# Patient Record
Sex: Female | Born: 1960 | Race: Black or African American | Hispanic: No | Smoking: Never smoker
Health system: Southern US, Community
[De-identification: ages and names within clinical notes are randomized; demographics above are authoritative.]

## PROBLEM LIST (undated history)

## (undated) DIAGNOSIS — E119 Type 2 diabetes mellitus without complications: Secondary | ICD-10-CM

## (undated) DIAGNOSIS — G971 Other reaction to spinal and lumbar puncture: Secondary | ICD-10-CM

## (undated) DIAGNOSIS — Z9861 Coronary angioplasty status: Secondary | ICD-10-CM

## (undated) DIAGNOSIS — T7840XA Allergy, unspecified, initial encounter: Secondary | ICD-10-CM

## (undated) DIAGNOSIS — K219 Gastro-esophageal reflux disease without esophagitis: Secondary | ICD-10-CM

## (undated) DIAGNOSIS — D649 Anemia, unspecified: Secondary | ICD-10-CM

## (undated) DIAGNOSIS — J302 Other seasonal allergic rhinitis: Secondary | ICD-10-CM

## (undated) DIAGNOSIS — K222 Esophageal obstruction: Secondary | ICD-10-CM

## (undated) DIAGNOSIS — I219 Acute myocardial infarction, unspecified: Secondary | ICD-10-CM

## (undated) DIAGNOSIS — K5790 Diverticulosis of intestine, part unspecified, without perforation or abscess without bleeding: Secondary | ICD-10-CM

## (undated) DIAGNOSIS — I10 Essential (primary) hypertension: Secondary | ICD-10-CM

## (undated) DIAGNOSIS — I2510 Atherosclerotic heart disease of native coronary artery without angina pectoris: Secondary | ICD-10-CM

## (undated) DIAGNOSIS — R569 Unspecified convulsions: Secondary | ICD-10-CM

## (undated) DIAGNOSIS — G43909 Migraine, unspecified, not intractable, without status migrainosus: Secondary | ICD-10-CM

## (undated) DIAGNOSIS — J42 Unspecified chronic bronchitis: Secondary | ICD-10-CM

## (undated) DIAGNOSIS — E785 Hyperlipidemia, unspecified: Secondary | ICD-10-CM

## (undated) DIAGNOSIS — M1990 Unspecified osteoarthritis, unspecified site: Secondary | ICD-10-CM

## (undated) DIAGNOSIS — K449 Diaphragmatic hernia without obstruction or gangrene: Secondary | ICD-10-CM

## (undated) DIAGNOSIS — J329 Chronic sinusitis, unspecified: Secondary | ICD-10-CM

## (undated) DIAGNOSIS — I252 Old myocardial infarction: Secondary | ICD-10-CM

## (undated) DIAGNOSIS — G4733 Obstructive sleep apnea (adult) (pediatric): Secondary | ICD-10-CM

## (undated) HISTORY — DX: Esophageal obstruction: K22.2

## (undated) HISTORY — DX: Obstructive sleep apnea (adult) (pediatric): G47.33

## (undated) HISTORY — DX: Allergy, unspecified, initial encounter: T78.40XA

## (undated) HISTORY — DX: Essential (primary) hypertension: I10

## (undated) HISTORY — PX: SINOSCOPY: SHX187

## (undated) HISTORY — DX: Old myocardial infarction: I25.2

## (undated) HISTORY — DX: Diaphragmatic hernia without obstruction or gangrene: K44.9

## (undated) HISTORY — DX: Diverticulosis of intestine, part unspecified, without perforation or abscess without bleeding: K57.90

## (undated) HISTORY — PX: COLONOSCOPY: SHX174

## (undated) HISTORY — PX: NASAL SEPTOPLASTY W/ TURBINOPLASTY: SHX2070

---

## 1987-06-30 HISTORY — PX: TUBAL LIGATION: SHX77

## 1992-06-29 HISTORY — PX: ABDOMINAL HYSTERECTOMY: SHX81

## 1999-10-15 ENCOUNTER — Ambulatory Visit: Admission: RE | Admit: 1999-10-15 | Discharge: 1999-10-15 | Payer: Self-pay | Admitting: Family Medicine

## 2002-08-30 ENCOUNTER — Other Ambulatory Visit: Admission: RE | Admit: 2002-08-30 | Discharge: 2002-08-30 | Payer: Self-pay | Admitting: Family Medicine

## 2002-09-01 ENCOUNTER — Ambulatory Visit (HOSPITAL_COMMUNITY): Admission: RE | Admit: 2002-09-01 | Discharge: 2002-09-01 | Payer: Self-pay | Admitting: Family Medicine

## 2003-10-25 ENCOUNTER — Other Ambulatory Visit: Admission: RE | Admit: 2003-10-25 | Discharge: 2003-10-25 | Payer: Self-pay | Admitting: Family Medicine

## 2003-11-01 ENCOUNTER — Ambulatory Visit (HOSPITAL_COMMUNITY): Admission: RE | Admit: 2003-11-01 | Discharge: 2003-11-01 | Payer: Self-pay | Admitting: Internal Medicine

## 2004-04-10 ENCOUNTER — Emergency Department (HOSPITAL_COMMUNITY): Admission: EM | Admit: 2004-04-10 | Discharge: 2004-04-10 | Payer: Self-pay | Admitting: Emergency Medicine

## 2004-05-01 ENCOUNTER — Ambulatory Visit: Payer: Self-pay | Admitting: Nurse Practitioner

## 2004-11-28 ENCOUNTER — Ambulatory Visit (HOSPITAL_COMMUNITY): Admission: RE | Admit: 2004-11-28 | Discharge: 2004-11-28 | Payer: Self-pay | Admitting: Family Medicine

## 2005-08-06 ENCOUNTER — Emergency Department (HOSPITAL_COMMUNITY): Admission: EM | Admit: 2005-08-06 | Discharge: 2005-08-06 | Payer: Self-pay | Admitting: Family Medicine

## 2006-01-20 ENCOUNTER — Ambulatory Visit: Payer: Self-pay | Admitting: *Deleted

## 2006-01-20 ENCOUNTER — Ambulatory Visit: Payer: Self-pay | Admitting: Nurse Practitioner

## 2006-01-29 ENCOUNTER — Ambulatory Visit: Payer: Self-pay | Admitting: Nurse Practitioner

## 2006-10-28 ENCOUNTER — Encounter: Payer: Self-pay | Admitting: Internal Medicine

## 2007-03-10 ENCOUNTER — Ambulatory Visit: Payer: Self-pay | Admitting: Internal Medicine

## 2007-03-10 LAB — CONVERTED CEMR LAB
ALT: 13 units/L (ref 0–35)
AST: 15 units/L (ref 0–37)
Albumin: 3.9 g/dL (ref 3.5–5.2)
Alkaline Phosphatase: 52 units/L (ref 39–117)
BUN: 9 mg/dL (ref 6–23)
Bacteria, UA: NEGATIVE
Basophils Absolute: 0.1 10*3/uL (ref 0.0–0.1)
Basophils Relative: 0.9 % (ref 0.0–1.0)
Bilirubin Urine: NEGATIVE
Bilirubin, Direct: 0.1 mg/dL (ref 0.0–0.3)
CO2: 26 meq/L (ref 19–32)
Calcium: 9.4 mg/dL (ref 8.4–10.5)
Chloride: 108 meq/L (ref 96–112)
Cholesterol: 269 mg/dL (ref 0–200)
Creatinine, Ser: 0.6 mg/dL (ref 0.4–1.2)
Crystals: NEGATIVE
Direct LDL: 206.1 mg/dL
Eosinophils Absolute: 0.2 10*3/uL (ref 0.0–0.6)
Eosinophils Relative: 2.7 % (ref 0.0–5.0)
GFR calc Af Amer: 139 mL/min
GFR calc non Af Amer: 115 mL/min
Glucose, Bld: 101 mg/dL — ABNORMAL HIGH (ref 70–99)
HCT: 34.8 % — ABNORMAL LOW (ref 36.0–46.0)
HDL: 39.4 mg/dL (ref >39.0)
Hemoglobin: 11.9 g/dL — ABNORMAL LOW (ref 12.0–15.0)
Ketones, ur: NEGATIVE mg/dL
Leukocytes, UA: NEGATIVE
Lymphocytes Relative: 32.7 % (ref 12.0–46.0)
MCHC: 34 g/dL (ref 30.0–36.0)
MCV: 83.4 fL (ref 78.0–100.0)
Monocytes Absolute: 0.6 10*3/uL (ref 0.2–0.7)
Monocytes Relative: 8.6 % (ref 3.0–11.0)
Mucus, UA: NEGATIVE
Neutro Abs: 3.7 10*3/uL (ref 1.4–7.7)
Neutrophils Relative %: 55.1 % (ref 43.0–77.0)
Nitrite: NEGATIVE
Platelets: 256 10*3/uL (ref 150–400)
Potassium: 3.8 meq/L (ref 3.5–5.1)
RBC: 4.18 M/uL (ref 3.87–5.11)
RDW: 12.8 % (ref 11.5–14.6)
Sodium: 139 meq/L (ref 135–145)
Specific Gravity, Urine: 1.01 (ref 1.000–1.03)
TSH: 2.09 microintl units/mL (ref 0.35–5.50)
Total Bilirubin: 1.3 mg/dL — ABNORMAL HIGH (ref 0.3–1.2)
Total CHOL/HDL Ratio: 6.8
Total Protein, Urine: NEGATIVE mg/dL
Total Protein: 7.2 g/dL (ref 6.0–8.3)
Triglycerides: 134 mg/dL (ref 0–149)
Urine Glucose: NEGATIVE mg/dL
Urobilinogen, UA: 1 (ref 0.0–1.0)
VLDL: 27 mg/dL (ref 0–40)
WBC, UA: NONE SEEN cells/hpf
WBC: 6.8 10*3/uL (ref 4.5–10.5)
pH: 6.5 (ref 5.0–8.0)

## 2007-03-16 ENCOUNTER — Encounter (INDEPENDENT_AMBULATORY_CARE_PROVIDER_SITE_OTHER): Payer: Self-pay | Admitting: *Deleted

## 2007-03-16 ENCOUNTER — Ambulatory Visit: Payer: Self-pay | Admitting: Internal Medicine

## 2007-03-16 DIAGNOSIS — G43009 Migraine without aura, not intractable, without status migrainosus: Secondary | ICD-10-CM | POA: Insufficient documentation

## 2007-03-17 ENCOUNTER — Encounter: Payer: Self-pay | Admitting: Internal Medicine

## 2007-03-17 DIAGNOSIS — J309 Allergic rhinitis, unspecified: Secondary | ICD-10-CM | POA: Insufficient documentation

## 2007-03-17 DIAGNOSIS — E785 Hyperlipidemia, unspecified: Secondary | ICD-10-CM | POA: Insufficient documentation

## 2007-03-17 DIAGNOSIS — I10 Essential (primary) hypertension: Secondary | ICD-10-CM | POA: Insufficient documentation

## 2007-03-17 DIAGNOSIS — R51 Headache: Secondary | ICD-10-CM | POA: Insufficient documentation

## 2007-03-17 DIAGNOSIS — R519 Headache, unspecified: Secondary | ICD-10-CM | POA: Insufficient documentation

## 2007-08-01 DIAGNOSIS — D259 Leiomyoma of uterus, unspecified: Secondary | ICD-10-CM | POA: Insufficient documentation

## 2007-08-02 ENCOUNTER — Ambulatory Visit: Payer: Self-pay | Admitting: Internal Medicine

## 2007-09-02 ENCOUNTER — Ambulatory Visit: Payer: Self-pay | Admitting: Internal Medicine

## 2007-09-02 DIAGNOSIS — G5600 Carpal tunnel syndrome, unspecified upper limb: Secondary | ICD-10-CM | POA: Insufficient documentation

## 2007-10-05 ENCOUNTER — Telehealth: Payer: Self-pay | Admitting: Internal Medicine

## 2007-10-12 ENCOUNTER — Encounter (INDEPENDENT_AMBULATORY_CARE_PROVIDER_SITE_OTHER): Payer: Self-pay | Admitting: *Deleted

## 2007-10-12 ENCOUNTER — Telehealth: Payer: Self-pay | Admitting: Internal Medicine

## 2007-10-24 ENCOUNTER — Ambulatory Visit: Payer: Self-pay | Admitting: Pulmonary Disease

## 2007-10-26 ENCOUNTER — Ambulatory Visit: Payer: Self-pay | Admitting: Cardiology

## 2007-10-26 ENCOUNTER — Telehealth: Payer: Self-pay | Admitting: Internal Medicine

## 2007-10-27 ENCOUNTER — Encounter: Payer: Self-pay | Admitting: Pulmonary Disease

## 2007-11-14 ENCOUNTER — Telehealth: Payer: Self-pay | Admitting: Internal Medicine

## 2007-12-01 ENCOUNTER — Telehealth: Payer: Self-pay | Admitting: Internal Medicine

## 2007-12-15 ENCOUNTER — Telehealth (INDEPENDENT_AMBULATORY_CARE_PROVIDER_SITE_OTHER): Payer: Self-pay | Admitting: *Deleted

## 2007-12-16 ENCOUNTER — Ambulatory Visit: Payer: Self-pay | Admitting: Internal Medicine

## 2008-01-05 ENCOUNTER — Telehealth: Payer: Self-pay | Admitting: Internal Medicine

## 2008-01-12 ENCOUNTER — Telehealth: Payer: Self-pay | Admitting: Internal Medicine

## 2008-01-14 ENCOUNTER — Emergency Department (HOSPITAL_COMMUNITY): Admission: EM | Admit: 2008-01-14 | Discharge: 2008-01-15 | Payer: Self-pay | Admitting: Emergency Medicine

## 2008-01-16 ENCOUNTER — Telehealth: Payer: Self-pay | Admitting: Internal Medicine

## 2008-01-18 ENCOUNTER — Ambulatory Visit: Payer: Self-pay | Admitting: Pulmonary Disease

## 2008-01-18 ENCOUNTER — Ambulatory Visit: Payer: Self-pay | Admitting: Internal Medicine

## 2008-03-20 ENCOUNTER — Telehealth: Payer: Self-pay | Admitting: Internal Medicine

## 2008-04-14 ENCOUNTER — Encounter: Payer: Self-pay | Admitting: Internal Medicine

## 2008-06-04 ENCOUNTER — Emergency Department (HOSPITAL_COMMUNITY): Admission: EM | Admit: 2008-06-04 | Discharge: 2008-06-04 | Payer: Self-pay | Admitting: Family Medicine

## 2009-02-11 ENCOUNTER — Ambulatory Visit: Payer: Self-pay | Admitting: Internal Medicine

## 2009-02-11 DIAGNOSIS — R252 Cramp and spasm: Secondary | ICD-10-CM | POA: Insufficient documentation

## 2009-02-11 LAB — CONVERTED CEMR LAB
BUN: 11 mg/dL (ref 6–23)
Bilirubin, Direct: 0.1 mg/dL (ref 0.0–0.3)
Chloride: 105 meq/L (ref 96–112)
Creatinine, Ser: 0.8 mg/dL (ref 0.4–1.2)
Direct LDL: 192.2 mg/dL
Glucose, Bld: 102 mg/dL — ABNORMAL HIGH (ref 70–99)
HDL: 40 mg/dL (ref >39.00)
Potassium: 3.8 meq/L (ref 3.5–5.1)
TSH: 2.03 microintl units/mL (ref 0.35–5.50)
Total Bilirubin: 1.2 mg/dL (ref 0.3–1.2)
Total CHOL/HDL Ratio: 7
Triglycerides: 212 mg/dL — ABNORMAL HIGH (ref 0.0–149.0)

## 2009-02-12 ENCOUNTER — Telehealth: Payer: Self-pay | Admitting: Internal Medicine

## 2009-02-18 ENCOUNTER — Telehealth: Payer: Self-pay | Admitting: Internal Medicine

## 2009-03-11 ENCOUNTER — Telehealth: Payer: Self-pay | Admitting: Internal Medicine

## 2009-03-26 ENCOUNTER — Telehealth: Payer: Self-pay | Admitting: Internal Medicine

## 2009-03-26 ENCOUNTER — Ambulatory Visit: Payer: Self-pay | Admitting: Internal Medicine

## 2009-03-26 DIAGNOSIS — J069 Acute upper respiratory infection, unspecified: Secondary | ICD-10-CM | POA: Insufficient documentation

## 2009-03-27 ENCOUNTER — Telehealth: Payer: Self-pay | Admitting: Internal Medicine

## 2009-04-02 ENCOUNTER — Telehealth: Payer: Self-pay | Admitting: Internal Medicine

## 2009-06-20 ENCOUNTER — Telehealth: Payer: Self-pay | Admitting: Internal Medicine

## 2009-06-21 ENCOUNTER — Ambulatory Visit: Payer: Self-pay | Admitting: Family Medicine

## 2009-06-29 ENCOUNTER — Emergency Department (HOSPITAL_COMMUNITY): Admission: EM | Admit: 2009-06-29 | Discharge: 2009-06-29 | Payer: Self-pay | Admitting: Family Medicine

## 2009-07-17 ENCOUNTER — Ambulatory Visit: Payer: Self-pay | Admitting: Internal Medicine

## 2009-07-17 DIAGNOSIS — J329 Chronic sinusitis, unspecified: Secondary | ICD-10-CM | POA: Insufficient documentation

## 2009-09-30 ENCOUNTER — Ambulatory Visit: Payer: Self-pay | Admitting: Internal Medicine

## 2009-11-22 ENCOUNTER — Ambulatory Visit: Payer: Self-pay | Admitting: Internal Medicine

## 2010-01-01 ENCOUNTER — Ambulatory Visit: Payer: Self-pay | Admitting: Internal Medicine

## 2010-01-01 DIAGNOSIS — R5381 Other malaise: Secondary | ICD-10-CM | POA: Insufficient documentation

## 2010-01-01 DIAGNOSIS — R42 Dizziness and giddiness: Secondary | ICD-10-CM | POA: Insufficient documentation

## 2010-01-01 DIAGNOSIS — R5383 Other fatigue: Secondary | ICD-10-CM

## 2010-01-01 LAB — CONVERTED CEMR LAB
Albumin: 4.3 g/dL (ref 3.5–5.2)
Alkaline Phosphatase: 61 units/L (ref 39–117)
Basophils Relative: 0.7 % (ref 0.0–3.0)
CO2: 26 meq/L (ref 19–32)
Chloride: 108 meq/L (ref 96–112)
Eosinophils Absolute: 0.2 10*3/uL (ref 0.0–0.7)
Glucose, Bld: 98 mg/dL (ref 70–99)
HCT: 38 % (ref 36.0–46.0)
Hemoglobin: 12.7 g/dL (ref 12.0–15.0)
Lymphocytes Relative: 33.9 % (ref 12.0–46.0)
Lymphs Abs: 2.7 10*3/uL (ref 0.7–4.0)
MCHC: 33.4 g/dL (ref 30.0–36.0)
MCV: 84.8 fL (ref 78.0–100.0)
Monocytes Absolute: 0.6 10*3/uL (ref 0.1–1.0)
Neutro Abs: 4.4 10*3/uL (ref 1.4–7.7)
Potassium: 4 meq/L (ref 3.5–5.1)
RBC: 4.48 M/uL (ref 3.87–5.11)
RDW: 13.9 % (ref 11.5–14.6)
Sodium: 141 meq/L (ref 135–145)
TSH: 2.83 microintl units/mL (ref 0.35–5.50)
Total Protein: 7.7 g/dL (ref 6.0–8.3)

## 2010-01-28 ENCOUNTER — Encounter: Payer: Self-pay | Admitting: Internal Medicine

## 2010-03-22 ENCOUNTER — Ambulatory Visit (HOSPITAL_COMMUNITY): Admission: RE | Admit: 2010-03-22 | Discharge: 2010-03-22 | Payer: Self-pay | Admitting: Family Medicine

## 2010-03-26 ENCOUNTER — Encounter: Payer: Self-pay | Admitting: Internal Medicine

## 2010-07-29 NOTE — Letter (Signed)
Summary: Email from patient  Email from patient   Imported By: Sherian Rein 01/31/2010 08:13:50  _____________________________________________________________________  External Attachment:    Type:   Image     Comment:   External Document

## 2010-07-29 NOTE — Letter (Signed)
Summary: [facility]   Imported By: Sherian Rein 03/31/2010 13:34:30  _____________________________________________________________________  External Attachment:    Type:   Image     Comment:   External Document
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
## 2010-07-29 NOTE — Assessment & Plan Note (Signed)
Summary: PER SARAH SCHED--BP ELEVATED--DIZZINESS--150/107-157/89--STC   Vital Signs:  Patient profile:   50 year old female Height:      70 inches (177.80 cm) Weight:      251.0 pounds (114.09 kg) O2 Sat:      98 % on Room air Temp:     98.5 degrees F (36.94 degrees C) oral Pulse rate:   75 / minute BP sitting:   124 / 82  (left arm) Cuff size:   large  Vitals Entered By: Orlan Leavens (January 01, 2010 3:36 PM)  O2 Flow:  Room air CC: Elevated BP & Dizziness Is Patient Diabetic? No Pain Assessment Patient in pain? no      Comments Pt states BP has been elevated. Last night bp was 157/90. Had it check week ago it was 157/107. She statees she is having dizziness now   Primary Care Provider:  Norins  CC:  Elevated BP & Dizziness.  History of Present Illness: c/o dizziness onset last night, but symptoms occur 1-2x/week for last 2-3 months assoc with high blood pressure readings and headaches -  no weakness or vision change - no ear pain or recent sinus/allergy symptoms no syncope or falls - seems related to "high stress" at job feels fatigued - no CP, HA or edema  HTN - reports compliance with ongoing medical treatment and no changes in medication dose or frequency.? if fatigue, cramps and dizziness are adverse side effects related to current therapy.  would like to try change in meds  Clinical Review Panels:  CBC   WBC:  6.8 (03/10/2007)   RBC:  4.18 (03/10/2007)   Hgb:  11.9 (03/10/2007)   Hct:  34.8 (03/10/2007)   Platelets:  256 (03/10/2007)   MCV  83.4 (03/10/2007)   MCHC  34.0 (03/10/2007)   RDW  12.8 (03/10/2007)   PMN:  55.1 (03/10/2007)   Lymphs:  32.7 (03/10/2007)   Monos:  8.6 (03/10/2007)   Eosinophils:  2.7 (03/10/2007)   Basophil:  0.9 (03/10/2007)  Complete Metabolic Panel   Glucose:  102 (02/11/2009)   Sodium:  142 (02/11/2009)   Potassium:  3.8 (02/11/2009)   Chloride:  105 (02/11/2009)   CO2:  31 (02/11/2009)   BUN:  11 (02/11/2009)  Creatinine:  0.8 (02/11/2009)   Albumin:  3.9 (02/11/2009)   Total Protein:  7.2 (02/11/2009)   Calcium:  9.5 (02/11/2009)   Total Bili:  1.2 (02/11/2009)   Alk Phos:  65 (02/11/2009)   SGPT (ALT):  16 (02/11/2009)   SGOT (AST):  18 (02/11/2009)   Current Medications (verified): 1)  Lovastatin 20 Mg Tabs (Lovastatin) .... 2 Tabs Q Pm 2)  Metoprolol Tartrate 50 Mg  Tabs (Metoprolol Tartrate) .[name].. 1 Once Daily 3)  Aleve 220 Mg  Tabs (Naproxen Sodium) .... As Directed As Needed 4)  Hydrochlorothiazide 12.5 Mg  Caps (Hydrochlorothiazide) .[name].. 1 By Mouth Once Daily 5)  Allegra-D 12 Hour 60-120 Mg Xr12h-Tab (Fexofenadine-Pseudoephedrine) .[name].. 1 By Mouth  Every Morning X 7days, Then As Needed For Neeze and Nasal Congestion  Allergies (verified): 1)  ! Penicillin  Past History:  Past Medical History: Hx of FIBROIDS, UTERUS (ICD-218.9) s/p partial hyst age 43 MIGRAINE, COMMON W/O INTRACTABLE MIGRAINE (ICD-346.10) HYPERTENSION (ICD-401.9) HYPERLIPIDEMIA (ICD-272.4)  HEADACHE (ICD-784.0) ALLERGIC RHINITIS (ICD-477.9)    Review of Systems  The patient denies weight loss, vision loss, chest pain, syncope, peripheral edema, and abdominal pain.    Physical Exam  General:  alert, well-developed, well-nourished, and cooperative to examination.  Eyes:  vision grossly intact; pupils equal, round and reactive to light.  conjunctiva and lids normal.   no nystagmus Lungs:  normal respiratory effort, no intercostal retractions or use of accessory muscles; normal breath sounds bilaterally - no crackles and no wheezes.    Heart:  normal rate, regular rhythm, no murmur, and no rub. BLE without edema. Neurologic:  alert & oriented X3 and cranial nerves II-XII symetrically intact.  strength normal in all extremities, sensation intact to light touch, and gait normal. speech fluent without dysarthria or aphasia; follows commands with good comprehension.  Psych:  Oriented X3, memory intact for recent  and remote, and normally interactive.     Impression & Recommendations:  Problem # 1:  DIZZINESS (ICD-780.4)  ?vol deplete with diuretic and heat - ?underlying depression - see next neuro and cv exam benign - check labs - try meclizine - erx done f/u PCP 2 weeks Patient to call to be seen if no improvement in 10-14 days, sooner if worse.   Her updated medication list for this problem includes:    Antivert 12.5 Mg Tabs (Meclizine hcl) .[name]... 1 by mouth every 4 hours as needed for dizzy symptoms  Orders: TLB-BMP (Basic Metabolic Panel-BMET) (80048-METABOL) TLB-CBC Platelet - w/Differential (85025-CBCD) TLB-Hepatic/Liver Function Pnl (80076-HEPATIC) TLB-TSH (Thyroid Stimulating Hormone) (84443-TSH)  Problem # 2:  FATIGUE (ICD-780.79)  ?bbloc effect or other med issue - ?depression (pt denies - "just stressed" check labs now -  change antiHTN - see next f/u PCP 2 weeks to cont review  Orders: TLB-BMP (Basic Metabolic Panel-BMET) (80048-METABOL) TLB-CBC Platelet - w/Differential (85025-CBCD) TLB-Hepatic/Liver Function Pnl (80076-HEPATIC) TLB-TSH (Thyroid Stimulating Hormone) (84443-TSH)  Problem # 3:  HYPERTENSION (ICD-401.9)  thouygh well controlled here, pt reports usual SBP 150s change to mod dose amlodipine - f/u 2 weeks with PCP The following medications were removed from the medication list:    Hydrochlorothiazide 12.5 Mg Caps (Hydrochlorothiazide) .[name]... 1 by mouth once daily Her updated medication list for this problem includes:    Amlodipine Besylate 5 Mg Tabs (Amlodipine besylate) .[name]... 1 by mouth once daily  BP today: 124/82 Prior BP: 128/82 (09/30/2009)  Labs Reviewed: K+: 3.8 (02/11/2009) Creat: : 0.8 (02/11/2009)   Chol: 264 (02/11/2009)   HDL: 40.00 (02/11/2009)   LDL: DEL (03/10/2007)   TG: 212.0 (02/11/2009)  Orders: Prescription Created Electronically [phone]) TLB-BMP (Basic Metabolic Panel-BMET) (80048-METABOL) TLB-CBC Platelet - w/Differential  (85025-CBCD) TLB-Hepatic/Liver Function Pnl (80076-HEPATIC) TLB-TSH (Thyroid Stimulating Hormone) (84443-TSH)  Complete Medication List: 1)  Lovastatin 20 Mg Tabs (Lovastatin) .... 2 tabs q pm 2)  Amlodipine Besylate 5 Mg Tabs (Amlodipine besylate) .[name].. 1 by mouth once daily 3)  Aleve 220 Mg Tabs (Naproxen sodium) .... As directed as needed 4)  Allegra-d 12 Hour 60-120 Mg Xr12h-tab (Fexofenadine-pseudoephedrine) .[name].. 1 by mouth  every morning x 7days, then as needed for neeze and nasal congestion 5)  Antivert 12.5 Mg Tabs (Meclizine hcl) .[name].. 1 by mouth every 4 hours as needed for dizzy symptoms  Patient Instructions: 1)  it was good to see you today. 2)  change blood pressure medication to amlodipine (stop the metoprolol and hydrochlorothiazide) 3)  try meclizine (antivert) for dizzy symptoms as needed - 4)  your prescriptions have been electronically submitted to your pharmacy. Please take as directed. Contact our office if you believe you're having problems with the medication(s).  5)  test(s) ordered today - your results will be posted on the phone tree for review in 48-72 hours from the time  of test completion; call [phone] and enter your 9 digit MRN (listed above on this page, just below your name); if any changes need to be made or there are abnormal results, you will be contacted directly.  6)  try magnesium for cramps 7)  Please schedule a follow-up appointment in 2 weeks with dr. Debby Bud to recheck blood pressure and review your symptoms, call sooner if problems.  Prescriptions: ANTIVERT 12.5 MG TABS (MECLIZINE HCL) 1 by mouth every 4 hours as needed for dizzy symptoms  #30 x 0   Entered and Authorized by:   Newt Lukes MD   Signed by:   Newt Lukes MD on 01/01/2010   Method used:   Electronically to        [facility] [phone]* (retail)       [address]       [place], [place]  [address]       Ph: [phone]       Fax: [phone]   RxID:    [id number] AMLODIPINE BESYLATE 5 MG TABS (AMLODIPINE BESYLATE) 1 by mouth once daily  #30 x 1   Entered and Authorized by:   Newt Lukes MD   Signed by:   Newt Lukes MD on 01/01/2010   Method used:   Electronically to        [facility] [phone]* (retail)       [address]       [place], [place]  [address]       Ph: [phone]       Fax: [phone]   RxID:   [id number]
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
## 2010-08-19 ENCOUNTER — Telehealth (INDEPENDENT_AMBULATORY_CARE_PROVIDER_SITE_OTHER): Payer: Self-pay | Admitting: *Deleted

## 2010-08-26 NOTE — Progress Notes (Signed)
  Phone Note Other Incoming   Request: Send information Summary of Call: Request for records received from [facility] Request forwarded to [facility]. [facility]

## 2010-08-27 ENCOUNTER — Emergency Department (HOSPITAL_COMMUNITY)
Admission: EM | Admit: 2010-08-27 | Discharge: 2010-08-27 | Disposition: A | Discharge status: Home / Self Care | Payer: 59 | Attending: Emergency Medicine | Admitting: Emergency Medicine

## 2010-08-27 ENCOUNTER — Emergency Department (HOSPITAL_COMMUNITY): Payer: 59

## 2010-08-27 DIAGNOSIS — J40 Bronchitis, not specified as acute or chronic: Secondary | ICD-10-CM | POA: Insufficient documentation

## 2010-08-27 DIAGNOSIS — R079 Chest pain, unspecified: Secondary | ICD-10-CM | POA: Insufficient documentation

## 2010-08-27 DIAGNOSIS — R0602 Shortness of breath: Secondary | ICD-10-CM | POA: Insufficient documentation

## 2010-08-27 DIAGNOSIS — Z79899 Other long term (current) drug therapy: Secondary | ICD-10-CM | POA: Insufficient documentation

## 2010-08-27 DIAGNOSIS — R05 Cough: Secondary | ICD-10-CM | POA: Insufficient documentation

## 2010-08-27 DIAGNOSIS — R059 Cough, unspecified: Secondary | ICD-10-CM | POA: Insufficient documentation

## 2010-08-27 DIAGNOSIS — I10 Essential (primary) hypertension: Secondary | ICD-10-CM | POA: Insufficient documentation

## 2010-08-27 LAB — POCT CARDIAC MARKERS
CKMB, poc: <1 ng/mL — ABNORMAL LOW (ref 1.0–8.0)
Troponin i, poc: <0.05 ng/mL (ref 0.00–0.09)

## 2010-08-27 LAB — DIFFERENTIAL
Basophils Absolute: 0.1 10*3/uL (ref 0.0–0.1)
Basophils Relative: 1 % (ref 0–1)
Eosinophils Relative: 4 % (ref 0–5)
Monocytes Absolute: 0.9 10*3/uL (ref 0.1–1.0)
Neutro Abs: 7.4 10*3/uL (ref 1.7–7.7)

## 2010-08-27 LAB — BASIC METABOLIC PANEL
Calcium: 9 mg/dL (ref 8.4–10.5)
GFR calc Af Amer: >60 mL/min (ref >60)
GFR calc non Af Amer: >60 mL/min (ref >60)
Sodium: 137 mEq/L (ref 135–145)

## 2010-08-27 LAB — CBC
Hemoglobin: 12.2 g/dL (ref 12.0–15.0)
MCHC: 32.7 g/dL (ref 30.0–36.0)
RDW: 13.6 % (ref 11.5–15.5)
WBC: 11.1 10*3/uL — ABNORMAL HIGH (ref 4.0–10.5)

## 2010-11-11 NOTE — Assessment & Plan Note (Signed)
[facility]                                 ON-CALL NOTE   NAME:[name], Christine Barrett                         MRN:          [id number]  DATE:01/14/2008                            DOB:          [birth year]    The patient calls in [phone] at 08:02 p.m. on January 14, 2008,  complaining of headache and increased blood pressure.  She states the  headache is the worst headache she has ever had.  She is the patient of  Dr. Debby Bud.  I recommended she go to the emergency room to be evaluated.     Lelon Perla, DO  Electronically Signed    Shawnie Dapper  DD: 01/14/2008  DT: 01/15/2008  Job #: [id number]   cc:   Rosalyn Gess. [place], MD

## 2011-03-27 LAB — DIFFERENTIAL
Basophils Relative: 0
Lymphs Abs: 3.1
Monocytes Relative: 10
Neutro Abs: 4.2
Neutrophils Relative %: 50

## 2011-03-27 LAB — CBC
MCHC: 33.2
RBC: 4.16
WBC: 8.5

## 2011-03-27 LAB — POCT I-STAT, CHEM 8
Chloride: 108
Glucose, Bld: 105 — ABNORMAL HIGH
HCT: 36
Potassium: 3.8
Sodium: 140

## 2012-11-03 ENCOUNTER — Emergency Department (HOSPITAL_COMMUNITY)
Admission: EM | Admit: 2012-11-03 | Discharge: 2012-11-03 | Disposition: A | Discharge status: Home / Self Care | Payer: Self-pay | Source: Home / Self Care | Attending: Emergency Medicine | Admitting: Emergency Medicine

## 2012-11-03 ENCOUNTER — Encounter (HOSPITAL_COMMUNITY): Payer: Self-pay | Admitting: Emergency Medicine

## 2012-11-03 DIAGNOSIS — R0982 Postnasal drip: Secondary | ICD-10-CM

## 2012-11-03 DIAGNOSIS — R059 Cough, unspecified: Secondary | ICD-10-CM

## 2012-11-03 DIAGNOSIS — R05 Cough: Secondary | ICD-10-CM

## 2012-11-03 DIAGNOSIS — J329 Chronic sinusitis, unspecified: Secondary | ICD-10-CM

## 2012-11-03 HISTORY — DX: Chronic sinusitis, unspecified: J32.9

## 2012-11-03 HISTORY — DX: Hyperlipidemia, unspecified: E78.5

## 2012-11-03 HISTORY — DX: Other seasonal allergic rhinitis: J30.2

## 2012-11-03 MED ORDER — CETIRIZINE-PSEUDOEPHEDRINE ER 5-120 MG PO TB12: 1.0000 | ORAL_TABLET | Freq: Two times a day (BID) | ORAL | Status: DC

## 2012-11-03 MED ORDER — FLUTICASONE PROPIONATE 50 MCG/ACT NA SUSP: 2.0000 | Freq: Every day | NASAL | Status: DC

## 2012-11-03 MED ORDER — AZITHROMYCIN 250 MG PO TABS: 250.0000 mg | ORAL_TABLET | Freq: Every day | ORAL | Status: DC

## 2012-11-03 NOTE — ED Provider Notes (Signed)
History     CSN: [id number]  Arrival date & time 11/03/12  1101   First MD Initiated Contact with Patient 11/03/12 1125      Chief Complaint  Patient presents with  . Recurrent Sinusitis    (Consider location/radiation/quality/duration/timing/severity/associated sxs/prior treatment) HPI Comments: Patient presents urgent care describing for the last 2 days she's been having sinus pressure as well as postnasal drainage and a congested nose. Some sporadic sneezing. She does describe that she has allergies he was unable to buy more Zyrtec as he was out of money. 2008 patient had a nasal septal surgery with a local ENT provider. Patient denies any fevers but does describe for the last 2 days been coughing and now is coughing up yellow phlegm. She does have postnasal drainage and dripping that his bothering her throughout the course of the day. Patient denies any shortness of breath, fevers ear pain or wheezing. Denies any dizziness or vertigo.  Patient is a 52 y.o. female presenting with cough. The history is provided by the patient.  Cough Cough characteristics:  Productive Sputum characteristics:  Clear and yellow Severity:  Moderate Onset quality:  Gradual Duration:  2 days Timing:  Constant Progression:  Worsening Chronicity:  New Smoker: no   Context: exposure to allergens and weather changes   Context: not sick contacts, not smoke exposure and not upper respiratory infection   Exacerbated by: alkarseltzer. Associated symptoms: rhinorrhea, sinus congestion and sore throat   Associated symptoms: no chest pain, no chills, no diaphoresis, no ear fullness, no ear pain, no fever, no headaches, no rash and no wheezing     Past Medical History  Diagnosis Date  . Hypertension   . Hyperlipemia   . Seasonal allergies   . Sinusitis     Past Surgical History  Procedure Laterality Date  . Cesarean section    . Septoplasty    . Nasal turbinate reduction    . Partial hysterectomy       No family history on file.  History  Substance Use Topics  . Smoking status: Never Smoker   . Smokeless tobacco: Not on file  . Alcohol Use: No    OB History   Grav Para Term Preterm Abortions TAB SAB Ect Mult Living                  Review of Systems  Constitutional: Negative for fever, chills, diaphoresis, activity change and fatigue.  HENT: Positive for congestion, sore throat, rhinorrhea, sneezing, postnasal drip and sinus pressure. Negative for hearing loss, ear pain, nosebleeds, neck pain, neck stiffness, dental problem, voice change, tinnitus and ear discharge.   Respiratory: Positive for cough. Negative for apnea, chest tightness and wheezing.   Cardiovascular: Negative for chest pain.  Gastrointestinal: Negative for vomiting.  Skin: Negative for color change, rash and wound.  Allergic/Immunologic: Positive for environmental allergies.  Neurological: Negative for headaches.    Allergies  Penicillins  Home Medications   Current Outpatient Rx  Name  Route  Sig  Dispense  Refill  . azithromycin (ZITHROMAX) 250 MG tablet   Oral   Take 1 tablet (250 mg total) by mouth daily. Take first 2 tablets together, then 1 every day until finished.   6 tablet   0   . cetirizine-pseudoephedrine (ZYRTEC-D) 5-120 MG per tablet   Oral   Take 1 tablet by mouth 2 (two) times daily.   60 tablet   0   . fluticasone (FLONASE) 50 MCG/ACT nasal spray  Nasal   Place 2 sprays into the nose daily.   16 g   2     BP 175/85  Pulse 91  Temp(Src) 98.2 F (36.8 C) (Oral)  Resp 16  SpO2 100%  Physical Exam  Nursing note and vitals reviewed. Constitutional: She appears well-developed and well-nourished.  Non-toxic appearance. She does not have a sickly appearance. She does not appear ill. No distress.  HENT:  Head: Normocephalic.  Right Ear: Hearing, tympanic membrane and ear canal normal.  Left Ear: Hearing, tympanic membrane and ear canal normal.  Nose: Mucosal edema  and rhinorrhea present. No sinus tenderness, nasal deformity, septal deviation or nasal septal hematoma. No epistaxis.  Mouth/Throat: Uvula is midline and mucous membranes are normal. No oropharyngeal exudate, posterior oropharyngeal edema or tonsillar abscesses.  Eyes: Conjunctivae are normal. Pupils are equal, round, and reactive to light.  Neck: Neck supple. No JVD present.  Cardiovascular: Normal rate.  Exam reveals no gallop and no friction rub.   No murmur heard. Pulmonary/Chest: Effort normal and breath sounds normal. She has no decreased breath sounds. She has no wheezes.  Skin: No rash noted. No erythema.    ED Course  Procedures (including critical care time)  Labs Reviewed - No data to display No results found.   1. Sinusitis   2. Postnasal drip   3. Cough       MDM  Sinus congestion and postnasal drainage with a reactive cough. Patient was instructed to start with Zyrtec-D and Flonase and to use his medicines consistently for the next 3-5 days if her symptoms were to exacerbate or no improvement have provided her with a anticipatory antibiotic prescription with azithromycin. Patient agrees with current treatment plan and followup care as necessary and would not take antibiotics unless she does not respond to recommended initial treatment        Jimmie Molly, MD 11/03/12 1158

## 2012-11-03 NOTE — ED Notes (Signed)
Pt c/o sinus pressure x 2 days. This is a recurrent problem that happens every year. Sx include runny nose, sneezing, productive cough with yellowish phelgm. No fever or n/v. Has tried alka selter plus with no relief. Patient is alert and oriented.

## 2012-11-22 ENCOUNTER — Ambulatory Visit: Payer: Self-pay

## 2013-03-24 ENCOUNTER — Ambulatory Visit: Payer: Self-pay

## 2013-09-27 DIAGNOSIS — Z9861 Coronary angioplasty status: Secondary | ICD-10-CM

## 2013-09-27 DIAGNOSIS — I2510 Atherosclerotic heart disease of native coronary artery without angina pectoris: Secondary | ICD-10-CM

## 2013-09-27 HISTORY — DX: Coronary angioplasty status: Z98.61

## 2013-09-27 HISTORY — DX: Atherosclerotic heart disease of native coronary artery without angina pectoris: I25.10

## 2013-10-01 ENCOUNTER — Inpatient Hospital Stay (HOSPITAL_COMMUNITY)
Admission: EM | Admit: 2013-10-01 | Discharge: 2013-10-04 | DRG: 251 | Disposition: A | Discharge status: Home / Self Care | Payer: Self-pay | Attending: Internal Medicine | Admitting: Internal Medicine

## 2013-10-01 ENCOUNTER — Emergency Department (HOSPITAL_COMMUNITY): Payer: Self-pay

## 2013-10-01 ENCOUNTER — Encounter (HOSPITAL_COMMUNITY): Payer: Self-pay | Admitting: Emergency Medicine

## 2013-10-01 DIAGNOSIS — I2510 Atherosclerotic heart disease of native coronary artery without angina pectoris: Secondary | ICD-10-CM

## 2013-10-01 DIAGNOSIS — J309 Allergic rhinitis, unspecified: Secondary | ICD-10-CM

## 2013-10-01 DIAGNOSIS — I214 Non-ST elevation (NSTEMI) myocardial infarction: Secondary | ICD-10-CM

## 2013-10-01 DIAGNOSIS — I10 Essential (primary) hypertension: Secondary | ICD-10-CM

## 2013-10-01 DIAGNOSIS — E785 Hyperlipidemia, unspecified: Secondary | ICD-10-CM

## 2013-10-01 DIAGNOSIS — I160 Hypertensive urgency: Secondary | ICD-10-CM

## 2013-10-01 DIAGNOSIS — R51 Headache: Secondary | ICD-10-CM

## 2013-10-01 DIAGNOSIS — I252 Old myocardial infarction: Secondary | ICD-10-CM | POA: Diagnosis present

## 2013-10-01 DIAGNOSIS — J069 Acute upper respiratory infection, unspecified: Secondary | ICD-10-CM

## 2013-10-01 DIAGNOSIS — I119 Hypertensive heart disease without heart failure: Secondary | ICD-10-CM

## 2013-10-01 DIAGNOSIS — R42 Dizziness and giddiness: Secondary | ICD-10-CM

## 2013-10-01 DIAGNOSIS — R252 Cramp and spasm: Secondary | ICD-10-CM

## 2013-10-01 DIAGNOSIS — Z9861 Coronary angioplasty status: Secondary | ICD-10-CM

## 2013-10-01 DIAGNOSIS — Z91199 Patient's noncompliance with other medical treatment and regimen due to unspecified reason: Secondary | ICD-10-CM

## 2013-10-01 DIAGNOSIS — D649 Anemia, unspecified: Secondary | ICD-10-CM

## 2013-10-01 DIAGNOSIS — R5381 Other malaise: Secondary | ICD-10-CM

## 2013-10-01 DIAGNOSIS — R079 Chest pain, unspecified: Secondary | ICD-10-CM

## 2013-10-01 DIAGNOSIS — Z880 Allergy status to penicillin: Secondary | ICD-10-CM

## 2013-10-01 DIAGNOSIS — D259 Leiomyoma of uterus, unspecified: Secondary | ICD-10-CM

## 2013-10-01 DIAGNOSIS — G43009 Migraine without aura, not intractable, without status migrainosus: Secondary | ICD-10-CM

## 2013-10-01 DIAGNOSIS — H6690 Otitis media, unspecified, unspecified ear: Secondary | ICD-10-CM | POA: Diagnosis present

## 2013-10-01 DIAGNOSIS — R5383 Other fatigue: Secondary | ICD-10-CM

## 2013-10-01 DIAGNOSIS — I161 Hypertensive emergency: Secondary | ICD-10-CM

## 2013-10-01 DIAGNOSIS — Z9119 Patient's noncompliance with other medical treatment and regimen: Secondary | ICD-10-CM

## 2013-10-01 DIAGNOSIS — G5600 Carpal tunnel syndrome, unspecified upper limb: Secondary | ICD-10-CM

## 2013-10-01 DIAGNOSIS — J329 Chronic sinusitis, unspecified: Secondary | ICD-10-CM

## 2013-10-01 HISTORY — DX: Anemia, unspecified: D64.9

## 2013-10-01 HISTORY — DX: Old myocardial infarction: I25.2

## 2013-10-01 HISTORY — DX: Migraine, unspecified, not intractable, without status migrainosus: G43.909

## 2013-10-01 HISTORY — DX: Coronary angioplasty status: Z98.61

## 2013-10-01 HISTORY — DX: Atherosclerotic heart disease of native coronary artery without angina pectoris: I25.10

## 2013-10-01 HISTORY — DX: Unspecified chronic bronchitis: J42

## 2013-10-01 LAB — URINALYSIS, ROUTINE W REFLEX MICROSCOPIC
BILIRUBIN URINE: NEGATIVE
GLUCOSE, UA: NEGATIVE mg/dL
KETONES UR: 15 mg/dL — AB
Leukocytes, UA: NEGATIVE
Nitrite: NEGATIVE
PROTEIN: NEGATIVE mg/dL
Specific Gravity, Urine: 1.028 (ref 1.005–1.030)
Urobilinogen, UA: 1 mg/dL (ref 0.0–1.0)
pH: 7 (ref 5.0–8.0)

## 2013-10-01 LAB — BASIC METABOLIC PANEL
BUN: 10 mg/dL (ref 6–23)
CALCIUM: 10 mg/dL (ref 8.4–10.5)
CO2: 22 mEq/L (ref 19–32)
CREATININE: 0.71 mg/dL (ref 0.50–1.10)
Chloride: 102 mEq/L (ref 96–112)
Glucose, Bld: 102 mg/dL — ABNORMAL HIGH (ref 70–99)
Potassium: 4.3 mEq/L (ref 3.7–5.3)
SODIUM: 140 meq/L (ref 137–147)

## 2013-10-01 LAB — POC URINE PREG, ED: PREG TEST UR: NEGATIVE

## 2013-10-01 LAB — CBC
HCT: 38.1 % (ref 36.0–46.0)
Hemoglobin: 12.3 g/dL (ref 12.0–15.0)
MCH: 26.7 pg (ref 26.0–34.0)
MCHC: 32.3 g/dL (ref 30.0–36.0)
MCV: 82.6 fL (ref 78.0–100.0)
PLATELETS: 297 10*3/uL (ref 150–400)
RBC: 4.61 MIL/uL (ref 3.87–5.11)
RDW: 14 % (ref 11.5–15.5)
WBC: 14.3 10*3/uL — AB (ref 4.0–10.5)

## 2013-10-01 LAB — I-STAT TROPONIN, ED: Troponin i, poc: 0 ng/mL (ref 0.00–0.08)

## 2013-10-01 LAB — URINE MICROSCOPIC-ADD ON

## 2013-10-01 LAB — MRSA PCR SCREENING: MRSA by PCR: NEGATIVE

## 2013-10-01 LAB — TROPONIN I: Troponin I: 3.66 ng/mL (ref <0.30)

## 2013-10-01 LAB — PRO B NATRIURETIC PEPTIDE: PRO B NATRI PEPTIDE: 98.9 pg/mL (ref 0–125)

## 2013-10-01 MED ORDER — ACETAMINOPHEN 650 MG RE SUPP: 650.0000 mg | Freq: Four times a day (QID) | RECTAL | Status: DC | PRN

## 2013-10-01 MED ORDER — LISINOPRIL 10 MG PO TABS
10.0000 mg | ORAL_TABLET | Freq: Every day | ORAL | Status: DC
Start: 2013-10-02 — End: 2013-10-04
  Administered 2013-10-02 – 2013-10-03 (×3): 10 mg via ORAL
  Filled 2013-10-01 (×5): qty 1

## 2013-10-01 MED ORDER — SODIUM CHLORIDE 0.9 % IJ SOLN
3.0000 mL | Freq: Two times a day (BID) | INTRAMUSCULAR | Status: DC
  Administered 2013-10-01 – 2013-10-02 (×2): 3 mL via INTRAVENOUS

## 2013-10-01 MED ORDER — ACETAMINOPHEN 325 MG PO TABS
650.0000 mg | ORAL_TABLET | Freq: Four times a day (QID) | ORAL | Status: DC | PRN
  Administered 2013-10-01: 650 mg via ORAL
  Filled 2013-10-01: qty 2

## 2013-10-01 MED ORDER — ONDANSETRON HCL 4 MG/2ML IJ SOLN
4.0000 mg | Freq: Four times a day (QID) | INTRAMUSCULAR | Status: DC | PRN
  Administered 2013-10-01 – 2013-10-03 (×4): 4 mg via INTRAVENOUS
  Filled 2013-10-01 (×4): qty 2

## 2013-10-01 MED ORDER — NITROGLYCERIN 0.4 MG SL SUBL
SUBLINGUAL_TABLET | SUBLINGUAL | Status: AC
  Filled 2013-10-01: qty 1

## 2013-10-01 MED ORDER — MORPHINE SULFATE 4 MG/ML IJ SOLN
4.0000 mg | Freq: Once | INTRAMUSCULAR | Status: AC
  Administered 2013-10-01: 4 mg via INTRAVENOUS
  Filled 2013-10-01: qty 1

## 2013-10-01 MED ORDER — CARVEDILOL 12.5 MG PO TABS
12.5000 mg | ORAL_TABLET | Freq: Two times a day (BID) | ORAL | Status: DC
  Administered 2013-10-02 – 2013-10-03 (×3): 12.5 mg via ORAL
  Filled 2013-10-01 (×8): qty 1

## 2013-10-01 MED ORDER — LORAZEPAM 1 MG PO TABS
1.0000 mg | ORAL_TABLET | Freq: Two times a day (BID) | ORAL | Status: DC | PRN
  Administered 2013-10-01: 1 mg via ORAL
  Filled 2013-10-01: qty 1

## 2013-10-01 MED ORDER — ONDANSETRON HCL 4 MG PO TABS
4.0000 mg | ORAL_TABLET | Freq: Four times a day (QID) | ORAL | Status: DC | PRN
  Administered 2013-10-02: 4 mg via ORAL
  Filled 2013-10-01: qty 1

## 2013-10-01 MED ORDER — MORPHINE SULFATE 2 MG/ML IJ SOLN
2.0000 mg | INTRAMUSCULAR | Status: DC | PRN
  Administered 2013-10-01: 2 mg via INTRAVENOUS
  Filled 2013-10-01: qty 1

## 2013-10-01 MED ORDER — SIMVASTATIN 40 MG PO TABS
40.0000 mg | ORAL_TABLET | Freq: Every day | ORAL | Status: DC
  Administered 2013-10-01: 40 mg via ORAL
  Filled 2013-10-01 (×2): qty 1

## 2013-10-01 MED ORDER — ENOXAPARIN SODIUM 120 MG/0.8ML ~~LOC~~ SOLN
115.0000 mg | Freq: Two times a day (BID) | SUBCUTANEOUS | Status: DC
  Administered 2013-10-01 – 2013-10-02 (×2): 115 mg via SUBCUTANEOUS
  Filled 2013-10-01 (×3): qty 0.8

## 2013-10-01 MED ORDER — ASPIRIN 325 MG PO TABS
325.0000 mg | ORAL_TABLET | ORAL | Status: AC
  Administered 2013-10-01: 325 mg via ORAL
  Filled 2013-10-01: qty 1

## 2013-10-01 MED ORDER — SODIUM CHLORIDE 0.9 % IV BOLUS (SEPSIS)
500.0000 mL | Freq: Once | INTRAVENOUS | Status: AC
  Administered 2013-10-01: 500 mL via INTRAVENOUS

## 2013-10-01 MED ORDER — NITROGLYCERIN 0.4 MG SL SUBL
0.4000 mg | SUBLINGUAL_TABLET | SUBLINGUAL | Status: DC | PRN
  Administered 2013-10-01: 0.4 mg via SUBLINGUAL

## 2013-10-01 MED ORDER — FLUTICASONE PROPIONATE 50 MCG/ACT NA SUSP
1.0000 | Freq: Two times a day (BID) | NASAL | Status: DC
  Administered 2013-10-02: 1 via NASAL
  Filled 2013-10-01 (×2): qty 16

## 2013-10-01 MED ORDER — SODIUM CHLORIDE 0.9 % IV SOLN: INTRAVENOUS | Status: DC

## 2013-10-01 MED ORDER — CARVEDILOL 6.25 MG PO TABS
6.2500 mg | ORAL_TABLET | Freq: Two times a day (BID) | ORAL | Status: DC
  Filled 2013-10-01: qty 1

## 2013-10-01 MED ORDER — CARVEDILOL 6.25 MG PO TABS
6.2500 mg | ORAL_TABLET | Freq: Once | ORAL | Status: DC
  Filled 2013-10-01: qty 1

## 2013-10-01 MED ORDER — PANTOPRAZOLE SODIUM 40 MG PO TBEC
40.0000 mg | DELAYED_RELEASE_TABLET | Freq: Every day | ORAL | Status: DC
  Administered 2013-10-01 – 2013-10-04 (×4): 40 mg via ORAL
  Filled 2013-10-01 (×4): qty 1

## 2013-10-01 MED ORDER — ASPIRIN 325 MG PO TABS
325.0000 mg | ORAL_TABLET | Freq: Every day | ORAL | Status: DC
  Administered 2013-10-01 – 2013-10-02 (×2): 325 mg via ORAL
  Filled 2013-10-01 (×2): qty 1

## 2013-10-01 MED ORDER — IOHEXOL 350 MG/ML SOLN
100.0000 mL | Freq: Once | INTRAVENOUS | Status: AC | PRN
  Administered 2013-10-01: 100 mL via INTRAVENOUS

## 2013-10-01 MED ORDER — CARVEDILOL 6.25 MG PO TABS
6.2500 mg | ORAL_TABLET | ORAL | Status: AC
  Administered 2013-10-01: 6.25 mg via ORAL
  Filled 2013-10-01: qty 1

## 2013-10-01 MED ORDER — NITROGLYCERIN IN D5W 200-5 MCG/ML-% IV SOLN
2.0000 ug/min | INTRAVENOUS | Status: DC
Start: 2013-10-01 — End: 2013-10-03
  Administered 2013-10-01: 20 ug/min via INTRAVENOUS
  Administered 2013-10-02: 60 ug/min via INTRAVENOUS
  Filled 2013-10-01 (×2): qty 250

## 2013-10-01 MED ORDER — CARVEDILOL 6.25 MG PO TABS
6.2500 mg | ORAL_TABLET | ORAL | Status: AC
  Administered 2013-10-01: 6.25 mg via ORAL

## 2013-10-01 MED ORDER — MORPHINE SULFATE 2 MG/ML IJ SOLN
2.0000 mg | INTRAMUSCULAR | Status: DC | PRN
  Administered 2013-10-02: 2 mg via INTRAVENOUS
  Filled 2013-10-01: qty 1

## 2013-10-01 MED ORDER — AMLODIPINE BESYLATE 5 MG PO TABS
5.0000 mg | ORAL_TABLET | Freq: Every day | ORAL | Status: DC
  Administered 2013-10-01 – 2013-10-04 (×4): 5 mg via ORAL
  Filled 2013-10-01 (×4): qty 1

## 2013-10-01 NOTE — Progress Notes (Signed)
CRITICAL VALUE ALERT  Critical value received:  Troponin 3.66  Date of notification: 10/01/2013  Time of notification:  2200  Critical value read back yes  Nurse who received alert:  C. Suella Broad RN  MD notified (1st page):  [facility] [place]  Time of first page:  2210  MD notified (2nd page):none  Time of second page:none  Responding MD:  [place]  Time MD responded:  2214

## 2013-10-01 NOTE — Progress Notes (Signed)
Pt was admitted to 1222 via bed from ED on 1mcg of NTG.  Chest Pain 4/10 and radiating to left arm.  Hypertensive.  Since that time pt has had pain increasing to 5/10 after pain med.  NTG currently at 50 mcg. BP 180/88. Positive troponin 3.66 called to [facility] NP.  EKG pending, and pharmacy to dose heparin, cardiology consult pending.

## 2013-10-01 NOTE — Consult Note (Signed)
CARDIOLOGY CONSULT NOTE  Patient ID: Christine Barrett  MRN: [id number]  DOB: [birth year]  Admit date: 10/01/2013 Date of Consult: 10/01/2013  Primary Cardiologist: None  Reason for Consultation: Concern for unstable angina in setting of hypertensive urgency.  History of Present Illness: Christine Barrett is a 53 y.o. female with PMHx of HTN and Hyperlipidemia, who presented to the [facility] ED this afternoon with substernal chest pain starting while sitting at church, with radiation to L arm.  Pressure like pain.  This is new for her.  No SOB or diaphoresis.  Denies exertional chest pain but has DOE.  In ED her BP was 233/120, and was started on IV NTG with improvement in both BP and CP.   Initial troponin negative but second set elevated.  Patient has been off meds for 1 year due to lack of insurance, but she was seen in urgent care for an ear infection yesterday and BP was 190/110 there.  Past Medical History  Diagnosis Date  . Hypertension   . Hyperlipemia   . Seasonal allergies   . Sinusitis     Past Surgical History  Procedure Laterality Date  . Cesarean section    . Septoplasty    . Nasal turbinate reduction    . Partial hysterectomy       Home Meds: Prior to Admission medications   Medication Sig Start Date End Date Taking? Authorizing Provider  fluticasone (FLONASE) 50 MCG/ACT nasal spray Place 1 spray into both nostrils 2 (two) times daily.   Yes Historical Provider, MD  lisinopril-hydrochlorothiazide (PRINZIDE,ZESTORETIC) 20-25 MG per tablet Take 1 tablet by mouth daily.   Yes Historical Provider, MD    Current Medications: . amLODipine  5 mg Oral Daily  . [START ON 10/02/2013] carvedilol  6.25 mg Oral BID WC  . carvedilol  6.25 mg Oral NOW     Allergies:    Allergies  Allergen Reactions  . Penicillins Rash    Pt states she has taken since intial  reaction without recurrence.     Social History:   The patient  reports that she has never smoked. She does not have any smokeless  tobacco history on file. She reports that she does not drink alcohol or use illicit drugs.    Family History:   The patient's family history is not on file.   ROS:  Please see the history of present illness.    All other systems reviewed and negative.   Vital Signs: Blood pressure 164/90, pulse 85, temperature 98 F (36.7 C), temperature source Oral, resp. rate 16, height 5\' 10"  (1.778 m), weight 114.2 kg (251 lb 12.3 oz), SpO2 95.00%.   PHYSICAL EXAM: General:  Well nourished, well developed, in no acute distress HEENT: normal Lymph: no adenopathy Neck: no JVD Endocrine:  No thryomegaly Vascular: No carotid bruits; FA pulses 2+ bilaterally without bruits Cardiac:  normal S1, S2; RRR; no murmur Lungs:  clear to auscultation bilaterally, no wheezing, rhonchi or rales Abd: soft, nontender, no hepatomegaly Ext: no edema Musculoskeletal:  No deformities, BUE and BLE strength normal and equal Skin: warm and dry Neuro:  CNs 2-12 intact, no focal abnormalities noted Psych:  Normal affect   EKG:  Normal sinus rhythm.  Inferior and lateral concave upward ST elevation, less than 55mm.  Improved on second EKG with improved blood pressure.  Labs: No results found for this basename: CKTOTAL, CKMB, TROPONINI,  in the last 72 hours Lab Results  Component Value Date   WBC  14.3* 10/01/2013   HGB 12.3 10/01/2013   HCT 38.1 10/01/2013   MCV 82.6 10/01/2013   PLT 297 10/01/2013    Recent Labs Lab 10/01/13 1522  NA 140  K 4.3  CL 102  CO2 22  BUN 10  CREATININE 0.71  CALCIUM 10.0  GLUCOSE 102*   Lab Results  Component Value Date   CHOL 264* 02/11/2009   HDL 40.00 02/11/2009   TRIG 212.0* 02/11/2009   Lab Results  Component Value Date   DDIMER  Value: <0.22        AT THE INHOUSE ESTABLISHED CUTOFF VALUE OF 0.48 ug/mL FEU, THIS ASSAY HAS BEEN DOCUMENTED IN THE LITERATURE TO HAVE A SENSITIVITY AND NEGATIVE PREDICTIVE VALUE OF AT LEAST 98 TO 99%.  THE TEST RESULT SHOULD BE CORRELATED WITH AN  ASSESSMENT OF THE CLINICAL PROBABILITY OF DVT / VTE. REPEATED TO VERIFY 08/27/2010    Radiology/Studies:  Ct Angio Chest Pe W/cm &/or Wo Cm  10/01/2013   CLINICAL DATA:  Sharp chest pain and shortness of breath.  EXAM: CT ANGIOGRAPHY CHEST WITH CONTRAST  TECHNIQUE: Multidetector CT imaging of the chest was performed using the standard protocol during bolus administration of intravenous contrast. Multiplanar CT image reconstructions and MIPs were obtained to evaluate the vascular anatomy.  CONTRAST:  165mL OMNIPAQUE IOHEXOL 350 MG/ML SOLN  COMPARISON:  Chest x-ray dated 10/01/2013  FINDINGS: There are no pulmonary emboli, infiltrates, or effusions. Heart size is normal. There are multiple calcified lymph nodes in the hilar regions and in the mediastinum including a 2.3 cm calcified lymph node adjacent to the distal esophagus. There is small hiatal hernia.  The lungs are clear.  No osseous abnormalities.  Review of the MIP images confirms the above findings.  IMPRESSION: No pulmonary emboli or other acute abnormalities. Small hiatal hernia.   Electronically Signed   By: Rozetta Nunnery M.D.   On: 10/01/2013 17:21   Dg Chest Port 1 View  10/01/2013   CLINICAL DATA:  Chest pain, shortness of Breath  EXAM: PORTABLE CHEST - 1 VIEW  COMPARISON:  08/27/2010  FINDINGS: Cardiomediastinal silhouette is stable. Central mild vascular congestion without pulmonary edema. No segmental infiltrate. Mild left basilar atelectasis.  IMPRESSION: Central mild vascular congestion without pulmonary edema. Mild left basilar atelectasis.   Electronically Signed   By: Lahoma Crocker M.D.   On: 10/01/2013 16:05    ASSESSMENT AND PLAN:  1. NSTEMI with Hypertensive Emergency - Chest pain improved on NTG gtt, however CP is still present, 2/10. - Can continue enoxaparin overnight.   - She needs better blood pressure control.  Given concern for non-adherence, preference for simple regimen.  Agree with Norvasc and Coreg for now.  Would increase  coreg to 12.5mg  starting tonight.  Also add lisinopril 10mg  tonight as will not see benefit of norvasc for another day.   - Echo in morning to evaluate for wall motion abnormalities.  - Likely will need catheterization this admission, would wait until BP is under better control, possibly Tuesday.   Also will need to clarify ability to afford and reliably take aspirin and plavix.   Cleotis Lema 10/01/2013 7:17 PM

## 2013-10-01 NOTE — ED Provider Notes (Signed)
CSN: [id number]     Arrival date & time 10/01/13  1508 History   First MD Initiated Contact with Patient 10/01/13 1518     Chief Complaint  Patient presents with  . Chest Pain     (Consider location/radiation/quality/duration/timing/severity/associated sxs/prior Treatment) Patient is a 53 y.o. female presenting with chest pain. The history is provided by the patient.  Chest Pain Pain location:  Substernal area Pain quality: sharp and tightness   Pain radiates to:  L arm Pain radiates to the back: no   Pain severity:  Severe Onset quality:  Sudden Duration:  30 minutes Timing:  Constant Progression:  Unchanged Chronicity:  New Context: breathing and at rest   Relieved by:  Nothing Worsened by:  Nothing tried Ineffective treatments:  None tried Associated symptoms: shortness of breath   Associated symptoms: no abdominal pain, no back pain, no cough, no dizziness, no fatigue, no fever, no headache, no nausea and not vomiting     Past Medical History  Diagnosis Date  . Hypertension   . Hyperlipemia   . Seasonal allergies   . Sinusitis    Past Surgical History  Procedure Laterality Date  . Cesarean section    . Septoplasty    . Nasal turbinate reduction    . Partial hysterectomy     No family history on file. History  Substance Use Topics  . Smoking status: Never Smoker   . Smokeless tobacco: Not on file  . Alcohol Use: No   OB History   Grav Para Term Preterm Abortions TAB SAB Ect Mult Living                 Review of Systems  Constitutional: Negative for fever and fatigue.  HENT: Negative for congestion and drooling.   Eyes: Negative for pain.  Respiratory: Positive for chest tightness and shortness of breath. Negative for cough.   Cardiovascular: Positive for chest pain.  Gastrointestinal: Negative for nausea, vomiting, abdominal pain and diarrhea.  Genitourinary: Negative for dysuria and hematuria.  Musculoskeletal: Negative for back pain, gait problem  and neck pain.  Skin: Negative for color change.  Neurological: Negative for dizziness and headaches.  Hematological: Negative for adenopathy.  Psychiatric/Behavioral: Negative for behavioral problems.  All other systems reviewed and are negative.      Allergies  Penicillins  Home Medications   Current Outpatient Rx  Name  Route  Sig  Dispense  Refill  . fluticasone (FLONASE) 50 MCG/ACT nasal spray   Each Nare   Place 1 spray into both nostrils 2 (two) times daily.         [name] lisinopril-hydrochlorothiazide (PRINZIDE,ZESTORETIC) 20-25 MG per tablet   Oral   Take 1 tablet by mouth daily.          BP 230/122  Pulse 115  Temp(Src) 97.9 F (36.6 C) (Oral)  Resp 18  SpO2 100% Physical Exam  Nursing note and vitals reviewed. Constitutional: She is oriented to person, place, and time. She appears well-developed and well-nourished.  BP in Right arm 200/100 BP in Left arm 191/87  HENT:  Head: Normocephalic.  Mouth/Throat: Oropharynx is clear and moist. No oropharyngeal exudate.  Eyes: Conjunctivae and EOM are normal. Pupils are equal, round, and reactive to light.  Neck: Normal range of motion. Neck supple.  Cardiovascular: Normal rate, regular rhythm, normal heart sounds and intact distal pulses.  Exam reveals no gallop and no friction rub.   No murmur heard. Pulmonary/Chest: Effort normal and breath sounds normal. No  respiratory distress. She has no wheezes.  Abdominal: Soft. Bowel sounds are normal. There is no tenderness. There is no rebound and no guarding.  Musculoskeletal: Normal range of motion. She exhibits no edema and no tenderness.  Neurological: She is alert and oriented to person, place, and time.  Skin: Skin is warm and dry.  Psychiatric: She has a normal mood and affect. Her behavior is normal.    ED Course  Procedures (including critical care time) Labs Review Labs Reviewed  CBC - Abnormal; Notable for the following:    WBC 14.3 (*)    All other  components within normal limits  BASIC METABOLIC PANEL - Abnormal; Notable for the following:    Glucose, Bld 102 (*)    All other components within normal limits  URINALYSIS, ROUTINE W REFLEX MICROSCOPIC - Abnormal; Notable for the following:    Hgb urine dipstick TRACE (*)    Ketones, ur 15 (*)    All other components within normal limits  HEMOGLOBIN A1C - Abnormal; Notable for the following:    Hemoglobin A1C 5.9 (*)    Mean Plasma Glucose 123 (*)    All other components within normal limits  LIPID PANEL - Abnormal; Notable for the following:    Cholesterol 251 (*)    LDL Cholesterol 185 (*)    All other components within normal limits  TROPONIN I - Abnormal; Notable for the following:    Troponin I 3.66 (*)    All other components within normal limits  TROPONIN I - Abnormal; Notable for the following:    Troponin I 7.59 (*)    All other components within normal limits  TROPONIN I - Abnormal; Notable for the following:    Troponin I 9.39 (*)    All other components within normal limits  CBC - Abnormal; Notable for the following:    WBC 11.2 (*)    RBC 3.74 (*)    Hemoglobin 10.1 (*)    HCT 31.3 (*)    All other components within normal limits  BASIC METABOLIC PANEL - Abnormal; Notable for the following:    CO2 16 (*)    Glucose, Bld 113 (*)    All other components within normal limits  MRSA PCR SCREENING  PRO B NATRIURETIC PEPTIDE  URINE MICROSCOPIC-ADD ON  TSH  I-STAT TROPOININ, ED  POC URINE PREG, ED   Imaging Review Ct Angio Chest Pe W/cm &/or Wo Cm  10/01/2013   CLINICAL DATA:  Sharp chest pain and shortness of breath.  EXAM: CT ANGIOGRAPHY CHEST WITH CONTRAST  TECHNIQUE: Multidetector CT imaging of the chest was performed using the standard protocol during bolus administration of intravenous contrast. Multiplanar CT image reconstructions and MIPs were obtained to evaluate the vascular anatomy.  CONTRAST:  162mL OMNIPAQUE IOHEXOL 350 MG/ML SOLN  COMPARISON:  Chest  x-ray dated 10/01/2013  FINDINGS: There are no pulmonary emboli, infiltrates, or effusions. Heart size is normal. There are multiple calcified lymph nodes in the hilar regions and in the mediastinum including a 2.3 cm calcified lymph node adjacent to the distal esophagus. There is small hiatal hernia.  The lungs are clear.  No osseous abnormalities.  Review of the MIP images confirms the above findings.  IMPRESSION: No pulmonary emboli or other acute abnormalities. Small hiatal hernia.   Electronically Signed   By: Rozetta Nunnery M.D.   On: 10/01/2013 17:21   Dg Chest Port 1 View  10/01/2013   CLINICAL DATA:  Chest pain, shortness of Breath  EXAM: PORTABLE CHEST - 1 VIEW  COMPARISON:  08/27/2010  FINDINGS: Cardiomediastinal silhouette is stable. Central mild vascular congestion without pulmonary edema. No segmental infiltrate. Mild left basilar atelectasis.  IMPRESSION: Central mild vascular congestion without pulmonary edema. Mild left basilar atelectasis.   Electronically Signed   By: Lahoma Crocker M.D.   On: 10/01/2013 16:05     EKG Interpretation   Date/Time:  Sunday October 01 2013 15:21:10 EDT Ventricular Rate:  113 PR Interval:  134 QRS Duration: 97 QT Interval:  349 QTC Calculation: 478 R Axis:   89 Text Interpretation:  Sinus tachycardia ST elev, probable normal early  repol pattern Confirmed by Makell Drohan  MD, Keiyana Stehr ([id number]) on 10/01/2013  3:52:31 PM     CRITICAL CARE Performed by: Pamella Pert, S Total critical care time: 30 min Critical care time was exclusive of separately billable procedures and treating other patients. Critical care was necessary to treat or prevent imminent or life-threatening deterioration. Critical care was time spent personally by me on the following activities: development of treatment plan with patient and/or surrogate as well as nursing, discussions with consultants, evaluation of patient's response to treatment, examination of patient, obtaining history from  patient or surrogate, ordering and performing treatments and interventions, ordering and review of laboratory studies, ordering and review of radiographic studies, pulse oximetry and re-evaluation of patient's condition.  MDM   Final diagnoses:  Chest pain  NSTEMI (non-ST elevated myocardial infarction)  Hyperlipidemia  Anemia    3:48 PM 53 y.o. female w a hx of HTN, HLP who presents with sudden onset chest pain while sitting at church approximately 30 minutes ago. She now notes tightness in the center of her chest and her left arm. She denies any radiation to her back. She has equal blood pressures in her upper extremities. She presents mildly tachycardic and hypertensive at 230/120. Her pain is currently 10 out of 10. She notes some pain with deep inspiration. She has no significant risk factors for PE but given her tachycardia and pleuritic type pain will get CTA of chest to rule this out. Will also start nitroglycerin drip for blood pressure and chest pain. I suspect hypertensive emergency. Pt has not been taking her blood pressure meds for approximately one year. She was seen at an urgent care yesterday and noted to be hypertensive. She states that she received a prescription for lisinopril and took one yesterday but not today.  Will admit to step down unit.     Blanchard Kelch, MD 10/02/13 1125

## 2013-10-01 NOTE — Progress Notes (Signed)
ANTICOAGULATION CONSULT NOTE - Initial Consult  Pharmacy Consult for Enoxaparin Indication: chest pain/ACS  Allergies  Allergen Reactions  . Penicillins Rash    Pt states she has taken since intial  reaction without recurrence.     Patient Measurements: Height: 5\' 10"  (177.8 cm) Weight: 251 lb 12.3 oz (114.2 kg) IBW/kg (Calculated) : 68.5 Heparin Dosing Weight:   Vital Signs: Temp: 98.9 F (37.2 C) (04/05 2003) Temp src: Oral (04/05 2003) BP: 177/90 mmHg (04/05 2300) Pulse Rate: 88 (04/05 2300)  Labs:  Recent Labs  10/01/13 1522 10/01/13 2102  HGB 12.3  --   HCT 38.1  --   PLT 297  --   CREATININE 0.71  --   TROPONINI  --  3.66*    Estimated Creatinine Clearance: 114 ml/min (by C-G formula based on Cr of 0.71).   Medical History: Past Medical History  Diagnosis Date  . Hypertension   . Hyperlipemia   . Seasonal allergies   . Sinusitis     Medications:  Scheduled:  . amLODipine  5 mg Oral Daily  . aspirin  325 mg Oral Daily  . [START ON 10/02/2013] carvedilol  6.25 mg Oral BID WC  . enoxaparin (LOVENOX) injection  115 mg Subcutaneous BID  . fluticasone  1 spray Each Nare BID  . pantoprazole  40 mg Oral Daily  . simvastatin  40 mg Oral q1800  . sodium chloride  3 mL Intravenous Q12H    Assessment: Patient with CP.  MD wishes for enoxaparin for CP/ACS.  Goal of Therapy:  Anti-Xa level 0.6-1 units/ml 4hrs after LMWH dose given Monitor platelets by anticoagulation protocol: Yes   Plan:  Lovenox 115mg  sq q12hr  Nani Skillern Crowford 10/01/2013,11:09 PM

## 2013-10-01 NOTE — ED Notes (Signed)
Pt states that she began having sharp central chest pain w/ radiation to lt arm about 15 mins ago.  C/o dizziness and SOB associated.  Denies cardiac hx.

## 2013-10-01 NOTE — H&P (Signed)
[facility] History and Physical  Christine Barrett OAC:[id number] DOB: Jul 23, 1960 DOA: 10/01/2013  Referring physician:  PCP: Default, Provider, MD  Specialists:   Chief Complaint: chest pain   HPI: Christine Barrett is a 53 y.o. female with PMH of HTN, HPL, not taking any medications presented with substernal chest pain; she started having sudden onset non exertional chest pain while sitting at church around 3.30 pm today; she reports pain travelling to her L arm, no SOB, no cough, no fever; denies nausea, vomiting or diarrhea; denies exertional chest pain, but has DOE -In ED she found to have BP  233/120 started IV NTG which improved BP, and relieved the chest pain   Review of Systems: The patient denies anorexia, fever, weight loss,, vision loss, decreased hearing, hoarseness, chest pain, syncope, dyspnea on exertion, peripheral edema, balance deficits, hemoptysis, abdominal pain, melena, hematochezia, severe indigestion/heartburn, hematuria, incontinence, genital sores, muscle weakness, suspicious skin lesions, transient blindness, difficulty walking, depression, unusual weight change, abnormal bleeding, enlarged lymph nodes, angioedema, and breast masses.    Past Medical History  Diagnosis Date  . Hypertension   . Hyperlipemia   . Seasonal allergies   . Sinusitis    Past Surgical History  Procedure Laterality Date  . Cesarean section    . Septoplasty    . Nasal turbinate reduction    . Partial hysterectomy     Social History:  reports that she has never smoked. She does not have any smokeless tobacco history on file. She reports that she does not drink alcohol or use illicit drugs. Home;  where does patient live--home, ALF, SNF? and with whom if at home? Yes;  Can patient participate in ADLs?  Allergies  Allergen Reactions  . Penicillins Rash    Pt states she has taken since intial  reaction without recurrence.     No family history on file.  (be sure to complete)  Prior to  Admission medications   Medication Sig Start Date End Date Taking? Authorizing Provider  fluticasone (FLONASE) 50 MCG/ACT nasal spray Place 1 spray into both nostrils 2 (two) times daily.   Yes Historical Provider, MD  lisinopril-hydrochlorothiazide (PRINZIDE,ZESTORETIC) 20-25 MG per tablet Take 1 tablet by mouth daily.   Yes Historical Provider, MD   Physical Exam: Filed Vitals:   10/01/13 1740  BP: 180/96  Pulse: 91  Temp:   Resp: 23     General:  alert  Eyes: eom-i  ENT: no oral ulcers   Neck: supple  Cardiovascular: s1,s2 rrr  Respiratory: CTA BL  Abdomen: soft, nt,nd   Skin: no rash   Musculoskeletal: no LE edema  Psychiatric: no hallucinations   Neurologic: CN 2-12 intact,motor 5/5 BL  Labs on Admission:  Basic Metabolic Panel:  Recent Labs Lab 10/01/13 1522  NA 140  K 4.3  CL 102  CO2 22  GLUCOSE 102*  BUN 10  CREATININE 0.71  CALCIUM 10.0   Liver Function Tests: No results found for this basename: AST, ALT, ALKPHOS, BILITOT, PROT, ALBUMIN,  in the last 168 hours No results found for this basename: LIPASE, AMYLASE,  in the last 168 hours No results found for this basename: AMMONIA,  in the last 168 hours CBC:  Recent Labs Lab 10/01/13 1522  WBC 14.3*  HGB 12.3  HCT 38.1  MCV 82.6  PLT 297   Cardiac Enzymes: No results found for this basename: CKTOTAL, CKMB, CKMBINDEX, TROPONINI,  in the last 168 hours  BNP (last 3 results)  Recent Labs  10/01/13 1522  PROBNP 98.9   CBG: No results found for this basename: GLUCAP,  in the last 168 hours  Radiological Exams on Admission: Ct Angio Chest Pe W/cm &/or Wo Cm  10/01/2013   CLINICAL DATA:  Sharp chest pain and shortness of breath.  EXAM: CT ANGIOGRAPHY CHEST WITH CONTRAST  TECHNIQUE: Multidetector CT imaging of the chest was performed using the standard protocol during bolus administration of intravenous contrast. Multiplanar CT image reconstructions and MIPs were obtained to evaluate  the vascular anatomy.  CONTRAST:  156mL OMNIPAQUE IOHEXOL 350 MG/ML SOLN  COMPARISON:  Chest x-ray dated 10/01/2013  FINDINGS: There are no pulmonary emboli, infiltrates, or effusions. Heart size is normal. There are multiple calcified lymph nodes in the hilar regions and in the mediastinum including a 2.3 cm calcified lymph node adjacent to the distal esophagus. There is small hiatal hernia.  The lungs are clear.  No osseous abnormalities.  Review of the MIP images confirms the above findings.  IMPRESSION: No pulmonary emboli or other acute abnormalities. Small hiatal hernia.   Electronically Signed   By: Rozetta Nunnery M.D.   On: 10/01/2013 17:21   Dg Chest Port 1 View  10/01/2013   CLINICAL DATA:  Chest pain, shortness of Breath  EXAM: PORTABLE CHEST - 1 VIEW  COMPARISON:  08/27/2010  FINDINGS: Cardiomediastinal silhouette is stable. Central mild vascular congestion without pulmonary edema. No segmental infiltrate. Mild left basilar atelectasis.  IMPRESSION: Central mild vascular congestion without pulmonary edema. Mild left basilar atelectasis.   Electronically Signed   By: Lahoma Crocker M.D.   On: 10/01/2013 16:05    EKG: Independently reviewed. Sinus tachycardia   Assessment/Plan Principal Problem:   Hypertensive urgency Active Problems:   HYPERLIPIDEMIA   HYPERTENSION   Chest pain  53 y.o. female with PMH of HTN, HPL, not taking any medications presented with substernal chest pain; she started having sudden onset non exertional chest pain  1. Chest pain suspicious for Unstable angina; pain improved on NTG; initial trop negative; CTA chest unremarkable  -cont IV NTG, start ASA, BB, statin; anticoagulation; f/u serial trop, ECG; consulted cardiology; obtain echo; ? LHC  2. HTN urgency; non adherence to medications  -cont IV NTG/taper; started BB, amlodipine   3. HPL start statin, check lipids;HA1c; tsh   4. Leukocytosis of unclear etiology; ? Stress related; no clear s/s of infection; CT  chest Unremarkable; pend UA    Cardiology;  if consultant consulted, please document name and whether formally or informally consulted  Code Status: full (must indicate code status--if unknown or must be presumed, indicate so) Family Communication: d/w patient, her daughter, sister (indicate person spoken with, if applicable, with phone number if by telephone) Disposition Plan: home pend clinical improvement  (indicate anticipated LOS)  Time spent: >35 minutes   Kinnie Feil [facility] Pager [phone]  If 7PM-7AM, please contact night-coverage [email or web address] Password [facility] 10/01/2013, 6:13 PM

## 2013-10-02 ENCOUNTER — Encounter (HOSPITAL_COMMUNITY): Payer: Self-pay | Admitting: Interventional Cardiology

## 2013-10-02 ENCOUNTER — Other Ambulatory Visit: Payer: Self-pay

## 2013-10-02 ENCOUNTER — Encounter (HOSPITAL_COMMUNITY)
Admission: EM | Disposition: A | Discharge status: Home / Self Care | Payer: Self-pay | Source: Home / Self Care | Attending: Internal Medicine

## 2013-10-02 DIAGNOSIS — I252 Old myocardial infarction: Secondary | ICD-10-CM | POA: Diagnosis present

## 2013-10-02 DIAGNOSIS — I517 Cardiomegaly: Secondary | ICD-10-CM

## 2013-10-02 DIAGNOSIS — I161 Hypertensive emergency: Secondary | ICD-10-CM | POA: Diagnosis present

## 2013-10-02 DIAGNOSIS — I214 Non-ST elevation (NSTEMI) myocardial infarction: Secondary | ICD-10-CM

## 2013-10-02 DIAGNOSIS — D649 Anemia, unspecified: Secondary | ICD-10-CM | POA: Diagnosis present

## 2013-10-02 DIAGNOSIS — I2510 Atherosclerotic heart disease of native coronary artery without angina pectoris: Secondary | ICD-10-CM

## 2013-10-02 DIAGNOSIS — E785 Hyperlipidemia, unspecified: Secondary | ICD-10-CM

## 2013-10-02 HISTORY — PX: TRANSTHORACIC ECHOCARDIOGRAM: SHX275

## 2013-10-02 HISTORY — PX: LEFT HEART CATHETERIZATION WITH CORONARY ANGIOGRAM: SHX5451

## 2013-10-02 LAB — BASIC METABOLIC PANEL
BUN: 10 mg/dL (ref 6–23)
CALCIUM: 8.6 mg/dL (ref 8.4–10.5)
CO2: 16 mEq/L — ABNORMAL LOW (ref 19–32)
CREATININE: 0.56 mg/dL (ref 0.50–1.10)
Chloride: 105 mEq/L (ref 96–112)
GFR calc Af Amer: >90 mL/min (ref >90)
Glucose, Bld: 113 mg/dL — ABNORMAL HIGH (ref 70–99)
Potassium: 4.2 mEq/L (ref 3.7–5.3)
Sodium: 139 mEq/L (ref 137–147)

## 2013-10-02 LAB — TSH: TSH: 2.62 u[IU]/mL (ref 0.350–4.500)

## 2013-10-02 LAB — TROPONIN I
TROPONIN I: 9.39 ng/mL — AB (ref <0.30)
Troponin I: 7.59 ng/mL (ref <0.30)

## 2013-10-02 LAB — CBC
HEMATOCRIT: 31.3 % — AB (ref 36.0–46.0)
Hemoglobin: 10.1 g/dL — ABNORMAL LOW (ref 12.0–15.0)
MCH: 27 pg (ref 26.0–34.0)
MCHC: 32.3 g/dL (ref 30.0–36.0)
MCV: 83.7 fL (ref 78.0–100.0)
Platelets: ADEQUATE 10*3/uL (ref 150–400)
RBC: 3.74 MIL/uL — ABNORMAL LOW (ref 3.87–5.11)
RDW: 14.1 % (ref 11.5–15.5)
WBC: 11.2 10*3/uL — ABNORMAL HIGH (ref 4.0–10.5)

## 2013-10-02 LAB — LIPID PANEL
Cholesterol: 251 mg/dL — ABNORMAL HIGH (ref 0–200)
HDL: 44 mg/dL (ref >39)
LDL CALC: 185 mg/dL — AB (ref 0–99)
Total CHOL/HDL Ratio: 5.7 RATIO
Triglycerides: 110 mg/dL (ref <150)
VLDL: 22 mg/dL (ref 0–40)

## 2013-10-02 LAB — HEMOGLOBIN A1C
HEMOGLOBIN A1C: 5.9 % — AB (ref <5.7)
Mean Plasma Glucose: 123 mg/dL — ABNORMAL HIGH (ref <117)

## 2013-10-02 SURGERY — LEFT HEART CATHETERIZATION WITH CORONARY ANGIOGRAM
Anesthesia: LOCAL

## 2013-10-02 MED ORDER — LIDOCAINE HCL (PF) 1 % IJ SOLN
INTRAMUSCULAR | Status: AC
  Filled 2013-10-02: qty 30

## 2013-10-02 MED ORDER — ACETAMINOPHEN 325 MG PO TABS: 650.0000 mg | ORAL_TABLET | ORAL | Status: DC | PRN

## 2013-10-02 MED ORDER — SODIUM CHLORIDE 0.9 % IV SOLN: 250.0000 mL | INTRAVENOUS | Status: DC | PRN

## 2013-10-02 MED ORDER — NITROGLYCERIN IN D5W 200-5 MCG/ML-% IV SOLN
2.0000 ug/min | INTRAVENOUS | Status: DC
Start: 2013-10-02 — End: 2013-10-02

## 2013-10-02 MED ORDER — HYDROCODONE-ACETAMINOPHEN 5-325 MG PO TABS
1.0000 | ORAL_TABLET | ORAL | Status: DC | PRN
  Administered 2013-10-02 – 2013-10-03 (×6): 1 via ORAL
  Filled 2013-10-02 (×5): qty 1

## 2013-10-02 MED ORDER — NITROGLYCERIN 0.2 MG/ML ON CALL CATH LAB
INTRAVENOUS | Status: AC
  Filled 2013-10-02: qty 1

## 2013-10-02 MED ORDER — ASPIRIN 81 MG PO CHEW: 81.0000 mg | CHEWABLE_TABLET | ORAL | Status: DC

## 2013-10-02 MED ORDER — METOPROLOL TARTRATE 1 MG/ML IV SOLN
INTRAVENOUS | Status: AC
  Filled 2013-10-02: qty 5

## 2013-10-02 MED ORDER — ONDANSETRON HCL 4 MG/2ML IJ SOLN
4.0000 mg | Freq: Four times a day (QID) | INTRAMUSCULAR | Status: DC | PRN
  Filled 2013-10-02: qty 2

## 2013-10-02 MED ORDER — TICAGRELOR 90 MG PO TABS
180.0000 mg | ORAL_TABLET | Freq: Once | ORAL | Status: AC
  Administered 2013-10-02: 180 mg via ORAL
  Filled 2013-10-02: qty 2

## 2013-10-02 MED ORDER — VERAPAMIL HCL 2.5 MG/ML IV SOLN
INTRAVENOUS | Status: AC
  Filled 2013-10-02: qty 2

## 2013-10-02 MED ORDER — HEPARIN (PORCINE) IN NACL 2-0.9 UNIT/ML-% IJ SOLN
INTRAMUSCULAR | Status: AC
  Filled 2013-10-02: qty 1000

## 2013-10-02 MED ORDER — LABETALOL HCL 5 MG/ML IV SOLN
INTRAVENOUS | Status: AC
  Filled 2013-10-02: qty 4

## 2013-10-02 MED ORDER — ASPIRIN EC 81 MG PO TBEC
81.0000 mg | DELAYED_RELEASE_TABLET | Freq: Every day | ORAL | Status: DC
  Administered 2013-10-04: 08:00:00 81 mg via ORAL
  Filled 2013-10-02 (×3): qty 1

## 2013-10-02 MED ORDER — HEPARIN SODIUM (PORCINE) 1000 UNIT/ML IJ SOLN
INTRAMUSCULAR | Status: AC
  Filled 2013-10-02: qty 1

## 2013-10-02 MED ORDER — TICAGRELOR 90 MG PO TABS
90.0000 mg | ORAL_TABLET | Freq: Two times a day (BID) | ORAL | Status: DC
Start: 2013-10-02 — End: 2013-10-02
  Filled 2013-10-02: qty 1

## 2013-10-02 MED ORDER — FENTANYL CITRATE 0.05 MG/ML IJ SOLN
INTRAMUSCULAR | Status: AC
  Filled 2013-10-02: qty 2

## 2013-10-02 MED ORDER — SODIUM CHLORIDE 0.9 % IJ SOLN: 3.0000 mL | Freq: Two times a day (BID) | INTRAMUSCULAR | Status: DC

## 2013-10-02 MED ORDER — MIDAZOLAM HCL 2 MG/2ML IJ SOLN
INTRAMUSCULAR | Status: AC
  Filled 2013-10-02: qty 2

## 2013-10-02 MED ORDER — TICAGRELOR 90 MG PO TABS
90.0000 mg | ORAL_TABLET | Freq: Two times a day (BID) | ORAL | Status: DC
  Administered 2013-10-03 – 2013-10-04 (×2): 90 mg via ORAL
  Filled 2013-10-02 (×4): qty 1

## 2013-10-02 MED ORDER — HEPARIN (PORCINE) IN NACL 100-0.45 UNIT/ML-% IJ SOLN
1300.0000 [IU]/h | INTRAMUSCULAR | Status: DC
  Administered 2013-10-03: 1300 [IU]/h via INTRAVENOUS
  Filled 2013-10-02: qty 250

## 2013-10-02 MED ORDER — SODIUM CHLORIDE 0.9 % IV SOLN: INTRAVENOUS | Status: DC

## 2013-10-02 MED ORDER — SODIUM CHLORIDE 0.9 % IJ SOLN: 3.0000 mL | INTRAMUSCULAR | Status: DC | PRN

## 2013-10-02 MED ORDER — ATORVASTATIN CALCIUM 40 MG PO TABS
40.0000 mg | ORAL_TABLET | Freq: Every day | ORAL | Status: DC
  Administered 2013-10-03: 17:00:00 40 mg via ORAL
  Filled 2013-10-02 (×3): qty 1

## 2013-10-02 MED ORDER — SODIUM CHLORIDE 0.9 % IV SOLN
INTRAVENOUS | Status: DC
  Administered 2013-10-02: 11:00:00 via INTRAVENOUS

## 2013-10-02 NOTE — Progress Notes (Signed)
UR completed. Patient changed to inpatient- NSTEMI and requiring IV NTG gtt.

## 2013-10-02 NOTE — Progress Notes (Addendum)
SUBJECTIVE:  CP resolved.  BP improved  OBJECTIVE:   Vitals:   Filed Vitals:   10/02/13 0600 10/02/13 0700 10/02/13 0800 10/02/13 1006  BP: 155/71 154/77 158/79 157/75  Pulse: 73 77 81 90  Temp:   97.5 F (36.4 C)   TempSrc:   Axillary   Resp: 14 17 17 25   Height:      Weight:      SpO2: 100% 100% 100% 100%   I&O's:   Intake/Output Summary (Last 24 hours) at 10/02/13 1043 Last data filed at 10/02/13 1000  Gross per 24 hour  Intake 978.01 ml  Output    950 ml  Net  28.01 ml   TELEMETRY: Reviewed telemetry pt in NSR:     PHYSICAL EXAM General: Well developed, well nourished, in no acute distress Head:   Normal cephalic and atramatic  Lungs:  Clear bilaterally to auscultation. Heart:   HRRR S1 S2  No JVD.   Abdomen: abdomen soft and non-tender Msk:  Back normal,  Normal strength and tone for age. Extremities:   No edema.  2+ right radial pulse Neuro: Alert and oriented. Psych:  Normal affect, responds appropriately   LABS: Basic Metabolic Panel:  Recent Labs  10/01/13 1522 10/02/13 0334  NA 140 139  K 4.3 4.2  CL 102 105  CO2 22 16*  GLUCOSE 102* 113*  BUN 10 10  CREATININE 0.71 0.56  CALCIUM 10.0 8.6   Liver Function Tests: No results found for this basename: AST, ALT, ALKPHOS, BILITOT, PROT, ALBUMIN,  in the last 72 hours No results found for this basename: LIPASE, AMYLASE,  in the last 72 hours CBC:  Recent Labs  10/01/13 1522 10/02/13 0334  WBC 14.3* 11.2*  HGB 12.3 10.1*  HCT 38.1 31.3*  MCV 82.6 83.7  PLT 297 PLATELET CLUMPS NOTED ON SMEAR, COUNT APPEARS ADEQUATE   Cardiac Enzymes:  Recent Labs  10/01/13 2102 10/02/13 0334 10/02/13 0821  TROPONINI 3.66* 7.59* 9.39*   BNP: No components found with this basename: POCBNP,  D-Dimer: No results found for this basename: DDIMER,  in the last 72 hours Hemoglobin A1C:  Recent Labs  10/01/13 2102  HGBA1C 5.9*   Fasting Lipid Panel:  Recent Labs  10/01/13 2104  CHOL 251*    HDL 44  LDLCALC 185*  TRIG 110  CHOLHDL 5.7   Thyroid Function Tests:  Recent Labs  10/01/13 2104  TSH 2.620   Anemia Panel: No results found for this basename: VITAMINB12, FOLATE, FERRITIN, TIBC, IRON, RETICCTPCT,  in the last 72 hours Coag Panel:   No results found for this basename: INR, PROTIME    RADIOLOGY: Ct Angio Chest Pe W/cm &/or Wo Cm  10/01/2013   CLINICAL DATA:  Sharp chest pain and shortness of breath.  EXAM: CT ANGIOGRAPHY CHEST WITH CONTRAST  TECHNIQUE: Multidetector CT imaging of the chest was performed using the standard protocol during bolus administration of intravenous contrast. Multiplanar CT image reconstructions and MIPs were obtained to evaluate the vascular anatomy.  CONTRAST:  152mL OMNIPAQUE IOHEXOL 350 MG/ML SOLN  COMPARISON:  Chest x-ray dated 10/01/2013  FINDINGS: There are no pulmonary emboli, infiltrates, or effusions. Heart size is normal. There are multiple calcified lymph nodes in the hilar regions and in the mediastinum including a 2.3 cm calcified lymph node adjacent to the distal esophagus. There is small hiatal hernia.  The lungs are clear.  No osseous abnormalities.  Review of the MIP images confirms the above findings.  IMPRESSION:  No pulmonary emboli or other acute abnormalities. Small hiatal hernia.   Electronically Signed   By: Rozetta Nunnery M.D.   On: 10/01/2013 17:21   Dg Chest Port 1 View  10/01/2013   CLINICAL DATA:  Chest pain, shortness of Breath  EXAM: PORTABLE CHEST - 1 VIEW  COMPARISON:  08/27/2010  FINDINGS: Cardiomediastinal silhouette is stable. Central mild vascular congestion without pulmonary edema. No segmental infiltrate. Mild left basilar atelectasis.  IMPRESSION: Central mild vascular congestion without pulmonary edema. Mild left basilar atelectasis.   Electronically Signed   By: Lahoma Crocker M.D.   On: 10/01/2013 16:05      ASSESSMENT/PLAN:   NSTEMI: Receiving Lovenox for anticoagulation.  Plan for cardiac cath.  Risks and  benefits explained to patient and she is willing to proceed.  All questions answered.  Discussed importance of adhering to DAPT if stent is placed.  She has not been compliant with meds in the past.  BMS may be reasonable until we establish better compliance.  No clear ECG changes.  Of note, patients sister recently diagnosed with brain mets from breast cancer.    HTN: Significantly better. Plan for cath later today given that troponin continues to rise.  COntinue beta blocker, ACE-I and amlodipine.  Wean NTG due to headache.  Increase ACE-I if BP starts to rise.   Otitis media: She was told at [facility] that she had an ear infection.  She was on amoxicilin but has not had any since being in the hospital. Restart Amoxicillin 500 mg BID.  However, she has a PCN allergy. WIll hold off on any ABx at this time.  Defer to IM.  Hyperlipidemia: LDL 185. Now with target of 70.  Switch simvastatin to atorvastatin.  Anemia:  Hbg dropped this morning.  Unclear if this is due to hydration.  Will recheck. No obvious signs of bleeding. Received 1500 cc in ER yesterday.  She will be transferred to [facility] and likely watched there post cath.  Jettie Booze., MD  10/02/2013  10:43 AM

## 2013-10-02 NOTE — Progress Notes (Signed)
Echocardiogram 2D Echocardiogram has been performed.  Christine Barrett 10/02/2013, 11:38 AM

## 2013-10-02 NOTE — H&P (View-Only) (Signed)
SUBJECTIVE:  CP resolved.  BP improved  OBJECTIVE:   Vitals:   Filed Vitals:   10/02/13 0600 10/02/13 0700 10/02/13 0800 10/02/13 1006  BP: 155/71 154/77 158/79 157/75  Pulse: 73 77 81 90  Temp:   97.5 F (36.4 C)   TempSrc:   Axillary   Resp: 14 17 17 25   Height:      Weight:      SpO2: 100% 100% 100% 100%   I&O's:   Intake/Output Summary (Last 24 hours) at 10/02/13 1043 Last data filed at 10/02/13 1000  Gross per 24 hour  Intake 978.01 ml  Output    950 ml  Net  28.01 ml   TELEMETRY: Reviewed telemetry pt in NSR:     PHYSICAL EXAM General: Well developed, well nourished, in no acute distress Head:   Normal cephalic and atramatic  Lungs:  Clear bilaterally to auscultation. Heart:   HRRR S1 S2  No JVD.   Abdomen: abdomen soft and non-tender Msk:  Back normal,  Normal strength and tone for age. Extremities:   No edema.  2+ right radial pulse Neuro: Alert and oriented. Psych:  Normal affect, responds appropriately   LABS: Basic Metabolic Panel:  Recent Labs  10/01/13 1522 10/02/13 0334  NA 140 139  K 4.3 4.2  CL 102 105  CO2 22 16*  GLUCOSE 102* 113*  BUN 10 10  CREATININE 0.71 0.56  CALCIUM 10.0 8.6   Liver Function Tests: No results found for this basename: AST, ALT, ALKPHOS, BILITOT, PROT, ALBUMIN,  in the last 72 hours No results found for this basename: LIPASE, AMYLASE,  in the last 72 hours CBC:  Recent Labs  10/01/13 1522 10/02/13 0334  WBC 14.3* 11.2*  HGB 12.3 10.1*  HCT 38.1 31.3*  MCV 82.6 83.7  PLT 297 PLATELET CLUMPS NOTED ON SMEAR, COUNT APPEARS ADEQUATE   Cardiac Enzymes:  Recent Labs  10/01/13 2102 10/02/13 0334 10/02/13 0821  TROPONINI 3.66* 7.59* 9.39*   BNP: No components found with this basename: POCBNP,  D-Dimer: No results found for this basename: DDIMER,  in the last 72 hours Hemoglobin A1C:  Recent Labs  10/01/13 2102  HGBA1C 5.9*   Fasting Lipid Panel:  Recent Labs  10/01/13 2104  CHOL 251*    HDL 44  LDLCALC 185*  TRIG 110  CHOLHDL 5.7   Thyroid Function Tests:  Recent Labs  10/01/13 2104  TSH 2.620   Anemia Panel: No results found for this basename: VITAMINB12, FOLATE, FERRITIN, TIBC, IRON, RETICCTPCT,  in the last 72 hours Coag Panel:   No results found for this basename: INR, PROTIME    RADIOLOGY: Ct Angio Chest Pe W/cm &/or Wo Cm  10/01/2013   CLINICAL DATA:  Sharp chest pain and shortness of breath.  EXAM: CT ANGIOGRAPHY CHEST WITH CONTRAST  TECHNIQUE: Multidetector CT imaging of the chest was performed using the standard protocol during bolus administration of intravenous contrast. Multiplanar CT image reconstructions and MIPs were obtained to evaluate the vascular anatomy.  CONTRAST:  175mL OMNIPAQUE IOHEXOL 350 MG/ML SOLN  COMPARISON:  Chest x-ray dated 10/01/2013  FINDINGS: There are no pulmonary emboli, infiltrates, or effusions. Heart size is normal. There are multiple calcified lymph nodes in the hilar regions and in the mediastinum including a 2.3 cm calcified lymph node adjacent to the distal esophagus. There is small hiatal hernia.  The lungs are clear.  No osseous abnormalities.  Review of the MIP images confirms the above findings.  IMPRESSION:  No pulmonary emboli or other acute abnormalities. Small hiatal hernia.   Electronically Signed   By: Rozetta Nunnery M.D.   On: 10/01/2013 17:21   Dg Chest Port 1 View  10/01/2013   CLINICAL DATA:  Chest pain, shortness of Breath  EXAM: PORTABLE CHEST - 1 VIEW  COMPARISON:  08/27/2010  FINDINGS: Cardiomediastinal silhouette is stable. Central mild vascular congestion without pulmonary edema. No segmental infiltrate. Mild left basilar atelectasis.  IMPRESSION: Central mild vascular congestion without pulmonary edema. Mild left basilar atelectasis.   Electronically Signed   By: Lahoma Crocker M.D.   On: 10/01/2013 16:05      ASSESSMENT/PLAN:   NSTEMI: Receiving Lovenox for anticoagulation.  Plan for cardiac cath.  Risks and  benefits explained to patient and she is willing to proceed.  All questions answered.  Discussed importance of adhering to DAPT if stent is placed.  She has not been compliant with meds in the past.  BMS may be reasonable until we establish better compliance.  No clear ECG changes.  Of note, patients sister recently diagnosed with brain mets from breast cancer.    HTN: Significantly better. Plan for cath later today given that troponin continues to rise.  COntinue beta blocker, ACE-I and amlodipine.  Wean NTG due to headache.  Increase ACE-I if BP starts to rise.   Otitis media: She was told at [facility] that she had an ear infection.  She was on amoxicilin but has not had any since being in the hospital. Restart Amoxicillin 500 mg BID.  However, she has a PCN allergy. WIll hold off on any ABx at this time.  Defer to IM.  Hyperlipidemia: LDL 185. Now with target of 70.  Switch simvastatin to atorvastatin.  Anemia:  Hbg dropped this morning.  Unclear if this is due to hydration.  Will recheck. No obvious signs of bleeding. Received 1500 cc in ER yesterday.  She will be transferred to [facility] and likely watched there post cath.  Jettie Booze., MD  10/02/2013  10:43 AM

## 2013-10-02 NOTE — CV Procedure (Addendum)
Christine Barrett is a 53 y.o. female    [id number]  [id number] LOCATION:  FACILITY: [facility]  PHYSICIAN: [name], MD, [facility] 03-20-1961   DATE OF PROCEDURE:  10/02/2013    CARDIAC CATHETERIZATION     HISTORY:   Christine Barrett is a 53 year old female who was admitted to [facility] yesterday with chest pain. She subsequently has ruled in for a non-ST segment elevation myocardial infarction with troponin increasing to 9.39. She received a dose of Lovenox this morning at approximately 10:30 AM. She was transferred  At 5:30 PM from [facility] to the [facility] catheterization cardiac catheterization and possible intervention after allowing time for Lovenox to be worn off from today's earlier dosing.   PROCEDURE:  The patient was brought to the second floor [facility]iac cath lab and was pain-free but hypertensive. A right radial approach was utilized after an Allen's test was positive. The right radial artery was punctured the Seldinger technique and a 5/6 Glidesheath slender sheath was inserted without difficulty. A radial cocktail was administered consisting of nitroglycerin, verapamil, and lidocaine. 5000 units of heparin were administered after a JR 4 diagnostic catheter and a safety J wire were advanced to the central aorta.  Due to the upward angle takeoff of the right coronary artery, this catheter was then exchanged for a Tig catheter. The Tig catheter was able to selectively engage the left coronary system but was selectively injecting into the left circumflex territory and had suboptimal visualization of the LAD. Multiple views were obtained of the circumflex. The Tig catheter was then used for selective angiography into the right coronary artery successfully. A JL3.5 and ultimately 3.0 catheter was necessary to adequately visualize the LAD system. A 5 French pigtail catheter was used for biplane ventriculography to assess the left circumflex territory. At this time, due to  the hour of a proximally 7 PM with the potential complex intervention to the circumflex due to a significant sharp angle takeoff the decision was made not to attempt intervention tonight but to schedule the patient for elective intervention tomorrow. During the procedure, because her blood pressure was significantly elevated she was titrated up to 60 mcg of intravenous nitroglycerin. She also received Lopressor 2.5 mg x2 and prior to leaving the lab received 20 mg of labetalol. She left the catheterization laboratory chest pain free.  HEMODYNAMICS:   Central Aorta: 180/106   Left Ventricle: 180/17  ANGIOGRAPHY:  1. Left main: Very short and immediately gave rise to the LAD and left circumflex vessel.  2. LAD: 60-70% proximal stenosis after the first diagonal vessel in the region of a septal perforating artery. The LAD extended to and wrapped around the LV apex. 3. Left circumflex: Moderate size vessel that had a radiation immediate like branch that arose from the left main. The AV groove circumflex arose from a greater than 90 angle from this proximal intermediate like vessel.  4. Right coronary artery: Moderate size vessel with 30% proximal narrowing  Mild LV dysfunction with an ejection fraction of approximately 50%. On the RAO projection there was evidence for focal mid-distal inferior hypocontractility. On the LAO projection there was evidence for old posterolateral hypocontractility   IMPRESSION:  Mild LV dysfunction with an ejection fraction of 50% with evidence for low posterolateral and mid-distal inferior hypocontractility.  Multivessel coronary artery disease with 60-70% ostial to mid LAD stenosis; 95% ostial stenosis of an AV groove circumflex which arose from a ramus intermediate like vessel; and 30% proximal right  coronary artery stenosis.  RECOMMENDATION:   Christine Barrett has a 95% ostial stenosis in the AV groove branch which arises from the proximal intermediate like vessel.  This does not appear to be stentable and it's very sharp angle may make it difficult for cutting balloon intervention. The patient will be continued on IV nitroglycerin, beta blocker therapy, heparin will be restarted and she will be hydrated with plans for PCI to be done tomorrow by my colleagues after their angiographic review.  [name], MD, [facility] 10/02/2013 7:23 PM

## 2013-10-02 NOTE — Progress Notes (Addendum)
ANTICOAGULATION CONSULT NOTE - Follow Up Consult  Pharmacy Consult for heparin Indication: chest pain/ACS  Allergies  Allergen Reactions  . Penicillins Rash    Pt states she has taken since intial  reaction without recurrence.     Patient Measurements: Height: 5\' 10"  (177.8 cm) Weight: 251 lb 5.2 oz (114 kg) IBW/kg (Calculated) : 68.5 Heparin Dosing Weight: 94kg  Vital Signs: Temp: 98.2 F (36.8 C) (04/06 2008) Temp src: Oral (04/06 2008) BP: 173/92 mmHg (04/06 2008) Pulse Rate: 87 (04/06 2008)  Labs:  Recent Labs  10/01/13 1522 10/01/13 2102 10/02/13 0334 10/02/13 0821  HGB 12.3  --  10.1*  --   HCT 38.1  --  31.3*  --   PLT 297  --  PLATELET CLUMPS NOTED ON SMEAR, COUNT APPEARS ADEQUATE  --   CREATININE 0.71  --  0.56  --   TROPONINI  --  3.66* 7.59* 9.39*    Estimated Creatinine Clearance: 113.9 ml/min (by C-G formula based on Cr of 0.56).   Medications:  Scheduled:  . amLODipine  5 mg Oral Daily  . aspirin EC  81 mg Oral Daily  . atorvastatin  40 mg Oral q1800  . carvedilol  12.5 mg Oral BID  . enoxaparin (LOVENOX) injection  115 mg Subcutaneous BID  . fluticasone  1 spray Each Nare BID  . lisinopril  10 mg Oral QHS  . pantoprazole  40 mg Oral Daily  . sodium chloride  3 mL Intravenous Q12H  . Ticagrelor  90 mg Oral BID   Infusions:  . sodium chloride 50 mL/hr at 10/01/13 2100  . sodium chloride 150 mL/hr at 10/02/13 1928  . nitroGLYCERIN Stopped (10/02/13 1421)    Assessment: 53 yo female s/p cath to restart heparin 6 hrs post TR band removal.  Patient was previously on lovenox and last dose was about 10:30 this am. Patient noted for PCI on 10/03/13.  Plans for TR band removal at about 9:30pm  Goal of Therapy:  Heparin level 0.3-0.7 units/ml Monitor platelets by anticoagulation protocol: Yes   Plan:   -Begin heparin at 1300 units/hr (~14 units/kg/hr) at 4am on 10/03/13 -Heparin level in 6 hours  Hildred Laser, Pharm D 10/02/2013 8:36 PM

## 2013-10-02 NOTE — Progress Notes (Signed)
[facility] PROGRESS NOTE  Christine Barrett GMW:[id number] DOB: 1961-05-08 DOA: 10/01/2013 PCP: Default, Provider, MD  Assessment/Plan: Principal Problem:  NSTEMI Hypertensive urgency  Active Problems:  HYPERLIPIDEMIA  HYPERTENSION  Chest pain   53 y.o. female with PMH of HTN, HPL, not taking any medications presented with substernal chest pain; she started having sudden onset non exertional chest pain, NSTEMI, HTN emergency  1. NSTEMI/ACS; CTA chest unremarkable; chest pain resolved   -cont IV NTG, started ASA, BB, statin; anticoagulation; pend echo; LHC per cardiology   2. HTN emergency with ACS; non adherence to medications  -BP improved on IV NTG/taper; started BB, amlodipine; titrate per response   3. HPL started statin  4. Leukocytosis of unclear etiology; ? Stress related; no clear s/s of infection; CT chest, UA Unremarkable;  Cardiology; if consultant consulted, please document name and whether formally or informally consulted   Code Status: full (must indicate code status--if unknown or must be presumed, indicate so)  Family Communication: d/w patient, her daughter, sister (indicate person spoken with, if applicable, with phone number if by telephone)  Disposition Plan: home pend clinical improvement (indicate anticipated LOS)   Consultants:  Cardiology   Procedures:  Pend LHC  Antibiotics:  nonen (indicate start date, and stop date if known)  HPI/Subjective: alert  Objective: Filed Vitals:   10/02/13 0700  BP: 154/77  Pulse: 77  Temp:   Resp: 17    Intake/Output Summary (Last 24 hours) at 10/02/13 0737 Last data filed at 10/02/13 0700  Gross per 24 hour  Intake 783.01 ml  Output    750 ml  Net  33.01 ml   Filed Weights   10/01/13 1900 10/01/13 2003 10/02/13 0400  Weight: 114.2 kg (251 lb 12.3 oz) 114.2 kg (251 lb 12.3 oz) 114.2 kg (251 lb 12.3 oz)    Exam:   General:  alert  Cardiovascular: s1,s2 rrr  Respiratory: CTA  BL  Abdomen: soft,nt,nd   Musculoskeletal: no L E edema   Data Reviewed: Basic Metabolic Panel:  Recent Labs Lab 10/01/13 1522 10/02/13 0334  NA 140 139  K 4.3 4.2  CL 102 105  CO2 22 16*  GLUCOSE 102* 113*  BUN 10 10  CREATININE 0.71 0.56  CALCIUM 10.0 8.6   Liver Function Tests: No results found for this basename: AST, ALT, ALKPHOS, BILITOT, PROT, ALBUMIN,  in the last 168 hours No results found for this basename: LIPASE, AMYLASE,  in the last 168 hours No results found for this basename: AMMONIA,  in the last 168 hours CBC:  Recent Labs Lab 10/01/13 1522 10/02/13 0334  WBC 14.3* 11.2*  HGB 12.3 10.1*  HCT 38.1 31.3*  MCV 82.6 83.7  PLT 297 PLATELET CLUMPS NOTED ON SMEAR, COUNT APPEARS ADEQUATE   Cardiac Enzymes:  Recent Labs Lab 10/01/13 2102 10/02/13 0334  TROPONINI 3.66* 7.59*   BNP (last 3 results)  Recent Labs  10/01/13 1522  PROBNP 98.9   CBG: No results found for this basename: GLUCAP,  in the last 168 hours  Recent Results (from the past 240 hour(s))  MRSA PCR SCREENING     Status: None   Collection Time    10/01/13  7:15 PM      Result Value Ref Range Status   MRSA by PCR NEGATIVE  NEGATIVE Final   Comment:            The GeneXpert MRSA Assay (FDA     approved for NASAL specimens     only),  is one component of a     comprehensive MRSA colonization     surveillance program. It is not     intended to diagnose MRSA     infection nor to guide or     monitor treatment for     MRSA infections.     Studies: Ct Angio Chest Pe W/cm &/or Wo Cm  10/01/2013   CLINICAL DATA:  Sharp chest pain and shortness of breath.  EXAM: CT ANGIOGRAPHY CHEST WITH CONTRAST  TECHNIQUE: Multidetector CT imaging of the chest was performed using the standard protocol during bolus administration of intravenous contrast. Multiplanar CT image reconstructions and MIPs were obtained to evaluate the vascular anatomy.  CONTRAST:  172mL OMNIPAQUE IOHEXOL 350 MG/ML SOLN   COMPARISON:  Chest x-ray dated 10/01/2013  FINDINGS: There are no pulmonary emboli, infiltrates, or effusions. Heart size is normal. There are multiple calcified lymph nodes in the hilar regions and in the mediastinum including a 2.3 cm calcified lymph node adjacent to the distal esophagus. There is small hiatal hernia.  The lungs are clear.  No osseous abnormalities.  Review of the MIP images confirms the above findings.  IMPRESSION: No pulmonary emboli or other acute abnormalities. Small hiatal hernia.   Electronically Signed   By: Rozetta Nunnery M.D.   On: 10/01/2013 17:21   Dg Chest Port 1 View  10/01/2013   CLINICAL DATA:  Chest pain, shortness of Breath  EXAM: PORTABLE CHEST - 1 VIEW  COMPARISON:  08/27/2010  FINDINGS: Cardiomediastinal silhouette is stable. Central mild vascular congestion without pulmonary edema. No segmental infiltrate. Mild left basilar atelectasis.  IMPRESSION: Central mild vascular congestion without pulmonary edema. Mild left basilar atelectasis.   Electronically Signed   By: Lahoma Crocker M.D.   On: 10/01/2013 16:05    Scheduled Meds: . amLODipine  5 mg Oral Daily  . aspirin  325 mg Oral Daily  . carvedilol  12.5 mg Oral BID  . enoxaparin (LOVENOX) injection  115 mg Subcutaneous BID  . fluticasone  1 spray Each Nare BID  . lisinopril  10 mg Oral QHS  . pantoprazole  40 mg Oral Daily  . simvastatin  40 mg Oral q1800  . sodium chloride  3 mL Intravenous Q12H   Continuous Infusions: . sodium chloride 50 mL/hr at 10/01/13 2100  . nitroGLYCERIN 60 mcg/min (10/02/13 0175)    Principal Problem:   Hypertensive urgency Active Problems:   HYPERLIPIDEMIA   HYPERTENSION   Chest pain    Time spent: >35 minutes     Kinnie Feil  [facility] Pager [phone]. If 7PM-7AM, please contact night-coverage at [email or web address], password [facility] 10/02/2013, 7:37 AM  LOS: 1 day

## 2013-10-02 NOTE — Interval H&P Note (Signed)
Cath Lab Visit (complete for each Cath Lab visit)  Clinical Evaluation Leading to the Procedure:   ACS: yes  Non-ACS:    Anginal Classification: CCS IV  Anti-ischemic medical therapy: Maximal Therapy (2 or more classes of medications)  Non-Invasive Test Results: No non-invasive testing performed  Prior CABG: No previous CABG      History and Physical Interval Note:  10/02/2013 5:54 PM  Christine Barrett  has presented today for surgery, with the diagnosis of CP  The various methods of treatment have been discussed with the patient and family. After consideration of risks, benefits and other options for treatment, the patient has consented to  Procedure(s): LEFT HEART CATHETERIZATION WITH CORONARY ANGIOGRAM (N/A) as a surgical intervention .  The patient's history has been reviewed, patient examined, no change in status, stable for surgery.  I have reviewed the patient's chart and labs.  Questions were answered to the patient's satisfaction.     Pamella Samons A

## 2013-10-03 ENCOUNTER — Encounter (HOSPITAL_COMMUNITY)
Admission: EM | Disposition: A | Discharge status: Home / Self Care | Payer: Self-pay | Source: Home / Self Care | Attending: Internal Medicine

## 2013-10-03 ENCOUNTER — Encounter (HOSPITAL_COMMUNITY): Payer: Self-pay | Admitting: Cardiology

## 2013-10-03 DIAGNOSIS — Z9861 Coronary angioplasty status: Secondary | ICD-10-CM

## 2013-10-03 DIAGNOSIS — I119 Hypertensive heart disease without heart failure: Secondary | ICD-10-CM | POA: Diagnosis present

## 2013-10-03 DIAGNOSIS — R51 Headache: Secondary | ICD-10-CM

## 2013-10-03 DIAGNOSIS — I2510 Atherosclerotic heart disease of native coronary artery without angina pectoris: Secondary | ICD-10-CM

## 2013-10-03 HISTORY — PX: PERCUTANEOUS CORONARY STENT INTERVENTION (PCI-S): SHX5485

## 2013-10-03 HISTORY — PX: CORONARY ANGIOPLASTY: SHX604

## 2013-10-03 LAB — BASIC METABOLIC PANEL
BUN: 7 mg/dL (ref 6–23)
CO2: 19 meq/L (ref 19–32)
Calcium: 8.2 mg/dL — ABNORMAL LOW (ref 8.4–10.5)
Chloride: 104 mEq/L (ref 96–112)
Creatinine, Ser: 0.59 mg/dL (ref 0.50–1.10)
GFR calc Af Amer: >90 mL/min (ref >90)
Glucose, Bld: 110 mg/dL — ABNORMAL HIGH (ref 70–99)
Potassium: 3.4 mEq/L — ABNORMAL LOW (ref 3.7–5.3)
Sodium: 139 mEq/L (ref 137–147)

## 2013-10-03 LAB — CBC
HEMATOCRIT: 30.8 % — AB (ref 36.0–46.0)
Hemoglobin: 10.1 g/dL — ABNORMAL LOW (ref 12.0–15.0)
MCH: 27.2 pg (ref 26.0–34.0)
MCHC: 32.8 g/dL (ref 30.0–36.0)
MCV: 82.8 fL (ref 78.0–100.0)
Platelets: 211 10*3/uL (ref 150–400)
RBC: 3.72 MIL/uL — AB (ref 3.87–5.11)
RDW: 13.7 % (ref 11.5–15.5)
WBC: 10.9 10*3/uL — ABNORMAL HIGH (ref 4.0–10.5)

## 2013-10-03 LAB — PROTIME-INR
INR: 1.07 (ref 0.00–1.49)
Prothrombin Time: 13.7 seconds (ref 11.6–15.2)

## 2013-10-03 LAB — POCT ACTIVATED CLOTTING TIME
Activated Clotting Time: 237 seconds
Activated Clotting Time: 248 seconds
Activated Clotting Time: 282 seconds

## 2013-10-03 SURGERY — PERCUTANEOUS CORONARY STENT INTERVENTION (PCI-S)
Anesthesia: LOCAL

## 2013-10-03 MED ORDER — SODIUM CHLORIDE 0.9 % IV SOLN
INTRAVENOUS | Status: DC
Start: 2013-10-03 — End: 2013-10-04
  Administered 2013-10-03: 75 mL/h via INTRAVENOUS

## 2013-10-03 MED ORDER — SODIUM CHLORIDE 0.9 % IJ SOLN: 3.0000 mL | INTRAMUSCULAR | Status: DC | PRN

## 2013-10-03 MED ORDER — POTASSIUM CHLORIDE CRYS ER 20 MEQ PO TBCR
40.0000 meq | EXTENDED_RELEASE_TABLET | ORAL | Status: AC
  Administered 2013-10-03: 13:00:00 40 meq via ORAL

## 2013-10-03 MED ORDER — NITROGLYCERIN IN D5W 200-5 MCG/ML-% IV SOLN: 2.0000 ug/min | INTRAVENOUS | Status: DC

## 2013-10-03 MED ORDER — NITROGLYCERIN IN D5W 200-5 MCG/ML-% IV SOLN
INTRAVENOUS | Status: AC
  Filled 2013-10-03: qty 250

## 2013-10-03 MED ORDER — HYDROCODONE-ACETAMINOPHEN 5-325 MG PO TABS
ORAL_TABLET | ORAL | Status: AC
  Filled 2013-10-03: qty 1

## 2013-10-03 MED ORDER — ASPIRIN 81 MG PO CHEW
CHEWABLE_TABLET | ORAL | Status: AC
  Filled 2013-10-03: qty 1

## 2013-10-03 MED ORDER — HEPARIN SODIUM (PORCINE) 1000 UNIT/ML IJ SOLN
INTRAMUSCULAR | Status: AC
  Filled 2013-10-03: qty 1

## 2013-10-03 MED ORDER — FENTANYL CITRATE 0.05 MG/ML IJ SOLN
INTRAMUSCULAR | Status: AC
  Filled 2013-10-03: qty 2

## 2013-10-03 MED ORDER — POTASSIUM CHLORIDE CRYS ER 20 MEQ PO TBCR
40.0000 meq | EXTENDED_RELEASE_TABLET | Freq: Once | ORAL | Status: DC
Start: 2013-10-03 — End: 2013-10-03
  Filled 2013-10-03: qty 2

## 2013-10-03 MED ORDER — SODIUM CHLORIDE 0.9 % IJ SOLN: 3.0000 mL | Freq: Two times a day (BID) | INTRAMUSCULAR | Status: DC

## 2013-10-03 MED ORDER — NITROGLYCERIN 0.2 MG/ML ON CALL CATH LAB
INTRAVENOUS | Status: AC
  Filled 2013-10-03: qty 1

## 2013-10-03 MED ORDER — SODIUM CHLORIDE 0.9 % IJ SOLN
3.0000 mL | Freq: Two times a day (BID) | INTRAMUSCULAR | Status: DC
  Administered 2013-10-03 – 2013-10-04 (×2): 3 mL via INTRAVENOUS

## 2013-10-03 MED ORDER — SODIUM CHLORIDE 0.9 % IV SOLN: 1.0000 mL/kg/h | INTRAVENOUS | Status: AC

## 2013-10-03 MED ORDER — MIDAZOLAM HCL 2 MG/2ML IJ SOLN
INTRAMUSCULAR | Status: AC
  Filled 2013-10-03: qty 2

## 2013-10-03 MED ORDER — TICAGRELOR 90 MG PO TABS
ORAL_TABLET | ORAL | Status: AC
  Filled 2013-10-03: qty 1

## 2013-10-03 MED ORDER — HEPARIN (PORCINE) IN NACL 2-0.9 UNIT/ML-% IJ SOLN
INTRAMUSCULAR | Status: AC
  Filled 2013-10-03: qty 1500

## 2013-10-03 MED ORDER — SODIUM CHLORIDE 0.9 % IV SOLN: 250.0000 mL | INTRAVENOUS | Status: DC | PRN

## 2013-10-03 MED ORDER — NITROGLYCERIN IN D5W 200-5 MCG/ML-% IV SOLN: 5.0000 ug/min | INTRAVENOUS | Status: DC

## 2013-10-03 MED ORDER — LIDOCAINE HCL (PF) 1 % IJ SOLN
INTRAMUSCULAR | Status: AC
  Filled 2013-10-03: qty 30

## 2013-10-03 NOTE — CV Procedure (Signed)
CARDIAC CATHETERIZATION AND PERCUTANEOUS CORONARY INTERVENTION REPORT  NAME:  Christine Barrett   MRN: [id number] DOB:  01-17-1961   ADMIT DATE: 10/01/2013 Procedure Date: 10/03/2013  INTERVENTIONAL CARDIOLOGIST: [name], M.D., MS PRIMARY CARE PROVIDER: Default, Provider, MD PRIMARY CARDIOLOGIST: Joanna Hews  PATIENT:  Christine Barrett is a 53 y.o. female with PMH of HTN, HLD & obesity who presented to [facility] ED on 10/02/2103 with severe SSCP radiating to her jaw & L arm while @ [facility].  Described as pressure.  No SOB, diaphoresis.  Antecedent DOE noted, but no exertional CP.  In ER - BP was 230/120 mmHg => with IV NTG, BP reduced & so did CP.  Ruled in for NSTEMI. Cardiac Cath by Dr. Claiborne Billings on 4/6 demonstrated a ~95% lesion in the Ostial AV Groove Circumflex (AVG Cx) as well as a moderate ~60-70% lesion in the mid LAD.  Due to difficulty engaging the LCA from the R Radial approach, the plan was made to stage PTCA of the AVG Cx lesion today.  She was loaded on Brilinta & continued on IV Heparin + NTG overnight. She was CP free, but had a notable HA with nausea.  1 short ~8 beat run of NSVT overnight.  She now presents for re-look LCA Angiography to better delineate her LAD anatomy with possible FFR along with PTCA of the AVG Cx ostial lesion.  PRE-OPERATIVE DIAGNOSIS:    NSTEMI  ACCELERATED HTN - (Hypertensive Emergency) with heart disease but without CHF  Known CAD.  PROCEDURES PERFORMED:    Selective Left Coronary Angiography  Scoring Balloon Angioplasty of the Ostial AV Groove Cirvumflex 95 % lesion reduced to < 30%  PROCEDURE:Consent:  Risks of procedure as well as the alternatives and risks of each were explained to the (patient/caregiver).  Consent for procedure obtained. Consent for signed by MD and patient with RN witness -- placed on chart.  PROCEDURE: The patient was brought to the [facility]nd [facility] in the fasting state and prepped and draped in the  usual sterile fashion for Right groin and Left radial access. A modified Allen's test with plethysmography was performed, revealing excellent Ulnar artery collateral flow.  Sterile technique was used including antiseptics, cap, gloves, gown, hand hygiene, mask and sheet.  Skin prep: Chlorhexidine.  Time Out: Verified patient identification, verified procedure, site/side was marked, verified correct patient position, special equipment/implants available, medications/allergies/relevent history reviewed, required imaging and test results available.  Performed  Access: Left Radial Artery; 6 Fr Sheath -- Seldinger technique (Angiocath Micropuncture Kit)  IA Radial Cocktail - 10 ml, IV Heparin 7000 Units Diagnostic Coronary  Angiography:  a Fr JR 4 catheters was advanced over a long-exchange safety J-wire into the ascending aorta and and exchanged for the 6Fr XB LAD 3.5 Guide Catheter that was used for selective Left Coronary Artery engagement.  Left Coronary Artery Angiography: XB LAD 3.5  TR Band:  1100 Hours, 14 mL air  Hemodynamics:  Central Aortic / Mean Pressures: 135/76 mmHg; 101 mmHg  Coronary Anatomy:  Angiography with a 6 Fr Guide Catheter allowed for excellent visualization of the LAD.  Left Main: Short, large caliber; bifurcates to LAD & Left Circumflex LAD: Large caliber vessel with a proximal D1 and D2 (that coincides with SP1).  Beyond D2, at the takeoff of a major septal trunk (SP2), there is an eccentric 40-60% stenosis.  There is another SP3 shortly after this lesion that has an ostial ~80% stenosis.  Beyond SP3, the LAD  remains large in diameter and tapers down around the apex with minimal luminal irregularities.  D1: Moderate to large caliber vessel with ostial ~30-40% stenosis. It gives off 2 proximal small to moderate caliber side branches before continuing as a moderate caliber High Diagonal branch with minimal luminal irregularities.  D2: small to moderate caliber vessel  with ostial ~20% stenosis.  Left Circumflex: Large caliber vessel that takes an extreme angle takeoff from the Left Main.  The Moderate caliber AV Groove branch (AVG Cx) has a very proximal takeoff with ~95% ostial stenosis.  The AVG Cx then continues to give off several small LPL branches.  Mild luminal irregularities beyond the ostial stenosis.  OM1: The Main Circumflex continues beyond the AVG Cx as a large Lateral OM1 that bifurcates distally after a ~30% focal lesion into ~OM1a & b.  OM 1a:  bifurcates proximally into a small caliber superior and moderate caliber inferior branch; minimal luminal irregularities.  OM 1b: moderate caliber vessel that bifurcates distally into a small and small-moderate caliber branches;  minimal luminal irregularities.   After reviewing the initial angiography, the culprit lesion was thought to be the 95% ostial AV Groove Cx lesion.  Preparation were made to proceed with PTCA on this lesion.  The LAD lesion was not felt to be angiographically significant & ok for medical management.  Percutaneous Coronary Intervention:  Sheath exchanged for 6 Fr Guide: 6 Fr   XB LAD 3.5 Guidewire: Runthrough (AVG Cx), Luge - Cx.  Due to difficulty with 2 90 Deg turns to cannulate the culprit vessel, a SuperCross over-the-wire Catheter was used with a docking extension on the Runthrough wire --> with the Supercross in place, the AV Groove Cx was successfully entered with the wire. Predilation Balloon: MiniTrek 2.0 mm x 8 mm;   Several attempted inflations to 4 Atm - aborted due to balloon shifting  4 Atm x 45 Sec, 6 Atm x 60 Sec AngioSculpt Balloon: 2.5 mm x 6 mm;   4 Atm x 45 Sec, 6 Atm x 90  Final Diameter: ~2.5 mm - initial reduction of stenosis to < 20%;   Post deployment angiography in multiple views, with and without guidewire in place revealed only minimal recoil of the lesion to < 30%.  There was no evidence of dissection or  perforation.  MEDICATIONS:  Anesthesia:  Local Lidocaine 2 ml  Sedation:  3 mg IV Versed, 75 mcg IV fentanyl ;   Premedication: ASA 81 mg  Omnipaque Contrast: 250 ml  Anticoagulation:  IV Heparin 7000 + 2 additional boluses of 2000 Units - for ACT 250-350 Sec  Anti-Platelet Agent:  Brilinta 90 mg (180 mg given last PM  PATIENT DISPOSITION:    The patient was transferred to the PACU holding area in a hemodynamicaly stable, chest pain free condition.  The patient tolerated the procedure well, and there were no complications.  EBL:   < 10 ml  The patient was stable before, during, and after the procedure.  POST-OPERATIVE DIAGNOSIS:    Severe Ostial AV Groove Circumflex lesion with only moderate mid LAD stenosis.  Successful Scoring Balloon PTCA of the Ostial AV Groove Circumflex with a 2.5 mm AngioSculpt balloon reducing the 95% stenosis to < 30%.  PLAN OF CARE:  Return to [facility] for standard post-radial cath care.  Continue aggressive BP control - weaning NTG gtt overnight.  DAPT for minimum of 3 months - to 1 yr.  Can convert to Plavix after 3 months.  Expect D/c in AM if BP controlled.   [name], M.D., M.S. [facility] [address] [place], [place]  [address]  [phone]  10/03/2013 11:02 AM

## 2013-10-03 NOTE — Progress Notes (Signed)
[facility] PROGRESS NOTE  Christine Barrett NIO:[id number] DOB: 10/11/1960 DOA: 10/01/2013 PCP: Default, Provider, MD  Assessment/Plan: Principal Problem:  NSTEMI Hypertensive urgency  Active Problems:  HYPERLIPIDEMIA  HYPERTENSION  Chest pain   53 y.o. female with PMH of HTN, HPL, not taking any medications presented with substernal chest pain; she started having sudden onset non exertional chest pain, NSTEMI, HTN emergency  1. NSTEMI/ACS; CTA chest unremarkable; chest pain resolved   -wean IV NTG, started ASA, BB, statin; anticoagulation; pend echo; s/p LHC   2. HTN emergency with ACS; non adherence to medications  -BP improved on IV NTG/taper; started BB, amlodipine; titrate per response   3. HPL started statin  4. Leukocytosis of unclear etiology; ? Stress related; no clear s/s of infection; CT chest, UA Unremarkable; -resolving    Code Status: full  Family Communication: d/w patient Disposition Plan: home in AM?   Consultants:  Cardiology   Procedures:  LHC  Antibiotics:    HPI/Subjective: C/o headache  Objective: Filed Vitals:   10/03/13 1207  BP: 157/76  Pulse: 82  Temp: 99.6 F (37.6 C)  Resp:     Intake/Output Summary (Last 24 hours) at 10/03/13 1250 Last data filed at 10/03/13 1100  Gross per 24 hour  Intake 131.82 ml  Output    150 ml  Net -18.18 ml   Filed Weights   10/02/13 0400 10/02/13 2008 10/02/13 2357  Weight: 114.2 kg (251 lb 12.3 oz) 114 kg (251 lb 5.2 oz) 114 kg (251 lb 5.2 oz)    Exam:   General:  alert  Cardiovascular: s1,s2 rrr  Respiratory: CTA BL  Abdomen: soft,nt,nd   Musculoskeletal: no L E edema   Data Reviewed: Basic Metabolic Panel:  Recent Labs Lab 10/01/13 1522 10/02/13 0334 10/03/13 0552  NA 140 139 139  K 4.3 4.2 3.4*  CL 102 105 104  CO2 22 16* 19  GLUCOSE 102* 113* 110*  BUN 10 10 7   CREATININE 0.71 0.56 0.59  CALCIUM 10.0 8.6 8.2*   Liver Function Tests: No results found for this  basename: AST, ALT, ALKPHOS, BILITOT, PROT, ALBUMIN,  in the last 168 hours No results found for this basename: LIPASE, AMYLASE,  in the last 168 hours No results found for this basename: AMMONIA,  in the last 168 hours CBC:  Recent Labs Lab 10/01/13 1522 10/02/13 0334 10/03/13 0552  WBC 14.3* 11.2* 10.9*  HGB 12.3 10.1* 10.1*  HCT 38.1 31.3* 30.8*  MCV 82.6 83.7 82.8  PLT 297 PLATELET CLUMPS NOTED ON SMEAR, COUNT APPEARS ADEQUATE 211   Cardiac Enzymes:  Recent Labs Lab 10/01/13 2102 10/02/13 0334 10/02/13 0821  TROPONINI 3.66* 7.59* 9.39*   BNP (last 3 results)  Recent Labs  10/01/13 1522  PROBNP 98.9   CBG: No results found for this basename: GLUCAP,  in the last 168 hours  Recent Results (from the past 240 hour(s))  MRSA PCR SCREENING     Status: None   Collection Time    10/01/13  7:15 PM      Result Value Ref Range Status   MRSA by PCR NEGATIVE  NEGATIVE Final   Comment:            The GeneXpert MRSA Assay (FDA     approved for NASAL specimens     only), is one component of a     comprehensive MRSA colonization     surveillance program. It is not     intended to diagnose MRSA  infection nor to guide or     monitor treatment for     MRSA infections.     Studies: Ct Angio Chest Pe W/cm &/or Wo Cm  10/01/2013   CLINICAL DATA:  Sharp chest pain and shortness of breath.  EXAM: CT ANGIOGRAPHY CHEST WITH CONTRAST  TECHNIQUE: Multidetector CT imaging of the chest was performed using the standard protocol during bolus administration of intravenous contrast. Multiplanar CT image reconstructions and MIPs were obtained to evaluate the vascular anatomy.  CONTRAST:  128mL OMNIPAQUE IOHEXOL 350 MG/ML SOLN  COMPARISON:  Chest x-ray dated 10/01/2013  FINDINGS: There are no pulmonary emboli, infiltrates, or effusions. Heart size is normal. There are multiple calcified lymph nodes in the hilar regions and in the mediastinum including a 2.3 cm calcified lymph node adjacent  to the distal esophagus. There is small hiatal hernia.  The lungs are clear.  No osseous abnormalities.  Review of the MIP images confirms the above findings.  IMPRESSION: No pulmonary emboli or other acute abnormalities. Small hiatal hernia.   Electronically Signed   By: Rozetta Nunnery M.D.   On: 10/01/2013 17:21   Dg Chest Port 1 View  10/01/2013   CLINICAL DATA:  Chest pain, shortness of Breath  EXAM: PORTABLE CHEST - 1 VIEW  COMPARISON:  08/27/2010  FINDINGS: Cardiomediastinal silhouette is stable. Central mild vascular congestion without pulmonary edema. No segmental infiltrate. Mild left basilar atelectasis.  IMPRESSION: Central mild vascular congestion without pulmonary edema. Mild left basilar atelectasis.   Electronically Signed   By: Lahoma Crocker M.D.   On: 10/01/2013 16:05    Scheduled Meds: . amLODipine  5 mg Oral Daily  . aspirin EC  81 mg Oral Daily  . atorvastatin  40 mg Oral q1800  . carvedilol  12.5 mg Oral BID  . fluticasone  1 spray Each Nare BID  . lisinopril  10 mg Oral QHS  . pantoprazole  40 mg Oral Daily  . sodium chloride  3 mL Intravenous Q12H  . sodium chloride  3 mL Intravenous Q12H  . Ticagrelor  90 mg Oral BID   Continuous Infusions: . sodium chloride 150 mL/hr at 10/02/13 1928  . sodium chloride    . sodium chloride 1 mL/kg/hr (10/03/13 1131)  . nitroGLYCERIN    . nitroGLYCERIN      Principal Problem:   Accelerated hypertension with heart disease and without congestive heart failure Active Problems:   HYPERLIPIDEMIA   HYPERTENSION   Hypertensive urgency   Chest pain   Hyperlipemia   Anemia   NSTEMI (non-ST elevated myocardial infarction)   Hypertensive emergency   CAD S/P percutaneous coronary angioplasty - Ostial AVG Cx - Scoring balloon PTCA    Time spent: 25 minutes     Britlyn Martine  [facility] Pager [phone]. If 7PM-7AM, please contact night-coverage at [email or web address], password [facility] 10/03/2013, 12:50 PM  LOS: 2 days

## 2013-10-03 NOTE — Interval H&P Note (Signed)
History and Physical Interval Note:  10/03/2013 7:57 AM  Leana Roe  has presented today for surgery, with the diagnosis of planned FFR +/- PCI. The various methods of treatment have been discussed with the patient and family. After consideration of risks, benefits and other options for treatment, the patient has consented to  Procedure(s): PERCUTANEOUS CORONARY STENT INTERVENTION (PCI-S) (N/A) as a surgical intervention .  [name]  Cath Lab Visit (complete for each Cath Lab visit)  Clinical Evaluation Leading to the Procedure:   ACS: yes  Non-ACS:    Anginal Classification: CCS IV  Anti-ischemic medical therapy: Maximal Therapy (2 or more classes of medications)  Non-Invasive Test Results: No non-invasive testing performed  Prior CABG: No previous CABG    The patient's history has been reviewed, patient examined, no change in status, stable for surgery.  I have reviewed the patient's chart and labs.  Questions were answered to the patient's satisfaction.     HARDING,DAVID W

## 2013-10-03 NOTE — Care Management Note (Addendum)
  Page 2 of 2   10/03/2013     5:21:15 PM   CARE MANAGEMENT NOTE 10/03/2013  Patient:  Christine Barrett, Christine Barrett   Account Number:  [id number]  Date Initiated:  10/03/2013  Documentation initiated by:  Christine Barrett  Subjective/Objective Assessment:   Admitted with CP, Planned PCI     Action/Plan:   CM to follow for disposition needs   Anticipated DC Date:  10/04/2013   Anticipated DC Plan:  [place]  CM consult      Choice offered to / List presented to:             Status of service:  In process, will continue to follow Medicare Important Message given?   (If response is "NO", the following Medicare IM given date fields will be blank) Date Medicare IM given:   Date Additional Medicare IM given:    Discharge Disposition:    Per UR Regulation:  Reviewed for med. necessity/level of care/duration of stay  If discussed at [place] of Stay Meetings, dates discussed:    Comments:  10/03/2013 12:04pm  [place], RN, BSN, MSHL, CCM Ticagrelor (BRILINTA) tablet 90 mg by mouth  2 times daily Review for  Benefits check for coverage; co-pay; authorizations; and any deductibles still not met. PT DOES NOT HAVE INSURANCE AT THIS TIME-MEDICAID [place] CM Consult:  Medication Assistance pending  Social:  Married from home with husband.  Patient works on commision only.  States no insurance coverage and has not applied for MCD d/t does not believe she will meet eligibility. CM provided VE on $4.00 med list and instructed on use. CM confirmed that patient consents to f/u appt with establishing PCP care through [facility]. CM provided copy of [employer] application and instructed on application process for eligibility. CM Left Message with [facility] requesting call back for PCP and [employer] Card appt and to see if able to provide patient with meds at discharge.  10/03/13 CM Message to  Dr. Eliseo Squires, Please see document in chart for Self Pay / no coverage for Brilinta to  complete for med assistance.  Patient has 30 day free option until med assist can be processed. Please prescribe as many possible meds from $4.00 list also left in chart to facilitate compliance with getting and affording meds. Thxs, Zakee Deerman Case Freight forwarder, [phone] [place]  pending [place] ___________pending [facility] PCP Apt[address] _______________pending Christine Laster RN, BSN, [place], CCM 10/03/2013

## 2013-10-03 NOTE — H&P (View-Only) (Signed)
Patient Name: Christine Barrett Date of Encounter: 10/03/2013  Principal Problem:   Hypertensive urgency Active Problems:   HYPERLIPIDEMIA   HYPERTENSION   Chest pain   Hyperlipemia   Anemia   Hyperlipidemia   NSTEMI (non-ST elevated myocardial infarction)   Hypertensive emergency    Patient Profile: 53 yo female w/ HTN, HL, not on meds PTA was admitted 04/05 w/ chest pain, NSTEMI and hypertensive urgency/emergency.   SUBJECTIVE: No chest pain or SOB; no palpitations (ever). Not aware of arrhythmia  OBJECTIVE Filed Vitals:   10/03/13 0150 10/03/13 0200 10/03/13 0409 10/03/13 0600  BP: 138/66 161/71 155/67 164/82  Pulse: 101 101 100   Temp:   99.4 F (37.4 C)   TempSrc:   Oral   Resp:   17   Height:      Weight:      SpO2: 97% 98% 94%     Intake/Output Summary (Last 24 hours) at 10/03/13 0727 Last data filed at 10/03/13 0600  Gross per 24 hour  Intake 926.82 ml  Output    200 ml  Net 726.82 ml   Filed Weights   10/02/13 0400 10/02/13 2008 10/02/13 2357  Weight: 251 lb 12.3 oz (114.2 kg) 251 lb 5.2 oz (114 kg) 251 lb 5.2 oz (114 kg)    PHYSICAL EXAM General: Well developed, well nourished, female in no acute distress. Head: Normocephalic, atraumatic.  Neck: Supple without bruits, JVD not elevated Lungs:  Resp regular and unlabored, CTA. Heart: RRR, S1, S2, no S3, S4, or murmur; no rub. Abdomen: Soft, non-tender, non-distended, BS + x 4.  Extremities: No clubbing, cyanosis, no edema. Right radial cath site w/out ecchymosis/hematoma Neuro: Alert and oriented X 3. Moves all extremities spontaneously. Psych: Normal affect.  LABS: CBC: Recent Labs  10/02/13 0334 10/03/13 0552  WBC 11.2* 10.9*  HGB 10.1* 10.1*  HCT 31.3* 30.8*  MCV 83.7 82.8  PLT CLUMPS ON SMEAR,  COUNT APPEARS ADEQUATE 211   INR: Recent Labs  10/03/13 0552  INR 4.54   Basic Metabolic Panel: Recent Labs  10/02/13 0334 10/03/13 0552  NA 139 139  K 4.2 3.4*  CL 105 104  CO2  16* 19  GLUCOSE 113* 110*  BUN 10 7  CREATININE 0.56 0.59  CALCIUM 8.6 8.2*   Cardiac Enzymes: Recent Labs  10/01/13 2102 10/02/13 0334 10/02/13 0821  TROPONINI 3.66* 7.59* 9.39*    Recent Labs  10/01/13 1529  TROPIPOC 0.00   BNP: Pro B Natriuretic peptide (BNP)  Date/Time Value Ref Range Status  10/01/2013  3:22 PM 98.9  0 - 125 pg/mL Final  08/27/2010  2:30 PM <30.0  0.0 - 100.0 pg/mL Final   Hemoglobin A1C: Recent Labs  10/01/13 2102  HGBA1C 5.9*   Fasting Lipid Panel: Recent Labs  10/01/13 2104  CHOL 251*  HDL 44  LDLCALC 185*  TRIG 110  CHOLHDL 5.7   Thyroid Function Tests: Recent Labs  10/01/13 2104  TSH 2.620   TELE:  SR, brief run NSVT  Radiology/Studies: Ct Angio Chest Pe W/cm &/or Wo Cm 10/01/2013   CLINICAL DATA:  Sharp chest pain and shortness of breath.  EXAM: CT ANGIOGRAPHY CHEST WITH CONTRAST  TECHNIQUE: Multidetector CT imaging of the chest was performed using the standard protocol during bolus administration of intravenous contrast. Multiplanar CT image reconstructions and MIPs were obtained to evaluate the vascular anatomy.  CONTRAST:  166mL OMNIPAQUE IOHEXOL 350 MG/ML SOLN  COMPARISON:  Chest x-ray dated 10/01/2013  FINDINGS:  There are no pulmonary emboli, infiltrates, or effusions. Heart size is normal. There are multiple calcified lymph nodes in the hilar regions and in the mediastinum including a 2.3 cm calcified lymph node adjacent to the distal esophagus. There is small hiatal hernia.  The lungs are clear.  No osseous abnormalities.  Review of the MIP images confirms the above findings.  IMPRESSION: No pulmonary emboli or other acute abnormalities. Small hiatal hernia.   Electronically Signed   By: Rozetta Nunnery M.D.   On: 10/01/2013 17:21   Dg Chest Port 1 View 10/01/2013   CLINICAL DATA:  Chest pain, shortness of Breath  EXAM: PORTABLE CHEST - 1 VIEW  COMPARISON:  08/27/2010  FINDINGS: Cardiomediastinal silhouette is stable. Central mild  vascular congestion without pulmonary edema. No segmental infiltrate. Mild left basilar atelectasis.  IMPRESSION: Central mild vascular congestion without pulmonary edema. Mild left basilar atelectasis.   Electronically Signed   By: Lahoma Crocker M.D.   On: 10/01/2013 16:05   Current Medications:  . amLODipine  5 mg Oral Daily  . aspirin EC  81 mg Oral Daily  . atorvastatin  40 mg Oral q1800  . carvedilol  12.5 mg Oral BID  . fluticasone  1 spray Each Nare BID  . lisinopril  10 mg Oral QHS  . pantoprazole  40 mg Oral Daily  . sodium chloride  3 mL Intravenous Q12H  . Ticagrelor  90 mg Oral BID   . sodium chloride 150 mL/hr at 10/02/13 1928  . heparin 1,300 Units/hr (10/03/13 0600)  . nitroGLYCERIN 40 mcg/min (10/03/13 0600)    ASSESSMENT AND PLAN:   NSTEMI (non-ST elevated myocardial infarction) - see cath note 04/06. Needs PCI, MD to review, scheduled for today.    NSVT - BB not given last pm (EPIC problem). No sx, not aware. Continue BB, may need to increase dose.  Otherwise, per IM. Principal Problem:   Hypertensive urgency Active Problems:   HYPERLIPIDEMIA   HYPERTENSION   Chest pain   Hyperlipemia   Anemia   Hyperlipidemia   Hypertensive emergency   Signed, Rosaria Ferries , PA-C 7:27 AM 10/03/2013  I have seen and evaluated the patient this AM along with Rosaria Ferries , PA-C. I agree with her findings, examination as well as impression recommendations.  NSTEMI - with cath yesterday - showed ~95% ostial AVG Cx lesion, but not adequate visualization of LAD that has ~60-70% lesion @ SP1.  . Remains Hypertensive - nauseated on NTG gtt; short bust of NSVT last PM - replete K & ensure BB dose administered (not given post cath).  I have reviewed the cath films - I think that the LAD needs better visualization with likely FFR of the proximal-mid lesion with PCI if indicated.  The AVG Cx is not Stent-able, will be difficult to wire.  Will attempt AngioSculpt /Cutting Balloon  angioplasty of this lesion, but not aggressive to avoid injury to native Cx.  Procedure with R/B/A/I explained in detail.  All questions were answered.    Risks / Complications include, but not limited to: Death, MI, CVA/TIA, VF/VT (with defibrillation), Bradycardia (need for temporary pacer placement), contrast induced nephropathy, bleeding / bruising / hematoma / pseudoaneurysm, vascular or coronary injury (with possible emergent CT or Vascular Surgery), adverse medication reactions, infection.    The patient voices understanding and agree to proceed.     [name], M.D., M.S. Interventional Cardiologist  [place] Pager # [phone] 10/03/2013

## 2013-10-03 NOTE — Progress Notes (Addendum)
Patient Name: Christine Barrett Date of Encounter: 10/03/2013  Principal Problem:   Hypertensive urgency Active Problems:   HYPERLIPIDEMIA   HYPERTENSION   Chest pain   Hyperlipemia   Anemia   Hyperlipidemia   NSTEMI (non-ST elevated myocardial infarction)   Hypertensive emergency    Patient Profile: 53 yo female w/ HTN, HL, not on meds PTA was admitted 04/05 w/ chest pain, NSTEMI and hypertensive urgency/emergency.   SUBJECTIVE: No chest pain or SOB; no palpitations (ever). Not aware of arrhythmia  OBJECTIVE Filed Vitals:   10/03/13 0150 10/03/13 0200 10/03/13 0409 10/03/13 0600  BP: 138/66 161/71 155/67 164/82  Pulse: 101 101 100   Temp:   99.4 F (37.4 C)   TempSrc:   Oral   Resp:   17   Height:      Weight:      SpO2: 97% 98% 94%     Intake/Output Summary (Last 24 hours) at 10/03/13 0727 Last data filed at 10/03/13 0600  Gross per 24 hour  Intake 926.82 ml  Output    200 ml  Net 726.82 ml   Filed Weights   10/02/13 0400 10/02/13 2008 10/02/13 2357  Weight: 251 lb 12.3 oz (114.2 kg) 251 lb 5.2 oz (114 kg) 251 lb 5.2 oz (114 kg)    PHYSICAL EXAM General: Well developed, well nourished, female in no acute distress. Head: Normocephalic, atraumatic.  Neck: Supple without bruits, JVD not elevated Lungs:  Resp regular and unlabored, CTA. Heart: RRR, S1, S2, no S3, S4, or murmur; no rub. Abdomen: Soft, non-tender, non-distended, BS + x 4.  Extremities: No clubbing, cyanosis, no edema. Right radial cath site w/out ecchymosis/hematoma Neuro: Alert and oriented X 3. Moves all extremities spontaneously. Psych: Normal affect.  LABS: CBC: Recent Labs  10/02/13 0334 10/03/13 0552  WBC 11.2* 10.9*  HGB 10.1* 10.1*  HCT 31.3* 30.8*  MCV 83.7 82.8  PLT CLUMPS ON SMEAR,  COUNT APPEARS ADEQUATE 211   INR: Recent Labs  10/03/13 0552  INR 8.50   Basic Metabolic Panel: Recent Labs  10/02/13 0334 10/03/13 0552  NA 139 139  K 4.2 3.4*  CL 105 104  CO2  16* 19  GLUCOSE 113* 110*  BUN 10 7  CREATININE 0.56 0.59  CALCIUM 8.6 8.2*   Cardiac Enzymes: Recent Labs  10/01/13 2102 10/02/13 0334 10/02/13 0821  TROPONINI 3.66* 7.59* 9.39*    Recent Labs  10/01/13 1529  TROPIPOC 0.00   BNP: Pro B Natriuretic peptide (BNP)  Date/Time Value Ref Range Status  10/01/2013  3:22 PM 98.9  0 - 125 pg/mL Final  08/27/2010  2:30 PM <30.0  0.0 - 100.0 pg/mL Final   Hemoglobin A1C: Recent Labs  10/01/13 2102  HGBA1C 5.9*   Fasting Lipid Panel: Recent Labs  10/01/13 2104  CHOL 251*  HDL 44  LDLCALC 185*  TRIG 110  CHOLHDL 5.7   Thyroid Function Tests: Recent Labs  10/01/13 2104  TSH 2.620   TELE:  SR, brief run NSVT  Radiology/Studies: Ct Angio Chest Pe W/cm &/or Wo Cm 10/01/2013   CLINICAL DATA:  Sharp chest pain and shortness of breath.  EXAM: CT ANGIOGRAPHY CHEST WITH CONTRAST  TECHNIQUE: Multidetector CT imaging of the chest was performed using the standard protocol during bolus administration of intravenous contrast. Multiplanar CT image reconstructions and MIPs were obtained to evaluate the vascular anatomy.  CONTRAST:  116mL OMNIPAQUE IOHEXOL 350 MG/ML SOLN  COMPARISON:  Chest x-ray dated 10/01/2013  FINDINGS:  There are no pulmonary emboli, infiltrates, or effusions. Heart size is normal. There are multiple calcified lymph nodes in the hilar regions and in the mediastinum including a 2.3 cm calcified lymph node adjacent to the distal esophagus. There is small hiatal hernia.  The lungs are clear.  No osseous abnormalities.  Review of the MIP images confirms the above findings.  IMPRESSION: No pulmonary emboli or other acute abnormalities. Small hiatal hernia.   Electronically Signed   By: Rozetta Nunnery M.D.   On: 10/01/2013 17:21   Dg Chest Port 1 View 10/01/2013   CLINICAL DATA:  Chest pain, shortness of Breath  EXAM: PORTABLE CHEST - 1 VIEW  COMPARISON:  08/27/2010  FINDINGS: Cardiomediastinal silhouette is stable. Central mild  vascular congestion without pulmonary edema. No segmental infiltrate. Mild left basilar atelectasis.  IMPRESSION: Central mild vascular congestion without pulmonary edema. Mild left basilar atelectasis.   Electronically Signed   By: Lahoma Crocker M.D.   On: 10/01/2013 16:05   Current Medications:  . amLODipine  5 mg Oral Daily  . aspirin EC  81 mg Oral Daily  . atorvastatin  40 mg Oral q1800  . carvedilol  12.5 mg Oral BID  . fluticasone  1 spray Each Nare BID  . lisinopril  10 mg Oral QHS  . pantoprazole  40 mg Oral Daily  . sodium chloride  3 mL Intravenous Q12H  . Ticagrelor  90 mg Oral BID   . sodium chloride 150 mL/hr at 10/02/13 1928  . heparin 1,300 Units/hr (10/03/13 0600)  . nitroGLYCERIN 40 mcg/min (10/03/13 0600)    ASSESSMENT AND PLAN:   NSTEMI (non-ST elevated myocardial infarction) - see cath note 04/06. Needs PCI, MD to review, scheduled for today.    NSVT - BB not given last pm (EPIC problem). No sx, not aware. Continue BB, may need to increase dose.  Otherwise, per IM. Principal Problem:   Hypertensive urgency - Accelerated HTN with Heart Disease & no CHF - monitored & treated. Active Problems:   HYPERLIPIDEMIA   HYPERTENSION   Chest pain   Hyperlipemia   Anemia   Hyperlipidemia   Hypertensive emergency   Signed, Rosaria Ferries , PA-C 7:27 AM 10/03/2013  I have seen and evaluated the patient this AM along with Rosaria Ferries , PA-C. I agree with her findings, examination as well as impression recommendations.  NSTEMI - with cath yesterday - showed ~95% ostial AVG Cx lesion, but not adequate visualization of LAD that has ~60-70% lesion @ SP1.  . Remains Hypertensive - nauseated on NTG gtt; short bust of NSVT last PM - replete K & ensure BB dose administered (not given post cath).  I have reviewed the cath films - I think that the LAD needs better visualization with likely FFR of the proximal-mid lesion with PCI if indicated.  The AVG Cx is not Stent-able, will  be difficult to wire.  Will attempt AngioSculpt /Cutting Balloon angioplasty of this lesion, but not aggressive to avoid injury to native Cx.  Procedure with R/B/A/I explained in detail.  All questions were answered.    Risks / Complications include, but not limited to: Death, MI, CVA/TIA, VF/VT (with defibrillation), Bradycardia (need for temporary pacer placement), contrast induced nephropathy, bleeding / bruising / hematoma / pseudoaneurysm, vascular or coronary injury (with possible emergent CT or Vascular Surgery), adverse medication reactions, infection.    The patient voices understanding and agree to proceed.     [name], M.D., M.S. Interventional Cardiologist  [facility]  [place] Pager # [phone] 10/03/2013

## 2013-10-03 NOTE — Progress Notes (Signed)
Patient had11 beat NSVT while sleeping BP 138/66 she denies any chest pain only complaint is headache. I notified MD on call made him aware. Plan is to continue to monitor patient.

## 2013-10-03 NOTE — Progress Notes (Signed)
TR BAND REMOVAL  LOCATION:    left radial  DEFLATED PER PROTOCOL:    yes  TIME BAND OFF / DRESSING APPLIED:    1430   SITE UPON ARRIVAL:    Level 0  SITE AFTER BAND REMOVAL:    Level 0  REVERSE ALLEN'S TEST:     positive  CIRCULATION SENSATION AND MOVEMENT:    Within Normal Limits   yes  COMMENTS:   Tolerated procedure well

## 2013-10-03 NOTE — Clinical Documentation Improvement (Signed)
10/03/13 Noted per Prog Note & H/P..."53 yo female w/ HTN, HL, not on meds PTA was admitted 04/05 w/ chest pain, NSTEMI and hypertensive urgency/emergency.".Marland KitchenMarland Kitchen"Remains Hypertensive - nauseated on NTG gtt; short bust of NSVT last PM - replete K & ensure BB dose administered (not given post cath)."  For accurate Dx specificity & severity can noted "hypertensive urgency/emergency" be further specified for cond being eval'd, mon'd & tx'd. Thank you  Possible Clinical Conditions?  Accelerated Hypertension Malignant Hypertension Or Other Condition Cannot Clinically Determine   Supporting Information: Risk Factors: See above note Signs and Symptoms: See above note Treatment: See above note Diagnostics: See above note   Thank You, Ermelinda Das, RN, BSN, CCDS Certified Clinical Documentation Specialist Pager: [facility]

## 2013-10-04 LAB — CBC
HCT: 30.7 % — ABNORMAL LOW (ref 36.0–46.0)
Hemoglobin: 10 g/dL — ABNORMAL LOW (ref 12.0–15.0)
MCH: 27.1 pg (ref 26.0–34.0)
MCHC: 32.6 g/dL (ref 30.0–36.0)
MCV: 83.2 fL (ref 78.0–100.0)
Platelets: 216 10*3/uL (ref 150–400)
RBC: 3.69 MIL/uL — AB (ref 3.87–5.11)
RDW: 13.9 % (ref 11.5–15.5)
WBC: 8.6 10*3/uL (ref 4.0–10.5)

## 2013-10-04 LAB — BASIC METABOLIC PANEL
BUN: 8 mg/dL (ref 6–23)
CHLORIDE: 107 meq/L (ref 96–112)
CO2: 20 meq/L (ref 19–32)
CREATININE: 0.67 mg/dL (ref 0.50–1.10)
Calcium: 8.8 mg/dL (ref 8.4–10.5)
GFR calc Af Amer: >90 mL/min (ref >90)
GFR calc non Af Amer: >90 mL/min (ref >90)
GLUCOSE: 105 mg/dL — AB (ref 70–99)
Potassium: 3.6 mEq/L — ABNORMAL LOW (ref 3.7–5.3)
Sodium: 141 mEq/L (ref 137–147)

## 2013-10-04 MED ORDER — LOVASTATIN 20 MG PO TABS: 20.0000 mg | ORAL_TABLET | Freq: Every day | ORAL | Status: DC

## 2013-10-04 MED ORDER — ASPIRIN 81 MG PO TBEC: 81.0000 mg | DELAYED_RELEASE_TABLET | Freq: Every day | ORAL | Status: DC

## 2013-10-04 MED ORDER — TICAGRELOR 90 MG PO TABS: 90.0000 mg | ORAL_TABLET | Freq: Two times a day (BID) | ORAL | Status: DC

## 2013-10-04 MED ORDER — CARVEDILOL 25 MG PO TABS
25.0000 mg | ORAL_TABLET | Freq: Two times a day (BID) | ORAL | Status: DC
  Administered 2013-10-04: 25 mg via ORAL
  Filled 2013-10-04 (×3): qty 1

## 2013-10-04 MED ORDER — CARVEDILOL 25 MG PO TABS: 25.0000 mg | ORAL_TABLET | Freq: Two times a day (BID) | ORAL | Status: DC

## 2013-10-04 MED ORDER — LISINOPRIL 20 MG PO TABS: 20.0000 mg | ORAL_TABLET | Freq: Every day | ORAL | Status: DC

## 2013-10-04 NOTE — Discharge Summary (Signed)
Physician Discharge Summary  Christine Barrett SWF:[id number] DOB: 10/24/1960 DOA: 10/01/2013  PCP: Default, Provider, MD  Admit date: 10/01/2013 Discharge date: 10/04/2013  Time spent: 35 minutes  Recommendations for Outpatient Follow-up:  1. FLP, LFTS 6 weeks  Discharge Diagnoses:  Principal Problem:   Accelerated hypertension with heart disease and without congestive heart failure Active Problems:   HYPERLIPIDEMIA   HYPERTENSION   Hypertensive urgency   Chest pain   Hyperlipemia   Anemia   NSTEMI (non-ST elevated myocardial infarction)   Hypertensive emergency   CAD S/P percutaneous coronary angioplasty - Ostial AVG Cx - Scoring balloon PTCA   Discharge Condition: improved  Diet recommendation: cardiac  Filed Weights   10/02/13 2008 10/02/13 2357 10/04/13 0009  Weight: 114 kg (251 lb 5.2 oz) 114 kg (251 lb 5.2 oz) 114.7 kg (252 lb 13.9 oz)    History of present illness:  Christine Barrett is a 53 y.o. female with PMH of HTN, HPL, not taking any medications presented with substernal chest pain; she started having sudden onset non exertional chest pain while sitting at church around 3.30 pm today; she reports pain travelling to her L arm, no SOB, no cough, no fever; denies nausea, vomiting or diarrhea; denies exertional chest pain, but has DOE  -In ED she found to have BP 233/120 started IV NTG which improved BP, and relieved the chest pain    Hospital Course:  NSTEMI/ACS; CTA chest unremarkable; chest pain resolved  - s/p LHC  -stent palced -brilenta  HTN emergency with ACS; non adherence to medications  - started BB, lisinopril- tried to only prescribe meds on the $4 list as patient has no insurance  HPL started statin   Leukocytosis of unclear etiology; ? Stress related; no clear s/s of infection; CT chest, UA Unremarkable;  -resolving   Procedures: s/p PCI CFX, PTCA w/ Angiosculpt cutting balloon   Consultations:  cards  Discharge Exam: Filed Vitals:   10/04/13  1045  BP: 138/71  Pulse: 90  Temp: 98.3 F (36.8 C)  Resp: 18    General: A+Ox3, NAd Cardiovascular: rrr Respiratory: clear  Discharge Instructions You were cared for by a hospitalist during your hospital stay. If you have any questions about your discharge medications or the care you received while you were in the hospital after you are discharged, you can call the unit and asked to speak with the hospitalist on call if the hospitalist that took care of you is not available. Once you are discharged, your primary care physician will handle any further medical issues. Please note that NO REFILLS for any discharge medications will be authorized once you are discharged, as it is imperative that you return to your primary care physician (or establish a relationship with a primary care physician if you do not have one) for your aftercare needs so that they can reassess your need for medications and monitor your lab values.  Discharge Orders   Future Appointments Provider Department Dept Phone   10/18/2013 9:00 AM Renella Cunas, MD [place] [phone]   10/18/2013 10:00 AM [facility] [place] [phone]   Future Orders Complete By Expires   Amb Referral to Cardiac Rehabilitation  As directed    Diet - low sodium heart healthy  As directed    Increase activity slowly  As directed        Medication List    STOP taking these medications  lisinopril-hydrochlorothiazide 20-25 MG per tablet  Commonly known as:  PRINZIDE,ZESTORETIC      TAKE these medications       aspirin 81 MG EC tablet  Take 1 tablet (81 mg total) by mouth daily.     carvedilol 25 MG tablet  Commonly known as:  COREG  Take 1 tablet (25 mg total) by mouth 2 (two) times daily.     fluticasone 50 MCG/ACT nasal spray  Commonly known as:  FLONASE  Place 1 spray into both nostrils 2 (two) times daily.     lisinopril 20 MG tablet   Commonly known as:  PRINIVIL,ZESTRIL  Take 1 tablet (20 mg total) by mouth at bedtime.     lovastatin 20 MG tablet  Commonly known as:  MEVACOR  Take 1 tablet (20 mg total) by mouth at bedtime.     Ticagrelor 90 MG Tabs tablet  Commonly known as:  BRILINTA  Take 1 tablet (90 mg total) by mouth 2 (two) times daily.       Allergies  Allergen Reactions  . Penicillins Rash    Pt states she has taken since intial  reaction without recurrence.        Follow-up Information   Follow up with [facility] On 10/18/2013. (Financial Aid Eligibility Appointment  10/18/2013 at 10:00)    Contact information:   [place] [address] [phone]      Follow up with [facility]    . (PCP, Dr. Verl Blalock on 10/18/13 at 9:00 am)    Contact information:   [place] [address] [phone]       The results of significant diagnostics from this hospitalization (including imaging, microbiology, ancillary and laboratory) are listed below for reference.    Significant Diagnostic Studies: Ct Angio Chest Pe W/cm &/or Wo Cm  10/01/2013   CLINICAL DATA:  Sharp chest pain and shortness of breath.  EXAM: CT ANGIOGRAPHY CHEST WITH CONTRAST  TECHNIQUE: Multidetector CT imaging of the chest was performed using the standard protocol during bolus administration of intravenous contrast. Multiplanar CT image reconstructions and MIPs were obtained to evaluate the vascular anatomy.  CONTRAST:  119mL OMNIPAQUE IOHEXOL 350 MG/ML SOLN  COMPARISON:  Chest x-ray dated 10/01/2013  FINDINGS: There are no pulmonary emboli, infiltrates, or effusions. Heart size is normal. There are multiple calcified lymph nodes in the hilar regions and in the mediastinum including a 2.3 cm calcified lymph node adjacent to the distal esophagus. There is small hiatal hernia.  The lungs are clear.  No osseous abnormalities.  Review of the MIP images  confirms the above findings.  IMPRESSION: No pulmonary emboli or other acute abnormalities. Small hiatal hernia.   Electronically Signed   By: Rozetta Nunnery M.D.   On: 10/01/2013 17:21   Dg Chest Port 1 View  10/01/2013   CLINICAL DATA:  Chest pain, shortness of Breath  EXAM: PORTABLE CHEST - 1 VIEW  COMPARISON:  08/27/2010  FINDINGS: Cardiomediastinal silhouette is stable. Central mild vascular congestion without pulmonary edema. No segmental infiltrate. Mild left basilar atelectasis.  IMPRESSION: Central mild vascular congestion without pulmonary edema. Mild left basilar atelectasis.   Electronically Signed   By: Lahoma Crocker M.D.   On: 10/01/2013 16:05    Microbiology: Recent Results (from the past 240 hour(s))  MRSA PCR SCREENING     Status: None   Collection Time    10/01/13  7:15 PM  Result Value Ref Range Status   MRSA by PCR NEGATIVE  NEGATIVE Final   Comment:            The GeneXpert MRSA Assay (FDA     approved for NASAL specimens     only), is one component of a     comprehensive MRSA colonization     surveillance program. It is not     intended to diagnose MRSA     infection nor to guide or     monitor treatment for     MRSA infections.     Labs: Basic Metabolic Panel:  Recent Labs Lab 10/01/13 1522 10/02/13 0334 10/03/13 0552 10/04/13 0543  NA 140 139 139 141  K 4.3 4.2 3.4* 3.6*  CL 102 105 104 107  CO2 22 16* 19 20  GLUCOSE 102* 113* 110* 105*  BUN 10 10 7 8   CREATININE 0.71 0.56 0.59 0.67  CALCIUM 10.0 8.6 8.2* 8.8   Liver Function Tests: No results found for this basename: AST, ALT, ALKPHOS, BILITOT, PROT, ALBUMIN,  in the last 168 hours No results found for this basename: LIPASE, AMYLASE,  in the last 168 hours No results found for this basename: AMMONIA,  in the last 168 hours CBC:  Recent Labs Lab 10/01/13 1522 10/02/13 0334 10/03/13 0552 10/04/13 0543  WBC 14.3* 11.2* 10.9* 8.6  HGB 12.3 10.1* 10.1* 10.0*  HCT 38.1 31.3* 30.8* 30.7*  MCV  82.6 83.7 82.8 83.2  PLT 297 PLATELET CLUMPS NOTED ON SMEAR, COUNT APPEARS ADEQUATE 211 216   Cardiac Enzymes:  Recent Labs Lab 10/01/13 2102 10/02/13 0334 10/02/13 0821  TROPONINI 3.66* 7.59* 9.39*   BNP: BNP (last 3 results)  Recent Labs  10/01/13 1522  PROBNP 98.9   CBG: No results found for this basename: GLUCAP,  in the last 168 hours     Signed:  Geradine Girt  [facility] 10/04/2013, 11:31 AM

## 2013-10-04 NOTE — Discharge Instructions (Signed)
DASH Diet The DASH diet stands for "Dietary Approaches to Stop Hypertension." It is a healthy eating plan that has been shown to reduce high blood pressure (hypertension) in as little as 14 days, while also possibly providing other significant health benefits. These other health benefits include reducing the risk of breast cancer after menopause and reducing the risk of type 2 diabetes, heart disease, colon cancer, and stroke. Health benefits also include weight loss and slowing kidney failure in patients with chronic kidney disease.  DIET GUIDELINES  Limit salt (sodium). Your diet should contain less than 1500 mg of sodium daily.  Limit refined or processed carbohydrates. Your diet should include mostly whole grains. Desserts and added sugars should be used sparingly.  Include small amounts of heart-healthy fats. These types of fats include nuts, oils, and tub margarine. Limit saturated and trans fats. These fats have been shown to be harmful in the body. CHOOSING FOODS  The following food groups are based on a 2000 calorie diet. See your Registered Dietitian for individual calorie needs. Grains and Grain Products (6 to 8 servings daily)  Eat More Often: Whole-wheat bread, brown rice, whole-grain or wheat pasta, quinoa, popcorn without added fat or salt (air popped).  Eat Less Often: White bread, white pasta, white rice, cornbread. Vegetables (4 to 5 servings daily)  Eat More Often: Fresh, frozen, and canned vegetables. Vegetables may be raw, steamed, roasted, or grilled with a minimal amount of fat.  Eat Less Often/Avoid: Creamed or fried vegetables. Vegetables in a cheese sauce. Fruit (4 to 5 servings daily)  Eat More Often: All fresh, canned (in natural juice), or frozen fruits. Dried fruits without added sugar. One hundred percent fruit juice ( cup [237 mL] daily).  Eat Less Often: Dried fruits with added sugar. Canned fruit in light or heavy syrup. [employer], Fish, and Poultry (2  servings or less daily. One serving is 3 to 4 oz [85-114 g]).  Eat More Often: Ninety percent or leaner ground beef, tenderloin, sirloin. Round cuts of beef, chicken breast, [place] breast. All fish. Grill, bake, or broil your meat. Nothing should be fried.  Eat Less Often/Avoid: Fatty cuts of meat, [place], or chicken leg, thigh, or wing. Fried cuts of meat or fish. Dairy (2 to 3 servings)  Eat More Often: Low-fat or fat-free milk, low-fat plain or light yogurt, reduced-fat or part-skim cheese.  Eat Less Often/Avoid: Milk (whole, 2%).Whole milk yogurt. Full-fat cheeses. Nuts, Seeds, and Legumes (4 to 5 servings per week)  Eat More Often: All without added salt.  Eat Less Often/Avoid: Salted nuts and seeds, canned beans with added salt. Fats and Sweets (limited)  Eat More Often: Vegetable oils, tub margarines without trans fats, sugar-free gelatin. Mayonnaise and salad dressings.  Eat Less Often/Avoid: Coconut oils, palm oils, butter, stick margarine, cream, half and half, cookies, candy, pie. FOR MORE INFORMATION The Dash Diet Eating Plan: www.dashdiet.org Document Released: 06/04/2011 Document Revised: 09/07/2011 Document Reviewed: 06/04/2011 [facility] Patient Information 2014 [place], [place].  Fat and Cholesterol Control Diet Your diet has an affect on your fat and cholesterol levels in your blood and organs. Too much fat and cholesterol in your blood can affect your:  Heart.  Blood vessels (arteries, veins).  Gallbladder.  Liver.  Pancreas. CONTROL FAT AND CHOLESTEROL WITH DIET Certain foods raise cholesterol and others lower it. It is important to replace bad fats with other types of fat.  Do not eat:  Fatty meats, such as hot dogs and salami.  Stick margarine  and some tub margarines that have "partially hydrogenated oils" in them.  Baked goods, such as cookies and crackers that have "partially hydrogenated oils" in them.  Saturated tropical oils, such as coconut and  palm oil. Eat the following foods:  Round or loin cuts of red meat.  Chicken (without skin).  Fish.  Veal.  Ground [place] breast.  Shellfish.  Fruit, such as apples.  Vegetables, such as broccoli, potatoes, and carrots.  Beans, peas, and lentils (legumes).  Grains, such as barley, rice, couscous, and bulgar wheat.  Pasta (without cream sauces). Look for foods that are nonfat, low in fat, and low in cholesterol.  FIND FOODS THAT ARE LOWER IN FAT AND CHOLESTEROL  Find foods with soluble fiber and plant sterols (phytosterol). You should eat 2 grams a day of these foods. These foods include:  Fruits.  Vegetables.  Whole grains.  Dried beans and peas.  Nuts and seeds.  Read package labels. Look for low-saturated fats, trans fat free, low-fat foods.  Choose cheese that have only 2 to 3 grams of saturated fat per ounce.  Use heart-healthy tub margarine that is free of trans fat or partially hydrogenated oil.  Avoid buying baked goods that have partially hydrogenated oils in them. Instead, buy baked goods made with whole grains (whole-wheat or whole oat flour). Avoid baked goods labeled with "flour" or "enriched flour."  Buy non-creamy canned soups with reduced salt and no added fats. PREPARING YOUR FOOD  Broil, bake, steam, or roast foods. Do not fry food.  Use non-stick cooking sprays.  Use lemon or herbs to flavor food instead of using butter or stick margarine.  Use nonfat yogurt, salsa, or low-fat dressings for salads. LOW-SATURATED FAT / LOW-FAT FOOD SUBSTITUTES  Meats / Saturated Fat (g)  Avoid: Steak, marbled (3 oz/85 g) / 11 g.  Choose: Steak, lean (3 oz/85 g) / 4 g.  Avoid: Hamburger (3 oz/85 g) / 7 g.  Choose: Hamburger, lean (3 oz/85 g) / 5 g.  Avoid: Ham (3 oz/85 g) / 6 g.  Choose: Ham, lean cut (3 oz/85 g) / 2.4 g.  Avoid: Chicken, with skin, dark meat (3 oz/85 g) / 4 g.  Choose: Chicken, skin removed, dark meat (3 oz/85 g) / 2  g.  Avoid: Chicken, with skin, light meat (3 oz/85 g) / 2.5 g.  Choose: Chicken, skin removed, light meat (3 oz/85 g) / 1 g. Dairy / Saturated Fat (g)  Avoid: Whole milk (1 cup) / 5 g.  Choose: Low-fat milk, 2% (1 cup) / 3 g.  Choose: Low-fat milk, 1% (1 cup) / 1.5 g.  Choose: Skim milk (1 cup) / 0.3 g.  Avoid: Hard cheese (1 oz/28 g) / 6 g.  Choose: Skim milk cheese (1 oz/28 g) / 2 to 3 g.  Avoid: Cottage cheese, 4% fat (1 cup) / 6.5 g.  Choose: Low-fat cottage cheese, 1% fat (1 cup) / 1.5 g.  Avoid: Ice cream (1 cup) / 9 g.  Choose: Sherbet (1 cup) / 2.5 g.  Choose: Nonfat frozen yogurt (1 cup) / 0.3 g.  Choose: Frozen fruit bar / trace.  Avoid: Whipped cream (1 tbs) / 3.5 g.  Choose: Nondairy whipped topping (1 tbs) / 1 g. Condiments / Saturated Fat (g)  Avoid: Mayonnaise (1 tbs) / 2 g.  Choose: Low-fat mayonnaise (1 tbs) / 1 g.  Avoid: Butter (1 tbs) / 7 g.  Choose: Extra light margarine (1 tbs) / 1 g.  Avoid:  Coconut oil (1 tbs) / 11.8 g.  Choose: Olive oil (1 tbs) / 1.8 g.  Choose: Corn oil (1 tbs) / 1.7 g.  Choose: Safflower oil (1 tbs) / 1.2 g.  Choose: Sunflower oil (1 tbs) / 1.4 g.  Choose: Soybean oil (1 tbs) / 2.4 g .  Choose: Canola oil (1 tbs) / 1 g. Document Released: 12/15/2011 Document Revised: 02/15/2013 Document Reviewed: 12/15/2011 [facility] Patient Information 2014 [place].  Hypertension As your heart beats, it forces blood through your arteries. This force is your blood pressure. If the pressure is too high, it is called hypertension (HTN) or high blood pressure. HTN is dangerous because you may have it and not know it. High blood pressure may mean that your heart has to work harder to pump blood. Your arteries may be narrow or stiff. The extra work puts you at risk for heart disease, stroke, and other problems.  Blood pressure consists of two numbers, a higher number over a lower, 110/72, for example. It is stated as "110  over 72." The ideal is below 120 for the top number (systolic) and under 80 for the bottom (diastolic). Write down your blood pressure today. You should pay close attention to your blood pressure if you have certain conditions such as:  Heart failure.  Prior heart attack.  Diabetes  Chronic kidney disease.  Prior stroke.  Multiple risk factors for heart disease. To see if you have HTN, your blood pressure should be measured while you are seated with your arm held at the level of the heart. It should be measured at least twice. A one-time elevated blood pressure reading (especially in the Emergency Department) does not mean that you need treatment. There may be conditions in which the blood pressure is different between your right and left arms. It is important to see your caregiver soon for a recheck. Most people have essential hypertension which means that there is not a specific cause. This type of high blood pressure may be lowered by changing lifestyle factors such as:  Stress.  Smoking.  Lack of exercise.  Excessive weight.  Drug/tobacco/alcohol use.  Eating less salt. Most people do not have symptoms from high blood pressure until it has caused damage to the body. Effective treatment can often prevent, delay or reduce that damage. TREATMENT  When a cause has been identified, treatment for high blood pressure is directed at the cause. There are a large number of medications to treat HTN. These fall into several categories, and your caregiver will help you select the medicines that are best for you. Medications may have side effects. You should review side effects with your caregiver. If your blood pressure stays high after you have made lifestyle changes or started on medicines,   Your medication(s) may need to be changed.  Other problems may need to be addressed.  Be certain you understand your prescriptions, and know how and when to take your medicine.  Be sure to follow  up with your caregiver within the time frame advised (usually within two weeks) to have your blood pressure rechecked and to review your medications.  If you are taking more than one medicine to lower your blood pressure, make sure you know how and at what times they should be taken. Taking two medicines at the same time can result in blood pressure that is too low. SEEK IMMEDIATE MEDICAL CARE IF:  You develop a severe headache, blurred or changing vision, or confusion.  You have  unusual weakness or numbness, or a faint feeling.  You have severe chest or abdominal pain, vomiting, or breathing problems. MAKE SURE YOU:   Understand these instructions.  Will watch your condition.  Will get help right away if you are not doing well or get worse. Document Released: 06/15/2005 Document Revised: 09/07/2011 Document Reviewed: 02/03/2008 [facility] Patient Information 2014 [place].

## 2013-10-04 NOTE — Progress Notes (Signed)
Patient Name: Christine Barrett Date of Encounter: 10/04/2013  Principal Problem:   Accelerated hypertension with heart disease and without congestive heart failure Active Problems:   HYPERLIPIDEMIA   HYPERTENSION   Hypertensive urgency   Chest pain   Hyperlipemia   Anemia   NSTEMI (non-ST elevated myocardial infarction)   Hypertensive emergency   CAD S/P percutaneous coronary angioplasty - Ostial AVG Cx - Scoring balloon PTCA    Patient Profile: 53 yo female w/ HTN, HL, not on meds PTA was admitted 04/05 w/ chest pain, NSTEMI and hypertensive urgency/emergency.  SUBJECTIVE: No chest pain, no SOB. Wants information on healthy eating.   OBJECTIVE Filed Vitals:   10/03/13 2030 10/03/13 2344 10/04/13 0009 10/04/13 0524  BP: 179/86 145/75  154/85  Pulse: 94 88  85  Temp: 98.8 F (37.1 C) 98.7 F (37.1 C)  98.8 F (37.1 C)  TempSrc: Oral Oral  Oral  Resp: 16 18  20   Height:      Weight:   252 lb 13.9 oz (114.7 kg)   SpO2: 98% 100%  97%    Intake/Output Summary (Last 24 hours) at 10/04/13 0710 Last data filed at 10/03/13 1916  Gross per 24 hour  Intake 1589.75 ml  Output   1600 ml  Net -10.25 ml   Filed Weights   10/02/13 2008 10/02/13 2357 10/04/13 0009  Weight: 251 lb 5.2 oz (114 kg) 251 lb 5.2 oz (114 kg) 252 lb 13.9 oz (114.7 kg)    PHYSICAL EXAM General: Well developed, well nourished, female in no acute distress. Head: Normocephalic, atraumatic.  Neck: Supple without bruits, JVD not elevated. Lungs:  Resp regular and unlabored, CTA. Heart: RRR, S1, S2, no S3, S4, or murmur; no rub. Abdomen: Soft, non-tender, non-distended, BS + x 4.  Extremities: No clubbing, cyanosis, no edema. R and L radial sites without ecchymosis, hematoma Neuro: Alert and oriented X 3. Moves all extremities spontaneously. Psych: Normal affect.  LABS: CBC: Recent Labs  10/03/13 0552 10/04/13 0543  WBC 10.9* 8.6  HGB 10.1* 10.0*  HCT 30.8* 30.7*  MCV 82.8 83.2  PLT 211 216     INR: Recent Labs  10/03/13 0552  INR 9.60   Basic Metabolic Panel: Recent Labs  10/03/13 0552 10/04/13 0543  NA 139 141  K 3.4* 3.6*  CL 104 107  CO2 19 20  GLUCOSE 110* 105*  BUN 7 8  CREATININE 0.59 0.67  CALCIUM 8.2* 8.8   Cardiac Enzymes: Recent Labs  10/01/13 2102 10/02/13 0334 10/02/13 0821  TROPONINI 3.66* 7.59* 9.39*    Recent Labs  10/01/13 1529  TROPIPOC 0.00   BNP: Pro B Natriuretic peptide (BNP)  Date/Time Value Ref Range Status  10/01/2013  3:22 PM 98.9  0 - 125 pg/mL Final  08/27/2010  2:30 PM <30.0  0.0 - 100.0 pg/mL Final   Hemoglobin A1C: Recent Labs  10/01/13 2102  HGBA1C 5.9*   Fasting Lipid Panel: Recent Labs  10/01/13 2104  CHOL 251*  HDL 44  LDLCALC 185*  TRIG 110  CHOLHDL 5.7   Thyroid Function Tests: Recent Labs  10/01/13 2104  TSH 2.620   TELE:   SR     Current Medications:  . amLODipine  5 mg Oral Daily  . aspirin EC  81 mg Oral Daily  . atorvastatin  40 mg Oral q1800  . carvedilol  12.5 mg Oral BID  . fluticasone  1 spray Each Nare BID  . lisinopril  10 mg  Oral QHS  . pantoprazole  40 mg Oral Daily  . sodium chloride  3 mL Intravenous Q12H  . sodium chloride  3 mL Intravenous Q12H  . Ticagrelor  90 mg Oral BID   . sodium chloride 150 mL/hr at 10/02/13 1928  . sodium chloride 75 mL/hr (10/03/13 0830)  . nitroGLYCERIN 30 mcg/min (10/03/13 1215)  . nitroGLYCERIN      ASSESSMENT AND PLAN:   NSTEMI (non-ST elevated myocardial infarction) - s/p PCI CFX, PTCA w/ Angiosculpt cutting balloon, on ASA, Brilinta, BB, ACE, statin.  Principal Problem:   Accelerated hypertension with heart disease and without congestive heart failure - per IM, will discuss increasing BB w/ MD  Otherwise, per IM. OK w/ cards to d/c today. Active Problems:   HYPERLIPIDEMIA   HYPERTENSION   Hypertensive urgency   Chest pain   Hyperlipemia   Anemia  Signed, Lonn Georgia , PA-C 7:10 AM 10/04/2013 Patient seen and examined  and history reviewed. Agree with above findings and plan. Doing well post PCI. Plan DAPT for one year due to NSTEMI. Will increase carvedilol to 25 mg bid for BP. Stable for DC today from a cardiac standpoint. Follow up in 2 weeks with Dr. Ellyn Hack.   Ander Slade [facility] 10/04/2013 8:47 AM

## 2013-10-04 NOTE — Progress Notes (Signed)
CARDIAC REHAB PHASE I   PRE:  Rate/Rhythm: 88 SR  BP:  Supine: 169/91  Sitting:   Standing:    SaO2:   MODE:  Ambulation: 1000 ft   POST:  Rate/Rhythm: 116 ST  BP:  Supine  Sitting: 186/96  Standing:    SaO2:  0750-0900 Pt walked 1000 ft independently with steady gait. Slightly SOB but no CP. BP elevated before and after walk. MI education completed with pt who was very attentive. Gave heart healthy and low sodium diets. Discussed CRP 2 and pt gave permission for referral. Gave financial sheet to fill out and encouraged her to make appt to see counselor. Discussed importance of keeping BP under control.    Graylon Good, RN BSN  10/04/2013 8:56 AM

## 2013-10-12 ENCOUNTER — Encounter: Payer: Self-pay | Admitting: Cardiovascular Disease

## 2013-10-12 ENCOUNTER — Telehealth: Payer: Self-pay | Admitting: Cardiology

## 2013-10-12 MED ORDER — TICAGRELOR 90 MG PO TABS: 90.0000 mg | ORAL_TABLET | Freq: Two times a day (BID) | ORAL | Status: DC

## 2013-10-12 NOTE — Telephone Encounter (Signed)
Spoke with patient. Needs Rx from Dr. Allison Quarry office faxed to [facility] in order to get assistance with Brilinta. Will order medicine and fax to [employer] [facility] at [phone]

## 2013-10-12 NOTE — Telephone Encounter (Signed)
Signed Rx for Brilinta #[id number] with 3 refills faxed to assistance program and number provided.

## 2013-10-12 NOTE — Telephone Encounter (Signed)
Please call-concerning a prescription she received when she was in the hospital last week.She will give you the details what she needs.

## 2013-10-18 ENCOUNTER — Encounter: Payer: Self-pay | Admitting: Cardiology

## 2013-10-18 ENCOUNTER — Ambulatory Visit: Payer: Self-pay | Attending: Cardiology | Admitting: Cardiology

## 2013-10-18 ENCOUNTER — Ambulatory Visit: Payer: Self-pay | Attending: Cardiology

## 2013-10-18 VITALS — BP 152/98 | HR 83 | Temp 98.4°F | Resp 18 | Ht 70.0 in | Wt 246.0 lb

## 2013-10-18 DIAGNOSIS — Z9861 Coronary angioplasty status: Secondary | ICD-10-CM

## 2013-10-18 DIAGNOSIS — J42 Unspecified chronic bronchitis: Secondary | ICD-10-CM | POA: Insufficient documentation

## 2013-10-18 DIAGNOSIS — I10 Essential (primary) hypertension: Secondary | ICD-10-CM

## 2013-10-18 DIAGNOSIS — Z79899 Other long term (current) drug therapy: Secondary | ICD-10-CM | POA: Insufficient documentation

## 2013-10-18 DIAGNOSIS — Z7982 Long term (current) use of aspirin: Secondary | ICD-10-CM | POA: Insufficient documentation

## 2013-10-18 DIAGNOSIS — E785 Hyperlipidemia, unspecified: Secondary | ICD-10-CM | POA: Insufficient documentation

## 2013-10-18 DIAGNOSIS — J309 Allergic rhinitis, unspecified: Secondary | ICD-10-CM

## 2013-10-18 DIAGNOSIS — I2510 Atherosclerotic heart disease of native coronary artery without angina pectoris: Secondary | ICD-10-CM

## 2013-10-18 DIAGNOSIS — I214 Non-ST elevation (NSTEMI) myocardial infarction: Secondary | ICD-10-CM | POA: Insufficient documentation

## 2013-10-18 MED ORDER — LOSARTAN POTASSIUM 100 MG PO TABS: 100.0000 mg | ORAL_TABLET | Freq: Every day | ORAL | Status: DC

## 2013-10-18 MED ORDER — FLUTICASONE PROPIONATE 50 MCG/ACT NA SUSP: 1.0000 | Freq: Two times a day (BID) | NASAL | Status: DC

## 2013-10-18 NOTE — Addendum Note (Signed)
Addended by: Candie Chroman D on: 10/18/2013 09:52 AM   Modules accepted: Orders

## 2013-10-18 NOTE — Assessment & Plan Note (Signed)
Not under control. With her cough we'll discontinue lisinopril and placed on losartan 100 mg per morning. Establish primary care. Goals set with patient and all questions answered.

## 2013-10-18 NOTE — Assessment & Plan Note (Signed)
We'll give her a generic steroid nasal spray with 3 refills. Establish primary care. Cough suppressant suggested over-the-counter.

## 2013-10-18 NOTE — Progress Notes (Signed)
HPI Christine Barrett comes in today to establish with me as her cardiologist. She recently had a non-STEMI. She had balloon angioplasty of a tight AV circumflex stenosis. She has residual nonobstructive disease particularly in the LAD. She has normal left ventricular function with no Iyanna Drummer motion abnormalities by echocardiography.  Prior to admission showed a history of hypertension and hyperlipidemia but had been off her medication for 2 years because of losing her job and insurance. She does not smoke. Her cholesterol was 251 in the hospital. She is overweight and blood sugars were borderline. She is not diabetic yet.  Other than problems with her chronic bronchitis and sinusitis she is doing well. She's having no ischemic symptoms. She is anxious to go back to work. 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 She also wants to go back to walking and going to the gym. She is here with her husband.  She is on lisinopril and knows this is associated with increased cough. Her blood pressure is not under control today.  Her cough is not associated with any hemoptysis or purulent sputum. She denies any fever. She's used steroid sprays in the past with success.  Past Medical History  Diagnosis Date  . Hypertension   . Hyperlipemia   . Seasonal allergies   . Sinusitis   . CAD S/P percutaneous coronary angioplasty     Ostial AV G Cx - 2.5 mm Angiosculpt; mid LAD 40-60%  . Non-ST elevation (NSTEMI) myocardial infarction 10/01/2013    Due to Accelerated HTN with existing CAD  . Chronic bronchitis     "frequently; not q yr" (10/03/2013)  . Migraine     "@ least once/month" (10/03/2013)    Current Outpatient Prescriptions  Medication Sig Dispense Refill  . aspirin EC 81 MG EC tablet Take 1 tablet (81 mg total) by mouth daily.      . carvedilol (COREG) 25 MG tablet Take 1 tablet (25 mg total) by mouth 2 (two) times daily.  60 tablet  0  . fluticasone (FLONASE) 50 MCG/ACT nasal spray Place 1 spray into  both nostrils 2 (two) times daily.      [name] losartan (COZAAR) 100 MG tablet Take 1 tablet (100 mg total) by mouth daily.  90 tablet  3  . lovastatin (MEVACOR) 20 MG tablet Take 1 tablet (20 mg total) by mouth at bedtime.  30 tablet  0  . Ticagrelor (BRILINTA) 90 MG TABS tablet Take 1 tablet (90 mg total) by mouth 2 (two) times daily.  180 tablet  3   No current facility-administered medications for this visit.    Allergies  Allergen Reactions  . Penicillins Rash    Pt states she has taken since intial  reaction without recurrence.     No family history on file.  History   Social History  . Marital Status: Married    Spouse Name: N/A    Number of Children: N/A  . Years of Education: N/A   Occupational History  . Not on file.   Social History Main Topics  . Smoking status: Never Smoker   . Smokeless tobacco: Never Used  . Alcohol Use: No  . Drug Use: No  . Sexual Activity: Yes   Other Topics Concern  . Not on file   Social History Narrative  . No narrative on file    ROS ALL NEGATIVE EXCEPT THOSE NOTED IN HPI  PE  General Appearance: well developed, well nourished in no acute distress, overweight HEENT:  symmetrical face, PERRLA, good dentition  Neck: no JVD, thyromegaly, or adenopathy, trachea midline Chest: symmetric without deformity Cardiac: PMI non-displaced, RRR, normal S1, S2, no gallop or murmur Lung: clear to ausculation and percussion Vascular: all pulses full without bruits  Abdominal: nondistended, nontender, good bowel sounds, no HSM, no bruits Extremities: no cyanosis, clubbing or edema, no sign of DVT, no varicosities  Skin: normal color, no rashes Neuro: alert and oriented x 3, non-focal Pysch: normal affect  EKG Last EKG she reviewed showing minor ST segment elevation inferiorly BMET    Component Value Date/Time   NA 141 10/04/2013 0543   K 3.6* 10/04/2013 0543   CL 107 10/04/2013 0543   CO2 20 10/04/2013 0543   GLUCOSE 105* 10/04/2013 0543   BUN  8 10/04/2013 0543   CREATININE 0.67 10/04/2013 0543   CALCIUM 8.8 10/04/2013 0543   GFRNONAA >90 10/04/2013 0543   GFRAA >90 10/04/2013 0543    Lipid Panel     Component Value Date/Time   CHOL 251* 10/01/2013 2104   TRIG 110 10/01/2013 2104   HDL 44 10/01/2013 2104   CHOLHDL 5.7 10/01/2013 2104   VLDL 22 10/01/2013 2104   LDLCALC 185* 10/01/2013 2104    CBC    Component Value Date/Time   WBC 8.6 10/04/2013 0543   RBC 3.69* 10/04/2013 0543   HGB 10.0* 10/04/2013 0543   HCT 30.7* 10/04/2013 0543   PLT 216 10/04/2013 0543   MCV 83.2 10/04/2013 0543   MCH 27.1 10/04/2013 0543   MCHC 32.6 10/04/2013 0543   RDW 13.9 10/04/2013 0543   LYMPHSABS 2.3 08/27/2010 1407   MONOABS 0.9 08/27/2010 1407   EOSABS 0.4 08/27/2010 1407   BASOSABS 0.1 08/27/2010 1407

## 2013-10-18 NOTE — Assessment & Plan Note (Signed)
Stable. We'll continue dual antiplatelet therapy for 3 months. I'll see her then. I have reinforced secondary preventative strategies including taking her meds with good blood pressure control, LDL goal less than 100 and regular exercise and losing weight. She is aware that she is borderline diabetic.

## 2013-10-19 ENCOUNTER — Telehealth: Payer: Self-pay | Admitting: Cardiology

## 2013-10-19 NOTE — Telephone Encounter (Signed)
Orders faxed to Cardiac Rehab are illegible please refax f: [phone].

## 2013-10-30 ENCOUNTER — Telehealth: Payer: Self-pay | Admitting: *Deleted

## 2013-10-30 ENCOUNTER — Other Ambulatory Visit: Payer: Self-pay | Admitting: *Deleted

## 2013-10-30 DIAGNOSIS — E785 Hyperlipidemia, unspecified: Secondary | ICD-10-CM

## 2013-10-30 DIAGNOSIS — I10 Essential (primary) hypertension: Secondary | ICD-10-CM

## 2013-10-30 MED ORDER — LOVASTATIN 20 MG PO TABS: 20.0000 mg | ORAL_TABLET | Freq: Every day | ORAL | Status: DC

## 2013-10-30 MED ORDER — CARVEDILOL 25 MG PO TABS: 25.0000 mg | ORAL_TABLET | Freq: Two times a day (BID) | ORAL | Status: DC

## 2013-10-30 NOTE — Telephone Encounter (Signed)
Patient called wanting a refill on Coreg and Lovastatin. Patient was seen on the 22Apr2015 by Dr. Verl Blalock and has a follow up appointment with Dr. Doreene Burke [phone]. Informed patient we will refill just once until her appointment. Patient verbalized understanding. Alverda Skeans, RN

## 2013-11-30 ENCOUNTER — Encounter: Payer: Self-pay | Admitting: Internal Medicine

## 2013-11-30 ENCOUNTER — Ambulatory Visit: Payer: Self-pay | Attending: Internal Medicine | Admitting: Internal Medicine

## 2013-11-30 VITALS — BP 187/104 | HR 86 | Temp 98.4°F | Resp 18 | Ht 70.0 in | Wt 251.0 lb

## 2013-11-30 DIAGNOSIS — J42 Unspecified chronic bronchitis: Secondary | ICD-10-CM | POA: Insufficient documentation

## 2013-11-30 DIAGNOSIS — Z1231 Encounter for screening mammogram for malignant neoplasm of breast: Secondary | ICD-10-CM

## 2013-11-30 DIAGNOSIS — Z7982 Long term (current) use of aspirin: Secondary | ICD-10-CM | POA: Insufficient documentation

## 2013-11-30 DIAGNOSIS — I252 Old myocardial infarction: Secondary | ICD-10-CM | POA: Insufficient documentation

## 2013-11-30 DIAGNOSIS — Z9861 Coronary angioplasty status: Secondary | ICD-10-CM | POA: Insufficient documentation

## 2013-11-30 DIAGNOSIS — I10 Essential (primary) hypertension: Secondary | ICD-10-CM | POA: Insufficient documentation

## 2013-11-30 DIAGNOSIS — E785 Hyperlipidemia, unspecified: Secondary | ICD-10-CM | POA: Insufficient documentation

## 2013-11-30 DIAGNOSIS — I2510 Atherosclerotic heart disease of native coronary artery without angina pectoris: Secondary | ICD-10-CM | POA: Insufficient documentation

## 2013-11-30 LAB — COMPLETE METABOLIC PANEL WITH GFR
ALBUMIN: 4.1 g/dL (ref 3.5–5.2)
ALT: 12 U/L (ref 0–35)
AST: 12 U/L (ref 0–37)
Alkaline Phosphatase: 65 U/L (ref 39–117)
BUN: 12 mg/dL (ref 6–23)
CALCIUM: 9.7 mg/dL (ref 8.4–10.5)
CHLORIDE: 107 meq/L (ref 96–112)
CO2: 23 meq/L (ref 19–32)
CREATININE: 0.69 mg/dL (ref 0.50–1.10)
GFR, Est African American: >89 mL/min
GFR, Est Non African American: >89 mL/min
Glucose, Bld: 115 mg/dL — ABNORMAL HIGH (ref 70–99)
POTASSIUM: 3.6 meq/L (ref 3.5–5.3)
Sodium: 138 mEq/L (ref 135–145)
Total Bilirubin: 0.9 mg/dL (ref 0.2–1.2)
Total Protein: 7.1 g/dL (ref 6.0–8.3)

## 2013-11-30 MED ORDER — CARVEDILOL 25 MG PO TABS: 25.0000 mg | ORAL_TABLET | Freq: Two times a day (BID) | ORAL | Status: DC

## 2013-11-30 MED ORDER — ISOSORBIDE MONONITRATE ER 60 MG PO TB24: 60.0000 mg | ORAL_TABLET | Freq: Every day | ORAL | Status: DC

## 2013-11-30 MED ORDER — LOVASTATIN 40 MG PO TABS: 40.0000 mg | ORAL_TABLET | Freq: Every day | ORAL | Status: DC

## 2013-11-30 MED ORDER — CLONIDINE HCL 0.1 MG PO TABS
0.2000 mg | ORAL_TABLET | Freq: Once | ORAL | Status: AC
  Administered 2013-11-30: 0.2 mg via ORAL

## 2013-11-30 NOTE — Progress Notes (Signed)
Pt here to establish medical care for hx htn,cad s/p coronary angioplasty and migraine headache Pt is taking medication daily Need refills on Coreg,Lovastatin Blood pressure elevated 187/104 86 Pt states she she is very stressed due to sister Stage 4 Breast Ca C/o left eye spots x 2 weeks with pressure Denies blurry vision and dizziness

## 2013-11-30 NOTE — Progress Notes (Signed)
Patient ID: Christine Barrett, female   DOB: [birth year], 53 y.o.   MRN: [id number]   Christine Barrett, is a 53 y.o. female  VEL:[id number]  CHE:[id number]  DOB - 1961/05/12  CC:  Chief Complaint  Patient presents with  . Establish Care  . Hypertension  . Coronary Artery Disease       HPI: Christine Barrett is a 53 y.o. female here today to establish medical care. Patient has a medical history of hypertension, hyperlipidemia, has not been taking medication for about 2 years because of lack of insurance and is not able to afford her medications. She presented to the ED recently in April 2015 with substernal chest pain that is exertional. She was evaluated and found to have non-STEMI. She had balloon angioplasty of a tight AV circumflex stenosis. She has residual nonobstructive disease particularly in the LAD. She has normal left ventricular function with no wall motion abnormalities by echocardiography. Pt here to establish medical care, Need refills on Coreg, Lovastatin. Pt states she she is very stressed due to sister recently been diagnosed with Stage 4 Breast Ca. C/o left eye spots x 2 weeks with pressure. Denies blurry vision and dizziness Patient has No headache, No chest pain, No abdominal pain - No Nausea, No new weakness tingling or numbness, No Cough - SOB.  Allergies  Allergen Reactions  . Penicillins Rash    Pt states she has taken since intial  reaction without recurrence.    Past Medical History  Diagnosis Date  . Hypertension   . Hyperlipemia   . Seasonal allergies   . Sinusitis   . CAD S/P percutaneous coronary angioplasty     Ostial AV G Cx - 2.5 mm Angiosculpt; mid LAD 40-60%  . Non-ST elevation (NSTEMI) myocardial infarction 10/01/2013    Due to Accelerated HTN with existing CAD  . Chronic bronchitis     "frequently; not q yr" (10/03/2013)  . Migraine     "@ least once/month" (10/03/2013)   Current Outpatient Prescriptions on File Prior to Visit  Medication Sig Dispense Refill    . aspirin EC 81 MG EC tablet Take 1 tablet (81 mg total) by mouth daily.      . fluticasone (FLONASE) 50 MCG/ACT nasal spray Place 1 spray into both nostrils 2 (two) times daily.  16 g  2  . losartan (COZAAR) 100 MG tablet Take 1 tablet (100 mg total) by mouth daily.  90 tablet  3  . Ticagrelor (BRILINTA) 90 MG TABS tablet Take 1 tablet (90 mg total) by mouth 2 (two) times daily.  180 tablet  3   No current facility-administered medications on file prior to visit.   History reviewed. No pertinent family history. History   Social History  . Marital Status: Married    Spouse Name: N/A    Number of Children: N/A  . Years of Education: N/A   Occupational History  . Not on file.   Social History Main Topics  . Smoking status: Never Smoker   . Smokeless tobacco: Never Used  . Alcohol Use: No  . Drug Use: No  . Sexual Activity: Yes   Other Topics Concern  . Not on file   Social History Narrative  . No narrative on file    Review of Systems: Constitutional: Negative for fever, chills, diaphoresis, activity change, appetite change and fatigue. HENT: Negative for ear pain, nosebleeds, congestion, facial swelling, rhinorrhea, neck pain, neck stiffness and ear discharge.  Eyes: Negative for pain, discharge, redness, itching  and visual disturbance. Respiratory: Negative for cough, choking, chest tightness, shortness of breath, wheezing and stridor.  Cardiovascular: Negative for chest pain, palpitations and leg swelling. Gastrointestinal: Negative for abdominal distention. Genitourinary: Negative for dysuria, urgency, frequency, hematuria, flank pain, decreased urine volume, difficulty urinating and dyspareunia.  Musculoskeletal: Negative for back pain, joint swelling, arthralgia and gait problem. Neurological: Negative for dizziness, tremors, seizures, syncope, facial asymmetry, speech difficulty, weakness, light-headedness, numbness and headaches.  Hematological: Negative for  adenopathy. Does not bruise/bleed easily. Psychiatric/Behavioral: Negative for hallucinations, behavioral problems, confusion, dysphoric mood, decreased concentration and agitation.    Objective:   Filed Vitals:   11/30/13 1205  BP: 187/104  Pulse: 86  Temp: 98.4 F (36.9 C)  Resp: 18    Physical Exam: Constitutional: Patient appears well-developed and well-nourished. No distress. Mild obesity HENT: Normocephalic, atraumatic, External right and left ear normal. Oropharynx is clear and moist.  Eyes: Conjunctivae and EOM are normal. PERRLA, no scleral icterus. Neck: Normal ROM. Neck supple. No JVD. No tracheal deviation. No thyromegaly. CVS: RRR, S1/S2 +, no murmurs, no gallops, no carotid bruit.  Pulmonary: Effort and breath sounds normal, no stridor, rhonchi, wheezes, rales.  Abdominal: Soft. BS +, no distension, tenderness, rebound or guarding.  Musculoskeletal: Normal range of motion. No edema and no tenderness.  Lymphadenopathy: No lymphadenopathy noted, cervical, inguinal or axillary Neuro: Alert. Normal reflexes, muscle tone coordination. No cranial nerve deficit. Skin: Skin is warm and dry. No rash noted. Not diaphoretic. No erythema. No pallor. Psychiatric: Normal mood and affect. Behavior, judgment, thought content normal.  Lab Results  Component Value Date   WBC 8.6 10/04/2013   HGB 10.0* 10/04/2013   HCT 30.7* 10/04/2013   MCV 83.2 10/04/2013   PLT 216 10/04/2013   Lab Results  Component Value Date   CREATININE 0.67 10/04/2013   BUN 8 10/04/2013   NA 141 10/04/2013   K 3.6* 10/04/2013   CL 107 10/04/2013   CO2 20 10/04/2013    Lab Results  Component Value Date   HGBA1C 5.9* 10/01/2013   Lipid Panel     Component Value Date/Time   CHOL 251* 10/01/2013 2104   TRIG 110 10/01/2013 2104   HDL 44 10/01/2013 2104   CHOLHDL 5.7 10/01/2013 2104   VLDL 22 10/01/2013 2104   LDLCALC 185* 10/01/2013 2104       Assessment and plan:   1. Accelerated hypertension  - cloNIDine (CATAPRES)  tablet 0.2 mg; Take 2 tablets (0.2 mg total) by mouth once. We discussed blood pressure goal of 130/80 mmHg We discussed consequences of uncontrolled hypertension Patient has been counseled extensively about nutrition and exercise DASH diet  2. HTN (hypertension)  - carvedilol (COREG) 25 MG tablet; Take 1 tablet (25 mg total) by mouth 2 (two) times daily.  Dispense: 180 tablet; Refill: 3 - isosorbide mononitrate (IMDUR) 60 MG 24 hr tablet; Take 1 tablet (60 mg total) by mouth daily.  Dispense: 30 tablet; Refill: 6  - COMPLETE METABOLIC PANEL WITH GFR  3. Other and unspecified hyperlipidemia  - lovastatin (MEVACOR) 40 MG tablet; Take 1 tablet (40 mg total) by mouth at bedtime.  Dispense: 90 tablet; Refill: 3  4. CAD S/P percutaneous coronary angioplasty Patient encouraged to follow with cardiologist as scheduled   Return in about 4 weeks (around 12/28/2013), or if symptoms worsen or fail to improve, for One week for BP Check, Nurse Visit, and 4 weeks for annual physical including pap smear, mammogram a.  The patient was given clear  instructions to go to ER or return to medical center if symptoms don't improve, worsen or new problems develop. The patient verbalized understanding. The patient was told to call to get lab results if they haven't heard anything in the next week.     This note has been created with Surveyor, quantity. Any transcriptional errors are unintentional.    Angelica Chessman, MD, MHA, [place], [place], [place]   11/30/2013, 12:26 PM

## 2013-11-30 NOTE — Patient Instructions (Signed)
Exercise to Lose Weight Exercise and a healthy diet may help you lose weight. Your doctor may suggest specific exercises. EXERCISE IDEAS AND TIPS  Choose low-cost things you enjoy doing, such as walking, bicycling, or exercising to workout videos.  Take stairs instead of the elevator.  Walk during your lunch break.  Park your car further away from work or school.  Go to a gym or an exercise class.  Start with 5 to 10 minutes of exercise each day. Build up to 30 minutes of exercise 4 to 6 days a week.  Wear shoes with good support and comfortable clothes.  Stretch before and after working out.  Work out until you breathe harder and your heart beats faster.  Drink extra water when you exercise.  Do not do so much that you hurt yourself, feel dizzy, or get very short of breath. Exercises that burn about 150 calories:  Running 1  miles in 15 minutes.  Playing volleyball for 45 to 60 minutes.  Washing and waxing a car for 45 to 60 minutes.  Playing touch football for 45 minutes.  Walking 1  miles in 35 minutes.  Pushing a stroller 1  miles in 30 minutes.  Playing basketball for 30 minutes.  Raking leaves for 30 minutes.  Bicycling 5 miles in 30 minutes.  Walking 2 miles in 30 minutes.  Dancing for 30 minutes.  Shoveling snow for 15 minutes.  Swimming laps for 20 minutes.  Walking up stairs for 15 minutes.  Bicycling 4 miles in 15 minutes.  Gardening for 30 to 45 minutes.  Jumping rope for 15 minutes.  Washing windows or floors for 45 to 60 minutes. Document Released: 07/18/2010 Document Revised: 09/07/2011 Document Reviewed: 07/18/2010 ExitCare Patient Information 2014 [employer]. Hypertension As your heart beats, it forces blood through your arteries. This force is your blood pressure. If the pressure is too high, it is called hypertension (HTN) or high blood pressure. HTN is dangerous because you may have it and not know it. High blood pressure  may mean that your heart has to work harder to pump blood. Your arteries may be narrow or stiff. The extra work puts you at risk for heart disease, stroke, and other problems.  Blood pressure consists of two numbers, a higher number over a lower, 110/72, for example. It is stated as "110 over 72." The ideal is below 120 for the top number (systolic) and under 80 for the bottom (diastolic). Write down your blood pressure today. You should pay close attention to your blood pressure if you have certain conditions such as:  Heart failure.  Prior heart attack.  Diabetes  Chronic kidney disease.  Prior stroke.  Multiple risk factors for heart disease. To see if you have HTN, your blood pressure should be measured while you are seated with your arm held at the level of the heart. It should be measured at least twice. A one-time elevated blood pressure reading (especially in the Emergency Department) does not mean that you need treatment. There may be conditions in which the blood pressure is different between your right and left arms. It is important to see your caregiver soon for a recheck. Most people have essential hypertension which means that there is not a specific cause. This type of high blood pressure may be lowered by changing lifestyle factors such as:  Stress.  Smoking.  Lack of exercise.  Excessive weight.  Drug/tobacco/alcohol use.  Eating less salt. Most people do not have symptoms   from high blood pressure until it has caused damage to the body. Effective treatment can often prevent, delay or reduce that damage. TREATMENT  When a cause has been identified, treatment for high blood pressure is directed at the cause. There are a large number of medications to treat HTN. These fall into several categories, and your caregiver will help you select the medicines that are best for you. Medications may have side effects. You should review side effects with your caregiver. If your  blood pressure stays high after you have made lifestyle changes or started on medicines,   Your medication(s) may need to be changed.  Other problems may need to be addressed.  Be certain you understand your prescriptions, and know how and when to take your medicine.  Be sure to follow up with your caregiver within the time frame advised (usually within two weeks) to have your blood pressure rechecked and to review your medications.  If you are taking more than one medicine to lower your blood pressure, make sure you know how and at what times they should be taken. Taking two medicines at the same time can result in blood pressure that is too low. SEEK IMMEDIATE MEDICAL CARE IF:  You develop a severe headache, blurred or changing vision, or confusion.  You have unusual weakness or numbness, or a faint feeling.  You have severe chest or abdominal pain, vomiting, or breathing problems. MAKE SURE YOU:   Understand these instructions.  Will watch your condition.  Will get help right away if you are not doing well or get worse. Document Released: 06/15/2005 Document Revised: 09/07/2011 Document Reviewed: 02/03/2008 ExitCare Patient Information 2014 [employer].  

## 2013-12-05 ENCOUNTER — Telehealth: Payer: Self-pay | Admitting: Emergency Medicine

## 2013-12-05 NOTE — Telephone Encounter (Signed)
Left message for pt to call for lab results 

## 2013-12-05 NOTE — Telephone Encounter (Signed)
Message copied by [name] on Tue Dec 05, 2013  5:09 PM ------      Message from: Christine Barrett      Created: Fri Dec 01, 2013  5:18 PM       Please inform patient that her complete metabolic panel including kidney and liver function is within normal limit ------

## 2013-12-07 ENCOUNTER — Ambulatory Visit: Payer: Self-pay | Attending: Internal Medicine

## 2013-12-07 VITALS — BP 179/97 | HR 75 | Resp 16 | Wt 247.0 lb

## 2013-12-07 DIAGNOSIS — I10 Essential (primary) hypertension: Secondary | ICD-10-CM

## 2013-12-07 MED ORDER — CLONIDINE HCL 0.1 MG PO TABS
0.1000 mg | ORAL_TABLET | Freq: Once | ORAL | Status: AC
  Administered 2013-12-07: 0.1 mg via ORAL

## 2013-12-07 NOTE — Patient Instructions (Signed)

## 2013-12-14 ENCOUNTER — Emergency Department (HOSPITAL_COMMUNITY): Payer: Self-pay

## 2013-12-14 ENCOUNTER — Encounter (HOSPITAL_COMMUNITY): Payer: Self-pay | Admitting: Emergency Medicine

## 2013-12-14 ENCOUNTER — Ambulatory Visit: Payer: Self-pay | Attending: Internal Medicine | Admitting: *Deleted

## 2013-12-14 ENCOUNTER — Emergency Department (HOSPITAL_COMMUNITY)
Admission: EM | Admit: 2013-12-14 | Discharge: 2013-12-14 | Disposition: A | Discharge status: Home / Self Care | Payer: Self-pay | Attending: Emergency Medicine | Admitting: Emergency Medicine

## 2013-12-14 VITALS — BP 178/122 | HR 77 | Resp 16

## 2013-12-14 DIAGNOSIS — R51 Headache: Secondary | ICD-10-CM | POA: Insufficient documentation

## 2013-12-14 DIAGNOSIS — Z9861 Coronary angioplasty status: Secondary | ICD-10-CM | POA: Insufficient documentation

## 2013-12-14 DIAGNOSIS — IMO0002 Reserved for concepts with insufficient information to code with codable children: Secondary | ICD-10-CM | POA: Insufficient documentation

## 2013-12-14 DIAGNOSIS — Z7982 Long term (current) use of aspirin: Secondary | ICD-10-CM | POA: Insufficient documentation

## 2013-12-14 DIAGNOSIS — I10 Essential (primary) hypertension: Secondary | ICD-10-CM

## 2013-12-14 DIAGNOSIS — Z8709 Personal history of other diseases of the respiratory system: Secondary | ICD-10-CM | POA: Insufficient documentation

## 2013-12-14 DIAGNOSIS — I2510 Atherosclerotic heart disease of native coronary artery without angina pectoris: Secondary | ICD-10-CM | POA: Insufficient documentation

## 2013-12-14 DIAGNOSIS — Z880 Allergy status to penicillin: Secondary | ICD-10-CM | POA: Insufficient documentation

## 2013-12-14 DIAGNOSIS — E785 Hyperlipidemia, unspecified: Secondary | ICD-10-CM | POA: Insufficient documentation

## 2013-12-14 DIAGNOSIS — Z7902 Long term (current) use of antithrombotics/antiplatelets: Secondary | ICD-10-CM | POA: Insufficient documentation

## 2013-12-14 DIAGNOSIS — Z79899 Other long term (current) drug therapy: Secondary | ICD-10-CM | POA: Insufficient documentation

## 2013-12-14 DIAGNOSIS — I252 Old myocardial infarction: Secondary | ICD-10-CM | POA: Insufficient documentation

## 2013-12-14 LAB — CBC WITH DIFFERENTIAL/PLATELET
Basophils Absolute: 0.1 K/uL (ref 0.0–0.1)
Basophils Relative: 1 % (ref 0–1)
Eosinophils Absolute: 0.3 K/uL (ref 0.0–0.7)
Eosinophils Relative: 6 % — ABNORMAL HIGH (ref 0–5)
HCT: 33.3 % — ABNORMAL LOW (ref 36.0–46.0)
Hemoglobin: 10.8 g/dL — ABNORMAL LOW (ref 12.0–15.0)
Lymphocytes Relative: 41 % (ref 12–46)
Lymphs Abs: 2.5 K/uL (ref 0.7–4.0)
MCH: 27.1 pg (ref 26.0–34.0)
MCHC: 32.4 g/dL (ref 30.0–36.0)
MCV: 83.7 fL (ref 78.0–100.0)
Monocytes Absolute: 0.4 K/uL (ref 0.1–1.0)
Monocytes Relative: 7 % (ref 3–12)
Neutro Abs: 2.7 K/uL (ref 1.7–7.7)
Neutrophils Relative %: 45 % (ref 43–77)
Platelets: 242 K/uL (ref 150–400)
RBC: 3.98 MIL/uL (ref 3.87–5.11)
RDW: 13.9 % (ref 11.5–15.5)
WBC: 6 K/uL (ref 4.0–10.5)

## 2013-12-14 LAB — BASIC METABOLIC PANEL WITH GFR
BUN: 10 mg/dL (ref 6–23)
CO2: 21 meq/L (ref 19–32)
Calcium: 9.2 mg/dL (ref 8.4–10.5)
Chloride: 108 meq/L (ref 96–112)
Creatinine, Ser: 0.73 mg/dL (ref 0.50–1.10)
GFR calc Af Amer: >90 mL/min
GFR calc non Af Amer: >90 mL/min
Glucose, Bld: 101 mg/dL — ABNORMAL HIGH (ref 70–99)
Potassium: 3.7 meq/L (ref 3.7–5.3)
Sodium: 141 meq/L (ref 137–147)

## 2013-12-14 MED ORDER — SODIUM CHLORIDE 0.9 % IV SOLN
INTRAVENOUS | Status: DC
  Administered 2013-12-14: 11:00:00 via INTRAVENOUS

## 2013-12-14 MED ORDER — CLONIDINE HCL 0.1 MG PO TABS
0.2000 mg | ORAL_TABLET | Freq: Once | ORAL | Status: AC
  Administered 2013-12-14: 0.2 mg via ORAL

## 2013-12-14 NOTE — Discharge Instructions (Signed)
Hypertension Hypertension, commonly called high blood pressure, is when the force of blood pumping through your arteries is too strong. Your arteries are the blood vessels that carry blood from your heart throughout your body. A blood pressure reading consists of a higher number over a lower number, such as 110/72. The higher number (systolic) is the pressure inside your arteries when your heart pumps. The lower number (diastolic) is the pressure inside your arteries when your heart relaxes. Ideally you want your blood pressure below 120/80. Hypertension forces your heart to work harder to pump blood. Your arteries may become narrow or stiff. Having hypertension puts you at risk for heart disease, stroke, and other problems.  RISK FACTORS Some risk factors for high blood pressure are controllable. Others are not.  Risk factors you cannot control include:   Race. You may be at higher risk if you are African American.  Age. Risk increases with age.  Gender. Men are at higher risk than women before age 45 years. After age 65, women are at higher risk than men. Risk factors you can control include:  Not getting enough exercise or physical activity.  Being overweight.  Getting too much fat, sugar, calories, or salt in your diet.  Drinking too much alcohol. SIGNS AND SYMPTOMS Hypertension does not usually cause signs or symptoms. Extremely high blood pressure (hypertensive crisis) may cause headache, anxiety, shortness of breath, and nosebleed. DIAGNOSIS  To check if you have hypertension, your health care provider will measure your blood pressure while you are seated, with your arm held at the level of your heart. It should be measured at least twice using the same arm. Certain conditions can cause a difference in blood pressure between your right and left arms. A blood pressure reading that is higher than normal on one occasion does not mean that you need treatment. If one blood pressure reading  is high, ask your health care provider about having it checked again. TREATMENT  Treating high blood pressure includes making lifestyle changes and possibly taking medication. Living a healthy lifestyle can help lower high blood pressure. You may need to change some of your habits. Lifestyle changes may include:  Following the DASH diet. This diet is high in fruits, vegetables, and whole grains. It is low in salt, red meat, and added sugars.  Getting at least 2 1/2 hours of brisk physical activity every week.  Losing weight if necessary.  Not smoking.  Limiting alcoholic beverages.  Learning ways to reduce stress. If lifestyle changes are not enough to get your blood pressure under control, your health care provider may prescribe medicine. You may need to take more than one. Work closely with your health care provider to understand the risks and benefits. HOME CARE INSTRUCTIONS  Have your blood pressure rechecked as directed by your health care provider.   Only take medicine as directed by your health care provider. Follow the directions carefully. Blood pressure medicines must be taken as prescribed. The medicine does not work as well when you skip doses. Skipping doses also puts you at risk for problems.   Do not smoke.   Monitor your blood pressure at home as directed by your health care provider. SEEK MEDICAL CARE IF:   You think you are having a reaction to medicines taken.  You have recurrent headaches or feel dizzy.  You have swelling in your ankles.  You have trouble with your vision. SEEK IMMEDIATE MEDICAL CARE IF:  You develop a severe headache or   confusion.  You have unusual weakness, numbness, or feel faint.  You have severe chest or abdominal pain.  You vomit repeatedly.  You have trouble breathing. MAKE SURE YOU:   Understand these instructions.  Will watch your condition.  Will get help right away if you are not doing well or get  worse. Document Released: 06/15/2005 Document Revised: 06/20/2013 Document Reviewed: 04/07/2013 ExitCare Patient Information 2015 [employer]. This information is not intended to replace advice given to you by your health care provider. Make sure you discuss any questions you have with your health care provider.  

## 2013-12-14 NOTE — ED Provider Notes (Addendum)
CSN: [id number]     Arrival date & time 12/14/13  9147 History   First MD Initiated Contact with Patient 12/14/13 1012     Chief Complaint  Patient presents with  . Hypertension     (Consider location/radiation/quality/duration/timing/severity/associated sxs/prior Treatment) Patient is a 53 y.o. female presenting with hypertension. The history is provided by the patient.  Hypertension   patient here complaining of increased blood pressure x1 week. She's also noted a headache as well 2. Headache is localized to the top portion of her head has been colicky. Has been seen by her physician for this and started on clonidine and she has taken her medications appropriately for 3 days without relief. Denies any nausea or vomiting. No neck pain. No anginal chest pain. No shortness of breath. Some left eye blurry vision but no visual loss. No change in her speech. No focal weakness. Symptoms have been persistent. Patient states that she has had increased amount of stress and has had decreased sleep recently. Patient was given clonidine just prior to arrival by her physician.  Past Medical History  Diagnosis Date  . Hypertension   . Hyperlipemia   . Seasonal allergies   . Sinusitis   . CAD S/P percutaneous coronary angioplasty     Ostial AV G Cx - 2.5 mm Angiosculpt; mid LAD 40-60%  . Non-ST elevation (NSTEMI) myocardial infarction 10/01/2013    Due to Accelerated HTN with existing CAD  . Chronic bronchitis     "frequently; not q yr" (10/03/2013)  . Migraine     "@ least once/month" (10/03/2013)   Past Surgical History  Procedure Laterality Date  . Cesarean section  1989  . Nasal septoplasty w/ turbinoplasty  ~ 2007  . Coronary angioplasty  10/03/2013    95% ostial AV G Cx - 2.5 mm AngioSculpt Balloon PTCA; mid LAD 40-60%  . Tubal ligation  1989  . Abdominal hysterectomy  1994    "partial"   No family history on file. History  Substance Use Topics  . Smoking status: Never Smoker   .  Smokeless tobacco: Never Used  . Alcohol Use: No   OB History   Grav Para Term Preterm Abortions TAB SAB Ect Mult Living                 Review of Systems  All other systems reviewed and are negative.     Allergies  Penicillins  Home Medications   Prior to Admission medications   Medication Sig Start Date End Date Taking? Authorizing Provider  aspirin EC 81 MG EC tablet Take 1 tablet (81 mg total) by mouth daily. 10/04/13   Geradine Girt, DO  carvedilol (COREG) 25 MG tablet Take 1 tablet (25 mg total) by mouth 2 (two) times daily. 11/30/13   Angelica Chessman, MD  fluticasone (FLONASE) 50 MCG/ACT nasal spray Place 1 spray into both nostrils 2 (two) times daily. 10/18/13   Renella Cunas, MD  isosorbide mononitrate (IMDUR) 60 MG 24 hr tablet Take 1 tablet (60 mg total) by mouth daily. 11/30/13   Angelica Chessman, MD  losartan (COZAAR) 100 MG tablet Take 1 tablet (100 mg total) by mouth daily. 10/18/13   Renella Cunas, MD  lovastatin (MEVACOR) 40 MG tablet Take 1 tablet (40 mg total) by mouth at bedtime. 11/30/13   Angelica Chessman, MD  Ticagrelor (BRILINTA) 90 MG TABS tablet Take 1 tablet (90 mg total) by mouth 2 (two) times daily. 10/12/13   [name], MD  BP 188/105  Pulse 72  Temp(Src) 98.2 F (36.8 C) (Oral)  Resp 20  SpO2 100% Physical Exam  Nursing note and vitals reviewed. Constitutional: She is oriented to person, place, and time. She appears well-developed and well-nourished.  Non-toxic appearance. No distress.  HENT:  Head: Normocephalic and atraumatic.  Eyes: Conjunctivae, EOM and lids are normal. Pupils are equal, round, and reactive to light.  Neck: Normal range of motion. Neck supple. No tracheal deviation present. No mass present.  Cardiovascular: Normal rate, regular rhythm and normal heart sounds.  Exam reveals no gallop.   No murmur heard. Pulmonary/Chest: Effort normal and breath sounds normal. No stridor. No respiratory distress. She has no decreased  breath sounds. She has no wheezes. She has no rhonchi. She has no rales.  Abdominal: Soft. Normal appearance and bowel sounds are normal. She exhibits no distension. There is no tenderness. There is no rebound and no CVA tenderness.  Musculoskeletal: Normal range of motion. She exhibits no edema and no tenderness.  Neurological: She is alert and oriented to person, place, and time. She has normal strength. No cranial nerve deficit or sensory deficit. GCS eye subscore is 4. GCS verbal subscore is 5. GCS motor subscore is 6.  Skin: Skin is warm and dry. No abrasion and no rash noted.  Psychiatric: She has a normal mood and affect. Her speech is normal and behavior is normal.    ED Course  Procedures (including critical care time) Labs Review Labs Reviewed  CBC WITH DIFFERENTIAL  BASIC METABOLIC PANEL    Imaging Review No results found.   EKG Interpretation   Date/Time:  Thursday December 14 2013 10:34:51 EDT Ventricular Rate:  68 PR Interval:  138 QRS Duration: 96 QT Interval:  409 QTC Calculation: 435 R Axis:     Text Interpretation:  Sinus rhythm Borderline T abnormalities, diffuse  leads Confirmed by Zenia Resides  MD, ANTHONY ([id number]) on 12/14/2013 12:44:58 PM      MDM   Final diagnoses:  None    Patient's blood pressure has been stable here. No evidence of hemorrhage on head CT. Neurological exam is normal. She is to followup with her Dr. for blood pressure management    Leota Jacobsen, MD 12/14/13 1244  Leota Jacobsen, MD 12/14/13 1245

## 2013-12-14 NOTE — Progress Notes (Signed)
Patient here today for BP check. Patient states she is taking her medications in the morning. Patient does states she is having some tingling in her bottom lip, some shortness of breath, and headaches. Clonidine 0.2 mg given and Dr. Doreene Burke notified. Per Dr. Doreene Burke patient referred to ED. Informed patient about MD decision to go to ED for further evaluation. Patient stated she did not want to go to ED.

## 2013-12-14 NOTE — Progress Notes (Signed)
[facility] provided pt with a list of primary care resources and a [facility] [employer] application to help patient establish primary care.  °

## 2013-12-14 NOTE — ED Notes (Signed)
MD at bedside. 

## 2013-12-14 NOTE — ED Notes (Signed)
Pt presents with HTN, states she has been having blurred vision in left eye along with headache off and on. Pt states she has been having numbness and tingling in left lower lip off and on over the past week.

## 2013-12-14 NOTE — Patient Instructions (Signed)
Your PCP wants you go to the ED for further evaluation. Spoke to [place], Therapist, sports and report given. Vivia Birmingham, RN

## 2013-12-27 ENCOUNTER — Encounter: Payer: Self-pay | Admitting: Internal Medicine

## 2013-12-27 ENCOUNTER — Ambulatory Visit: Payer: Self-pay | Attending: Internal Medicine | Admitting: Internal Medicine

## 2013-12-27 VITALS — BP 166/95 | HR 76 | Temp 98.5°F | Resp 16 | Ht 70.0 in | Wt 247.0 lb

## 2013-12-27 DIAGNOSIS — Z0000 Encounter for general adult medical examination without abnormal findings: Secondary | ICD-10-CM | POA: Insufficient documentation

## 2013-12-27 DIAGNOSIS — J069 Acute upper respiratory infection, unspecified: Secondary | ICD-10-CM | POA: Insufficient documentation

## 2013-12-27 DIAGNOSIS — I10 Essential (primary) hypertension: Secondary | ICD-10-CM | POA: Insufficient documentation

## 2013-12-27 DIAGNOSIS — Z124 Encounter for screening for malignant neoplasm of cervix: Secondary | ICD-10-CM

## 2013-12-27 MED ORDER — CARVEDILOL 25 MG PO TABS: 25.0000 mg | ORAL_TABLET | Freq: Two times a day (BID) | ORAL | Status: DC

## 2013-12-27 MED ORDER — AZITHROMYCIN 250 MG PO TABS: ORAL_TABLET | ORAL | Status: DC

## 2013-12-27 NOTE — Progress Notes (Signed)
Pt here for physical with pap smear/std screen/HIV testing Pt denies vaginal d/c or discomfort Last normal pap smear 2012 Pt has partial hysterectomy C/o uri sx's BP elevated 166/95 76. Er doctor informed pt she made need meds adjusted

## 2013-12-27 NOTE — Progress Notes (Signed)
Patient ID: Christine Barrett, female   DOB: 1960/08/29, 53 y.o.   MRN: [id number]  CC: physical  HPI:  Patient reports that she is here for a physical and pap.  Patient reports that she has had a cough with yellow mucous production for 4 days.  Patient denies fevers or chills. Patient denies vaginal discharge, lesions, odor, or itch. Patient reports that she takes her anti-hypertensive medication daily, but is under a ton of stress from her twin sister passing away this week.  No c/o today.    Allergies  Allergen Reactions  . Penicillins Rash    Pt states she has taken since intial  reaction without recurrence.    Past Medical History  Diagnosis Date  . Hypertension   . Hyperlipemia   . Seasonal allergies   . Sinusitis   . CAD S/P percutaneous coronary angioplasty     Ostial AV G Cx - 2.5 mm Angiosculpt; mid LAD 40-60%  . Non-ST elevation (NSTEMI) myocardial infarction 10/01/2013    Due to Accelerated HTN with existing CAD  . Chronic bronchitis     "frequently; not q yr" (10/03/2013)  . Migraine     "@ least once/month" (10/03/2013)   Current Outpatient Prescriptions on File Prior to Visit  Medication Sig Dispense Refill  . aspirin EC 81 MG EC tablet Take 1 tablet (81 mg total) by mouth daily.      . carvedilol (COREG) 25 MG tablet Take 1 tablet (25 mg total) by mouth 2 (two) times daily.  180 tablet  3  . cetirizine (ZYRTEC) 10 MG tablet Take 10 mg by mouth daily.      . fluticasone (FLONASE) 50 MCG/ACT nasal spray Place 1 spray into both nostrils 2 (two) times daily as needed for allergies or rhinitis.      [name] isosorbide mononitrate (IMDUR) 60 MG 24 hr tablet Take 1 tablet (60 mg total) by mouth daily.  30 tablet  6  . losartan (COZAAR) 100 MG tablet Take 1 tablet (100 mg total) by mouth daily.  90 tablet  3  . lovastatin (MEVACOR) 40 MG tablet Take 1 tablet (40 mg total) by mouth at bedtime.  90 tablet  3  . ticagrelor (BRILINTA) 90 MG TABS tablet Take 90 mg by mouth 2 (two) times daily.        No current facility-administered medications on file prior to visit.   History reviewed. No pertinent family history. History   Social History  . Marital Status: Married    Spouse Name: N/A    Number of Children: N/A  . Years of Education: N/A   Occupational History  . Not on file.   Social History Main Topics  . Smoking status: Never Smoker   . Smokeless tobacco: Never Used  . Alcohol Use: No  . Drug Use: No  . Sexual Activity: Yes   Other Topics Concern  . Not on file   Social History Narrative  . No narrative on file   Review of Systems  Constitutional: Negative for fever and chills.  Eyes: Negative.   Respiratory: Positive for cough (at night) and sputum production (yellow).   Cardiovascular: Negative.   Gastrointestinal: Negative.   Genitourinary: Negative.   Musculoskeletal: Negative.   Neurological: Positive for headaches. Negative for dizziness.  Psychiatric/Behavioral: Positive for depression (sister passed this week).      Objective:   Filed Vitals:   12/27/13 1017  BP: 166/95  Pulse: 76  Temp: 98.5 F (36.9 C)  Resp:  16    Physical Exam  Constitutional: She is oriented to person, place, and time. No distress.  HENT:  Right Ear: External ear normal.  Left Ear: External ear normal.  Mouth/Throat: Oropharynx is clear and moist.  Eyes: Conjunctivae and EOM are normal. Pupils are equal, round, and reactive to light.  Neck: Normal range of motion. Neck supple. No thyromegaly present.  Cardiovascular: Normal rate, regular rhythm, normal heart sounds and intact distal pulses.   Pulmonary/Chest: Effort normal and breath sounds normal.  Abdominal: Soft. Bowel sounds are normal. She exhibits no distension. There is no tenderness.  Genitourinary: Cervix normal, right adnexa normal and left adnexa normal. No vaginal discharge found.  Musculoskeletal: Normal range of motion.  Lymphadenopathy:    She has no cervical adenopathy.  Neurological: She is  alert and oriented to person, place, and time. She has normal reflexes.  Skin: Skin is warm and dry.  Psychiatric: Affect and judgment normal.     Lab Results  Component Value Date   WBC 6.0 12/14/2013   HGB 10.8* 12/14/2013   HCT 33.3* 12/14/2013   MCV 83.7 12/14/2013   PLT 242 12/14/2013   Lab Results  Component Value Date   CREATININE 0.73 12/14/2013   BUN 10 12/14/2013   NA 141 12/14/2013   K 3.7 12/14/2013   CL 108 12/14/2013   CO2 21 12/14/2013    Lab Results  Component Value Date   HGBA1C 5.9* 10/01/2013   Lipid Panel     Component Value Date/Time   CHOL 251* 10/01/2013 2104   TRIG 110 10/01/2013 2104   HDL 44 10/01/2013 2104   CHOLHDL 5.7 10/01/2013 2104   VLDL 22 10/01/2013 2104   LDLCALC 185* 10/01/2013 2104       Assessment and plan:   Christine Barrett was seen today for annual exam.  Diagnoses and associated orders for this visit:  Essential hypertension - carvedilol (COREG) 25 MG tablet; Take 1 tablet (25 mg total) by mouth 2 (two) times daily.  Preventative health care - Cervicovaginal ancillary only - HIV antibody (with reflex) - RPR - Cytology - PAP [place]  Acute upper respiratory infections of unspecified site - azithromycin (ZITHROMAX) 250 MG tablet; Take 2 pills today, and one each day after  Papanicolaou smear - Cervicovaginal ancillary only  Return in about 1 week (around 01/03/2014) for Nurse Visit.       Christine Manning, NP-C [facility] [phone] 01/02/2014, 12:40 PM

## 2013-12-28 LAB — CYTOLOGY - PAP

## 2013-12-28 LAB — HIV ANTIBODY (ROUTINE TESTING W REFLEX): HIV: NONREACTIVE

## 2013-12-28 LAB — RPR

## 2013-12-31 ENCOUNTER — Other Ambulatory Visit: Payer: Self-pay | Admitting: Oncology

## 2014-01-03 ENCOUNTER — Ambulatory Visit: Payer: Self-pay | Attending: Cardiology | Admitting: Cardiology

## 2014-01-03 ENCOUNTER — Encounter: Payer: Self-pay | Admitting: Cardiology

## 2014-01-03 VITALS — BP 153/73 | HR 72 | Temp 98.5°F | Resp 20 | Ht 70.0 in | Wt 249.0 lb

## 2014-01-03 DIAGNOSIS — Z9861 Coronary angioplasty status: Secondary | ICD-10-CM

## 2014-01-03 DIAGNOSIS — E785 Hyperlipidemia, unspecified: Secondary | ICD-10-CM | POA: Insufficient documentation

## 2014-01-03 DIAGNOSIS — I10 Essential (primary) hypertension: Secondary | ICD-10-CM | POA: Insufficient documentation

## 2014-01-03 DIAGNOSIS — Z803 Family history of malignant neoplasm of breast: Secondary | ICD-10-CM | POA: Insufficient documentation

## 2014-01-03 DIAGNOSIS — I119 Hypertensive heart disease without heart failure: Secondary | ICD-10-CM

## 2014-01-03 DIAGNOSIS — I2510 Atherosclerotic heart disease of native coronary artery without angina pectoris: Secondary | ICD-10-CM | POA: Insufficient documentation

## 2014-01-03 NOTE — Assessment & Plan Note (Signed)
Her LDLs not at goal in April. We'll check fasting lipids and liver function studies.

## 2014-01-03 NOTE — Assessment & Plan Note (Signed)
Stable without any ischemic symptoms. Continue secondary preventative therapy. She will finish out Brilinta samples and then discontinue. She will continue aspirin after that. I will see her back again in 6 months.

## 2014-01-03 NOTE — Progress Notes (Signed)
Patient here for Cardiology follow-up. Hx of HTN, hyperlipidemia, CAD-s/p coronary angioplasty. Denies chest pain, headaches, dizziness, swelling. Intermittent shortness of breath. Recently lost sister 2 weeks ago.

## 2014-01-03 NOTE — Patient Instructions (Signed)
Please continue taking medications as prescribed. Return for fasting labwork.  Thanks so much!

## 2014-01-03 NOTE — Progress Notes (Signed)
HPI Christine Barrett returns today for close cardiac followup after having a non-ST segment elevated MI in April. At that time she had percutaneous coronary angioplasty of a tight ostial AV groove circumflex. She has a residual 60% in the mid LAD. LV systolic function is maintained.  She is taking all of her medications. Her biggest issue today was losing her sister who is her best friend from breast cancer recently. Her blood pressure was out of control requiring a visit to the emergency room on one occasion. She says today is the best it's been. She is back at work and is actually going to work with a Physiological scientist starting next week for exercise and weight loss. She is borderline diabetic.  She cannot afford the Brilinta. She has one and a  half months of samples left.  She denies any chest pain or ischemic symptoms.  Past Medical History  Diagnosis Date  . Hypertension   . Hyperlipemia   . Seasonal allergies   . Sinusitis   . CAD S/P percutaneous coronary angioplasty     Ostial AV G Cx - 2.5 mm Angiosculpt; mid LAD 40-60%  . Non-ST elevation (NSTEMI) myocardial infarction 10/01/2013    Due to Accelerated HTN with existing CAD  . Chronic bronchitis     "frequently; not q yr" (10/03/2013)  . Migraine     "@ least once/month" (10/03/2013)    Current Outpatient Prescriptions  Medication Sig Dispense Refill  . aspirin EC 81 MG EC tablet Take 1 tablet (81 mg total) by mouth daily.      . carvedilol (COREG) 25 MG tablet Take 1 tablet (25 mg total) by mouth 2 (two) times daily.  180 tablet  3  . cetirizine (ZYRTEC) 10 MG tablet Take 10 mg by mouth daily.      . fluticasone (FLONASE) 50 MCG/ACT nasal spray Place 1 spray into both nostrils 2 (two) times daily as needed for allergies or rhinitis.      [name] isosorbide mononitrate (IMDUR) 60 MG 24 hr tablet Take 1 tablet (60 mg total) by mouth daily.  30 tablet  6  . losartan (COZAAR) 100 MG tablet Take 1 tablet (100 mg total) by mouth daily.  90 tablet  3   . lovastatin (MEVACOR) 40 MG tablet Take 1 tablet (40 mg total) by mouth at bedtime.  90 tablet  3  . ticagrelor (BRILINTA) 90 MG TABS tablet Take 90 mg by mouth 2 (two) times daily.       No current facility-administered medications for this visit.    Allergies  Allergen Reactions  . Penicillins Rash    Pt states she has taken since intial  reaction without recurrence.     History reviewed. No pertinent family history.  History   Social History  . Marital Status: Married    Spouse Name: N/A    Number of Children: N/A  . Years of Education: N/A   Occupational History  . Not on file.   Social History Main Topics  . Smoking status: Never Smoker   . Smokeless tobacco: Never Used  . Alcohol Use: No  . Drug Use: No  . Sexual Activity: Yes   Other Topics Concern  . Not on file   Social History Narrative  . No narrative on file    ROS ALL NEGATIVE EXCEPT THOSE NOTED IN HPI  PE  General Appearance: well developed, well nourished in no acute distress, overweight, well-dressed HEENT: symmetrical face, PERRLA, good dentition  Neck:  no JVD, thyromegaly, or adenopathy, trachea midline Chest: symmetric without deformity Cardiac: PMI non-displaced, RRR, normal S1, S2, no gallop or murmur Lung: clear to ausculation and percussion Vascular: all pulses full without bruits  Abdominal: nondistended, nontender, good bowel sounds, no HSM, no bruits Extremities: no cyanosis, clubbing or edema, no sign of DVT, no varicosities  Skin: normal color, no rashes Neuro: alert and oriented x 3, non-focal Pysch: normal affect  EKG Not repeated BMET    Component Value Date/Time   NA 141 12/14/2013 1040   K 3.7 12/14/2013 1040   CL 108 12/14/2013 1040   CO2 21 12/14/2013 1040   GLUCOSE 101* 12/14/2013 1040   BUN 10 12/14/2013 1040   CREATININE 0.73 12/14/2013 1040   CREATININE 0.69 11/30/2013 1223   CALCIUM 9.2 12/14/2013 1040   GFRNONAA >90 12/14/2013 1040   GFRNONAA >89 11/30/2013 1223    GFRAA >90 12/14/2013 1040   GFRAA >89 11/30/2013 1223    Lipid Panel     Component Value Date/Time   CHOL 251* 10/01/2013 2104   TRIG 110 10/01/2013 2104   HDL 44 10/01/2013 2104   CHOLHDL 5.7 10/01/2013 2104   VLDL 22 10/01/2013 2104   LDLCALC 185* 10/01/2013 2104    CBC    Component Value Date/Time   WBC 6.0 12/14/2013 1040   RBC 3.98 12/14/2013 1040   HGB 10.8* 12/14/2013 1040   HCT 33.3* 12/14/2013 1040   PLT 242 12/14/2013 1040   MCV 83.7 12/14/2013 1040   MCH 27.1 12/14/2013 1040   MCHC 32.4 12/14/2013 1040   RDW 13.9 12/14/2013 1040   LYMPHSABS 2.5 12/14/2013 1040   MONOABS 0.4 12/14/2013 1040   EOSABS 0.3 12/14/2013 1040   BASOSABS 0.1 12/14/2013 1040

## 2014-01-03 NOTE — Assessment & Plan Note (Signed)
Better control today. I've encouraged her to continue all medications, I reviewed a salt restriction diet, and encouraged exercise with her trainer and to lose weight.

## 2014-01-04 ENCOUNTER — Other Ambulatory Visit: Payer: Self-pay | Admitting: Oncology

## 2014-01-10 ENCOUNTER — Other Ambulatory Visit: Payer: Self-pay

## 2014-01-15 ENCOUNTER — Telehealth: Payer: Self-pay | Admitting: *Deleted

## 2014-01-15 NOTE — Telephone Encounter (Signed)
Message copied by Velora Heckler on Mon Jan 15, 2014  3:09 PM ------      Message from: Chari Manning A      Created: Mon Jan 01, 2014  3:26 PM       Let patient know all results are negative and Pap is normal. Will not need repeat Pap for 3 years. ------

## 2014-01-15 NOTE — Telephone Encounter (Signed)
Left message for patient to return call to discuss lab results.  

## 2014-01-18 ENCOUNTER — Ambulatory Visit: Payer: Self-pay | Attending: Internal Medicine

## 2014-01-18 DIAGNOSIS — E785 Hyperlipidemia, unspecified: Secondary | ICD-10-CM

## 2014-01-18 LAB — LIPID PANEL
CHOLESTEROL: 191 mg/dL (ref 0–200)
HDL: 42 mg/dL (ref >39)
LDL Cholesterol: 120 mg/dL — ABNORMAL HIGH (ref 0–99)
Total CHOL/HDL Ratio: 4.5 Ratio
Triglycerides: 147 mg/dL (ref <150)
VLDL: 29 mg/dL (ref 0–40)

## 2014-01-18 LAB — HEPATIC FUNCTION PANEL
ALBUMIN: 3.7 g/dL (ref 3.5–5.2)
ALT: 12 U/L (ref 0–35)
AST: 13 U/L (ref 0–37)
Alkaline Phosphatase: 63 U/L (ref 39–117)
Bilirubin, Direct: 0.1 mg/dL (ref 0.0–0.3)
Indirect Bilirubin: 0.7 mg/dL (ref 0.2–1.2)
TOTAL PROTEIN: 6.5 g/dL (ref 6.0–8.3)
Total Bilirubin: 0.8 mg/dL (ref 0.2–1.2)

## 2014-01-18 NOTE — Progress Notes (Signed)
Reviewed lab results and instructions with patient at today's lab appointment.

## 2014-01-19 ENCOUNTER — Ambulatory Visit: Payer: Self-pay | Admitting: Internal Medicine

## 2014-01-24 ENCOUNTER — Telehealth: Payer: Self-pay

## 2014-01-24 DIAGNOSIS — E785 Hyperlipidemia, unspecified: Secondary | ICD-10-CM

## 2014-01-24 MED ORDER — ATORVASTATIN CALCIUM 40 MG PO TABS: 40.0000 mg | ORAL_TABLET | Freq: Every day | ORAL | Status: DC

## 2014-01-24 NOTE — Telephone Encounter (Signed)
Patient notified of lab results, and patient verbalized understanding.  Mevacor discontinued and atorvastatin 40 mg at night ordered. Patient scheduled for repeat lipid panel and LFTs in 3 months. Future orders entered. Patient verbalized understanding.

## 2014-01-25 ENCOUNTER — Encounter: Payer: Self-pay | Admitting: Internal Medicine

## 2014-01-25 ENCOUNTER — Ambulatory Visit: Payer: Self-pay | Attending: Internal Medicine | Admitting: Internal Medicine

## 2014-01-25 VITALS — BP 130/74 | HR 78 | Temp 98.6°F | Resp 18 | Ht 70.0 in | Wt 256.4 lb

## 2014-01-25 DIAGNOSIS — I252 Old myocardial infarction: Secondary | ICD-10-CM | POA: Insufficient documentation

## 2014-01-25 DIAGNOSIS — Z7982 Long term (current) use of aspirin: Secondary | ICD-10-CM | POA: Insufficient documentation

## 2014-01-25 DIAGNOSIS — M6740 Ganglion, unspecified site: Secondary | ICD-10-CM | POA: Insufficient documentation

## 2014-01-25 DIAGNOSIS — I10 Essential (primary) hypertension: Secondary | ICD-10-CM | POA: Insufficient documentation

## 2014-01-25 DIAGNOSIS — G43909 Migraine, unspecified, not intractable, without status migrainosus: Secondary | ICD-10-CM | POA: Insufficient documentation

## 2014-01-25 DIAGNOSIS — Z7901 Long term (current) use of anticoagulants: Secondary | ICD-10-CM | POA: Insufficient documentation

## 2014-01-25 DIAGNOSIS — Z880 Allergy status to penicillin: Secondary | ICD-10-CM | POA: Insufficient documentation

## 2014-01-25 NOTE — Progress Notes (Signed)
Patient presents for 4 week history discoloration left 5th digit States painful when bumped

## 2014-01-25 NOTE — Patient Instructions (Signed)

## 2014-01-25 NOTE — Progress Notes (Signed)
Patient ID: Christine Barrett, female   DOB: Mar 10, 1961, 53 y.o.   MRN: [id number]  CC: "painful nodule in the left pinky"  HPI:   Christine Barrett presents today for a follow up visit. She has a a tender nodule on her left finger.  She reports approximately 4 weeks ago she noticed a small painful nodule on her left pinky finger. The pain is intermittent with activity and palpation. With palpation the pain is an 8-9 on a scale of 0-10. She denies any joint swelling or pain. Denies easily bleeding. Denies history of arthritis in the her or her family.She has a history of a non-st elevation myocardial infarction in April, hypertension, and hyperlipidemia.  Allergies  Allergen Reactions  . Penicillins Rash    Pt states she has taken since intial  reaction without recurrence.    Past Medical History  Diagnosis Date  . Hypertension   . Hyperlipemia   . Seasonal allergies   . Sinusitis   . CAD S/P percutaneous coronary angioplasty     Ostial AV G Cx - 2.5 mm Angiosculpt; mid LAD 40-60%  . Non-ST elevation (NSTEMI) myocardial infarction 10/01/2013    Due to Accelerated HTN with existing CAD  . Chronic bronchitis     "frequently; not q yr" (10/03/2013)  . Migraine     "@ least once/month" (10/03/2013)   Current Outpatient Prescriptions on File Prior to Visit  Medication Sig Dispense Refill  . aspirin EC 81 MG EC tablet Take 1 tablet (81 mg total) by mouth daily.      . carvedilol (COREG) 25 MG tablet Take 1 tablet (25 mg total) by mouth 2 (two) times daily.  180 tablet  3  . cetirizine (ZYRTEC) 10 MG tablet Take 10 mg by mouth daily.      . fluticasone (FLONASE) 50 MCG/ACT nasal spray Place 1 spray into both nostrils 2 (two) times daily as needed for allergies or rhinitis.      [name] isosorbide mononitrate (IMDUR) 60 MG 24 hr tablet Take 1 tablet (60 mg total) by mouth daily.  30 tablet  6  . losartan (COZAAR) 100 MG tablet Take 1 tablet (100 mg total) by mouth daily.  90 tablet  3  . ticagrelor (BRILINTA) 90 MG  TABS tablet Take 90 mg by mouth 2 (two) times daily.      [name] atorvastatin (LIPITOR) 40 MG tablet Take 1 tablet (40 mg total) by mouth daily.  90 tablet  3   No current facility-administered medications on file prior to visit.   History reviewed. No pertinent family history. History   Social History  . Marital Status: Married    Spouse Name: N/A    Number of Children: N/A  . Years of Education: N/A   Occupational History  . Not on file.   Social History Main Topics  . Smoking status: Never Smoker   . Smokeless tobacco: Never Used  . Alcohol Use: No  . Drug Use: No  . Sexual Activity: Yes   Other Topics Concern  . Not on file   Social History Narrative  . No narrative on file    Review of Systems  Constitutional: Negative.   Eyes: Negative.   Respiratory: Negative.   Cardiovascular: Negative.   Gastrointestinal: Negative.   Genitourinary: Negative.   Musculoskeletal: Negative.   Skin: Negative.   Neurological: Positive for headaches.  Endo/Heme/Allergies: Negative.   Psychiatric/Behavioral: Negative.      Objective:   Filed Vitals:   01/25/14 1413  BP: 130/74  Pulse: 78  Temp: 98.6 F (37 C)  Resp: 18    Physical Exam  Constitutional: She is oriented to person, place, and time. Vital signs are normal. She appears well-developed and well-nourished.  HENT:  Head: Normocephalic.  Eyes: Conjunctivae, EOM and lids are normal. Pupils are equal, round, and reactive to light.  Neck: Normal range of motion. Neck supple. No thyromegaly present.  Cardiovascular: Normal rate, regular rhythm, S1 normal, S2 normal and normal heart sounds.   Pulmonary/Chest: Effort normal and breath sounds normal.  Abdominal: Soft. Normal appearance and bowel sounds are normal.  Musculoskeletal:       Left hand: She exhibits tenderness and swelling.       Hands: Small tender nodule to palpation denies any numbness or tingling  Neurological: She is alert and oriented to person,  place, and time. She has normal strength. No cranial nerve deficit.  Skin: Skin is warm, dry and intact. No abrasion, no ecchymosis and no rash noted.  Psychiatric: She has a normal mood and affect. Her speech is normal and behavior is normal. Judgment and thought content normal. Cognition and memory are normal.    Lab Results  Component Value Date   WBC 6.0 12/14/2013   HGB 10.8* 12/14/2013   HCT 33.3* 12/14/2013   MCV 83.7 12/14/2013   PLT 242 12/14/2013   Lab Results  Component Value Date   CREATININE 0.73 12/14/2013   BUN 10 12/14/2013   NA 141 12/14/2013   K 3.7 12/14/2013   CL 108 12/14/2013   CO2 21 12/14/2013    Lab Results  Component Value Date   HGBA1C 5.9* 10/01/2013   Lipid Panel     Component Value Date/Time   CHOL 191 01/18/2014 0913   TRIG 147 01/18/2014 0913   HDL 42 01/18/2014 0913   CHOLHDL 4.5 01/18/2014 0913   VLDL 29 01/18/2014 0913   LDLCALC 120* 01/18/2014 0913       Assessment and plan:   Christine Barrett was seen today for hand pain.  Diagnoses and associated orders for this visit:  Ganglion cyst If she would like removal by aspiration she may have referral to surgery.  Will call patient back with details.  Told patient she may try tylenol for pain if needed.   RTC if symptoms worsen or fail to improve.      Chari Manning, NP-C [facility] [phone] 01/25/2014, 2:37 PM

## 2014-01-29 ENCOUNTER — Telehealth: Payer: Self-pay | Admitting: *Deleted

## 2014-01-29 NOTE — Telephone Encounter (Signed)
Called to let patient know about cyst aspiration cost. Patient phone went straight to voicemail. Left a voicemail for patient to return call.

## 2014-02-01 ENCOUNTER — Telehealth: Payer: Self-pay | Admitting: Cardiology

## 2014-02-01 NOTE — Telephone Encounter (Signed)
Pt. Called for advice on how to be able to work better with her condition. Please f/u with pt.

## 2014-02-01 NOTE — Telephone Encounter (Signed)
Can you please follow up with this patient? Thanks.

## 2014-02-09 ENCOUNTER — Telehealth: Payer: Self-pay

## 2014-02-09 NOTE — Telephone Encounter (Signed)
Placed call to patient to follow-up on cost of ganglion cyst aspiration as well as to determine patient's medical concerns. Unable to reach patient; left voicemail requesting return call.

## 2014-02-15 ENCOUNTER — Telehealth: Payer: Self-pay | Admitting: Internal Medicine

## 2014-02-15 NOTE — Telephone Encounter (Signed)
Pt. Returning nurses call, please f/u with pt.

## 2014-02-21 ENCOUNTER — Ambulatory Visit: Payer: Self-pay | Attending: Cardiology | Admitting: Cardiology

## 2014-02-21 ENCOUNTER — Encounter: Payer: Self-pay | Admitting: Cardiology

## 2014-02-21 VITALS — BP 173/96 | HR 75 | Temp 98.7°F | Resp 16 | Ht 70.0 in | Wt 258.0 lb

## 2014-02-21 DIAGNOSIS — J309 Allergic rhinitis, unspecified: Secondary | ICD-10-CM | POA: Insufficient documentation

## 2014-02-21 DIAGNOSIS — J42 Unspecified chronic bronchitis: Secondary | ICD-10-CM | POA: Insufficient documentation

## 2014-02-21 DIAGNOSIS — E785 Hyperlipidemia, unspecified: Secondary | ICD-10-CM

## 2014-02-21 DIAGNOSIS — I10 Essential (primary) hypertension: Secondary | ICD-10-CM

## 2014-02-21 DIAGNOSIS — D649 Anemia, unspecified: Secondary | ICD-10-CM

## 2014-02-21 DIAGNOSIS — I2510 Atherosclerotic heart disease of native coronary artery without angina pectoris: Secondary | ICD-10-CM | POA: Insufficient documentation

## 2014-02-21 DIAGNOSIS — Z880 Allergy status to penicillin: Secondary | ICD-10-CM | POA: Insufficient documentation

## 2014-02-21 DIAGNOSIS — Z7982 Long term (current) use of aspirin: Secondary | ICD-10-CM | POA: Insufficient documentation

## 2014-02-21 DIAGNOSIS — R0602 Shortness of breath: Secondary | ICD-10-CM | POA: Insufficient documentation

## 2014-02-21 DIAGNOSIS — G43909 Migraine, unspecified, not intractable, without status migrainosus: Secondary | ICD-10-CM | POA: Insufficient documentation

## 2014-02-21 DIAGNOSIS — I252 Old myocardial infarction: Secondary | ICD-10-CM | POA: Insufficient documentation

## 2014-02-21 DIAGNOSIS — I160 Hypertensive urgency: Secondary | ICD-10-CM

## 2014-02-21 DIAGNOSIS — D259 Leiomyoma of uterus, unspecified: Secondary | ICD-10-CM

## 2014-02-21 DIAGNOSIS — Z9861 Coronary angioplasty status: Secondary | ICD-10-CM | POA: Insufficient documentation

## 2014-02-21 LAB — ANEMIA PANEL
%SAT: 25 % (ref 20–55)
ABS Retic: 65.3 10*3/uL (ref 19.0–186.0)
Ferritin: 264 ng/mL (ref 10–291)
Folate: 6.9 ng/mL
IRON: 63 ug/dL (ref 42–145)
RBC.: 4.35 MIL/uL (ref 3.87–5.11)
Retic Ct Pct: 1.5 % (ref 0.4–2.3)
TIBC: 248 ug/dL — ABNORMAL LOW (ref 250–470)
UIBC: 185 ug/dL (ref 125–400)
VITAMIN B 12: 335 pg/mL (ref 211–911)

## 2014-02-21 LAB — CBC WITH DIFFERENTIAL/PLATELET
Basophils Absolute: 0.1 10*3/uL (ref 0.0–0.1)
Basophils Relative: 1 % (ref 0–1)
EOS ABS: 0.3 10*3/uL (ref 0.0–0.7)
Eosinophils Relative: 5 % (ref 0–5)
HEMATOCRIT: 35.6 % — AB (ref 36.0–46.0)
HEMOGLOBIN: 11.8 g/dL — AB (ref 12.0–15.0)
LYMPHS ABS: 2.4 10*3/uL (ref 0.7–4.0)
Lymphocytes Relative: 37 % (ref 12–46)
MCH: 27.1 pg (ref 26.0–34.0)
MCHC: 33.1 g/dL (ref 30.0–36.0)
MCV: 81.8 fL (ref 78.0–100.0)
MONOS PCT: 7 % (ref 3–12)
Monocytes Absolute: 0.4 10*3/uL (ref 0.1–1.0)
NEUTROS PCT: 50 % (ref 43–77)
Neutro Abs: 3.2 10*3/uL (ref 1.7–7.7)
Platelets: 214 10*3/uL (ref 150–400)
RBC: 4.35 MIL/uL (ref 3.87–5.11)
RDW: 14.7 % (ref 11.5–15.5)
WBC: 6.4 10*3/uL (ref 4.0–10.5)

## 2014-02-21 MED ORDER — AMLODIPINE BESYLATE 5 MG PO TABS: 5.0000 mg | ORAL_TABLET | Freq: Every day | ORAL | Status: DC

## 2014-02-21 NOTE — Progress Notes (Signed)
HPI Christine Barrett returns today because of shortness of breath. 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 She denies any chest pain or angina.  Her blood pressure is not well-controlled. When she was in the hospital in April it was in the 220 range. It has improved on triple drug therapy. She took her medications this morning about an hour ago.  She also has anemia with her last hemoglobin being 10.9 with a normal MCV. TSH was recently done and was normal. She has not had an anemia workup. She denies any overt bleeding and has had a hysterectomy for fibroids years ago. She denies any melena. She is only on aspirin now.  She denies orthopnea, PND or edema. She is tolerating workouts well as long as it is not too hot. She continues to work with a Physiological scientist. Her ejection fraction in April was 60%. She does have mild left ventricular hypertrophy.  Past Medical History  Diagnosis Date  . Hypertension   . Hyperlipemia   . Seasonal allergies   . Sinusitis   . CAD S/P percutaneous coronary angioplasty     Ostial AV G Cx - 2.5 mm Angiosculpt; mid LAD 40-60%  . Non-ST elevation (NSTEMI) myocardial infarction 10/01/2013    Due to Accelerated HTN with existing CAD  . Chronic bronchitis     "frequently; not q yr" (10/03/2013)  . Migraine     "@ least once/month" (10/03/2013)    Current Outpatient Prescriptions  Medication Sig Dispense Refill  . aspirin EC 81 MG EC tablet Take 1 tablet (81 mg total) by mouth daily.      [name] atorvastatin (LIPITOR) 40 MG tablet Take 1 tablet (40 mg total) by mouth daily.  90 tablet  3  . carvedilol (COREG) 25 MG tablet Take 1 tablet (25 mg total) by mouth 2 (two) times daily.  180 tablet  3  . cetirizine (ZYRTEC) 10 MG tablet Take 10 mg by mouth daily.      . fluticasone (FLONASE) 50 MCG/ACT nasal spray Place 1 spray into both nostrils 2 (two) times daily as needed for allergies or rhinitis.      [name] isosorbide mononitrate (IMDUR) 60 MG 24 hr  tablet Take 1 tablet (60 mg total) by mouth daily.  30 tablet  6  . losartan (COZAAR) 100 MG tablet Take 1 tablet (100 mg total) by mouth daily.  90 tablet  3  . amLODipine (NORVASC) 5 MG tablet Take 1 tablet (5 mg total) by mouth daily.  90 tablet  3  . ticagrelor (BRILINTA) 90 MG TABS tablet Take 90 mg by mouth 2 (two) times daily.       No current facility-administered medications for this visit.    Allergies  Allergen Reactions  . Penicillins Rash    Pt states she has taken since intial  reaction without recurrence.     History reviewed. No pertinent family history.  History   Social History  . Marital Status: Married    Spouse Name: N/A    Number of Children: N/A  . Years of Education: N/A   Occupational History  . Not on file.   Social History Main Topics  . Smoking status: Never Smoker   . Smokeless tobacco: Never Used  . Alcohol Use: No  . Drug Use: No  . Sexual Activity: Yes   Other Topics Concern  . Not on file   Social History Narrative  . No narrative on file  ROS ALL NEGATIVE EXCEPT THOSE NOTED IN HPI  PE  General Appearance: well developed, well nourished in no acute distress, obese HEENT: symmetrical face, PERRLA, good dentition  Neck: no JVD, thyromegaly, or adenopathy, trachea midline Chest: symmetric without deformity Cardiac: PMI non-displaced, RRR, normal S1, S2, no gallop or murmur Lung: clear to ausculation and percussion Vascular: all pulses full without bruits  Extremities: no cyanosis, clubbing or edema, no sign of DVT, no varicosities  Skin: normal color, no rashes Neuro: alert and oriented x 3, non-focal Pysch: normal affect  EKG  Not repeated  BMET    Component Value Date/Time   NA 141 12/14/2013 1040   K 3.7 12/14/2013 1040   CL 108 12/14/2013 1040   CO2 21 12/14/2013 1040   GLUCOSE 101* 12/14/2013 1040   BUN 10 12/14/2013 1040   CREATININE 0.73 12/14/2013 1040   CREATININE 0.69 11/30/2013 1223   CALCIUM 9.2 12/14/2013 1040    GFRNONAA >90 12/14/2013 1040   GFRNONAA >89 11/30/2013 1223   GFRAA >90 12/14/2013 1040   GFRAA >89 11/30/2013 1223    Lipid Panel     Component Value Date/Time   CHOL 191 01/18/2014 0913   TRIG 147 01/18/2014 0913   HDL 42 01/18/2014 0913   CHOLHDL 4.5 01/18/2014 0913   VLDL 29 01/18/2014 0913   LDLCALC 120* 01/18/2014 0913    CBC    Component Value Date/Time   WBC 6.0 12/14/2013 1040   RBC 3.98 12/14/2013 1040   HGB 10.8* 12/14/2013 1040   HCT 33.3* 12/14/2013 1040   PLT 242 12/14/2013 1040   MCV 83.7 12/14/2013 1040   MCH 27.1 12/14/2013 1040   MCHC 32.4 12/14/2013 1040   RDW 13.9 12/14/2013 1040   LYMPHSABS 2.5 12/14/2013 1040   MONOABS 0.4 12/14/2013 1040   EOSABS 0.3 12/14/2013 1040   BASOSABS 0.1 12/14/2013 1040

## 2014-02-21 NOTE — Assessment & Plan Note (Signed)
She is status post hysterectomy so this is unlikely source of anemia

## 2014-02-21 NOTE — Progress Notes (Signed)
Pt here to see Dr. Verl Blalock  With c/o increased sob with activity and dizziness One episode of slight mid sternal chest pain with radiating pain Denies swelling or feeling faint

## 2014-02-21 NOTE — Assessment & Plan Note (Signed)
Will followup fasting lipids and LFTs on atorvastatin 40 mg in October. LDL goal less than 100.

## 2014-02-21 NOTE — Assessment & Plan Note (Signed)
We'll draw an anemia profile as well as recheck CBC. Schedule visit with Chari Manning in several weeks.

## 2014-02-21 NOTE — Assessment & Plan Note (Signed)
Her blood pressure is up suboptimal control. I suspect this has some to do with her lack of stamina and shortness of breath and dyspnea on exertion. I've added amlodipine 5 mg each morning. She'll continue on isosorbide, carvedilol, and losartan. Schedule blood pressure followup with nurse and Alvina Chou. in a couple weeks.

## 2014-03-08 ENCOUNTER — Ambulatory Visit: Payer: Self-pay | Attending: Internal Medicine

## 2014-03-08 NOTE — Progress Notes (Unsigned)
Patient ID: Christine Barrett, female   DOB: [birth year], 53 y.o.   MRN: [id number] Pt comes in for blood pressure recheck with med management Pt is compliant with taking medication  BP- 135/84 66 Denies headaches,blurry vision or dizziness

## 2014-04-24 ENCOUNTER — Other Ambulatory Visit: Payer: Self-pay

## 2014-06-07 ENCOUNTER — Encounter (HOSPITAL_COMMUNITY): Payer: Self-pay | Admitting: Cardiovascular Disease

## 2014-07-23 ENCOUNTER — Encounter: Payer: Self-pay | Admitting: Family Medicine

## 2014-07-25 ENCOUNTER — Ambulatory Visit: Payer: Self-pay

## 2014-07-26 ENCOUNTER — Encounter (HOSPITAL_COMMUNITY): Payer: Self-pay | Admitting: Emergency Medicine

## 2014-07-26 ENCOUNTER — Emergency Department (HOSPITAL_COMMUNITY): Payer: 59

## 2014-07-26 ENCOUNTER — Emergency Department (HOSPITAL_COMMUNITY)
Admission: EM | Admit: 2014-07-26 | Discharge: 2014-07-27 | Disposition: A | Discharge status: Home / Self Care | Payer: 59 | Attending: Emergency Medicine | Admitting: Emergency Medicine

## 2014-07-26 DIAGNOSIS — Z8709 Personal history of other diseases of the respiratory system: Secondary | ICD-10-CM | POA: Diagnosis not present

## 2014-07-26 DIAGNOSIS — Z880 Allergy status to penicillin: Secondary | ICD-10-CM | POA: Diagnosis not present

## 2014-07-26 DIAGNOSIS — Z79899 Other long term (current) drug therapy: Secondary | ICD-10-CM | POA: Diagnosis not present

## 2014-07-26 DIAGNOSIS — I252 Old myocardial infarction: Secondary | ICD-10-CM | POA: Diagnosis not present

## 2014-07-26 DIAGNOSIS — Z9861 Coronary angioplasty status: Secondary | ICD-10-CM | POA: Diagnosis not present

## 2014-07-26 DIAGNOSIS — I2510 Atherosclerotic heart disease of native coronary artery without angina pectoris: Secondary | ICD-10-CM | POA: Insufficient documentation

## 2014-07-26 DIAGNOSIS — I10 Essential (primary) hypertension: Secondary | ICD-10-CM | POA: Insufficient documentation

## 2014-07-26 DIAGNOSIS — Z7982 Long term (current) use of aspirin: Secondary | ICD-10-CM | POA: Insufficient documentation

## 2014-07-26 DIAGNOSIS — R079 Chest pain, unspecified: Secondary | ICD-10-CM | POA: Diagnosis present

## 2014-07-26 DIAGNOSIS — K219 Gastro-esophageal reflux disease without esophagitis: Secondary | ICD-10-CM | POA: Insufficient documentation

## 2014-07-26 DIAGNOSIS — E785 Hyperlipidemia, unspecified: Secondary | ICD-10-CM | POA: Diagnosis not present

## 2014-07-26 LAB — BASIC METABOLIC PANEL
ANION GAP: 9 (ref 5–15)
BUN: 11 mg/dL (ref 6–23)
CO2: 21 mmol/L (ref 19–32)
Calcium: 9.1 mg/dL (ref 8.4–10.5)
Chloride: 105 mmol/L (ref 96–112)
Creatinine, Ser: 0.8 mg/dL (ref 0.50–1.10)
GFR calc Af Amer: >90 mL/min (ref >90)
GFR, EST NON AFRICAN AMERICAN: 83 mL/min — AB (ref >90)
Glucose, Bld: 149 mg/dL — ABNORMAL HIGH (ref 70–99)
Potassium: 3.6 mmol/L (ref 3.5–5.1)
Sodium: 135 mmol/L (ref 135–145)

## 2014-07-26 LAB — I-STAT TROPONIN, ED: Troponin i, poc: 0.01 ng/mL (ref 0.00–0.08)

## 2014-07-26 LAB — CBC
HCT: 35.7 % — ABNORMAL LOW (ref 36.0–46.0)
HEMOGLOBIN: 11.6 g/dL — AB (ref 12.0–15.0)
MCH: 27.4 pg (ref 26.0–34.0)
MCHC: 32.5 g/dL (ref 30.0–36.0)
MCV: 84.2 fL (ref 78.0–100.0)
Platelets: 241 10*3/uL (ref 150–400)
RBC: 4.24 MIL/uL (ref 3.87–5.11)
RDW: 13.7 % (ref 11.5–15.5)
WBC: 7.2 10*3/uL (ref 4.0–10.5)

## 2014-07-26 LAB — BRAIN NATRIURETIC PEPTIDE: B NATRIURETIC PEPTIDE 5: 16.8 pg/mL (ref 0.0–100.0)

## 2014-07-26 MED ORDER — GI COCKTAIL ~~LOC~~
30.0000 mL | Freq: Once | ORAL | Status: AC
  Administered 2014-07-26: 30 mL via ORAL
  Filled 2014-07-26: qty 30

## 2014-07-26 NOTE — ED Provider Notes (Signed)
CSN: [id number]     Arrival date & time 07/26/14  2156 History   First MD Initiated Contact with Patient 07/26/14 2241     Chief Complaint  Patient presents with  . Chest Pain   Christine Barrett is a 54 y.o. female with a history of hypertension, hyperlipidemia, and an NSTEMI who presents the emergency department complaining of intermittent substernal chest pain since Monday. Patient reports she's had substernal chest pain on and off since Monday associated with some increased belching and what she describes as heartburn. Patient reports her chest pain returned tonight at 8 PM. She describes her pain currently is substernal and rates it at a 3 out of 10. She reports this pain is worse with deep inspiration. She reports this pain started while she was sitting down. The pain is not worse with exertion. She also complains of some right arm tingling. She reports some slight shortness of breath. Patient also reports having some pain when swallowing over the past several weeks. The patient denies fevers, chills, cough, wheezing, trouble swallowing, numbness, dysuria, hematuria, abdominal pain, nausea or vomiting.  (Consider location/radiation/quality/duration/timing/severity/associated sxs/prior Treatment) HPI  Past Medical History  Diagnosis Date  . Hypertension   . Hyperlipemia   . Seasonal allergies   . Sinusitis   . CAD S/P percutaneous coronary angioplasty     Ostial AV G Cx - 2.5 mm Angiosculpt; mid LAD 40-60%  . Non-ST elevation (NSTEMI) myocardial infarction 10/01/2013    Due to Accelerated HTN with existing CAD  . Chronic bronchitis     "frequently; not q yr" (10/03/2013)  . Migraine     "@ least once/month" (10/03/2013)   Past Surgical History  Procedure Laterality Date  . Cesarean section  1989  . Nasal septoplasty w/ turbinoplasty  ~ 2007  . Coronary angioplasty  10/03/2013    95% ostial AV G Cx - 2.5 mm AngioSculpt Balloon PTCA; mid LAD 40-60%  . Tubal ligation  1989  . Abdominal  hysterectomy  1994    "partial"  . Left heart catheterization with coronary angiogram N/A 10/02/2013    Procedure: LEFT HEART CATHETERIZATION WITH CORONARY ANGIOGRAM;  Surgeon: [name], MD;  Location: [facility] CATH LAB;  Service: Cardiovascular;  Laterality: N/A;  . Percutaneous coronary stent intervention (pci-s) N/A 10/03/2013    Procedure: PERCUTANEOUS CORONARY STENT INTERVENTION (PCI-S);  Surgeon: [name], MD;  Location: [facility] CATH LAB;  Service: Cardiovascular;  Laterality: N/A;   No family history on file. History  Substance Use Topics  . Smoking status: Never Smoker   . Smokeless tobacco: Never Used  . Alcohol Use: No   OB History    No data available     Review of Systems  Constitutional: Negative for fever and chills.  HENT: Negative for congestion, ear pain, sore throat and trouble swallowing.   Eyes: Negative for pain and visual disturbance.  Respiratory: Positive for shortness of breath. Negative for cough and wheezing.   Cardiovascular: Positive for chest pain. Negative for palpitations and leg swelling.  Gastrointestinal: Negative for nausea, vomiting, abdominal pain and diarrhea.  Genitourinary: Negative for dysuria and hematuria.  Musculoskeletal: Negative for back pain and neck pain.  Skin: Negative for rash.  Neurological: Negative for dizziness, weakness, light-headedness, numbness and headaches.      Allergies  Penicillins  Home Medications   Prior to Admission medications   Medication Sig Start Date End Date Taking? Authorizing Provider  amLODipine (NORVASC) 5 MG tablet Take 1 tablet (5 mg total)  by mouth daily. 02/21/14  Yes Renella Cunas, MD  aspirin EC 81 MG EC tablet Take 1 tablet (81 mg total) by mouth daily. 10/04/13  Yes Geradine Girt, DO  atorvastatin (LIPITOR) 40 MG tablet Take 1 tablet (40 mg total) by mouth daily. 01/24/14  Yes Renella Cunas, MD  carvedilol (COREG) 25 MG tablet Take 1 tablet (25 mg total) by mouth 2 (two) times daily. 12/27/13   Yes Lance Bosch, NP  isosorbide mononitrate (IMDUR) 60 MG 24 hr tablet Take 1 tablet (60 mg total) by mouth daily. 11/30/13  Yes Tresa Garter, MD  losartan (COZAAR) 100 MG tablet Take 1 tablet (100 mg total) by mouth daily. 10/18/13  Yes Renella Cunas, MD  cetirizine (ZYRTEC) 10 MG tablet Take 10 mg by mouth daily.    Historical Provider, MD  fluticasone (FLONASE) 50 MCG/ACT nasal spray Place 1 spray into both nostrils 2 (two) times daily as needed for allergies or rhinitis.    Historical Provider, MD  omeprazole (PRILOSEC) 20 MG capsule Take 1 capsule (20 mg total) by mouth daily. 07/27/14   Verda Cumins Lamark Schue, PA-C  ticagrelor (BRILINTA) 90 MG TABS tablet Take 90 mg by mouth 2 (two) times daily. 10/12/13   [name], MD   BP 140/74 mmHg  Pulse 68  Temp(Src) 98.4 F (36.9 C) (Oral)  Resp 12  Ht 5\' 10"  (1.778 m)  Wt 250 lb (113.399 kg)  BMI 35.87 kg/m2  SpO2 99% Physical Exam  Constitutional: She is oriented to person, place, and time. She appears well-developed and well-nourished. No distress.  HENT:  Head: Normocephalic and atraumatic.  Mouth/Throat: Oropharynx is clear and moist. No oropharyngeal exudate.  Eyes: Conjunctivae are normal. Pupils are equal, round, and reactive to light. Right eye exhibits no discharge. Left eye exhibits no discharge.  Neck: Neck supple.  Cardiovascular: Normal rate, regular rhythm, normal heart sounds and intact distal pulses.  Exam reveals no gallop and no friction rub.   No murmur heard. Bilateral radial pulses are intact. Bilateral posterior tibialis and dorsalis pedis pulses are intact.  Pulmonary/Chest: Effort normal and breath sounds normal. No respiratory distress. She has no wheezes. She has no rales. She exhibits tenderness.  Lungs are clear to auscultation bilaterally. Patient's anterior chest wall is tender to palpation and reproduces her chest pain.  Abdominal: Soft. She exhibits no distension. There is no tenderness.   Musculoskeletal: She exhibits no edema.  No lower extremity edema or tenderness noted. No calf tenderness noted.  Lymphadenopathy:    She has no cervical adenopathy.  Neurological: She is alert and oriented to person, place, and time. Coordination normal.  Skin: Skin is warm and dry. No rash noted. She is not diaphoretic. No erythema. No pallor.  Psychiatric: She has a normal mood and affect. Her behavior is normal.  Nursing note and vitals reviewed.   ED Course  Procedures (including critical care time) Labs Review Labs Reviewed  CBC - Abnormal; Notable for the following:    Hemoglobin 11.6 (*)    HCT 35.7 (*)    All other components within normal limits  BASIC METABOLIC PANEL - Abnormal; Notable for the following:    Glucose, Bld 149 (*)    GFR calc non Af Amer 83 (*)    All other components within normal limits  BRAIN NATRIURETIC PEPTIDE  I-STAT TROPOININ, ED  Randolm Idol, ED    Imaging Review Dg Chest 2 View  07/26/2014   CLINICAL DATA:  Left-sided chest pain since Monday afternoon.  EXAM: CHEST  2 VIEW  COMPARISON:  10/01/2013, 08/27/2010  FINDINGS: There is mild cardiomegaly, unchanged. There is mild unchanged aortic tortuosity. Mild interstitial coarsening is present in the bases, unchanged. There is no evidence of superimposed acute infiltrate or congestive failure. There are no pleural effusions.  IMPRESSION: Mild unchanged cardiomegaly.  No acute findings.   Electronically Signed   By: Andreas Newport M.D.   On: 07/26/2014 22:31     EKG Interpretation   Date/Time:  Thursday July 26 2014 21:58:30 EST Ventricular Rate:  91 PR Interval:  120 QRS Duration: 86 QT Interval:  370 QTC Calculation: 455 R Axis:   43 Text Interpretation:  Sinus rhythm with Premature supraventricular  complexes No significant change since last tracing Confirmed by HARRISON   MD, FORREST ([id number]) on 07/26/2014 10:47:41 PM      Filed Vitals:   07/26/14 2305 07/26/14 2315 07/26/14  2330 07/27/14 0015  BP:  131/73 137/74 140/74  Pulse:  68 72 68  Temp:      TempSrc:      Resp:  17 14 12   Height: 5\' 10"  (1.778 m)     Weight: 250 lb (113.399 kg)     SpO2:  99% 100% 99%     MDM   Meds given in ED:  Medications  gi cocktail (Maalox,Lidocaine,Donnatal) (30 mLs Oral Given 07/26/14 2331)    New Prescriptions   OMEPRAZOLE (PRILOSEC) 20 MG CAPSULE    Take 1 capsule (20 mg total) by mouth daily.    Final diagnoses:  Nonspecific chest pain  Gastroesophageal reflux disease, esophagitis presence not specified   Christine Barrett is a 54 y.o. female with a history of hypertension, hyperlipidemia, and an NSTEMI who presents the emergency department complaining of intermittent substernal chest pain since Monday. Patient's pain returned around 8 pm tonight. Patient also reports symptoms of reflux. Patient's chest pain is reproducible on palpation. Patient's pain improved to a 2 out of 10 after GI cocktail. Initial troponin is negative. CBC and BMP are unremarkable. Chest x-ray shows no acute findings and is unchanged from previous. EKG shows sinus rhythm with premature supraventricular complexes and no significant change from last ECG. Patient does not wish to be admitted for ACS rule out, but agrees to stay for delta troponin. Delta troponin is negative. At reevaluation the patient reports her chest pain is completely resolved. Suspect acid reflux might be contributing to this pain. We'll discharge with omeprazole prescription. Advised patient she needs to [facility]. Strict return precautions provided. I advised the patient to follow-up with their primary care provider this week. I advised the patient to return to the emergency department with new or worsening symptoms or new concerns. The patient verbalized understanding and agreement with plan.    This patient was discussed with and evaluated by Dr. Aline Brochure who agrees with assessment and  plan.     Hanley Hays, PA-C 07/27/14 0147  Pamella Pert, MD 07/28/14 1250

## 2014-07-26 NOTE — ED Notes (Signed)
Pt. reports central chest pain onset this Monday with SOB and dizziness , denies nausea or diaphoresis . No cough or congestion . History of CAD /NSTEMI .

## 2014-07-26 NOTE — ED Notes (Signed)
Will Porfirio Mylar, PA at bedside

## 2014-07-26 NOTE — ED Notes (Signed)
Substernal pain, reproducible with palpation and deep inspiration

## 2014-07-27 LAB — I-STAT TROPONIN, ED: Troponin i, poc: 0 ng/mL (ref 0.00–0.08)

## 2014-07-27 MED ORDER — OMEPRAZOLE 20 MG PO CPDR: 20.0000 mg | DELAYED_RELEASE_CAPSULE | Freq: Every day | ORAL | Status: DC

## 2014-07-27 NOTE — Discharge Instructions (Signed)
Chest Pain (Nonspecific) °It is often hard to give a specific diagnosis for the cause of chest pain. There is always a chance that your pain could be related to something serious, such as a heart attack or a blood clot in the lungs. You need to follow up with your health care provider for further evaluation. °CAUSES  °· Heartburn. °· Pneumonia or bronchitis. °· Anxiety or stress. °· Inflammation around your heart (pericarditis) or lung (pleuritis or pleurisy). °· A blood clot in the lung. °· A collapsed lung (pneumothorax). It can develop suddenly on its own (spontaneous pneumothorax) or from trauma to the chest. °· Shingles infection (herpes zoster virus). °The chest wall is composed of bones, muscles, and cartilage. Any of these can be the source of the pain. °· The bones can be bruised by injury. °· The muscles or cartilage can be strained by coughing or overwork. °· The cartilage can be affected by inflammation and become sore (costochondritis). °DIAGNOSIS  °Lab tests or other studies may be needed to find the cause of your pain. Your health care provider may have you take a test called an ambulatory electrocardiogram (ECG). An ECG records your heartbeat patterns over a 24-hour period. You may also have other tests, such as: °· Transthoracic echocardiogram (TTE). During echocardiography, sound waves are used to evaluate how blood flows through your heart. °· Transesophageal echocardiogram (TEE). °· Cardiac monitoring. This allows your health care provider to monitor your heart rate and rhythm in real time. °· Holter monitor. This is a portable device that records your heartbeat and can help diagnose heart arrhythmias. It allows your health care provider to track your heart activity for several days, if needed. °· Stress tests by exercise or by giving medicine that makes the heart beat faster. °TREATMENT  °· Treatment depends on what may be causing your chest pain. Treatment may include: °· Acid blockers for  heartburn. °· Anti-inflammatory medicine. °· Pain medicine for inflammatory conditions. °· Antibiotics if an infection is present. °· You may be advised to change lifestyle habits. This includes stopping smoking and avoiding alcohol, caffeine, and chocolate. °· You may be advised to keep your head raised (elevated) when sleeping. This reduces the chance of acid going backward from your stomach into your esophagus. °Most of the time, nonspecific chest pain will improve within 2-3 days with rest and mild pain medicine.  °HOME CARE INSTRUCTIONS  °· If antibiotics were prescribed, take them as directed. Finish them even if you start to feel better. °· For the next few days, avoid physical activities that bring on chest pain. Continue physical activities as directed. °· Do not use any tobacco products, including cigarettes, chewing tobacco, or electronic cigarettes. °· Avoid drinking alcohol. °· Only take medicine as directed by your health care provider. °· Follow your health care provider's suggestions for further testing if your chest pain does not go away. °· Keep any follow-up appointments you made. If you do not go to an appointment, you could develop lasting (chronic) problems with pain. If there is any problem keeping an appointment, call to reschedule. °SEEK MEDICAL CARE IF:  °· Your chest pain does not go away, even after treatment. °· You have a rash with blisters on your chest. °· You have a fever. °SEEK IMMEDIATE MEDICAL CARE IF:  °· You have increased chest pain or pain that spreads to your arm, neck, jaw, back, or abdomen. °· You have shortness of breath. °· You have an increasing cough, or you cough   up blood. °· You have severe back or abdominal pain. °· You feel nauseous or vomit. °· You have severe weakness. °· You faint. °· You have chills. °This is an emergency. Do not wait to see if the pain will go away. Get medical help at once. Call your local emergency services (911 in [place] Do not drive  yourself to the hospital. °MAKE SURE YOU:  °· Understand these instructions. °· Will watch your condition. °· Will get help right away if you are not doing well or get worse. °Document Released: 03/25/2005 Document Revised: 06/20/2013 Document Reviewed: 01/19/2008 °ExitCare® Patient Information ©2015 [employer]. This information is not intended to replace advice given to you by your health care provider. Make sure you discuss any questions you have with your health care provider. °Gastroesophageal Reflux Disease, Adult °Gastroesophageal reflux disease (GERD) happens when acid from your stomach flows up into the esophagus. When acid comes in contact with the esophagus, the acid causes soreness (inflammation) in the esophagus. Over time, GERD may create small holes (ulcers) in the lining of the esophagus. °CAUSES  °· Increased body weight. This puts pressure on the stomach, making acid rise from the stomach into the esophagus. °· Smoking. This increases acid production in the stomach. °· Drinking alcohol. This causes decreased pressure in the lower esophageal sphincter (valve or ring of muscle between the esophagus and stomach), allowing acid from the stomach into the esophagus. °· Late evening meals and a full stomach. This increases pressure and acid production in the stomach. °· A malformed lower esophageal sphincter. °Sometimes, no cause is found. °SYMPTOMS  °· Burning pain in the lower part of the mid-chest behind the breastbone and in the mid-stomach area. This may occur twice a week or more often. °· Trouble swallowing. °· Sore throat. °· Dry cough. °· Asthma-like symptoms including chest tightness, shortness of breath, or wheezing. °DIAGNOSIS  °Your caregiver may be able to diagnose GERD based on your symptoms. In some cases, X-rays and other tests may be done to check for complications or to check the condition of your stomach and esophagus. °TREATMENT  °Your caregiver may recommend over-the-counter or  prescription medicines to help decrease acid production. Ask your caregiver before starting or adding any new medicines.  °HOME CARE INSTRUCTIONS  °· Change the factors that you can control. Ask your caregiver for guidance concerning weight loss, quitting smoking, and alcohol consumption. °· Avoid foods and drinks that make your symptoms worse, such as: °¨ Caffeine or alcoholic drinks. °¨ Chocolate. °¨ Peppermint or mint flavorings. °¨ Garlic and onions. °¨ Spicy foods. °¨ Citrus fruits, such as oranges, lemons, or limes. °¨ Tomato-based foods such as sauce, chili, salsa, and pizza. °¨ Fried and fatty foods. °· Avoid lying down for the 3 hours prior to your bedtime or prior to taking a nap. °· Eat small, frequent meals instead of large meals. °· Wear loose-fitting clothing. Do not wear anything tight around your waist that causes pressure on your stomach. °· Raise the head of your bed 6 to 8 inches with wood blocks to help you sleep. Extra pillows will not help. °· Only take over-the-counter or prescription medicines for pain, discomfort, or fever as directed by your caregiver. °· Do not take aspirin, ibuprofen, or other nonsteroidal anti-inflammatory drugs (NSAIDs). °SEEK IMMEDIATE MEDICAL CARE IF:  °· You have pain in your arms, neck, jaw, teeth, or back. °· Your pain increases or changes in intensity or duration. °· You develop nausea, vomiting, or sweating (diaphoresis). °·   You develop shortness of breath, or you faint. °· Your vomit is green, yellow, black, or looks like coffee grounds or blood. °· Your stool is red, bloody, or black. °These symptoms could be signs of other problems, such as heart disease, gastric bleeding, or esophageal bleeding. °MAKE SURE YOU:  °· Understand these instructions. °· Will watch your condition. °· Will get help right away if you are not doing well or get worse. °Document Released: 03/25/2005 Document Revised: 09/07/2011 Document Reviewed: 01/02/2011 °ExitCare® Patient  Information ©2015 [employer]. This information is not intended to replace advice given to you by your health care provider. Make sure you discuss any questions you have with your health care provider. ° °

## 2014-07-30 ENCOUNTER — Telehealth: Payer: Self-pay | Admitting: Internal Medicine

## 2014-07-30 NOTE — Telephone Encounter (Signed)
Okay to move her new pt appt sooner in a  30 min slot

## 2014-07-30 NOTE — Telephone Encounter (Signed)
Pt has new patient appointment 12/27/14 with Dr [name] She went to [facility] 1/28 for chest pain   They told her was acid reflux. 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

## 2014-07-30 NOTE — Telephone Encounter (Signed)
Appointment 2/12 pt aware

## 2014-08-06 ENCOUNTER — Other Ambulatory Visit: Payer: Self-pay | Admitting: Internal Medicine

## 2014-08-10 ENCOUNTER — Encounter: Payer: Self-pay | Admitting: Family Medicine

## 2014-08-10 ENCOUNTER — Ambulatory Visit (INDEPENDENT_AMBULATORY_CARE_PROVIDER_SITE_OTHER): Payer: 59 | Admitting: Family Medicine

## 2014-08-10 VITALS — BP 140/90 | HR 70 | Temp 97.6°F | Ht 69.0 in | Wt 272.2 lb

## 2014-08-10 DIAGNOSIS — N951 Menopausal and female climacteric states: Secondary | ICD-10-CM | POA: Insufficient documentation

## 2014-08-10 DIAGNOSIS — E785 Hyperlipidemia, unspecified: Secondary | ICD-10-CM

## 2014-08-10 DIAGNOSIS — R1314 Dysphagia, pharyngoesophageal phase: Secondary | ICD-10-CM

## 2014-08-10 DIAGNOSIS — I119 Hypertensive heart disease without heart failure: Secondary | ICD-10-CM

## 2014-08-10 DIAGNOSIS — R1310 Dysphagia, unspecified: Secondary | ICD-10-CM | POA: Insufficient documentation

## 2014-08-10 DIAGNOSIS — D649 Anemia, unspecified: Secondary | ICD-10-CM

## 2014-08-10 DIAGNOSIS — K219 Gastro-esophageal reflux disease without esophagitis: Secondary | ICD-10-CM

## 2014-08-10 DIAGNOSIS — J309 Allergic rhinitis, unspecified: Secondary | ICD-10-CM

## 2014-08-10 NOTE — Assessment & Plan Note (Signed)
Stable

## 2014-08-10 NOTE — Assessment & Plan Note (Signed)
Use flonase and nasal irrigation.

## 2014-08-10 NOTE — Progress Notes (Signed)
Pre visit review using our clinic review tool, if applicable. No additional management support is needed unless otherwise documented below in the visit note. 

## 2014-08-10 NOTE — Progress Notes (Signed)
Subjective:    Patient ID: Christine Barrett, female    DOB: Jul 23, 1960, 54 y.o.   MRN: [id number]  HPI 54 year old female presents to establish care. She as recently been seen in ER for chest pain on 07/26/2014. [facility] Dr. Feliciana Rossetti  in last year.  Last CPX 12/28/2103.  In ER  She had negative workup ( Patient's pain improved  Initially to a 2 out of 10 after GI cocktail. Initial and delta troponin were negative. CBC and BMP: unremarkable. Chest x-ray showed no acute findings and is unchanged from previous. EKG showed sinus rhythm with premature supraventricular complexes and no significant change from last ECG.  At reevaluation the patient reported her chest pain was completely resolved.) ED physician felt  likely due to MSK source vs. Reflux.  She has follow up with cardiology on  2/15 with Dr. Dorene Ar.   She was previously followed by Dr. Verl Blalock, hx of CAD with angioplasty last seen 02/21/2014 for hypertensive urgency. She is on 4 med regimen with amlodipine, isosorbide, carvedilol ans losartan.  BP Readings from Last 3 Encounters:  08/10/14 140/90  07/27/14 120/69  03/08/14 135/84   Elevated Cholesterol:. Due for re-eval on simvastatin 40 mg daily, changed from atorvastatin 40 7 months.. Goal LDL < 70 Lab Results  Component Value Date   CHOL 191 01/18/2014   HDL 42 01/18/2014   LDLCALC 120* 01/18/2014   LDLDIRECT 192.2 02/11/2009   TRIG 147 01/18/2014   CHOLHDL 4.5 01/18/2014  Using medications without problems: None Muscle aches: None Diet compliance:Moderate, room for improvement. Exercise: None Other complaints:    Anemia, normocytic, mild. Stable at last check in ER. Appears to be due to chronic disease. Nml iron levels, nml ferritin, low TIBC.  In last 6 months she has noted food getting stuck in chest after she eats, frequent belching.  Has to drink to get food down.  She notes symptoms worse after onions raw,    Review of Systems  All other  systems reviewed and are negative.      Objective:   Physical Exam  Constitutional: Vital signs are normal. She appears well-developed and well-nourished. She is cooperative.  Non-toxic appearance. She does not appear ill. No distress.  HENT:  Head: Normocephalic.  Right Ear: Hearing, tympanic membrane, external ear and ear canal normal.  Left Ear: Hearing, tympanic membrane, external ear and ear canal normal.  Nose: Nose normal.  Eyes: Conjunctivae, EOM and lids are normal. Pupils are equal, round, and reactive to light. Lids are everted and swept, no foreign bodies found.  Neck: Trachea normal and normal range of motion. Neck supple. Carotid bruit is not present. No thyroid mass and no thyromegaly present.  Cardiovascular: Normal rate, regular rhythm, S1 normal, S2 normal, normal heart sounds and intact distal pulses.  Exam reveals no gallop.   No murmur heard. Pulmonary/Chest: Effort normal and breath sounds normal. No respiratory distress. She has no wheezes. She has no rhonchi. She has no rales.  Abdominal: Soft. Normal appearance and bowel sounds are normal. She exhibits no distension, no fluid wave, no abdominal bruit and no mass. There is no hepatosplenomegaly. There is no tenderness. There is no rebound, no guarding and no CVA tenderness. No hernia.  Lymphadenopathy:    She has no cervical adenopathy.    She has no axillary adenopathy.  Neurological: She is alert. She has normal strength. No cranial nerve deficit or sensory deficit.  Skin: Skin is  warm, dry and intact. No rash noted.  Psychiatric: Her speech is normal and behavior is normal. Judgment normal. Her mood appears not anxious. Cognition and memory are normal. She does not exhibit a depressed mood.          Assessment & Plan:

## 2014-08-10 NOTE — Assessment & Plan Note (Signed)
Well controlled on current 4 med regimen.

## 2014-08-10 NOTE — Patient Instructions (Addendum)
Schedule CPX with labs prior in July. Return for fasting labs next week. Work on [employer] and regular exercise. Increase prilosec to 2 tabs of 20 mg daily.  Avoid triggers ( onions, caffeine, soda, alcohol, chocolate, citris, tomato, spicy)  Work on small frequent meals.  Elevate head of bed, use pillows. Eat earlier meal in day. Call if reflux, difficult swallowing is not improved in 2 weeks.

## 2014-08-10 NOTE — Assessment & Plan Note (Signed)
She has been suing prilosec 20 mg daily x 1/28. Helps a little. Increase prilosec to 2 tabs of 20 mg daily.  Avoid triggers.

## 2014-08-13 ENCOUNTER — Encounter (HOSPITAL_COMMUNITY): Payer: Self-pay | Admitting: *Deleted

## 2014-08-13 ENCOUNTER — Ambulatory Visit (INDEPENDENT_AMBULATORY_CARE_PROVIDER_SITE_OTHER): Payer: 59 | Admitting: Cardiology

## 2014-08-13 ENCOUNTER — Encounter: Payer: Self-pay | Admitting: Cardiology

## 2014-08-13 VITALS — BP 142/68 | HR 64 | Ht 69.0 in | Wt 272.9 lb

## 2014-08-13 DIAGNOSIS — R079 Chest pain, unspecified: Secondary | ICD-10-CM

## 2014-08-13 MED ORDER — ATORVASTATIN CALCIUM 40 MG PO TABS: 40.0000 mg | ORAL_TABLET | Freq: Every day | ORAL | Status: DC

## 2014-08-13 NOTE — Patient Instructions (Signed)
Your physician recommends that you schedule a follow-up appointment with Dr.Harding at his first available after your test.  Your physician has requested that you have en exercise stress myoview. For further information please visit [email or web address]. Please follow instruction sheet, as given.

## 2014-08-13 NOTE — Progress Notes (Signed)
Cardiology Office Note   Date:  08/13/2014   ID:  Christine Barrett, DOB 09/28/1960, MRN [id number]  PCP:  Chari Manning, NP  Cardiologist:  Dr. Ellyn Hack.    Chief Complaint  Patient presents with  . Chest Pain    ED 07/26/14, ED follow up,  no chest since.       History of Present Illness:  Christine Barrett is a 54 y.o. female who presents for chest pain that she had been seen for in [facility] ER, 07/26/14.    She has hx on NSTEMI 09/2014 with initial cath of multivessel disease with 60-70% ostial to Mid LAD stenosis, 95% ostial stenosis of an AV groove circumflex which arose from a ramus intermediate like vessel; and 30% proximal right coronary artery stenosis. She underwent successful Scoring Balloon PTCA of the Ostial AV Groove Circumflex with a 2.5 mm AngioSculpt balloon reducing the 95% stenosis to < 30%.  She has been on Brilinta until this month.  Post PCI, DAPT for 3 mos to 1 year and could be converted to Plavix before then if needed.   With recent ER visit for chest pain, it was felt to be GI.  She had substernal chest pain and reflux.  With GI cocktail symptoms resolved.  She was placed on omeprazole. Her Brilinta was discontinued.    No further pain that took her to ER.  She does have chest pain with meals as if the food is stuck in her esophagus and she drinks water to get the food down.  Her omeprazole has been increased to twice a day.  Her PCP is following.    After last lipid panel, the zocor changed to lipitor.  PCP has ordered follow up.      Past Medical History  Diagnosis Date  . Hypertension   . Hyperlipemia   . Seasonal allergies   . Sinusitis   . CAD S/P percutaneous coronary angioplasty     Ostial AV G Cx - 2.5 mm Angiosculpt; mid LAD 40-60%  . Non-ST elevation (NSTEMI) myocardial infarction 10/01/2013    Due to Accelerated HTN with existing CAD  . Chronic bronchitis     "frequently; not q yr" (10/03/2013)  . Migraine     "@ least once/month" (10/03/2013)    Past  Surgical History  Procedure Laterality Date  . Cesarean section  1989  . Nasal septoplasty w/ turbinoplasty  ~ 2007  . Coronary angioplasty  10/03/2013    95% ostial AV G Cx - 2.5 mm AngioSculpt Balloon PTCA; mid LAD 40-60%  . Tubal ligation  1989  . Abdominal hysterectomy  1994    "partial"  . Left heart catheterization with coronary angiogram N/A 10/02/2013    Procedure: LEFT HEART CATHETERIZATION WITH CORONARY ANGIOGRAM;  Surgeon: [name], MD;  Location: [facility] CATH LAB;  Service: Cardiovascular;  Laterality: N/A;  . Percutaneous coronary stent intervention (pci-s) N/A 10/03/2013    Procedure: PERCUTANEOUS CORONARY STENT INTERVENTION (PCI-S);  Surgeon: [name], MD;  Location: [facility] CATH LAB;  Service: Cardiovascular;  Laterality: N/A;     Current Outpatient Prescriptions  Medication Sig Dispense Refill  . amLODipine (NORVASC) 5 MG tablet Take 1 tablet (5 mg total) by mouth daily. 90 tablet 3  . aspirin EC 81 MG EC tablet Take 1 tablet (81 mg total) by mouth daily.    . carvedilol (COREG) 25 MG tablet Take 1 tablet (25 mg total) by mouth 2 (two) times daily. 180 tablet 3  . cetirizine (  ZYRTEC) 10 MG tablet Take 10 mg by mouth daily.    . fluticasone (FLONASE) 50 MCG/ACT nasal spray Place 1 spray into both nostrils 2 (two) times daily as needed for allergies or rhinitis.    [name] isosorbide mononitrate (IMDUR) 60 MG 24 hr tablet TAKE 1 TABLET BY MOUTH DAILY. 30 tablet 6  . losartan (COZAAR) 100 MG tablet Take 1 tablet (100 mg total) by mouth daily. 90 tablet 3  . omeprazole (PRILOSEC) 20 MG capsule Take 1 capsule (20 mg total) by mouth daily. (Patient taking differently: Take 20 mg by mouth 2 (two) times daily before a meal. ) 14 capsule 0  . atorvastatin (LIPITOR) 40 MG tablet Take 1 tablet (40 mg total) by mouth daily. 90 tablet 3   No current facility-administered medications for this visit.    Allergies:   Penicillins    Social History:  The patient  reports that she has never  smoked. She has never used smokeless tobacco. She reports that she does not drink alcohol or use illicit drugs.   Family History:  The patient's family history includes Breast cancer in her sister; Diabetes in her mother.    ROS:  General:no colds or fevers, + weight increase from hospital though all different scales. Skin:no rashes or ulcers HEENT:no blurred vision, no congestion CV:see HPI PUL:see HPI GI:no diarrhea constipation or melena, ++ indigestion- food seems to become stuck in esophagus. GU:no hematuria, no dysuria MS:no joint pain, no claudication Neuro:no syncope, no lightheadedness Endo:no diabetes, no thyroid disease   Wt Readings from Last 3 Encounters:  08/13/14 272 lb 14.4 oz (123.787 kg)  08/10/14 272 lb 4 oz (123.492 kg)  07/26/14 250 lb (113.399 kg)     PHYSICAL EXAM: VS:  BP 142/68 mmHg  Pulse 64  Ht 5\' 9"  (1.753 m)  Wt 272 lb 14.4 oz (123.787 kg)  BMI 40.28 kg/m2 , BMI Body mass index is 40.28 kg/(m^2).  General:Pleasant affect, NAD Skin:Warm and dry, brisk capillary refill HEENT:normocephalic, sclera clear, mucus membranes moist Neck:supple, no JVD, no bruits  Heart:S1S2 RRR without murmur, gallup, rub or click Lungs:clear without rales, rhonchi, or wheezes MWN:UUVO, non tender, + BS, do not palpate liver spleen or masses Ext:no lower ext edema, 2+ pedal pulses, 2+ radial pulses Neuro:alert and oriented, MAE, follows commands, + facial symmetry   EKG:  EKG is ordered today. The ekg ordered today demonstrates SR no acute changes from 07/26/14- non specific T wave changes.       Recent Labs: 10/01/2013: Pro B Natriuretic peptide (BNP) 98.9; TSH 2.620 01/18/2014: ALT 12 07/26/2014: B Natriuretic Peptide 16.8; BUN 11; Creatinine 0.80; Hemoglobin 11.6*; Platelets 241; Potassium 3.6; Sodium 135    Lipid Panel    Component Value Date/Time   CHOL 191 01/18/2014 0913   TRIG 147 01/18/2014 0913   HDL 42 01/18/2014 0913   CHOLHDL 4.5 01/18/2014 0913    VLDL 29 01/18/2014 0913   LDLCALC 120* 01/18/2014 0913   LDLDIRECT 192.2 02/11/2009 1439       Other studies Reviewed: Additional studies/ records that were reviewed today include: previous cardiac cath and PCI.  Office notes and ER note.   ASSESSMENT AND PLAN:  1.  Chest pain, does sound GI but also similar to previous MI.  With her residual CAD will do exercise myoview to further evaluate and she will follow up with Dr. Ellyn Hack.    2. HTN controlled- continue meds.she has not had all her meds today.  She has to  eat first or becomes dizzy.   3.  Hyperlipidemia with goal of LDL <70. Most recent LDL 120 and she was changed to lipitor. PCP following and recheck has been ordered.  4. Reflux followed by PCP.   Current medicines are reviewed at length with the patient today.  The patient understands her meds none were changed.  The following changes have been made:  See above.  Labs/ tests ordered today include:see above  Disposition:   FU:  see above  Lennie Muckle, NP  08/13/2014 11:32 AM    [facility] [place], [place], [name] Phone: [phone]; Fax: [phone]

## 2014-08-17 ENCOUNTER — Telehealth (HOSPITAL_COMMUNITY): Payer: Self-pay

## 2014-08-17 NOTE — Telephone Encounter (Signed)
Encounter complete. 

## 2014-08-20 ENCOUNTER — Telehealth: Payer: Self-pay | Admitting: Family Medicine

## 2014-08-20 ENCOUNTER — Other Ambulatory Visit (INDEPENDENT_AMBULATORY_CARE_PROVIDER_SITE_OTHER): Payer: 59

## 2014-08-20 DIAGNOSIS — Z1211 Encounter for screening for malignant neoplasm of colon: Secondary | ICD-10-CM

## 2014-08-20 DIAGNOSIS — E785 Hyperlipidemia, unspecified: Secondary | ICD-10-CM

## 2014-08-20 LAB — LIPID PANEL
Cholesterol: 191 mg/dL (ref 0–200)
HDL: 45.7 mg/dL (ref >39.00)
LDL Cholesterol: 125 mg/dL — ABNORMAL HIGH (ref 0–99)
NonHDL: 145.3
Total CHOL/HDL Ratio: 4
Triglycerides: 100 mg/dL (ref 0.0–149.0)
VLDL: 20 mg/dL (ref 0.0–40.0)

## 2014-08-20 LAB — COMPREHENSIVE METABOLIC PANEL
ALT: 13 U/L (ref 0–35)
AST: 12 U/L (ref 0–37)
Albumin: 3.8 g/dL (ref 3.5–5.2)
Alkaline Phosphatase: 59 U/L (ref 39–117)
BILIRUBIN TOTAL: 0.7 mg/dL (ref 0.2–1.2)
BUN: 12 mg/dL (ref 6–23)
CO2: 27 meq/L (ref 19–32)
Calcium: 9.1 mg/dL (ref 8.4–10.5)
Chloride: 107 mEq/L (ref 96–112)
Creatinine, Ser: 0.67 mg/dL (ref 0.40–1.20)
GFR: 118.48 mL/min (ref >60.00)
GLUCOSE: 112 mg/dL — AB (ref 70–99)
POTASSIUM: 3.8 meq/L (ref 3.5–5.1)
Sodium: 139 mEq/L (ref 135–145)
Total Protein: 6.8 g/dL (ref 6.0–8.3)

## 2014-08-20 NOTE — Telephone Encounter (Signed)
Let pt know referral sent. 

## 2014-08-20 NOTE — Telephone Encounter (Signed)
pt came in today for labs and wanted to get a colonoscopy she is 3 years behind on getting this. She would like to go to  [facility].  She is not having any problems

## 2014-08-21 NOTE — Telephone Encounter (Signed)
Left message for Christine Barrett that referral has been made and she should hear something in the next couple of weeks in regards to that appointment.

## 2014-08-22 ENCOUNTER — Ambulatory Visit (HOSPITAL_COMMUNITY)
Admission: RE | Admit: 2014-08-22 | Discharge: 2014-08-22 | Disposition: A | Discharge status: Home / Self Care | Payer: 59 | Source: Ambulatory Visit | Attending: Cardiology | Admitting: Cardiology

## 2014-08-22 DIAGNOSIS — R5383 Other fatigue: Secondary | ICD-10-CM | POA: Insufficient documentation

## 2014-08-22 DIAGNOSIS — R42 Dizziness and giddiness: Secondary | ICD-10-CM | POA: Insufficient documentation

## 2014-08-22 DIAGNOSIS — I10 Essential (primary) hypertension: Secondary | ICD-10-CM | POA: Diagnosis not present

## 2014-08-22 DIAGNOSIS — E785 Hyperlipidemia, unspecified: Secondary | ICD-10-CM | POA: Insufficient documentation

## 2014-08-22 DIAGNOSIS — E669 Obesity, unspecified: Secondary | ICD-10-CM | POA: Insufficient documentation

## 2014-08-22 DIAGNOSIS — R079 Chest pain, unspecified: Secondary | ICD-10-CM | POA: Insufficient documentation

## 2014-08-22 DIAGNOSIS — R0609 Other forms of dyspnea: Secondary | ICD-10-CM | POA: Insufficient documentation

## 2014-08-22 HISTORY — PX: NM MYOVIEW LTD: HXRAD82

## 2014-08-22 MED ORDER — TECHNETIUM TC 99M SESTAMIBI GENERIC - CARDIOLITE
10.2000 | Freq: Once | INTRAVENOUS | Status: AC | PRN
  Administered 2014-08-22: 10 via INTRAVENOUS

## 2014-08-22 MED ORDER — TECHNETIUM TC 99M SESTAMIBI GENERIC - CARDIOLITE
31.4000 | Freq: Once | INTRAVENOUS | Status: AC | PRN
  Administered 2014-08-22: 31 via INTRAVENOUS

## 2014-08-22 NOTE — Procedures (Addendum)
[place] [address] [place] [address] [phone]  Cardiology Nuclear Med Study  Rukaya Kleinschmidt is a 54 y.o. female     MRN : [id number]     DOB: September 08, 1960  Procedure Date: 08/22/2014  Nuclear Med Background Indication for Stress Test:  Evaluation for Ischemia, [facility] and s/p STENT/PTCA-04/07/ History:  Chronic Bronchitis;Seizures-childhood;MI-10/01/2013;CAD; Cardiac Risk Factors: Hypertension, Lipids and Obesity  Symptoms:  Chest Pain, Dizziness, DOE, Fatigue and Light-Headedness   Nuclear Pre-Procedure Caffeine/Decaff Intake:  8:00pm NPO After: 6:00am   IV Site: R Forearm  IV 0.9% NS with Angio Cath:  22g  Chest Size (in):  n/a IV Started by: Rolene Course, RN  Height: 5\' 9"  (1.753 m)  Cup Size: D  BMI:  Body mass index is 40.15 kg/(m^2). Weight:  272 lb (123.378 kg)   Tech Comments:  n/a    Nuclear Med Study 1 or 2 day study: 1 day  Stress Test Type:  Stress  Order Authorizing Provider:  Glenetta Hew, MD   Resting Radionuclide: Technetium 43m Sestamibi  Resting Radionuclide Dose: 10.2 mCi   Stress Radionuclide:  Technetium 56m Sestamibi  Stress Radionuclide Dose: 31.4 mCi           Stress Protocol Rest HR: 69 Stress HR: 150  Rest BP: 150/82 Stress BP:213/74  Exercise Time (min): 6:00 METS: 7.00   Predicted Max HR: 168 bpm % Max HR: 89.29 bpm Rate Pressure Product: 31950  Dose of Adenosine (mg):  n/a Dose of Lexiscan: n/a mg  Dose of Atropine (mg): n/a Dose of Dobutamine: n/a mcg/kg/min (at max HR)  Stress Test Technologist: Mellody Memos, CCT Nuclear Technologist: Imagene Riches, CNMT   Rest Procedure:  Myocardial perfusion imaging was performed at rest 45 minutes following the intravenous administration of Technetium 76m Sestamibi. Stress Procedure:  The patient performed treadmill exercise using a Bruce  Protocol for 6 minutes. The patient stopped due to exertional shortness of breath and  fatigue. Patient also experienced dizziness immediately during recovery and denied any chest pain.  There were no significant ST-T wave changes.  Technetium 61m Sestamibi was injected IV at peak exercise and myocardial perfusion imaging was performed after a brief delay.  Transient Ischemic Dilatation (Normal <1.22):  1.30 QGS EDV:  122 ml QGS ESV:  53 ml LV Ejection Fraction: 56%    Rest ECG: NSR with non-specific ST-T wave changes  Stress ECG: No significant ST segment change suggestive of ischemia.  QPS Raw Data Images:  Acquisition technically good; LVE. Stress Images:  There is decreased uptake in the inferior wall. Rest Images:  There is decreased uptake in the inferior wall. Subtraction (SDS):  No evidence of ischemia.  Impression Exercise Capacity:  Fair exercise capacity. BP Response:  ? hypotensive response (SBP 153 to 129 and then 209 in recovery) Clinical Symptoms:  There is dyspnea. ECG Impression:  No significant ST segment change suggestive of ischemia. Comparison with Prior Nuclear Study: No images to compare  Overall Impression:  Low risk stress nuclear study with a small, severe, fixed defect in the distal inferior wall/apex suggestive of small prior infarct; no ischemia.  LV Wall Motion:  NL LV Function; NL Wall Motion   Kirk Ruths, MD  08/22/2014 1:22 PM

## 2014-08-27 ENCOUNTER — Telehealth: Payer: Self-pay | Admitting: *Deleted

## 2014-08-27 NOTE — Telephone Encounter (Signed)
-----   Message from Isaiah Serge, NP sent at 08/27/2014  7:47 AM EST ----- Pt's stress test stable, she should keep appt with Dr. Roni Bread   She is not a new pt.

## 2014-08-27 NOTE — Telephone Encounter (Signed)
Left message to callback Concerning  myoview result

## 2014-08-27 NOTE — Telephone Encounter (Signed)
Spoke to patient. Result given . Verbalized understanding  

## 2014-08-29 ENCOUNTER — Encounter: Payer: Self-pay | Admitting: Family Medicine

## 2014-08-30 ENCOUNTER — Encounter: Payer: Self-pay | Admitting: Internal Medicine

## 2014-10-04 ENCOUNTER — Ambulatory Visit (INDEPENDENT_AMBULATORY_CARE_PROVIDER_SITE_OTHER): Payer: 59 | Admitting: Family Medicine

## 2014-10-04 ENCOUNTER — Emergency Department (HOSPITAL_COMMUNITY)
Admission: EM | Admit: 2014-10-04 | Discharge: 2014-10-04 | Disposition: A | Discharge status: Home / Self Care | Payer: 59 | Attending: Emergency Medicine | Admitting: Emergency Medicine

## 2014-10-04 ENCOUNTER — Encounter (HOSPITAL_COMMUNITY): Payer: Self-pay | Admitting: Emergency Medicine

## 2014-10-04 ENCOUNTER — Encounter: Payer: Self-pay | Admitting: Family Medicine

## 2014-10-04 ENCOUNTER — Emergency Department (HOSPITAL_COMMUNITY): Payer: 59

## 2014-10-04 VITALS — BP 90/60 | HR 99 | Temp 99.0°F | Ht 69.0 in

## 2014-10-04 DIAGNOSIS — E785 Hyperlipidemia, unspecified: Secondary | ICD-10-CM | POA: Insufficient documentation

## 2014-10-04 DIAGNOSIS — I959 Hypotension, unspecified: Secondary | ICD-10-CM

## 2014-10-04 DIAGNOSIS — I252 Old myocardial infarction: Secondary | ICD-10-CM | POA: Diagnosis not present

## 2014-10-04 DIAGNOSIS — E860 Dehydration: Secondary | ICD-10-CM

## 2014-10-04 DIAGNOSIS — R112 Nausea with vomiting, unspecified: Secondary | ICD-10-CM

## 2014-10-04 DIAGNOSIS — I2510 Atherosclerotic heart disease of native coronary artery without angina pectoris: Secondary | ICD-10-CM | POA: Insufficient documentation

## 2014-10-04 DIAGNOSIS — Z79899 Other long term (current) drug therapy: Secondary | ICD-10-CM | POA: Insufficient documentation

## 2014-10-04 DIAGNOSIS — Z9861 Coronary angioplasty status: Secondary | ICD-10-CM | POA: Diagnosis not present

## 2014-10-04 DIAGNOSIS — R05 Cough: Secondary | ICD-10-CM

## 2014-10-04 DIAGNOSIS — I10 Essential (primary) hypertension: Secondary | ICD-10-CM | POA: Diagnosis not present

## 2014-10-04 DIAGNOSIS — R1110 Vomiting, unspecified: Secondary | ICD-10-CM | POA: Insufficient documentation

## 2014-10-04 DIAGNOSIS — J111 Influenza due to unidentified influenza virus with other respiratory manifestations: Secondary | ICD-10-CM | POA: Diagnosis not present

## 2014-10-04 DIAGNOSIS — Z7982 Long term (current) use of aspirin: Secondary | ICD-10-CM | POA: Insufficient documentation

## 2014-10-04 DIAGNOSIS — J1189 Influenza due to unidentified influenza virus with other manifestations: Secondary | ICD-10-CM | POA: Diagnosis not present

## 2014-10-04 DIAGNOSIS — Z880 Allergy status to penicillin: Secondary | ICD-10-CM | POA: Diagnosis not present

## 2014-10-04 DIAGNOSIS — R059 Cough, unspecified: Secondary | ICD-10-CM | POA: Insufficient documentation

## 2014-10-04 LAB — COMPREHENSIVE METABOLIC PANEL
ALT: 36 U/L — AB (ref 0–35)
AST: 32 U/L (ref 0–37)
Albumin: 4.1 g/dL (ref 3.5–5.2)
Alkaline Phosphatase: 65 U/L (ref 39–117)
Anion gap: 12 (ref 5–15)
BUN: 11 mg/dL (ref 6–23)
CHLORIDE: 104 mmol/L (ref 96–112)
CO2: 21 mmol/L (ref 19–32)
Calcium: 8.9 mg/dL (ref 8.4–10.5)
Creatinine, Ser: 0.86 mg/dL (ref 0.50–1.10)
GFR calc Af Amer: 88 mL/min — ABNORMAL LOW (ref >90)
GFR, EST NON AFRICAN AMERICAN: 76 mL/min — AB (ref >90)
GLUCOSE: 123 mg/dL — AB (ref 70–99)
Potassium: 4 mmol/L (ref 3.5–5.1)
Sodium: 137 mmol/L (ref 135–145)
TOTAL PROTEIN: 8.1 g/dL (ref 6.0–8.3)
Total Bilirubin: 0.9 mg/dL (ref 0.3–1.2)

## 2014-10-04 LAB — CBC WITH DIFFERENTIAL/PLATELET
BASOS PCT: 0 % (ref 0–1)
Basophils Absolute: 0 10*3/uL (ref 0.0–0.1)
EOS ABS: 0.1 10*3/uL (ref 0.0–0.7)
Eosinophils Relative: 2 % (ref 0–5)
HCT: 37.7 % (ref 36.0–46.0)
HEMOGLOBIN: 12 g/dL (ref 12.0–15.0)
Lymphocytes Relative: 16 % (ref 12–46)
Lymphs Abs: 1.2 10*3/uL (ref 0.7–4.0)
MCH: 27.3 pg (ref 26.0–34.0)
MCHC: 31.8 g/dL (ref 30.0–36.0)
MCV: 85.9 fL (ref 78.0–100.0)
MONO ABS: 0.8 10*3/uL (ref 0.1–1.0)
Monocytes Relative: 11 % (ref 3–12)
Neutro Abs: 5.4 10*3/uL (ref 1.7–7.7)
Neutrophils Relative %: 71 % (ref 43–77)
Platelets: 168 10*3/uL (ref 150–400)
RBC: 4.39 MIL/uL (ref 3.87–5.11)
RDW: 13.9 % (ref 11.5–15.5)
WBC: 7.5 10*3/uL (ref 4.0–10.5)

## 2014-10-04 LAB — URINALYSIS, ROUTINE W REFLEX MICROSCOPIC
GLUCOSE, UA: NEGATIVE mg/dL
Ketones, ur: NEGATIVE mg/dL
Leukocytes, UA: NEGATIVE
Nitrite: NEGATIVE
Protein, ur: 30 mg/dL — AB
Specific Gravity, Urine: >1.046 — ABNORMAL HIGH (ref 1.005–1.030)
UROBILINOGEN UA: 1 mg/dL (ref 0.0–1.0)
pH: 5.5 (ref 5.0–8.0)

## 2014-10-04 LAB — URINE MICROSCOPIC-ADD ON

## 2014-10-04 LAB — I-STAT CG4 LACTIC ACID, ED
LACTIC ACID, VENOUS: 2.2 mmol/L — AB (ref 0.5–2.0)
LACTIC ACID, VENOUS: 0.62 mmol/L (ref 0.5–2.0)

## 2014-10-04 LAB — POCT INFLUENZA A/B
INFLUENZA B, POC: POSITIVE
Influenza A, POC: POSITIVE

## 2014-10-04 LAB — I-STAT TROPONIN, ED: Troponin i, poc: 0 ng/mL (ref 0.00–0.08)

## 2014-10-04 LAB — LIPASE, BLOOD: Lipase: 18 U/L (ref 11–59)

## 2014-10-04 MED ORDER — OSELTAMIVIR PHOSPHATE 75 MG PO CAPS: 75.0000 mg | ORAL_CAPSULE | Freq: Two times a day (BID) | ORAL | Status: DC

## 2014-10-04 MED ORDER — SODIUM CHLORIDE 0.9 % IV BOLUS (SEPSIS)
2000.0000 mL | Freq: Once | INTRAVENOUS | Status: AC
  Administered 2014-10-04: 2000 mL via INTRAVENOUS

## 2014-10-04 MED ORDER — PROMETHAZINE HCL 25 MG PO TABS: 25.0000 mg | ORAL_TABLET | Freq: Four times a day (QID) | ORAL | Status: DC | PRN

## 2014-10-04 MED ORDER — PROMETHAZINE HCL 25 MG/ML IJ SOLN
25.0000 mg | Freq: Once | INTRAMUSCULAR | Status: AC
  Administered 2014-10-04: 25 mg via INTRAMUSCULAR

## 2014-10-04 MED ORDER — OSELTAMIVIR PHOSPHATE 75 MG PO CAPS
75.0000 mg | ORAL_CAPSULE | Freq: Once | ORAL | Status: AC
  Administered 2014-10-04: 75 mg via ORAL
  Filled 2014-10-04: qty 1

## 2014-10-04 MED ORDER — ACETAMINOPHEN 500 MG PO TABS
1000.0000 mg | ORAL_TABLET | Freq: Once | ORAL | Status: AC
Start: 2014-10-04 — End: 2014-10-04
  Administered 2014-10-04: 1000 mg via ORAL
  Filled 2014-10-04: qty 2

## 2014-10-04 MED ORDER — KETOROLAC TROMETHAMINE 30 MG/ML IJ SOLN
30.0000 mg | Freq: Once | INTRAMUSCULAR | Status: AC
  Administered 2014-10-04: 30 mg via INTRAVENOUS
  Filled 2014-10-04: qty 1

## 2014-10-04 NOTE — Addendum Note (Signed)
Addended by: Carter Kitten on: 10/04/2014 04:57 PM   Modules accepted: Orders

## 2014-10-04 NOTE — ED Notes (Signed)
EDP made aware of patient CG4 lactic result. 

## 2014-10-04 NOTE — ED Notes (Signed)
Pt, being sent by Urgent Care, c/o n/v and hypotension (90/60).  MD reports Pt tested positive for the flu.  25mg  Phenergan IM given at office.

## 2014-10-04 NOTE — Progress Notes (Signed)
Subjective:    Patient ID: Christine Barrett, female    DOB: 02-15-1961, 54 y.o.   MRN: [id number]  Cough Associated symptoms include a fever.  Dizziness Associated symptoms include coughing and a fever.  Fever  Associated symptoms include coughing.   54 year old  with History of HTN, CAD presents  with  4 days of sinus drainage, cold chills, fever (102F), nonproductive cough .  No blood in mucus. No ear pain, headache and frontal sinus pain. Feels very dizzy and ill in office. Weakness. Episode of emesis on way here to office in car in purse. Nausea, mild lower abdominal pain, no diarrhea. No SOB, no wheeze.  Using sudafed PE and nasal irrigation. Using tylenol for fever, took this AM.  Hx of sinus issues and allergies. Using zyrtec for allergies. Drinking water (20 oz today), decreased appetite in last 24 hours.  Did not get flu shot this year.  BP Readings from Last 3 Encounters:  10/04/14 90/60  08/13/14 142/68  08/10/14 140/90   She did take her imdur, amlodipine, coreg and losartan this AM.   Nonsmoker, no lung issues.     Review of Systems  Constitutional: Positive for fever.  Respiratory: Positive for cough.   Neurological: Positive for dizziness.       Objective:   Physical Exam  Constitutional: Vital signs are normal. She appears well-developed and well-nourished. She is cooperative.  Non-toxic appearance. She has a sickly appearance. She appears ill. No distress.  In wheelchair unable to stand given dizziness  HENT:  Head: Normocephalic.  Right Ear: Hearing, tympanic membrane, external ear and ear canal normal. Tympanic membrane is not erythematous, not retracted and not bulging. No middle ear effusion.  Left Ear: Hearing, tympanic membrane, external ear and ear canal normal. Tympanic membrane is not erythematous, not retracted and not bulging.  No middle ear effusion.  Nose: Rhinorrhea present. No mucosal edema. Right sinus exhibits no maxillary sinus  tenderness and no frontal sinus tenderness. Left sinus exhibits no maxillary sinus tenderness and no frontal sinus tenderness.  Mouth/Throat: Uvula is midline, oropharynx is clear and moist and mucous membranes are normal. No posterior oropharyngeal edema or posterior oropharyngeal erythema.  Eyes: Conjunctivae, EOM and lids are normal. Pupils are equal, round, and reactive to light. Lids are everted and swept, no foreign bodies found.  Neck: Trachea normal and normal range of motion. Neck supple. Carotid bruit is not present. No thyroid mass and no thyromegaly present.  Cardiovascular: Normal rate, regular rhythm, S1 normal, S2 normal, normal heart sounds, intact distal pulses and normal pulses.  Exam reveals no gallop and no friction rub.   No murmur heard. Pulmonary/Chest: Effort normal. No tachypnea. No respiratory distress. She has decreased breath sounds in the right upper field, the right middle field and the right lower field. She has no wheezes. She has no rhonchi. She has no rales.  Abdominal: Soft. Normal appearance and bowel sounds are normal. There is no tenderness.  Neurological: She is alert.  Skin: Skin is warm and intact. No rash noted. She is diaphoretic.  Psychiatric: Her speech is normal and behavior is normal. Judgment and thought content normal. Her mood appears not anxious. Cognition and memory are normal. She does not exhibit a depressed mood.          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 Positive flu test in office. Pt needs IV fluids,  CXR and close monitoring. Will send to ER for likely admission. Given phenergan  25 mg IM x 1.

## 2014-10-04 NOTE — ED Notes (Signed)
Pt tested positive for both flu A and B.  Pt states that she has been vomiting.  Was given antiemetics at [facility].

## 2014-10-04 NOTE — ED Provider Notes (Signed)
CSN: [id number]     Arrival date & time 10/04/14  1718 History   First MD Initiated Contact with Patient 10/04/14 1850     Chief Complaint  Patient presents with  . Influenza  . Hypotension     (Consider location/radiation/quality/duration/timing/severity/associated sxs/prior Treatment) The history is provided by the patient.  Christine Barrett is a 54 y.o. female hx of HTN, HL, CAD s/p stent here with influenza, nausea, hypotension. She has been having some productive cough for the last 2 days. Also some myalgias and subjective fevers. She has some nausea vomiting today but no abdominal pain. Went to see primary care doctor and was positive for flu A and flu B. She was given Phenergan in the office was noted to be hypotensive so sent to the ER for evaluation. Didn't get the flu shot last year.    Past Medical History  Diagnosis Date  . Hypertension   . Hyperlipemia   . Seasonal allergies   . Sinusitis   . CAD S/P percutaneous coronary angioplasty     Ostial AV G Cx - 2.5 mm Angiosculpt; mid LAD 40-60%  . Non-ST elevation (NSTEMI) myocardial infarction 10/01/2013    Due to Accelerated HTN with existing CAD  . Chronic bronchitis     "frequently; not q yr" (10/03/2013)  . Migraine     "@ least once/month" (10/03/2013)   Past Surgical History  Procedure Laterality Date  . Cesarean section  1989  . Nasal septoplasty w/ turbinoplasty  ~ 2007  . Coronary angioplasty  10/03/2013    95% ostial AV G Cx - 2.5 mm AngioSculpt Balloon PTCA; mid LAD 40-60%  . Tubal ligation  1989  . Abdominal hysterectomy  1994    "partial"  . Left heart catheterization with coronary angiogram N/A 10/02/2013    Procedure: LEFT HEART CATHETERIZATION WITH CORONARY ANGIOGRAM;  Surgeon: [name], MD;  Location: [facility] CATH LAB;  Service: Cardiovascular;  Laterality: N/A;  . Percutaneous coronary stent intervention (pci-s) N/A 10/03/2013    cutting balloon angioplasty only no stent.    Family History  Problem Relation Age  of Onset  . Diabetes Mother   . Breast cancer Sister    History  Substance Use Topics  . Smoking status: Never Smoker   . Smokeless tobacco: Never Used  . Alcohol Use: No   OB History    No data available     Review of Systems  Constitutional: Positive for fever and chills.  Gastrointestinal: Positive for vomiting.  All other systems reviewed and are negative.     Allergies  Penicillins  Home Medications   Prior to Admission medications   Medication Sig Start Date End Date Taking? Authorizing Provider  acetaminophen (TYLENOL) 500 MG tablet Take 1,000 mg by mouth every 6 (six) hours as needed for moderate pain or headache.   Yes Historical Provider, MD  amLODipine (NORVASC) 5 MG tablet Take 1 tablet (5 mg total) by mouth daily. 02/21/14  Yes Renella Cunas, MD  aspirin EC 81 MG EC tablet Take 1 tablet (81 mg total) by mouth daily. 10/04/13  Yes Geradine Girt, DO  atorvastatin (LIPITOR) 40 MG tablet Take 1 tablet (40 mg total) by mouth daily. 08/13/14  Yes Peter M Martinique, MD  carvedilol (COREG) 25 MG tablet Take 1 tablet (25 mg total) by mouth 2 (two) times daily. 12/27/13  Yes Lance Bosch, NP  cetirizine (ZYRTEC) 10 MG tablet Take 10 mg by mouth daily.   Yes Historical  Provider, MD  fluticasone (FLONASE) 50 MCG/ACT nasal spray Place 1 spray into both nostrils 2 (two) times daily as needed for allergies or rhinitis.   Yes Historical Provider, MD  isosorbide mononitrate (IMDUR) 60 MG 24 hr tablet TAKE 1 TABLET BY MOUTH DAILY. 08/06/14  Yes Renella Cunas, MD  losartan (COZAAR) 100 MG tablet Take 1 tablet (100 mg total) by mouth daily. 10/18/13  Yes Renella Cunas, MD  omeprazole (PRILOSEC) 20 MG capsule Take 1 capsule (20 mg total) by mouth daily. Patient taking differently: Take 20 mg by mouth 2 (two) times daily before a meal.  07/27/14  Yes Waynetta Pean, PA-C  oseltamivir (TAMIFLU) 75 MG capsule Take 1 capsule (75 mg total) by mouth 2 (two) times daily. 10/04/14  Yes Amy Cletis Athens, MD   promethazine (PHENERGAN) 25 MG tablet Take 1 tablet (25 mg total) by mouth every 6 (six) hours as needed for nausea or vomiting. 10/04/14   Wandra Arthurs, MD   BP 121/61 mmHg  Pulse 86  Temp(Src) 100.1 F (37.8 C) (Oral)  Resp 20  SpO2 100% Physical Exam  Constitutional: She is oriented to person, place, and time.  Uncomfortable   HENT:  Head: Normocephalic.  MM dry   Eyes: Conjunctivae are normal. Pupils are equal, round, and reactive to light.  Neck: Normal range of motion. Neck supple.  No meningeal signs   Cardiovascular: Regular rhythm and normal heart sounds.   Slightly tachy   Pulmonary/Chest: Effort normal and breath sounds normal. No respiratory distress. She has no wheezes. She has no rales.  Abdominal: Soft. Bowel sounds are normal. She exhibits no distension. There is no tenderness. There is no rebound and no guarding.  Musculoskeletal: Normal range of motion. She exhibits no edema or tenderness.  Neurological: She is alert and oriented to person, place, and time. No cranial nerve deficit. Coordination normal.  Skin: Skin is warm and dry.  Psychiatric: She has a normal mood and affect. Her behavior is normal. Judgment and thought content normal.  Nursing note and vitals reviewed.   ED Course  Procedures (including critical care time) Labs Review Labs Reviewed  COMPREHENSIVE METABOLIC PANEL - Abnormal; Notable for the following:    Glucose, Bld 123 (*)    ALT 36 (*)    GFR calc non Af Amer 76 (*)    GFR calc Af Amer 88 (*)    All other components within normal limits  URINALYSIS, ROUTINE W REFLEX MICROSCOPIC - Abnormal; Notable for the following:    Color, Urine AMBER (*)    APPearance TURBID (*)    Specific Gravity, Urine >1.046 (*)    Hgb urine dipstick MODERATE (*)    Bilirubin Urine SMALL (*)    Protein, ur 30 (*)    All other components within normal limits  URINE MICROSCOPIC-ADD ON - Abnormal; Notable for the following:    Squamous Epithelial / LPF FEW  (*)    Bacteria, UA MANY (*)    Casts HYALINE CASTS (*)    All other components within normal limits  I-STAT CG4 LACTIC ACID, ED - Abnormal; Notable for the following:    Lactic Acid, Venous 2.20 (*)    All other components within normal limits  CULTURE, BLOOD (ROUTINE X 2)  CULTURE, BLOOD (ROUTINE X 2)  CBC WITH DIFFERENTIAL/PLATELET  LIPASE, BLOOD  I-STAT TROPOININ, ED  I-STAT CG4 LACTIC ACID, ED    Imaging Review Dg Chest 2 View  10/04/2014   CLINICAL DATA:  Influenza. Patient complains of fever, shortness of breath, cough, body aches.  EXAM: CHEST  2 VIEW  COMPARISON:  July 26, 2014, August 27, 2010, October 01, 2013  FINDINGS: The heart size and mediastinal contours are stable. The heart size is upper limits are normal. There is no focal infiltrate, pulmonary edema, or pleural effusion. Small calcified granuloma is identified in the left upper to mid lung stable compared to prior exams. The visualized skeletal structures are unremarkable.  IMPRESSION: No active cardiopulmonary disease.   Electronically Signed   By: Abelardo Diesel M.D.   On: 10/04/2014 21:01     EKG Interpretation None      MDM   Final diagnoses:  Influenza  Dehydration    Florentina Marquart is a 54 y.o. female here presenting with flu, fever. No meningeal signs. Will do sepsis workup to r/o super imposed bacterial infection.   11:17 PM Lactate 2.2 initially, resolved with 2 L NS. WBC nl. UA contaminated and urine and blood cultures sent. No urinary symptoms so hold abx. BP improved to 121/61 on discharge. Tolerated food and tamiflu. Will dc home with tamiflu, phenergan.      Wandra Arthurs, MD 10/04/14 [id number]

## 2014-10-04 NOTE — Patient Instructions (Signed)
You have the flu. Given phenergan 25 mg IM x 1. Go to [place], charge nurse is aware you are coming.

## 2014-10-04 NOTE — Discharge Instructions (Signed)
Stay hydrated.   Take tylenol, motrin for fever.   Take tamiflu as prescribed.   Follow up with your doctor.   Return to ER if you have vomiting, worse dehydration, persistent fevers for a week.

## 2014-10-06 LAB — URINE CULTURE: Colony Count: 30000

## 2014-10-08 ENCOUNTER — Ambulatory Visit: Payer: Self-pay | Admitting: Cardiology

## 2014-10-09 ENCOUNTER — Encounter: Payer: Self-pay | Admitting: Family Medicine

## 2014-10-09 ENCOUNTER — Ambulatory Visit (INDEPENDENT_AMBULATORY_CARE_PROVIDER_SITE_OTHER): Payer: 59 | Admitting: Family Medicine

## 2014-10-09 VITALS — BP 124/80 | HR 72 | Temp 98.4°F | Ht 69.0 in | Wt 267.5 lb

## 2014-10-09 DIAGNOSIS — E860 Dehydration: Secondary | ICD-10-CM | POA: Diagnosis not present

## 2014-10-09 DIAGNOSIS — J1189 Influenza due to unidentified influenza virus with other manifestations: Secondary | ICD-10-CM | POA: Diagnosis not present

## 2014-10-09 DIAGNOSIS — R42 Dizziness and giddiness: Secondary | ICD-10-CM

## 2014-10-09 DIAGNOSIS — J111 Influenza due to unidentified influenza virus with other respiratory manifestations: Secondary | ICD-10-CM | POA: Insufficient documentation

## 2014-10-09 NOTE — Patient Instructions (Signed)
Stay out of work until  Monday. Rest, push fluids. Complete tamiflu. Call if new fever or if dizziness not improving with time. Expect to take a while to have energy back and cough resolve.

## 2014-10-09 NOTE — Progress Notes (Signed)
   Subjective:    Patient ID: Christine Barrett, female    DOB: May 22, 1961, 54 y.o.   MRN: [id number]  HPI  54 year old female presents for follow up ER visit for influenza and dehydration.  She was given IV fluids.  CXR showed:  NAD. Started on tamiflu and given phenergan.  Neg blood culture  BP Readings from Last 3 Encounters:  10/09/14 124/80  10/04/14 110/62  10/04/14 90/60   She is on last dose of tamiflu is tonight. No fevers lately. She feel that tamiflu is causing dizziness.  She has some cough, no body aches.  She has BM after each meal. Poor appetite. No Nausea/diarrhea.  She had not been taking any BP med until yesterday. Review of Systems  Constitutional: Positive for fatigue. Negative for fever.  HENT: Negative for ear pain.   Eyes: Negative for pain.  Respiratory: Positive for cough. Negative for chest tightness and shortness of breath.   Cardiovascular: Negative for chest pain, palpitations and leg swelling.  Gastrointestinal: Negative for abdominal pain.  Genitourinary: Negative for dysuria.  Neurological: Positive for light-headedness. Negative for headaches.         Objective:   Physical Exam  Constitutional: Vital signs are normal. She appears well-developed and well-nourished. She is cooperative.  Non-toxic appearance. She appears ill. No distress.  Weak appearing but much better than last week.  HENT:  Head: Normocephalic.  Right Ear: Hearing, tympanic membrane, external ear and ear canal normal. Tympanic membrane is not erythematous, not retracted and not bulging.  Left Ear: Hearing, tympanic membrane, external ear and ear canal normal. Tympanic membrane is not erythematous, not retracted and not bulging.  Nose: No mucosal edema or rhinorrhea. Right sinus exhibits no maxillary sinus tenderness and no frontal sinus tenderness. Left sinus exhibits no maxillary sinus tenderness and no frontal sinus tenderness.  Mouth/Throat: Uvula is midline, oropharynx is  clear and moist and mucous membranes are normal.  Eyes: Conjunctivae, EOM and lids are normal. Pupils are equal, round, and reactive to light. Lids are everted and swept, no foreign bodies found.  Neck: Trachea normal and normal range of motion. Neck supple. Carotid bruit is not present. No thyroid mass and no thyromegaly present.  Cardiovascular: Normal rate, regular rhythm, S1 normal, S2 normal, normal heart sounds, intact distal pulses and normal pulses.  Exam reveals no gallop and no friction rub.   No murmur heard. Pulmonary/Chest: Effort normal and breath sounds normal. No tachypnea. No respiratory distress. She has no decreased breath sounds. She has no wheezes. She has no rhonchi. She has no rales.  Abdominal: Soft. Normal appearance and bowel sounds are normal. There is no tenderness.  Neurological: She is alert.  Skin: Skin is warm, dry and intact. No rash noted.  Psychiatric: Her speech is normal and behavior is normal. Judgment and thought content normal. Her mood appears not anxious. Cognition and memory are normal. She does not exhibit a depressed mood.          Assessment & Plan:  Influenza, improving .  Discussed with pt will take a while for her to recover fully.  Call if new fever.  Dizziness likely due to weakness and inner ear issues. If not improving consider further eval.  Dehydration resolved. Push fluids.

## 2014-10-09 NOTE — Progress Notes (Signed)
Pre visit review using our clinic review tool, if applicable. No additional management support is needed unless otherwise documented below in the visit note. 

## 2014-10-11 LAB — CULTURE, BLOOD (ROUTINE X 2)
CULTURE: NO GROWTH
Culture: NO GROWTH

## 2014-10-19 ENCOUNTER — Ambulatory Visit (AMBULATORY_SURGERY_CENTER): Payer: 59 | Admitting: *Deleted

## 2014-10-19 ENCOUNTER — Encounter: Payer: Self-pay | Admitting: Family Medicine

## 2014-10-19 VITALS — Ht 69.0 in | Wt 273.0 lb

## 2014-10-19 DIAGNOSIS — Z1211 Encounter for screening for malignant neoplasm of colon: Secondary | ICD-10-CM

## 2014-10-19 MED ORDER — NA SULFATE-K SULFATE-MG SULF 17.5-3.13-1.6 GM/177ML PO SOLN: ORAL | Status: DC

## 2014-10-19 NOTE — Progress Notes (Signed)
Patient denies any allergies to eggs or soy. Patient denies any problems with anesthesia/sedation. Patient denies any oxygen use at home and does not take any diet/weight loss medications. EMMI education assisgned to patient on colonoscopy, this was explained and instructions given to patient. 

## 2014-10-29 ENCOUNTER — Other Ambulatory Visit: Payer: Self-pay | Admitting: Cardiology

## 2014-10-31 ENCOUNTER — Other Ambulatory Visit: Payer: Self-pay | Admitting: Cardiology

## 2014-11-01 NOTE — Telephone Encounter (Signed)
Rx(s) sent to pharmacy electronically.  

## 2014-11-02 ENCOUNTER — Encounter: Payer: 59 | Admitting: Internal Medicine

## 2014-11-05 ENCOUNTER — Ambulatory Visit (AMBULATORY_SURGERY_CENTER): Payer: 59 | Admitting: Internal Medicine

## 2014-11-05 ENCOUNTER — Encounter: Payer: Self-pay | Admitting: Internal Medicine

## 2014-11-05 VITALS — BP 150/75 | HR 66 | Temp 97.0°F | Resp 29 | Ht 69.0 in | Wt 273.0 lb

## 2014-11-05 DIAGNOSIS — Z1211 Encounter for screening for malignant neoplasm of colon: Secondary | ICD-10-CM

## 2014-11-05 MED ORDER — SODIUM CHLORIDE 0.9 % IV SOLN: 500.0000 mL | INTRAVENOUS | Status: DC

## 2014-11-05 NOTE — Progress Notes (Signed)
A/ox3 pleased with MAC, report to Robbin RN 

## 2014-11-05 NOTE — Op Note (Signed)
[facility]  [address] [place], [id number]   COLONOSCOPY PROCEDURE REPORT  PATIENT: Christine, Barrett  MR#: [id number] BIRTHDATE: 31-May-1961 , 54  yrs. old GENDER: female ENDOSCOPIST: Jerene Bears, MD REFERRED BY:Amy Cletis Athens, M.D. PROCEDURE DATE:  11/05/2014 PROCEDURE:   Colonoscopy, screening First Screening Colonoscopy - Avg.  risk and is 50 yrs.  old or older Yes.  Prior Negative Screening - Now for repeat screening. N/A  History of Adenoma - Now for follow-up colonoscopy & has been > or = to 3 yrs.  N/A ASA CLASS:   Class II INDICATIONS:Screening for colonic neoplasia and Colorectal Neoplasm Risk Assessment for this procedure is average risk. MEDICATIONS: Monitored anesthesia care and Propofol 200 mg IV  DESCRIPTION OF PROCEDURE:   After the risks benefits and alternatives of the procedure were thoroughly explained, informed consent was obtained.  The digital rectal exam revealed no rectal mass.   The LB [id number]  endoscope was introduced through the anus and advanced to the cecum, which was identified by both the appendix and ileocecal valve. No adverse events experienced. The quality of the prep was (MoviPrep was used) good.  The instrument was then slowly withdrawn as the colon was fully examined.   COLON FINDINGS: There was mild diverticulosis noted in the ascending colon and sigmoid colon.   The examination was otherwise normal. Retroflexed views revealed no abnormalities. The time to cecum = 4.0 Withdrawal time = 10.2   The scope was withdrawn and the procedure completed.  COMPLICATIONS: There were no immediate complications.  ENDOSCOPIC IMPRESSION: 1.   Mild diverticulosis was noted in the ascending colon and sigmoid colon 2.   The examination was otherwise normal  RECOMMENDATIONS: 1.  High fiber diet 2.  You should continue to follow colorectal cancer screening guidelines for "routine risk" patients with a repeat colonoscopy in 10  years.  There is no need for FOBT (stool) testing for at least 5 years.  eSigned:  Jerene Bears, MD 11/05/2014 9:04 AM   cc: Jinny Sanders, MD and The Patient

## 2014-11-05 NOTE — Patient Instructions (Signed)
Diverticulosis seen today, try to follow high fiber diet. Repeat colonoscopy in 10 years.  Handouts given on diverticulosis and high fiber diet. Call us with any questions or concerns. Thank you.   YOU HAD AN ENDOSCOPIC PROCEDURE TODAY AT [facility]:   Refer to the procedure report that was given to you for any specific questions about what was found during the examination.  If the procedure report does not answer your questions, please call your gastroenterologist to clarify.  If you requested that your care partner not be given the details of your procedure findings, then the procedure report has been included in a sealed envelope for you to review at your convenience later.  YOU SHOULD EXPECT: Some feelings of bloating in the abdomen. Passage of more gas than usual.  Walking can help get rid of the air that was put into your GI tract during the procedure and reduce the bloating. If you had a lower endoscopy (such as a colonoscopy or flexible sigmoidoscopy) you may notice spotting of blood in your stool or on the toilet paper. If you underwent a bowel prep for your procedure, you may not have a normal bowel movement for a few days.  Please Note:  You might notice some irritation and congestion in your nose or some drainage.  This is from the oxygen used during your procedure.  There is no need for concern and it should clear up in a day or so.  SYMPTOMS TO REPORT IMMEDIATELY:   Following lower endoscopy (colonoscopy or flexible sigmoidoscopy):  Excessive amounts of blood in the stool  Significant tenderness or worsening of abdominal pains  Swelling of the abdomen that is new, acute  Fever of 100F or higher   For urgent or emergent issues, a gastroenterologist can be reached at any hour by calling [phone].   DIET: Your first meal following the procedure should be a small meal and then it is ok to progress to your normal diet. Heavy or fried foods are harder to  digest and may make you feel nauseous or bloated.  Likewise, meals heavy in dairy and vegetables can increase bloating.  Drink plenty of fluids but you should avoid alcoholic beverages for 24 hours.  ACTIVITY:  You should plan to take it easy for the rest of today and you should NOT DRIVE or use heavy machinery until tomorrow (because of the sedation medicines used during the test).    FOLLOW UP: Our staff will call the number listed on your records the next business day following your procedure to check on you and address any questions or concerns that you may have regarding the information given to you following your procedure. If we do not reach you, we will leave a message.  However, if you are feeling well and you are not experiencing any problems, there is no need to return our call.  We will assume that you have returned to your regular daily activities without incident.  If any biopsies were taken you will be contacted by phone or by letter within the next 1-3 weeks.  Please call us at [phone] if you have not heard about the biopsies in 3 weeks.    SIGNATURES/CONFIDENTIALITY: You and/or your care partner have signed paperwork which will be entered into your electronic medical record.  These signatures attest to the fact that that the information above on your After Visit Summary has been reviewed and is understood.  Full responsibility of the confidentiality of  this discharge information lies with you and/or your care-partner.

## 2014-11-06 ENCOUNTER — Telehealth: Payer: Self-pay

## 2014-11-06 NOTE — Telephone Encounter (Signed)
  Follow up Call-  Call back number 11/05/2014  Post procedure Call Back phone  # [phone]  Permission to leave phone message Yes     Patient questions:  Do you have a fever, pain , or abdominal swelling? No. Pain Score  0 *  Have you tolerated food without any problems? Yes.    Have you been able to return to your normal activities? Yes.    Do you have any questions about your discharge instructions: Diet   No. Medications  No. Follow up visit  No.  Do you have questions or concerns about your Care? No.  Actions: * If pain score is 4 or above: No action needed, pain <4.  No problems per the pt. maw

## 2014-11-16 ENCOUNTER — Encounter: Payer: 59 | Admitting: Internal Medicine

## 2014-12-17 ENCOUNTER — Encounter: Payer: Self-pay | Admitting: Cardiology

## 2014-12-17 DIAGNOSIS — I152 Hypertension secondary to endocrine disorders: Secondary | ICD-10-CM | POA: Insufficient documentation

## 2014-12-17 DIAGNOSIS — J42 Unspecified chronic bronchitis: Secondary | ICD-10-CM | POA: Insufficient documentation

## 2014-12-17 DIAGNOSIS — E1159 Type 2 diabetes mellitus with other circulatory complications: Secondary | ICD-10-CM | POA: Insufficient documentation

## 2014-12-17 DIAGNOSIS — I10 Essential (primary) hypertension: Secondary | ICD-10-CM | POA: Insufficient documentation

## 2014-12-17 NOTE — Progress Notes (Signed)
PATIENTWally Barrett MRN: [id number] DOB: 1961-01-23 PCP: Eliezer Lofts, MD  Clinic Note: Chief Complaint  Patient presents with  . New Evaluation     no chest pain, shortness of breath-occassional when working out in heat, has edema in feet, no pain in legs, cramping in legs-and toes when using them a lot, no lightheadedness, no dizziness; patient states she has some numbness and tingling in her left arm- lasted about half a day  . Coronary Artery Disease    Status post PTCA  . Hypertension    HPI: Christine Barrett is a 54 y.o. female with a PMH below who presents today for CAD-h/o NSTEMI in April 2015 -> I performed Cutting Balloon PTCA of ostial AV Groove Cx as a staged PCI following diagnostic cath by Dr. Claiborne Billings.  Had been seeing Dr. Verl Blalock for Cardiology care - now here to establish care since obtaining health insurance coverage.  Cardiac and studies reviewed:  09/2013 - NSTEMI -   CATH-PTCA: ostial AV Groove Cx PTCA, moderate residual LAD.  Transthoracic Echo: EF 55-60%; mild LVH, no RWMA,   08/22/2014 -Myoview (ordered for SOB): Low risk stress nuclear study with a small, severe, fixed defect in the distal inferior wall/apex suggestive of small prior infarct; no ischemia.  LV Wall Motion: NL LV Function; NL Wall Motion Had Flu in April - was hypotensive.  Interval History: overall, she has been doing quite well since her MI. She pretty much denies any significant symptoms with the exception of dyspnea associated with hot and cold.  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 Mostly dealing with no relief in the Medicare Medicaid group..  Work has her in & out of homes & cars with heat & cold --> had Myoview in Feb.  No further CP/pressure.  Hard to balance time for exercise with her current life.  Is active during day - 8 hrs/day.  As long as not in direct heat for long time - ok.  Sleeps on 2-3 pillows/ propped up - for  years.  Would feel suffocated - has had nasal Sgx for deviated septum.  + snores.   Cardiovascular ROS: positive for - dyspnea on exertion, edema, orthopnea and see above; only wakes up SOB if she ends up flat; pedal edema @ end of day - goes down @ end of day negative for - chest pain, irregular heartbeat, loss of consciousness, murmur, palpitations, paroxysmal nocturnal dyspnea, rapid heart rate, shortness of breath or TIA/Amaurosis Fugax, syncope/near syncope :   Past Medical History  Diagnosis Date  . Essential hypertension     with prior Accelerated HTN  . Hyperlipemia   . Hx of non-ST elevation myocardial infarction (NSTEMI) 10/01/2013    Due to Accelerated HTN with existing CAD  . CAD S/P percutaneous coronary angioplasty 09/2013    a) Ostial AV G Cx - 2.5 mm Angiosculpt; mid LAD 40-60%; b) Myoview 07/2014: LOW RISK, small-severe fixed inferior defect c/w infarct w/o peri-infarct ischemia.  . Chronic bronchitis     "frequently; not q yr" (10/03/2013)  . Migraine     "@ least once/month" (10/03/2013)  . Seasonal allergies   . Sinusitis     Prior Cardiac Evaluation and Past Surgical History: Past Surgical History  Procedure Laterality Date  . Cesarean section  1989  . Nasal septoplasty w/ turbinoplasty  ~ 2007  . Coronary angioplasty  10/03/2013    95% ostial AV G Cx -  2.5 mm AngioSculpt Balloon PTCA; mid LAD 40-60%  . Tubal ligation  1989  . Abdominal hysterectomy  1994    "partial"  . Left heart catheterization with coronary angiogram N/A 10/02/2013    Procedure: LEFT HEART CATHETERIZATION WITH CORONARY ANGIOGRAM;  Surgeon: [name], MD;  Location: [facility] CATH LAB;  Service: Cardiovascular;  Laterality: N/A;  . Percutaneous coronary stent intervention (pci-s) N/A 10/03/2013    cutting balloon angioplasty only no stent.   . Transthoracic echocardiogram  10/02/2013    EF 55-60%; mild LVH, no RWMA,   . Nm myoview ltd  08/22/2014     Low risk stress nuclear study with a small, severe,  fixed defect in the distal inferior wall/apex suggestive of small prior infarct; no ischemia.  LV Wall Motion: NL LV Function; NL Wall Motion    Allergies  Allergen Reactions  . Penicillins Rash    Pt states she has taken since intial  reaction without recurrence.     Current Outpatient Prescriptions  Medication Sig Dispense Refill  . acetaminophen (TYLENOL) 500 MG tablet Take 1,000 mg by mouth every 6 (six) hours as needed for moderate pain or headache.    [name] amLODipine (NORVASC) 5 MG tablet Take 1 tablet (5 mg total) by mouth daily. 90 tablet 3  . aspirin EC 81 MG EC tablet Take 1 tablet (81 mg total) by mouth daily.    [name] atorvastatin (LIPITOR) 40 MG tablet Take 1 tablet (40 mg total) by mouth daily. 90 tablet 3  . carvedilol (COREG) 25 MG tablet Take 1 tablet (25 mg total) by mouth 2 (two) times daily. 180 tablet 3  . cetirizine (ZYRTEC) 10 MG tablet Take 10 mg by mouth daily.    . fluticasone (FLONASE) 50 MCG/ACT nasal spray Place 1 spray into both nostrils 2 (two) times daily as needed for allergies or rhinitis.    [name] isosorbide mononitrate (IMDUR) 60 MG 24 hr tablet TAKE 1 TABLET BY MOUTH DAILY. 30 tablet 6  . losartan (COZAAR) 100 MG tablet TAKE 1 TABLET BY MOUTH DAILY. 90 tablet 0  . omeprazole (PRILOSEC) 20 MG capsule Take 1 capsule (20 mg total) by mouth daily. (Patient taking differently: Take 20 mg by mouth 2 (two) times daily before a meal. ) 14 capsule 0   No current facility-administered medications for this visit.   Social History  reports that she has never smoked. She has never used smokeless tobacco. She reports that she does not drink alcohol or use illicit drugs. [place] - Medicare, property & casualty; Mostly with senior citizens.    Family History family history includes Breast cancer in her sister; Diabetes in her mother. There is no history of Colon cancer. She was adopted.  ROS: A comprehensive Review of Systems - was performed Review of  Systems  Constitutional: Negative for malaise/fatigue.  Eyes: Negative for blurred vision.  Cardiovascular: Positive for leg swelling.  Gastrointestinal: Negative for blood in stool and melena.  Musculoskeletal: Negative for myalgias.       L Shoulder decreased ROM  Neurological: Positive for dizziness (if takes meds in AM). Negative for headaches.  Endo/Heme/Allergies: Negative.   Psychiatric/Behavioral:       Lots of stress with work  All other systems reviewed and are negative.  Wt Readings from Last 3 Encounters:  12/18/14 123.651 kg (272 lb 9.6 oz)  11/05/14 123.832 kg (273 lb)  10/19/14 123.832 kg (273 lb)   PHYSICAL EXAM BP 140/110 mmHg  Pulse 81  Ht 5\' 10"  (1.778 m)  Wt 123.651 kg (272 lb 9.6 oz)  BMI 39.11 kg/m2 General appearance: alert, cooperative, appears stated age, no distress, moderately obese and Pleasant Mood and affect. Neck: no adenopathy, no carotid bruit, no JVD and supple, symmetrical, trachea midline Lungs: Nonlabored, good air movement Heart: regular rate and rhythm, S1, S2 normal, no murmur, click, rub or gallop and normal apical impulse Abdomen: soft, non-tender; bowel sounds normal; no masses,  no organomegaly and Moderate obesity Extremities: No C./C. with trace to 1+ edema; no significant varicosities or skin changes Pulses: No bruit Neurologic: Alert and oriented X 3, normal strength and tone. Normal symmetric reflexes. Normal coordination and gait   Adult ECG Report  Rate: 81 ;  Rhythm: normal sinus rhythm and Normal axis, intervals and durations.  Narrative Interpretation: normal EKG  Recent Labs: Pending - annual physical next month Lab Results  Component Value Date   CHOL 191 08/20/2014   HDL 45.70 08/20/2014   LDLCALC 125* 08/20/2014   LDLDIRECT 192.2 02/11/2009   TRIG 100.0 08/20/2014   CHOLHDL 4 08/20/2014    ASSESSMENT / PLAN: Rare 54 year old woman who is now looking to establish care with [place].  She is status post  PTCA for CAD in the setting of non-ST elevation MI. She has been relatively stable and been followed by Dr. Jenell Milliner.  Problem List Items Addressed This Visit    CAD S/P percutaneous coronary angioplasty - Ostial AVG Cx - Scoring balloon PTCA (Chronic)    Asymptomatic. Negative Myoview in February.  She is on status of statin, carvedilol and Cozaar along with Imdur and amlodipine. No longer on July for chemotherapy. She simply taking aspirin.      Chronic bronchitis   Relevant Orders   EKG 12-Lead (Completed)   Essential hypertension (Chronic)    Borderline BP today - has not yet taken AM meds (usually takes @ lunch)  Rec: Take Coreg in AM & PM , Take CCB & ARB @ lunch.  Monitor BP 2-3 x / week- if remains high - will increase Amlodipine to 10 mg      Relevant Orders   EKG 12-Lead (Completed)   H/O non-ST elevation myocardial infarction (NSTEMI) - Primary (Chronic)    Small, severe Inferior defect noted on Myoview. That's consistent with her CAD. No evidence of peri-infarct ischemia      Relevant Orders   EKG 12-Lead (Completed)   Hyperlipidemia with target LDL less than 70 (Chronic)    Has lipid panel pending with annual physical next month. On atorvastatin.  suboptimal control as of February 2016. HDL is fine, but LDL too high. She may need adjunctive therapy with Zetia  Await results.      Hypertensive urgency    Overall better blood pressure control with the addition of Norvasc. Has not had any recurrent episodes of hypertensive urgency since her non-ST elevation MI        She has multiple complaints all of which seemed to be not much cardiac in nature.  Multiple clinic notes, cardiac studies and other past medical history reviewed and updated.  Patient Instructions:  Monitor BLOOD PRESSURE IF IT IS GREATER THAN 140/90 CONSISTENTLY ,PLEASE CALL OFFICE.   TAKE COREG IN MORNING AND COREG IN THE EVENING. TAKE LOSARTAN AND AMLODIPINE AT LUNCH TIME.  PLEASE HAVE  YOUR PRIMARY SEND A COPY OF CHOLESTEROL LEVEL WHEN COMPLETED.  No orders of the defined types were placed in this encounter.    Followup: 6  months  Cadey Bazile W. Ellyn Hack, M.D., M.S. Interventional Cardiolgy [facility]

## 2014-12-18 ENCOUNTER — Ambulatory Visit (INDEPENDENT_AMBULATORY_CARE_PROVIDER_SITE_OTHER): Payer: 59 | Admitting: Cardiology

## 2014-12-18 ENCOUNTER — Encounter: Payer: Self-pay | Admitting: Cardiology

## 2014-12-18 VITALS — BP 140/110 | HR 81 | Ht 70.0 in | Wt 272.6 lb

## 2014-12-18 DIAGNOSIS — I252 Old myocardial infarction: Secondary | ICD-10-CM | POA: Diagnosis not present

## 2014-12-18 DIAGNOSIS — I10 Essential (primary) hypertension: Secondary | ICD-10-CM | POA: Diagnosis not present

## 2014-12-18 DIAGNOSIS — Z9861 Coronary angioplasty status: Secondary | ICD-10-CM

## 2014-12-18 DIAGNOSIS — J42 Unspecified chronic bronchitis: Secondary | ICD-10-CM

## 2014-12-18 DIAGNOSIS — E785 Hyperlipidemia, unspecified: Secondary | ICD-10-CM

## 2014-12-18 DIAGNOSIS — I2510 Atherosclerotic heart disease of native coronary artery without angina pectoris: Secondary | ICD-10-CM

## 2014-12-18 DIAGNOSIS — I160 Hypertensive urgency: Secondary | ICD-10-CM

## 2014-12-18 NOTE — Assessment & Plan Note (Signed)
Borderline BP today - has not yet taken AM meds (usually takes @ lunch)  Rec: Take Coreg in AM & PM , Take CCB & ARB @ lunch.  Monitor BP 2-3 x / week- if remains high - will increase Amlodipine to 10 mg

## 2014-12-18 NOTE — Patient Instructions (Signed)
Monitor BLOOD PRESSURE IF IT IS GREATER THAN 140/90 CONSISTENTLY ,PLEASE CALL OFFICE.   TAKE COREG IN MORNING AND COREG IN THE EVENING. TAKE LOSARTAN AND AMLODIPINE AT LUNCH TIME.  PLEASE HAVE YOUR PRIMARY SEND A COPY OF CHOLESTEROL LEVEL WHEN COMPLETED.   Your physician wants you to follow-up in [place].   You will receive a reminder letter in the mail two months in advance. If you don't receive a letter, please call our office to schedule the follow-up appointment.

## 2014-12-18 NOTE — Assessment & Plan Note (Addendum)
Has lipid panel pending with annual physical next month. On atorvastatin.  suboptimal control as of February 2016. HDL is fine, but LDL too high. She may need adjunctive therapy with Zetia  Await results.

## 2014-12-20 ENCOUNTER — Encounter: Payer: Self-pay | Admitting: Cardiology

## 2014-12-20 NOTE — Assessment & Plan Note (Addendum)
Small, severe Inferior defect noted on Myoview. That's consistent with her CAD. No evidence of peri-infarct ischemia

## 2014-12-20 NOTE — Assessment & Plan Note (Signed)
Overall better blood pressure control with the addition of Norvasc. Has not had any recurrent episodes of hypertensive urgency since her non-ST elevation MI

## 2014-12-20 NOTE — Assessment & Plan Note (Addendum)
Asymptomatic. Negative Myoview in February.  She is on status of statin, carvedilol and Cozaar along with Imdur and amlodipine. No longer on July for chemotherapy. She simply taking aspirin.

## 2014-12-27 ENCOUNTER — Ambulatory Visit: Payer: Self-pay | Admitting: Family Medicine

## 2014-12-28 ENCOUNTER — Telehealth: Payer: Self-pay | Admitting: *Deleted

## 2014-12-28 MED ORDER — LOSARTAN POTASSIUM 100 MG PO TABS: 100.0000 mg | ORAL_TABLET | Freq: Every day | ORAL | Status: DC

## 2014-12-28 NOTE — Telephone Encounter (Signed)
Rx(s) sent to pharmacy electronically.  

## 2014-12-31 ENCOUNTER — Telehealth: Payer: Self-pay | Admitting: Family Medicine

## 2014-12-31 DIAGNOSIS — E785 Hyperlipidemia, unspecified: Secondary | ICD-10-CM

## 2014-12-31 DIAGNOSIS — D649 Anemia, unspecified: Secondary | ICD-10-CM

## 2014-12-31 NOTE — Telephone Encounter (Signed)
-----   Message from Ellamae Sia sent at 12/24/2014  1:43 PM EDT ----- Regarding: lab orders for Wednesday, 7.6.16 Patient is scheduled for CPX labs, please order future labs, Thanks , Karna Christmas

## 2015-01-02 ENCOUNTER — Other Ambulatory Visit (INDEPENDENT_AMBULATORY_CARE_PROVIDER_SITE_OTHER): Payer: 59

## 2015-01-02 DIAGNOSIS — D649 Anemia, unspecified: Secondary | ICD-10-CM

## 2015-01-02 DIAGNOSIS — E785 Hyperlipidemia, unspecified: Secondary | ICD-10-CM

## 2015-01-02 LAB — CBC WITH DIFFERENTIAL/PLATELET
BASOS PCT: 0.4 % (ref 0.0–3.0)
Basophils Absolute: 0 10*3/uL (ref 0.0–0.1)
EOS ABS: 0.3 10*3/uL (ref 0.0–0.7)
Eosinophils Relative: 3.8 % (ref 0.0–5.0)
HCT: 35.1 % — ABNORMAL LOW (ref 36.0–46.0)
Hemoglobin: 11.4 g/dL — ABNORMAL LOW (ref 12.0–15.0)
LYMPHS PCT: 38 % (ref 12.0–46.0)
Lymphs Abs: 2.6 10*3/uL (ref 0.7–4.0)
MCHC: 32.6 g/dL (ref 30.0–36.0)
MCV: 83.5 fl (ref 78.0–100.0)
Monocytes Absolute: 0.7 10*3/uL (ref 0.1–1.0)
Monocytes Relative: 10 % (ref 3.0–12.0)
NEUTROS PCT: 47.8 % (ref 43.0–77.0)
Neutro Abs: 3.3 10*3/uL (ref 1.4–7.7)
Platelets: 221 10*3/uL (ref 150.0–400.0)
RBC: 4.21 Mil/uL (ref 3.87–5.11)
RDW: 14.4 % (ref 11.5–15.5)
WBC: 6.9 10*3/uL (ref 4.0–10.5)

## 2015-01-02 LAB — COMPREHENSIVE METABOLIC PANEL
ALBUMIN: 3.7 g/dL (ref 3.5–5.2)
ALT: 15 U/L (ref 0–35)
AST: 14 U/L (ref 0–37)
Alkaline Phosphatase: 67 U/L (ref 39–117)
BUN: 10 mg/dL (ref 6–23)
CHLORIDE: 108 meq/L (ref 96–112)
CO2: 26 mEq/L (ref 19–32)
Calcium: 8.9 mg/dL (ref 8.4–10.5)
Creatinine, Ser: 0.67 mg/dL (ref 0.40–1.20)
GFR: 118.32 mL/min (ref >60.00)
Glucose, Bld: 110 mg/dL — ABNORMAL HIGH (ref 70–99)
POTASSIUM: 3.7 meq/L (ref 3.5–5.1)
Sodium: 140 mEq/L (ref 135–145)
TOTAL PROTEIN: 7.1 g/dL (ref 6.0–8.3)
Total Bilirubin: 0.6 mg/dL (ref 0.2–1.2)

## 2015-01-02 LAB — LIPID PANEL
Cholesterol: 167 mg/dL (ref 0–200)
HDL: 38.3 mg/dL — ABNORMAL LOW (ref >39.00)
LDL CALC: 101 mg/dL — AB (ref 0–99)
NONHDL: 128.7
Total CHOL/HDL Ratio: 4
Triglycerides: 138 mg/dL (ref 0.0–149.0)
VLDL: 27.6 mg/dL (ref 0.0–40.0)

## 2015-01-04 ENCOUNTER — Ambulatory Visit (INDEPENDENT_AMBULATORY_CARE_PROVIDER_SITE_OTHER): Payer: 59 | Admitting: Family Medicine

## 2015-01-04 ENCOUNTER — Encounter: Payer: Self-pay | Admitting: Family Medicine

## 2015-01-04 VITALS — BP 130/80 | HR 78 | Temp 98.2°F | Ht 69.0 in | Wt 272.8 lb

## 2015-01-04 DIAGNOSIS — E785 Hyperlipidemia, unspecified: Secondary | ICD-10-CM | POA: Diagnosis not present

## 2015-01-04 DIAGNOSIS — I10 Essential (primary) hypertension: Secondary | ICD-10-CM

## 2015-01-04 DIAGNOSIS — Z0000 Encounter for general adult medical examination without abnormal findings: Secondary | ICD-10-CM

## 2015-01-04 MED ORDER — ATORVASTATIN CALCIUM 80 MG PO TABS: 80.0000 mg | ORAL_TABLET | Freq: Every day | ORAL | Status: DC

## 2015-01-04 NOTE — Assessment & Plan Note (Signed)
Well controlled. Continue current medication.  Follow BPs at home.

## 2015-01-04 NOTE — Progress Notes (Signed)
Pre visit review using our clinic review tool, if applicable. No additional management support is needed unless otherwise documented below in the visit note. 

## 2015-01-04 NOTE — Patient Instructions (Addendum)
Work on health eating and regular exercise.  Work on low carb diet, decrease potatos, pasta and sweet ice tea.  Increase atorvastain to 80 mg daily.  Schedule lab only visit for cholesterol check in 3 months.  Can start vit D3 400 IU 1-2 times daily.

## 2015-01-04 NOTE — Assessment & Plan Note (Signed)
Not at goal . Will try increasing atorvastatin to 80 mg daily, recheck in 3 months. If not tolerating will instead try zetia.

## 2015-01-04 NOTE — Progress Notes (Signed)
Subjective:    Patient ID: Christine Barrett, female    DOB: 1961-04-17, 54 y.o.   MRN: [id number]  HPI 54 year old female presents for wellness exam.  Hypertension:   Well controlled.   Followed by Dr Ellyn Hack., hx of CAD with angioplasty last seen 02/21/2014 for hypertensive urgency. She is on 4 med regimen with amlodipine, isosorbide, carvedilol ans losartan.  Last OV 12/18/2014. reviewed notes in detail. Using medication without problems or lightheadedness:  None Chest pain with exertion: None Edema:None Short of breath:None Average home BPs: does not have a cuff Other issues: BP Readings from Last 3 Encounters:  01/04/15 130/80  12/18/14 140/110  11/05/14 150/75   Elevated Cholesterol:  LDL not at goal < 70 with history of CVD. On atorvastatin 40 mg daily Lab Results  Component Value Date   CHOL 167 01/02/2015   HDL 38.30* 01/02/2015   LDLCALC 101* 01/02/2015   LDLDIRECT 192.2 02/11/2009   TRIG 138.0 01/02/2015   CHOLHDL 4 01/02/2015  Using medications without problems: Muscle aches:  Diet compliance: improving, drinking, more water Exercise: None Other complaints:  Prediabetes, stable.  Lab Results  Component Value Date   HGBA1C 5.9* 10/01/2013        Review of Systems  Constitutional: Negative for fever and fatigue.  HENT: Negative for ear pain.   Eyes: Negative for pain.  Respiratory: Negative for chest tightness and shortness of breath.   Cardiovascular: Negative for chest pain, palpitations and leg swelling.  Gastrointestinal: Negative for abdominal pain.  Genitourinary: Negative for dysuria.       Objective:   Physical Exam  Constitutional: Vital signs are normal. She appears well-developed and well-nourished. She is cooperative.  Non-toxic appearance. She does not appear ill. No distress.  HENT:  Head: Normocephalic.  Right Ear: Hearing, tympanic membrane, external ear and ear canal normal.  Left Ear: Hearing, tympanic membrane, external ear and ear  canal normal.  Nose: Nose normal.  Eyes: Conjunctivae, EOM and lids are normal. Pupils are equal, round, and reactive to light. Lids are everted and swept, no foreign bodies found.  Neck: Trachea normal and normal range of motion. Neck supple. Carotid bruit is not present. No thyroid mass and no thyromegaly present.  Cardiovascular: Normal rate, regular rhythm, S1 normal, S2 normal, normal heart sounds and intact distal pulses.  Exam reveals no gallop.   No murmur heard. Pulmonary/Chest: Effort normal and breath sounds normal. No respiratory distress. She has no wheezes. She has no rhonchi. She has no rales.  Abdominal: Soft. Normal appearance and bowel sounds are normal. She exhibits no distension, no fluid wave, no abdominal bruit and no mass. There is no hepatosplenomegaly. There is no tenderness. There is no rebound, no guarding and no CVA tenderness. No hernia.  Genitourinary: Vagina normal. No breast swelling, tenderness, discharge or bleeding. Pelvic exam was performed with patient supine. There is no rash, tenderness or lesion on the right labia. There is no rash, tenderness or lesion on the left labia. Right adnexum displays no mass, no tenderness and no fullness. Left adnexum displays no mass, no tenderness and no fullness.  Lymphadenopathy:    She has no cervical adenopathy.    She has no axillary adenopathy.  Neurological: She is alert. She has normal strength. No cranial nerve deficit or sensory deficit.  Skin: Skin is warm, dry and intact. No rash noted.  Psychiatric: Her speech is normal and behavior is normal. Judgment normal. Her mood appears not anxious. Cognition and memory  are normal. She does not exhibit a depressed mood.          Assessment & Plan:  The patient's preventative maintenance and recommended screening tests for an annual wellness exam were reviewed in full today. Brought up to date unless services declined.  Counselled on the importance of diet, exercise,  and its role in overall health and mortality. The patient's FH and SH was reviewed, including their home life, tobacco status, and drug and alcohol status.    Vaccines: uptodate tdap  Mammogram nml 06/2014 Colon: 10/2014 Dr. Hilarie Fredrickson no polyps, repeat in 10 years.  Partial hyterectomy, cervix remains per pt. Last 12/2013, on q3 year pap needs yearly DVE.

## 2015-06-18 ENCOUNTER — Other Ambulatory Visit: Payer: Self-pay | Admitting: Cardiology

## 2015-06-18 NOTE — Telephone Encounter (Signed)
Pt needs refill on blood pressure medication

## 2015-06-18 NOTE — Telephone Encounter (Signed)
Rx(s) sent to pharmacy electronically.  

## 2015-07-02 ENCOUNTER — Ambulatory Visit: Payer: Self-pay | Attending: Internal Medicine | Admitting: Internal Medicine

## 2015-07-02 ENCOUNTER — Encounter: Payer: Self-pay | Admitting: Internal Medicine

## 2015-07-02 VITALS — BP 160/96 | HR 81 | Temp 97.0°F | Resp 17 | Ht 69.0 in | Wt 285.0 lb

## 2015-07-02 DIAGNOSIS — E785 Hyperlipidemia, unspecified: Secondary | ICD-10-CM

## 2015-07-02 DIAGNOSIS — I10 Essential (primary) hypertension: Secondary | ICD-10-CM

## 2015-07-02 MED ORDER — AMLODIPINE BESYLATE 5 MG PO TABS: 5.0000 mg | ORAL_TABLET | Freq: Every day | ORAL | Status: DC

## 2015-07-02 MED ORDER — LOSARTAN POTASSIUM 100 MG PO TABS: 100.0000 mg | ORAL_TABLET | Freq: Every day | ORAL | Status: DC

## 2015-07-02 MED ORDER — CARVEDILOL 25 MG PO TABS: 25.0000 mg | ORAL_TABLET | Freq: Two times a day (BID) | ORAL | Status: DC

## 2015-07-02 MED ORDER — ATORVASTATIN CALCIUM 40 MG PO TABS: 80.0000 mg | ORAL_TABLET | Freq: Every day | ORAL | Status: DC

## 2015-07-02 MED ORDER — ISOSORBIDE MONONITRATE ER 60 MG PO TB24: 60.0000 mg | ORAL_TABLET | Freq: Every day | ORAL | Status: DC

## 2015-07-02 NOTE — Progress Notes (Signed)
Patient ID: Christine Barrett, female   DOB: April 12, 1961, 55 y.o.   MRN: [id number] Subjective:  Christine Barrett is a 55 y.o. female with hypertension. She has a past medical history of a MI. Patient has not been seen here in over one year. She states that she has been out of her losartan for over one month but has been taking amlodipine, Imdur, and coreg. Patient reports that she has not been checking her pressures at home and has not been to see her cardiologist in one year. She states that she has established care with a new cardiologist and plans to make a appointment when her insurance becomes active next month. Patient states that when she was seen by [facility] medicine last year her Atorvastatin was increased to 80 mg but it caused her muscle cramps so she eventually went back to 40 mg. She admits to not taking medication daily as directed.  Current Outpatient Prescriptions  Medication Sig Dispense Refill  . amLODipine (NORVASC) 5 MG tablet Take 1 tablet (5 mg total) by mouth daily. 90 tablet 3  . atorvastatin (LIPITOR) 80 MG tablet Take 1 tablet (80 mg total) by mouth daily. 30 tablet 11  . carvedilol (COREG) 25 MG tablet Take 1 tablet (25 mg total) by mouth 2 (two) times daily. 180 tablet 3  . isosorbide mononitrate (IMDUR) 60 MG 24 hr tablet TAKE 1 TABLET BY MOUTH DAILY. 30 tablet 6  . losartan (COZAAR) 100 MG tablet Take 1 tablet (100 mg total) by mouth daily. 90 tablet 3  . acetaminophen (TYLENOL) 500 MG tablet Take 1,000 mg by mouth every 6 (six) hours as needed for moderate pain or headache.    [name] aspirin EC 81 MG EC tablet Take 1 tablet (81 mg total) by mouth daily.    . cetirizine (ZYRTEC) 10 MG tablet Take 10 mg by mouth daily.    . fluticasone (FLONASE) 50 MCG/ACT nasal spray Place 1 spray into both nostrils 2 (two) times daily as needed for allergies or rhinitis.    [name] omeprazole (PRILOSEC) 20 MG capsule Take 1 capsule (20 mg total) by mouth daily. (Patient taking differently: Take 20 mg by mouth 2  (two) times daily before a meal. ) 14 capsule 0   No current facility-administered medications for this visit.    Hypertension ROS: taking medications as instructed, no medication side effects noted, no TIA's, no chest pain on exertion, no dyspnea on exertion, no swelling of ankles and no palpitations. Has noted some headaches.    Objective:  BP 160/96 mmHg  Pulse 81  Temp(Src) 97 F (36.1 C)  Resp 17  Ht 5\' 9"  (1.753 m)  Wt 285 lb (129.275 kg)  BMI 42.07 kg/m2  SpO2 100%  Appearance alert, well appearing, and in no distress, oriented to person, place, and time and overweight. General exam BP noted to be mildly elevated today in office though normal at other visits, S1, S2 normal, no gallop, no murmur, chest clear, no JVD, no HSM, no edema.  Lab review: She will return in 2 weeks for a lab visit and BP recheck.   Assessment:   Christine Barrett was seen today for medication refill.  Diagnoses and all orders for this visit:  Essential hypertension -     amLODipine (NORVASC) 5 MG tablet; Take 1 tablet (5 mg total) by mouth daily. -     carvedilol (COREG) 25 MG tablet; Take 1 tablet (25 mg total) by mouth 2 (two) times daily. -  isosorbide mononitrate (IMDUR) 60 MG 24 hr tablet; Take 1 tablet (60 mg total) by mouth daily. -     losartan (COZAAR) 100 MG tablet; Take 1 tablet (100 mg total) by mouth daily. Patient has ben non-complaint with medication regimen and appointment follow ups. Stressed to patient the long term complications associated with uncontrolled HTN. Patient encouraged to make follow up appointment with Cardiology soon. Meds refilled, she will come back in 2 weeks for a BP recheck and labs.  Hyperlipidemia with target LDL less than 70 -     atorvastatin (LIPITOR) 40 MG tablet; Take 2 tablets (80 mg total) by mouth daily. Patient encouraged to take medication daily especially with her history of prior MI. Her last LDL was 12/2014 which was not at goal. Since patient was unable to  tolerate 80 mg I will refill her 40 mg and advised her to make diet changes, lose weight, exercise. If after 6 months of taking 40 mg, and her LDL is not at goal may consider switching to Crestor.    Return in about 2 weeks (around 07/16/2015) for Nurse Visit-BP check and Lab visit and 3 mo PCP .   Lance Bosch, NP 07/02/2015 5:58 PM

## 2015-07-02 NOTE — Progress Notes (Signed)
Patient is here for medication refills Patient was told she can not get refills until she sees her doctor Presents with elevated blood pressure but has been out of her losartan for  A month

## 2015-07-16 ENCOUNTER — Ambulatory Visit: Payer: BLUE CROSS/BLUE SHIELD | Attending: Internal Medicine | Admitting: Pharmacist

## 2015-07-16 DIAGNOSIS — Z79899 Other long term (current) drug therapy: Secondary | ICD-10-CM | POA: Diagnosis present

## 2015-07-16 DIAGNOSIS — I10 Essential (primary) hypertension: Secondary | ICD-10-CM | POA: Insufficient documentation

## 2015-07-16 DIAGNOSIS — E785 Hyperlipidemia, unspecified: Secondary | ICD-10-CM

## 2015-07-16 LAB — COMPLETE METABOLIC PANEL WITH GFR
ALT: 16 U/L (ref 6–29)
AST: 15 U/L (ref 10–35)
Albumin: 3.8 g/dL (ref 3.6–5.1)
Alkaline Phosphatase: 48 U/L (ref 33–130)
BILIRUBIN TOTAL: 0.7 mg/dL (ref 0.2–1.2)
BUN: 10 mg/dL (ref 7–25)
CALCIUM: 8.8 mg/dL (ref 8.6–10.4)
CHLORIDE: 108 mmol/L (ref 98–110)
CO2: 22 mmol/L (ref 20–31)
CREATININE: 0.63 mg/dL (ref 0.50–1.05)
Glucose, Bld: 101 mg/dL — ABNORMAL HIGH (ref 65–99)
Potassium: 4.1 mmol/L (ref 3.5–5.3)
Sodium: 141 mmol/L (ref 135–146)
TOTAL PROTEIN: 6.5 g/dL (ref 6.1–8.1)

## 2015-07-16 LAB — LIPID PANEL
CHOLESTEROL: 250 mg/dL — AB (ref 125–200)
HDL: 40 mg/dL — ABNORMAL LOW (ref >=46)
LDL Cholesterol: 173 mg/dL — ABNORMAL HIGH (ref <130)
TRIGLYCERIDES: 186 mg/dL — AB (ref <150)
Total CHOL/HDL Ratio: 6.3 Ratio — ABNORMAL HIGH (ref <=5.0)
VLDL: 37 mg/dL — ABNORMAL HIGH (ref <30)

## 2015-07-16 NOTE — Patient Instructions (Signed)
Keep up the great work with the exercise.  Our goal for your blood pressure is <140/90 - hopefully the more you exercise and eat right, we can start stopping some of the medications  If you start to feel worse or more dizzy, come back and see me  Otherwise, follow up with [facility]  [place] stands for "Dietary Approaches to Stop Hypertension." The DASH eating plan is a healthy eating plan that has been shown to reduce high blood pressure (hypertension). Additional health benefits may include reducing the risk of type 2 diabetes mellitus, heart disease, and stroke. The DASH eating plan may also help with weight loss. WHAT DO I NEED TO KNOW ABOUT THE DASH EATING PLAN? For the DASH eating plan, you will follow these general guidelines:  Choose foods with a percent daily value for sodium of less than 5% (as listed on the food label).  Use salt-free seasonings or herbs instead of table salt or sea salt.  Check with your health care provider or pharmacist before using salt substitutes.  Eat lower-sodium products, often labeled as "lower sodium" or "no salt added."  Eat fresh foods.  Eat more vegetables, fruits, and low-fat dairy products.  Choose whole grains. Look for the word "whole" as the first word in the ingredient list.  Choose fish and skinless chicken or [place] more often than red meat. Limit fish, poultry, and meat to 6 oz (170 g) each day.  Limit sweets, desserts, sugars, and sugary drinks.  Choose heart-healthy fats.  Limit cheese to 1 oz (28 g) per day.  Eat more home-cooked food and less restaurant, buffet, and fast food.  Limit fried foods.  Cook foods using methods other than frying.  Limit canned vegetables. If you do use them, rinse them well to decrease the sodium.  When eating at a restaurant, ask that your food be prepared with less salt, or no salt if possible. WHAT FOODS CAN I EAT? Seek help from a dietitian for individual calorie  needs. Grains Whole grain or whole wheat bread. Brown rice. Whole grain or whole wheat pasta. Quinoa, bulgur, and whole grain cereals. Low-sodium cereals. Corn or whole wheat flour tortillas. Whole grain cornbread. Whole grain crackers. Low-sodium crackers. Vegetables Fresh or frozen vegetables (raw, steamed, roasted, or grilled). Low-sodium or reduced-sodium tomato and vegetable juices. Low-sodium or reduced-sodium tomato sauce and paste. Low-sodium or reduced-sodium canned vegetables.  Fruits All fresh, canned (in natural juice), or frozen fruits. Meat and Other Protein Products Ground beef (85% or leaner), grass-fed beef, or beef trimmed of fat. Skinless chicken or [place]. Ground chicken or [place]. Pork trimmed of fat. All fish and seafood. Eggs. Dried beans, peas, or lentils. Unsalted nuts and seeds. Unsalted canned beans. Dairy Low-fat dairy products, such as skim or 1% milk, 2% or reduced-fat cheeses, low-fat ricotta or cottage cheese, or plain low-fat yogurt. Low-sodium or reduced-sodium cheeses. Fats and Oils Tub margarines without trans fats. Light or reduced-fat mayonnaise and salad dressings (reduced sodium). Avocado. Safflower, olive, or canola oils. Natural peanut or almond butter. Other Unsalted popcorn and pretzels. The items listed above may not be a complete list of recommended foods or beverages. Contact your dietitian for more options. WHAT FOODS ARE NOT RECOMMENDED? Grains White bread. White pasta. White rice. Refined cornbread. Bagels and croissants. Crackers that contain trans fat. Vegetables Creamed or fried vegetables. Vegetables in a cheese sauce. Regular canned vegetables. Regular canned tomato sauce and paste. Regular tomato and vegetable juices. Fruits Dried fruits. Canned  fruit in light or heavy syrup. Fruit juice. Meat and Other Protein Products Fatty cuts of meat. Ribs, chicken wings, bacon, sausage, bologna, salami, chitterlings, fatback, hot dogs, bratwurst,  and packaged luncheon meats. Salted nuts and seeds. Canned beans with salt. Dairy Whole or 2% milk, cream, half-and-half, and cream cheese. Whole-fat or sweetened yogurt. Full-fat cheeses or blue cheese. Nondairy creamers and whipped toppings. Processed cheese, cheese spreads, or cheese curds. Condiments Onion and garlic salt, seasoned salt, table salt, and sea salt. Canned and packaged gravies. Worcestershire sauce. Tartar sauce. Barbecue sauce. Teriyaki sauce. Soy sauce, including reduced sodium. Steak sauce. Fish sauce. Oyster sauce. Cocktail sauce. Horseradish. Ketchup and mustard. Meat flavorings and tenderizers. Bouillon cubes. Hot sauce. Tabasco sauce. Marinades. Taco seasonings. Relishes. Fats and Oils Butter, stick margarine, lard, shortening, ghee, and bacon fat. Coconut, palm kernel, or palm oils. Regular salad dressings. Other Pickles and olives. Salted popcorn and pretzels. The items listed above may not be a complete list of foods and beverages to avoid. Contact your dietitian for more information. WHERE CAN I FIND MORE INFORMATION? National Heart, Lung, and Blood Institute: travelstabloid.com   This information is not intended to replace advice given to you by your health care provider. Make sure you discuss any questions you have with your health care provider.   Document Released: 06/04/2011 Document Revised: 07/06/2014 Document Reviewed: 04/19/2013 Elsevier Interactive Patient Education [employer].

## 2015-07-16 NOTE — Progress Notes (Signed)
S:    Patient arrives in good spirits.    Presents to the clinic for hypertension evaluation.   Patient reports adherence with medications.  Current BP Medications include:  Amlodipine 5 mg daily, carvedilol 25 mg BID, isosorbide mononitrate 60 mg daily, and losartan 100 mg daily  Patient reports that she has felt very tired since starting all of her blood pressure medications again.   She has started going to the gym everyday and her goal is to get off of some or all of her blood pressure medications.  She reports some sinus pressure today but denies taking pseudoephedrine - she understands that this can increase your blood pressure.    O:   Last 3 Office BP readings: BP Readings from Last 3 Encounters:  07/16/15 136/82  07/02/15 160/96  01/04/15 130/80    BMET    Component Value Date/Time   NA 140 01/02/2015 0758   K 3.7 01/02/2015 0758   CL 108 01/02/2015 0758   CO2 26 01/02/2015 0758   GLUCOSE 110* 01/02/2015 0758   BUN 10 01/02/2015 0758   CREATININE 0.67 01/02/2015 0758   CREATININE 0.69 11/30/2013 1223   CALCIUM 8.9 01/02/2015 0758   GFRNONAA 76* 10/04/2014 1826   GFRNONAA >89 11/30/2013 1223   GFRAA 88* 10/04/2014 1826   GFRAA >89 11/30/2013 1223    A/P: History of hypertension currently controlled on current medications. It actually may be even lower during other times of the day because it was about time for her to take her medications again and the effect of them may be wearing off. Instructed patient to monitor her blood pressure outside of the office. Her goal is <140/90 but >90/60. If it starts to become too low and she is symptomatic (dizzy, etc), we can consider decreasing or stopping one of the blood pressure medications. Patient verbalized understanding. Congratulated her on her weight loss and exercise efforts.  Results reviewed and written information provided.   Total time in face-to-face counseling 20 minutes.   F/U Clinic Visit with Chari Manning as  directed or with me if blood pressure is too low or too high.

## 2015-07-17 ENCOUNTER — Telehealth: Payer: Self-pay

## 2015-07-17 ENCOUNTER — Telehealth: Payer: Self-pay | Admitting: Internal Medicine

## 2015-07-17 ENCOUNTER — Telehealth: Payer: Self-pay | Admitting: Family Medicine

## 2015-07-17 NOTE — Telephone Encounter (Signed)
Tried to call patient this am  Patient not available Message left on voice mail to return our call 

## 2015-07-17 NOTE — Telephone Encounter (Signed)
Patient returned nurse phone call.

## 2015-07-17 NOTE — Telephone Encounter (Signed)
Try taking half a pill for 2 weeks to see if that helps.She is high risk for developing stroke or heart disease with such elevated cholesterol

## 2015-07-17 NOTE — Telephone Encounter (Signed)
Returned patient phone call Patient not available Left message on voice  Mail to return our call

## 2015-07-17 NOTE — Telephone Encounter (Signed)
Returned patient phone call  Patient is supposed to taking atorvastatin 80 mg but has not been taking it for  About a month because it was making her legs cramp Currently not taking anything for her cholesterol

## 2015-07-17 NOTE — Telephone Encounter (Signed)
Pt. Returned call. Please f/u with pt. °

## 2015-07-17 NOTE — Telephone Encounter (Signed)
-----   Message from Lance Bosch, NP sent at 07/17/2015  8:29 AM EST ----- Cholesterol is really elevated. Has she been off the medication for a period of time? She really needs to work on exercise, weight loss, and avoiding specific foods---please discuss with patient.  If she has not been off the medication then I need to know so I can decide if changes should be made.

## 2015-07-18 ENCOUNTER — Telehealth: Payer: Self-pay

## 2015-07-18 NOTE — Telephone Encounter (Signed)
Patient returned phone call Patient was instructed to break her cholesterol medication in half and take only 40mg  For two weeks to see if that helps reduce the cramping in her legs Patient stated she will try that and let us know how she is doing

## 2015-07-18 NOTE — Telephone Encounter (Signed)
Tried to contact patient this am Patient not available Left message on voice mail to return our call

## 2015-07-27 ENCOUNTER — Emergency Department (HOSPITAL_COMMUNITY): Payer: BLUE CROSS/BLUE SHIELD

## 2015-07-27 ENCOUNTER — Emergency Department (HOSPITAL_COMMUNITY)
Admission: EM | Admit: 2015-07-27 | Discharge: 2015-07-27 | Disposition: A | Discharge status: Home / Self Care | Payer: BLUE CROSS/BLUE SHIELD | Attending: Emergency Medicine | Admitting: Emergency Medicine

## 2015-07-27 ENCOUNTER — Encounter (HOSPITAL_COMMUNITY): Payer: Self-pay | Admitting: Emergency Medicine

## 2015-07-27 DIAGNOSIS — Z8709 Personal history of other diseases of the respiratory system: Secondary | ICD-10-CM | POA: Insufficient documentation

## 2015-07-27 DIAGNOSIS — G43909 Migraine, unspecified, not intractable, without status migrainosus: Secondary | ICD-10-CM | POA: Insufficient documentation

## 2015-07-27 DIAGNOSIS — R079 Chest pain, unspecified: Secondary | ICD-10-CM | POA: Insufficient documentation

## 2015-07-27 DIAGNOSIS — Z7982 Long term (current) use of aspirin: Secondary | ICD-10-CM | POA: Diagnosis not present

## 2015-07-27 DIAGNOSIS — D649 Anemia, unspecified: Secondary | ICD-10-CM | POA: Diagnosis not present

## 2015-07-27 DIAGNOSIS — E785 Hyperlipidemia, unspecified: Secondary | ICD-10-CM | POA: Diagnosis not present

## 2015-07-27 DIAGNOSIS — I252 Old myocardial infarction: Secondary | ICD-10-CM | POA: Diagnosis not present

## 2015-07-27 DIAGNOSIS — Z9861 Coronary angioplasty status: Secondary | ICD-10-CM | POA: Insufficient documentation

## 2015-07-27 DIAGNOSIS — I10 Essential (primary) hypertension: Secondary | ICD-10-CM | POA: Diagnosis not present

## 2015-07-27 DIAGNOSIS — Z79899 Other long term (current) drug therapy: Secondary | ICD-10-CM | POA: Diagnosis not present

## 2015-07-27 DIAGNOSIS — Z880 Allergy status to penicillin: Secondary | ICD-10-CM | POA: Diagnosis not present

## 2015-07-27 DIAGNOSIS — I2510 Atherosclerotic heart disease of native coronary artery without angina pectoris: Secondary | ICD-10-CM | POA: Insufficient documentation

## 2015-07-27 LAB — CBC
HEMATOCRIT: 37 % (ref 36.0–46.0)
HEMOGLOBIN: 11.7 g/dL — AB (ref 12.0–15.0)
MCH: 27.1 pg (ref 26.0–34.0)
MCHC: 31.6 g/dL (ref 30.0–36.0)
MCV: 85.6 fL (ref 78.0–100.0)
Platelets: 254 10*3/uL (ref 150–400)
RBC: 4.32 MIL/uL (ref 3.87–5.11)
RDW: 13.7 % (ref 11.5–15.5)
WBC: 7.4 10*3/uL (ref 4.0–10.5)

## 2015-07-27 LAB — I-STAT TROPONIN, ED
TROPONIN I, POC: 0 ng/mL (ref 0.00–0.08)
TROPONIN I, POC: 0.01 ng/mL (ref 0.00–0.08)

## 2015-07-27 LAB — BASIC METABOLIC PANEL
Anion gap: 11 (ref 5–15)
BUN: 13 mg/dL (ref 6–20)
CHLORIDE: 106 mmol/L (ref 101–111)
CO2: 23 mmol/L (ref 22–32)
CREATININE: 0.72 mg/dL (ref 0.44–1.00)
Calcium: 9.5 mg/dL (ref 8.9–10.3)
GFR calc Af Amer: >60 mL/min (ref >60)
GFR calc non Af Amer: >60 mL/min (ref >60)
Glucose, Bld: 126 mg/dL — ABNORMAL HIGH (ref 65–99)
POTASSIUM: 3.9 mmol/L (ref 3.5–5.1)
Sodium: 140 mmol/L (ref 135–145)

## 2015-07-27 MED ORDER — MORPHINE SULFATE (PF) 4 MG/ML IV SOLN
4.0000 mg | Freq: Once | INTRAVENOUS | Status: AC
  Administered 2015-07-27: 4 mg via INTRAVENOUS
  Filled 2015-07-27: qty 1

## 2015-07-27 NOTE — ED Provider Notes (Signed)
History  By signing my name below, I, Christine Barrett, attest that this documentation has been prepared under the direction and in the presence of Christine Fuel, MD. Electronically Signed: [name], ED Scribe. 07/27/2015. 3:04 AM.  Chief Complaint  Patient presents with  . Chest Pain   The history is provided by the patient and medical records. No language interpreter was used.    HPI Comments:  Christine Barrett is a 55 y.o. female with PMHx of HTN, MI and CAD s/p angioplasty approximately two years ago, brought in by EMS, who presents to the Emergency Department complaining of sharp, midsternal CP that began worsening this evening after lying down. She states she gets mild CP on a regular basis but states this pain got worse and started radiating down her left arm. She reports some SOB that has now resolved. She rates the pain at 2/10 (was 10/10) after three doses of Nitroglycerin from EMS. Pt took 4 aspirin prior to EMS arriving. Standing increases the pain. She denies alleviating factors. She denies nausea, sweats, pain that radiates to her back.   Past Medical History  Diagnosis Date  . Essential hypertension     with prior Accelerated HTN  . Hyperlipemia   . Hx of non-ST elevation myocardial infarction (NSTEMI) 10/01/2013    Due to Accelerated HTN with existing CAD  . CAD S/P percutaneous coronary angioplasty 09/2013    a) Ostial AV G Cx - 2.5 mm Angiosculpt; mid LAD 40-60%; b) Myoview 07/2014: LOW RISK, small-severe fixed inferior defect c/w infarct w/o peri-infarct ischemia.  . Chronic bronchitis ([place])     "frequently; not q yr" (10/03/2013)  . Migraine     "@ least once/month" (10/03/2013)  . Seasonal allergies   . Sinusitis    Past Surgical History  Procedure Laterality Date  . Cesarean section  1989  . Nasal septoplasty w/ turbinoplasty  ~ 2007  . Coronary angioplasty  10/03/2013    95% ostial AV G Cx - 2.5 mm AngioSculpt Balloon PTCA; mid LAD 40-60%  . Tubal ligation  1989   . Abdominal hysterectomy  1994    "partial"  . Left heart catheterization with coronary angiogram N/A 10/02/2013    Procedure: LEFT HEART CATHETERIZATION WITH CORONARY ANGIOGRAM;  Surgeon: [name], MD;  Location: [facility] CATH LAB;  Service: Cardiovascular;  Laterality: N/A;  . Percutaneous coronary stent intervention (pci-s) N/A 10/03/2013    cutting balloon angioplasty only no stent.   . Transthoracic echocardiogram  10/02/2013    EF 55-60%; mild LVH, no RWMA,   . Nm myoview ltd  08/22/2014     Low risk stress nuclear study with a small, severe, fixed defect in the distal inferior wall/apex suggestive of small prior infarct; no ischemia.  LV Wall Motion: NL LV Function; NL Wall Motion   Family History  Problem Relation Age of Onset  . Adopted: Yes  . Diabetes Mother   . Breast cancer Sister   . Colon cancer Neg Hx    Social History  Substance Use Topics  . Smoking status: Never Smoker   . Smokeless tobacco: Never Used  . Alcohol Use: No   OB History    No data available     Review of Systems  Respiratory: Positive for shortness of breath.   Cardiovascular: Positive for chest pain.  All other systems reviewed and are negative.   Allergies  Penicillins  Home Medications   Prior to Admission medications   Medication Sig Start Date End Date  Taking? Authorizing Provider  acetaminophen (TYLENOL) 500 MG tablet Take 1,000 mg by mouth every 6 (six) hours as needed for moderate pain or headache.    Historical Provider, MD  amLODipine (NORVASC) 5 MG tablet Take 1 tablet (5 mg total) by mouth daily. 07/02/15   Lance Bosch, NP  aspirin EC 81 MG EC tablet Take 1 tablet (81 mg total) by mouth daily. 10/04/13   Geradine Girt, DO  atorvastatin (LIPITOR) 40 MG tablet Take 2 tablets (80 mg total) by mouth daily. 07/02/15   Lance Bosch, NP  carvedilol (COREG) 25 MG tablet Take 1 tablet (25 mg total) by mouth 2 (two) times daily. 07/02/15   Lance Bosch, NP  cetirizine (ZYRTEC) 10 MG tablet  Take 10 mg by mouth daily.    Historical Provider, MD  fluticasone (FLONASE) 50 MCG/ACT nasal spray Place 1 spray into both nostrils 2 (two) times daily as needed for allergies or rhinitis.    Historical Provider, MD  isosorbide mononitrate (IMDUR) 60 MG 24 hr tablet Take 1 tablet (60 mg total) by mouth daily. 07/02/15   Lance Bosch, NP  losartan (COZAAR) 100 MG tablet Take 1 tablet (100 mg total) by mouth daily. 07/02/15   Lance Bosch, NP  omeprazole (PRILOSEC) 20 MG capsule Take 1 capsule (20 mg total) by mouth daily. Patient taking differently: Take 20 mg by mouth 2 (two) times daily before a meal.  07/27/14   Waynetta Pean, PA-C   SpO2 100% Physical Exam  Constitutional: She is oriented to person, place, and time. She appears well-developed and well-nourished. No distress.  HENT:  Head: Normocephalic and atraumatic.  Eyes: EOM are normal. Pupils are equal, round, and reactive to light.  Neck: Normal range of motion. Neck supple. No JVD present.  Cardiovascular: Normal rate, regular rhythm and normal heart sounds.  Exam reveals no gallop and no friction rub.   No murmur heard. Pulmonary/Chest: Effort normal and breath sounds normal. She has no wheezes. She has no rales. She exhibits no tenderness.  Mild bilateral parasternal tenderness  Abdominal: Soft. Bowel sounds are normal. She exhibits no distension and no mass. There is no tenderness.  Musculoskeletal: Normal range of motion. She exhibits no edema.  Lymphadenopathy:    She has no cervical adenopathy.  Neurological: She is alert and oriented to person, place, and time. No cranial nerve deficit. She exhibits normal muscle tone. Coordination normal.  Skin: Skin is warm and dry. No rash noted.  Psychiatric: She has a normal mood and affect. Her behavior is normal. Judgment and thought content normal.  Nursing note and vitals reviewed.   ED Course  Procedures (including critical care time) DIAGNOSTIC STUDIES: Oxygen Saturation is  100% on RA, normal by my interpretation.   COORDINATION OF CARE: 3:03 AM- Will order pain medication and labs. Pt verbalizes understanding and agrees to plan.  Medications - No data to display  Labs Review Results for orders placed or performed during the hospital encounter of [id number]  Basic metabolic panel  Result Value Ref Range   Sodium 140 135 - 145 mmol/L   Potassium 3.9 3.5 - 5.1 mmol/L   Chloride 106 101 - 111 mmol/L   CO2 23 22 - 32 mmol/L   Glucose, Bld 126 (H) 65 - 99 mg/dL   BUN 13 6 - 20 mg/dL   Creatinine, Ser 0.72 0.44 - 1.00 mg/dL   Calcium 9.5 8.9 - 10.3 mg/dL   GFR calc non Af Amer >  60 >60 mL/min   GFR calc Af Amer >60 >60 mL/min   Anion gap 11 5 - 15  CBC  Result Value Ref Range   WBC 7.4 4.0 - 10.5 K/uL   RBC 4.32 3.87 - 5.11 MIL/uL   Hemoglobin 11.7 (L) 12.0 - 15.0 g/dL   HCT 37.0 36.0 - 46.0 %   MCV 85.6 78.0 - 100.0 fL   MCH 27.1 26.0 - 34.0 pg   MCHC 31.6 30.0 - 36.0 g/dL   RDW 13.7 11.5 - 15.5 %   Platelets 254 150 - 400 K/uL  I-stat troponin, ED (not at [facility])  Result Value Ref Range   Troponin i, poc 0.00 0.00 - 0.08 ng/mL   Comment 3          I-stat troponin, ED  Result Value Ref Range   Troponin i, poc 0.01 0.00 - 0.08 ng/mL   Comment 3           Imaging Review Dg Chest 2 View  07/27/2015  CLINICAL DATA:  Acute onset of substernal chest pain and difficulty breathing. Initial encounter. EXAM: CHEST  2 VIEW COMPARISON:  Chest radiograph performed 10/04/2014 FINDINGS: The lungs are well-aerated. Mild vascular congestion is noted. There is no evidence of focal opacification, pleural effusion or pneumothorax. The heart is mildly enlarged. No acute osseous abnormalities are seen. A dense calcification overlying the lower thoracic spine may reflect soft tissue calcification. IMPRESSION: Mild vascular congestion and mild cardiomegaly. Lungs remain grossly clear. Electronically Signed   By: Garald Balding M.D.   On: 07/27/2015 03:48   I have  personally reviewed and evaluated these images and lab results as part of my medical decision-making.   EKG Interpretation   Date/Time:  Saturday July 27 2015 02:43:06 EST Ventricular Rate:  69 PR Interval:  110 QRS Duration: 99 QT Interval:  440 QTC Calculation: 471 R Axis:   52 Text Interpretation:  Sinus rhythm Borderline short PR interval Borderline  abnrm T, anterolateral leads Baseline wander in lead(s) I II aVR V1 V2 V3  V5 V6 When compared with ECG of 07/26/2014, Premature atrial complexes are  no longer Present Confirmed by [facility]  MD, Felix Meras ([id number]) on 07/27/2015  2:54:14 AM      MDM   Final diagnoses:  Chest pain, unspecified chest pain type  Normochromic normocytic anemia    Chest pain of uncertain cause. This pain had been constant and then got worse. ECG is unchanged from baseline and troponin is normal. She was given morphine with stated that relieve her pain. 2 troponins are negative and she has remained pain-free. Old records are reviewed and she had a non-STEMI with PTCA in 2015, stress Myoview in February 2016 which showed a low risk profile. She had been scheduled for an appointment with her cardiologist in December but had missed that appointment. Have discussed this with the patient. She is encouraged to make a follow-up appointment with her cardiologist this coming week. She is probably due for another stress Myoview. She is to return should symptoms recur.  I personally performed the services described in this documentation, which was scribed in my presence. The recorded information has been reviewed and is accurate.       Christine Fuel, MD [id number]

## 2015-07-27 NOTE — ED Notes (Addendum)
Report received from [facility]> Pt reports she normally has chest pain and difficulty breathing when she gets cold s/p angioplasty in 2015.  C/o sharp substernal chest pain with difficulty breathing since 3pm.  Around 1:30am pain worsened with radiation to L arm.  Denies nausea, vomiting, diaphoresis.  EMS administered NTG SL x 3.  Pain 2/10 on arrival.

## 2015-07-27 NOTE — ED Notes (Signed)
Dr Roxanne Mins in with patient at this time.

## 2015-07-27 NOTE — Discharge Instructions (Signed)
Return if you have any problems. Make sure to see your cardiologist this week.  Nonspecific Chest Pain  Chest pain can be caused by many different conditions. There is always a chance that your pain could be related to something serious, such as a heart attack or a blood clot in your lungs. Chest pain can also be caused by conditions that are not life-threatening. If you have chest pain, it is very important to follow up with your health care provider. CAUSES  Chest pain can be caused by:  Heartburn.  Pneumonia or bronchitis.  Anxiety or stress.  Inflammation around your heart (pericarditis) or lung (pleuritis or pleurisy).  A blood clot in your lung.  A collapsed lung (pneumothorax). It can develop suddenly on its own (spontaneous pneumothorax) or from trauma to the chest.  Shingles infection (varicella-zoster virus).  Heart attack.  Damage to the bones, muscles, and cartilage that make up your chest wall. This can include:  Bruised bones due to injury.  Strained muscles or cartilage due to frequent or repeated coughing or overwork.  Fracture to one or more ribs.  Sore cartilage due to inflammation (costochondritis). RISK FACTORS  Risk factors for chest pain may include:  Activities that increase your risk for trauma or injury to your chest.  Respiratory infections or conditions that cause frequent coughing.  Medical conditions or overeating that can cause heartburn.  Heart disease or family history of heart disease.  Conditions or health behaviors that increase your risk of developing a blood clot.  Having had chicken pox (varicella zoster). SIGNS AND SYMPTOMS Chest pain can feel like:  Burning or tingling on the surface of your chest or deep in your chest.  Crushing, pressure, aching, or squeezing pain.  Dull or sharp pain that is worse when you move, cough, or take a deep breath.  Pain that is also felt in your back, neck, shoulder, or arm, or pain that  spreads to any of these areas. Your chest pain may come and go, or it may stay constant. DIAGNOSIS Lab tests or other studies may be needed to find the cause of your pain. Your health care provider may have you take a test called an ambulatory ECG (electrocardiogram). An ECG records your heartbeat patterns at the time the test is performed. You may also have other tests, such as:  Transthoracic echocardiogram (TTE). During echocardiography, sound waves are used to create a picture of all of the heart structures and to look at how blood flows through your heart.  Transesophageal echocardiogram (TEE).This is a more advanced imaging test that obtains images from inside your body. It allows your health care provider to see your heart in finer detail.  Cardiac monitoring. This allows your health care provider to monitor your heart rate and rhythm in real time.  Holter monitor. This is a portable device that records your heartbeat and can help to diagnose abnormal heartbeats. It allows your health care provider to track your heart activity for several days, if needed.  Stress tests. These can be done through exercise or by taking medicine that makes your heart beat more quickly.  Blood tests.  Imaging tests. TREATMENT  Your treatment depends on what is causing your chest pain. Treatment may include:  Medicines. These may include:  Acid blockers for heartburn.  Anti-inflammatory medicine.  Pain medicine for inflammatory conditions.  Antibiotic medicine, if an infection is present.  Medicines to dissolve blood clots.  Medicines to treat coronary artery disease.  Supportive care  for conditions that do not require medicines. This may include:  Resting.  Applying heat or cold packs to injured areas.  Limiting activities until pain decreases. HOME CARE INSTRUCTIONS  If you were prescribed an antibiotic medicine, finish it all even if you start to feel better.  Avoid any activities  that bring on chest pain.  Do not use any tobacco products, including cigarettes, chewing tobacco, or electronic cigarettes. If you need help quitting, ask your health care provider.  Do not drink alcohol.  Take medicines only as directed by your health care provider.  Keep all follow-up visits as directed by your health care provider. This is important. This includes any further testing if your chest pain does not go away.  If heartburn is the cause for your chest pain, you may be told to keep your head raised (elevated) while sleeping. This reduces the chance that acid will go from your stomach into your esophagus.  Make lifestyle changes as directed by your health care provider. These may include:  Getting regular exercise. Ask your health care provider to suggest some activities that are safe for you.  Eating a heart-healthy diet. A registered dietitian can help you to learn healthy eating options.  Maintaining a healthy weight.  Managing diabetes, if necessary.  Reducing stress. SEEK MEDICAL CARE IF:  Your chest pain does not go away after treatment.  You have a rash with blisters on your chest.  You have a fever. SEEK IMMEDIATE MEDICAL CARE IF:   Your chest pain is worse.  You have an increasing cough, or you cough up blood.  You have severe abdominal pain.  You have severe weakness.  You faint.  You have chills.  You have sudden, unexplained chest discomfort.  You have sudden, unexplained discomfort in your arms, back, neck, or jaw.  You have shortness of breath at any time.  You suddenly start to sweat, or your skin gets clammy.  You feel nauseous or you vomit.  You suddenly feel light-headed or dizzy.  Your heart begins to beat quickly, or it feels like it is skipping beats. These symptoms may represent a serious problem that is an emergency. Do not wait to see if the symptoms will go away. Get medical help right away. Call your local emergency  services (911 in the [place] Do not drive yourself to the hospital.   This information is not intended to replace advice given to you by your health care provider. Make sure you discuss any questions you have with your health care provider.   Document Released: 03/25/2005 Document Revised: 07/06/2014 Document Reviewed: 01/19/2014 Elsevier Interactive Patient Education [employer].

## 2015-07-31 ENCOUNTER — Telehealth: Payer: Self-pay

## 2015-07-31 NOTE — Telephone Encounter (Signed)
Tried to call patient to verify her appt on Friday  If it is just for a blood pressure check she can be scheduled with stacey Patient not available Message left on voice mail to return our call

## 2015-08-01 NOTE — Progress Notes (Signed)
Cardiology Office Note   Date:  08/02/2015   ID:  Christine, Barrett September 20, 1960, MRN [id number]  PCP:  Lance Bosch, NP  Cardiologist:  Dr. Audrie Lia ER follow up- chest pain    History of Present Illness: Christine Barrett is a 55 y.o. female with a history of HTN, HLD, CAD: NSTEMI s/p PCTA of ost AV G LCx (09/2013) and migraines who presents to clinic for post hospital follow up after being seen in the [facility] ED for chest pain.   She presented with accelerated HTN and NSTEMI in 09/2013. She underwent LHC which revealed severe Ostial AV Groove Circumflex lesion with only moderate mid LAD stenosis. She underwent successful staged Scoring Balloon PTCA of the Ostial AV Groove Circumflex with a 2.5 mm AngioSculpt balloon reducing the 95% stenosis to < 30%. She was continued on aggressive BP control and DAPT. 2D ECHO: EF 55-60%; mild LVH, no RWMA.  She was seen in 07/2014 for SOB and Myoview was ordered which returned LOW RISK w/ small-severe fixed inferior defect c/w infarct w/o peri-infarct ischemia.  She was previoulsy followed by Dr Verl Blalock due to no having insurance. However, she started working and got insurance and established care with [facility] with Dr. Ellyn Hack. Last seen by Dr. Ellyn Hack in 11/2014 and doing well from a cardiac standpoint. She was no longer on DAPT and only taking ASA.   Seen in the ED on 07/27/15 for chest pain. She ruled out and remained chest pain free by the time she got to ER. Told to follow up as an outpatient.   Today she presents for follow up. Over the past few months, the cold weather has been making her feel SOB and have chest pain. Last Friday it was cold and she started having chest pain and SOB. The chest pain was dull and constant. She was very tired that day. She laid down and then had severe chest pain with radiation/numbness down her left arm similar to her previous heart attack. This scared her and she called EMS. She was given ASA, morphine and 3 SL NTG. By the  time she got to the ER she was chest pain free. She had one episode of of chest pain in her arm on Sunday and it went away on its own after a few hours. Lately she has just felt exhausted. She has been going to the gym twice a week since January and walking on the treadmill. No chest pain but she does have significant exertional SOB.    Past Medical History  Diagnosis Date  . Essential hypertension     with prior Accelerated HTN  . Hyperlipemia   . Hx of non-ST elevation myocardial infarction (NSTEMI) 10/01/2013    Due to Accelerated HTN with existing CAD  . CAD S/P percutaneous coronary angioplasty 09/2013    a) Ostial AV G Cx - 2.5 mm Angiosculpt; mid LAD 40-60%; b) Myoview 07/2014: LOW RISK, small-severe fixed inferior defect c/w infarct w/o peri-infarct ischemia.  . Chronic bronchitis ([place])     "frequently; not q yr" (10/03/2013)  . Migraine     "@ least once/month" (10/03/2013)  . Seasonal allergies   . Sinusitis     Past Surgical History  Procedure Laterality Date  . Cesarean section  1989  . Nasal septoplasty w/ turbinoplasty  ~ 2007  . Coronary angioplasty  10/03/2013    95% ostial AV G Cx - 2.5 mm AngioSculpt Balloon PTCA; mid LAD 40-60%  . Tubal  ligation  1989  . Abdominal hysterectomy  1994    "partial"  . Left heart catheterization with coronary angiogram N/A 10/02/2013    Procedure: LEFT HEART CATHETERIZATION WITH CORONARY ANGIOGRAM;  Surgeon: [name], MD;  Location: [facility] CATH LAB;  Service: Cardiovascular;  Laterality: N/A;  . Percutaneous coronary stent intervention (pci-s) N/A 10/03/2013    cutting balloon angioplasty only no stent.   . Transthoracic echocardiogram  10/02/2013    EF 55-60%; mild LVH, no RWMA,   . Nm myoview ltd  08/22/2014     Low risk stress nuclear study with a small, severe, fixed defect in the distal inferior wall/apex suggestive of small prior infarct; no ischemia.  LV Wall Motion: NL LV Function; NL Wall Motion     Current Outpatient Prescriptions    Medication Sig Dispense Refill  . acetaminophen (TYLENOL) 500 MG tablet Take 1,000 mg by mouth every 6 (six) hours as needed for moderate pain or headache.    [name] aspirin EC 81 MG EC tablet Take 1 tablet (81 mg total) by mouth daily.    [name] atorvastatin (LIPITOR) 40 MG tablet Take 40 mg by mouth daily.    . carvedilol (COREG) 25 MG tablet Take 1 tablet (25 mg total) by mouth 2 (two) times daily. 180 tablet 0  . cetirizine (ZYRTEC) 10 MG tablet Take 10 mg by mouth daily.    . fluticasone (FLONASE) 50 MCG/ACT nasal spray Place 1 spray into both nostrils 2 (two) times daily as needed for allergies or rhinitis.    [name] isosorbide mononitrate (IMDUR) 60 MG 24 hr tablet Take 1 tablet (60 mg total) by mouth daily. 30 tablet 0  . losartan (COZAAR) 100 MG tablet Take 1 tablet (100 mg total) by mouth daily. 90 tablet 0  . omeprazole (PRILOSEC) 20 MG capsule Take 20 mg by mouth as needed (REFLUX).    [name] amLODipine (NORVASC) 10 MG tablet Take 1 tablet (10 mg total) by mouth daily. 90 tablet 3   No current facility-administered medications for this visit.    Allergies:   Penicillins    Social History:  The patient  reports that she has never smoked. She has never used smokeless tobacco. She reports that she does not drink alcohol or use illicit drugs.   Family History:  The patient's family history includes Breast cancer in her sister; Diabetes in her mother; Hypertension in her sister. There is no history of Colon cancer, Heart attack, or Stroke. She was adopted.    ROS:  Please see the history of present illness.   Otherwise, review of systems are positive for NONE.   All other systems are reviewed and negative.    PHYSICAL EXAM: VS:  BP 148/90 mmHg  Pulse 69  Ht 5\' 9"  (1.753 m)  Wt 284 lb (128.822 kg)  BMI 41.92 kg/m2  SpO2 98% , BMI Body mass index is 41.92 kg/(m^2). GEN: Well nourished, well developed, in no acute distress HEENT: normal Neck: no JVD, carotid bruits, or masses Cardiac: RRR; no  murmurs, rubs, or gallops,no edema  Respiratory:  clear to auscultation bilaterally, normal work of breathing GI: soft, nontender, nondistended, + BS MS: no deformity or atrophy Skin: warm and dry, no rash Neuro:  Strength and sensation are intact Psych: euthymic mood, full affect   EKG:  EKG is ordered today. The ekg ordered today demonstrates NSR with sinus arrhythmia. Non specific TW flattening.     Recent Labs: 07/16/2015: ALT 16 07/27/2015: BUN 13; Creatinine,  Ser 0.72; Hemoglobin 11.7*; Platelets 254; Potassium 3.9; Sodium 140    Lipid Panel    Component Value Date/Time   CHOL 250* 07/16/2015 0920   TRIG 186* 07/16/2015 0920   HDL 40* 07/16/2015 0920   CHOLHDL 6.3* 07/16/2015 0920   VLDL 37* 07/16/2015 0920   LDLCALC 173* 07/16/2015 0920   LDLDIRECT 192.2 02/11/2009 1439      Wt Readings from Last 3 Encounters:  08/02/15 284 lb (128.822 kg)  07/27/15 285 lb (129.275 kg)  07/02/15 285 lb (129.275 kg)      Other studies Reviewed: Additional studies/ records that were reviewed today include: LHC Review of the above records demonstrates:   LHC: 10/03/13:  POST-OPERATIVE DIAGNOSIS:   Severe Ostial AV Groove Circumflex lesion with only moderate mid LAD stenosis.  Successful Scoring Balloon PTCA of the Ostial AV Groove Circumflex with a 2.5 mm AngioSculpt balloon reducing the 95% stenosis to < 30%.  PLAN OF CARE:  Return to [facility] for standard post-radial cath care.  Continue aggressive BP control - weaning NTG gtt overnight.  DAPT for minimum of 3 months - to 1 yr. Can convert to Plavix after 3 months.  Expect D/c in AM if BP controlled.    ASSESSMENT AND PLAN:  Mironda Aiello is a 55 y.o. female with a history of HTN, HLD, CAD: NSTEMI s/p PCTA of ost AV G LCx (09/2013) and migraines who presents to clinic for post hospital follow up after being seen in the [facility] ED for chest pain.   CAD S/P scoring balloon PCTA of Ostial AVG LCx (09/2013)/Chest  pain:  -- Negative myoview in 07/2014. Continue ASA, statin, BB and ARB. Chest pain /exertional dyspnea currently is worrisome for [place]. I will have her set up for LHC early next Monday with her primary cardiologist, Dr. Ellyn Hack.   I have reviewed the risks, indications, and alternatives to cardiac catheterization and possible angioplasty/stenting with the patient. Risks include but are not limited to bleeding, infection, vascular injury, stroke, myocardial infection, arrhythmia, kidney injury, radiation-related injury in the case of prolonged fluoroscopy use, emergency cardiac surgery, and death. The patient understands the risks of serious complication is low (123456).   HTN: BP 148/90. She is on amlodipine 5mg , Coreg 25mg  BID, Losartan 100mg  daily, imdur 60mg  daily. Patient said her BP has been difficult to control which is why she has started to exercise. I am going to increase amlodipine to 10mg  daily.   HLD: LDL way too high at 173 on 07/16/15 ( up from 101 7 months ago). She has not been taking her atorvastatin 80mg  due to muscle cramps. She is now back on atorva 40mg . This is probably not going to get her to her goal <70. Her PCP follows her lipids. If her LDL does not improve we may need to add zetia.   Current medicines are reviewed at length with the patient today.  The patient has concerns regarding medicines.  The following changes have been made:  Increase amlodipine 5mg  --> 10mg .   Labs/ tests ordered today include:   Orders Placed This Encounter  Procedures  . Basic Metabolic Panel (BMET)  . CBC w/Diff  . INR/PT  . EKG 12-Lead     Disposition:   FU with Dr. Ellyn Hack depending on cath results.   Renea Ee  08/02/2015 12:21 PM    [facility] [place], [place], [place]  [address] Phone: [phone]; Fax: [phone]

## 2015-08-02 ENCOUNTER — Ambulatory Visit: Payer: BLUE CROSS/BLUE SHIELD | Admitting: Internal Medicine

## 2015-08-02 ENCOUNTER — Other Ambulatory Visit: Payer: Self-pay | Admitting: Physician Assistant

## 2015-08-02 ENCOUNTER — Ambulatory Visit (INDEPENDENT_AMBULATORY_CARE_PROVIDER_SITE_OTHER): Payer: BLUE CROSS/BLUE SHIELD | Admitting: Physician Assistant

## 2015-08-02 ENCOUNTER — Encounter: Payer: Self-pay | Admitting: Physician Assistant

## 2015-08-02 ENCOUNTER — Encounter: Payer: Self-pay | Admitting: *Deleted

## 2015-08-02 VITALS — BP 148/90 | HR 69 | Ht 69.0 in | Wt 284.0 lb

## 2015-08-02 DIAGNOSIS — Z9861 Coronary angioplasty status: Secondary | ICD-10-CM | POA: Diagnosis not present

## 2015-08-02 DIAGNOSIS — I2510 Atherosclerotic heart disease of native coronary artery without angina pectoris: Secondary | ICD-10-CM

## 2015-08-02 DIAGNOSIS — R079 Chest pain, unspecified: Secondary | ICD-10-CM

## 2015-08-02 DIAGNOSIS — I10 Essential (primary) hypertension: Secondary | ICD-10-CM

## 2015-08-02 LAB — CBC WITH DIFFERENTIAL/PLATELET
BASOS PCT: 0 % (ref 0–1)
Basophils Absolute: 0 10*3/uL (ref 0.0–0.1)
Eosinophils Absolute: 0.3 10*3/uL (ref 0.0–0.7)
Eosinophils Relative: 4 % (ref 0–5)
HEMATOCRIT: 37.7 % (ref 36.0–46.0)
HEMOGLOBIN: 12 g/dL (ref 12.0–15.0)
LYMPHS ABS: 3.3 10*3/uL (ref 0.7–4.0)
LYMPHS PCT: 42 % (ref 12–46)
MCH: 27.3 pg (ref 26.0–34.0)
MCHC: 31.8 g/dL (ref 30.0–36.0)
MCV: 85.7 fL (ref 78.0–100.0)
MONOS PCT: 10 % (ref 3–12)
MPV: 11.9 fL (ref 8.6–12.4)
Monocytes Absolute: 0.8 10*3/uL (ref 0.1–1.0)
NEUTROS ABS: 3.5 10*3/uL (ref 1.7–7.7)
NEUTROS PCT: 44 % (ref 43–77)
Platelets: 257 10*3/uL (ref 150–400)
RBC: 4.4 MIL/uL (ref 3.87–5.11)
RDW: 14.2 % (ref 11.5–15.5)
WBC: 7.9 10*3/uL (ref 4.0–10.5)

## 2015-08-02 LAB — BASIC METABOLIC PANEL
BUN: 11 mg/dL (ref 7–25)
CALCIUM: 9.3 mg/dL (ref 8.6–10.4)
CHLORIDE: 106 mmol/L (ref 98–110)
CO2: 22 mmol/L (ref 20–31)
Creat: 0.7 mg/dL (ref 0.50–1.05)
Glucose, Bld: 88 mg/dL (ref 65–99)
POTASSIUM: 4 mmol/L (ref 3.5–5.3)
SODIUM: 140 mmol/L (ref 135–146)

## 2015-08-02 MED ORDER — AMLODIPINE BESYLATE 10 MG PO TABS: 10.0000 mg | ORAL_TABLET | Freq: Every day | ORAL | Status: DC

## 2015-08-02 NOTE — Patient Instructions (Addendum)
Medication Instructions:  Your physician has recommended you make the following change in your medication:  1.  INCREASE the Amlodipine to 10 mg taking 1 tablet daily   Labwork: TODAY:  BMET                 CBC W/DIFF                 PT/INR  Testing/Procedures: Your physician has requested that you have a cardiac catheterization. Cardiac catheterization is used to diagnose and/or treat various heart conditions. Doctors may recommend this procedure for a number of different reasons. The most common reason is to evaluate chest pain. Chest pain can be a symptom of coronary artery disease (CAD), and cardiac catheterization can show whether plaque is narrowing or blocking your heart's arteries. This procedure is also used to evaluate the valves, as well as measure the blood flow and oxygen levels in different parts of your heart. For further information please visit [email or web address]. Please follow instruction sheet, as given.    Follow-Up: Your physician recommends that you schedule a follow-up appointment in: WILL BE BASED UPON YOUR HEARTH CATH    Any Other Special Instructions Will Be Listed Below (If Applicable).   If you need a refill on your cardiac medications before your next appointment, please call your pharmacy. Coronary Angiogram A coronary angiogram, also called coronary angiography, is an X-ray procedure used to look at the arteries in the heart. In this procedure, a dye (contrast dye) is injected through a long, hollow tube (catheter). The catheter is about the size of a piece of cooked spaghetti and is inserted through your groin, wrist, or arm. The dye is injected into each artery, and X-rays are then taken to show if there is a blockage in the arteries of your heart. LET [facility] CARE PROVIDER KNOW ABOUT:  Any allergies you have, including allergies to shellfish or contrast dye.   All medicines you are taking, including vitamins, herbs, eye drops, creams, and  over-the-counter medicines.   Previous problems you or members of your family have had with the use of anesthetics.   Any blood disorders you have.   Previous surgeries you have had.  History of kidney problems or failure.   Other medical conditions you have. RISKS AND COMPLICATIONS  Generally, a coronary angiogram is a safe procedure. However, problems can occur and include:  Allergic reaction to the dye.  Bleeding from the access site or other locations.  Kidney injury, especially in people with impaired kidney function.  Stroke (rare).  Heart attack (rare). BEFORE THE PROCEDURE   Do not eat or drink anything after midnight the night before the procedure or as directed by your health care provider.   Ask your health care provider about changing or stopping your regular medicines. This is especially important if you are taking diabetes medicines or blood thinners. PROCEDURE  You may be given a medicine to help you relax (sedative) before the procedure. This medicine is given through an intravenous (IV) access tube that is inserted into one of your veins.   The area where the catheter will be inserted will be washed and shaved. This is usually done in the groin but may be done in the fold of your arm (near your elbow) or in the wrist.   A medicine will be given to numb the area where the catheter will be inserted (local anesthetic).   The health care provider will insert the catheter into an artery. The  catheter will be guided by using a special type of X-ray (fluoroscopy) of the blood vessel being examined.   A special dye will then be injected into the catheter, and X-rays will be taken. The dye will help to show where any narrowing or blockages are located in the heart arteries.  AFTER THE PROCEDURE   If the procedure is done through the leg, you will be kept in bed lying flat for several hours. You will be instructed to not bend or cross your legs.  The  insertion site will be checked frequently.   The pulse in your feet or wrist will be checked frequently.   Additional blood tests, X-rays, and an electrocardiogram may be done.    This information is not intended to replace advice given to you by your health care provider. Make sure you discuss any questions you have with your health care provider.   Document Released: 12/20/2002 Document Revised: 07/06/2014 Document Reviewed: 11/07/2012 Elsevier Interactive Patient Education [employer].

## 2015-08-03 LAB — PROTIME-INR
INR: 0.92 (ref <1.50)
PROTHROMBIN TIME: 12.5 s (ref 11.6–15.2)

## 2015-08-05 ENCOUNTER — Ambulatory Visit (HOSPITAL_COMMUNITY)
Admission: RE | Admit: 2015-08-05 | Discharge: 2015-08-05 | Disposition: A | Discharge status: Home / Self Care | Payer: BLUE CROSS/BLUE SHIELD | Source: Ambulatory Visit | Attending: Cardiology | Admitting: Cardiology

## 2015-08-05 ENCOUNTER — Encounter (HOSPITAL_COMMUNITY): Payer: Self-pay | Admitting: Cardiology

## 2015-08-05 ENCOUNTER — Encounter (HOSPITAL_COMMUNITY)
Admission: RE | Disposition: A | Discharge status: Home / Self Care | Payer: Self-pay | Source: Ambulatory Visit | Attending: Cardiology

## 2015-08-05 DIAGNOSIS — I10 Essential (primary) hypertension: Secondary | ICD-10-CM

## 2015-08-05 DIAGNOSIS — E785 Hyperlipidemia, unspecified: Secondary | ICD-10-CM | POA: Diagnosis present

## 2015-08-05 DIAGNOSIS — G43909 Migraine, unspecified, not intractable, without status migrainosus: Secondary | ICD-10-CM | POA: Diagnosis not present

## 2015-08-05 DIAGNOSIS — Z7982 Long term (current) use of aspirin: Secondary | ICD-10-CM | POA: Diagnosis not present

## 2015-08-05 DIAGNOSIS — R0609 Other forms of dyspnea: Secondary | ICD-10-CM | POA: Diagnosis present

## 2015-08-05 DIAGNOSIS — T82855A Stenosis of coronary artery stent, initial encounter: Secondary | ICD-10-CM | POA: Insufficient documentation

## 2015-08-05 DIAGNOSIS — I152 Hypertension secondary to endocrine disorders: Secondary | ICD-10-CM | POA: Diagnosis present

## 2015-08-05 DIAGNOSIS — I25119 Atherosclerotic heart disease of native coronary artery with unspecified angina pectoris: Secondary | ICD-10-CM

## 2015-08-05 DIAGNOSIS — I252 Old myocardial infarction: Secondary | ICD-10-CM

## 2015-08-05 DIAGNOSIS — Y712 Prosthetic and other implants, materials and accessory cardiovascular devices associated with adverse incidents: Secondary | ICD-10-CM | POA: Insufficient documentation

## 2015-08-05 DIAGNOSIS — Z8249 Family history of ischemic heart disease and other diseases of the circulatory system: Secondary | ICD-10-CM | POA: Insufficient documentation

## 2015-08-05 DIAGNOSIS — Z880 Allergy status to penicillin: Secondary | ICD-10-CM | POA: Insufficient documentation

## 2015-08-05 DIAGNOSIS — I2510 Atherosclerotic heart disease of native coronary artery without angina pectoris: Secondary | ICD-10-CM

## 2015-08-05 DIAGNOSIS — R0600 Dyspnea, unspecified: Secondary | ICD-10-CM | POA: Diagnosis present

## 2015-08-05 DIAGNOSIS — I200 Unstable angina: Secondary | ICD-10-CM | POA: Diagnosis present

## 2015-08-05 DIAGNOSIS — Z9861 Coronary angioplasty status: Secondary | ICD-10-CM

## 2015-08-05 HISTORY — PX: CARDIAC CATHETERIZATION: SHX172

## 2015-08-05 SURGERY — LEFT HEART CATH AND CORONARY ANGIOGRAPHY
Anesthesia: LOCAL

## 2015-08-05 MED ORDER — VERAPAMIL HCL 2.5 MG/ML IV SOLN
INTRAVENOUS | Status: AC
  Filled 2015-08-05: qty 2

## 2015-08-05 MED ORDER — LIDOCAINE HCL (PF) 1 % IJ SOLN
INTRAMUSCULAR | Status: AC
  Filled 2015-08-05: qty 30

## 2015-08-05 MED ORDER — SODIUM CHLORIDE 0.9% FLUSH: 3.0000 mL | Freq: Two times a day (BID) | INTRAVENOUS | Status: DC

## 2015-08-05 MED ORDER — ACETAMINOPHEN 325 MG PO TABS: 650.0000 mg | ORAL_TABLET | ORAL | Status: DC | PRN

## 2015-08-05 MED ORDER — AMLODIPINE BESYLATE 5 MG PO TABS
10.0000 mg | ORAL_TABLET | Freq: Every day | ORAL | Status: DC
  Administered 2015-08-05: 10 mg via ORAL

## 2015-08-05 MED ORDER — CARVEDILOL 12.5 MG PO TABS
25.0000 mg | ORAL_TABLET | Freq: Two times a day (BID) | ORAL | Status: DC
  Administered 2015-08-05: 25 mg via ORAL
  Filled 2015-08-05: qty 2

## 2015-08-05 MED ORDER — SODIUM CHLORIDE 0.9 % WEIGHT BASED INFUSION
3.0000 mL/kg/h | INTRAVENOUS | Status: DC
  Administered 2015-08-05: 3 mL/kg/h via INTRAVENOUS

## 2015-08-05 MED ORDER — MORPHINE SULFATE (PF) 2 MG/ML IV SOLN: 2.0000 mg | INTRAVENOUS | Status: DC | PRN

## 2015-08-05 MED ORDER — ASPIRIN 81 MG PO CHEW
CHEWABLE_TABLET | ORAL | Status: AC
  Administered 2015-08-05: 81 mg via ORAL
  Filled 2015-08-05: qty 1

## 2015-08-05 MED ORDER — MIDAZOLAM HCL 2 MG/2ML IJ SOLN
INTRAMUSCULAR | Status: DC | PRN
  Administered 2015-08-05: 2 mg via INTRAVENOUS
  Administered 2015-08-05: 1 mg via INTRAVENOUS

## 2015-08-05 MED ORDER — NITROGLYCERIN 1 MG/10 ML FOR IR/CATH LAB
INTRA_ARTERIAL | Status: DC | PRN
Start: 2015-08-05 — End: 2015-08-05
  Administered 2015-08-05: 20 mL via INTRA_ARTERIAL

## 2015-08-05 MED ORDER — IOHEXOL 350 MG/ML SOLN
INTRAVENOUS | Status: DC | PRN
  Administered 2015-08-05: 100 mL via INTRA_ARTERIAL

## 2015-08-05 MED ORDER — FENTANYL CITRATE (PF) 100 MCG/2ML IJ SOLN
INTRAMUSCULAR | Status: AC
  Filled 2015-08-05: qty 2

## 2015-08-05 MED ORDER — ASPIRIN 81 MG PO CHEW
81.0000 mg | CHEWABLE_TABLET | ORAL | Status: AC
  Administered 2015-08-05: 81 mg via ORAL

## 2015-08-05 MED ORDER — ONDANSETRON HCL 4 MG/2ML IJ SOLN: 4.0000 mg | Freq: Four times a day (QID) | INTRAMUSCULAR | Status: DC | PRN

## 2015-08-05 MED ORDER — SODIUM CHLORIDE 0.9 % WEIGHT BASED INFUSION: 1.0000 mL/kg/h | INTRAVENOUS | Status: DC

## 2015-08-05 MED ORDER — SODIUM CHLORIDE 0.9 % IV SOLN: 250.0000 mL | INTRAVENOUS | Status: DC | PRN

## 2015-08-05 MED ORDER — SODIUM CHLORIDE 0.9% FLUSH: 3.0000 mL | INTRAVENOUS | Status: DC | PRN

## 2015-08-05 MED ORDER — NITROGLYCERIN 1 MG/10 ML FOR IR/CATH LAB
INTRA_ARTERIAL | Status: DC | PRN
Start: 2015-08-05 — End: 2015-08-05
  Administered 2015-08-05: 200 ug via INTRACORONARY

## 2015-08-05 MED ORDER — HEPARIN SODIUM (PORCINE) 1000 UNIT/ML IJ SOLN
INTRAMUSCULAR | Status: DC | PRN
Start: 2015-08-05 — End: 2015-08-05
  Administered 2015-08-05: 6000 [IU] via INTRAVENOUS

## 2015-08-05 MED ORDER — AMLODIPINE BESYLATE 5 MG PO TABS
ORAL_TABLET | ORAL | Status: AC
  Filled 2015-08-05: qty 1

## 2015-08-05 MED ORDER — HEPARIN SODIUM (PORCINE) 1000 UNIT/ML IJ SOLN
INTRAMUSCULAR | Status: AC
  Filled 2015-08-05: qty 1

## 2015-08-05 MED ORDER — VERAPAMIL HCL 2.5 MG/ML IV SOLN
INTRAVENOUS | Status: DC | PRN
  Administered 2015-08-05: 10 mL via INTRA_ARTERIAL

## 2015-08-05 MED ORDER — MIDAZOLAM HCL 2 MG/2ML IJ SOLN
INTRAMUSCULAR | Status: AC
  Filled 2015-08-05: qty 2

## 2015-08-05 MED ORDER — ISOSORBIDE MONONITRATE ER 120 MG PO TB24: 120.0000 mg | ORAL_TABLET | Freq: Every day | ORAL | Status: DC

## 2015-08-05 MED ORDER — AMLODIPINE BESYLATE 5 MG PO TABS
ORAL_TABLET | ORAL | Status: AC
Start: 2015-08-05 — End: 2015-08-05
  Administered 2015-08-05: 10 mg via ORAL
  Filled 2015-08-05: qty 1

## 2015-08-05 MED ORDER — LIDOCAINE HCL (PF) 1 % IJ SOLN
INTRAMUSCULAR | Status: DC | PRN
  Administered 2015-08-05: 1 mL
  Administered 2015-08-05: 3 mL

## 2015-08-05 MED ORDER — NITROGLYCERIN 1 MG/10 ML FOR IR/CATH LAB
INTRA_ARTERIAL | Status: AC
  Filled 2015-08-05: qty 10

## 2015-08-05 MED ORDER — SODIUM CHLORIDE 0.9 % WEIGHT BASED INFUSION: 3.0000 mL/kg/h | INTRAVENOUS | Status: DC

## 2015-08-05 MED ORDER — HEPARIN (PORCINE) IN NACL 2-0.9 UNIT/ML-% IJ SOLN
INTRAMUSCULAR | Status: AC
  Filled 2015-08-05: qty 1000

## 2015-08-05 MED ORDER — FENTANYL CITRATE (PF) 100 MCG/2ML IJ SOLN
INTRAMUSCULAR | Status: DC | PRN
  Administered 2015-08-05 (×3): 25 ug via INTRAVENOUS

## 2015-08-05 SURGICAL SUPPLY — 10 items
CATH IMPULSE 5F ANG/FL3.5 (CATHETERS) ×1 IMPLANT
DEVICE RAD COMP TR BAND LRG (VASCULAR PRODUCTS) ×2 IMPLANT
GLIDESHEATH SLEND A-KIT 6F 22G (SHEATH) ×2 IMPLANT
GLIDESHEATH SLEND SS 6F .021 (SHEATH) ×1 IMPLANT
KIT HEART LEFT (KITS) ×2 IMPLANT
PACK CARDIAC CATHETERIZATION (CUSTOM PROCEDURE TRAY) ×2 IMPLANT
SYR MEDRAD MARK V 150ML (SYRINGE) ×2 IMPLANT
TRANSDUCER W/STOPCOCK (MISCELLANEOUS) ×2 IMPLANT
TUBING CIL FLEX 10 FLL-RA (TUBING) ×2 IMPLANT
WIRE SAFE-T 1.5MM-J .035X260CM (WIRE) ×2 IMPLANT

## 2015-08-05 NOTE — H&P (View-Only) (Signed)
Cardiology Office Note   Date:  08/02/2015   ID:  Christine Barrett, Christine Barrett 23-Mar-1961, MRN [id number]  PCP:  Lance Bosch, NP  Cardiologist:  Dr. Audrie Lia ER follow up- chest pain    History of Present Illness: Christine Barrett is a 55 y.o. female with a history of HTN, HLD, CAD: NSTEMI s/p PCTA of ost AV G LCx (09/2013) and migraines who presents to clinic for post hospital follow up after being seen in the [facility] ED for chest pain.   She presented with accelerated HTN and NSTEMI in 09/2013. She underwent LHC which revealed severe Ostial AV Groove Circumflex lesion with only moderate mid LAD stenosis. She underwent successful staged Scoring Balloon PTCA of the Ostial AV Groove Circumflex with a 2.5 mm AngioSculpt balloon reducing the 95% stenosis to < 30%. She was continued on aggressive BP control and DAPT. 2D ECHO: EF 55-60%; mild LVH, no RWMA.  She was seen in 07/2014 for SOB and Myoview was ordered which returned LOW RISK w/ small-severe fixed inferior defect c/w infarct w/o peri-infarct ischemia.  She was previoulsy followed by Dr Verl Blalock due to no having insurance. However, she started working and got insurance and established care with [facility] with Dr. Ellyn Hack. Last seen by Dr. Ellyn Hack in 11/2014 and doing well from a cardiac standpoint. She was no longer on DAPT and only taking ASA.   Seen in the ED on 07/27/15 for chest pain. She ruled out and remained chest pain free by the time she got to ER. Told to follow up as an outpatient.   Today she presents for follow up. Over the past few months, the cold weather has been making her feel SOB and have chest pain. Last Friday it was cold and she started having chest pain and SOB. The chest pain was dull and constant. She was very tired that day. She laid down and then had severe chest pain with radiation/numbness down her left arm similar to her previous heart attack. This scared her and she called EMS. She was given ASA, morphine and 3 SL NTG. By the  time she got to the ER she was chest pain free. She had one episode of of chest pain in her arm on Sunday and it went away on its own after a few hours. Lately she has just felt exhausted. She has been going to the gym twice a week since January and walking on the treadmill. No chest pain but she does have significant exertional SOB.    Past Medical History  Diagnosis Date  . Essential hypertension     with prior Accelerated HTN  . Hyperlipemia   . Hx of non-ST elevation myocardial infarction (NSTEMI) 10/01/2013    Due to Accelerated HTN with existing CAD  . CAD S/P percutaneous coronary angioplasty 09/2013    a) Ostial AV G Cx - 2.5 mm Angiosculpt; mid LAD 40-60%; b) Myoview 07/2014: LOW RISK, small-severe fixed inferior defect c/w infarct w/o peri-infarct ischemia.  . Chronic bronchitis ([place])     "frequently; not q yr" (10/03/2013)  . Migraine     "@ least once/month" (10/03/2013)  . Seasonal allergies   . Sinusitis     Past Surgical History  Procedure Laterality Date  . Cesarean section  1989  . Nasal septoplasty w/ turbinoplasty  ~ 2007  . Coronary angioplasty  10/03/2013    95% ostial AV G Cx - 2.5 mm AngioSculpt Balloon PTCA; mid LAD 40-60%  . Tubal  ligation  1989  . Abdominal hysterectomy  1994    "partial"  . Left heart catheterization with coronary angiogram N/A 10/02/2013    Procedure: LEFT HEART CATHETERIZATION WITH CORONARY ANGIOGRAM;  Surgeon: [name], MD;  Location: [facility] CATH LAB;  Service: Cardiovascular;  Laterality: N/A;  . Percutaneous coronary stent intervention (pci-s) N/A 10/03/2013    cutting balloon angioplasty only no stent.   . Transthoracic echocardiogram  10/02/2013    EF 55-60%; mild LVH, no RWMA,   . Nm myoview ltd  08/22/2014     Low risk stress nuclear study with a small, severe, fixed defect in the distal inferior wall/apex suggestive of small prior infarct; no ischemia.  LV Wall Motion: NL LV Function; NL Wall Motion     Current Outpatient Prescriptions    Medication Sig Dispense Refill  . acetaminophen (TYLENOL) 500 MG tablet Take 1,000 mg by mouth every 6 (six) hours as needed for moderate pain or headache.    [name] aspirin EC 81 MG EC tablet Take 1 tablet (81 mg total) by mouth daily.    [name] atorvastatin (LIPITOR) 40 MG tablet Take 40 mg by mouth daily.    . carvedilol (COREG) 25 MG tablet Take 1 tablet (25 mg total) by mouth 2 (two) times daily. 180 tablet 0  . cetirizine (ZYRTEC) 10 MG tablet Take 10 mg by mouth daily.    . fluticasone (FLONASE) 50 MCG/ACT nasal spray Place 1 spray into both nostrils 2 (two) times daily as needed for allergies or rhinitis.    [name] isosorbide mononitrate (IMDUR) 60 MG 24 hr tablet Take 1 tablet (60 mg total) by mouth daily. 30 tablet 0  . losartan (COZAAR) 100 MG tablet Take 1 tablet (100 mg total) by mouth daily. 90 tablet 0  . omeprazole (PRILOSEC) 20 MG capsule Take 20 mg by mouth as needed (REFLUX).    [name] amLODipine (NORVASC) 10 MG tablet Take 1 tablet (10 mg total) by mouth daily. 90 tablet 3   No current facility-administered medications for this visit.    Allergies:   Penicillins    Social History:  The patient  reports that she has never smoked. She has never used smokeless tobacco. She reports that she does not drink alcohol or use illicit drugs.   Family History:  The patient's family history includes Breast cancer in her sister; Diabetes in her mother; Hypertension in her sister. There is no history of Colon cancer, Heart attack, or Stroke. She was adopted.    ROS:  Please see the history of present illness.   Otherwise, review of systems are positive for NONE.   All other systems are reviewed and negative.    PHYSICAL EXAM: VS:  BP 148/90 mmHg  Pulse 69  Ht 5\' 9"  (1.753 m)  Wt 284 lb (128.822 kg)  BMI 41.92 kg/m2  SpO2 98% , BMI Body mass index is 41.92 kg/(m^2). GEN: Well nourished, well developed, in no acute distress HEENT: normal Neck: no JVD, carotid bruits, or masses Cardiac: RRR; no  murmurs, rubs, or gallops,no edema  Respiratory:  clear to auscultation bilaterally, normal work of breathing GI: soft, nontender, nondistended, + BS MS: no deformity or atrophy Skin: warm and dry, no rash Neuro:  Strength and sensation are intact Psych: euthymic mood, full affect   EKG:  EKG is ordered today. The ekg ordered today demonstrates NSR with sinus arrhythmia. Non specific TW flattening.     Recent Labs: 07/16/2015: ALT 16 07/27/2015: BUN 13; Creatinine,  Ser 0.72; Hemoglobin 11.7*; Platelets 254; Potassium 3.9; Sodium 140    Lipid Panel    Component Value Date/Time   CHOL 250* 07/16/2015 0920   TRIG 186* 07/16/2015 0920   HDL 40* 07/16/2015 0920   CHOLHDL 6.3* 07/16/2015 0920   VLDL 37* 07/16/2015 0920   LDLCALC 173* 07/16/2015 0920   LDLDIRECT 192.2 02/11/2009 1439      Wt Readings from Last 3 Encounters:  08/02/15 284 lb (128.822 kg)  07/27/15 285 lb (129.275 kg)  07/02/15 285 lb (129.275 kg)      Other studies Reviewed: Additional studies/ records that were reviewed today include: LHC Review of the above records demonstrates:   LHC: 10/03/13:  POST-OPERATIVE DIAGNOSIS:   Severe Ostial AV Groove Circumflex lesion with only moderate mid LAD stenosis.  Successful Scoring Balloon PTCA of the Ostial AV Groove Circumflex with a 2.5 mm AngioSculpt balloon reducing the 95% stenosis to < 30%.  PLAN OF CARE:  Return to [facility] for standard post-radial cath care.  Continue aggressive BP control - weaning NTG gtt overnight.  DAPT for minimum of 3 months - to 1 yr. Can convert to Plavix after 3 months.  Expect D/c in AM if BP controlled.    ASSESSMENT AND PLAN:  Lorilee Waldie is a 55 y.o. female with a history of HTN, HLD, CAD: NSTEMI s/p PCTA of ost AV G LCx (09/2013) and migraines who presents to clinic for post hospital follow up after being seen in the [facility] ED for chest pain.   CAD S/P scoring balloon PCTA of Ostial AVG LCx (09/2013)/Chest  pain:  -- Negative myoview in 07/2014. Continue ASA, statin, BB and ARB. Chest pain /exertional dyspnea currently is worrisome for [place]. I will have her set up for LHC early next Monday with her primary cardiologist, Dr. Ellyn Hack.   I have reviewed the risks, indications, and alternatives to cardiac catheterization and possible angioplasty/stenting with the patient. Risks include but are not limited to bleeding, infection, vascular injury, stroke, myocardial infection, arrhythmia, kidney injury, radiation-related injury in the case of prolonged fluoroscopy use, emergency cardiac surgery, and death. The patient understands the risks of serious complication is low (123456).   HTN: BP 148/90. She is on amlodipine 5mg , Coreg 25mg  BID, Losartan 100mg  daily, imdur 60mg  daily. Patient said her BP has been difficult to control which is why she has started to exercise. I am going to increase amlodipine to 10mg  daily.   HLD: LDL way too high at 173 on 07/16/15 ( up from 101 7 months ago). She has not been taking her atorvastatin 80mg  due to muscle cramps. She is now back on atorva 40mg . This is probably not going to get her to her goal <70. Her PCP follows her lipids. If her LDL does not improve we may need to add zetia.   Current medicines are reviewed at length with the patient today.  The patient has concerns regarding medicines.  The following changes have been made:  Increase amlodipine 5mg  --> 10mg .   Labs/ tests ordered today include:   Orders Placed This Encounter  Procedures  . Basic Metabolic Panel (BMET)  . CBC w/Diff  . INR/PT  . EKG 12-Lead     Disposition:   FU with Dr. Ellyn Hack depending on cath results.   Renea Ee  08/02/2015 12:21 PM    [facility] [place], [place], [place]  [address] Phone: [phone]; Fax: [phone]

## 2015-08-05 NOTE — Discharge Instructions (Signed)
Radial Site Care °Refer to this sheet in the next few weeks. These instructions provide you with information about caring for yourself after your procedure. Your health care provider may also give you more specific instructions. Your treatment has been planned according to current medical practices, but problems sometimes occur. Call your health care provider if you have any problems or questions after your procedure. °WHAT TO EXPECT AFTER THE PROCEDURE °After your procedure, it is typical to have the following: °· Bruising at the radial site that usually fades within 1-2 weeks. °· Blood collecting in the tissue (hematoma) that may be painful to the touch. It should usually decrease in size and tenderness within 1-2 weeks. °HOME CARE INSTRUCTIONS °· Take medicines only as directed by your health care provider. °· You may shower 24-48 hours after the procedure or as directed by your health care provider. Remove the bandage (dressing) and gently wash the site with plain soap and water. Pat the area dry with a clean towel. Do not rub the site, because this may cause bleeding. °· Do not take baths, swim, or use a hot tub until your health care provider approves. °· Check your insertion site every day for redness, swelling, or drainage. °· Do not apply powder or lotion to the site. °· Do not flex or bend the affected arm for 24 hours or as directed by your health care provider. °· Do not push or pull heavy objects with the affected arm for 24 hours or as directed by your health care provider. °· Do not lift over 10 lb (4.5 kg) for 5 days after your procedure or as directed by your health care provider. °· Ask your health care provider when it is okay to: °¨ Return to work or school. °¨ Resume usual physical activities or sports. °¨ Resume sexual activity. °· Do not drive home if you are discharged the same day as the procedure. Have someone else drive you. °· You may drive 24 hours after the procedure unless otherwise  instructed by your health care provider. °· Do not operate machinery or power tools for 24 hours after the procedure. °· If your procedure was done as an outpatient procedure, which means that you went home the same day as your procedure, a responsible adult should be with you for the first 24 hours after you arrive home. °· Keep all follow-up visits as directed by your health care provider. This is important. °SEEK MEDICAL CARE IF: °· You have a fever. °· You have chills. °· You have increased bleeding from the radial site. Hold pressure on the site and call 911. °SEEK IMMEDIATE MEDICAL CARE IF: °· You have unusual pain at the radial site. °· You have redness, warmth, or swelling at the radial site. °· You have drainage (other than a small amount of blood on the dressing) from the radial site. °· The radial site is bleeding, and the bleeding does not stop after 30 minutes of holding steady pressure on the site. °· Your arm or hand becomes pale, cool, tingly, or numb. °  °This information is not intended to replace advice given to you by your health care provider. Make sure you discuss any questions you have with your health care provider. °  °Document Released: 07/18/2010 Document Revised: 07/06/2014 Document Reviewed: 01/01/2014 °Elsevier Interactive Patient Education ©2016 Elsevier Inc. ° °

## 2015-08-05 NOTE — Interval H&P Note (Signed)
History and Physical Interval Note:  08/05/2015 8:49 AM  Christine Barrett  has presented today for surgery, with the diagnosis of Chest pain - concerning for Crescendo Angina.   The various methods of treatment have been discussed with the patient and family. After consideration of risks, benefits and other options for treatment, the patient has consented to  Procedure(s): Left Heart Cath and Coronary Angiography (N/A)  And possible Percutaneous Coronary Intervention as a surgical intervention .  The patient's history has been reviewed, patient examined, no change in status, stable for surgery.  I have reviewed the patient's chart and labs.  Questions were answered to the patient's satisfaction.     Cath Lab Visit (complete for each Cath Lab visit)  Clinical Evaluation Leading to the Procedure:   ACS: No.  Non-ACS:    Anginal Classification: CCS II  Anti-ischemic medical therapy: Maximal Therapy (2 or more classes of medications)  Non-Invasive Test Results: No non-invasive testing performed  Prior CABG: No previous CABG  Unable to calculate AUC  HARDING, DAVID W

## 2015-08-10 NOTE — Progress Notes (Signed)
Cardiology Office Note   Date:  08/13/2015   ID:  Christine Barrett, DOB 03-Mar-1961, MRN [id number]  PCP:  Lance Bosch, NP  Cardiologist:  Dr. Cora Collum follow-up   History of Present Illness: Christine Barrett is a 55 y.o. female with a history of HTN, HLD, CAD: NSTEMI s/p PCTA of ost AV G LCx (09/2013) and migraines who presents to clinic for post hospital follow up after recent outpatient cardiac catheterization.  She presented with accelerated HTN and NSTEMI in 09/2013. She underwent LHC which revealed severe Ostial AV Groove Circumflex lesion with only moderate mid LAD stenosis. She underwent successful staged Scoring Balloon PTCA of the Ostial AV Groove Circumflex with a 2.5 mm AngioSculpt balloon reducing the 95% stenosis to < 30%. She was continued on aggressive BP control and DAPT. 2D ECHO: EF 55-60%; mild LVH, no RWMA.  She was seen in 07/2014 for SOB and Myoview was ordered which returned LOW RISK w/ small-severe fixed inferior defect c/w infarct w/o peri-infarct ischemia.  She was previoulsy followed by Dr Verl Blalock due to no having insurance. However, she started working and got insurance and established care with [facility] with Dr. Ellyn Hack. Last seen by Dr. Ellyn Hack in 11/2014 and doing well from a cardiac standpoint. She was no longer on DAPT and only taking ASA.   Seen in the ED on 07/27/15 for chest pain. She ruled out and remained chest pain free by the time she got to ER. Told to follow up as an outpatient.   I saw her in the clinic on 08/02/15. She complained of chest pain that was similar to the symptoms prior to her previous heart attack as well as significant exertional shortness of breath. I set her up for left heart cath on 08/05/15 which showed essentially stable CAD as compared to last catheterization. The PTCA site did have some restenosis but only 50% and a very focal segment. Potential culprit was felt to be the diagonal lesion that was not amenable to PCI. Dr. Ellyn Hack felt  that if the patient were to have worsening symptoms he would recommend noninvasive stress testing to evaluate LAD which appeared stable. FFR and LV gram were not performed due to concern for radial/brachial spasm. He increased her dose of Imdur to 120 mg and added that he would consider adding a diuretic.  Today she presents to clinic for posthospital follow-up. She thinks that the chest pain is maybe a little better than previous but she is still having SOB. She did have some chest pain this AM. A slight pressure in between her breasts. This is worse when the weather is cold. No LE edema, orthopnea or PND. Feeling very tired. She sometimes gets a little dizzy but no syncope.    Past Medical History  Diagnosis Date  . Essential hypertension     with prior Accelerated HTN  . Hyperlipemia   . Hx of non-ST elevation myocardial infarction (NSTEMI) 10/01/2013    Due to Accelerated HTN with existing CAD  . CAD S/P percutaneous coronary angioplasty 09/2013    a) Ostial AV G Cx - 2.5 mm Angiosculpt; mid LAD 40-60%; b) Myoview 07/2014: LOW RISK, small-severe fixed inferior defect c/w infarct w/o peri-infarct ischemia.  . Chronic bronchitis ([place])     "frequently; not q yr" (10/03/2013)  . Migraine     "@ least once/month" (10/03/2013)  . Seasonal allergies   . Sinusitis     Past Surgical History  Procedure Laterality Date  .  Cesarean section  1989  . Nasal septoplasty w/ turbinoplasty  ~ 2007  . Coronary angioplasty  10/03/2013    95% ostial AV G Cx - 2.5 mm AngioSculpt Balloon PTCA; mid LAD 40-60%  . Tubal ligation  1989  . Abdominal hysterectomy  1994    "partial"  . Left heart catheterization with coronary angiogram N/A 10/02/2013    Procedure: LEFT HEART CATHETERIZATION WITH CORONARY ANGIOGRAM;  Surgeon: [name], MD;  Location: [facility] CATH LAB;  Service: Cardiovascular;  Laterality: N/A;  . Percutaneous coronary stent intervention (pci-s) N/A 10/03/2013    cutting balloon angioplasty only no stent.    . Transthoracic echocardiogram  10/02/2013    EF 55-60%; mild LVH, no RWMA,   . Nm myoview ltd  08/22/2014     Low risk stress nuclear study with a small, severe, fixed defect in the distal inferior wall/apex suggestive of small prior infarct; no ischemia.  LV Wall Motion: NL LV Function; NL Wall Motion  . Cardiac catheterization N/A 08/05/2015    Procedure: Left Heart Cath and Coronary Angiography;  Surgeon: [name], MD;  Location: [facility] CV LAB;  Service: Cardiovascular;  Laterality: N/A;     Current Outpatient Prescriptions  Medication Sig Dispense Refill  . acetaminophen (TYLENOL) 500 MG tablet Take 1,000 mg by mouth every 6 (six) hours as needed for moderate pain or headache.    [name] amLODipine (NORVASC) 10 MG tablet Take 1 tablet (10 mg total) by mouth daily. 90 tablet 3  . aspirin EC 81 MG EC tablet Take 1 tablet (81 mg total) by mouth daily.    [name] atorvastatin (LIPITOR) 40 MG tablet Take 40 mg by mouth daily.    . carvedilol (COREG) 25 MG tablet Take 1 tablet (25 mg total) by mouth 2 (two) times daily. 180 tablet 0  . cetirizine (ZYRTEC) 10 MG tablet Take 10 mg by mouth daily.    . fluticasone (FLONASE) 50 MCG/ACT nasal spray Place 1 spray into both nostrils 2 (two) times daily as needed for allergies or rhinitis.    [name] isosorbide mononitrate (IMDUR) 120 MG 24 hr tablet Take 1 tablet (120 mg total) by mouth daily. 90 tablet 4  . losartan (COZAAR) 100 MG tablet Take 1 tablet (100 mg total) by mouth daily. 90 tablet 0  . omeprazole (PRILOSEC) 20 MG capsule Take 20 mg by mouth as needed (REFLUX).     No current facility-administered medications for this visit.    Allergies:   Penicillins    Social History:  The patient  reports that she has never smoked. She has never used smokeless tobacco. She reports that she does not drink alcohol or use illicit drugs.   Family History:  The patient's family history includes Breast cancer in her sister; Diabetes in her mother;  Hypertension in her sister. There is no history of Colon cancer, Heart attack, or Stroke. She was adopted.    ROS:  Please see the history of present illness.   Otherwise, review of systems are positive for NONE.   All other systems are reviewed and negative.    PHYSICAL EXAM: VS:  BP 138/84 mmHg  Pulse 80  Ht 5\' 10"  (1.778 m)  Wt 284 lb 1.9 oz (128.876 kg)  BMI 40.77 kg/m2 , BMI Body mass index is 40.77 kg/(m^2). GEN: Well nourished, well developed, in no acute distress HEENT: normal Neck: no JVD, carotid bruits, or masses Cardiac: RRR; no murmurs, rubs, or gallops,no edema  Respiratory:  clear to auscultation bilaterally, normal work of breathing GI: soft, nontender, nondistended, + BS MS: no deformity or atrophy Skin: warm and dry, no rash Neuro:  Strength and sensation are intact Psych: euthymic mood, full affect   EKG:  EKG is ordered today. The ekg ordered today demonstrates NSR with sinus arrhythmia. Non specific TW flattening.     Recent Labs: 07/16/2015: ALT 16 08/02/2015: BUN 11; Creat 0.70; Hemoglobin 12.0; Platelets 257; Potassium 4.0; Sodium 140    Lipid Panel    Component Value Date/Time   CHOL 250* 07/16/2015 0920   TRIG 186* 07/16/2015 0920   HDL 40* 07/16/2015 0920   CHOLHDL 6.3* 07/16/2015 0920   VLDL 37* 07/16/2015 0920   LDLCALC 173* 07/16/2015 0920   LDLDIRECT 192.2 02/11/2009 1439      Wt Readings from Last 3 Encounters:  08/13/15 284 lb 1.9 oz (128.876 kg)  08/05/15 285 lb (129.275 kg)  08/02/15 284 lb (128.822 kg)      Other studies Reviewed: Additional studies/ records that were reviewed today include: LHC Review of the above records demonstrates:   LHC: 10/03/13:  POST-OPERATIVE DIAGNOSIS:   Severe Ostial AV Groove Circumflex lesion with only moderate mid LAD stenosis.  Successful Scoring Balloon PTCA of the Ostial AV Groove Circumflex with a 2.5 mm AngioSculpt balloon reducing the 95% stenosis to < 30%.  PLAN OF CARE:  Return to  [facility] for standard post-radial cath care.  Continue aggressive BP control - weaning NTG gtt overnight.  DAPT for minimum of 3 months - to 1 yr. Can convert to Plavix after 3 months.  Expect D/c in AM if BP controlled.   LHC 08/05/15 Conclusion     Ost 2nd Diag to 2nd Diag lesion, 80% stenosed. 2 small for PCI  Prox LAD to Mid LAD lesion, 60% stenosed.  Prox Cx to Mid Cx lesion, 50% stenosed. The lesion was previously treated with angioplasty one to two years ago.   Essentially stable coronary artery disease as compared to last catheterization. The PTCA site does have some restenosis but only 50% and a very focal segment.  Potential culprit for angiogram would be the diagonal lesion that is not amenable to PCI. If the patient were to have worsening symptoms, would recommend noninvasive stress testing to evaluate the LAD which appears to be stable from 2015. FFR and LV gram not performed due to concern for radial/brachial spasm.  Plan: 3  Standard post radial cath care with TR band removal   Discharge after bedrest and TR band removal  Increase Imdur dose to 120 mg  Consider adding diuretic   She will f/u with me or APP post cath for medication titration         ASSESSMENT AND PLAN:  Mekhi Mcneely is a 55 y.o. female with a history of HTN, HLD, CAD: NSTEMI s/p PCTA of ost AV G LCx (09/2013) and migraines who presents to clinic for post hospital follow up after being seen in the [facility] ED for chest pain.   CAD S/P scoring balloon PCTA of Ostial AVG LCx (09/2013)/Chest pain:  -- Recent cath with stable CAD. Potential culprit lesion for angina the diagonal which was not amenable to PCI. Nitrates were increased. Chest pain has improved since being started on imdur 120mg . She is still having symptoms, but i think we should continue to monitor her a little longer before doing stress test to evaluate LAD lesion.   HTN: BP 138/84. She is on amlodipine10mg  (  I  increased this from 5--> 10mg  last visit), Coreg 25mg  BID, Losartan 100mg  daily, imdur 120mg  daily  HLD: LDL way too high at 173 on 07/16/15 ( up from 101 7 months ago). She admitted that she had not been taking her atorvastatin 80mg  due to muscle cramps. She is now back on atorva 40mg . This is probably not going to get her to her goal <70. Her PCP follows her lipids. If her LDL does not improve we may need to add zetia.   Current medicines are reviewed at length with the patient today.  The patient has concerns regarding medicines.  The following changes have been made: none Labs/ tests ordered today include:   No orders of the defined types were placed in this encounter.     Disposition:   FU with Dr. Ellyn Hack in 3-4 weeks  Signed, Crista Luria  08/13/2015 9:19 AM    [facility] [address], [place], [place]  [address] Phone: [phone]; Fax: [phone]

## 2015-08-13 ENCOUNTER — Ambulatory Visit (INDEPENDENT_AMBULATORY_CARE_PROVIDER_SITE_OTHER): Payer: BLUE CROSS/BLUE SHIELD | Admitting: Physician Assistant

## 2015-08-13 ENCOUNTER — Encounter: Payer: Self-pay | Admitting: Physician Assistant

## 2015-08-13 VITALS — BP 138/84 | HR 80 | Ht 70.0 in | Wt 284.1 lb

## 2015-08-13 DIAGNOSIS — R079 Chest pain, unspecified: Secondary | ICD-10-CM

## 2015-08-13 NOTE — Patient Instructions (Addendum)
Medication Instructions:  Your physician recommends that you continue on your current medications as directed. Please refer to the Current Medication list given to you today.   Labwork: None odered  Testing/Procedures: None ordered  Follow-Up: Your physician recommends that you schedule a follow-up appointment in: [place] DR. HARDING   Any Other Special Instructions Will Be Listed Below (If Applicable).     If you need a refill on your cardiac medications before your next appointment, please call your pharmacy.

## 2015-08-14 ENCOUNTER — Ambulatory Visit: Payer: BLUE CROSS/BLUE SHIELD | Admitting: Internal Medicine

## 2015-08-28 ENCOUNTER — Ambulatory Visit: Payer: BLUE CROSS/BLUE SHIELD | Admitting: Cardiology

## 2015-08-29 ENCOUNTER — Encounter: Payer: Self-pay | Admitting: *Deleted

## 2015-10-14 ENCOUNTER — Telehealth: Payer: Self-pay | Admitting: Internal Medicine

## 2015-10-14 NOTE — Telephone Encounter (Signed)
Team health scheduled appt for tomorrow with Deborra Medina MD for 1030am 10/15/2015.

## 2015-10-14 NOTE — Telephone Encounter (Signed)
[facility]  Patient Name: Christine Barrett  DOB: [birth year]    Initial Comment caller states she has not been able to regulate her bp for the last 3 wks - this am it is 158/112 - has been on a fast the last 3 wks - is c/o headaches   Nurse Assessment  Nurse: Wynetta Emery, RN, Baker Janus Date/Time Eilene Ghazi Time): 10/14/2015 9:50:54 AM  Confirm and document reason for call. If symptomatic, describe symptoms. You must click the next button to save text entered. ---[name] has been fasting for 3 weeks no meats sweets no breads -- on veggies fruits and nuts -- on HTN 158/112 this am headache with this  Has the patient traveled out of the country within the last 30 days? ---No  Does the patient have any new or worsening symptoms? ---Yes  Will a triage be completed? ---Yes  Related visit to physician within the last 2 weeks? ---No  Does the PT have any chronic conditions? (i.e. diabetes, asthma, etc.) ---Yes  List chronic conditions. ---HTN Heart disease  Is the patient pregnant or possibly pregnant? (Ask all females between the ages of 74-55) ---No  Is this a behavioral health or substance abuse call? ---No     Guidelines    Guideline Title Affirmed Question Affirmed Notes  High Blood Pressure BP ? 180/110    Final Disposition User   See Physician within 24 Hours Wynetta Emery, RN, Baker Janus    Comments  Dr. [name] has no appt available--scheduled appt for tomorrow with Deborra Medina MD for 1030am 10/15/2015   Referrals  REFERRED TO PCP OFFICE   Disagree/Comply: Leta Baptist

## 2015-10-15 ENCOUNTER — Encounter: Payer: Self-pay | Admitting: Family Medicine

## 2015-10-15 ENCOUNTER — Ambulatory Visit (INDEPENDENT_AMBULATORY_CARE_PROVIDER_SITE_OTHER): Payer: BLUE CROSS/BLUE SHIELD | Admitting: Family Medicine

## 2015-10-15 VITALS — BP 148/82 | HR 64 | Temp 97.8°F | Wt 273.0 lb

## 2015-10-15 DIAGNOSIS — Z9861 Coronary angioplasty status: Secondary | ICD-10-CM | POA: Diagnosis not present

## 2015-10-15 DIAGNOSIS — I2510 Atherosclerotic heart disease of native coronary artery without angina pectoris: Secondary | ICD-10-CM | POA: Diagnosis not present

## 2015-10-15 DIAGNOSIS — I10 Essential (primary) hypertension: Secondary | ICD-10-CM | POA: Diagnosis not present

## 2015-10-15 NOTE — Progress Notes (Signed)
Subjective:   Patient ID: Christine Barrett, female    DOB: [birth year], 55 y.o.   MRN: [id number]  Christine Barrett is a pleasant 55 y.o. year old female pt, new to me, who presents to clinic today with Hypertension  on 10/15/2015  HPI:  H/o HTN, HLD, CAD, NSTEMI s/p PCTA in 2015 followed by Cardiology, Dr. Ellyn Hack, here for elevated blood pressure.  Last seen by cardiology, Angelena Form, on 08/13/15. Note reviewed.  BP at that time was well controlled on norvasc 10 mg daily, Coreg 25 mg twice daily, Losartan 100 mg daily and Imdur 120 mg daily.  Past few weeks, doing a fruit and juice fast with her church.  Felt dizzy, checked BP on wrist cuff and it was running in [id number] systolic.  Of note, has not taken her BP rxs this morning yet.  Current Outpatient Prescriptions on File Prior to Visit  Medication Sig Dispense Refill  . acetaminophen (TYLENOL) 500 MG tablet Take 1,000 mg by mouth every 6 (six) hours as needed for moderate pain or headache.    [name] amLODipine (NORVASC) 10 MG tablet Take 1 tablet (10 mg total) by mouth daily. 90 tablet 3  . aspirin EC 81 MG EC tablet Take 1 tablet (81 mg total) by mouth daily.    [name] atorvastatin (LIPITOR) 40 MG tablet Take 40 mg by mouth daily.    . carvedilol (COREG) 25 MG tablet Take 1 tablet (25 mg total) by mouth 2 (two) times daily. 180 tablet 0  . cetirizine (ZYRTEC) 10 MG tablet Take 10 mg by mouth daily.    . fluticasone (FLONASE) 50 MCG/ACT nasal spray Place 1 spray into both nostrils 2 (two) times daily as needed for allergies or rhinitis.    [name] isosorbide mononitrate (IMDUR) 120 MG 24 hr tablet Take 1 tablet (120 mg total) by mouth daily. 90 tablet 4  . losartan (COZAAR) 100 MG tablet Take 1 tablet (100 mg total) by mouth daily. 90 tablet 0  . omeprazole (PRILOSEC) 20 MG capsule Take 20 mg by mouth as needed (REFLUX).     No current facility-administered medications on file prior to visit.    Allergies  Allergen Reactions  . Penicillins  Rash    Pt states she has taken since intial  reaction without recurrence.     Past Medical History  Diagnosis Date  . Essential hypertension     with prior Accelerated HTN  . Hyperlipemia   . Hx of non-ST elevation myocardial infarction (NSTEMI) 10/01/2013    Due to Accelerated HTN with existing CAD  . CAD S/P percutaneous coronary angioplasty 09/2013    a) Ostial AV G Cx - 2.5 mm Angiosculpt; mid LAD 40-60%; b) Myoview 07/2014: LOW RISK, small-severe fixed inferior defect c/w infarct w/o peri-infarct ischemia.  . Chronic bronchitis ([place])     "frequently; not q yr" (10/03/2013)  . Migraine     "@ least once/month" (10/03/2013)  . Seasonal allergies   . Sinusitis     Past Surgical History  Procedure Laterality Date  . Cesarean section  1989  . Nasal septoplasty w/ turbinoplasty  ~ 2007  . Coronary angioplasty  10/03/2013    95% ostial AV G Cx - 2.5 mm AngioSculpt Balloon PTCA; mid LAD 40-60%  . Tubal ligation  1989  . Abdominal hysterectomy  1994    "partial"  . Left heart catheterization with coronary angiogram N/A 10/02/2013    Procedure: LEFT HEART CATHETERIZATION WITH CORONARY ANGIOGRAM;  Surgeon: Marcello Moores  Floyce Stakes, MD;  Location: [facility] CATH LAB;  Service: Cardiovascular;  Laterality: N/A;  . Percutaneous coronary stent intervention (pci-s) N/A 10/03/2013    cutting balloon angioplasty only no stent.   . Transthoracic echocardiogram  10/02/2013    EF 55-60%; mild LVH, no RWMA,   . Nm myoview ltd  08/22/2014     Low risk stress nuclear study with a small, severe, fixed defect in the distal inferior wall/apex suggestive of small prior infarct; no ischemia.  LV Wall Motion: NL LV Function; NL Wall Motion  . Cardiac catheterization N/A 08/05/2015    Procedure: Left Heart Cath and Coronary Angiography;  Surgeon: [name], MD;  Location: [place] CV LAB;  Service: Cardiovascular;  Laterality: N/A;    Family History  Problem Relation Age of Onset  . Adopted: Yes  . Diabetes Mother   .  Breast cancer Sister   . Colon cancer Neg Hx   . Heart attack Neg Hx   . Stroke Neg Hx   . Hypertension Sister     Social History   Social History  . Marital Status: Married    Spouse Name: Herbie Baltimore  . Number of Children: 2  . Years of Education: N/A   Occupational History  . Research scientist (life sciences)    Social History Main Topics  . Smoking status: Never Smoker   . Smokeless tobacco: Never Used  . Alcohol Use: No  . Drug Use: No  . Sexual Activity: Yes    Birth Control/ Protection: Surgical   Other Topics Concern  . Not on file   Social History Narrative   She is a married mother of 2, grandmother 2. Her current marriage is than a pack years.    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 She never smoked and does not drink All. She does do time and balancing exercises but does not do routine of exercise.   She herself is adopted.   The PMH, PSH, Social History, Family History, Medications, and allergies have been reviewed in [facility], and have been updated if relevant.  Review of Systems  Constitutional: Negative.   Eyes: Negative.   Respiratory: Negative.   Cardiovascular: Negative.   Neurological: Positive for dizziness. Negative for tremors, seizures, syncope, facial asymmetry, speech difficulty, weakness, light-headedness, numbness and headaches.  All other systems reviewed and are negative.      Objective:    BP 148/82 mmHg  Pulse 64  Temp(Src) 97.8 F (36.6 C) (Oral)  Wt 273 lb (123.832 kg)  SpO2 98% Wt Readings from Last 3 Encounters:  10/15/15 273 lb (123.832 kg)  08/13/15 284 lb 1.9 oz (128.876 kg)  08/05/15 285 lb (129.275 kg)     Physical Exam  Constitutional: She is oriented to person, place, and time. She appears well-developed and well-nourished. No distress.  HENT:  Head: Normocephalic.  Eyes: Conjunctivae are normal.  Cardiovascular: Normal rate and regular rhythm.     Pulmonary/Chest: Effort normal and breath sounds normal.  Musculoskeletal: Normal range of motion. She exhibits no edema.  Neurological: She is alert and oriented to person, place, and time. No cranial nerve deficit.  Skin: Skin is warm and dry. She is not diaphoretic.  Psychiatric: She has a normal mood and affect. Her behavior is normal. Judgment and thought content normal.  Nursing note and vitals reviewed.         Assessment & Plan:  Essential hypertension No Follow-up on file.

## 2015-10-15 NOTE — Patient Instructions (Signed)
Good to see you OMRON in a good blood pressure cuff.

## 2015-10-15 NOTE — Progress Notes (Signed)
Pre visit review using our clinic review tool, if applicable. No additional management support is needed unless otherwise documented below in the visit note. 

## 2015-10-15 NOTE — Assessment & Plan Note (Signed)
Close to normotensive and has not taken her meds today. Advised to go home, eat and take rxs. Given names of BP cuffs that tend to be more accurate. She can return here or cardiology to have BP rechecked as well. No changes made to rxs today. The patient indicates understanding of these issues and agrees with the plan.

## 2015-11-20 ENCOUNTER — Telehealth: Payer: Self-pay | Admitting: Family Medicine

## 2015-11-20 NOTE — Telephone Encounter (Signed)
Patient said she switched to another doctor because she couldn't get in with Dr.[name].  Patient was seeing Dr.Keck at the [facility]. Dr.Keck is relocating to [place].  Patient wants to know if she can come back to see Dr.[name]. Patient is having a sharp pain in her chest.  Patient has been to the hospital and urgent cares.  Patient said she gets brushed off. Patient would like to come back to [facility] and get her advice.Please advise.

## 2015-11-21 NOTE — Telephone Encounter (Signed)
Okay to schedule appointment

## 2015-11-21 NOTE — Telephone Encounter (Signed)
I left a message on patient's voice mail to call back and schedule appointment. 

## 2015-12-02 NOTE — Telephone Encounter (Signed)
Left message for pt to call office, based on a previous request for an appointment.  Need to verify insurance.

## 2015-12-06 ENCOUNTER — Encounter: Payer: Self-pay | Admitting: Family Medicine

## 2015-12-06 ENCOUNTER — Ambulatory Visit (INDEPENDENT_AMBULATORY_CARE_PROVIDER_SITE_OTHER): Payer: BLUE CROSS/BLUE SHIELD | Admitting: Family Medicine

## 2015-12-06 DIAGNOSIS — R252 Cramp and spasm: Secondary | ICD-10-CM

## 2015-12-06 DIAGNOSIS — R0789 Other chest pain: Secondary | ICD-10-CM | POA: Diagnosis not present

## 2015-12-06 MED ORDER — PANTOPRAZOLE SODIUM 40 MG PO TBEC: 40.0000 mg | DELAYED_RELEASE_TABLET | Freq: Every day | ORAL | Status: DC

## 2015-12-06 NOTE — Patient Instructions (Addendum)
Start atorvastatin three times a week, if tolerating increase as much as you can until leg cramps. If not tolerating try 2 times a week or 20 mg three times a week. Start pantoprazole 40 mg daily. Call if chest pain is not improved in 4 weeks. Avoid acid triggers like tomatos. Try trial of increase in carvedilol as recommended by cardiology.

## 2015-12-06 NOTE — Progress Notes (Signed)
Subjective:    Patient ID: Christine Barrett, female    DOB: 08-16-1960, 55 y.o.   MRN: [id number]  HPI   55 year old female with history for HTN, CAD NSTEMI s/p PTCA in 2015, GERD,  And high cholesterol presents with continued  chest pain.   She is followed by Dr. Ellyn Hack Cardiology. She was seen in 07/2014 for SOB and Myoview was ordered which returned LOW RISK w/ small-severe fixed inferior defect c/w infarct w/ peri-infarct ischemia.  07/2015 cath: stable coronary arteries  At Last cardiology OV 09/2015: Dr. Ellyn Hack stated  "Potential culprit lesion for angina the diagonal which was not amenable to PCI. Nitrates were increased. Chest pain has improved since being started on imdur 120mg . She is still having symptoms, but I think we should continue to monitor her a little longer before doing stress test to evaluate LAD lesion"  She felt uneasy about their recs.. So made appt with Cardiologist at [facility].  Dr. Maylon Peppers Per pt he felt stents were not an option, but he felt there may be one artery that had progressed in blockage that could be contributing to pain.  Told to increase carvedilol.. She has not. She continues to have more frequent episodes. Pain and pressure, some burning in chest  Lasting several days at a time. Fluctuates in intensity. Occuring 1-2 times a month. No different in eating, if food hot it feels like it is uncomforale but does not trigger the pain.  Substernal.  Radiation of pain to left arm. Occ at rest, cannot lie down when she has then pain because it hurts worse. Has woke up at night with the pain.  No st , no hoarse voice.  She has tried omeprazole x 1 month .[name] Did not help.  Diet: 75 % healthy, increase water, has lower salt intake.  She cannot tolerate lipitor 40 with severe leg cramping  BP Readings from Last 3 Encounters:  12/06/15 138/80  10/15/15 148/82  08/13/15 138/84     Social History /Family History/Past Medical History reviewed and updated  if needed.   Review of Systems  All other systems reviewed and are negative.      Objective:   Physical Exam  Constitutional: Vital signs are normal. She appears well-developed and well-nourished. She is cooperative.  Non-toxic appearance. She does not appear ill. No distress.  HENT:  Head: Normocephalic.  Right Ear: Hearing, tympanic membrane, external ear and ear canal normal. Tympanic membrane is not erythematous, not retracted and not bulging.  Left Ear: Hearing, tympanic membrane, external ear and ear canal normal. Tympanic membrane is not erythematous, not retracted and not bulging.  Nose: No mucosal edema or rhinorrhea. Right sinus exhibits no maxillary sinus tenderness and no frontal sinus tenderness. Left sinus exhibits no maxillary sinus tenderness and no frontal sinus tenderness.  Mouth/Throat: Uvula is midline, oropharynx is clear and moist and mucous membranes are normal.  Eyes: Conjunctivae, EOM and lids are normal. Pupils are equal, round, and reactive to light. Lids are everted and swept, no foreign bodies found.  Neck: Trachea normal and normal range of motion. Neck supple. Carotid bruit is not present. No thyroid mass and no thyromegaly present.  Cardiovascular: Normal rate, regular rhythm, S1 normal, S2 normal, normal heart sounds, intact distal pulses and normal pulses.  Exam reveals no gallop and no friction rub.   No murmur heard. Pulmonary/Chest: Effort normal and breath sounds normal. No tachypnea. No respiratory distress. She has no decreased breath sounds. She has  no wheezes. She has no rhonchi. She has no rales.  Abdominal: Soft. Normal appearance and bowel sounds are normal. There is no hepatosplenomegaly. There is tenderness in the epigastric area. There is no rigidity, no rebound, no guarding and no CVA tenderness.  Neurological: She is alert.  Skin: Skin is warm, dry and intact. No rash noted.  Psychiatric: Her speech is normal and behavior is normal. Judgment  and thought content normal. Her mood appears not anxious. Cognition and memory are normal. She does not exhibit a depressed mood.          Assessment & Plan:

## 2015-12-06 NOTE — Progress Notes (Signed)
Pre visit review using our clinic review tool, if applicable. No additional management support is needed unless otherwise documented below in the visit note. 

## 2015-12-15 DIAGNOSIS — J0140 Acute pansinusitis, unspecified: Secondary | ICD-10-CM | POA: Diagnosis not present

## 2015-12-15 DIAGNOSIS — J209 Acute bronchitis, unspecified: Secondary | ICD-10-CM | POA: Diagnosis not present

## 2015-12-21 ENCOUNTER — Encounter: Payer: Self-pay | Admitting: Family Medicine

## 2015-12-21 DIAGNOSIS — I10 Essential (primary) hypertension: Secondary | ICD-10-CM

## 2015-12-24 MED ORDER — CARVEDILOL 25 MG PO TABS
25.0000 mg | ORAL_TABLET | Freq: Three times a day (TID) | ORAL | Status: DC
Start: 2015-12-24 — End: 2016-12-01

## 2015-12-24 NOTE — Addendum Note (Signed)
Addended by: Eliezer Lofts E on: 12/24/2015 07:43 AM   Modules accepted: Orders

## 2016-01-20 ENCOUNTER — Encounter: Payer: Self-pay | Admitting: Family Medicine

## 2016-01-20 ENCOUNTER — Other Ambulatory Visit: Payer: Self-pay | Admitting: Internal Medicine

## 2016-01-20 DIAGNOSIS — I10 Essential (primary) hypertension: Secondary | ICD-10-CM

## 2016-01-20 MED ORDER — AMLODIPINE BESYLATE 10 MG PO TABS: 10.0000 mg | ORAL_TABLET | Freq: Every day | ORAL | 1 refills | Status: DC

## 2016-01-24 ENCOUNTER — Other Ambulatory Visit: Payer: Self-pay | Admitting: Internal Medicine

## 2016-01-24 DIAGNOSIS — I10 Essential (primary) hypertension: Secondary | ICD-10-CM

## 2016-01-29 ENCOUNTER — Encounter: Payer: Self-pay | Admitting: Family Medicine

## 2016-01-29 ENCOUNTER — Ambulatory Visit (INDEPENDENT_AMBULATORY_CARE_PROVIDER_SITE_OTHER): Payer: BLUE CROSS/BLUE SHIELD | Admitting: Family Medicine

## 2016-01-29 DIAGNOSIS — R609 Edema, unspecified: Secondary | ICD-10-CM | POA: Diagnosis not present

## 2016-01-29 DIAGNOSIS — I10 Essential (primary) hypertension: Secondary | ICD-10-CM

## 2016-01-29 DIAGNOSIS — B353 Tinea pedis: Secondary | ICD-10-CM

## 2016-01-29 DIAGNOSIS — R600 Localized edema: Secondary | ICD-10-CM | POA: Insufficient documentation

## 2016-01-29 MED ORDER — AMLODIPINE BESYLATE 5 MG PO TABS: 5.0000 mg | ORAL_TABLET | Freq: Every day | ORAL | 11 refills | Status: DC

## 2016-01-29 NOTE — Progress Notes (Signed)
   Subjective:    Patient ID: Christine Barrett, female    DOB: [birth year], 55 y.o.   MRN: [id number]  HPI  55 year old female with history of HTN, CAD s/p percutaneous angioplasty presents with peripheral edema.   She has swelling for a while but it has been worse lately. Decreases some  In AM after sleep but gets more and more swelling.  Now feet are getting swollen too.  Stable fatigue, no new SOB.  Wt Readings from Last 3 Encounters:  01/29/16 286 lb 8 oz (130 kg)  12/06/15 280 lb 8 oz (127.2 kg)  10/15/15 273 lb (123.8 kg)     BP is well controlled on amlodipine 10 mg ( increased from 5 to 10 at 07/2015 OV at cardiology) , coreg (now on coreg three times daiuly, this has significantly loer BP), cozaar    Recent cadiac cath stable CAD in 07/2015  Last ECHO 2015 EF 55-60% BP Readings from Last 3 Encounters:  01/29/16 110/64  12/06/15 138/80  10/15/15 (!) 148/82     Protnix has helped with chest pain and reflux.  Chronic dry flaky skin on feet.  Review of Systems  Constitutional: Negative for fatigue and fever.  HENT: Negative for ear pain.   Eyes: Negative for pain.  Respiratory: Negative for chest tightness and shortness of breath.   Cardiovascular: Positive for leg swelling. Negative for chest pain and palpitations.  Gastrointestinal: Negative for abdominal pain.  Genitourinary: Negative for dysuria.       Objective:   Physical Exam  Constitutional: Vital signs are normal. She appears well-developed and well-nourished. She is cooperative.  Non-toxic appearance. She does not appear ill. No distress.  obese  HENT:  Head: Normocephalic.  Right Ear: Hearing, tympanic membrane, external ear and ear canal normal. Tympanic membrane is not erythematous, not retracted and not bulging.  Left Ear: Hearing, tympanic membrane, external ear and ear canal normal. Tympanic membrane is not erythematous, not retracted and not bulging.  Nose: No mucosal edema or rhinorrhea. Right sinus  exhibits no maxillary sinus tenderness and no frontal sinus tenderness. Left sinus exhibits no maxillary sinus tenderness and no frontal sinus tenderness.  Mouth/Throat: Uvula is midline, oropharynx is clear and moist and mucous membranes are normal.  Eyes: Conjunctivae, EOM and lids are normal. Pupils are equal, round, and reactive to light. Lids are everted and swept, no foreign bodies found.  Neck: Trachea normal and normal range of motion. Neck supple. Carotid bruit is not present. No thyroid mass and no thyromegaly present.  Cardiovascular: Normal rate, regular rhythm, S1 normal, S2 normal, normal heart sounds, intact distal pulses and normal pulses.  Exam reveals no gallop and no friction rub.   No murmur heard. Bilateral 2 plus pitting edema  Pulmonary/Chest: Effort normal and breath sounds normal. No tachypnea. No respiratory distress. She has no decreased breath sounds. She has no wheezes. She has no rhonchi. She has no rales.  Abdominal: Soft. Normal appearance and bowel sounds are normal. There is no tenderness.  Neurological: She is alert.  Skin: Skin is warm, dry and intact. No rash noted.  Psychiatric: Her speech is normal and behavior is normal. Judgment and thought content normal. Her mood appears not anxious. Cognition and memory are normal. She does not exhibit a depressed mood.          Assessment & Plan:

## 2016-01-29 NOTE — Patient Instructions (Addendum)
Decrease amlodipine back to 5 mg daily.  Follow BP at home.. Goal BP < 140/90.  Call if swelling is not improving in 2 weeks.. For possible additional lab evaluation or stopping amlodipine all together.  Use antifungal over the counter lotion on feet for possible fungal infection on feet.

## 2016-01-29 NOTE — Assessment & Plan Note (Signed)
Bp very well controlled. Will try to decrease amlodipine.. Follow BP closely at home to make sure not increasing.

## 2016-01-29 NOTE — Assessment & Plan Note (Signed)
?   If cause of dry ski and flakiness on bilateral soles of feet. Trial of OTC antifungal.

## 2016-01-29 NOTE — Assessment & Plan Note (Signed)
Likely due ot increase in amlodipine. If not improving with decrease to 5 mg, may need to eval with labs to rule out other causes and /or stop amlodipine.

## 2016-02-04 ENCOUNTER — Encounter: Payer: Self-pay | Admitting: Family Medicine

## 2016-02-04 NOTE — Telephone Encounter (Signed)
Let pt know that prescription for compression hose is ready. Placed in outbox.

## 2016-02-07 DIAGNOSIS — R252 Cramp and spasm: Secondary | ICD-10-CM | POA: Insufficient documentation

## 2016-02-07 NOTE — Assessment & Plan Note (Signed)
Start pantoprazole 40 mg daily. Call if chest pain is not improved in 4 weeks. Avoid acid triggers like tomatos.

## 2016-02-07 NOTE — Assessment & Plan Note (Signed)
?   SE to statin.  Start atorvastatin three times a week, if tolerating increase as much as you can until leg cramps. If not tolerating try 2 times a week or 20 mg three times a week.

## 2016-02-11 ENCOUNTER — Encounter: Payer: Self-pay | Admitting: Family Medicine

## 2016-02-11 ENCOUNTER — Ambulatory Visit (INDEPENDENT_AMBULATORY_CARE_PROVIDER_SITE_OTHER): Payer: BLUE CROSS/BLUE SHIELD | Admitting: Family Medicine

## 2016-02-11 DIAGNOSIS — K219 Gastro-esophageal reflux disease without esophagitis: Secondary | ICD-10-CM | POA: Diagnosis not present

## 2016-02-11 DIAGNOSIS — I10 Essential (primary) hypertension: Secondary | ICD-10-CM | POA: Diagnosis not present

## 2016-02-11 DIAGNOSIS — R609 Edema, unspecified: Secondary | ICD-10-CM

## 2016-02-11 MED ORDER — CHLORTHALIDONE 50 MG PO TABS: 50.0000 mg | ORAL_TABLET | Freq: Every day | ORAL | 11 refills | Status: DC

## 2016-02-11 MED ORDER — PANTOPRAZOLE SODIUM 40 MG PO TBEC: 40.0000 mg | DELAYED_RELEASE_TABLET | Freq: Every day | ORAL | 3 refills | Status: DC

## 2016-02-11 NOTE — Assessment & Plan Note (Signed)
Slight improvement on lower dose amlodipine. Will stop and start chlorthalidone.  She  Will work on exercise and weight loss and wear compression hose as directed ( prescription given).  Follow up in 2 weeks for BP and edema check.

## 2016-02-11 NOTE — Assessment & Plan Note (Signed)
Follow at home with medication changes. Recheck in 2 weeks.

## 2016-02-11 NOTE — Patient Instructions (Addendum)
Stop amlodipine and start chlorthalidone.  Follow BP at home.. Call if greater than 140/90.  Can try Kerasal ointment OTC for calluses and dry skin on feet. Put on at night then cover with socks.

## 2016-02-11 NOTE — Progress Notes (Signed)
   Subjective:    Patient ID: Christine Barrett, female    DOB: [birth year], 55 y.o.   MRN: [id number]  HPI   55 year old female with history of  HTN, CAD hyperlipidemia presents with peripheral edema and swelling in ankles. Legs are painful.  She is on amlodipine 5 mg daily as well as coreg 25, isosorbide, losartan 100 mg.   At last OV on 01/29/2016 she was decreased from 10 mg amlodipine to 5 mg.  She reports  slight improvement in swelling. Pain in her legs bilaterally is greater. No SOB.  Her chest pain has returned  As she has lost her protonix.  No overall weight change in last 2 weeks. Wt Readings from Last 3 Encounters:  02/11/16 286 lb (129.7 kg)  01/29/16 286 lb 8 oz (130 kg)  12/06/15 280 lb 8 oz (127.2 kg)   BP Readings from Last 3 Encounters:  02/11/16 108/70  01/29/16 110/64  12/06/15 138/80    2015 2D ECHO: EF 55-60%; mild LVH   Review of Systems  Constitutional: Negative for fatigue and fever.  HENT: Negative for ear pain.   Eyes: Negative for pain.  Respiratory: Negative for chest tightness and shortness of breath.   Cardiovascular: Negative for chest pain, palpitations and leg swelling.  Gastrointestinal: Negative for abdominal pain.  Genitourinary: Negative for dysuria.       Objective:   Physical Exam  Constitutional: Vital signs are normal. She appears well-developed and well-nourished. She is cooperative.  Non-toxic appearance. She does not appear ill. No distress.  HENT:  Head: Normocephalic.  Right Ear: Hearing, tympanic membrane, external ear and ear canal normal. Tympanic membrane is not erythematous, not retracted and not bulging.  Left Ear: Hearing, tympanic membrane, external ear and ear canal normal. Tympanic membrane is not erythematous, not retracted and not bulging.  Nose: No mucosal edema or rhinorrhea. Right sinus exhibits no maxillary sinus tenderness and no frontal sinus tenderness. Left sinus exhibits no maxillary sinus tenderness and no  frontal sinus tenderness.  Mouth/Throat: Uvula is midline, oropharynx is clear and moist and mucous membranes are normal.  Eyes: Conjunctivae, EOM and lids are normal. Pupils are equal, round, and reactive to light. Lids are everted and swept, no foreign bodies found.  Neck: Trachea normal and normal range of motion. Neck supple. Carotid bruit is not present. No thyroid mass and no thyromegaly present.  Cardiovascular: Normal rate, regular rhythm, S1 normal, S2 normal, normal heart sounds, intact distal pulses and normal pulses.  Exam reveals no gallop and no friction rub.   No murmur heard.  1 plus bilateral edema to midcalf.   Pulmonary/Chest: Effort normal and breath sounds normal. No tachypnea. No respiratory distress. She has no decreased breath sounds. She has no wheezes. She has no rhonchi. She has no rales.  Abdominal: Soft. Normal appearance and bowel sounds are normal. There is no tenderness.  Neurological: She is alert.  Skin: Skin is warm, dry and intact. No rash noted.  Psychiatric: Her speech is normal and behavior is normal. Judgment and thought content normal. Her mood appears not anxious. Cognition and memory are normal. She does not exhibit a depressed mood.          Assessment & Plan:

## 2016-02-11 NOTE — Assessment & Plan Note (Signed)
Improved control when on protonix.[name] Refill given lost prescription. Pt will pay out of pocket.

## 2016-02-24 ENCOUNTER — Ambulatory Visit (INDEPENDENT_AMBULATORY_CARE_PROVIDER_SITE_OTHER): Payer: BLUE CROSS/BLUE SHIELD | Admitting: Family Medicine

## 2016-02-24 ENCOUNTER — Encounter: Payer: Self-pay | Admitting: Family Medicine

## 2016-02-24 VITALS — BP 110/70 | HR 84 | Temp 98.5°F | Ht 69.0 in | Wt 282.0 lb

## 2016-02-24 DIAGNOSIS — R609 Edema, unspecified: Secondary | ICD-10-CM | POA: Diagnosis not present

## 2016-02-24 DIAGNOSIS — K219 Gastro-esophageal reflux disease without esophagitis: Secondary | ICD-10-CM

## 2016-02-24 DIAGNOSIS — I10 Essential (primary) hypertension: Secondary | ICD-10-CM

## 2016-02-24 MED ORDER — LOSARTAN POTASSIUM 100 MG PO TABS: 100.0000 mg | ORAL_TABLET | Freq: Every day | ORAL | 1 refills | Status: DC

## 2016-02-24 NOTE — Progress Notes (Signed)
Pre visit review using our clinic review tool, if applicable. No additional management support is needed unless otherwise documented below in the visit note. 

## 2016-02-24 NOTE — Assessment & Plan Note (Signed)
Initial improvement with protonix, but on restarting this time in last 1.5 weeks minimal improvement. Avoiding triggers.  No abdominal pain. Has had full neg cardiac work up for chest pain in last several months.  Refer to GI for further eval.

## 2016-02-24 NOTE — Assessment & Plan Note (Signed)
Bp excellent control on current regimen.

## 2016-02-24 NOTE — Progress Notes (Signed)
   Subjective:    Patient ID: Christine Barrett, female    DOB: 12-Jun-1961, 55 y.o.   MRN: [id number]  HPI  55 year old female with HTN, CAD presents for 2 week follow up BP.  At last OV given SE of swelling with amlodipine, we changed her to chlorthalidone 50 mg daily. She also continues isosorbide and  Losartan.  She has been using compression hose.   Her BP has remained under control and her swelling is improved.  She now able to wear cute shoes.  She has no SE to any meds.  BP Readings from Last 3 Encounters:  02/24/16 110/70  02/11/16 108/70  01/29/16 110/64     She has lost 4 lbs since last OV. Wt Readings from Last 3 Encounters:  02/24/16 282 lb (127.9 kg)  02/11/16 286 lb (129.7 kg)  01/29/16 286 lb 8 oz (130 kg)    She has had recurrence of chest pain centrally. Occurring off and on in last week. Starting back on protonix (02/04/2016) on  has helped but not caused it to resolve.  Occ  Feeling of food getting stuck in chest.  She has never had endoscopy.  Hass had full cards work up.  Review of Systems  Constitutional: Negative for fatigue and fever.  HENT: Negative for ear pain.   Eyes: Negative for pain.  Respiratory: Negative for chest tightness and shortness of breath.   Cardiovascular: Negative for chest pain, palpitations and leg swelling.  Gastrointestinal: Negative for abdominal pain.  Genitourinary: Negative for dysuria.       Objective:   Physical Exam  Constitutional: Vital signs are normal. She appears well-developed and well-nourished. She is cooperative.  Non-toxic appearance. She does not appear ill. No distress.  HENT:  Head: Normocephalic.  Right Ear: Hearing, tympanic membrane, external ear and ear canal normal. Tympanic membrane is not erythematous, not retracted and not bulging.  Left Ear: Hearing, tympanic membrane, external ear and ear canal normal. Tympanic membrane is not erythematous, not retracted and not bulging.  Nose: No mucosal edema  or rhinorrhea. Right sinus exhibits no maxillary sinus tenderness and no frontal sinus tenderness. Left sinus exhibits no maxillary sinus tenderness and no frontal sinus tenderness.  Mouth/Throat: Uvula is midline, oropharynx is clear and moist and mucous membranes are normal.  Eyes: Conjunctivae, EOM and lids are normal. Pupils are equal, round, and reactive to light. Lids are everted and swept, no foreign bodies found.  Neck: Trachea normal and normal range of motion. Neck supple. Carotid bruit is not present. No thyroid mass and no thyromegaly present.  Cardiovascular: Normal rate, regular rhythm, S1 normal, S2 normal, normal heart sounds, intact distal pulses and normal pulses.  Exam reveals no gallop and no friction rub.   No murmur heard. Pulmonary/Chest: Effort normal and breath sounds normal. No tachypnea. No respiratory distress. She has no decreased breath sounds. She has no wheezes. She has no rhonchi. She has no rales.  Abdominal: Soft. Normal appearance and bowel sounds are normal. There is no tenderness.  Neurological: She is alert.  Skin: Skin is warm, dry and intact. No rash noted.  Psychiatric: Her speech is normal and behavior is normal. Judgment and thought content normal. Her mood appears not anxious. Cognition and memory are normal. She does not exhibit a depressed mood.          Assessment & Plan:

## 2016-02-24 NOTE — Assessment & Plan Note (Signed)
Resolved with chlorthalidone.

## 2016-02-24 NOTE — Patient Instructions (Signed)
Continue current medications.  Stop at front desk to set up GI referral for GERD.

## 2016-02-24 NOTE — Addendum Note (Signed)
Addended by: Carter Kitten on: 02/24/2016 10:32 AM   Modules accepted: Orders

## 2016-02-25 ENCOUNTER — Encounter: Payer: Self-pay | Admitting: Internal Medicine

## 2016-02-25 ENCOUNTER — Encounter: Payer: Self-pay | Admitting: Gastroenterology

## 2016-02-25 ENCOUNTER — Ambulatory Visit (INDEPENDENT_AMBULATORY_CARE_PROVIDER_SITE_OTHER): Payer: BLUE CROSS/BLUE SHIELD | Admitting: Gastroenterology

## 2016-02-25 VITALS — BP 112/70 | HR 74 | Ht 69.5 in | Wt 280.0 lb

## 2016-02-25 DIAGNOSIS — R1013 Epigastric pain: Secondary | ICD-10-CM | POA: Diagnosis not present

## 2016-02-25 DIAGNOSIS — R1310 Dysphagia, unspecified: Secondary | ICD-10-CM | POA: Diagnosis not present

## 2016-02-25 DIAGNOSIS — R0789 Other chest pain: Secondary | ICD-10-CM

## 2016-02-25 NOTE — Progress Notes (Addendum)
     02/25/2016 Christine Barrett [id number] 07-17-1960   History of Present Illness:  This is a pleasant 55 year old female who is known to Dr. Hilarie Fredrickson for a colonoscopy last year.  She presents to our office today with complaints of chest pain, epigastric abdominal pain, and issues with swallowing.  She tells me that all of this began several months ago. The pain is located in the center of her chest and sometimes causes radiation down her left arm. Due to the symptoms she underwent cardiac evaluation including catheterization in February of this year, which they deemed relatively unremarkable. She continues to have symptoms. She's been on protonix 40 mg daily for the past couple of months and that has provided some mild relief of her symptoms. She describes the pain as being very sharp. She also reports difficulty swallowing solid foods at times saying that they seem to get hung up in her esophagus and then causes pain and pressure.  No issues swallowing liquids alone.   Current Medications, Allergies, Past Medical History, Past Surgical History, Family History and Social History were reviewed in [employer] record.   Physical Exam: BP 112/70   Pulse 74   Ht 5' 9.5" (1.765 m)   Wt 280 lb (127 kg)   BMI 40.76 kg/m  General: Well developed black female in no acute distress Head: Normocephalic and atraumatic Eyes:  Sclerae anicteric, conjunctiva pink  Ears: Normal auditory acuity Lungs: Clear throughout to auscultation Heart: Regular rate and rhythm Abdomen: Soft, non-distended.  Normal bowel sounds.  Mild epigatric TTP. Musculoskeletal: Symmetrical with no gross deformities  Extremities: No edema  Neurological: Alert oriented x 4, grossly non-focal Psychological:  Alert and cooperative. Normal mood and affect  Assessment and Recommendations: -Dysphagia to solid food, epigastric pain, and chest pain (thought to be non-cardiac):  Will schedule for EGD with Dr. Hilarie Fredrickson  with possible dilation. She will increase her Protonix to twice daily in the interim.  Rule out stricture vs esophagitis, etc.  Addendum: Reviewed and agree with initial management. Jerene Bears, MD

## 2016-02-25 NOTE — Patient Instructions (Signed)
Increase protonix to twice a day.   You have been scheduled for an endoscopy. Please follow written instructions given to you at your visit today. If you use inhalers (even only as needed), please bring them with you on the day of your procedure.

## 2016-03-03 ENCOUNTER — Encounter: Payer: Self-pay | Admitting: Family Medicine

## 2016-03-03 DIAGNOSIS — I10 Essential (primary) hypertension: Secondary | ICD-10-CM

## 2016-03-03 MED ORDER — ATORVASTATIN CALCIUM 40 MG PO TABS: 40.0000 mg | ORAL_TABLET | Freq: Every day | ORAL | 0 refills | Status: DC

## 2016-03-05 ENCOUNTER — Other Ambulatory Visit: Payer: BLUE CROSS/BLUE SHIELD

## 2016-03-05 ENCOUNTER — Ambulatory Visit (AMBULATORY_SURGERY_CENTER): Payer: BLUE CROSS/BLUE SHIELD | Admitting: Internal Medicine

## 2016-03-05 ENCOUNTER — Encounter: Payer: Self-pay | Admitting: Internal Medicine

## 2016-03-05 VITALS — BP 147/85 | HR 76 | Temp 96.9°F | Resp 15 | Ht 69.5 in | Wt 280.0 lb

## 2016-03-05 DIAGNOSIS — K210 Gastro-esophageal reflux disease with esophagitis: Secondary | ICD-10-CM | POA: Diagnosis not present

## 2016-03-05 DIAGNOSIS — R1310 Dysphagia, unspecified: Secondary | ICD-10-CM

## 2016-03-05 MED ORDER — PANTOPRAZOLE SODIUM 40 MG PO TBEC: 40.0000 mg | DELAYED_RELEASE_TABLET | Freq: Two times a day (BID) | ORAL | 2 refills | Status: DC

## 2016-03-05 MED ORDER — SODIUM CHLORIDE 0.9 % IV SOLN: 500.0000 mL | INTRAVENOUS | Status: DC

## 2016-03-05 NOTE — Progress Notes (Signed)
Called to room to assist during endoscopic procedure.  Patient ID and intended procedure confirmed with present staff. Received instructions for my participation in the procedure from the performing physician.  

## 2016-03-05 NOTE — Op Note (Signed)
Christine Barrett Patient Name: Christine Barrett Procedure Date: 03/05/2016 7:57 AM MRN: [id number] Endoscopist: Jerene Bears , MD Age: 55 Referring MD:  Date of Birth: 08-14-1960 Gender: Female Account #: [id number] Procedure:                Upper GI endoscopy Indications:              Dysphagia, Chest pain (non cardiac) Medicines:                Monitored Anesthesia Care Procedure:                Pre-Anesthesia Assessment:                           - Prior to the procedure, a History and Physical                            was performed, and patient medications and                            allergies were reviewed. The patient's tolerance of                            previous anesthesia was also reviewed. The risks                            and benefits of the procedure and the sedation                            options and risks were discussed with the patient.                            All questions were answered, and informed consent                            was obtained. Prior Anticoagulants: The patient has                            taken no previous anticoagulant or antiplatelet                            agents. ASA Grade Assessment: III - A patient with                            severe systemic disease. After reviewing the risks                            and benefits, the patient was deemed in                            satisfactory condition to undergo the procedure.                           After obtaining informed consent, the endoscope was  passed under direct vision. Throughout the                            procedure, the patient's blood pressure, pulse, and                            oxygen saturations were monitored continuously. The                            Model GIF-[id number]) scope was introduced                            through the mouth, and advanced to the second part                            of duodenum. The  upper GI endoscopy was                            accomplished without difficulty. The patient                            tolerated the procedure well. Scope In: Scope Out: Findings:                 LA Grade A (one or more mucosal breaks less than 5                            mm, not extending between tops of 2 mucosal folds)                            esophagitis was found at the gastroesophageal                            junction.                           A 3 cm hiatal hernia was present (37-40 cm from the                            incisors).                           A low-grade of narrowing Schatzki ring (acquired)                            was found at 37 cm from the incisors at the                            gastroesophageal junction. A TTS dilator was passed                            through the scope. Dilation with a 16-17-18 mm  balloon dilator was performed to 18 mm. Some                            improvement noted at the level of the ring.                           The entire examined stomach was normal.                           The examined duodenum was normal. Complications:            No immediate complications. Estimated Blood Loss:     Estimated blood loss was minimal. Impression:               - LA Grade A reflux esophagitis.                           - 3 cm hiatal hernia.                           - Low-grade of narrowing Schatzki ring. Dilated.                           - Normal stomach.                           - Normal examined duodenum.                           - No specimens collected. Recommendation:           - Patient has a contact number available for                            emergencies. The signs and symptoms of potential                            delayed complications were discussed with the                            patient. Return to normal activities tomorrow.                            Written discharge  instructions were provided to the                            patient.                           - Soft diet today, then GERD diet thereafter.                           - Continue present medications including                            pantoprazole, though increase to 40 mg twice daily  before meals x 1 month, then once daily before                            breakfast thereafter.                           - Repeat upper endoscopy PRN for retreatment.                           - If symptoms fail to improve with increased PPI                            and after dilation, consider trial of diltiazem 30                            mg four times daily for probable esophageal spasm                           - Return to GI clinic at the next available                            appointment. Jerene Bears, MD 03/05/2016 8:33:18 AM This report has been signed electronically.

## 2016-03-05 NOTE — Progress Notes (Signed)
To pacu vss patent aw reprot to rn 

## 2016-03-05 NOTE — Patient Instructions (Signed)
Discharge instructions given. Handouts on a hiatal hernia and a dilatation diet. Resume previous medications. YOU HAD AN ENDOSCOPIC PROCEDURE TODAY AT THE [facility]:   Refer to the procedure report that was given to you for any specific questions about what was found during the examination.  If the procedure report does not answer your questions, please call your gastroenterologist to clarify.  If you requested that your care partner not be given the details of your procedure findings, then the procedure report has been included in a sealed envelope for you to review at your convenience later.  YOU SHOULD EXPECT: Some feelings of bloating in the abdomen. Passage of more gas than usual.  Walking can help get rid of the air that was put into your GI tract during the procedure and reduce the bloating. If you had a lower endoscopy (such as a colonoscopy or flexible sigmoidoscopy) you may notice spotting of blood in your stool or on the toilet paper. If you underwent a bowel prep for your procedure, you may not have a normal bowel movement for a few days.  Please Note:  You might notice some irritation and congestion in your nose or some drainage.  This is from the oxygen used during your procedure.  There is no need for concern and it should clear up in a day or so.  SYMPTOMS TO REPORT IMMEDIATELY:     Following upper endoscopy (EGD)  Vomiting of blood or coffee ground material  New chest pain or pain under the shoulder blades  Painful or persistently difficult swallowing  New shortness of breath  Fever of 100F or higher  Black, tarry-looking stools  For urgent or emergent issues, a gastroenterologist can be reached at any hour by calling [phone].   DIET:  We do recommend a small meal at first, but then you may proceed to your regular diet.  Drink plenty of fluids but you should avoid alcoholic beverages for 24 hours.  ACTIVITY:  You should plan to take it easy for the  rest of today and you should NOT DRIVE or use heavy machinery until tomorrow (because of the sedation medicines used during the test).    FOLLOW UP: Our staff will call the number listed on your records the next business day following your procedure to check on you and address any questions or concerns that you may have regarding the information given to you following your procedure. If we do not reach you, we will leave a message.  However, if you are feeling well and you are not experiencing any problems, there is no need to return our call.  We will assume that you have returned to your regular daily activities without incident.  If any biopsies were taken you will be contacted by phone or by letter within the next 1-3 weeks.  Please call us at [phone] if you have not heard about the biopsies in 3 weeks.    SIGNATURES/CONFIDENTIALITY: You and/or your care partner have signed paperwork which will be entered into your electronic medical record.  These signatures attest to the fact that that the information above on your After Visit Summary has been reviewed and is understood.  Full responsibility of the confidentiality of this discharge information lies with you and/or your care-partner. 

## 2016-03-06 ENCOUNTER — Telehealth: Payer: Self-pay | Admitting: *Deleted

## 2016-03-06 NOTE — Telephone Encounter (Signed)
Number identifier, left message, follow-up  

## 2016-03-10 ENCOUNTER — Ambulatory Visit: Payer: BLUE CROSS/BLUE SHIELD | Admitting: Family Medicine

## 2016-03-12 ENCOUNTER — Encounter: Payer: Self-pay | Admitting: Internal Medicine

## 2016-03-12 ENCOUNTER — Encounter: Payer: Self-pay | Admitting: Family Medicine

## 2016-03-12 DIAGNOSIS — R0683 Snoring: Secondary | ICD-10-CM

## 2016-03-13 ENCOUNTER — Telehealth: Payer: Self-pay

## 2016-03-13 NOTE — Telephone Encounter (Signed)
Pt aware.

## 2016-03-13 NOTE — Telephone Encounter (Signed)
Pt called stating she is having pain when she takes a deep breath. States this comes and goes. Pt states Dr. Hilarie Fredrickson mentioned another medication if she continued to have the discomfort.   - If symptoms fail to improve with increased PPI and after dilation, consider trial of diltiazem 30 mg four times daily for probable esophageal spasm - Return to GI clinic at the next available appointment.  Dr Ardis Hughs as doc of the day please advise.

## 2016-03-13 NOTE — Telephone Encounter (Signed)
Esophageal pains are not usually so pleuritic and so I'm not sure this is related to esophageal spasm.  I think she should be evaluated at PCP or urgent care for the pleuritic chest pain before starting further esophageal meds.

## 2016-03-15 ENCOUNTER — Emergency Department (HOSPITAL_BASED_OUTPATIENT_CLINIC_OR_DEPARTMENT_OTHER)
Admission: EM | Admit: 2016-03-15 | Discharge: 2016-03-15 | Disposition: A | Discharge status: Home / Self Care | Payer: BLUE CROSS/BLUE SHIELD | Attending: Emergency Medicine | Admitting: Emergency Medicine

## 2016-03-15 ENCOUNTER — Encounter (HOSPITAL_BASED_OUTPATIENT_CLINIC_OR_DEPARTMENT_OTHER): Payer: Self-pay | Admitting: Emergency Medicine

## 2016-03-15 ENCOUNTER — Emergency Department (HOSPITAL_BASED_OUTPATIENT_CLINIC_OR_DEPARTMENT_OTHER): Payer: BLUE CROSS/BLUE SHIELD

## 2016-03-15 DIAGNOSIS — I2510 Atherosclerotic heart disease of native coronary artery without angina pectoris: Secondary | ICD-10-CM | POA: Insufficient documentation

## 2016-03-15 DIAGNOSIS — R110 Nausea: Secondary | ICD-10-CM | POA: Diagnosis not present

## 2016-03-15 DIAGNOSIS — R079 Chest pain, unspecified: Secondary | ICD-10-CM | POA: Diagnosis not present

## 2016-03-15 DIAGNOSIS — R0789 Other chest pain: Secondary | ICD-10-CM | POA: Insufficient documentation

## 2016-03-15 DIAGNOSIS — Z79899 Other long term (current) drug therapy: Secondary | ICD-10-CM | POA: Insufficient documentation

## 2016-03-15 DIAGNOSIS — R0602 Shortness of breath: Secondary | ICD-10-CM | POA: Diagnosis not present

## 2016-03-15 DIAGNOSIS — I10 Essential (primary) hypertension: Secondary | ICD-10-CM | POA: Insufficient documentation

## 2016-03-15 LAB — HEPATIC FUNCTION PANEL
ALBUMIN: 4 g/dL (ref 3.5–5.0)
ALK PHOS: 67 U/L (ref 38–126)
ALT: 16 U/L (ref 14–54)
AST: 19 U/L (ref 15–41)
BILIRUBIN TOTAL: 1.2 mg/dL (ref 0.3–1.2)
Bilirubin, Direct: 0.2 mg/dL (ref 0.1–0.5)
Indirect Bilirubin: 1 mg/dL — ABNORMAL HIGH (ref 0.3–0.9)
Total Protein: 7.5 g/dL (ref 6.5–8.1)

## 2016-03-15 LAB — BASIC METABOLIC PANEL
Anion gap: 13 (ref 5–15)
BUN: 23 mg/dL — AB (ref 6–20)
CHLORIDE: 106 mmol/L (ref 101–111)
CO2: 21 mmol/L — AB (ref 22–32)
Calcium: 9.1 mg/dL (ref 8.9–10.3)
Creatinine, Ser: 1.67 mg/dL — ABNORMAL HIGH (ref 0.44–1.00)
GFR calc Af Amer: 39 mL/min — ABNORMAL LOW (ref >60)
GFR, EST NON AFRICAN AMERICAN: 34 mL/min — AB (ref >60)
Glucose, Bld: 94 mg/dL (ref 65–99)
Potassium: 3.2 mmol/L — ABNORMAL LOW (ref 3.5–5.1)
SODIUM: 140 mmol/L (ref 135–145)

## 2016-03-15 LAB — CBC
HEMATOCRIT: 36.7 % (ref 36.0–46.0)
HEMOGLOBIN: 12.1 g/dL (ref 12.0–15.0)
MCH: 27.4 pg (ref 26.0–34.0)
MCHC: 33 g/dL (ref 30.0–36.0)
MCV: 83 fL (ref 78.0–100.0)
Platelets: 159 10*3/uL (ref 150–400)
RBC: 4.42 MIL/uL (ref 3.87–5.11)
RDW: 14.1 % (ref 11.5–15.5)
WBC: 8.8 10*3/uL (ref 4.0–10.5)

## 2016-03-15 LAB — D-DIMER, QUANTITATIVE: D-Dimer, Quant: 0.33 ug/mL-FEU (ref 0.00–0.50)

## 2016-03-15 LAB — TROPONIN I: Troponin I: <0.03 ng/mL (ref <0.03)

## 2016-03-15 MED ORDER — GI COCKTAIL ~~LOC~~
30.0000 mL | Freq: Once | ORAL | Status: AC
  Administered 2016-03-15: 30 mL via ORAL
  Filled 2016-03-15: qty 30

## 2016-03-15 MED ORDER — SUCRALFATE 1 GM/10ML PO SUSP: 1.0000 g | Freq: Three times a day (TID) | ORAL | 0 refills | Status: DC

## 2016-03-15 MED ORDER — RANITIDINE HCL 150 MG/10ML PO SYRP
150.0000 mg | ORAL_SOLUTION | Freq: Once | ORAL | Status: AC
  Administered 2016-03-15: 150 mg via ORAL
  Filled 2016-03-15: qty 10

## 2016-03-15 MED ORDER — RANITIDINE HCL 150 MG PO TABS: 150.0000 mg | ORAL_TABLET | Freq: Two times a day (BID) | ORAL | 0 refills | Status: DC

## 2016-03-15 NOTE — ED Triage Notes (Addendum)
Patient reports she was seen at urgent care [facility] this morning due to chest pain, shortness of breath x 1.5 weeks.  Also reports dizziness, generalized body aches.  Reports worsening chest pain on deep inspiration.  Denies vomiting but states she has had nausea and diarrhea. Patient reports pain feels similar to pain when she had previous NSTEMI

## 2016-03-15 NOTE — ED Provider Notes (Signed)
[facility] DEPT [facility] Provider Note   CSN: [id number] Arrival date & time: 03/15/16  1105     History   Chief Complaint Chief Complaint  Patient presents with  . Chest Pain    HPI Christine Barrett is a 55 y.o. female.   Chest Pain   This is a new problem. The current episode started more than 1 week ago. The problem occurs daily. The pain is associated with breathing. The pain is present in the substernal region. The pain is at a severity of 7/10. The pain is moderate. The quality of the pain is described as brief. The pain does not radiate. Associated symptoms include nausea. Pertinent negatives include no abdominal pain, no diaphoresis, no fever, no palpitations and no vomiting. She has tried antacids for the symptoms. The treatment provided mild relief.  Her past medical history is significant for CAD and hyperlipidemia.    Past Medical History:  Diagnosis Date  . Allergy    SEASONAL  . Anemia   . CAD S/P percutaneous coronary angioplasty 09/2013   a) Ostial AV G Cx - 2.5 mm Angiosculpt; mid LAD 40-60%; b) Myoview 07/2014: LOW RISK, small-severe fixed inferior defect c/w infarct w/o peri-infarct ischemia.  . Chronic bronchitis ([place])    "frequently; not q yr" (10/03/2013)  . Essential hypertension    with prior Accelerated HTN  . Hx of non-ST elevation myocardial infarction (NSTEMI) 10/01/2013   Due to Accelerated HTN with existing CAD  . Hyperlipemia   . Migraine    "@ least once/month" (10/03/2013)  . Seasonal allergies   . Sinusitis     Patient Active Problem List   Diagnosis Date Noted  . Abdominal pain, epigastric 02/25/2016  . Leg cramps 02/07/2016  . Peripheral edema 01/29/2016  . Tinea pedis 01/29/2016  . Crescendo angina ([place]) 08/05/2015  . Exertional dyspnea 08/05/2015  . Essential hypertension   . Chronic bronchitis ([place])   . Influenza with respiratory manifestations 10/09/2014  . Dehydration 10/09/2014  . Dizziness and giddiness 10/09/2014  . GERD  (gastroesophageal reflux disease) 08/10/2014  . Dysphagia 08/10/2014  . Menopausal syndrome 08/10/2014  . Accelerated hypertension with heart disease and without congestive heart failure 10/03/2013  . CAD S/P percutaneous coronary angioplasty - Ostial AVG Cx - Scoring balloon PTCA 10/03/2013  . Hyperlipidemia with target LDL less than 70 10/02/2013  . H/O non-ST elevation myocardial infarction (NSTEMI) 10/02/2013  . Normocytic anemia, not due to blood loss   . Hypertensive urgency 10/01/2013  . Chest pain 10/01/2013  . CARPAL TUNNEL SYNDROME 09/02/2007  . Allergic rhinitis 03/17/2007  . MIGRAINE, COMMON W/O INTRACTABLE MIGRAINE 03/16/2007    Past Surgical History:  Procedure Laterality Date  . ABDOMINAL HYSTERECTOMY  1994   "partial"  . CARDIAC CATHETERIZATION N/A 08/05/2015   Procedure: Left Heart Cath and Coronary Angiography;  Surgeon: [name], MD;  Location: [facility];  Service: Cardiovascular;  Laterality: N/A;  . CESAREAN SECTION  1989  . COLONOSCOPY    . CORONARY ANGIOPLASTY  10/03/2013   95% ostial AV G Cx - 2.5 mm AngioSculpt Balloon PTCA; mid LAD 40-60%  . LEFT HEART CATHETERIZATION WITH CORONARY ANGIOGRAM N/A 10/02/2013   Procedure: LEFT HEART CATHETERIZATION WITH CORONARY ANGIOGRAM;  Surgeon: [name], MD;  Location: [facility] CATH LAB;  Service: Cardiovascular;  Laterality: N/A;  . NASAL SEPTOPLASTY W/ TURBINOPLASTY  ~ 2007  . NM MYOVIEW LTD  08/22/2014    Low risk stress nuclear study with a small, severe, fixed  defect in the distal inferior wall/apex suggestive of small prior infarct; no ischemia.  LV Wall Motion: NL LV Function; NL Wall Motion  . PERCUTANEOUS CORONARY STENT INTERVENTION (PCI-S) N/A 10/03/2013   cutting balloon angioplasty only no stent.   . TRANSTHORACIC ECHOCARDIOGRAM  10/02/2013   EF 55-60%; mild LVH, no RWMA,   . TUBAL LIGATION  1989    OB History    No data available       Home Medications    Prior to Admission medications     Medication Sig Start Date End Date Taking? Authorizing Provider  acetaminophen (TYLENOL) 500 MG tablet Take 1,000 mg by mouth every 6 (six) hours as needed for moderate pain or headache.    Historical Provider, MD  Albuterol Sulfate 108 (90 Base) MCG/ACT AEPB Inhale into the lungs. 12/15/15 12/14/16  Historical Provider, MD  aspirin EC 81 MG EC tablet Take 1 tablet (81 mg total) by mouth daily. 10/04/13   Geradine Girt, DO  atorvastatin (LIPITOR) 40 MG tablet Take 1 tablet (40 mg total) by mouth daily. 03/03/16   Amy Cletis Athens, MD  carvedilol (COREG) 25 MG tablet Take 1 tablet (25 mg total) by mouth 3 (three) times daily. 12/24/15   Amy Cletis Athens, MD  cetirizine (ZYRTEC) 10 MG tablet Take 10 mg by mouth daily.    Historical Provider, MD  chlorthalidone (HYGROTON) 50 MG tablet Take 1 tablet (50 mg total) by mouth daily. 02/11/16   Amy Cletis Athens, MD  fluticasone (FLONASE) 50 MCG/ACT nasal spray Place 1 spray into both nostrils 2 (two) times daily as needed for allergies or rhinitis.    Historical Provider, MD  isosorbide mononitrate (IMDUR) 120 MG 24 hr tablet Take 1 tablet (120 mg total) by mouth daily. 08/05/15   [name], MD  losartan (COZAAR) 100 MG tablet Take 1 tablet (100 mg total) by mouth daily. 02/24/16   Amy Cletis Athens, MD  pantoprazole (PROTONIX) 40 MG tablet Take 1 tablet (40 mg total) by mouth 2 (two) times daily before a meal. 03/05/16   Jerene Bears, MD  ranitidine (ZANTAC) 150 MG tablet Take 1 tablet (150 mg total) by mouth 2 (two) times daily. 03/15/16   Merrily Pew, MD  sucralfate (CARAFATE) 1 GM/10ML suspension Take 10 mLs (1 g total) by mouth 4 (four) times daily -  with meals and at bedtime. 03/15/16   Merrily Pew, MD    Family History Family History  Problem Relation Age of Onset  . Adopted: Yes  . Diabetes Mother   . Breast cancer Sister   . Hypertension Sister   . Colon cancer Neg Hx   . Heart attack Neg Hx   . Stroke Neg Hx     Social History Social History   Substance Use Topics  . Smoking status: Never Smoker  . Smokeless tobacco: Never Used  . Alcohol use No     Allergies   Penicillins   Review of Systems Review of Systems  Constitutional: Negative for chills, diaphoresis and fever.  HENT: Negative for congestion.   Eyes: Negative for pain.  Respiratory: Negative for chest tightness.   Cardiovascular: Positive for chest pain. Negative for palpitations and leg swelling.  Gastrointestinal: Positive for nausea. Negative for abdominal pain, diarrhea and vomiting.  Endocrine: Negative for polydipsia and polyuria.  Genitourinary: Negative for dysuria and flank pain.  All other systems reviewed and are negative.    Physical Exam Updated Vital Signs BP 110/69   Pulse  70   Temp 98.3 F (36.8 C) (Oral)   Resp 13   Ht 5' 9.5" (1.765 m)   Wt 281 lb (127.5 kg)   SpO2 97%   BMI 40.90 kg/m   Physical Exam  Constitutional: She appears well-developed and well-nourished. No distress.  HENT:  Head: Normocephalic and atraumatic.  Eyes: Conjunctivae are normal.  Neck: Neck supple.  Cardiovascular: Normal rate and regular rhythm.   No murmur heard. Pulmonary/Chest: Effort normal and breath sounds normal. No respiratory distress. She exhibits tenderness (mild over sternum).  Abdominal: Soft. There is no tenderness.  Musculoskeletal: She exhibits no edema.  Neurological: She is alert.  Skin: Skin is warm and dry.  Psychiatric: She has a normal mood and affect.  Nursing note and vitals reviewed.    ED Treatments / Results  Labs (all labs ordered are listed, but only abnormal results are displayed) Labs Reviewed  BASIC METABOLIC PANEL - Abnormal; Notable for the following:       Result Value   Potassium 3.2 (*)    CO2 21 (*)    BUN 23 (*)    Creatinine, Ser 1.67 (*)    GFR calc non Af Amer 34 (*)    GFR calc Af Amer 39 (*)    All other components within normal limits  HEPATIC FUNCTION PANEL - Abnormal; Notable for the  following:    Indirect Bilirubin 1.0 (*)    All other components within normal limits  TROPONIN I  CBC  TROPONIN I  D-DIMER, QUANTITATIVE (NOT AT [facility])    EKG  EKG Interpretation  Date/Time:  Sunday March 15 2016 11:17:54 EDT Ventricular Rate:  70 PR Interval:  138 QRS Duration: 94 QT Interval:  414 QTC Calculation: 447 R Axis:   6 Text Interpretation:  Normal sinus rhythm with sinus arrhythmia Inferior infarct , age undetermined Abnormal ECG No significant change since last tracing Confirmed by [facility] MD, Corene Cornea [id number]) on 03/15/2016 11:44:31 AM       Radiology Dg Chest 2 View  Result Date: 03/15/2016 CLINICAL DATA:  Patient with centralized chest pain for 2 weeks. Shortness of breath. EXAM: CHEST  2 VIEW COMPARISON:  Chest radiograph 07/19/2015 FINDINGS: The heart size and mediastinal contours are within normal limits. Both lungs are clear. The visualized skeletal structures are unremarkable. IMPRESSION: No active cardiopulmonary disease. Electronically Signed   By: Lovey Newcomer M.D.   On: 03/15/2016 12:10    Procedures Procedures (including critical care time)  Medications Ordered in ED Medications  gi cocktail (Maalox,Lidocaine,Donnatal) (30 mLs Oral Given 03/15/16 1230)  ranitidine (ZANTAC) 150 MG/10ML syrup 150 mg (150 mg Oral Given 03/15/16 1230)     Initial Impression / Assessment and Plan / ED Course  I have reviewed the triage vital signs and the nursing notes.  Pertinent labs & imaging results that were available during my care of the patient were reviewed by me and considered in my medical decision making (see chart for details).  Clinical Course    55 year old female history of coronary artery disease you have chest pain likely secondary to reflux. Delta troponins were negative making ACS unlikely. D-dimer was below the cutoff making a pulmonary embolus unlikely as well. It did significantly improve with the GI cocktail and she has a long history of hard to  treat reflux and it is likely the cause. She will try to get her follow-up with her gastroenterologist and she will work on getting that as an outpatient.  Final Clinical Impressions(s) /  ED Diagnoses   Final diagnoses:  Atypical chest pain    New Prescriptions Discharge Medication List as of 03/15/2016  5:12 PM    START taking these medications   Details  ranitidine (ZANTAC) 150 MG tablet Take 1 tablet (150 mg total) by mouth 2 (two) times daily., Starting Sun 03/15/2016, Print    sucralfate (CARAFATE) 1 GM/10ML suspension Take 10 mLs (1 g total) by mouth 4 (four) times daily -  with meals and at bedtime., Starting Sun 03/15/2016, Print         Merrily Pew, MD 03/15/16 1753

## 2016-03-15 NOTE — ED Notes (Signed)
Pt has had 3 total unsuccessful attempts at IV access. Okay to leave IV out at this time per MD.

## 2016-03-15 NOTE — ED Provider Notes (Signed)
Procedure note: Ultrasound Guided Peripheral IV Ultrasound guided peripheral 1.88 inch angiocath IV placement performed by me. Indications: Nursing unable to place IV. Details: The antecubital fossa and upper arm were evaluated with a multifrequency linear probe. Patent brachial veins were noted. 1 attempt was made to cannulate a vein under realtime US guidance with successful cannulation of the vein and catheter placement. There is return of non-pulsatile dark red blood. The patient tolerated the procedure well without complications. Images archived electronically.  CPT codes: [phone] and [place], DO 03/15/16 1536

## 2016-03-15 NOTE — ED Notes (Signed)
Richardson Landry, RN at bedside attempting ultrasound IV.

## 2016-03-15 NOTE — ED Notes (Signed)
Patient transported to X-ray 

## 2016-03-15 NOTE — ED Notes (Signed)
Second attempt at IV insertion -- vein infiltrated immediately with blood return.

## 2016-03-15 NOTE — ED Notes (Signed)
Blood obtained -- IV infiltrated while flushing with NS.

## 2016-03-15 NOTE — ED Notes (Signed)
Pt given d/c instructions as per chart. Rx x 2. Verbalizes understanding. No questions. 

## 2016-03-19 ENCOUNTER — Ambulatory Visit (INDEPENDENT_AMBULATORY_CARE_PROVIDER_SITE_OTHER): Payer: BLUE CROSS/BLUE SHIELD | Admitting: Family Medicine

## 2016-03-19 ENCOUNTER — Encounter: Payer: Self-pay | Admitting: Family Medicine

## 2016-03-19 ENCOUNTER — Telehealth: Payer: Self-pay | Admitting: *Deleted

## 2016-03-19 VITALS — BP 92/60 | HR 76 | Temp 98.5°F | Ht 69.0 in | Wt 281.5 lb

## 2016-03-19 DIAGNOSIS — I10 Essential (primary) hypertension: Secondary | ICD-10-CM

## 2016-03-19 DIAGNOSIS — K219 Gastro-esophageal reflux disease without esophagitis: Secondary | ICD-10-CM

## 2016-03-19 DIAGNOSIS — E860 Dehydration: Secondary | ICD-10-CM

## 2016-03-19 DIAGNOSIS — E876 Hypokalemia: Secondary | ICD-10-CM | POA: Diagnosis not present

## 2016-03-19 DIAGNOSIS — R42 Dizziness and giddiness: Secondary | ICD-10-CM

## 2016-03-19 DIAGNOSIS — N179 Acute kidney failure, unspecified: Secondary | ICD-10-CM | POA: Diagnosis not present

## 2016-03-19 DIAGNOSIS — R0789 Other chest pain: Secondary | ICD-10-CM

## 2016-03-19 LAB — BASIC METABOLIC PANEL
BUN: 15 mg/dL (ref 6–23)
CHLORIDE: 101 meq/L (ref 96–112)
CO2: 31 mEq/L (ref 19–32)
Calcium: 9.4 mg/dL (ref 8.4–10.5)
Creatinine, Ser: 1.01 mg/dL (ref 0.40–1.20)
GFR: 73.34 mL/min (ref >60.00)
GLUCOSE: 97 mg/dL (ref 70–99)
POTASSIUM: 3 meq/L — AB (ref 3.5–5.1)
Sodium: 138 mEq/L (ref 135–145)

## 2016-03-19 NOTE — Progress Notes (Signed)
   Subjective:    Patient ID: Christine Barrett, female    DOB: Apr 12, 1961, 55 y.o.   MRN: [id number]  HPI    55 year old female with history of CAD, high chol,. HTN and  presents for hospital follow up for chest pain, 9/17.  Felt to be secondary to GI issues. EKG stable  CXR: clear  Ddimer: neg  Troponins neg  Improved with GI cocktail. Started on ranitidine and sucralfate. In addition to her protonix.   Saw Pyrtle. 03/05/2016 EGD was done by GI.[name] Dilated esophagus.  Saw spasms of esophagus.  Today she reports  Still with intermittant chest pain.  Noted decrease kidney function to GFR of  39, Cr 1.67, 7 months ago cr was 0.7. She was dehydrated.. Not given IVF given could not get IV. Has ben having intermittent diarrhea in last  Week. Potassium was low.  She has been trying to drink more water.   Had been on losartan since 2015.[name] No issues.   New med is the chlorthalidone in last month. Has noted dizziness in last several weeks off and on.   BP Readings from Last 3 Encounters:  03/19/16 92/60  03/15/16 110/69  03/05/16 (!) 147/85      Review of Systems  Constitutional: Negative for fatigue and fever.  HENT: Negative for ear pain.   Eyes: Negative for pain.  Respiratory: Negative for chest tightness and shortness of breath.   Cardiovascular: Positive for chest pain. Negative for palpitations and leg swelling.  Gastrointestinal: Negative for abdominal pain.  Genitourinary: Negative for dysuria.       Objective:   Physical Exam  Constitutional: Vital signs are normal. She appears well-developed and well-nourished. She is cooperative.  Non-toxic appearance. She does not appear ill. No distress.  HENT:  Head: Normocephalic.  Right Ear: Hearing, tympanic membrane, external ear and ear canal normal. Tympanic membrane is not erythematous, not retracted and not bulging.  Left Ear: Hearing, tympanic membrane, external ear and ear canal normal. Tympanic membrane is not  erythematous, not retracted and not bulging.  Nose: No mucosal edema or rhinorrhea. Right sinus exhibits no maxillary sinus tenderness and no frontal sinus tenderness. Left sinus exhibits no maxillary sinus tenderness and no frontal sinus tenderness.  Mouth/Throat: Uvula is midline, oropharynx is clear and moist and mucous membranes are normal.  Eyes: Conjunctivae, EOM and lids are normal. Pupils are equal, round, and reactive to light. Lids are everted and swept, no foreign bodies found.  Neck: Trachea normal and normal range of motion. Neck supple. Carotid bruit is not present. No thyroid mass and no thyromegaly present.  Cardiovascular: Normal rate, regular rhythm, S1 normal, S2 normal, normal heart sounds, intact distal pulses and normal pulses.  Exam reveals no gallop and no friction rub.   No murmur heard. Pulmonary/Chest: Effort normal and breath sounds normal. No tachypnea. No respiratory distress. She has no decreased breath sounds. She has no wheezes. She has no rhonchi. She has no rales.  Abdominal: Soft. Normal appearance and bowel sounds are normal. There is no tenderness.  Neurological: She is alert.  Skin: Skin is warm, dry and intact. No rash noted.  Psychiatric: Her speech is normal and behavior is normal. Judgment and thought content normal. Her mood appears not anxious. Cognition and memory are normal. She does not exhibit a depressed mood.          Assessment & Plan:

## 2016-03-19 NOTE — Telephone Encounter (Signed)
Received fax from [facility] requesting PA for Carafate 1 gm/44ml suspension that was prescribed by Dr. Dayna Barker at [place] [facility].  PA completed on CoverMyMeds. PA is under review.

## 2016-03-19 NOTE — Progress Notes (Signed)
Pre visit review using our clinic review tool, if applicable. No additional management support is needed unless otherwise documented below in the visit note. 

## 2016-03-19 NOTE — Patient Instructions (Addendum)
Stop at lab on way out. Stop chlorthalidone. Drink adequate water.  Follow BP at home.  Can try ranitidine or carafate for GI issues.

## 2016-03-22 DIAGNOSIS — J209 Acute bronchitis, unspecified: Secondary | ICD-10-CM | POA: Diagnosis not present

## 2016-03-22 DIAGNOSIS — J0140 Acute pansinusitis, unspecified: Secondary | ICD-10-CM | POA: Diagnosis not present

## 2016-03-23 NOTE — Telephone Encounter (Signed)
PA for Carafate Suspension is denied.  Restricted access medication may be covered when two alternative medications on the member's formulary have been tried and did not work.  In this case, there is only one alternative that must be tried: sucralfate tablets.  Change to Carafate tablets?  Please advise.

## 2016-03-24 ENCOUNTER — Other Ambulatory Visit: Payer: Self-pay | Admitting: Family Medicine

## 2016-03-24 DIAGNOSIS — E876 Hypokalemia: Secondary | ICD-10-CM

## 2016-03-24 NOTE — Telephone Encounter (Signed)
Left message for Christine Barrett that her insurance denied the PA for Carafate.  Dr. [name] states to just continue as discussed at last OV.

## 2016-03-24 NOTE — Telephone Encounter (Signed)
No.. Doubt benefit will be great with this med. Continue as discussed at last OV.

## 2016-03-26 ENCOUNTER — Other Ambulatory Visit (INDEPENDENT_AMBULATORY_CARE_PROVIDER_SITE_OTHER): Payer: BLUE CROSS/BLUE SHIELD

## 2016-03-26 ENCOUNTER — Encounter: Payer: Self-pay | Admitting: Family Medicine

## 2016-03-26 DIAGNOSIS — E876 Hypokalemia: Secondary | ICD-10-CM

## 2016-03-26 LAB — BASIC METABOLIC PANEL
BUN: 20 mg/dL (ref 6–23)
CALCIUM: 8.8 mg/dL (ref 8.4–10.5)
CO2: 27 mEq/L (ref 19–32)
Chloride: 107 mEq/L (ref 96–112)
Creatinine, Ser: 0.83 mg/dL (ref 0.40–1.20)
GFR: 91.98 mL/min (ref >60.00)
GLUCOSE: 123 mg/dL — AB (ref 70–99)
Potassium: 3.6 mEq/L (ref 3.5–5.1)
SODIUM: 141 meq/L (ref 135–145)

## 2016-04-17 DIAGNOSIS — N179 Acute kidney failure, unspecified: Secondary | ICD-10-CM | POA: Insufficient documentation

## 2016-04-17 NOTE — Assessment & Plan Note (Signed)
Due for re-eval. Due to acute dehydration from overdiuresis.

## 2016-04-17 NOTE — Assessment & Plan Note (Signed)
Likely cause of GI issue. Add H2 blocker to regimen. Follow up with GI.

## 2016-04-17 NOTE — Assessment & Plan Note (Signed)
Likely due to GI issues.

## 2016-04-17 NOTE — Assessment & Plan Note (Addendum)
Likely due to overdiuresis.  resulted in ARF... Due for re-eval of Cr  And electrolytes today.

## 2016-04-17 NOTE — Assessment & Plan Note (Signed)
Due to overdiuresis.

## 2016-04-17 NOTE — Assessment & Plan Note (Signed)
Follow BP closely off chlorthalidone.

## 2016-05-08 ENCOUNTER — Ambulatory Visit (INDEPENDENT_AMBULATORY_CARE_PROVIDER_SITE_OTHER): Payer: BLUE CROSS/BLUE SHIELD | Admitting: Pulmonary Disease

## 2016-05-08 ENCOUNTER — Encounter: Payer: Self-pay | Admitting: *Deleted

## 2016-05-08 VITALS — BP 122/82 | HR 94 | Ht 69.5 in | Wt 283.0 lb

## 2016-05-08 DIAGNOSIS — R0683 Snoring: Secondary | ICD-10-CM

## 2016-05-08 DIAGNOSIS — Z6841 Body Mass Index (BMI) 40.0 and over, adult: Secondary | ICD-10-CM | POA: Diagnosis not present

## 2016-05-08 NOTE — Patient Instructions (Signed)
Will arrange for home sleep study Will call to arrange for follow up after sleep study reviewed  

## 2016-05-08 NOTE — Progress Notes (Signed)
   Subjective:    Patient ID: Christine Barrett, female    DOB: 1961/03/26, 55 y.o.   MRN: [id number]  HPI    Review of Systems  Constitutional: Negative for fever and unexpected weight change.  HENT: Positive for congestion and sneezing. Negative for dental problem, ear pain, nosebleeds, postnasal drip, rhinorrhea, sinus pressure, sore throat and trouble swallowing.   Eyes: Negative for redness and itching.  Respiratory: Positive for cough and shortness of breath. Negative for chest tightness and wheezing.   Cardiovascular: Negative for palpitations and leg swelling.  Gastrointestinal: Negative for nausea and vomiting.       Acid heartburn / indigestion  Genitourinary: Negative for dysuria.  Musculoskeletal: Positive for joint swelling.  Skin: Negative for rash ( itching).  Neurological: Negative for headaches.  Hematological: Does not bruise/bleed easily.  Psychiatric/Behavioral: Negative for dysphoric mood. The patient is not nervous/anxious.        Objective:   Physical Exam        Assessment & Plan:

## 2016-05-08 NOTE — Progress Notes (Signed)
Past Surgical History She  has a past surgical history that includes Cesarean section (1989); Nasal septoplasty w/ turbinoplasty (~ 2007); Coronary angioplasty (10/03/2013); Tubal ligation (1989); Abdominal hysterectomy (1994); left heart catheterization with coronary angiogram (N/A, 10/02/2013); percutaneous coronary stent intervention (pci-s) (N/A, 10/03/2013); transthoracic echocardiogram (10/02/2013); NM MYOVIEW LTD (08/22/2014); Cardiac catheterization (N/A, 08/05/2015); and Colonoscopy.  Allergies  Allergen Reactions  . Penicillins Rash    Pt states she has taken since intial  reaction without recurrence.     Family History Her family history includes Breast cancer in her sister; Diabetes in her mother; Hypertension in her sister. She was adopted.  Social History She  reports that she has never smoked. She has never used smokeless tobacco. She reports that she does not drink alcohol or use drugs.  Review of systems Constitutional: Negative for fever and unexpected weight change.  HENT: Positive for congestion and sneezing. Negative for dental problem, ear pain, nosebleeds, postnasal drip, rhinorrhea, sinus pressure, sore throat and trouble swallowing.   Eyes: Negative for redness and itching.  Respiratory: Positive for cough and shortness of breath. Negative for chest tightness and wheezing.   Cardiovascular: Negative for palpitations and leg swelling.  Gastrointestinal: Negative for nausea and vomiting.       Acid heartburn / indigestion  Genitourinary: Negative for dysuria.  Musculoskeletal: Positive for joint swelling.  Skin: Negative for rash ( itching).  Neurological: Negative for headaches.  Hematological: Does not bruise/bleed easily.  Psychiatric/Behavioral: Negative for dysphoric mood. The patient is not nervous/anxious.     Current Outpatient Prescriptions on File Prior to Visit  Medication Sig  . acetaminophen (TYLENOL) 500 MG tablet Take 1,000 mg by mouth every 6 (six) hours  as needed for moderate pain or headache.  . Albuterol Sulfate 108 (90 Base) MCG/ACT AEPB Inhale into the lungs.  [name] aspirin EC 81 MG EC tablet Take 1 tablet (81 mg total) by mouth daily.  [name] atorvastatin (LIPITOR) 40 MG tablet Take 1 tablet (40 mg total) by mouth daily.  . carvedilol (COREG) 25 MG tablet Take 1 tablet (25 mg total) by mouth 3 (three) times daily.  . cetirizine (ZYRTEC) 10 MG tablet Take 10 mg by mouth daily.  . fluticasone (FLONASE) 50 MCG/ACT nasal spray Place 1 spray into both nostrils 2 (two) times daily as needed for allergies or rhinitis.  [name] isosorbide mononitrate (IMDUR) 120 MG 24 hr tablet Take 1 tablet (120 mg total) by mouth daily.  [name] losartan (COZAAR) 100 MG tablet Take 1 tablet (100 mg total) by mouth daily.  . pantoprazole (PROTONIX) 40 MG tablet Take 1 tablet (40 mg total) by mouth 2 (two) times daily before a meal.   Current Facility-Administered Medications on File Prior to Visit  Medication  . 0.9 %  sodium chloride infusion    Chief Complaint  Patient presents with  . SLEEP CONSULT    Referred by Dr [name]. Epworth Score: 10    Cardiac tests Echo 10/02/13 >> mild LVH, EF 55 to 60%  Past medical history She  has a past medical history of Allergy; Anemia; CAD S/P percutaneous coronary angioplasty (09/2013); Chronic bronchitis ([place]); Diverticulosis; Essential hypertension; Hiatal hernia; non-ST elevation myocardial infarction (NSTEMI) (10/01/2013); Hyperlipemia; Migraine; Schatzki's ring; Seasonal allergies; and Sinusitis.  Vital signs BP 122/82 (BP Location: Left Arm, Cuff Size: Normal)   Pulse 94   Ht 5' 9.5" (1.765 m)   Wt 283 lb (128.4 kg)   SpO2 98%   BMI 41.19 kg/m   History of  Present Illness Christine Barrett is a 55 y.o. female for evaluation of sleep problems.  She has noticed trouble with her breathing while asleep.  She can't sleep laying flat >> feels like her breathing will get cut off.  She has woken up feeling herself choked.  Her husband  has told her she snores, and will stop breathing while asleep.  She will also talking in her sleep, and sometimes grinds her teeth while asleep.  She goes to sleep at 12 am.  She falls asleep in few minutes.  She wakes up several times during the night.  She gets out of bed at 7 am.  She always feels tired in the morning.  She denies morning headache.  She does not use anything to help her fall sleep or stay awake.  She can get sleepy while working on a computer or watching TV  She denies sleep walking, sleep talking, bruxism, or nightmares.  There is no history of restless legs.  She denies sleep hallucinations, sleep paralysis, or cataplexy.  The Epworth score is 10 out of 24.  Physical Exam:  General - No distress ENT - No sinus tenderness, no oral exudate, no LAN, no thyromegaly, TM clear, pupils equal/reactive, enlarged tongue, MP 4, scalloped tongue Cardiac - s1s2 regular, no murmur, pulses symmetric Chest - No wheeze/rales/dullness, good air entry, normal respiratory excursion Back - No focal tenderness Abd - Soft, non-tender, no organomegaly, + bowel sounds Ext - No edema Neuro - Normal strength, cranial nerves intact Skin - No rashes Psych - Normal mood, and behavior  Discussion: She has snoring, sleep disruption, witnessed apnea, and daytime sleepiness.  She has hx of CAD and HTN.  I am concerned she could have sleep apnea.  We discussed how sleep apnea can affect various health problems, including risks for hypertension, cardiovascular disease, and diabetes.  We also discussed how sleep disruption can increase risks for accidents, such as while driving.  Weight loss as a means of improving sleep apnea was also reviewed.  Additional treatment options discussed were CPAP therapy, oral appliance, and surgical intervention.  Assessment/plan:  Snoring with concern for obstructive sleep apnea. - will arrange for home sleep study  Obesity. - discussed options to assist with weight  loss   Patient Instructions  Will arrange for home sleep study Will call to arrange for follow up after sleep study reviewed     Christine Mires, M.D. Pager [phone] 05/08/2016, 4:13 PM

## 2016-05-14 ENCOUNTER — Telehealth: Payer: Self-pay | Admitting: Family Medicine

## 2016-05-14 DIAGNOSIS — E785 Hyperlipidemia, unspecified: Secondary | ICD-10-CM

## 2016-05-14 NOTE — Telephone Encounter (Signed)
-----   Message from Ellamae Sia sent at 05/13/2016 10:21 AM EST ----- Regarding: Lab orders for 11.20.17 Lab orders for a f/u appt

## 2016-05-18 ENCOUNTER — Other Ambulatory Visit (INDEPENDENT_AMBULATORY_CARE_PROVIDER_SITE_OTHER): Payer: BLUE CROSS/BLUE SHIELD

## 2016-05-18 DIAGNOSIS — E785 Hyperlipidemia, unspecified: Secondary | ICD-10-CM | POA: Diagnosis not present

## 2016-05-18 LAB — COMPREHENSIVE METABOLIC PANEL
ALT: 15 U/L (ref 0–35)
AST: 15 U/L (ref 0–37)
Albumin: 3.7 g/dL (ref 3.5–5.2)
Alkaline Phosphatase: 57 U/L (ref 39–117)
BUN: 13 mg/dL (ref 6–23)
CHLORIDE: 109 meq/L (ref 96–112)
CO2: 26 meq/L (ref 19–32)
Calcium: 9.2 mg/dL (ref 8.4–10.5)
Creatinine, Ser: 0.76 mg/dL (ref 0.40–1.20)
GFR: 101.77 mL/min (ref >60.00)
GLUCOSE: 128 mg/dL — AB (ref 70–99)
POTASSIUM: 3.9 meq/L (ref 3.5–5.1)
SODIUM: 143 meq/L (ref 135–145)
Total Bilirubin: 0.6 mg/dL (ref 0.2–1.2)
Total Protein: 6.6 g/dL (ref 6.0–8.3)

## 2016-05-18 LAB — LIPID PANEL
CHOL/HDL RATIO: 5
CHOLESTEROL: 198 mg/dL (ref 0–200)
HDL: 40 mg/dL (ref >39.00)
LDL CALC: 125 mg/dL — AB (ref 0–99)
NONHDL: 157.55
Triglycerides: 163 mg/dL — ABNORMAL HIGH (ref 0.0–149.0)
VLDL: 32.6 mg/dL (ref 0.0–40.0)

## 2016-05-25 ENCOUNTER — Encounter: Payer: Self-pay | Admitting: Family Medicine

## 2016-05-25 ENCOUNTER — Ambulatory Visit (INDEPENDENT_AMBULATORY_CARE_PROVIDER_SITE_OTHER): Payer: BLUE CROSS/BLUE SHIELD | Admitting: Family Medicine

## 2016-05-25 VITALS — BP 150/90 | HR 79 | Temp 98.0°F | Ht 69.0 in | Wt 288.5 lb

## 2016-05-25 DIAGNOSIS — E785 Hyperlipidemia, unspecified: Secondary | ICD-10-CM | POA: Diagnosis not present

## 2016-05-25 DIAGNOSIS — R7303 Prediabetes: Secondary | ICD-10-CM | POA: Diagnosis not present

## 2016-05-25 DIAGNOSIS — I10 Essential (primary) hypertension: Secondary | ICD-10-CM

## 2016-05-25 MED ORDER — LOSARTAN POTASSIUM-HCTZ 100-25 MG PO TABS: 1.0000 | ORAL_TABLET | Freq: Every day | ORAL | 3 refills | Status: DC

## 2016-05-25 NOTE — Progress Notes (Signed)
Pre visit review using our clinic review tool, if applicable. No additional management support is needed unless otherwise documented below in the visit note. 

## 2016-05-25 NOTE — Patient Instructions (Addendum)
Add HCTZ to the losartan for better blood pressure control. Do not use the chlorthalidone.  Follow BP at home.. Call dizzy or BP running > 140/90. Decrease sweet tea and juice. Decrease pasta and potatos.  Increase cardiovascular exercise.  Can use Cetaphil cream for dry skin.

## 2016-05-25 NOTE — Assessment & Plan Note (Signed)
Inadequate control. Chlorthalidone to strong for pt. Edema with amlodipine.  Add HCTZ back to losartan daily. Follow BP at home. Encouraged exercise, weight loss, healthy eating habits.

## 2016-05-25 NOTE — Progress Notes (Signed)
Subjective:    Patient ID: Christine Barrett, female    DOB: 1961/01/27, 55 y.o.   MRN: [id number]  HPI    55 year old female with history of  CAD, high cholesterol, HTN presents for 3 month foll ow up.   She has been having headache in alst 3 days, back on head. Occ blurry vision.  Has been taking Exedrine.. Headache relieved until this AM. She has noted I last week BP 165/95   Hypertension:  Now off chlorthalidone given dizziness from over diuresis.   ARF in 02/2016 now resolved and cr back at baseline off diuretic.  She is currently taking coreg 25 TID, cozaar 100 mg  In past SE of swelling with amlodipine. Using medication without problems or lightheadedness:  none Chest pain with exertion: None Edema: none Short of breath:  Occ.. using chlorthalidone prn. Average home BPs: see above Other issues:    Elevated Cholesterol:  Cholesterol much lower on lipitor 40 mg daily.  Pt is high risk with hx of CAD.  SE at higher dose lipitor. Lab Results  Component Value Date   CHOL 198 05/18/2016   HDL 40.00 05/18/2016   LDLCALC 125 (H) 05/18/2016   LDLDIRECT 192.2 02/11/2009   TRIG 163.0 (H) 05/18/2016   CHOLHDL 5 05/18/2016  Using medications without problems: Muscle aches:  Diet compliance: poor Exercise: none Other complaints:  Blood sugar was increased at recent check   new diagnosis of prediabetes.    Review of Systems  Constitutional: Negative for fatigue and fever.  HENT: Negative for ear pain.   Eyes: Negative for pain.  Respiratory: Negative for chest tightness and shortness of breath.   Cardiovascular: Negative for chest pain, palpitations and leg swelling.  Gastrointestinal: Negative for abdominal pain.  Genitourinary: Negative for dysuria.       Objective:   Physical Exam  Constitutional: Vital signs are normal. She appears well-developed and well-nourished. She is cooperative.  Non-toxic appearance. She does not appear ill. No distress.  Obese Body mass  index is 42.6 kg/m.   HENT:  Head: Normocephalic.  Right Ear: Hearing, tympanic membrane, external ear and ear canal normal. Tympanic membrane is not erythematous, not retracted and not bulging.  Left Ear: Hearing, tympanic membrane, external ear and ear canal normal. Tympanic membrane is not erythematous, not retracted and not bulging.  Nose: No mucosal edema or rhinorrhea. Right sinus exhibits no maxillary sinus tenderness and no frontal sinus tenderness. Left sinus exhibits no maxillary sinus tenderness and no frontal sinus tenderness.  Mouth/Throat: Uvula is midline, oropharynx is clear and moist and mucous membranes are normal.  Eyes: Conjunctivae, EOM and lids are normal. Pupils are equal, round, and reactive to light. Lids are everted and swept, no foreign bodies found.  Neck: Trachea normal and normal range of motion. Neck supple. Carotid bruit is not present. No thyroid mass and no thyromegaly present.  Cardiovascular: Normal rate, regular rhythm, S1 normal, S2 normal, normal heart sounds, intact distal pulses and normal pulses.  Exam reveals no gallop and no friction rub.   No murmur heard. Pulmonary/Chest: Effort normal and breath sounds normal. No tachypnea. No respiratory distress. She has no decreased breath sounds. She has no wheezes. She has no rhonchi. She has no rales.  Abdominal: Soft. Normal appearance and bowel sounds are normal. There is no tenderness.  Neurological: She is alert.  Skin: Skin is warm, dry and intact. No rash noted.  Psychiatric: Her speech is normal and behavior is normal. Judgment  and thought content normal. Her mood appears not anxious. Cognition and memory are normal. She does not exhibit a depressed mood.          Assessment & Plan:

## 2016-05-25 NOTE — Assessment & Plan Note (Addendum)
Pt intolerant of high dose statin.. Doing better on moderate dose statin lipitor 40 mg daily. LDL dropped 50 points. Pt not interested in starting zetia.  Info on low chol diet given.

## 2016-05-25 NOTE — Assessment & Plan Note (Signed)
Work on low [employer]., Info given.

## 2016-06-02 ENCOUNTER — Encounter: Payer: Self-pay | Admitting: Internal Medicine

## 2016-06-02 ENCOUNTER — Telehealth: Payer: Self-pay | Admitting: Cardiology

## 2016-06-02 ENCOUNTER — Ambulatory Visit (INDEPENDENT_AMBULATORY_CARE_PROVIDER_SITE_OTHER): Payer: BLUE CROSS/BLUE SHIELD | Admitting: Internal Medicine

## 2016-06-02 VITALS — BP 150/94 | HR 72 | Ht 69.0 in | Wt 286.2 lb

## 2016-06-02 DIAGNOSIS — R079 Chest pain, unspecified: Secondary | ICD-10-CM | POA: Diagnosis not present

## 2016-06-02 DIAGNOSIS — K219 Gastro-esophageal reflux disease without esophagitis: Secondary | ICD-10-CM

## 2016-06-02 DIAGNOSIS — K224 Dyskinesia of esophagus: Secondary | ICD-10-CM | POA: Diagnosis not present

## 2016-06-02 DIAGNOSIS — I25119 Atherosclerotic heart disease of native coronary artery with unspecified angina pectoris: Secondary | ICD-10-CM

## 2016-06-02 DIAGNOSIS — R1310 Dysphagia, unspecified: Secondary | ICD-10-CM

## 2016-06-02 MED ORDER — RANITIDINE HCL 150 MG PO TABS: 150.0000 mg | ORAL_TABLET | Freq: Every day | ORAL | 2 refills | Status: DC

## 2016-06-02 MED ORDER — NITROGLYCERIN 0.4 MG SL SUBL: 0.4000 mg | SUBLINGUAL_TABLET | SUBLINGUAL | 0 refills | Status: DC | PRN

## 2016-06-02 MED ORDER — PANTOPRAZOLE SODIUM 40 MG PO TBEC: 40.0000 mg | DELAYED_RELEASE_TABLET | Freq: Two times a day (BID) | ORAL | 2 refills | Status: DC

## 2016-06-02 NOTE — Telephone Encounter (Signed)
Per Dr. Venita Sheffield office, pt would like to change from Dr. Ellyn Hack to Dr. Angelena Form-

## 2016-06-02 NOTE — Progress Notes (Signed)
Subjective:    Patient ID: Christine Barrett, female    DOB: August 07, 1960, 55 y.o.   MRN: [id number]  HPI Christine Barrett is a 55 year old female with a past medical history of CAD, hypertension, hyperlipidemia, GERD, chest pain, esophagitis, Schatzki's ring status post dilation to 18 mm in September 2017 is here for follow-up. She has had issues with intermittent substernal chest pain. This occurs somewhat randomly and feels similar to her previous cardiac related pain. It is often in the center of her chest and radiates to her left arm. She does not use sublingual nitroglycerin but takes isosorbide mononitrate daily. The pain is not always exertional. Her dysphagia symptoms have improved after dilation in September. She was taking pantoprazole 40 mg daily increased to twice daily after endoscopy. She had another episode of chest pain and was seen in the ER. Ruled out for ACS. Gadolinium GI cocktail which seemed to help. Ranitidine was added and she is using 150 twice a day. This seemed to help significantly though last week she had another episode of substernal chest pain despite pantoprazole once daily and ranitidine twice daily. She does endorse stress with work, school, impending graduation and needing to be up late at night to do work. She reports regular bowel habits without blood in her stool or melena. No odynophagia. Again dysphagia has improved. No nausea or vomiting. No abdominal pain  She was previously seen by Dr. Ellyn Hack who performed her cardiac catheterization February. She got another cardiology opinion at [facility] that she felt like communication with Dr. Ellyn Hack was not ideal. She would like to see cardiology locally and requested referral to a different cardiologist.   Review of Systems As per history of present illness, otherwise negative  Current Medications, Allergies, Past Medical History, Past Surgical History, Family History and Social History were reviewed in [place] record.  mpn   Objective:   Physical Exam BP (!) 150/94   Pulse 72   Ht 5\' 9"  (1.753 m)   Wt 286 lb 4 oz (129.8 kg)   BMI 42.27 kg/m  Constitutional: Well-developed and well-nourished. No distress. HEENT: Normocephalic and atraumatic. Oropharynx is clear and moist. No oropharyngeal exudate. Conjunctivae are normal.  No scleral icterus. Neck: Neck supple. Trachea midline. Cardiovascular: Normal rate, regular rhythm and intact distal pulses. No M/R/G Pulmonary/chest: Effort normal and breath sounds normal. No wheezing, rales or rhonchi. Abdominal: Soft, nontender, nondistended. Bowel sounds active throughout. There are no masses palpable. No hepatosplenomegaly. Extremities: no clubbing, cyanosis, or edema Lymphadenopathy: No cervical adenopathy noted. Neurological: Alert and oriented to person place and time. Skin: Skin is warm and dry. No rashes noted. Psychiatric: Normal mood and affect. Behavior is normal.  CBC    Component Value Date/Time   WBC 8.8 03/15/2016 1420   RBC 4.42 03/15/2016 1420   HGB 12.1 03/15/2016 1420   HCT 36.7 03/15/2016 1420   PLT 159 03/15/2016 1420   MCV 83.0 03/15/2016 1420   MCH 27.4 03/15/2016 1420   MCHC 33.0 03/15/2016 1420   RDW 14.1 03/15/2016 1420   LYMPHSABS 3.3 08/02/2015 1228   MONOABS 0.8 08/02/2015 1228   EOSABS 0.3 08/02/2015 1228   BASOSABS 0.0 08/02/2015 1228   CMP     Component Value Date/Time   NA 143 05/18/2016 0823   K 3.9 05/18/2016 0823   CL 109 05/18/2016 0823   CO2 26 05/18/2016 0823   GLUCOSE 128 (H) 05/18/2016 0823   BUN 13 05/18/2016 0823   CREATININE 0.76  05/18/2016 0823   CREATININE 0.70 08/02/2015 1228   CALCIUM 9.2 05/18/2016 0823   PROT 6.6 05/18/2016 0823   ALBUMIN 3.7 05/18/2016 0823   AST 15 05/18/2016 0823   ALT 15 05/18/2016 0823   ALKPHOS 57 05/18/2016 0823   BILITOT 0.6 05/18/2016 0823   GFRNONAA 34 (L) 03/15/2016 1420   GFRNONAA >89 07/16/2015 0920   GFRAA 39 (L) 03/15/2016 1420   GFRAA  >89 07/16/2015 0920       Assessment & Plan:  55 year old female with a past medical history of CAD, hypertension, hyperlipidemia, GERD, chest pain, esophagitis, Schatzki's ring status post dilation to 18 mm in September 2017 is here for follow-up.  1. GERD/esophagitis -- This is a difficult situation because she clearly has evidence of reflux with esophagitis but also known CAD. Her pain is similar to previous cardiac pain and radiates to the left arm. She seems to have responded well to addition of H2 blocker but is still had an episode of chest pain recently. We discussed esophageal manometry but I'm not certain how this would change management. For now I will have her continue pantoprazole 40 mg daily and ranitidine 150 mg twice a day. For breakthrough reflux or chest pain I suggested that she try Gaviscon over-the-counter per bottle instruction. If this fails to improve symptoms there is a possibility for esophageal spasm. I'm prescribing sublingual nitroglycerin 0.4 mg tablets to be placed under the tongue and repeated in 5 minutes 3 if not improving. If pain persists after 3 doses I recommended she seek care emergently by calling 911. See #2  2. CAD/chest pain -- she has known coronary artery disease and pain which could represent angina rather than GERD or spasm. I have strongly recommended that she reestablish care with cardiology for ongoing medical management of her known heart disease. Sublingual nitroglycerin provided as discussed in #1 which would help both angina pain and esophageal pain/spasm. She is referred back to cardiology today. She understands to call 911 if pain fails to improve with treatment.  Three-month follow-up, sooner if necessary 25 minutes spent with the patient today. Greater than 50% was spent in counseling and coordination of care with the patient

## 2016-06-02 NOTE — Patient Instructions (Addendum)
We have sent the following medications to your pharmacy for you to pick up at your convenience: protonix once daily Ranitidine 150 mg twice daily Nitroglycerin 0.4 mg-1 under the tongue every 5 minutes for substernal chest pain radiating to left arm. May use up to 3 tablets. If more required, call 911 IMMEDIATELY.  Please purchase the following medications over the counter and take as directed: Gaviscon as needed  Please follow up with Dr Hilarie Fredrickson in 3 months.  I have spoken to [facility] who states that she will send Dr Ellyn Hack a note asking to let you switch to Dr Julianne Handler. Once that has been done, she states that she will call the patient to schedule an appointment. If you do not hear from their office within the next week, please call our office at [phone] and we will contact them again.  If you are age 77 or older, your body mass index should be between 23-30. Your Body mass index is 42.27 kg/m. If this is out of the aforementioned range listed, please consider follow up with your Primary Care Provider.  If you are age 26 or younger, your body mass index should be between 19-25. Your Body mass index is 42.27 kg/m. If this is out of the aformentioned range listed, please consider follow up with your Primary Care Provider.

## 2016-06-03 NOTE — Telephone Encounter (Signed)
Perfectly fine with me.  I would hate to think that she thinks communicating with me is difficult.  Glenetta Hew, MD

## 2016-06-04 NOTE — Telephone Encounter (Signed)
OK with me. I have never known Dr. Ellyn Hack to be unwilling to talk.Christine KitchenMarland Barrett

## 2016-06-05 DIAGNOSIS — G4733 Obstructive sleep apnea (adult) (pediatric): Secondary | ICD-10-CM | POA: Diagnosis not present

## 2016-06-09 ENCOUNTER — Encounter (HOSPITAL_COMMUNITY): Payer: Self-pay | Admitting: Emergency Medicine

## 2016-06-09 ENCOUNTER — Observation Stay (HOSPITAL_COMMUNITY)
Admission: EM | Admit: 2016-06-09 | Discharge: 2016-06-11 | Disposition: A | Discharge status: Home / Self Care | Payer: BLUE CROSS/BLUE SHIELD | Attending: Internal Medicine | Admitting: Internal Medicine

## 2016-06-09 ENCOUNTER — Telehealth: Payer: Self-pay | Admitting: Family Medicine

## 2016-06-09 ENCOUNTER — Emergency Department (HOSPITAL_COMMUNITY): Payer: BLUE CROSS/BLUE SHIELD

## 2016-06-09 DIAGNOSIS — R079 Chest pain, unspecified: Secondary | ICD-10-CM

## 2016-06-09 DIAGNOSIS — I2510 Atherosclerotic heart disease of native coronary artery without angina pectoris: Secondary | ICD-10-CM | POA: Diagnosis not present

## 2016-06-09 DIAGNOSIS — E785 Hyperlipidemia, unspecified: Secondary | ICD-10-CM | POA: Diagnosis not present

## 2016-06-09 DIAGNOSIS — K219 Gastro-esophageal reflux disease without esophagitis: Secondary | ICD-10-CM | POA: Diagnosis not present

## 2016-06-09 DIAGNOSIS — Z9861 Coronary angioplasty status: Secondary | ICD-10-CM | POA: Diagnosis not present

## 2016-06-09 DIAGNOSIS — I252 Old myocardial infarction: Secondary | ICD-10-CM | POA: Insufficient documentation

## 2016-06-09 DIAGNOSIS — M940 Chondrocostal junction syndrome [Tietze]: Secondary | ICD-10-CM | POA: Diagnosis present

## 2016-06-09 DIAGNOSIS — Z79899 Other long term (current) drug therapy: Secondary | ICD-10-CM | POA: Diagnosis not present

## 2016-06-09 DIAGNOSIS — I10 Essential (primary) hypertension: Secondary | ICD-10-CM | POA: Insufficient documentation

## 2016-06-09 DIAGNOSIS — R0789 Other chest pain: Principal | ICD-10-CM | POA: Insufficient documentation

## 2016-06-09 DIAGNOSIS — E1159 Type 2 diabetes mellitus with other circulatory complications: Secondary | ICD-10-CM | POA: Diagnosis present

## 2016-06-09 DIAGNOSIS — I152 Hypertension secondary to endocrine disorders: Secondary | ICD-10-CM | POA: Diagnosis present

## 2016-06-09 LAB — BASIC METABOLIC PANEL
Anion gap: 8 (ref 5–15)
BUN: 13 mg/dL (ref 6–20)
CHLORIDE: 102 mmol/L (ref 101–111)
CO2: 27 mmol/L (ref 22–32)
Calcium: 9.4 mg/dL (ref 8.9–10.3)
Creatinine, Ser: 0.68 mg/dL (ref 0.44–1.00)
GFR calc non Af Amer: >60 mL/min (ref >60)
Glucose, Bld: 134 mg/dL — ABNORMAL HIGH (ref 65–99)
POTASSIUM: 3.5 mmol/L (ref 3.5–5.1)
SODIUM: 137 mmol/L (ref 135–145)

## 2016-06-09 LAB — CBC
HEMATOCRIT: 40.5 % (ref 36.0–46.0)
HEMATOCRIT: 39.1 % (ref 36.0–46.0)
HEMOGLOBIN: 13.2 g/dL (ref 12.0–15.0)
HEMOGLOBIN: 12.4 g/dL (ref 12.0–15.0)
MCH: 27.3 pg (ref 26.0–34.0)
MCH: 26.6 pg (ref 26.0–34.0)
MCHC: 32.6 g/dL (ref 30.0–36.0)
MCHC: 31.7 g/dL (ref 30.0–36.0)
MCV: 83.9 fL (ref 78.0–100.0)
MCV: 83.7 fL (ref 78.0–100.0)
Platelets: 197 10*3/uL (ref 150–400)
Platelets: 262 10*3/uL (ref 150–400)
RBC: 4.83 MIL/uL (ref 3.87–5.11)
RBC: 4.67 MIL/uL (ref 3.87–5.11)
RDW: 14.1 % (ref 11.5–15.5)
RDW: 13.9 % (ref 11.5–15.5)
WBC: 6 10*3/uL (ref 4.0–10.5)
WBC: 7.1 10*3/uL (ref 4.0–10.5)

## 2016-06-09 LAB — CREATININE, SERUM
Creatinine, Ser: 0.74 mg/dL (ref 0.44–1.00)
GFR calc Af Amer: >60 mL/min (ref >60)

## 2016-06-09 LAB — I-STAT TROPONIN, ED
TROPONIN I, POC: 0.01 ng/mL (ref 0.00–0.08)
Troponin i, poc: 0 ng/mL (ref 0.00–0.08)

## 2016-06-09 LAB — MRSA PCR SCREENING: MRSA BY PCR: NEGATIVE

## 2016-06-09 LAB — TROPONIN I: Troponin I: <0.03 ng/mL (ref <0.03)

## 2016-06-09 MED ORDER — HYDROCHLOROTHIAZIDE 25 MG PO TABS
25.0000 mg | ORAL_TABLET | Freq: Every day | ORAL | Status: DC
  Administered 2016-06-10 – 2016-06-11 (×2): 25 mg via ORAL
  Filled 2016-06-09 (×2): qty 1

## 2016-06-09 MED ORDER — MORPHINE SULFATE (PF) 2 MG/ML IV SOLN
2.0000 mg | Freq: Once | INTRAVENOUS | Status: AC
  Administered 2016-06-09: 2 mg via INTRAVENOUS
  Filled 2016-06-09: qty 1

## 2016-06-09 MED ORDER — CARVEDILOL 25 MG PO TABS
25.0000 mg | ORAL_TABLET | Freq: Two times a day (BID) | ORAL | Status: DC
  Administered 2016-06-09 – 2016-06-11 (×4): 25 mg via ORAL
  Filled 2016-06-09 (×4): qty 1

## 2016-06-09 MED ORDER — PANTOPRAZOLE SODIUM 40 MG PO TBEC
40.0000 mg | DELAYED_RELEASE_TABLET | Freq: Two times a day (BID) | ORAL | Status: DC
  Administered 2016-06-09 – 2016-06-11 (×4): 40 mg via ORAL
  Filled 2016-06-09 (×4): qty 1

## 2016-06-09 MED ORDER — FLUTICASONE PROPIONATE 50 MCG/ACT NA SUSP: 2.0000 | Freq: Every day | NASAL | Status: DC | PRN

## 2016-06-09 MED ORDER — ASPIRIN EC 325 MG PO TBEC
325.0000 mg | DELAYED_RELEASE_TABLET | Freq: Once | ORAL | Status: AC
  Administered 2016-06-09: 325 mg via ORAL
  Filled 2016-06-09: qty 1

## 2016-06-09 MED ORDER — ASPIRIN 81 MG PO TBEC: 81.0000 mg | DELAYED_RELEASE_TABLET | Freq: Every day | ORAL | Status: DC

## 2016-06-09 MED ORDER — LOSARTAN POTASSIUM-HCTZ 100-25 MG PO TABS: 1.0000 | ORAL_TABLET | Freq: Every day | ORAL | Status: DC

## 2016-06-09 MED ORDER — LORATADINE 10 MG PO TABS
10.0000 mg | ORAL_TABLET | Freq: Every day | ORAL | Status: DC
  Administered 2016-06-10 – 2016-06-11 (×2): 10 mg via ORAL
  Filled 2016-06-09 (×2): qty 1

## 2016-06-09 MED ORDER — ISOSORBIDE MONONITRATE ER 60 MG PO TB24
120.0000 mg | ORAL_TABLET | Freq: Every day | ORAL | Status: DC
  Administered 2016-06-10 – 2016-06-11 (×2): 120 mg via ORAL
  Filled 2016-06-09 (×2): qty 2

## 2016-06-09 MED ORDER — ACETAMINOPHEN 325 MG PO TABS: 650.0000 mg | ORAL_TABLET | ORAL | Status: DC | PRN

## 2016-06-09 MED ORDER — ATORVASTATIN CALCIUM 40 MG PO TABS
40.0000 mg | ORAL_TABLET | Freq: Every day | ORAL | Status: DC
  Administered 2016-06-09 – 2016-06-10 (×2): 40 mg via ORAL
  Filled 2016-06-09 (×3): qty 1

## 2016-06-09 MED ORDER — NITROGLYCERIN 0.4 MG SL SUBL: 0.4000 mg | SUBLINGUAL_TABLET | SUBLINGUAL | Status: DC | PRN

## 2016-06-09 MED ORDER — FAMOTIDINE 20 MG PO TABS
20.0000 mg | ORAL_TABLET | Freq: Every day | ORAL | Status: DC
  Administered 2016-06-10 – 2016-06-11 (×2): 20 mg via ORAL
  Filled 2016-06-09 (×2): qty 1

## 2016-06-09 MED ORDER — LOSARTAN POTASSIUM 50 MG PO TABS
100.0000 mg | ORAL_TABLET | Freq: Every day | ORAL | Status: DC
  Administered 2016-06-10 – 2016-06-11 (×2): 100 mg via ORAL
  Filled 2016-06-09 (×2): qty 2

## 2016-06-09 MED ORDER — ASPIRIN EC 81 MG PO TBEC
81.0000 mg | DELAYED_RELEASE_TABLET | Freq: Every day | ORAL | Status: DC
  Administered 2016-06-10 – 2016-06-11 (×2): 81 mg via ORAL
  Filled 2016-06-09 (×2): qty 1

## 2016-06-09 MED ORDER — ALBUTEROL SULFATE 108 (90 BASE) MCG/ACT IN AEPB: 2.0000 | INHALATION_SPRAY | Freq: Four times a day (QID) | RESPIRATORY_TRACT | Status: DC | PRN

## 2016-06-09 MED ORDER — ADULT MULTIVITAMIN W/MINERALS CH
1.0000 | ORAL_TABLET | Freq: Every day | ORAL | Status: DC
  Administered 2016-06-10 – 2016-06-11 (×2): 1 via ORAL
  Filled 2016-06-09 (×2): qty 1

## 2016-06-09 MED ORDER — ONDANSETRON HCL 4 MG/2ML IJ SOLN: 4.0000 mg | Freq: Four times a day (QID) | INTRAMUSCULAR | Status: DC | PRN

## 2016-06-09 MED ORDER — MORPHINE SULFATE (PF) 2 MG/ML IV SOLN
1.0000 mg | INTRAVENOUS | Status: DC | PRN
  Administered 2016-06-09: 1 mg via INTRAVENOUS
  Filled 2016-06-09: qty 1

## 2016-06-09 MED ORDER — ALUM HYDROXIDE-MAG CARBONATE 95-358 MG/15ML PO SUSP
15.0000 mL | Freq: Four times a day (QID) | ORAL | Status: DC | PRN
  Filled 2016-06-09: qty 15

## 2016-06-09 MED ORDER — ALBUTEROL SULFATE (2.5 MG/3ML) 0.083% IN NEBU: 2.5000 mg | INHALATION_SOLUTION | Freq: Four times a day (QID) | RESPIRATORY_TRACT | Status: DC | PRN

## 2016-06-09 MED ORDER — HEPARIN SODIUM (PORCINE) 5000 UNIT/ML IJ SOLN
5000.0000 [IU] | Freq: Three times a day (TID) | INTRAMUSCULAR | Status: DC
  Administered 2016-06-09 – 2016-06-11 (×5): 5000 [IU] via SUBCUTANEOUS
  Filled 2016-06-09 (×5): qty 1

## 2016-06-09 NOTE — Telephone Encounter (Signed)
Per chart review tab pt is at [facility] ED now. 

## 2016-06-09 NOTE — ED Provider Notes (Signed)
[place] DEPT Provider Note   CSN: [id number] Arrival date & time: 06/09/16  [id number]     History   Chief Complaint Chief Complaint  Patient presents with  . Chest Pain    HPI Christine Barrett is a 55 y.o. female.  Christine Barrett is a 55 y.o. female with h/o GERD, HTN, HLD, pre-diabetes, NSTEMI 2015 presents to ED with chest pain. Patient reports she has had off/on chest pain for the last year. Most recent episode started on Sunday, intermittent. Pain became constant since yesterday afternoon. Pain is centrally located with radiation into left arm, squeezing in sensation. She reports associated lightheadedness, dizziness, and tingling in her left arm. States these symptoms feel similar to when she had her NSTEMI. She also reports she had jaw pain yesterday. Patient took 3 NTG prior to arrival with minimal relief of sxs. She also has b/l lower extremity swelling that worsens throughout the day and improves with elevation. She denies fever, diaphoresis, SOB, cough, abdomina complaints, urinary complains, facial droop, slurred speech, or any other complaints. No recent long distance travel/surgery/hospitalization, h/o blood clots, h/o cancer, hemoptysis, or hormone therapy. Pt followed initially by Dr. Ellyn Hack of Cardiology, looks to be switching to Dr. Angelena Form.       Past Medical History:  Diagnosis Date  . Allergy    SEASONAL  . Anemia   . CAD S/P percutaneous coronary angioplasty 09/2013   a) Ostial AV G Cx - 2.5 mm Angiosculpt; mid LAD 40-60%; b) Myoview 07/2014: LOW RISK, small-severe fixed inferior defect c/w infarct w/o peri-infarct ischemia.  . Chronic bronchitis ([place])    "frequently; not q yr" (10/03/2013)  . Diverticulosis   . Essential hypertension    with prior Accelerated HTN  . Hiatal hernia   . Hx of non-ST elevation myocardial infarction (NSTEMI) 10/01/2013   Due to Accelerated HTN with existing CAD  . Hyperlipemia   . Migraine    "@ least once/month" (10/03/2013)  .  Schatzki's ring   . Seasonal allergies   . Sinusitis     Patient Active Problem List   Diagnosis Date Noted  . Chest pain 06/09/2016  . Prediabetes 05/25/2016  . 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 Essential hypertension   . Chronic bronchitis ([place])   . GERD (gastroesophageal reflux disease) 08/10/2014  . Menopausal syndrome 08/10/2014  . Accelerated hypertension with heart disease and without congestive heart failure 10/03/2013  . CAD S/P percutaneous coronary angioplasty - Ostial AVG Cx - Scoring balloon PTCA 10/03/2013  . Hyperlipidemia with target LDL less than 70 10/02/2013  . H/O non-ST elevation myocardial infarction (NSTEMI) 10/02/2013  . Normocytic anemia, not due to blood loss   . Hypertensive urgency 10/01/2013  . CARPAL TUNNEL SYNDROME 09/02/2007  . Allergic rhinitis 03/17/2007  . MIGRAINE, COMMON W/O INTRACTABLE MIGRAINE 03/16/2007    Past Surgical History:  Procedure Laterality Date  . ABDOMINAL HYSTERECTOMY  1994   "partial"  . CARDIAC CATHETERIZATION N/A 08/05/2015   Procedure: Left Heart Cath and Coronary Angiography;  Surgeon: [name], MD;  Location: [place] CV LAB;  Service: Cardiovascular;  Laterality: N/A;  . CESAREAN SECTION  1989  . COLONOSCOPY    . CORONARY ANGIOPLASTY  10/03/2013   95% ostial AV G Cx - 2.5 mm AngioSculpt Balloon PTCA; mid LAD 40-60%  . LEFT HEART CATHETERIZATION WITH CORONARY ANGIOGRAM N/A 10/02/2013   Procedure: LEFT HEART CATHETERIZATION WITH CORONARY ANGIOGRAM;  Surgeon: [name], MD;  Location: [facility] CATH LAB;  Service: Cardiovascular;  Laterality: N/A;  . NASAL SEPTOPLASTY W/ TURBINOPLASTY  ~ 2007  . NM MYOVIEW LTD  08/22/2014    Low risk stress nuclear study with a small, severe, fixed defect in the distal inferior wall/apex suggestive of small prior infarct; no ischemia.  LV Wall Motion: NL LV Function; NL Wall Motion  . PERCUTANEOUS CORONARY STENT INTERVENTION (PCI-S) N/A 10/03/2013   cutting balloon angioplasty only  no stent.   . TRANSTHORACIC ECHOCARDIOGRAM  10/02/2013   EF 55-60%; mild LVH, no RWMA,   . TUBAL LIGATION  1989    OB History    No data available       Home Medications    Prior to Admission medications   Medication Sig Start Date End Date Taking? Authorizing Provider  Albuterol Sulfate 108 (90 Base) MCG/ACT AEPB Inhale 2 puffs into the lungs every 6 (six) hours as needed (shortness of breath).  12/15/15 12/14/16 Yes Historical Provider, MD  aluminum hydroxide-magnesium carbonate (GAVISCON) 95-358 MG/15ML SUSP Take 15 mLs by mouth every 6 (six) hours as needed for heartburn.   Yes Historical Provider, MD  aspirin EC 81 MG EC tablet Take 1 tablet (81 mg total) by mouth daily. 10/04/13  Yes Geradine Girt, DO  atorvastatin (LIPITOR) 40 MG tablet Take 1 tablet (40 mg total) by mouth daily. 03/03/16  Yes Amy Cletis [place], MD  carvedilol (COREG) 25 MG tablet Take 1 tablet (25 mg total) by mouth 3 (three) times daily. Patient taking differently: Take 25 mg by mouth 2 (two) times daily with a meal.  12/24/15  Yes [name] [name], MD  cetirizine (ZYRTEC) 10 MG tablet Take 10 mg by mouth daily.   Yes Historical Provider, MD  fluticasone (FLONASE) 50 MCG/ACT nasal spray Place 2 sprays into both nostrils daily as needed for allergies or rhinitis.    Yes Historical Provider, MD  isosorbide mononitrate (IMDUR) 120 MG 24 hr tablet Take 1 tablet (120 mg total) by mouth daily. 08/05/15  Yes [name], MD  losartan-hydrochlorothiazide (HYZAAR) 100-25 MG tablet Take 1 tablet by mouth daily. 05/25/16  Yes Amy Cletis [place], MD  Multiple Vitamin (MULTIVITAMIN WITH MINERALS) TABS tablet Take 1 tablet by mouth daily.   Yes Historical Provider, MD  Multiple Vitamins-Minerals (EMERGEN-C IMMUNE) PACK Take 1 packet by mouth daily.   Yes Historical Provider, MD  nitroGLYCERIN (NITROSTAT) 0.4 MG SL tablet Place 1 tablet (0.4 mg total) under the tongue every 5 (five) minutes as needed for chest pain. May take up to 3 tablets. If  still having chest pain, call 911 immediately. 06/02/16  Yes Jerene Bears, MD  pantoprazole (PROTONIX) 40 MG tablet Take 1 tablet (40 mg total) by mouth 2 (two) times daily before a meal. 06/02/16  Yes Jerene Bears, MD  ranitidine (ZANTAC) 150 MG tablet Take 1 tablet (150 mg total) by mouth daily. 06/02/16  Yes Jerene Bears, MD    Family History Family History  Problem Relation Age of Onset  . Adopted: Yes  . Diabetes Mother   . Breast cancer Sister   . Hypertension Sister   . Colon cancer Neg Hx   . Heart attack Neg Hx   . Stroke Neg Hx     Social History Social History  Substance Use Topics  . Smoking status: Never Smoker  . Smokeless tobacco: Never Used  . Alcohol use No     Allergies   Penicillins   Review of Systems Review of Systems  Constitutional: Negative for chills,  diaphoresis and fever.  HENT: Negative for trouble swallowing.   Eyes: Negative for visual disturbance.  Respiratory: Negative for cough and shortness of breath.   Cardiovascular: Positive for chest pain and leg swelling ( b/l).  Gastrointestinal: Negative for abdominal pain, blood in stool, constipation, diarrhea, nausea and vomiting.  Genitourinary: Negative for dysuria and hematuria.  Musculoskeletal: Negative for arthralgias and myalgias.  Skin: Negative for rash.  Neurological: Positive for dizziness, light-headedness and numbness ( "tingling in left arm"). Negative for syncope, facial asymmetry, speech difficulty and weakness.     Physical Exam Updated Vital Signs BP 136/73   Pulse 70   Temp 97.7 F (36.5 C)   Resp 18   Ht 5\' 9"  (1.753 m)   Wt 129.7 kg   SpO2 100%   BMI 42.23 kg/m   Physical Exam  Constitutional: She appears well-developed and well-nourished. No distress.  HENT:  Head: Normocephalic and atraumatic.  Mouth/Throat: Oropharynx is clear and moist. No oropharyngeal exudate.  Eyes: Conjunctivae and EOM are normal. Pupils are equal, round, and reactive to light. Right  eye exhibits no discharge. Left eye exhibits no discharge. No scleral icterus.  Neck: Normal range of motion and phonation normal. Neck supple. No neck rigidity. Normal range of motion present.  Cardiovascular: Normal rate, regular rhythm, normal heart sounds and intact distal pulses.   No murmur heard. Pulmonary/Chest: Effort normal and breath sounds normal. No stridor. No respiratory distress. She has no wheezes. She has no rales.  Abdominal: Soft. Bowel sounds are normal. She exhibits no distension. There is no tenderness. There is no rigidity, no rebound, no guarding and no CVA tenderness.  Musculoskeletal: Normal range of motion.  B/l lower extremity swelling. No edema. No posterior calf tenderness.   Lymphadenopathy:    She has no cervical adenopathy.  Neurological: She is alert. She is not disoriented. Coordination and gait normal. GCS eye subscore is 4. GCS verbal subscore is 5. GCS motor subscore is 6.  CN 2-12 grossly intact. Moves all extremities with ease. Strength and sensation grossly intact and equal in all extremities. Ambulatory with steady gait.   Skin: Skin is warm and dry. She is not diaphoretic.  Psychiatric: She has a normal mood and affect. Her behavior is normal.     ED Treatments / Results  Labs (all labs ordered are listed, but only abnormal results are displayed) Labs Reviewed  BASIC METABOLIC PANEL - Abnormal; Notable for the following:       Result Value   Glucose, Bld 134 (*)    All other components within normal limits  CBC  D-DIMER, QUANTITATIVE (NOT AT [facility])  Randolm Idol, ED  Randolm Idol, ED    EKG  EKG Interpretation  Date/Time:  Tuesday June 09 2016 13:29:32 EST Ventricular Rate:  69 PR Interval:    QRS Duration: 97 QT Interval:  412 QTC Calculation: 442 R Axis:   -11 Text Interpretation:  Sinus rhythm Borderline short PR interval Borderline T abnormalities, diffuse leads No significant change was found Confirmed by Venora Maples  MD,  KEVIN ([id number]) on 06/09/2016 2:27:16 PM       Radiology Dg Chest 2 View  Result Date: 06/09/2016 CLINICAL DATA:  Central chest pain. EXAM: CHEST  2 VIEW COMPARISON:  03/15/2016 FINDINGS: Heart is upper limits normal in size. No confluent airspace opacities or effusions. No acute bony abnormality. IMPRESSION: No active cardiopulmonary disease. Electronically Signed   By: Rolm Baptise M.D.   On: 06/09/2016 10:27    Procedures  Procedures (including critical care time)  Medications Ordered in ED Medications  aspirin EC tablet 325 mg (325 mg Oral Given 06/09/16 1259)  morphine 2 MG/ML injection 2 mg (2 mg Intravenous Given 06/09/16 1428)     Initial Impression / Assessment and Plan / ED Course  I have reviewed the triage vital signs and the nursing notes.  Pertinent labs & imaging results that were available during my care of the patient were reviewed by me and considered in my medical decision making (see chart for details).  Clinical Course as of Jun 09 1609  Tue Jun 09, 2016  1243 DG Chest 2 View [AM]    Clinical Course User Index [AM] Roxanna Mew, [place]    Patient presents to ED with complaint of chest pain with radiation into left arm with associated left arm tingling, lightheadedness, and dizziness. Patient is afebrile and non-toxic appearing in NAD. VSS. Heart RRR. Lungs CTABL. Abdomen soft, non-tender. No focal neuro deficits. Will check basic labs, EKG, and CXR. Given cardiac hx will consult cardiology. 325mg  ASA given. IV morphine given.   Labs are re-assuring. Initial troponin nml. EKG shows sinus rhythm with short PR intervals and borderline T abnormalities, no significant change from previous. CXR negative for PNA, pleural effusion, or PTX. Heart score 4/5. Review of records show patient has echo performed in 2015 with EF of 55-60%. In 2016 patient underwent myoview with no evidence of ischemia. In 07/2015 repeat heart cath shows 50% stenosis of prox cx to mid cx  which was tx previously with angioplasty, prox LAD to mid LAD 60% stenosed, and Ost 2nd diag to 2nd diag lesion 80% stenosed. Will consult cardiology. Pt will likely need admission for ACS r/o. Discussed patient with Dr. Venora Maples, also evaluated patient, agrees with plan.   2:10 PM: Spoke with Trish of Cardiology, greatly appreciate her time and input. Recommends medical admission and transfer to [facility]. Will see in AM. Consult to hospitalist placed.   2:57 PM: Spoke with Dr. Tana Coast of [facility], greatly appreciate her time and input. Agree to admit patient for further work up of chest pain. Patient to be transferred to [facility].  Final Clinical Impressions(s) / ED Diagnoses   Final diagnoses:  Chest pain, unspecified type    New Prescriptions New Prescriptions   No medications on file     Roxanna Mew, PA-C 06/09/16 [place], MD 06/10/16 [phone]

## 2016-06-09 NOTE — ED Notes (Signed)
RN called into room. Patient reports sudden increase in chest pain. EKG captured and given to provider.

## 2016-06-09 NOTE — ED Notes (Signed)
Pharmacy at bedside

## 2016-06-09 NOTE — H&P (Addendum)
History and Physical        Hospital Admission Note Date: 06/09/2016  Patient name: Christine Barrett Medical record number: [id number] Date of birth: 03-29-1961 Age: 55 y.o. Gender: female  PCP: Eliezer Lofts, MD   Referring physician: Leonard Schwartz, PA-C   Patient coming from: home    Chief Complaint:  Chest pain   HPI: Patient is a 55 year old female with history of GERD, hypertension, hyperlipidemia, diet-controlled diabetes mellitus, NSTEMI in 2015 presented to ED with chest pain. Patient reported that she's been having intermittent episodes of chest pain since Sunday, in the last 2 days. Since yesterday, when patient was ready to go to work, she started having midsternal chest pain, 10/10, sharp, radiating down to her left arm. She had an associated dizziness, lightheadedness and tingling in her left arm. She also felt jaw pain yesterday. She took nitroglycerin x3 sublingual prior to coming to the ER with no significant relief. She denies any fevers, chills, shortness of breath, coughing, abdominal pain.  ED work-up/course:  Temp 97.7, respiratory rate 20, BP 140/95, O2 sats 99% on room air Sodium 137, potassium 3.5, creatinine 0.68 Troponin x2 negative  EKG showed rate 69, normal sinus rhythm, T-wave flattening in the lateral leads  Review of Systems: Positives marked in 'bold' Constitutional: Denies fever, chills, diaphoresis, poor appetite and fatigue.  HEENT: Denies photophobia, eye pain, redness, hearing loss, ear pain, congestion, sore throat, rhinorrhea, sneezing, mouth sores, trouble swallowing, neck pain, neck stiffness and tinnitus.   Respiratory: Denies SOB, DOE, cough, chest tightness,  and wheezing.   Cardiovascular  please see history of present illness  Gastrointestinal: Denies nausea, vomiting, abdominal pain, diarrhea, constipation, blood in stool and abdominal  distention.  Genitourinary: Denies dysuria, urgency, frequency, hematuria, flank pain and difficulty urinating.  Musculoskeletal: Denies myalgias, back pain, joint swelling, arthralgias and gait problem.  Skin: Denies pallor, rash and wound.  Neurological: Denies dizziness, seizures, syncope, weakness, light-headedness, numbness and headaches.  Hematological: Denies adenopathy. Easy bruising, personal or family bleeding history  Psychiatric/Behavioral: Denies suicidal ideation, mood changes, confusion, nervousness, sleep disturbance and agitation  Past Medical History: Past Medical History:  Diagnosis Date  . Allergy    SEASONAL  . Anemia   . CAD S/P percutaneous coronary angioplasty 09/2013   a) Ostial AV G Cx - 2.5 mm Angiosculpt; mid LAD 40-60%; b) Myoview 07/2014: LOW RISK, small-severe fixed inferior defect c/w infarct w/o peri-infarct ischemia.  . Chronic bronchitis ([place])    "frequently; not q yr" (10/03/2013)  . Diverticulosis   . Essential hypertension    with prior Accelerated HTN  . Hiatal hernia   . Hx of non-ST elevation myocardial infarction (NSTEMI) 10/01/2013   Due to Accelerated HTN with existing CAD  . Hyperlipemia   . Migraine    "@ least once/month" (10/03/2013)  . Schatzki's ring   . Seasonal allergies   . Sinusitis     Past Surgical History:  Procedure Laterality Date  . ABDOMINAL HYSTERECTOMY  1994   "partial"  . CARDIAC CATHETERIZATION N/A 08/05/2015   Procedure: Left Heart Cath and Coronary Angiography;  Surgeon: [name], MD;  Location: [place] [facility];  Service: Cardiovascular;  Laterality: N/A;  . [place]  . COLONOSCOPY    . CORONARY ANGIOPLASTY  10/03/2013   95% ostial AV G Cx - 2.5 mm AngioSculpt Balloon PTCA; mid LAD 40-60%  . LEFT HEART CATHETERIZATION WITH CORONARY ANGIOGRAM N/A 10/02/2013   Procedure: LEFT HEART CATHETERIZATION WITH CORONARY ANGIOGRAM;  Surgeon: [name], MD;  Location: [facility];  Service:  Cardiovascular;  Laterality: N/A;  . NASAL SEPTOPLASTY W/ TURBINOPLASTY  ~ 2007  . NM MYOVIEW LTD  08/22/2014    Low risk stress nuclear study with a small, severe, fixed defect in the distal inferior wall/apex suggestive of small prior infarct; no ischemia.  LV Wall Motion: NL LV Function; NL Wall Motion  . PERCUTANEOUS CORONARY STENT INTERVENTION (PCI-S) N/A 10/03/2013   cutting balloon angioplasty only no stent.   . TRANSTHORACIC ECHOCARDIOGRAM  10/02/2013   EF 55-60%; mild LVH, no RWMA,   . TUBAL LIGATION  1989    Medications: Prior to Admission medications   Medication Sig Start Date End Date Taking? Authorizing Provider  Albuterol Sulfate 108 (90 Base) MCG/ACT AEPB Inhale 2 puffs into the lungs every 6 (six) hours as needed (shortness of breath).  12/15/15 12/14/16 Yes Historical Provider, MD  aluminum hydroxide-magnesium carbonate (GAVISCON) 95-358 MG/15ML SUSP Take 15 mLs by mouth every 6 (six) hours as needed for heartburn.   Yes Historical Provider, MD  aspirin EC 81 MG EC tablet Take 1 tablet (81 mg total) by mouth daily. 10/04/13  Yes Geradine Girt, DO  atorvastatin (LIPITOR) 40 MG tablet Take 1 tablet (40 mg total) by mouth daily. 03/03/16  Yes Amy Cletis Athens, MD  carvedilol (COREG) 25 MG tablet Take 1 tablet (25 mg total) by mouth 3 (three) times daily. Patient taking differently: Take 25 mg by mouth 2 (two) times daily with a meal.  12/24/15  Yes [name] [name], MD  cetirizine (ZYRTEC) 10 MG tablet Take 10 mg by mouth daily.   Yes Historical Provider, MD  fluticasone (FLONASE) 50 MCG/ACT nasal spray Place 2 sprays into both nostrils daily as needed for allergies or rhinitis.    Yes Historical Provider, MD  isosorbide mononitrate (IMDUR) 120 MG 24 hr tablet Take 1 tablet (120 mg total) by mouth daily. 08/05/15  Yes [name], MD  losartan-hydrochlorothiazide (HYZAAR) 100-25 MG tablet Take 1 tablet by mouth daily. 05/25/16  Yes Amy Cletis [place], MD  Multiple Vitamin (MULTIVITAMIN WITH  MINERALS) TABS tablet Take 1 tablet by mouth daily.   Yes Historical Provider, MD  Multiple Vitamins-Minerals (EMERGEN-C IMMUNE) PACK Take 1 packet by mouth daily.   Yes Historical Provider, MD  nitroGLYCERIN (NITROSTAT) 0.4 MG SL tablet Place 1 tablet (0.4 mg total) under the tongue every 5 (five) minutes as needed for chest pain. May take up to 3 tablets. If still having chest pain, call 911 immediately. 06/02/16  Yes Jerene Bears, MD  pantoprazole (PROTONIX) 40 MG tablet Take 1 tablet (40 mg total) by mouth 2 (two) times daily before a meal. 06/02/16  Yes Jerene Bears, MD  ranitidine (ZANTAC) 150 MG tablet Take 1 tablet (150 mg total) by mouth daily. 06/02/16  Yes Jerene Bears, MD    Allergies:   Allergies  Allergen Reactions  . Penicillins Rash    Pt states she has taken since intial  reaction without recurrence.  Has patient had a PCN reaction causing immediate rash, facial/tongue/throat swelling, SOB or lightheadedness with hypotension:  no Has patient had a  PCN reaction causing severe rash involving mucus membranes or skin necrosis: no Has patient had a PCN reaction that required hospitalization no Has patient had a PCN reaction occurring within the last 10 years: no If all of the above answers are "NO", then may proceed with Cephalospori    Social History:  reports that she has never smoked. She has never used smokeless tobacco. She reports that she does not drink alcohol or use drugs.  Family History: Family History  Problem Relation Age of Onset  . Adopted: Yes  . Diabetes Mother   . Breast cancer Sister   . Hypertension Sister   . Colon cancer Neg Hx   . Heart attack Neg Hx   . Stroke Neg Hx     Physical Exam: Blood pressure 140/95, pulse 69, temperature 97.7 F (36.5 C), resp. rate 20, height 5\' 9"  (1.753 m), weight 129.7 kg (286 lb), SpO2 99 %. General: Alert, awake, oriented x3, in no acute distress. HEENT: normocephalic, atraumatic, anicteric sclera, pink  conjunctiva, pupils equal and reactive to light and accomodation, oropharynx clear Neck: supple, no masses or lymphadenopathy, no goiter, no bruits  Heart: Regular rate and rhythm, without murmurs, rubs or gallops. + Chest wall tenderness  Lungs: Clear to auscultation bilaterally, no wheezing, rales or rhonchi. Abdomen: Soft, nontender, nondistended, positive bowel sounds, no masses. Extremities: No clubbing, cyanosis or edema with positive pedal pulses. Neuro: Grossly intact, no focal neurological deficits, strength 5/5 upper and lower extremities bilaterally Psych: alert and oriented x 3, normal mood and affect Skin: no rashes or lesions, warm and dry   LABS on Admission:  Basic Metabolic Panel:  Recent Labs Lab 06/09/16 1008  NA 137  K 3.5  CL 102  CO2 27  GLUCOSE 134*  BUN 13  CREATININE 0.68  CALCIUM 9.4   Liver Function Tests: No results for input(s): AST, ALT, ALKPHOS, BILITOT, PROT, ALBUMIN in the last 168 hours. No results for input(s): LIPASE, AMYLASE in the last 168 hours. No results for input(s): AMMONIA in the last 168 hours. CBC:  Recent Labs Lab 06/09/16 1008  WBC 6.0  HGB 13.2  HCT 40.5  MCV 83.9  PLT 197   Cardiac Enzymes: No results for input(s): CKTOTAL, CKMB, CKMBINDEX, TROPONINI in the last 168 hours. BNP: Invalid input(s): POCBNP CBG: No results for input(s): GLUCAP in the last 168 hours.  Radiological Exams on Admission:  No results found.  *I have personally reviewed the images above*    Assessment/Plan Principal Problem:   Chest pain: With underlying history of coronary artery disease. NSTEMI in 2015 Patient underwent cardiac cath in 2/17 which had shown 80% stenosis Ost 2ng diagonal, 60% stenosis in the proximal to mid LAD, 50% stenosis in the proximal to mid circumflex.   - Admit to stepdown due to ongoing chest pain  - Cardiology has been consulted, recommended to [facility] for further workup, may need cardiac  catheterization -  Lipid panel showed LDL of 125 continue statin, aspirin, beta blocker,  Imdur - Follow d-dimer   Active Problems:   Hyperlipidemia with target LDL less than 70 - Continue statin     GERD (gastroesophageal reflux disease) - Placed on PPI,  H2 blocker     Essential hypertension - Continue Coreg, losartan, HCTZ, Imdur   DVT prophylaxis:  heparin subcutaneous   CODE STATUS:  full CODE STATUS   Consults called:  cardiology   Family Communication: Admission, patients condition and plan of care including tests being  ordered have been discussed with the patient and husband  who indicates understanding and agree with the plan and Code Status  Admission status:   Disposition plan: Further plan will depend as patient's clinical course evolves and further radiologic and laboratory data become available.  At the time of admission, it appears that the appropriate admission status for this patient is observation . This is judged to be reasonable and necessary in order to provide the required intensity of service to ensure the patient's safety given the presenting symptoms, physical exam findings, and initial radiographic and laboratory data in the context of their chronic comorbidities.  The following factors support the admission status of inpatient:     Time Spent on Admission: 7mins   Chelbi Herber M.D. [facility] 06/09/2016, 2:43 PM Pager: [phone]  If 7PM-7AM, please contact night-coverage [email or web address] Password [email or web address]

## 2016-06-09 NOTE — ED Notes (Signed)
Family requesting estimated time of arrival or [facility]. This RN called [facility] and they estimated patient to be picked up around 7:30pm. Patient and family made aware.

## 2016-06-09 NOTE — ED Notes (Signed)
Patient denies pain and is resting comfortably.  

## 2016-06-09 NOTE — Telephone Encounter (Signed)
Patient Name: Christine Barrett DOB: [birth year] Initial Comment Caller states, [name]- patient -she is having chest pains again. She been in and out of the ER off and on. She has been in pain since Sunday. Acid reflux and heart condition. Pain radiates down her arm. [facility] from [facility]. Verified Nurse Assessment Nurse: Vallery Sa, RN, Cathy Date/Time (Eastern Time): 06/09/2016 9:28:41 AM Confirm and document reason for call. If symptomatic, describe symptoms. ---Caller states she developed chest pain that goes into her left arm again about 2 days ago that she rates as an 8 on the 1 to 10 scale. No severe breathing difficulty. Alert and responsive. Does the patient have any new or worsening symptoms? ---Yes Will a triage be completed? ---Yes Related visit to physician within the last 2 weeks? ---Yes Does the PT have any chronic conditions? (i.e. diabetes, asthma, etc.) ---Yes List chronic conditions. ---Heart Disease, Acid Reflux, high Blood Pressure and Cholesterol Is the patient pregnant or possibly pregnant? (Ask all females between the ages of 2-55) ---No Is this a behavioral health or substance abuse call? ---No Guidelines Guideline Title Affirmed Question Affirmed Notes Chest Pain [1] Chest pain lasts > 5 minutes AND [2] history of heart disease (i.e., heart attack, bypass surgery, angina, angioplasty, CHF; not just a heart murmur) Final Disposition User Call EMS 911 Now [place], RN, [employer] declined the Call 911 disposition. Reinforced the Call 911 disposition. She plans to have her pastor who is on the line take her to the ER now for evaluation. She requests to have a referral to [facility] as she feels the local ERs are not helping her. She is aware that we will send a message to Dr. [name] with her referral request. Referrals [facility] - ED Disagree/Comply: Disagree Disagree/Comply Reason: Disagree with instructions

## 2016-06-09 NOTE — ED Notes (Signed)
Patient transported to X-ray 

## 2016-06-09 NOTE — ED Triage Notes (Addendum)
Patient states that having central chest pain since Sunday. Patient states that was intermittent Sunday then been constant since yesterday. Patient states pain is no better after taking 3 Nitro this morning.  Patient is heart  patient

## 2016-06-10 ENCOUNTER — Telehealth: Payer: Self-pay | Admitting: Pulmonary Disease

## 2016-06-10 ENCOUNTER — Encounter: Payer: Self-pay | Admitting: Pulmonary Disease

## 2016-06-10 DIAGNOSIS — R079 Chest pain, unspecified: Secondary | ICD-10-CM | POA: Diagnosis not present

## 2016-06-10 DIAGNOSIS — G4733 Obstructive sleep apnea (adult) (pediatric): Secondary | ICD-10-CM

## 2016-06-10 DIAGNOSIS — I2510 Atherosclerotic heart disease of native coronary artery without angina pectoris: Secondary | ICD-10-CM | POA: Diagnosis not present

## 2016-06-10 DIAGNOSIS — Z9861 Coronary angioplasty status: Secondary | ICD-10-CM | POA: Diagnosis not present

## 2016-06-10 HISTORY — DX: Obstructive sleep apnea (adult) (pediatric): G47.33

## 2016-06-10 LAB — CBC
HEMATOCRIT: 36.3 % (ref 36.0–46.0)
Hemoglobin: 11.8 g/dL — ABNORMAL LOW (ref 12.0–15.0)
MCH: 27.1 pg (ref 26.0–34.0)
MCHC: 32.5 g/dL (ref 30.0–36.0)
MCV: 83.4 fL (ref 78.0–100.0)
PLATELETS: 240 10*3/uL (ref 150–400)
RBC: 4.35 MIL/uL (ref 3.87–5.11)
RDW: 13.9 % (ref 11.5–15.5)
WBC: 5.8 10*3/uL (ref 4.0–10.5)

## 2016-06-10 LAB — TROPONIN I: Troponin I: <0.03 ng/mL (ref <0.03)

## 2016-06-10 LAB — BASIC METABOLIC PANEL
Anion gap: 9 (ref 5–15)
BUN: 12 mg/dL (ref 6–20)
CALCIUM: 9.1 mg/dL (ref 8.9–10.3)
CO2: 24 mmol/L (ref 22–32)
Chloride: 105 mmol/L (ref 101–111)
Creatinine, Ser: 0.68 mg/dL (ref 0.44–1.00)
GFR calc Af Amer: >60 mL/min (ref >60)
GLUCOSE: 125 mg/dL — AB (ref 65–99)
POTASSIUM: 3.6 mmol/L (ref 3.5–5.1)
Sodium: 138 mmol/L (ref 135–145)

## 2016-06-10 MED ORDER — KETOROLAC TROMETHAMINE 30 MG/ML IJ SOLN
30.0000 mg | Freq: Once | INTRAMUSCULAR | Status: AC
  Administered 2016-06-10: 30 mg via INTRAVENOUS
  Filled 2016-06-10: qty 1

## 2016-06-10 MED ORDER — KETOROLAC TROMETHAMINE 15 MG/ML IJ SOLN
30.0000 mg | Freq: Four times a day (QID) | INTRAMUSCULAR | Status: AC
  Administered 2016-06-10 (×2): 30 mg via INTRAVENOUS
  Filled 2016-06-10 (×2): qty 2

## 2016-06-10 NOTE — Progress Notes (Signed)
PROGRESS NOTE    Christine Barrett  [id number] DOB: 08-06-1960 DOA: 06/09/2016 PCP: Eliezer Lofts, MD   Outpatient Specialists:     Brief Narrative:  Patient is a 55 year old female with history of GERD, hypertension, hyperlipidemia, diet-controlled diabetes mellitus, NSTEMI in 2015 presented to ED with chest pain. Patient reported that she's been having intermittent episodes of chest pain since Sunday, in the last 2 days. Since yesterday, when patient was ready to go to work, she started having midsternal chest pain, 10/10, sharp, radiating down to her left arm. She had an associated dizziness, lightheadedness and tingling in her left arm. She also felt jaw pain yesterday. She took nitroglycerin x3 sublingual prior to coming to the ER with no significant relief. She denies any fevers, chills, shortness of breath, coughing, abdominal pain.   Assessment & Plan:   Principal Problem:   Chest pain Active Problems:   Hyperlipidemia with target LDL less than 70   CAD S/P percutaneous coronary angioplasty - Ostial AVG Cx - Scoring balloon PTCA   GERD (gastroesophageal reflux disease)   Essential hypertension    Chest pain:  -seen by cards- ordered toradol which is helping pain -CE negative LDL 125- continue statin    Hyperlipidemia with target LDL less than 70 - Continue statin     GERD (gastroesophageal reflux disease) - Placed on PPI,  H2 blocker     Essential hypertension - Continue Coreg, losartan, HCTZ, Imdur    DVT prophylaxis:  SQ Heparin  Code Status: Full Code  Family Communication: patient  Disposition Plan:  Home in AM if ok with cards   Consultants:   cards     Subjective: Pain in chest improved with toradol C/o being tired  Objective: Vitals:   06/10/16 0003 06/10/16 0434 06/10/16 0622 06/10/16 0812  BP: 129/70 (!) 138/59  123/70  Pulse: 75 70  69  Resp: 16 12    Temp: 97.7 F (36.5 C)  97.9 F (36.6 C) 98 F (36.7 C)  TempSrc: Axillary  Oral  Oral  SpO2: 96% 97%  98%  Weight:   125.6 kg (276 lb 14.4 oz)   Height:        Intake/Output Summary (Last 24 hours) at 06/10/16 1201 Last data filed at 06/10/16 0600  Gross per 24 hour  Intake              375 ml  Output                0 ml  Net              37 5 ml   Filed Weights   06/09/16 0954 06/09/16 2037 06/10/16 0622  Weight: 129.7 kg (286 lb) 125.6 kg (276 lb 14.4 oz) 125.6 kg (276 lb 14.4 oz)    Examination:  General exam: Appears calm and comfortable  Respiratory system: Clear to auscultation. Respiratory effort normal. Cardiovascular system: S1 & S2 heard, RRR. No JVD, murmurs, rubs, gallops or clicks. No pedal edema. Gastrointestinal system: Abdomen is nondistended, soft and nontender. No organomegaly or masses felt. Normal bowel sounds heard.    Data Reviewed: I have personally reviewed following labs and imaging studies  CBC:  Recent Labs Lab 06/09/16 1008 06/09/16 2038 06/10/16 0825  WBC 6.0 7.1 5.8  HGB 13.2 12.4 11.8*  HCT 40.5 39.1 36.3  MCV 83.9 83.7 83.4  PLT 197 262 [id number]   Basic Metabolic Panel:  Recent Labs Lab 06/09/16 1008 06/09/16 2038 06/10/16 0825  NA 137  --  138  K 3.5  --  3.6  CL 102  --  105  CO2 27  --  24  GLUCOSE 134*  --  125*  BUN 13  --  12  CREATININE 0.68 0.74 0.68  CALCIUM 9.4  --  9.1   GFR: Estimated Creatinine Clearance: 114.2 mL/min (by C-G formula based on SCr of 0.68 mg/dL). Liver Function Tests: No results for input(s): AST, ALT, ALKPHOS, BILITOT, PROT, ALBUMIN in the last 168 hours. No results for input(s): LIPASE, AMYLASE in the last 168 hours. No results for input(s): AMMONIA in the last 168 hours. Coagulation Profile: No results for input(s): INR, PROTIME in the last 168 hours. Cardiac Enzymes:  Recent Labs Lab 06/09/16 2038 06/10/16 0825  TROPONINI <0.03 <0.03   BNP (last 3 results) No results for input(s): PROBNP in the last 8760 hours. HbA1C: No results for input(s): HGBA1C in the  last 72 hours. CBG: No results for input(s): GLUCAP in the last 168 hours. Lipid Profile: No results for input(s): CHOL, HDL, LDLCALC, TRIG, CHOLHDL, LDLDIRECT in the last 72 hours. Thyroid Function Tests: No results for input(s): TSH, T4TOTAL, FREET4, T3FREE, THYROIDAB in the last 72 hours. Anemia Panel: No results for input(s): VITAMINB12, FOLATE, FERRITIN, TIBC, IRON, RETICCTPCT in the last 72 hours. Urine analysis:    Component Value Date/Time   COLORURINE AMBER (A) 10/04/2014 1718   APPEARANCEUR TURBID (A) 10/04/2014 1718   LABSPEC >1.046 (H) 10/04/2014 1718   PHURINE 5.5 10/04/2014 1718   GLUCOSEU NEGATIVE 10/04/2014 1718   GLUCOSEU NEGATIVE 03/10/2007 1004   HGBUR MODERATE (A) 10/04/2014 1718   BILIRUBINUR SMALL (A) 10/04/2014 1718   KETONESUR NEGATIVE 10/04/2014 1718   PROTEINUR 30 (A) 10/04/2014 1718   UROBILINOGEN 1.0 10/04/2014 1718   NITRITE NEGATIVE 10/04/2014 1718   LEUKOCYTESUR NEGATIVE 10/04/2014 1718     ) Recent Results (from the past 240 hour(s))  MRSA PCR Screening     Status: None   Collection Time: 06/09/16  8:53 PM  Result Value Ref Range Status   MRSA by PCR NEGATIVE NEGATIVE Final    Comment:        The GeneXpert MRSA Assay (FDA approved for NASAL specimens only), is one component of a comprehensive MRSA colonization surveillance program. It is not intended to diagnose MRSA infection nor to guide or monitor treatment for MRSA infections.       Anti-infectives    None       Radiology Studies: Dg Chest 2 View  Result Date: 06/09/2016 CLINICAL DATA:  Central chest pain. EXAM: CHEST  2 VIEW COMPARISON:  03/15/2016 FINDINGS: Heart is upper limits normal in size. No confluent airspace opacities or effusions. No acute bony abnormality. IMPRESSION: No active cardiopulmonary disease. Electronically Signed   By: Rolm Baptise M.D.   On: 06/09/2016 10:27        Scheduled Meds: . aspirin EC  81 mg Oral Daily  . atorvastatin  40 mg Oral  q1800  . carvedilol  25 mg Oral BID WC  . famotidine  20 mg Oral Daily  . heparin  5,000 Units Subcutaneous Q8H  . losartan  100 mg Oral Daily   And  . hydrochlorothiazide  25 mg Oral Daily  . isosorbide mononitrate  120 mg Oral Daily  . ketorolac  30 mg Intravenous Q6H  . loratadine  10 mg Oral Daily  . multivitamin with minerals  1 tablet Oral Daily  . pantoprazole  40 mg Oral BID AC   Continuous  Infusions:   LOS: 0 days    Time spent: 25 min    [place], DO [facility] Pager [phone]  If 7PM-7AM, please contact night-coverage [email or web address] Password [email or web address] 06/10/2016, 12:01 PM

## 2016-06-10 NOTE — Progress Notes (Signed)
   I returned to see the patient after she received a dose of Toradol. She was definitely improving. I will order 2 more doses for today.  Daryel November, MD

## 2016-06-10 NOTE — Progress Notes (Addendum)
CARDIOLOGY CONSULT NOTE  Patient ID: Christine Barrett, MRN: [id number], DOB/AGE: 1961-01-07 55 y.o. Admit date: 06/09/2016   Date of Consult: 06/10/2016 Primary Physician: Eliezer Lofts, MD Primary Cardiologist: Dr. Ellyn Hack in the past. Changing to Dr Julianne Handler  Chief Complaint: Chest pain   HPI:    The patient is admitted with chest pain. Her chest pain history is complex. She underwent PCI to the circumflex in 2015. She underwent nuclear testing in February, 2016. This study was low risk with a small inferior defect. There was no LAD ischemia. With recurrent chest pain, she underwent repeat catheterization on August 05, 2015. At that time there was an 80% diagonal lesion that was too small for PCI. There was question as to whether this could be the culprit. There was also 60% LAD disease that had been stable from the prior study. The circumflex lesion that had been treated in 2015 was 50% stenosed. FFR was not done because of radial/brachial spasm. There was mention of increasing her Imdur. She was seen as an outpatient on one occasion after this catheterization.  The patient decided to get further cardiology evaluation at [facility]. She thinks this was done in April, 2017. She says that the cardiologist there reviewed her overall situation and felt that no further testing was appropriate at that time. Her carvedilol dose was increased. She felt that this decreased her blood pressure and eventually she returned to her prior dose of carvedilol.  Over time she has had recurrent symptoms including chest discomfort. Her history is complicated by the fact that she does have GI disease. There is a history of reflux that has been treated. She also has a Schatzki ring that was dilated in September, 2017. She saw Dr.Pyrtle of gastroenterology very recently. His note is very complete. He outlines all of her symptoms. However he felt that her symptoms at that time were not GI in origin. He encouraged  early cardiology follow-up. The patient mentioned that she would like other cardiology opinions from within our group and therefore requested new cardiology follow-up with Dr.McAlhaney. It is my understanding that this change was approved and that the patient was to be seen for cardiology outpatient follow-up. However in the meantime she presented to the emergency room with chest discomfort.  With admission yesterday she mentions chest discomfort, arm radiation, and possibly some jaw discomfort. She thinks there may have been slight improvement with nitroglycerin. As I came into her room this morning she said she was having chest discomfort. This seemed to improve as I was speaking with her. She had definite tenderness to palpation of the anterior chest during my discussion with her this morning.  Past Medical History:  Diagnosis Date  . Allergy    SEASONAL  . Anemia   . CAD S/P percutaneous coronary angioplasty 09/2013   a) Ostial AV G Cx - 2.5 mm Angiosculpt; mid LAD 40-60%; b) Myoview 07/2014: LOW RISK, small-severe fixed inferior defect c/w infarct w/o peri-infarct ischemia.  . Chronic bronchitis ([place])    "frequently; not q yr" (10/03/2013)  . Diverticulosis   . Essential hypertension    with prior Accelerated HTN  . Hiatal hernia   . Hx of non-ST elevation myocardial infarction (NSTEMI) 10/01/2013   Due to Accelerated HTN with existing CAD  . Hyperlipemia   . Migraine    "@ least once/month" (10/03/2013)  . Schatzki's ring   . Seasonal allergies   . Sinusitis       Most Recent Cardiac  Studies:    Surgical History:  Past Surgical History:  Procedure Laterality Date  . ABDOMINAL HYSTERECTOMY  1994   "partial"  . CARDIAC CATHETERIZATION N/A 08/05/2015   Procedure: Left Heart Cath and Coronary Angiography;  Surgeon: [name], MD;  Location: [place] CV LAB;  Service: Cardiovascular;  Laterality: N/A;  . CESAREAN SECTION  1989  . COLONOSCOPY    . CORONARY ANGIOPLASTY  10/03/2013    95% ostial AV G Cx - 2.5 mm AngioSculpt Balloon PTCA; mid LAD 40-60%  . LEFT HEART CATHETERIZATION WITH CORONARY ANGIOGRAM N/A 10/02/2013   Procedure: LEFT HEART CATHETERIZATION WITH CORONARY ANGIOGRAM;  Surgeon: [name], MD;  Location: [facility] CATH LAB;  Service: Cardiovascular;  Laterality: N/A;  . NASAL SEPTOPLASTY W/ TURBINOPLASTY  ~ 2007  . NM MYOVIEW LTD  08/22/2014    Low risk stress nuclear study with a small, severe, fixed defect in the distal inferior wall/apex suggestive of small prior infarct; no ischemia.  LV Wall Motion: NL LV Function; NL Wall Motion  . PERCUTANEOUS CORONARY STENT INTERVENTION (PCI-S) N/A 10/03/2013   cutting balloon angioplasty only no stent.   . TRANSTHORACIC ECHOCARDIOGRAM  10/02/2013   EF 55-60%; mild LVH, no RWMA,   . TUBAL LIGATION  1989     Home Meds: Prior to Admission medications   Medication Sig Start Date End Date Taking? Authorizing Provider  Albuterol Sulfate 108 (90 Base) MCG/ACT AEPB Inhale 2 puffs into the lungs every 6 (six) hours as needed (shortness of breath).  12/15/15 12/14/16 Yes Historical Provider, MD  aluminum hydroxide-magnesium carbonate (GAVISCON) 95-358 MG/15ML SUSP Take 15 mLs by mouth every 6 (six) hours as needed for heartburn.   Yes Historical Provider, MD  aspirin EC 81 MG EC tablet Take 1 tablet (81 mg total) by mouth daily. 10/04/13  Yes Geradine Girt, DO  atorvastatin (LIPITOR) 40 MG tablet Take 1 tablet (40 mg total) by mouth daily. 03/03/16  Yes Amy Cletis Athens, MD  carvedilol (COREG) 25 MG tablet Take 1 tablet (25 mg total) by mouth 3 (three) times daily. Patient taking differently: Take 25 mg by mouth 2 (two) times daily with a meal.  12/24/15  Yes [name] [name], MD  cetirizine (ZYRTEC) 10 MG tablet Take 10 mg by mouth daily.   Yes Historical Provider, MD  fluticasone (FLONASE) 50 MCG/ACT nasal spray Place 2 sprays into both nostrils daily as needed for allergies or rhinitis.    Yes Historical Provider, MD  isosorbide  mononitrate (IMDUR) 120 MG 24 hr tablet Take 1 tablet (120 mg total) by mouth daily. 08/05/15  Yes [name], MD  losartan-hydrochlorothiazide (HYZAAR) 100-25 MG tablet Take 1 tablet by mouth daily. 05/25/16  Yes Amy Cletis Athens, MD  Multiple Vitamin (MULTIVITAMIN WITH MINERALS) TABS tablet Take 1 tablet by mouth daily.   Yes Historical Provider, MD  Multiple Vitamins-Minerals (EMERGEN-C IMMUNE) PACK Take 1 packet by mouth daily.   Yes Historical Provider, MD  nitroGLYCERIN (NITROSTAT) 0.4 MG SL tablet Place 1 tablet (0.4 mg total) under the tongue every 5 (five) minutes as needed for chest pain. May take up to 3 tablets. If still having chest pain, call 911 immediately. 06/02/16  Yes Jerene Bears, MD  pantoprazole (PROTONIX) 40 MG tablet Take 1 tablet (40 mg total) by mouth 2 (two) times daily before a meal. 06/02/16  Yes Jerene Bears, MD  ranitidine (ZANTAC) 150 MG tablet Take 1 tablet (150 mg total) by mouth daily. 06/02/16  Yes Jerene Bears, MD    Inpatient Medications:  . aspirin EC  81 mg Oral Daily  . atorvastatin  40 mg Oral q1800  . carvedilol  25 mg Oral BID WC  . famotidine  20 mg Oral Daily  . heparin  5,000 Units Subcutaneous Q8H  . losartan  100 mg Oral Daily   And  . hydrochlorothiazide  25 mg Oral Daily  . isosorbide mononitrate  120 mg Oral Daily  . ketorolac  30 mg Intravenous Once  . loratadine  10 mg Oral Daily  . multivitamin with minerals  1 tablet Oral Daily  . pantoprazole  40 mg Oral BID AC    Allergies:  Allergies  Allergen Reactions  . Penicillins Rash    Pt states she has taken since intial  reaction without recurrence.  Has patient had a PCN reaction causing immediate rash, facial/tongue/throat swelling, SOB or lightheadedness with hypotension:  no Has patient had a PCN reaction causing severe rash involving mucus membranes or skin necrosis: no Has patient had a PCN reaction that required hospitalization no Has patient had a PCN reaction occurring within  the last 10 years: no If all of the above answers are "NO", then may proceed with Cephalospori    Social History   Social History  . Marital status: Married    Spouse name: Herbie Baltimore  . Number of children: 2  . Years of education: N/A   Occupational History  . Research scientist (life sciences)    Social History Main Topics  . Smoking status: Never Smoker  . Smokeless tobacco: Never Used  . Alcohol use No  . Drug use: No  . Sexual activity: Yes    Birth control/ protection: Surgical   Other Topics Concern  . Not on file   Social History Narrative   She is a married mother of 2, grandmother 2. Her current marriage is than a pack years.    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 She never smoked and does not drink All. She does do time and balancing exercises but does not do routine of exercise.   She herself is adopted.     Family History  Problem Relation Age of Onset  . Adopted: Yes  . Diabetes Mother   . Breast cancer Sister   . Hypertension Sister   . Colon cancer Neg Hx   . Heart attack Neg Hx   . Stroke Neg Hx      Review of Systems: The patient denies fever, chills, headache, sweats, rash, change in vision, change in hearing, nausea vomiting, urinary symptoms. She is having discomfort with palpation of the chest and deep inspiration. Labs:  Recent Labs  06/09/16 2038  TROPONINI <0.03   Lab Results  Component Value Date   WBC 7.1 06/09/2016   HGB 12.4 06/09/2016   HCT 39.1 06/09/2016   MCV 83.7 06/09/2016   PLT 262 06/09/2016    Recent Labs Lab 06/09/16 1008 06/09/16 2038  NA 137  --   K 3.5  --   CL 102  --   CO2 27  --   BUN 13  --   CREATININE 0.68 0.74  CALCIUM 9.4  --   GLUCOSE 134*  --    Lab Results  Component Value Date   CHOL 198 05/18/2016   HDL 40.00 05/18/2016   LDLCALC 125 (H) 05/18/2016   TRIG 163.0 (H) 05/18/2016  Lab Results  Component Value Date   DDIMER 0.33  03/15/2016    Radiology/Studies:  Dg Chest 2 View  Result Date: 06/09/2016 CLINICAL DATA:  Central chest pain. EXAM: CHEST  2 VIEW COMPARISON:  03/15/2016 FINDINGS: Heart is upper limits normal in size. No confluent airspace opacities or effusions. No acute bony abnormality. IMPRESSION: No active cardiopulmonary disease. Electronically Signed   By: Rolm Baptise M.D.   On: 06/09/2016 10:27    EKG: No significant new change  Physical Exam: Blood pressure (!) 138/59, pulse 70, temperature 97.9 F (36.6 C), resp. rate 12, height 5\' 9"  (1.753 m), weight 276 lb 14.4 oz (125.6 kg), SpO2 97 %. The patient is oriented to person time and place. She was having some chest discomfort as I entered the room. This appeared to improve as we were talking. Head is atraumatic. There is no jugular venous distention. Lungs are clear. Respiratory effort is not labored. Cardiac exam reveals an S1 and S2. There is definite significant pain to palpation of the sternum. Abdomen is soft. There is no peripheral edema. There are no musculoskeletal deformities. There are no skin rashes.  Assessment and Plan:     Chest pain     There is no EKG change. Troponins are normal so far.     The patient has definite pain to palpation of the sternum today. Her symptoms as I am seeing her seemed to be related to musculoskeletal chest wall pain. For this issue I have ordered Toradol. I will see how she responds to this as the first form of treatment today. She does have multiple other complicating factors. She definitely has coronary disease as outlined carefully above. At her last cath in February, 2017, it is known that she has a diagonal lesion that was too small to treat. There is moderate LAD disease. She tells me she was seen in consultation after this time at [facility] with plans for medical therapy. It is possible that her LAD disease could have progressed. It is also possible that her previously treated circumflex disease  could have progressed. Consideration can be given to repeat catheterization. In February, 2017, FFR was not done because of radial/brachial spasm. It would seem that we should be prepared to do FFR if catheterization is done this admission. I do not know if this would change the approach to her catheterization relative to using her radial or using a femoral approach because of her prior problem with spasm. It might also be helpful to do nuclear stress testing. This might tell us about potential ischemia. However she prefers not to do exercise testing today because of her chest wall pain. In addition, I'm not convinced that this will be the best approach at this time. The patient mentioned to me this morning that she might also want further opinions at [facility]. For now the plan will be to offer her the most complete care that we can offer her here. Her care will be changed to Dr. Angelena Form as I outlined above. Active Problems:    Hyperlipidemia with target LDL less than 70    CAD S/P percutaneous coronary angioplasty - Ostial AVG Cx - Scoring balloon PTCA      I have outlined the approach to her coronary disease above.    GERD (gastroesophageal reflux disease)      The patient has definite GI disease. I outlined the description of her GI evaluation done recently in my explanations above.    Essential hypertension  I  have ordered IV Toradol. This morning. She will be reassessed to see if this helps with her chest wall tenderness this morning. Based on the response to this medication, we will decide about the next step in her cardiac evaluation.   Signed, Daryel November, MD, [facility] 06/10/2016, 7:25 AM

## 2016-06-10 NOTE — Telephone Encounter (Signed)
HST 05/816 >> AHI 16, SaO2 low 82%   Will have my nurse inform pt that sleep study shows moderate sleep apnea.  Options are 1) CPAP now, 2) ROV first.  If She is agreeable to CPAP, then please send order for auto CPAP range 5 to 15 cm H2O with heated humidity and mask of choice.  Have download sent 1 month after starting CPAP and set up ROV 2 months after starting CPAP.  ROV can be with me or NP.

## 2016-06-11 ENCOUNTER — Other Ambulatory Visit: Payer: Self-pay | Admitting: *Deleted

## 2016-06-11 DIAGNOSIS — K219 Gastro-esophageal reflux disease without esophagitis: Secondary | ICD-10-CM

## 2016-06-11 DIAGNOSIS — I10 Essential (primary) hypertension: Secondary | ICD-10-CM | POA: Diagnosis not present

## 2016-06-11 DIAGNOSIS — E785 Hyperlipidemia, unspecified: Secondary | ICD-10-CM

## 2016-06-11 DIAGNOSIS — R0789 Other chest pain: Secondary | ICD-10-CM | POA: Diagnosis not present

## 2016-06-11 DIAGNOSIS — R0683 Snoring: Secondary | ICD-10-CM

## 2016-06-11 MED ORDER — TRAMADOL HCL 50 MG PO TABS: 50.0000 mg | ORAL_TABLET | Freq: Four times a day (QID) | ORAL | 0 refills | Status: DC | PRN

## 2016-06-11 NOTE — Progress Notes (Signed)
    Subjective:  Patient is feeling better. Her chest pain is resolved. No resting shortness of breath.  Objective:  Vital Signs in the last 24 hours: Temp:  [97.5 F (36.4 C)-98 F (36.7 C)] 97.9 F (36.6 C) (12/14 0401) Pulse Rate:  [70-77] 77 (12/14 0401) Resp:  [11-17] 15 (12/14 0401) BP: (101-113)/(45-62) 110/58 (12/14 0401) SpO2:  [97 %-100 %] 97 % (12/14 0401) Weight:  [278 lb (126.1 kg)] 278 lb (126.1 kg) (12/14 0401)  Intake/Output from previous day: 12/13 0701 - 12/14 0700 In: 125 [P.O.:125] Out: -   Physical Exam: Pt is alert and oriented, NAD HEENT: normal Neck: JVP - normal, carotids 2+= without bruits Lungs: CTA bilaterally CV: RRR without murmur or gallop Abd: soft, NT, Positive BS, no hepatomegaly Ext: no C/C/E, distal pulses intact and equal Skin: warm/dry no rash   Lab Results:  Recent Labs  06/09/16 2038 06/10/16 0825  WBC 7.1 5.8  HGB 12.4 11.8*  PLT 262 240    Recent Labs  06/09/16 1008 06/09/16 2038 06/10/16 0825  NA 137  --  138  K 3.5  --  3.6  CL 102  --  105  CO2 27  --  24  GLUCOSE 134*  --  125*  BUN 13  --  12  CREATININE 0.68 0.74 0.68    Recent Labs  06/09/16 2038 06/10/16 0825  TROPONINI <0.03 <0.03    Cardiac Studies: Troponin negative 3  Tele: Normal sinus rhythm  Assessment/Plan:  1. Atypical chest pain: The patient does not have exertional angina. She has had recurrent resting pain with multiple emergency room evaluations. She underwent cardiac catheterization in February 2017. I have reviewed her cardiac cath films. She had moderate LAD stenosis take clearly is not causing resting coronary obstruction/ischemia. She does have some small vessel disease and recommendations for medical therapy are appropriate. Her current presentation with chest pain appears musculoskeletal and has responded well to Toradol. Would be reasonable to use nonsteroidal anti-inflammatories as needed.  2. Coronary artery disease: As  outlined above. Continue medical management.  Disposition: The patient has ruled out for MI. Her atypical chest pain has a very low likelihood of a cardiac etiology. I have reviewed her medications and would continue her current treatment. She is under a lot of stress at home and this is probably exacerbating her chest pain. She works full-time, has been in school, and watches grandchildren. We discussed lifestyle modification, including the importance of diet and exercise. Considering her moderate coronary artery disease, it would be reasonable to perform an exercise stress Myoview scan. I recommended that this be done as an outpatient. The patient prefers to seek a second opinion regarding her coronary artery disease either at [facility] or [facility]. I advised her to call us at any time and we would be happy to see her and perform stress testing if she desires. From a cardiac perspective she is stable for hospital discharge.  Time spent face-to-face greater than 30 minutes.   Sherren Mocha, M.D. 06/11/2016, 9:58 AM

## 2016-06-12 NOTE — Discharge Summary (Signed)
Physician Discharge Summary  Christine Barrett [id number] DOB: 07-15-1960 DOA: 06/09/2016  PCP: Eliezer Lofts, MD  Admit date: 06/09/2016 Discharge date: 06/11/2016  Admitted From: Home.  Disposition: Home.   Recommendations for Outpatient Follow-up:  1. Follow up with PCP in 1-2 weeks 2. Please obtain BMP/CBC in one week 3. Please follow up on the following pending results:    Discharge Condition:stable.  CODE STATUS:full code.  Diet recommendation: Heart Healthy Brief/Interim Summary: Patient is a 55 year old female with history of GERD, hypertension, hyperlipidemia, diet-controlled diabetes mellitus, NSTEMI in 2015 presented to ED with chest pain. Patient reported that she's been having intermittent episodes of chest pain since Sunday,. She was admitted for evaluation of ACS.   Discharge Diagnoses:  Principal Problem:   Chest pain Active Problems:   Hyperlipidemia with target LDL less than 70   CAD S/P percutaneous coronary angioplasty - Ostial AVG Cx - Scoring balloon PTCA   GERD (gastroesophageal reflux disease)   Essential hypertension  Chest pain: With underlying history of coronary artery disease. NSTEMI in 2015 Patient underwent cardiac cath in 2/17 which had shown 80% stenosis Ost 2ng diagonal, 60% stenosis in the proximal to mid LAD, 50% stenosis in the proximal to mid circumflex.   - Admittd to stepdown due to ongoing chest pain  - currently chest pain resolved. She is ruled out for MI.  - Cardiology has been consulted, recommended to outpatient follow up with a stress test .  -  Lipid panel showed LDL of 125 continue statin, aspirin, beta blocker,  Imdur - pt would like a second opinion at [facility] or [place] .        Hyperlipidemia with target LDL less than 70 - Continue statin     GERD (gastroesophageal reflux disease) - Placed on PPI,  H2 blocker     Essential hypertension - Continue Coreg, losartan, HCTZ, Imdur   Discharge Instructions  Discharge  Instructions    Diet - low sodium heart healthy    Complete by:  As directed    Discharge instructions    Complete by:  As directed    Recommend outpatient stress test at cardiology office. Follow up with PCP in one week.     Allergies as of 06/11/2016      Reactions   Penicillins Rash   Pt states she has taken since intial  reaction without recurrence.  Has patient had a PCN reaction causing immediate rash, facial/tongue/throat swelling, SOB or lightheadedness with hypotension:  no Has patient had a PCN reaction causing severe rash involving mucus membranes or skin necrosis: no Has patient had a PCN reaction that required hospitalization no Has patient had a PCN reaction occurring within the last 10 years: no If all of the above answers are "NO", then may proceed with Cephalospori      Medication List    TAKE these medications   Albuterol Sulfate 108 (90 Base) MCG/ACT Aepb Inhale 2 puffs into the lungs every 6 (six) hours as needed (shortness of breath).   aspirin 81 MG EC tablet Take 1 tablet (81 mg total) by mouth daily.   atorvastatin 40 MG tablet Commonly known as:  LIPITOR Take 1 tablet (40 mg total) by mouth daily.   carvedilol 25 MG tablet Commonly known as:  COREG Take 1 tablet (25 mg total) by mouth 3 (three) times daily. What changed:  when to take this   cetirizine 10 MG tablet Commonly known as:  ZYRTEC Take 10 mg by mouth daily.  EMERGEN-C IMMUNE Pack Take 1 packet by mouth daily.   fluticasone 50 MCG/ACT nasal spray Commonly known as:  FLONASE Place 2 sprays into both nostrils daily as needed for allergies or rhinitis.   GAVISCON 95-358 MG/15ML Susp Generic drug:  aluminum hydroxide-magnesium carbonate Take 15 mLs by mouth every 6 (six) hours as needed for heartburn.   isosorbide mononitrate 120 MG 24 hr tablet Commonly known as:  IMDUR Take 1 tablet (120 mg total) by mouth daily.   losartan-hydrochlorothiazide 100-25 MG tablet Commonly known  as:  HYZAAR Take 1 tablet by mouth daily.   multivitamin with minerals Tabs tablet Take 1 tablet by mouth daily.   nitroGLYCERIN 0.4 MG SL tablet Commonly known as:  NITROSTAT Place 1 tablet (0.4 mg total) under the tongue every 5 (five) minutes as needed for chest pain. May take up to 3 tablets. If still having chest pain, call 911 immediately.   pantoprazole 40 MG tablet Commonly known as:  PROTONIX Take 1 tablet (40 mg total) by mouth 2 (two) times daily before a meal.   ranitidine 150 MG tablet Commonly known as:  ZANTAC Take 1 tablet (150 mg total) by mouth daily.   traMADol 50 MG tablet Commonly known as:  ULTRAM Take 1 tablet (50 mg total) by mouth every 6 (six) hours as needed.      Follow-up Information    Eliezer Lofts, MD. Schedule an appointment as soon as possible for a visit in 1 week(s).   Specialty:  Family Medicine Contact information: [place] [address] [phone]          Allergies  Allergen Reactions  . Penicillins Rash    Pt states she has taken since intial  reaction without recurrence.  Has patient had a PCN reaction causing immediate rash, facial/tongue/throat swelling, SOB or lightheadedness with hypotension:  no Has patient had a PCN reaction causing severe rash involving mucus membranes or skin necrosis: no Has patient had a PCN reaction that required hospitalization no Has patient had a PCN reaction occurring within the last 10 years: no If all of the above answers are "NO", then may proceed with Cephalospori    Consultations:  Cardiology.    Procedures/Studies: Dg Chest 2 View  Result Date: 06/09/2016 CLINICAL DATA:  Central chest pain. EXAM: CHEST  2 VIEW COMPARISON:  03/15/2016 FINDINGS: Heart is upper limits normal in size. No confluent airspace opacities or effusions. No acute bony abnormality. IMPRESSION: No active cardiopulmonary disease. Electronically Signed   By: Rolm Baptise M.D.   On: 06/09/2016  10:27    none   Subjective:  No chest pain or sob.  Discharge Exam: Vitals:   06/11/16 0004 06/11/16 0401  BP: (!) 103/45 (!) 110/58  Pulse: 74 77  Resp: 11 15  Temp: 98 F (36.7 C) 97.9 F (36.6 C)   Vitals:   06/10/16 1647 06/10/16 2044 06/11/16 0004 06/11/16 0401  BP: 113/62 (!) 101/51 (!) 103/45 (!) 110/58  Pulse: 70 77 74 77  Resp:  17 11 15   Temp: 97.7 F (36.5 C) 98 F (36.7 C) 98 F (36.7 C) 97.9 F (36.6 C)  TempSrc: Axillary Oral Oral Oral  SpO2: 100% 98% 98% 97%  Weight:    126.1 kg (278 lb)  Height:        General: Pt is alert, awake, not in acute distress Cardiovascular: RRR, S1/S2 +, no rubs, no gallops Respiratory: CTA bilaterally, no wheezing, no rhonchi Abdominal: Soft, NT, ND,  bowel sounds + Extremities: no edema, no cyanosis    The results of significant diagnostics from this hospitalization (including imaging, microbiology, ancillary and laboratory) are listed below for reference.     Microbiology: Recent Results (from the past 240 hour(s))  MRSA PCR Screening     Status: None   Collection Time: 06/09/16  8:53 PM  Result Value Ref Range Status   MRSA by PCR NEGATIVE NEGATIVE Final    Comment:        The GeneXpert MRSA Assay (FDA approved for NASAL specimens only), is one component of a comprehensive MRSA colonization surveillance program. It is not intended to diagnose MRSA infection nor to guide or monitor treatment for MRSA infections.      Labs: BNP (last 3 results) No results for input(s): BNP in the last 8760 hours. Basic Metabolic Panel:  Recent Labs Lab 06/09/16 1008 06/09/16 2038 06/10/16 0825  NA 137  --  138  K 3.5  --  3.6  CL 102  --  105  CO2 27  --  24  GLUCOSE 134*  --  125*  BUN 13  --  12  CREATININE 0.68 0.74 0.68  CALCIUM 9.4  --  9.1   Liver Function Tests: No results for input(s): AST, ALT, ALKPHOS, BILITOT, PROT, ALBUMIN in the last 168 hours. No results for input(s): LIPASE, AMYLASE in the  last 168 hours. No results for input(s): AMMONIA in the last 168 hours. CBC:  Recent Labs Lab 06/09/16 1008 06/09/16 2038 06/10/16 0825  WBC 6.0 7.1 5.8  HGB 13.2 12.4 11.8*  HCT 40.5 39.1 36.3  MCV 83.9 83.7 83.4  PLT 197 262 240   Cardiac Enzymes:  Recent Labs Lab 06/09/16 2038 06/10/16 0825  TROPONINI <0.03 <0.03   BNP: Invalid input(s): POCBNP CBG: No results for input(s): GLUCAP in the last 168 hours. D-Dimer No results for input(s): DDIMER in the last 72 hours. Hgb A1c No results for input(s): HGBA1C in the last 72 hours. Lipid Profile No results for input(s): CHOL, HDL, LDLCALC, TRIG, CHOLHDL, LDLDIRECT in the last 72 hours. Thyroid function studies No results for input(s): TSH, T4TOTAL, T3FREE, THYROIDAB in the last 72 hours.  Invalid input(s): FREET3 Anemia work up No results for input(s): VITAMINB12, FOLATE, FERRITIN, TIBC, IRON, RETICCTPCT in the last 72 hours. Urinalysis    Component Value Date/Time   COLORURINE AMBER (A) 10/04/2014 1718   APPEARANCEUR TURBID (A) 10/04/2014 1718   LABSPEC >1.046 (H) 10/04/2014 1718   PHURINE 5.5 10/04/2014 1718   GLUCOSEU NEGATIVE 10/04/2014 1718   GLUCOSEU NEGATIVE 03/10/2007 1004   HGBUR MODERATE (A) 10/04/2014 1718   BILIRUBINUR SMALL (A) 10/04/2014 1718   KETONESUR NEGATIVE 10/04/2014 1718   PROTEINUR 30 (A) 10/04/2014 1718   UROBILINOGEN 1.0 10/04/2014 1718   NITRITE NEGATIVE 10/04/2014 1718   LEUKOCYTESUR NEGATIVE 10/04/2014 1718   Sepsis Labs Invalid input(s): PROCALCITONIN,  WBC,  LACTICIDVEN Microbiology Recent Results (from the past 240 hour(s))  MRSA PCR Screening     Status: None   Collection Time: 06/09/16  8:53 PM  Result Value Ref Range Status   MRSA by PCR NEGATIVE NEGATIVE Final    Comment:        The GeneXpert MRSA Assay (FDA approved for NASAL specimens only), is one component of a comprehensive MRSA colonization surveillance program. It is not intended to diagnose MRSA infection  nor to guide or monitor treatment for MRSA infections.      Time coordinating discharge: Over 30 minutes  SIGNEDHosie Poisson, MD  [facility] 06/12/2016, 8:56 AM Pager   If 7PM-7AM, please contact night-coverage [email or web address] Password [email or web address]

## 2016-06-16 ENCOUNTER — Encounter: Payer: Self-pay | Admitting: Family Medicine

## 2016-06-16 ENCOUNTER — Ambulatory Visit (INDEPENDENT_AMBULATORY_CARE_PROVIDER_SITE_OTHER): Payer: BLUE CROSS/BLUE SHIELD | Admitting: Family Medicine

## 2016-06-16 VITALS — BP 122/78 | HR 70 | Temp 98.4°F | Ht 69.0 in | Wt 277.0 lb

## 2016-06-16 DIAGNOSIS — Z9861 Coronary angioplasty status: Secondary | ICD-10-CM

## 2016-06-16 DIAGNOSIS — I2510 Atherosclerotic heart disease of native coronary artery without angina pectoris: Secondary | ICD-10-CM

## 2016-06-16 DIAGNOSIS — R0789 Other chest pain: Secondary | ICD-10-CM | POA: Diagnosis not present

## 2016-06-16 DIAGNOSIS — G4733 Obstructive sleep apnea (adult) (pediatric): Secondary | ICD-10-CM | POA: Diagnosis not present

## 2016-06-16 NOTE — Patient Instructions (Signed)
Await pulmonary call about Cpap and sleep apnea.. Call them if they contact you.  Set up follow up appt with cardiology at [facility].

## 2016-06-16 NOTE — Assessment & Plan Note (Signed)
Reviewed results of sleep test with pt. Pulm to contact her to set up CPAP. This should help with energy, HA and blood pressure control.

## 2016-06-16 NOTE — Progress Notes (Signed)
Pre visit review using our clinic review tool, if applicable. No additional management support is needed unless otherwise documented below in the visit note. 

## 2016-06-16 NOTE — Progress Notes (Signed)
Subjective:    Patient ID: Christine Barrett, female    DOB: April 18, 1961, 54 y.o.   MRN: [id number]  HPI   55 year old female with history of CAD, HTN,  DM, hyperlipidemia, GERD presents for hospital follow up.  She was seen on 06/09/2016 for chest pain.  Summary of hospital visit below: Chest pain: With underlying history of coronary artery disease. NSTEMI in 2015 Patient underwent cardiac cath in 2/17 which had shown 80% stenosis Ost 2ngdiagonal, 60% stenosis in the proximal to mid LAD, 50% stenosis in the proximal to mid circumflex.  - Admittd to stepdown due to ongoing chest pain  - currently chest pain resolved. She is ruled out for MI.  - Cardiology has been consulted, recommended to outpatient follow up with a stress test .  - Lipid panel showed LDL of 125 continue statin, aspirin, beta blocker, Imdur - pt would like a second opinion at [facility] or [place] .     Today:  Pt reports she is frustrated overall given continued issues.  Had chest pain.. Not resolved with nitroglycerin. Pain continued 2 days after  She has slight chest tightness, no pain, no SOB. She is back at work taking it easy.  not eating out.. Trying to make healthier choices.  Has been under a lot of stress. . She is now done with school so stress should be better. Plans to try to start exercise plan.  GI Dr. Hilarie Fredrickson  Has tramadol to use for constocondritis.   BP Readings from Last 3 Encounters:  06/16/16 122/78  06/11/16 (!) 110/58  06/02/16 (!) 150/94      Review of Systems  Constitutional: Positive for fatigue. Negative for fever.  HENT: Negative for ear pain.   Eyes: Negative for pain.  Respiratory: Positive for chest tightness. Negative for cough and shortness of breath.   Cardiovascular: Negative for chest pain, palpitations and leg swelling.  Gastrointestinal: Negative for abdominal distention.       Objective:   Physical Exam  Constitutional: Vital signs are normal. She appears  well-developed and well-nourished. She is cooperative.  Non-toxic appearance. She does not appear ill. No distress.  HENT:  Head: Normocephalic.  Right Ear: Hearing, tympanic membrane, external ear and ear canal normal. Tympanic membrane is not erythematous, not retracted and not bulging.  Left Ear: Hearing, tympanic membrane, external ear and ear canal normal. Tympanic membrane is not erythematous, not retracted and not bulging.  Nose: No mucosal edema or rhinorrhea. Right sinus exhibits no maxillary sinus tenderness and no frontal sinus tenderness. Left sinus exhibits no maxillary sinus tenderness and no frontal sinus tenderness.  Mouth/Throat: Uvula is midline, oropharynx is clear and moist and mucous membranes are normal.  Eyes: Conjunctivae, EOM and lids are normal. Pupils are equal, round, and reactive to light. Lids are everted and swept, no foreign bodies found.  Neck: Trachea normal and normal range of motion. Neck supple. Carotid bruit is not present. No thyroid mass and no thyromegaly present.  Cardiovascular: Normal rate, regular rhythm, S1 normal, S2 normal, normal heart sounds, intact distal pulses and normal pulses.  Exam reveals no gallop and no friction rub.   No murmur heard. Pulmonary/Chest: Effort normal and breath sounds normal. No tachypnea. No respiratory distress. She has no decreased breath sounds. She has no wheezes. She has no rhonchi. She has no rales.  Abdominal: Soft. Normal appearance and bowel sounds are normal. There is no tenderness.  Neurological: She is alert.  Skin: Skin is warm, dry  and intact. No rash noted.  Psychiatric: Her speech is normal and behavior is normal. Judgment and thought content normal. Her mood appears not anxious. Cognition and memory are normal. She does not exhibit a depressed mood.          Assessment & Plan:

## 2016-06-16 NOTE — Assessment & Plan Note (Signed)
Not clearly heart related.  ? Possible costocondritis.  Start chest wall stretches, tramadol prn as long as does not cause GI upset.

## 2016-06-16 NOTE — Telephone Encounter (Signed)
Results have been explained to patient, pt expressed understanding.  Order placed for CPAP  Appt scheduled with Dr Halford Chessman on  08/19/15 at 945a. Nothing further needed.

## 2016-06-18 DIAGNOSIS — I25118 Atherosclerotic heart disease of native coronary artery with other forms of angina pectoris: Secondary | ICD-10-CM | POA: Diagnosis not present

## 2016-06-18 DIAGNOSIS — K219 Gastro-esophageal reflux disease without esophagitis: Secondary | ICD-10-CM | POA: Diagnosis not present

## 2016-06-24 DIAGNOSIS — R51 Headache: Secondary | ICD-10-CM | POA: Diagnosis not present

## 2016-06-24 DIAGNOSIS — G4733 Obstructive sleep apnea (adult) (pediatric): Secondary | ICD-10-CM | POA: Diagnosis not present

## 2016-06-24 DIAGNOSIS — R0683 Snoring: Secondary | ICD-10-CM | POA: Diagnosis not present

## 2016-06-25 DIAGNOSIS — I10 Essential (primary) hypertension: Secondary | ICD-10-CM | POA: Diagnosis not present

## 2016-06-25 DIAGNOSIS — I2510 Atherosclerotic heart disease of native coronary artery without angina pectoris: Secondary | ICD-10-CM | POA: Diagnosis not present

## 2016-06-25 DIAGNOSIS — I208 Other forms of angina pectoris: Secondary | ICD-10-CM | POA: Diagnosis not present

## 2016-06-25 DIAGNOSIS — E785 Hyperlipidemia, unspecified: Secondary | ICD-10-CM | POA: Diagnosis not present

## 2016-07-25 DIAGNOSIS — R51 Headache: Secondary | ICD-10-CM | POA: Diagnosis not present

## 2016-07-25 DIAGNOSIS — R0683 Snoring: Secondary | ICD-10-CM | POA: Diagnosis not present

## 2016-07-25 DIAGNOSIS — G4733 Obstructive sleep apnea (adult) (pediatric): Secondary | ICD-10-CM | POA: Diagnosis not present

## 2016-08-18 ENCOUNTER — Ambulatory Visit: Payer: BLUE CROSS/BLUE SHIELD | Admitting: Pulmonary Disease

## 2016-08-20 ENCOUNTER — Telehealth: Payer: Self-pay | Admitting: Family Medicine

## 2016-08-20 DIAGNOSIS — D649 Anemia, unspecified: Secondary | ICD-10-CM

## 2016-08-20 DIAGNOSIS — E785 Hyperlipidemia, unspecified: Secondary | ICD-10-CM

## 2016-08-20 DIAGNOSIS — R7303 Prediabetes: Secondary | ICD-10-CM

## 2016-08-20 NOTE — Telephone Encounter (Signed)
-----   Message from Ellamae Sia sent at 08/18/2016 11:01 AM EST ----- Regarding: Lab orders for Wednesday, 2.28.18 Lab orders for a 3 month follow up appt.

## 2016-08-25 DIAGNOSIS — R51 Headache: Secondary | ICD-10-CM | POA: Diagnosis not present

## 2016-08-25 DIAGNOSIS — R0683 Snoring: Secondary | ICD-10-CM | POA: Diagnosis not present

## 2016-08-25 DIAGNOSIS — G4733 Obstructive sleep apnea (adult) (pediatric): Secondary | ICD-10-CM | POA: Diagnosis not present

## 2016-08-26 ENCOUNTER — Other Ambulatory Visit (INDEPENDENT_AMBULATORY_CARE_PROVIDER_SITE_OTHER): Payer: BLUE CROSS/BLUE SHIELD

## 2016-08-26 DIAGNOSIS — R7303 Prediabetes: Secondary | ICD-10-CM

## 2016-08-26 DIAGNOSIS — E785 Hyperlipidemia, unspecified: Secondary | ICD-10-CM

## 2016-08-26 LAB — COMPREHENSIVE METABOLIC PANEL
ALK PHOS: 64 U/L (ref 39–117)
ALT: 14 U/L (ref 0–35)
AST: 13 U/L (ref 0–37)
Albumin: 3.9 g/dL (ref 3.5–5.2)
BILIRUBIN TOTAL: 0.6 mg/dL (ref 0.2–1.2)
BUN: 11 mg/dL (ref 6–23)
CO2: 26 meq/L (ref 19–32)
CREATININE: 0.75 mg/dL (ref 0.40–1.20)
Calcium: 9.2 mg/dL (ref 8.4–10.5)
Chloride: 108 mEq/L (ref 96–112)
GFR: 103.23 mL/min (ref >60.00)
GLUCOSE: 123 mg/dL — AB (ref 70–99)
Potassium: 3.8 mEq/L (ref 3.5–5.1)
SODIUM: 140 meq/L (ref 135–145)
TOTAL PROTEIN: 7 g/dL (ref 6.0–8.3)

## 2016-08-26 LAB — LIPID PANEL
CHOL/HDL RATIO: 5
Cholesterol: 193 mg/dL (ref 0–200)
HDL: 41 mg/dL (ref >39.00)
LDL Cholesterol: 121 mg/dL — ABNORMAL HIGH (ref 0–99)
NONHDL: 151.91
Triglycerides: 154 mg/dL — ABNORMAL HIGH (ref 0.0–149.0)
VLDL: 30.8 mg/dL (ref 0.0–40.0)

## 2016-08-26 LAB — HEMOGLOBIN A1C: Hgb A1c MFr Bld: 6.4 % (ref 4.6–6.5)

## 2016-08-26 NOTE — Addendum Note (Signed)
Addended by: Marchia Bond on: 08/26/2016 08:08 AM   Modules accepted: Orders

## 2016-08-28 ENCOUNTER — Ambulatory Visit (INDEPENDENT_AMBULATORY_CARE_PROVIDER_SITE_OTHER): Payer: BLUE CROSS/BLUE SHIELD | Admitting: Family Medicine

## 2016-08-28 ENCOUNTER — Encounter: Payer: Self-pay | Admitting: Family Medicine

## 2016-08-28 DIAGNOSIS — M25562 Pain in left knee: Secondary | ICD-10-CM

## 2016-08-28 DIAGNOSIS — J301 Allergic rhinitis due to pollen: Secondary | ICD-10-CM | POA: Diagnosis not present

## 2016-08-28 DIAGNOSIS — E785 Hyperlipidemia, unspecified: Secondary | ICD-10-CM

## 2016-08-28 DIAGNOSIS — R7303 Prediabetes: Secondary | ICD-10-CM | POA: Diagnosis not present

## 2016-08-28 DIAGNOSIS — I10 Essential (primary) hypertension: Secondary | ICD-10-CM | POA: Diagnosis not present

## 2016-08-28 DIAGNOSIS — M25561 Pain in right knee: Secondary | ICD-10-CM | POA: Insufficient documentation

## 2016-08-28 NOTE — Assessment & Plan Note (Signed)
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 No known injury.  Start home PT, glucosamine. Consider imaging to rule out OA if not improving.

## 2016-08-28 NOTE — Assessment & Plan Note (Signed)
Eelvated with decongestant use.  Pt told to stop. Will treat nasal pressure  Safer way.

## 2016-08-28 NOTE — Assessment & Plan Note (Signed)
Pt refuses to increase atorvastatin as not tolerated in past at higher dose. Will continue to work on lifestyle changes.

## 2016-08-28 NOTE — Progress Notes (Signed)
Pre visit review using our clinic review tool, if applicable. No additional management support is needed unless otherwise documented below in the visit note. 

## 2016-08-28 NOTE — Progress Notes (Signed)
Subjective:    Patient ID: Christine Barrett, female    DOB: Feb 27, 1961, 56 y.o.   MRN: [id number]  HPI    56 year old female presents for 3 month follow up.   Hypertension:  Coreg and losartan HCTZ .[name] She has been taking advil cold and sinus... With decongestant.. In last 3 week.  No nasal discahrge, no fever.  Using  Zyrtec. Using medication without problems or lightheadedness:  Chest pain with exertion: None with exertion... Still having [pain in chest spasms off and on worse in cold weather.  She has been trying to decrease stress, go to sleep earlier. Edema: none Short of breath: none Average home BPs:  Other issues:   Pt with CAD  (On imdur for angina)but  In 05/2017 chest pain was atypical and not felt to be due to CAD. More likely costochondritis or GERD.  Pt went to [facility] for second opinion:  Dr. Maylon Peppers. Note from 06/18/2017 reviewed.  Had repeat stress test on 07/01/2016:  nml study.  Elevated Cholesterol:  On atorvastatin 40 mg daily ( had SE at higher dose  80 mg ). LDL not at goal. Lab Results  Component Value Date   CHOL 193 08/26/2016   HDL 41.00 08/26/2016   LDLCALC 121 (H) 08/26/2016   LDLDIRECT 192.2 02/11/2009   TRIG 154.0 (H) 08/26/2016   CHOLHDL 5 08/26/2016  Using medications without problems: Muscle aches:  Diet compliance: She has been cutting out carbohydrates, avoiding fried food Exercise: None Other complaints:  Had gotten up to 289.. Now down 10 lbs. Wt Readings from Last 3 Encounters:  08/28/16 276 lb 8 oz (125.4 kg)  06/16/16 277 lb (125.6 kg)  06/11/16 278 lb (126.1 kg)      Has been having pain in left knee in last 4-5 months, worse in last 1-2 months.  no swelling, no redness. PAin with  Sitting for a while. PAin with squatting. Some popping and grinding.  Occ feeling like it may give out on her.  No known injury. No past knee issues or surgeries.   Review of Systems  Constitutional: Negative for fatigue and fever.  HENT: Negative for ear  pain and facial swelling.   Eyes: Negative for pain.  Respiratory: Negative for cough and shortness of breath.   Cardiovascular: Positive for chest pain. Negative for palpitations and leg swelling.  Gastrointestinal: Negative for abdominal distention.       Objective:   Physical Exam  Constitutional: Vital signs are normal. She appears well-developed and well-nourished. She is cooperative.  Non-toxic appearance. She does not appear ill. No distress.  HENT:  Head: Normocephalic.  Right Ear: Hearing, external ear and ear canal normal. Tympanic membrane is not erythematous, not retracted and not bulging. A middle ear effusion is present.  Left Ear: Hearing, tympanic membrane, external ear and ear canal normal. Tympanic membrane is not erythematous, not retracted and not bulging.  No middle ear effusion.  Nose: Mucosal edema present. No rhinorrhea. Right sinus exhibits maxillary sinus tenderness. Right sinus exhibits no frontal sinus tenderness. Left sinus exhibits maxillary sinus tenderness. Left sinus exhibits no frontal sinus tenderness.  Mouth/Throat: Uvula is midline, oropharynx is clear and moist and mucous membranes are normal.  Eyes: Conjunctivae, EOM and lids are normal. Pupils are equal, round, and reactive to light. Lids are everted and swept, no foreign bodies found.  Neck: Trachea normal and normal range of motion. Neck supple. Carotid bruit is not present. No thyroid mass and no thyromegaly present.  Cardiovascular: Normal rate, regular rhythm, S1 normal, S2 normal, normal heart sounds, intact distal pulses and normal pulses.  Exam reveals no gallop and no friction rub.   No murmur heard. Pulmonary/Chest: Effort normal and breath sounds normal. No tachypnea. No respiratory distress. She has no decreased breath sounds. She has no wheezes. She has no rhonchi. She has no rales.  Abdominal: Soft. Normal appearance and bowel sounds are normal. There is no tenderness.  Musculoskeletal:        Left knee: She exhibits normal range of motion, no swelling, no effusion, no bony tenderness and normal meniscus. Tenderness found. Lateral joint line tenderness noted. No MCL, no LCL and no patellar tendon tenderness noted.   Crepitus in knee joint on left  Neurological: She is alert.  Skin: Skin is warm, dry and intact. No rash noted.  Psychiatric: Her speech is normal and behavior is normal. Judgment and thought content normal. Her mood appears not anxious. Cognition and memory are normal. She does not exhibit a depressed mood.          Assessment & Plan:

## 2016-08-28 NOTE — Patient Instructions (Addendum)
Work on starting regular exercise. Work on low carb and low cholesterol diet.  Start chest wall stretching.  Stop decongestant.  Start flonase 2 sprays per nostril daily.  Continue BP meds.

## 2016-08-28 NOTE — Assessment & Plan Note (Signed)
Causing sinus pressure. Pt refused prednisone to treat. Stop decongestant. Will try trial of flonase. No sign of bacterial infection.

## 2016-08-28 NOTE — Assessment & Plan Note (Signed)
Borderline. Counseled pyt on low carb diet. Will follow up in 6 months.

## 2016-10-03 DIAGNOSIS — M2242 Chondromalacia patellae, left knee: Secondary | ICD-10-CM | POA: Diagnosis not present

## 2016-10-03 DIAGNOSIS — M1712 Unilateral primary osteoarthritis, left knee: Secondary | ICD-10-CM | POA: Diagnosis not present

## 2016-10-05 ENCOUNTER — Telehealth: Payer: Self-pay

## 2016-10-05 NOTE — Telephone Encounter (Signed)
Pt was seen 08/28/16; pt has been moving into new home and pt thinks she over did it with her knee. Pt seen 08/28/16 and discussed knee pain and pt went to [facility] on 09/02/16 and had xray of knee which showed losing cartilage in knee and pt has arthritis. Pt works on commission and cannot miss work; Pt got cortisone shot at [facility]. Pt cannot take a lot of ibuprofen due to reflux; pt request med for knee pain.[facility]. Pt request cb.

## 2016-10-06 ENCOUNTER — Telehealth: Payer: Self-pay | Admitting: *Deleted

## 2016-10-06 MED ORDER — DICLOFENAC SODIUM 1 % TD GEL: 4.0000 g | Freq: Four times a day (QID) | TRANSDERMAL | 0 refills | Status: DC

## 2016-10-06 NOTE — Telephone Encounter (Signed)
Ms. Dancy notified as instructed by telephone. 

## 2016-10-06 NOTE — Telephone Encounter (Signed)
Lets have her try topical voltaren.. It is an anti-inflammatory but does not go through the stomach.

## 2016-10-06 NOTE — Telephone Encounter (Signed)
Received fax from [facility] requesting PA for Diclofenac Gel 1%.  PA completed on CoverMyMeds and approved.  [facility] notified of approval via fax. 

## 2016-10-11 ENCOUNTER — Other Ambulatory Visit: Payer: Self-pay | Admitting: Family Medicine

## 2016-10-21 ENCOUNTER — Ambulatory Visit (INDEPENDENT_AMBULATORY_CARE_PROVIDER_SITE_OTHER): Payer: BLUE CROSS/BLUE SHIELD | Admitting: Primary Care

## 2016-10-21 ENCOUNTER — Encounter: Payer: Self-pay | Admitting: Primary Care

## 2016-10-21 VITALS — BP 134/86 | HR 86 | Temp 98.8°F | Ht 69.0 in | Wt 273.0 lb

## 2016-10-21 DIAGNOSIS — B9689 Other specified bacterial agents as the cause of diseases classified elsewhere: Secondary | ICD-10-CM | POA: Diagnosis not present

## 2016-10-21 DIAGNOSIS — J019 Acute sinusitis, unspecified: Secondary | ICD-10-CM | POA: Diagnosis not present

## 2016-10-21 MED ORDER — AZITHROMYCIN 250 MG PO TABS: ORAL_TABLET | ORAL | 0 refills | Status: DC

## 2016-10-21 MED ORDER — HYDROCODONE-HOMATROPINE 5-1.5 MG/5ML PO SYRP: 5.0000 mL | ORAL_SOLUTION | Freq: Three times a day (TID) | ORAL | 0 refills | Status: DC | PRN

## 2016-10-21 NOTE — Progress Notes (Signed)
Pre visit review using our clinic review tool, if applicable. No additional management support is needed unless otherwise documented below in the visit note. 

## 2016-10-21 NOTE — Progress Notes (Signed)
Subjective:    Patient ID: Christine Barrett, female    DOB: 03/18/1961, 56 y.o.   MRN: [id number]  HPI  Christine Barrett is a 56 year old female with a history of OSA, allergic rhinitis, GERD, hypertension managed on ARB who presents today with a chief complaint of sinus pressure. She also reports cough, chest congestion, nasal congestion, sore throat. Her symptoms began 2 weeks ago, yesterday her symptoms progressed causing fatigue and inability to cough up mucous. She's been using Flonase daily. She's not been taking her Zyrtec as she ran out three weeks ago. She denies fevers.   Review of Systems  Constitutional: Positive for chills and fatigue. Negative for fever.  HENT: Positive for congestion, sinus pressure and sore throat. Negative for ear pain.   Respiratory: Positive for cough. Negative for shortness of breath.   Cardiovascular: Negative for chest pain.       Past Medical History:  Diagnosis Date  . Allergy    SEASONAL  . Anemia   . CAD S/P percutaneous coronary angioplasty 09/2013   a) Ostial AV G Cx - 2.5 mm Angiosculpt; mid LAD 40-60%; b) Myoview 07/2014: LOW RISK, small-severe fixed inferior defect c/w infarct w/o peri-infarct ischemia.  . Chronic bronchitis ([place])    "frequently; not q yr" (10/03/2013)  . Diverticulosis   . Essential hypertension    with prior Accelerated HTN  . Hiatal hernia   . Hx of non-ST elevation myocardial infarction (NSTEMI) 10/01/2013   Due to Accelerated HTN with existing CAD  . Hyperlipemia   . Migraine    "@ least once/month" (10/03/2013)  . OSA (obstructive sleep apnea) 06/10/2016  . Schatzki's ring   . Seasonal allergies   . Sinusitis      Social History   Social History  . Marital status: Married    Spouse name: Herbie Baltimore  . Number of children: 2  . Years of education: N/A   Occupational History  . Research scientist (life sciences)    Social History Main Topics  . Smoking status: Never Smoker  . Smokeless tobacco: Never Used  . Alcohol use No  . Drug  use: No  . Sexual activity: Yes    Birth control/ protection: Surgical   Other Topics Concern  . Not on file   Social History Narrative   She is a married mother of 2, grandmother 2. Her current marriage is than a pack years.    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 She never smoked and does not drink All. She does do time and balancing exercises but does not do routine of exercise.   She herself is adopted.    Past Surgical History:  Procedure Laterality Date  . ABDOMINAL HYSTERECTOMY  1994   "partial"  . CARDIAC CATHETERIZATION N/A 08/05/2015   Procedure: Left Heart Cath and Coronary Angiography;  Surgeon: [name], MD;  Location: [facility] [place] [facility];  Service: Cardiovascular;  Laterality: N/A;  . CESAREAN SECTION  1989  . COLONOSCOPY    . CORONARY ANGIOPLASTY  10/03/2013   95% ostial AV G Cx - 2.5 mm AngioSculpt Balloon PTCA; mid LAD 40-60%  . LEFT HEART CATHETERIZATION WITH CORONARY ANGIOGRAM N/A 10/02/2013   Procedure: LEFT HEART CATHETERIZATION WITH CORONARY ANGIOGRAM;  Surgeon: [name], MD;  Location: [facility] LAB;  Service: Cardiovascular;  Laterality: N/A;  . NASAL SEPTOPLASTY W/ TURBINOPLASTY  ~ 2007  . NM MYOVIEW  LTD  08/22/2014    Low risk stress nuclear study with a small, severe, fixed defect in the distal inferior wall/apex suggestive of small prior infarct; no ischemia.  LV Wall Motion: NL LV Function; NL Wall Motion  . PERCUTANEOUS CORONARY STENT INTERVENTION (PCI-S) N/A 10/03/2013   cutting balloon angioplasty only no stent.   . TRANSTHORACIC ECHOCARDIOGRAM  10/02/2013   EF 55-60%; mild LVH, no RWMA,   . TUBAL LIGATION  1989    Family History  Problem Relation Age of Onset  . Adopted: Yes  . Diabetes Mother   . Breast cancer Sister   . Hypertension Sister   . Colon cancer Neg Hx   . Heart attack Neg Hx   . Stroke Neg Hx     Allergies  Allergen Reactions  . Penicillins  Rash    Pt states she has taken since intial  reaction without recurrence.  Has patient had a PCN reaction causing immediate rash, facial/tongue/throat swelling, SOB or lightheadedness with hypotension:  no Has patient had a PCN reaction causing severe rash involving mucus membranes or skin necrosis: no Has patient had a PCN reaction that required hospitalization no Has patient had a PCN reaction occurring within the last 10 years: no If all of the above answers are "NO", then may proceed with Cephalospori    Current Outpatient Prescriptions on File Prior to Visit  Medication Sig Dispense Refill  . Albuterol Sulfate 108 (90 Base) MCG/ACT AEPB Inhale 2 puffs into the lungs every 6 (six) hours as needed (shortness of breath).     [name] aluminum hydroxide-magnesium carbonate (GAVISCON) 95-358 MG/15ML SUSP Take 15 mLs by mouth every 6 (six) hours as needed for heartburn.    [name] aspirin EC 81 MG EC tablet Take 1 tablet (81 mg total) by mouth daily.    [name] atorvastatin (LIPITOR) 40 MG tablet TAKE ONE TABLET BY MOUTH ONCE DAILY 90 tablet 3  . carvedilol (COREG) 25 MG tablet Take 1 tablet (25 mg total) by mouth 3 (three) times daily. (Patient taking differently: Take 25 mg by mouth 2 (two) times daily with a meal. ) 90 tablet 11  . cetirizine (ZYRTEC) 10 MG tablet Take 10 mg by mouth daily.    . diclofenac sodium (VOLTAREN) 1 % GEL Apply 4 g topically 4 (four) times daily. 100 g 0  . fluticasone (FLONASE) 50 MCG/ACT nasal spray Place 2 sprays into both nostrils daily as needed for allergies or rhinitis.     [name] isosorbide mononitrate (IMDUR) 120 MG 24 hr tablet Take 1 tablet (120 mg total) by mouth daily. 90 tablet 4  . losartan-hydrochlorothiazide (HYZAAR) 100-25 MG tablet Take 1 tablet by mouth daily. 90 tablet 3  . Multiple Vitamin (MULTIVITAMIN WITH MINERALS) TABS tablet Take 1 tablet by mouth daily.    . Multiple Vitamins-Minerals (EMERGEN-C IMMUNE) PACK Take 1 packet by mouth daily.    . nitroGLYCERIN  (NITROSTAT) 0.4 MG SL tablet Place 1 tablet (0.4 mg total) under the tongue every 5 (five) minutes as needed for chest pain. May take up to 3 tablets. If still having chest pain, call 911 immediately. 20 tablet 0  . pantoprazole (PROTONIX) 40 MG tablet Take 1 tablet (40 mg total) by mouth 2 (two) times daily before a meal. 60 tablet 2  . ranitidine (ZANTAC) 150 MG tablet Take 1 tablet (150 mg total) by mouth daily. 30 tablet 2   No current facility-administered medications on file prior to visit.     BP 134/86  Pulse 86   Temp 98.8 F (37.1 C) (Oral)   Ht 5\' 9"  (1.753 m)   Wt 273 lb (123.8 kg)   SpO2 98%   BMI 40.32 kg/m    Objective:   Physical Exam  Constitutional: She appears well-nourished. She appears ill.  HENT:  Right Ear: Tympanic membrane and ear canal normal.  Left Ear: Tympanic membrane and ear canal normal.  Nose: Mucosal edema present. Right sinus exhibits frontal sinus tenderness. Right sinus exhibits no maxillary sinus tenderness. Left sinus exhibits frontal sinus tenderness. Left sinus exhibits no maxillary sinus tenderness.  Mouth/Throat: Oropharynx is clear and moist.  Eyes: Conjunctivae are normal.  Neck: Neck supple.  Cardiovascular: Normal rate and regular rhythm.   Pulmonary/Chest: Effort normal and breath sounds normal. She has no wheezes. She has no rales.  Congested cough on exam  Lymphadenopathy:    She has no cervical adenopathy.  Skin: Skin is warm and dry.          Assessment & Plan:  Acute Sinusitis:  Sinus pressure x  2 weeks, now with cough and chest congestion. No improvement with OTC treatment. Exam today consistent with bacterial involvement. Rx for Zpak sent to pharmacy (PCN allergy).  Rx printed for Hycodan to use PRN. Discussed to continue Flonase, restart Zyrtec. Follow up PRN.  Sheral Flow, NP

## 2016-10-21 NOTE — Patient Instructions (Signed)
Start Azithromycin antibiotics. Take 2 tablets by mouth today, then 1 tablet daily for 4 additional days.  You may take the Hycodan cough suppressant every 8 hours as needed for cough and rest. Caution this medication contains codeine and will make you feel drowsy.  Continue Flonase, restart Zyrtec.  Ensure you are staying hydrated and rest.  It was a pleasure meeting you!

## 2016-10-27 ENCOUNTER — Other Ambulatory Visit: Payer: Self-pay | Admitting: Internal Medicine

## 2016-11-04 ENCOUNTER — Telehealth: Payer: Self-pay | Admitting: Family Medicine

## 2016-11-04 ENCOUNTER — Ambulatory Visit (INDEPENDENT_AMBULATORY_CARE_PROVIDER_SITE_OTHER): Payer: BLUE CROSS/BLUE SHIELD | Admitting: Family Medicine

## 2016-11-04 ENCOUNTER — Encounter: Payer: Self-pay | Admitting: Family Medicine

## 2016-11-04 DIAGNOSIS — J019 Acute sinusitis, unspecified: Secondary | ICD-10-CM | POA: Insufficient documentation

## 2016-11-04 DIAGNOSIS — J0100 Acute maxillary sinusitis, unspecified: Secondary | ICD-10-CM | POA: Insufficient documentation

## 2016-11-04 DIAGNOSIS — J0140 Acute pansinusitis, unspecified: Secondary | ICD-10-CM | POA: Diagnosis not present

## 2016-11-04 MED ORDER — RANITIDINE HCL 150 MG PO TABS: 150.0000 mg | ORAL_TABLET | Freq: Every day | ORAL | 11 refills | Status: DC

## 2016-11-04 MED ORDER — PREDNISONE 20 MG PO TABS: ORAL_TABLET | ORAL | 0 refills | Status: DC

## 2016-11-04 MED ORDER — LOSARTAN POTASSIUM 100 MG PO TABS: 100.0000 mg | ORAL_TABLET | Freq: Every day | ORAL | 11 refills | Status: DC

## 2016-11-04 NOTE — Telephone Encounter (Signed)
°  Patient Name: Christine Barrett  DOB: 12/31/1960    Initial Comment Caller is having sinus pressure, BP is elevated , it was 156/85 this morning, has headaches.    Nurse Assessment  Nurse: Julien Girt, RN, Almyra Free Date/Time Eilene Ghazi Time): 11/04/2016 12:42:15 PM  Confirm and document reason for call. If symptomatic, describe symptoms. ---Caller states she has sinus pain and pressure, she is coughing, her BP this morning was 156/85 and she has a headache. The nasal congestion with sinus pain and pressure began about 2 weeks ago and she has a headache for more than 7 days. She was seen in the office for the sinus sx on 10/21/16, was given 4 days worth of abx but never improved.  Does the patient have any new or worsening symptoms? ---Yes  Will a triage be completed? ---Yes  Related visit to physician within the last 2 weeks? ---Yes  Does the PT have any chronic conditions? (i.e. diabetes, asthma, etc.) ---Yes  List chronic conditions. ---Htn, High cholesterol, Hx angioplasty/MI/CAD, Seasonal allergies  Is this a behavioral health or substance abuse call? ---No     Guidelines    Guideline Title Affirmed Question Affirmed Notes  Cough - Acute Productive [1] Continuous (nonstop) coughing interferes with work or school AND [2] no improvement using cough treatment per Care Advice   Headache [1] MODERATE headache (e.g., interferes with normal activities) AND [2] present > 24 hours AND [3] unexplained (Exceptions: analgesics not tried, typical migraine, or headache part of viral illness)    Final Disposition User   See Physician within [place], RN, Almyra Free    Referrals  REFERRED TO PCP OFFICE   Disagree/Comply: Comply    Disagree/Comply: Comply

## 2016-11-04 NOTE — Progress Notes (Signed)
   Subjective:    Patient ID: Christine Barrett, female    DOB: 1960-10-31, 56 y.o.   MRN: [id number]  Gerlene Fee  on  4/25  Treated with antibiotics with z-pack.   Sinusitis  This is a new problem. The current episode started 1 to 4 weeks ago ( 2 weeks). The problem has been gradually worsening since onset. There has been no fever. Her pain is at a severity of 8/10. Associated symptoms include coughing, headaches, sinus pressure and sneezing. Pertinent negatives include no chills or sore throat. (Nasal congestion, post nasal drip.) Past treatments include oral decongestants (Zyrtec, excedrine, tylenol, flonase, nasal saline rinse.). The treatment provided mild relief.    She stopped losartan HCTZ for 7 days... Causes leg cramps and frquent urination while on. Resolved off it. No swelling. When she restarted it SE came back.  BP today is back off the med.    Blood pressure 140/80, pulse 80, temperature 98.4 F (36.9 C), temperature source Oral, height 5\' 9"  (1.753 m), weight 284 lb (128.8 kg).  Review of Systems  Constitutional: Negative for chills.  HENT: Positive for sinus pressure and sneezing. Negative for sore throat.   Respiratory: Positive for cough.   Neurological: Positive for headaches.       Objective:   Physical Exam  Constitutional: Vital signs are normal. She appears well-developed and well-nourished. She is cooperative.  Non-toxic appearance. She does not appear ill. No distress.  HENT:  Head: Normocephalic.  Right Ear: Hearing, external ear and ear canal normal. Tympanic membrane is not erythematous, not retracted and not bulging. A middle ear effusion is present.  Left Ear: Hearing, external ear and ear canal normal. Tympanic membrane is not erythematous, not retracted and not bulging. A middle ear effusion is present.  Nose: Mucosal edema and rhinorrhea present. Right sinus exhibits maxillary sinus tenderness and frontal sinus tenderness. Left sinus exhibits  maxillary sinus tenderness and frontal sinus tenderness.  Mouth/Throat: Uvula is midline, oropharynx is clear and moist and mucous membranes are normal.  Eyes: Conjunctivae, EOM and lids are normal. Pupils are equal, round, and reactive to light. Lids are everted and swept, no foreign bodies found.  Neck: Trachea normal and normal range of motion. Neck supple. Carotid bruit is not present. No thyroid mass and no thyromegaly present.  Cardiovascular: Normal rate, regular rhythm, S1 normal, S2 normal, normal heart sounds, intact distal pulses and normal pulses.  Exam reveals no gallop and no friction rub.   No murmur heard. Pulmonary/Chest: Effort normal and breath sounds normal. No tachypnea. No respiratory distress. She has no decreased breath sounds. She has no wheezes. She has no rhonchi. She has no rales.  Neurological: She is alert.  Skin: Skin is warm, dry and intact. No rash noted.  Psychiatric: Her speech is normal and behavior is normal. Judgment normal. Her mood appears not anxious. Cognition and memory are normal. She does not exhibit a depressed mood.          Assessment & Plan:

## 2016-11-04 NOTE — Progress Notes (Signed)
Pre visit review using our clinic review tool, if applicable. No additional management support is needed unless otherwise documented below in the visit note. 

## 2016-11-04 NOTE — Telephone Encounter (Signed)
Pt has appt with Dr [name] 11/04/16 at 4pm.

## 2016-11-04 NOTE — Assessment & Plan Note (Signed)
No clear bacterial infection at this point.  Most likely poor allergy control is trigger. Treat with steroid taper for pain and inflammation. Nasal saline rise, change to Xyzal.

## 2016-11-04 NOTE — Patient Instructions (Signed)
Stop zyrtec. Start Xyzal at bedtime No decongestants. Complete prednisone course x 6 days. Call if fever or increasing sinus pain. Continue daily sinus rinse. Hold flonase while on prednisone. Make change to losartan alone.. Without HCTZ. Follow BP at home.

## 2016-11-06 ENCOUNTER — Ambulatory Visit: Payer: BLUE CROSS/BLUE SHIELD | Admitting: Family Medicine

## 2016-11-27 ENCOUNTER — Other Ambulatory Visit: Payer: Self-pay | Admitting: Cardiology

## 2016-12-01 ENCOUNTER — Ambulatory Visit (INDEPENDENT_AMBULATORY_CARE_PROVIDER_SITE_OTHER): Payer: BLUE CROSS/BLUE SHIELD | Admitting: Family Medicine

## 2016-12-01 ENCOUNTER — Encounter: Payer: Self-pay | Admitting: Family Medicine

## 2016-12-01 DIAGNOSIS — R51 Headache: Secondary | ICD-10-CM

## 2016-12-01 DIAGNOSIS — I10 Essential (primary) hypertension: Secondary | ICD-10-CM

## 2016-12-01 DIAGNOSIS — J301 Allergic rhinitis due to pollen: Secondary | ICD-10-CM | POA: Diagnosis not present

## 2016-12-01 DIAGNOSIS — R519 Headache, unspecified: Secondary | ICD-10-CM | POA: Insufficient documentation

## 2016-12-01 MED ORDER — CARVEDILOL 25 MG PO TABS: 25.0000 mg | ORAL_TABLET | Freq: Two times a day (BID) | ORAL | 3 refills | Status: DC

## 2016-12-01 MED ORDER — ALBUTEROL SULFATE HFA 108 (90 BASE) MCG/ACT IN AERS: 2.0000 | INHALATION_SPRAY | Freq: Four times a day (QID) | RESPIRATORY_TRACT | 0 refills | Status: DC | PRN

## 2016-12-01 MED ORDER — KETOROLAC TROMETHAMINE 60 MG/2ML IM SOLN
60.0000 mg | Freq: Once | INTRAMUSCULAR | Status: AC
  Administered 2016-12-01: 60 mg via INTRAMUSCULAR

## 2016-12-01 NOTE — Progress Notes (Signed)
Subjective:    Patient ID: Christine Barrett, female    DOB: 01-Mar-1961, 56 y.o.   MRN: [id number]  HPI   56 year old female with history of HTN, hypertensive emergency presents with markedly elevated blood pressure, headache, sinus pressure and dizziness.  She has noted she has had headache with sinus pressure and sinusitis in last month... 10/21/2016 given antibiotics, but pressure did not resolve  In last month 11/04/2016 seen for sinusitis  from allergies .. prednisone helped sinus drainage. Headache severe has continued in frontal sinuses and occiput.  No fever. She has used excedrine and tylenol.  She has been using sinus rinse, flonase.  Changed to Xyzal.  Has not been using sudafed or afrin.   Given headache continued.. She has been following blood pressure..150-162/91-104 HR 68-76  Tightness in forehead, throbbing in occiput, mild nausea, sensititve to light and sound.  no longer having congestion or sneezing. Hx of  Migraine  worse with imitrex in past.   She is on losartan, coreg and isosorbide  ( has not had isosorbide since Friday) At last OV stopped HCTZ component.[name] Has been having some swelling..but HCTZ causes cramping.  Here today 158/80, higher measurement.[name] pt's BP Readings from Last 3 Encounters:  12/01/16 (!) 171/108  11/04/16 140/80  10/21/16 134/86    Review of Systems  Constitutional: Negative for fatigue and fever.  HENT: Negative for ear pain.   Eyes: Negative for pain.  Respiratory: Negative for chest tightness and shortness of breath.   Cardiovascular: Negative for chest pain, palpitations and leg swelling.  Gastrointestinal: Negative for abdominal pain.  Genitourinary: Negative for dysuria.  Neurological: Positive for headaches.       Objective:   Physical Exam  Constitutional: She is oriented to person, place, and time. Vital signs are normal. She appears well-developed and well-nourished. She is cooperative.  Non-toxic appearance. She does not  appear ill. No distress.  HENT:  Head: Normocephalic.  Right Ear: Hearing, tympanic membrane, external ear and ear canal normal. Tympanic membrane is not erythematous, not retracted and not bulging.  Left Ear: Hearing, tympanic membrane, external ear and ear canal normal. Tympanic membrane is not erythematous, not retracted and not bulging.  Nose: No mucosal edema or rhinorrhea. Right sinus exhibits no maxillary sinus tenderness and no frontal sinus tenderness. Left sinus exhibits no maxillary sinus tenderness and no frontal sinus tenderness.  Mouth/Throat: Uvula is midline, oropharynx is clear and moist and mucous membranes are normal.  Eyes: Conjunctivae, EOM and lids are normal. Pupils are equal, round, and reactive to light. Lids are everted and swept, no foreign bodies found.  Neck: Trachea normal and normal range of motion. Neck supple. Carotid bruit is not present. No thyroid mass and no thyromegaly present.  Cardiovascular: Normal rate, regular rhythm, S1 normal, S2 normal, normal heart sounds, intact distal pulses and normal pulses.  Exam reveals no gallop and no friction rub.   No murmur heard. Pulmonary/Chest: Effort normal and breath sounds normal. No tachypnea. No respiratory distress. She has no decreased breath sounds. She has no wheezes. She has no rhonchi. She has no rales.  Abdominal: Soft. Normal appearance and bowel sounds are normal. There is no tenderness.  Neurological: She is alert and oriented to person, place, and time. She has normal strength and normal reflexes. No cranial nerve deficit or sensory deficit. She exhibits normal muscle tone. She displays a negative Romberg sign. Coordination and gait normal. GCS eye subscore is 4. GCS verbal subscore is 5. GCS  motor subscore is 6.  Nml cerebellar exam   No papilledema  Skin: Skin is warm, dry and intact. No rash noted.  Psychiatric: She has a normal mood and affect. Her speech is normal and behavior is normal. Judgment and  thought content normal. Her mood appears not anxious. Cognition and memory are normal. Cognition and memory are not impaired. She does not exhibit a depressed mood. She exhibits normal recent memory and normal remote memory.          Assessment & Plan:

## 2016-12-01 NOTE — Assessment & Plan Note (Signed)
In office blood pressure is only slightly elevated.. Will likely return to nml when restarts imdur.   BP machine not accurate at home.. Get new monitor.  BP is not clearly the cause of HA.

## 2016-12-01 NOTE — Assessment & Plan Note (Signed)
More consistent with migraine then sinus headache as no other sinus symptoms or allergy symptoms.  No red flags.  Not clearly related to BP.  treat with toradol  IM x 1 in office today.

## 2016-12-01 NOTE — Assessment & Plan Note (Signed)
Well-controlled on current regimen. ?

## 2016-12-01 NOTE — Patient Instructions (Addendum)
Get new meter or have meter calibrated.  Refill medications and restart isosorbide.  Cal if headache not improving.  Keep appt with cardiology

## 2016-12-04 ENCOUNTER — Telehealth: Payer: Self-pay

## 2016-12-04 NOTE — Telephone Encounter (Signed)
Patient called said she was here earlier this week with a HA, she said the shot that was given to her during the visit did help with the pain but only for that day the pain came back and she would like to know what to do. Please advise thanks.

## 2016-12-04 NOTE — Telephone Encounter (Signed)
Ibuprofen

## 2016-12-04 NOTE — Telephone Encounter (Signed)
Yes ibuprofen 800 mg every 8 hours

## 2016-12-04 NOTE — Telephone Encounter (Signed)
This medication was an antiinflammatory medication... Have her try 800 mg every 8 hours as needed for headache. If not improving have her follow up in 2 weeks.

## 2016-12-04 NOTE — Telephone Encounter (Signed)
[place] notified as instructed by telephone.

## 2017-02-03 ENCOUNTER — Other Ambulatory Visit: Payer: Self-pay | Admitting: *Deleted

## 2017-02-03 DIAGNOSIS — R1310 Dysphagia, unspecified: Secondary | ICD-10-CM

## 2017-02-03 MED ORDER — LOSARTAN POTASSIUM 100 MG PO TABS
100.0000 mg | ORAL_TABLET | Freq: Every day | ORAL | 1 refills | Status: DC
Start: 2017-02-03 — End: 2017-11-11

## 2017-02-03 MED ORDER — ISOSORBIDE MONONITRATE ER 60 MG PO TB24: 120.0000 mg | ORAL_TABLET | Freq: Every day | ORAL | 1 refills | Status: DC

## 2017-02-03 MED ORDER — PANTOPRAZOLE SODIUM 40 MG PO TBEC: 40.0000 mg | DELAYED_RELEASE_TABLET | Freq: Two times a day (BID) | ORAL | 3 refills | Status: DC

## 2017-02-04 ENCOUNTER — Other Ambulatory Visit: Payer: Self-pay

## 2017-02-04 NOTE — Telephone Encounter (Signed)
error 

## 2017-02-15 ENCOUNTER — Telehealth: Payer: Self-pay | Admitting: Family Medicine

## 2017-02-15 DIAGNOSIS — E785 Hyperlipidemia, unspecified: Secondary | ICD-10-CM

## 2017-02-15 DIAGNOSIS — R7303 Prediabetes: Secondary | ICD-10-CM

## 2017-02-15 NOTE — Telephone Encounter (Signed)
-----   Message from Ellamae Sia sent at 02/15/2017  8:54 AM EDT ----- Regarding: Lab orders for Tuesday, 8.21.18 Lab orders for a f/u appt

## 2017-02-23 ENCOUNTER — Other Ambulatory Visit (INDEPENDENT_AMBULATORY_CARE_PROVIDER_SITE_OTHER): Payer: Self-pay

## 2017-02-23 DIAGNOSIS — E785 Hyperlipidemia, unspecified: Secondary | ICD-10-CM

## 2017-02-23 DIAGNOSIS — R7303 Prediabetes: Secondary | ICD-10-CM

## 2017-02-23 LAB — COMPREHENSIVE METABOLIC PANEL
ALT: 18 U/L (ref 0–35)
AST: 17 U/L (ref 0–37)
Albumin: 4.1 g/dL (ref 3.5–5.2)
Alkaline Phosphatase: 66 U/L (ref 39–117)
BUN: 10 mg/dL (ref 6–23)
CALCIUM: 9.2 mg/dL (ref 8.4–10.5)
CHLORIDE: 107 meq/L (ref 96–112)
CO2: 29 meq/L (ref 19–32)
CREATININE: 0.77 mg/dL (ref 0.40–1.20)
GFR: 99.96 mL/min (ref >60.00)
Glucose, Bld: 121 mg/dL — ABNORMAL HIGH (ref 70–99)
POTASSIUM: 3.8 meq/L (ref 3.5–5.1)
Sodium: 142 mEq/L (ref 135–145)
Total Bilirubin: 0.6 mg/dL (ref 0.2–1.2)
Total Protein: 7 g/dL (ref 6.0–8.3)

## 2017-02-23 LAB — LIPID PANEL
CHOL/HDL RATIO: 6
Cholesterol: 219 mg/dL — ABNORMAL HIGH (ref 0–200)
HDL: 37.4 mg/dL — AB (ref >39.00)
LDL Cholesterol: 149 mg/dL — ABNORMAL HIGH (ref 0–99)
NONHDL: 181.23
TRIGLYCERIDES: 161 mg/dL — AB (ref 0.0–149.0)
VLDL: 32.2 mg/dL (ref 0.0–40.0)

## 2017-02-23 LAB — HEMOGLOBIN A1C: Hgb A1c MFr Bld: 6.3 % (ref 4.6–6.5)

## 2017-03-02 ENCOUNTER — Ambulatory Visit: Payer: BLUE CROSS/BLUE SHIELD | Admitting: Family Medicine

## 2017-03-30 ENCOUNTER — Encounter (HOSPITAL_COMMUNITY): Payer: Self-pay | Admitting: Emergency Medicine

## 2017-03-30 ENCOUNTER — Ambulatory Visit (HOSPITAL_COMMUNITY)
Admission: EM | Admit: 2017-03-30 | Discharge: 2017-03-30 | Disposition: A | Discharge status: Home / Self Care | Payer: Self-pay | Attending: Family Medicine | Admitting: Family Medicine

## 2017-03-30 DIAGNOSIS — R05 Cough: Secondary | ICD-10-CM

## 2017-03-30 DIAGNOSIS — R059 Cough, unspecified: Secondary | ICD-10-CM

## 2017-03-30 MED ORDER — HYDROCODONE-HOMATROPINE 5-1.5 MG/5ML PO SYRP: 5.0000 mL | ORAL_SOLUTION | Freq: Four times a day (QID) | ORAL | 0 refills | Status: DC | PRN

## 2017-03-30 MED ORDER — AZITHROMYCIN 250 MG PO TABS: 250.0000 mg | ORAL_TABLET | Freq: Every day | ORAL | 0 refills | Status: DC

## 2017-03-30 NOTE — ED Triage Notes (Signed)
Pt here for URI sx with productive cough

## 2017-03-31 NOTE — ED Provider Notes (Signed)
[facility]   [id number] 03/30/17 Arrival Time: 1858  ASSESSMENT & PLAN:  1. Cough    History of frequent bronchitis.  Meds ordered this encounter  Medications  . azithromycin (ZITHROMAX) 250 MG tablet    Sig: Take 1 tablet (250 mg total) by mouth daily. Take first 2 tablets together, then 1 every day until finished.    Dispense:  6 tablet    Refill:  0  . HYDROcodone-homatropine (HYCODAN) 5-1.5 MG/5ML syrup    Sig: Take 5 mLs by mouth every 6 (six) hours as needed for cough.    Dispense:  90 mL    Refill:  0   OTC analgesics and symptom care as needed. Medication sedation precautions given. May f/u as needed.  Reviewed expectations re: course of current medical issues. Questions answered. Outlined signs and symptoms indicating need for more acute intervention. Patient verbalized understanding. After Visit Summary given.   SUBJECTIVE:  Christine Barrett is a 56 y.o. female who presents with complaint of nasal congestion, post-nasal drainage, and a persistent cough. Unsure when this started but now cough bothering her the most. Interfering with sleep. Reports frequent bronchitis that needs antibiotic treatment. OTC without relief. No SOB/wheezing. No fevers reported.   ROS: As per HPI.   OBJECTIVE:  Vitals:   03/30/17 1929  BP: (!) 184/88  Pulse: 93  Resp: 18  Temp: 99 F (37.2 C)  TempSrc: Oral  SpO2: 98%     General appearance: alert; no distress HEENT: nasal congestion; clear runny nose; throat irritation secondary to post-nasal drainage Neck: supple without LAD Lungs: clear to auscultation bilaterally; dry cough Skin: warm and dry Psychological: alert and cooperative; normal mood and affect  No results found.  Allergies  Allergen Reactions  . Penicillins Rash    Pt states she has taken since intial  reaction without recurrence.  Has patient had a PCN reaction causing immediate rash, facial/tongue/throat swelling, SOB or lightheadedness with  hypotension:  no Has patient had a PCN reaction causing severe rash involving mucus membranes or skin necrosis: no Has patient had a PCN reaction that required hospitalization no Has patient had a PCN reaction occurring within the last 10 years: no If all of the above answers are "NO", then may proceed with Cephalospori    Past Medical History:  Diagnosis Date  . Allergy    SEASONAL  . Anemia   . CAD S/P percutaneous coronary angioplasty 09/2013   a) Ostial AV G Cx - 2.5 mm Angiosculpt; mid LAD 40-60%; b) Myoview 07/2014: LOW RISK, small-severe fixed inferior defect c/w infarct w/o peri-infarct ischemia.  . Chronic bronchitis ([place])    "frequently; not q yr" (10/03/2013)  . Diverticulosis   . Essential hypertension    with prior Accelerated HTN  . Hiatal hernia   . Hx of non-ST elevation myocardial infarction (NSTEMI) 10/01/2013   Due to Accelerated HTN with existing CAD  . Hyperlipemia   . Migraine    "@ least once/month" (10/03/2013)  . OSA (obstructive sleep apnea) 06/10/2016  . Schatzki's ring   . Seasonal allergies   . Sinusitis    Social History   Social History  . Marital status: Married    Spouse name: Herbie Baltimore  . Number of children: 2  . Years of education: N/A   Occupational History  . Research scientist (life sciences)    Social History Main Topics  . Smoking status: Never Smoker  . Smokeless tobacco: Never Used  . Alcohol use No  . Drug use:  No  . Sexual activity: Yes    Birth control/ protection: Surgical   Other Topics Concern  . Not on file   Social History Narrative   She is a married mother of 2, grandmother 2. Her current marriage is than a pack years.    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 She never smoked and does not drink All. She does do time and balancing exercises but does not do routine of exercise.   She herself is adopted.   Family History  Problem Relation Age of Onset  .  Adopted: Yes  . Diabetes Mother   . Breast cancer Sister   . Hypertension Sister   . Colon cancer Neg Hx   . Heart attack Neg Hx   . Stroke Neg Hx            Vanessa Kick, MD 03/31/17 [phone]

## 2017-04-14 ENCOUNTER — Ambulatory Visit (HOSPITAL_COMMUNITY)
Admission: EM | Admit: 2017-04-14 | Discharge: 2017-04-14 | Disposition: A | Discharge status: Home / Self Care | Payer: Self-pay | Attending: Family Medicine | Admitting: Family Medicine

## 2017-04-14 ENCOUNTER — Encounter (HOSPITAL_COMMUNITY): Payer: Self-pay | Admitting: Emergency Medicine

## 2017-04-14 DIAGNOSIS — J0141 Acute recurrent pansinusitis: Secondary | ICD-10-CM

## 2017-04-14 MED ORDER — AMOXICILLIN-POT CLAVULANATE 875-125 MG PO TABS: 1.0000 | ORAL_TABLET | Freq: Two times a day (BID) | ORAL | 0 refills | Status: DC

## 2017-04-14 MED ORDER — HYDROCODONE-HOMATROPINE 5-1.5 MG/5ML PO SYRP: 5.0000 mL | ORAL_SOLUTION | Freq: Four times a day (QID) | ORAL | 0 refills | Status: DC | PRN

## 2017-04-14 NOTE — ED Notes (Signed)
Pt states she hasn't taken her bp meds today and her pressure is always high when she is sick

## 2017-04-14 NOTE — Discharge Instructions (Signed)
Start augmentin for sinus infection. Continue allergy medicines, nasal spray, nasal saline rinses. Keep hydrated, your urine should be clear to pale yellow in color. Tylenol/motrin for fever and pain. Monitor for any worsening of symptoms, chest pain, shortness of breath, wheezing, swelling of the throat, follow up for reevaluation. Follow up with ENT for further evaluation and treatment needed if symptoms worsens/does not resolve.

## 2017-04-14 NOTE — ED Provider Notes (Signed)
[facility]    CSN: [id number] Arrival date & time: 04/14/17  1741     History   Chief Complaint Chief Complaint  Patient presents with  . Cough    HPI Christine Barrett is a 56 y.o. female.   56 year old female with history of CAD, HTN, OSA, seasonal allergy, deviated septum repair comes in for 2 week history of cough and congestion. She was seen 2 weeks ago for similar symptoms and was treated with azithromycin for URI symptoms. Patient states symptoms improved slightly and worsened. Complaints of nasal congestion, rhinorrhea, itchy/watery eyes. Denies fever, chills, night sweats. Productive cough, worse at night. Had to use inhaler yesterday with shortness of breath that has since resolved. Denies chest pain. Patient states extensive history of seasonal allergies and sinus problems that has continued during season change despite deviated septum repair. Has been trying sudafed, antihistamines, flonase. Has also been using neti pots as well without improvement. Never smoker.        Past Medical History:  Diagnosis Date  . Allergy    SEASONAL  . Anemia   . CAD S/P percutaneous coronary angioplasty 09/2013   a) Ostial AV G Cx - 2.5 mm Angiosculpt; mid LAD 40-60%; b) Myoview 07/2014: LOW RISK, small-severe fixed inferior defect c/w infarct w/o peri-infarct ischemia.  . Chronic bronchitis ([place])    "frequently; not q yr" (10/03/2013)  . Diverticulosis   . Essential hypertension    with prior Accelerated HTN  . Hiatal hernia   . Hx of non-ST elevation myocardial infarction (NSTEMI) 10/01/2013   Due to Accelerated HTN with existing CAD  . Hyperlipemia   . Migraine    "@ least once/month" (10/03/2013)  . OSA (obstructive sleep apnea) 06/10/2016  . Schatzki's ring   . Seasonal allergies   . Sinusitis     Patient Active Problem List   Diagnosis Date Noted  . Acute headache 12/01/2016  . Sinusitis, acute 11/04/2016  . Left anterior knee pain 08/28/2016  . OSA (obstructive  sleep apnea) 06/10/2016  . Chest pain 06/09/2016  . Prediabetes 05/25/2016  . Crescendo angina ([place]) 08/05/2015  . Essential hypertension   . GERD (gastroesophageal reflux disease) 08/10/2014  . Menopausal syndrome 08/10/2014  . Accelerated hypertension with heart disease and without congestive heart failure 10/03/2013  . CAD S/P percutaneous coronary angioplasty - Ostial AVG Cx - Scoring balloon PTCA 10/03/2013  . Hyperlipidemia with target LDL less than 70 10/02/2013  . H/O non-ST elevation myocardial infarction (NSTEMI) 10/02/2013  . Normocytic anemia, not due to blood loss   . Hypertensive urgency 10/01/2013  . CARPAL TUNNEL SYNDROME 09/02/2007  . Allergic rhinitis 03/17/2007  . MIGRAINE, COMMON W/O INTRACTABLE MIGRAINE 03/16/2007    Past Surgical History:  Procedure Laterality Date  . ABDOMINAL HYSTERECTOMY  1994   "partial"  . CARDIAC CATHETERIZATION N/A 08/05/2015   Procedure: Left Heart Cath and Coronary Angiography;  Surgeon: [name], MD;  Location: [facility];  Service: Cardiovascular;  Laterality: N/A;  . CESAREAN SECTION  1989  . COLONOSCOPY    . CORONARY ANGIOPLASTY  10/03/2013   95% ostial AV G Cx - 2.5 mm AngioSculpt Balloon PTCA; mid LAD 40-60%  . LEFT HEART CATHETERIZATION WITH CORONARY ANGIOGRAM N/A 10/02/2013   Procedure: LEFT HEART CATHETERIZATION WITH CORONARY ANGIOGRAM;  Surgeon: [name], MD;  Location: [facility] CATH LAB;  Service: Cardiovascular;  Laterality: N/A;  . NASAL SEPTOPLASTY W/ TURBINOPLASTY  ~ 2007  . NM MYOVIEW LTD  08/22/2014    Low risk stress nuclear study with a small, severe, fixed defect in the distal inferior wall/apex suggestive of small prior infarct; no ischemia.  LV Wall Motion: NL LV Function; NL Wall Motion  . PERCUTANEOUS CORONARY STENT INTERVENTION (PCI-S) N/A 10/03/2013   cutting balloon angioplasty only no stent.   . TRANSTHORACIC ECHOCARDIOGRAM  10/02/2013   EF 55-60%; mild LVH, no RWMA,   . TUBAL LIGATION  1989     OB History    No data available       Home Medications    Prior to Admission medications   Medication Sig Start Date End Date Taking? Authorizing Provider  albuterol (PROVENTIL HFA;VENTOLIN HFA) 108 (90 Base) MCG/ACT inhaler Inhale 2 puffs into the lungs every 6 (six) hours as needed for wheezing or shortness of breath. 12/01/16   [name], [name], MD  aluminum hydroxide-magnesium carbonate (GAVISCON) 95-358 MG/15ML SUSP Take 15 mLs by mouth every 6 (six) hours as needed for heartburn.    [provider]  amoxicillin-clavulanate (AUGMENTIN) 875-125 MG tablet Take 1 tablet by mouth every 12 (twelve) hours. 04/14/17   Ok Edwards, PA-C  aspirin EC 81 MG EC tablet Take 1 tablet (81 mg total) by mouth daily. 10/04/13   Geradine Girt, DO  atorvastatin (LIPITOR) 40 MG tablet TAKE ONE TABLET BY MOUTH ONCE DAILY 10/12/16   [name], [name], MD  azithromycin (ZITHROMAX) 250 MG tablet Take 1 tablet (250 mg total) by mouth daily. Take first 2 tablets together, then 1 every day until finished. 03/30/17   Vanessa Kick, MD  carvedilol (COREG) 25 MG tablet Take 1 tablet (25 mg total) by mouth 2 (two) times daily with a meal. 12/01/16   [name], [name], MD  diclofenac sodium (VOLTAREN) 1 % GEL Apply 4 g topically 4 (four) times daily. 10/06/16   [name], [name], MD  fluticasone (FLONASE) 50 MCG/ACT nasal spray Place 2 sprays into both nostrils daily as needed for allergies or rhinitis.     [provider]  HYDROcodone-homatropine (HYCODAN) 5-1.5 MG/5ML syrup Take 5 mLs by mouth every 6 (six) hours as needed for cough. 04/14/17   Tasia Catchings, Amy V, PA-C  isosorbide mononitrate (IMDUR) 60 MG 24 hr tablet Take 2 tablets (120 mg total) by mouth daily. 02/03/17   [name], [name], MD  losartan (COZAAR) 100 MG tablet Take 1 tablet (100 mg total) by mouth daily. 02/03/17   Jinny Sanders, MD  Multiple Vitamin (MULTIVITAMIN WITH MINERALS) TABS tablet Take 1 tablet by mouth daily.    [provider]  Multiple  Vitamins-Minerals (EMERGEN-C IMMUNE) PACK Take 1 packet by mouth daily.    [provider]  nitroGLYCERIN (NITROSTAT) 0.4 MG SL tablet Place 1 tablet (0.4 mg total) under the tongue every 5 (five) minutes as needed for chest pain. May take up to 3 tablets. If still having chest pain, call 911 immediately. 06/02/16   Pyrtle, Lajuan Lines, MD  pantoprazole (PROTONIX) 40 MG tablet Take 1 tablet (40 mg total) by mouth 2 (two) times daily before a meal. 02/03/17   [name], [name], MD  ranitidine (ZANTAC) 150 MG tablet Take 1 tablet (150 mg total) by mouth daily. 11/04/16   Jinny Sanders, MD    Family History Family History  Problem Relation Age of Onset  . Adopted: Yes  . Diabetes Mother   . Breast cancer Sister   . Hypertension Sister   . Colon cancer Neg Hx   .  Heart attack Neg Hx   . Stroke Neg Hx     Social History Social History  Substance Use Topics  . Smoking status: Never Smoker  . Smokeless tobacco: Never Used  . Alcohol use No     Allergies   Penicillins   Review of Systems Review of Systems  Reason unable to perform ROS: See HPI as above.     Physical Exam Triage Vital Signs ED Triage Vitals  Enc Vitals Group     BP 04/14/17 1810 (!) 190/90     Pulse Rate 04/14/17 1810 97     Resp 04/14/17 1810 16     Temp 04/14/17 1810 98.4 F (36.9 C)     Temp Source 04/14/17 1810 Oral     SpO2 04/14/17 1810 100 %     Weight --      Height --      Head Circumference --      Peak Flow --      Pain Score 04/14/17 1811 0     Pain Loc --      Pain Edu? --      Excl. in [place]? --    No data found.   Updated Vital Signs BP (!) 190/90   Pulse 97   Temp 98.4 F (36.9 C) (Oral)   Resp 16   SpO2 100%   Physical Exam  Constitutional: She is oriented to person, place, and time. She appears well-developed and well-nourished. No distress.  HENT:  Head: Normocephalic and atraumatic.  Right Ear: External ear and ear canal normal. Tympanic membrane is erythematous. Tympanic  membrane is not bulging.  Left Ear: External ear and ear canal normal. Tympanic membrane is erythematous. Tympanic membrane is not bulging.  Nose: Mucosal edema present. Right sinus exhibits maxillary sinus tenderness and frontal sinus tenderness. Left sinus exhibits maxillary sinus tenderness and frontal sinus tenderness.  Mouth/Throat: Uvula is midline and mucous membranes are normal. Posterior oropharyngeal erythema present.  Eyes: Pupils are equal, round, and reactive to light. Conjunctivae are normal.  Neck: Normal range of motion. Neck supple.  Cardiovascular: Normal rate, regular rhythm and normal heart sounds.  Exam reveals no gallop and no friction rub.   No murmur heard. Pulmonary/Chest: Effort normal and breath sounds normal. She has no decreased breath sounds. She has no wheezes. She has no rhonchi. She has no rales.  Lymphadenopathy:    She has no cervical adenopathy.  Neurological: She is alert and oriented to person, place, and time.  Skin: Skin is warm and dry.  Psychiatric: She has a normal mood and affect. Her behavior is normal. Judgment normal.     UC Treatments / Results  Labs (all labs ordered are listed, but only abnormal results are displayed) Labs Reviewed - No data to display  EKG  EKG Interpretation None       Radiology No results found.  Procedures Procedures (including critical care time)  Medications Ordered in UC Medications - No data to display   Initial Impression / Assessment and Plan / UC Course  I have reviewed the triage vital signs and the nursing notes.  Pertinent labs & imaging results that were available during my care of the patient were reviewed by me and considered in my medical decision making (see chart for details).    Augmentin for sinusitis. Patient to continue symptomatic treatment. Information on ENT provided, patient to follow up if symptoms worsens, do not improve, reoccurs frequently. Return precautions given.    Final  Clinical Impressions(s) / UC Diagnoses   Final diagnoses:  Acute recurrent pansinusitis    New Prescriptions Discharge Medication List as of 04/14/2017  6:53 PM        Ok Edwards, PA-C 04/14/17 1904

## 2017-04-14 NOTE — ED Triage Notes (Signed)
Pt c/o cough, congestion x2 weeks, hx of allergies and sinus problems.

## 2017-09-20 ENCOUNTER — Ambulatory Visit: Payer: BLUE CROSS/BLUE SHIELD | Admitting: Family Medicine

## 2017-09-20 ENCOUNTER — Encounter: Payer: Self-pay | Admitting: Family Medicine

## 2017-09-20 VITALS — BP 144/88 | HR 81 | Temp 97.7°F | Wt 284.5 lb

## 2017-09-20 DIAGNOSIS — Z1231 Encounter for screening mammogram for malignant neoplasm of breast: Secondary | ICD-10-CM | POA: Diagnosis not present

## 2017-09-20 DIAGNOSIS — I2510 Atherosclerotic heart disease of native coronary artery without angina pectoris: Secondary | ICD-10-CM | POA: Diagnosis not present

## 2017-09-20 DIAGNOSIS — R252 Cramp and spasm: Secondary | ICD-10-CM

## 2017-09-20 DIAGNOSIS — S39012A Strain of muscle, fascia and tendon of lower back, initial encounter: Secondary | ICD-10-CM | POA: Diagnosis not present

## 2017-09-20 DIAGNOSIS — Z9861 Coronary angioplasty status: Secondary | ICD-10-CM

## 2017-09-20 DIAGNOSIS — R7303 Prediabetes: Secondary | ICD-10-CM

## 2017-09-20 DIAGNOSIS — I10 Essential (primary) hypertension: Secondary | ICD-10-CM | POA: Diagnosis not present

## 2017-09-20 DIAGNOSIS — R350 Frequency of micturition: Secondary | ICD-10-CM

## 2017-09-20 LAB — CBC WITH DIFFERENTIAL/PLATELET
BASOS PCT: 0.7 % (ref 0.0–3.0)
Basophils Absolute: 0 10*3/uL (ref 0.0–0.1)
EOS PCT: 5.2 % — AB (ref 0.0–5.0)
Eosinophils Absolute: 0.3 10*3/uL (ref 0.0–0.7)
HEMATOCRIT: 37.6 % (ref 36.0–46.0)
HEMOGLOBIN: 12.1 g/dL (ref 12.0–15.0)
LYMPHS PCT: 46.2 % — AB (ref 12.0–46.0)
Lymphs Abs: 2.8 10*3/uL (ref 0.7–4.0)
MCHC: 32.2 g/dL (ref 30.0–36.0)
MCV: 83.6 fl (ref 78.0–100.0)
Monocytes Absolute: 0.6 10*3/uL (ref 0.1–1.0)
Monocytes Relative: 9.6 % (ref 3.0–12.0)
NEUTROS ABS: 2.3 10*3/uL (ref 1.4–7.7)
Neutrophils Relative %: 38.3 % — ABNORMAL LOW (ref 43.0–77.0)
Platelets: 235 10*3/uL (ref 150.0–400.0)
RBC: 4.5 Mil/uL (ref 3.87–5.11)
RDW: 14.6 % (ref 11.5–15.5)
WBC: 6.1 10*3/uL (ref 4.0–10.5)

## 2017-09-20 LAB — HEMOGLOBIN A1C: HEMOGLOBIN A1C: 6.3 % (ref 4.6–6.5)

## 2017-09-20 LAB — POC URINALSYSI DIPSTICK (AUTOMATED)
BILIRUBIN UA: NEGATIVE
Glucose, UA: NEGATIVE
KETONES UA: NEGATIVE
LEUKOCYTES UA: NEGATIVE
NITRITE UA: NEGATIVE
PH UA: 6 (ref 5.0–8.0)
PROTEIN UA: NEGATIVE
Spec Grav, UA: 1.02 (ref 1.010–1.025)
Urobilinogen, UA: 0.2 E.U./dL

## 2017-09-20 LAB — COMPREHENSIVE METABOLIC PANEL
ALBUMIN: 4 g/dL (ref 3.5–5.2)
ALT: 21 U/L (ref 0–35)
AST: 18 U/L (ref 0–37)
Alkaline Phosphatase: 79 U/L (ref 39–117)
BUN: 13 mg/dL (ref 6–23)
CALCIUM: 9.5 mg/dL (ref 8.4–10.5)
CHLORIDE: 106 meq/L (ref 96–112)
CO2: 27 meq/L (ref 19–32)
Creatinine, Ser: 0.72 mg/dL (ref 0.40–1.20)
GFR: 107.79 mL/min (ref >60.00)
Glucose, Bld: 108 mg/dL — ABNORMAL HIGH (ref 70–99)
POTASSIUM: 4 meq/L (ref 3.5–5.1)
SODIUM: 140 meq/L (ref 135–145)
Total Bilirubin: 0.8 mg/dL (ref 0.2–1.2)
Total Protein: 7.6 g/dL (ref 6.0–8.3)

## 2017-09-20 LAB — GLUCOSE, POCT (MANUAL RESULT ENTRY): POC Glucose: 122 mg/dl — AB (ref 70–99)

## 2017-09-20 LAB — MAGNESIUM: Magnesium: 1.7 mg/dL (ref 1.5–2.5)

## 2017-09-20 NOTE — Patient Instructions (Addendum)
Good to meet you today.   For your back- continue to take ibuprofen sparingly- a couple of times a week. Can also try Extra strength Tylenol 2 tablets every 8 to 12 hours. I have given you some back exercise instructions, please try to do at least every other day.   Keep up the good work on your diet and try to add 15-20 minutes of walking daily.   Schedule an appointment for your complete physical with Dr. [name] and a follow up with your cardiologist.    Back Exercises If you have pain in your back, do these exercises 2-3 times each day or as told by your doctor. When the pain goes away, do the exercises once each day, but repeat the steps more times for each exercise (do more repetitions). If you do not have pain in your back, do these exercises once each day or as told by your doctor. Exercises Single Knee to Chest  Do these steps 3-5 times in a row for each leg: 1. Lie on your back on a firm bed or the floor with your legs stretched out. 2. Bring one knee to your chest. 3. Hold your knee to your chest by grabbing your knee or thigh. 4. Pull on your knee until you feel a gentle stretch in your lower back. 5. Keep doing the stretch for 10-30 seconds. 6. Slowly let go of your leg and straighten it.  Pelvic Tilt  Do these steps 5-10 times in a row: 1. Lie on your back on a firm bed or the floor with your legs stretched out. 2. Bend your knees so they point up to the ceiling. Your feet should be flat on the floor. 3. Tighten your lower belly (abdomen) muscles to press your lower back against the floor. This will make your tailbone point up to the ceiling instead of pointing down to your feet or the floor. 4. Stay in this position for 5-10 seconds while you gently tighten your muscles and breathe evenly.  Cat-Cow  Do these steps until your lower back bends more easily: 1. Get on your hands and knees on a firm surface. Keep your hands under your shoulders, and keep your knees under  your hips. You may put padding under your knees. 2. Let your head hang down, and make your tailbone point down to the floor so your lower back is round like the back of a cat. 3. Stay in this position for 5 seconds. 4. Slowly lift your head and make your tailbone point up to the ceiling so your back hangs low (sags) like the back of a cow. 5. Stay in this position for 5 seconds.  Press-Ups  Do these steps 5-10 times in a row: 1. Lie on your belly (face-down) on the floor. 2. Place your hands near your head, about shoulder-width apart. 3. While you keep your back relaxed and keep your hips on the floor, slowly straighten your arms to raise the top half of your body and lift your shoulders. Do not use your back muscles. To make yourself more comfortable, you may change where you place your hands. 4. Stay in this position for 5 seconds. 5. Slowly return to lying flat on the floor.  Bridges  Do these steps 10 times in a row: 1. Lie on your back on a firm surface. 2. Bend your knees so they point up to the ceiling. Your feet should be flat on the floor. 3. Tighten your butt muscles and  lift your butt off of the floor until your waist is almost as high as your knees. If you do not feel the muscles working in your butt and the back of your thighs, slide your feet 1-2 inches farther away from your butt. 4. Stay in this position for 3-5 seconds. 5. Slowly lower your butt to the floor, and let your butt muscles relax.  If this exercise is too easy, try doing it with your arms crossed over your chest. Belly Crunches  Do these steps 5-10 times in a row: 1. Lie on your back on a firm bed or the floor with your legs stretched out. 2. Bend your knees so they point up to the ceiling. Your feet should be flat on the floor. 3. Cross your arms over your chest. 4. Tip your chin a little bit toward your chest but do not bend your neck. 5. Tighten your belly muscles and slowly raise your chest just enough  to lift your shoulder blades a tiny bit off of the floor. 6. Slowly lower your chest and your head to the floor.  Back Lifts Do these steps 5-10 times in a row: 1. Lie on your belly (face-down) with your arms at your sides, and rest your forehead on the floor. 2. Tighten the muscles in your legs and your butt. 3. Slowly lift your chest off of the floor while you keep your hips on the floor. Keep the back of your head in line with the curve in your back. Look at the floor while you do this. 4. Stay in this position for 3-5 seconds. 5. Slowly lower your chest and your face to the floor.  Contact a doctor if:  Your back pain gets a lot worse when you do an exercise.  Your back pain does not lessen 2 hours after you exercise. If you have any of these problems, stop doing the exercises. Do not do them again unless your doctor says it is okay. Get help right away if:  You have sudden, very bad back pain. If this happens, stop doing the exercises. Do not do them again unless your doctor says it is okay. This information is not intended to replace advice given to you by your health care provider. Make sure you discuss any questions you have with your health care provider. Document Released: 07/18/2010 Document Revised: 11/21/2015 Document Reviewed: 08/09/2014 Elsevier Interactive Patient Education  2018 [place] refers to food and lifestyle choices that are based on the traditions of countries located on the [employer]. This way of eating has been shown to help prevent certain conditions and improve outcomes for people who have chronic diseases, like kidney disease and heart disease. What are tips for following this plan? Lifestyle  Cook and eat meals together with your family, when possible.  Drink enough fluid to keep your urine clear or pale yellow.  Be physically active every day. This includes: ? Aerobic exercise like running or  swimming. ? Leisure activities like gardening, walking, or housework.  Get 7-8 hours of sleep each night.  If recommended by your health care provider, drink red wine in moderation. This means 1 glass a day for nonpregnant women and 2 glasses a day for men. A glass of wine equals 5 oz (150 mL). Reading food labels  Check the serving size of packaged foods. For foods such as rice and pasta, the serving size refers to the amount of cooked product,  not dry.  Check the total fat in packaged foods. Avoid foods that have saturated fat or trans fats.  Check the ingredients list for added sugars, such as corn syrup. Shopping  At the grocery store, buy most of your food from the areas near the walls of the store. This includes: ? Fresh fruits and vegetables (produce). ? Grains, beans, nuts, and seeds. Some of these may be available in unpackaged forms or large amounts (in bulk). ? Fresh seafood. ? Poultry and eggs. ? Low-fat dairy products.  Buy whole ingredients instead of prepackaged foods.  Buy fresh fruits and vegetables in-season from local farmers markets.  Buy frozen fruits and vegetables in resealable bags.  If you do not have access to quality fresh seafood, buy precooked frozen shrimp or canned fish, such as tuna, salmon, or sardines.  Buy small amounts of raw or cooked vegetables, salads, or olives from the deli or salad bar at your store.  Stock your pantry so you always have certain foods on hand, such as olive oil, canned tuna, canned tomatoes, rice, pasta, and beans. Cooking  Cook foods with extra-virgin olive oil instead of using butter or other vegetable oils.  Have meat as a side dish, and have vegetables or grains as your main dish. This means having meat in small portions or adding small amounts of meat to foods like pasta or stew.  Use beans or vegetables instead of meat in common dishes like chili or lasagna.  Experiment with different cooking methods. Try  roasting or broiling vegetables instead of steaming or sauteing them.  Add frozen vegetables to soups, stews, pasta, or rice.  Add nuts or seeds for added healthy fat at each meal. You can add these to yogurt, salads, or vegetable dishes.  Marinate fish or vegetables using olive oil, lemon juice, garlic, and fresh herbs. Meal planning  Plan to eat 1 vegetarian meal one day each week. Try to work up to 2 vegetarian meals, if possible.  Eat seafood 2 or more times a week.  Have healthy snacks readily available, such as: ? Vegetable sticks with hummus. ? [place] yogurt. ? Fruit and nut trail mix.  Eat balanced meals throughout the week. This includes: ? Fruit: 2-3 servings a day ? Vegetables: 4-5 servings a day ? Low-fat dairy: 2 servings a day ? Fish, poultry, or lean meat: 1 serving a day ? Beans and legumes: 2 or more servings a week ? Nuts and seeds: 1-2 servings a day ? Whole grains: 6-8 servings a day ? Extra-virgin olive oil: 3-4 servings a day  Limit red meat and sweets to only a few servings a month What are my food choices?  Mediterranean diet ? Recommended ? Grains: Whole-grain pasta. Brown rice. Bulgar wheat. Polenta. Couscous. Whole-wheat bread. [place]. ? Vegetables: Artichokes. Beets. Broccoli. Cabbage. Carrots. Eggplant. Green beans. Chard. Kale. Spinach. Onions. Leeks. Peas. Squash. Tomatoes. Peppers. Radishes. ? Fruits: Apples. Apricots. Avocado. Berries. Bananas. Cherries. Dates. Figs. Grapes. Lemons. Melon. Oranges. Peaches. Plums. Pomegranate. ? Meats and other protein foods: Beans. Almonds. Sunflower seeds. Pine nuts. Peanuts. [place]. Salmon. Scallops. Shrimp. [place]. Tilapia. Clams. Oysters. Eggs. ? Dairy: Low-fat milk. Cheese. Greek yogurt. ? Beverages: Water. Red wine. Herbal tea. ? Fats and oils: Extra virgin olive oil. Avocado oil. Grape seed oil. ? Sweets and desserts: [place] yogurt with honey. Baked apples. Poached pears. Trail mix. ? Seasoning and  other foods: Basil. Cilantro. Coriander. Cumin. Mint. Parsley. Sage. Rosemary. Tarragon. Garlic. Oregano. Thyme. Pepper. Balsalmic vinegar.  Tahini. Hummus. Tomato sauce. Olives. Mushrooms. ? Limit these ? Grains: Prepackaged pasta or rice dishes. Prepackaged cereal with added sugar. ? Vegetables: Deep fried potatoes (french fries). ? Fruits: Fruit canned in syrup. ? Meats and other protein foods: Beef. Pork. Lamb. Poultry with skin. Hot dogs. [place] Salines. ? Dairy: Ice cream. Sour cream. Whole milk. ? Beverages: Juice. Sugar-sweetened soft drinks. Beer. Liquor and spirits. ? Fats and oils: Butter. Canola oil. Vegetable oil. Beef fat (tallow). Lard. ? Sweets and desserts: Cookies. Cakes. Pies. Candy. ? Seasoning and other foods: Mayonnaise. Premade sauces and marinades. ? The items listed may not be a complete list. Talk with your dietitian about what dietary choices are right for you. Summary  The Mediterranean diet includes both food and lifestyle choices.  Eat a variety of fresh fruits and vegetables, beans, nuts, seeds, and whole grains.  Limit the amount of red meat and sweets that you eat.  Talk with your health care provider about whether it is safe for you to drink red wine in moderation. This means 1 glass a day for nonpregnant women and 2 glasses a day for men. A glass of wine equals 5 oz (150 mL). This information is not intended to replace advice given to you by your health care provider. Make sure you discuss any questions you have with your health care provider. Document Released: 02/06/2016 Document Revised: 03/10/2016 Document Reviewed: 02/06/2016 Elsevier Interactive Patient Education  [employer].

## 2017-09-20 NOTE — Progress Notes (Signed)
Subjective:    Patient ID: Christine Barrett, female    DOB: 01/12/1961, 57 y.o.   MRN: [id number]  HPI This is a 57 yo female who presents today with intermittent low back pain x 2 weeks. Has also been having cramps in her LE (thighs and calves) for the last couple of days. . No overuse, no falls or trauma. No weakness or numbness or tingling. No bowel or bladder changes. Took ibuprofen 800 mg with some relief. Did not take every day. Pain getting better this week.   Obesity- Has been doing Herbal Life shakes, increased water.Has worked to cut out fast food. Has been motivated over the lat 2 months to lose weight.   Urinary Frequency-  Increased urinary frequency. Urinating through the night. Has recently increased water intake, drinks water mostly at night due to driving all day for work. No fever/chills/dysuria. Has history of prediabetes.   HTN/CAD- not checking blood pressures at home, not sure if her BP cuff is accurate. Has not followed up with cardiology in over a year.   Past Medical History:  Diagnosis Date  . Allergy    SEASONAL  . Anemia   . CAD S/P percutaneous coronary angioplasty 09/2013   a) Ostial AV G Cx - 2.5 mm Angiosculpt; mid LAD 40-60%; b) Myoview 07/2014: LOW RISK, small-severe fixed inferior defect c/w infarct w/o peri-infarct ischemia.  . Chronic bronchitis ([place])    "frequently; not q yr" (10/03/2013)  . Diverticulosis   . Essential hypertension    with prior Accelerated HTN  . Hiatal hernia   . Hx of non-ST elevation myocardial infarction (NSTEMI) 10/01/2013   Due to Accelerated HTN with existing CAD  . Hyperlipemia   . Migraine    "@ least once/month" (10/03/2013)  . OSA (obstructive sleep apnea) 06/10/2016  . Schatzki's ring   . Seasonal allergies   . Sinusitis    Past Surgical History:  Procedure Laterality Date  . ABDOMINAL HYSTERECTOMY  1994   "partial"  . CARDIAC CATHETERIZATION N/A 08/05/2015   Procedure: Left Heart Cath and Coronary Angiography;   Surgeon: [name], MD;  Location: [facility];  Service: Cardiovascular;  Laterality: N/A;  . CESAREAN SECTION  1989  . COLONOSCOPY    . CORONARY ANGIOPLASTY  10/03/2013   95% ostial AV G Cx - 2.5 mm AngioSculpt Balloon PTCA; mid LAD 40-60%  . LEFT HEART CATHETERIZATION WITH CORONARY ANGIOGRAM N/A 10/02/2013   Procedure: LEFT HEART CATHETERIZATION WITH CORONARY ANGIOGRAM;  Surgeon: [name], MD;  Location: [facility];  Service: Cardiovascular;  Laterality: N/A;  . NASAL SEPTOPLASTY W/ TURBINOPLASTY  ~ 2007  . NM MYOVIEW LTD  08/22/2014    Low risk stress nuclear study with a small, severe, fixed defect in the distal inferior wall/apex suggestive of small prior infarct; no ischemia.  LV Wall Motion: NL LV Function; NL Wall Motion  . PERCUTANEOUS CORONARY STENT INTERVENTION (PCI-S) N/A 10/03/2013   cutting balloon angioplasty only no stent.   . TRANSTHORACIC ECHOCARDIOGRAM  10/02/2013   EF 55-60%; mild LVH, no RWMA,   . TUBAL LIGATION  1989   Family History  Adopted: Yes  Problem Relation Age of Onset  . Diabetes Mother   . Breast cancer Sister   . Hypertension Sister   . Colon cancer Neg Hx   . Heart attack Neg Hx   . Stroke Neg Hx    Social History   Tobacco Use  . Smoking status: Never Smoker  .  Smokeless tobacco: Never Used  Substance Use Topics  . Alcohol use: No    Alcohol/week: 0.0 oz  . Drug use: No      Review of Systems Per HPI    Objective:   Physical Exam  Constitutional: She is oriented to person, place, and time. She appears well-developed and well-nourished. No distress.  HENT:  Head: Normocephalic and atraumatic.  Eyes: Conjunctivae are normal.  Cardiovascular: Normal rate, regular rhythm and normal heart sounds.  Pulmonary/Chest: Effort normal and breath sounds normal.  Abdominal: There is no CVA tenderness.  Musculoskeletal: Normal range of motion. She exhibits no edema.       Lumbar back: She exhibits tenderness and bony tenderness.  She exhibits normal range of motion.  Negative straight leg raise.   Neurological: She is alert and oriented to person, place, and time. She has normal reflexes.  Skin: Skin is warm and dry. She is not diaphoretic.  Psychiatric: She has a normal mood and affect. Her behavior is normal. Judgment and thought content normal.  Vitals reviewed.     BP (!) 144/88   Pulse 81   Temp 97.7 F (36.5 C) (Oral)   Wt 284 lb 8 oz (129 kg)   SpO2 97%   BMI 42.01 kg/m  Wt Readings from Last 3 Encounters:  09/20/17 284 lb 8 oz (129 kg)  12/01/16 282 lb 12 oz (128.3 kg)  11/04/16 284 lb (128.8 kg)   Results for orders placed or performed in visit on 09/20/17  POCT Urinalysis Dipstick (Automated)  Result Value Ref Range   Color, UA yellow    Clarity, UA clear    Glucose, UA neg    Bilirubin, UA neg    Ketones, UA neg    Spec Grav, UA 1.020 1.010 - 1.025   Blood, UA 1+    pH, UA 6.0 5.0 - 8.0   Protein, UA neg    Urobilinogen, UA 0.2 0.2 or 1.0 E.U./dL   Nitrite, UA neg    Leukocytes, UA Negative Negative  POCT glucose (manual entry)  Result Value Ref Range   POC Glucose 122 (A) 70 - 99 mg/dl       Assessment & Plan:  1. Strain of lumbar region, initial encounter - Provided written and verbal information regarding diagnosis and treatment. - Improving, provided written instructions for back exercises and encouraged 15-20 minutes of walking daily - RTC precautions reviewed  2. Leg cramps - Comprehensive metabolic panel - CBC with Differential - Magnesium  3. Prediabetes - POCT Urinalysis Dipstick (Automated) - POCT glucose (manual entry) - Comprehensive metabolic panel - Hemoglobin A1c  4. Urinary frequency - urinalysis does not show any leuk/nit, blood sugar 122 post prandial, symptom likely due to increased water intake. Encouraged her to drink bulk of water earlier in the day to minimize nocturia.  - POCT Urinalysis Dipstick (Automated) - POCT glucose (manual entry) -  Comprehensive metabolic panel  5. Essential hypertension - continue current meds - Comprehensive metabolic panel - CBC with Differential  6. CAD S/P percutaneous coronary angioplasty - Ostial AVG Cx - Scoring balloon PTCA - Comprehensive metabolic panel - CBC with Differential - Hemoglobin A1c  - follow up with PCP for CPE and cardiology   Clarene Reamer, FNP-BC  [facility]  09/20/2017 8:53 AM

## 2017-09-21 ENCOUNTER — Encounter: Payer: Self-pay | Admitting: Family Medicine

## 2017-09-30 ENCOUNTER — Encounter: Payer: Self-pay | Admitting: Family Medicine

## 2017-09-30 DIAGNOSIS — R922 Inconclusive mammogram: Secondary | ICD-10-CM | POA: Diagnosis not present

## 2017-10-07 DIAGNOSIS — J069 Acute upper respiratory infection, unspecified: Secondary | ICD-10-CM | POA: Diagnosis not present

## 2017-10-07 DIAGNOSIS — J302 Other seasonal allergic rhinitis: Secondary | ICD-10-CM | POA: Diagnosis not present

## 2017-11-11 ENCOUNTER — Ambulatory Visit (INDEPENDENT_AMBULATORY_CARE_PROVIDER_SITE_OTHER): Payer: BLUE CROSS/BLUE SHIELD | Admitting: Family Medicine

## 2017-11-11 ENCOUNTER — Encounter: Payer: Self-pay | Admitting: Family Medicine

## 2017-11-11 ENCOUNTER — Other Ambulatory Visit: Payer: Self-pay

## 2017-11-11 ENCOUNTER — Other Ambulatory Visit: Payer: Self-pay | Admitting: *Deleted

## 2017-11-11 VITALS — BP 160/90 | HR 81 | Temp 98.5°F | Ht 69.0 in | Wt 282.5 lb

## 2017-11-11 DIAGNOSIS — R1310 Dysphagia, unspecified: Secondary | ICD-10-CM | POA: Diagnosis not present

## 2017-11-11 DIAGNOSIS — K219 Gastro-esophageal reflux disease without esophagitis: Secondary | ICD-10-CM

## 2017-11-11 DIAGNOSIS — I10 Essential (primary) hypertension: Secondary | ICD-10-CM | POA: Diagnosis not present

## 2017-11-11 MED ORDER — ALBUTEROL SULFATE HFA 108 (90 BASE) MCG/ACT IN AERS: 2.0000 | INHALATION_SPRAY | Freq: Four times a day (QID) | RESPIRATORY_TRACT | 0 refills | Status: DC | PRN

## 2017-11-11 MED ORDER — PANTOPRAZOLE SODIUM 40 MG PO TBEC: 40.0000 mg | DELAYED_RELEASE_TABLET | Freq: Two times a day (BID) | ORAL | 0 refills | Status: DC

## 2017-11-11 MED ORDER — ISOSORBIDE MONONITRATE ER 60 MG PO TB24: 120.0000 mg | ORAL_TABLET | Freq: Every day | ORAL | 0 refills | Status: DC

## 2017-11-11 MED ORDER — ATORVASTATIN CALCIUM 40 MG PO TABS: 40.0000 mg | ORAL_TABLET | Freq: Every day | ORAL | 0 refills | Status: DC

## 2017-11-11 MED ORDER — LOSARTAN POTASSIUM 100 MG PO TABS: 100.0000 mg | ORAL_TABLET | Freq: Every day | ORAL | 0 refills | Status: DC

## 2017-11-11 MED ORDER — CARVEDILOL 25 MG PO TABS: 25.0000 mg | ORAL_TABLET | Freq: Two times a day (BID) | ORAL | 0 refills | Status: DC

## 2017-11-11 MED ORDER — RANITIDINE HCL 150 MG PO TABS: 150.0000 mg | ORAL_TABLET | Freq: Every day | ORAL | 0 refills | Status: DC

## 2017-11-11 NOTE — Assessment & Plan Note (Signed)
Refilled medication

## 2017-11-11 NOTE — Progress Notes (Signed)
   Subjective:    Patient ID: Christine Barrett, female    DOB: 11/09/1960, 57 y.o.   MRN: [id number]  HPI  57 year old female presents for med refills,  Has appt for CPX   She is out of all her meds. Hypertension:   poor control off meds.  BP Readings from Last 3 Encounters:  11/11/17 (!) 160/90  09/20/17 (!) 144/88  04/14/17 (!) 190/90   Headaches off the meds. Using medication without problems or lightheadedness:  none Chest pain with exertion: none Edema: none Short of breath: Average home BPs: Other issues:   GERD, dysphagia.. When on PPI and zantac.. Does well   Has increased water.. So more urination.   Review of Systems  Constitutional: Negative for fatigue and fever.  HENT: Negative for congestion.   Eyes: Negative for pain.  Respiratory: Negative for cough and shortness of breath.   Cardiovascular: Negative for chest pain, palpitations and leg swelling.  Gastrointestinal: Negative for abdominal pain.  Genitourinary: Negative for dysuria and vaginal bleeding.  Musculoskeletal: Negative for back pain.  Neurological: Negative for syncope, light-headedness and headaches.  Psychiatric/Behavioral: Negative for dysphoric mood.       Objective:   Physical Exam  Constitutional: Vital signs are normal. She appears well-developed and well-nourished. She is cooperative.  Non-toxic appearance. She does not appear ill. No distress.  HENT:  Head: Normocephalic.  Right Ear: Hearing, tympanic membrane, external ear and ear canal normal. Tympanic membrane is not erythematous, not retracted and not bulging.  Left Ear: Hearing, tympanic membrane, external ear and ear canal normal. Tympanic membrane is not erythematous, not retracted and not bulging.  Nose: No mucosal edema or rhinorrhea. Right sinus exhibits no maxillary sinus tenderness and no frontal sinus tenderness. Left sinus exhibits no maxillary sinus tenderness and no frontal sinus tenderness.  Mouth/Throat: Uvula is midline,  oropharynx is clear and moist and mucous membranes are normal.  Eyes: Pupils are equal, round, and reactive to light. Conjunctivae, EOM and lids are normal. Lids are everted and swept, no foreign bodies found.  Neck: Trachea normal and normal range of motion. Neck supple. Carotid bruit is not present. No thyroid mass and no thyromegaly present.  Cardiovascular: Normal rate, regular rhythm, S1 normal, S2 normal, normal heart sounds, intact distal pulses and normal pulses. Exam reveals no gallop and no friction rub.  No murmur heard. Pulmonary/Chest: Effort normal and breath sounds normal. No tachypnea. No respiratory distress. She has no decreased breath sounds. She has no wheezes. She has no rhonchi. She has no rales.  Abdominal: Soft. Normal appearance and bowel sounds are normal. There is no tenderness.  Neurological: She is alert.  Skin: Skin is warm, dry and intact. No rash noted.  Psychiatric: Her speech is normal and behavior is normal. Judgment and thought content normal. Her mood appears not anxious. Cognition and memory are normal. She does not exhibit a depressed mood.          Assessment & Plan:

## 2017-11-11 NOTE — Patient Instructions (Signed)
Keep upcoming OV as scheduled.

## 2017-11-12 ENCOUNTER — Encounter: Payer: BLUE CROSS/BLUE SHIELD | Admitting: Family Medicine

## 2017-12-16 DIAGNOSIS — I2510 Atherosclerotic heart disease of native coronary artery without angina pectoris: Secondary | ICD-10-CM | POA: Diagnosis not present

## 2017-12-16 DIAGNOSIS — Z7982 Long term (current) use of aspirin: Secondary | ICD-10-CM | POA: Diagnosis not present

## 2017-12-16 DIAGNOSIS — I10 Essential (primary) hypertension: Secondary | ICD-10-CM | POA: Diagnosis not present

## 2018-01-07 ENCOUNTER — Ambulatory Visit (INDEPENDENT_AMBULATORY_CARE_PROVIDER_SITE_OTHER): Payer: BLUE CROSS/BLUE SHIELD | Admitting: Family Medicine

## 2018-01-07 ENCOUNTER — Other Ambulatory Visit: Payer: Self-pay

## 2018-01-07 ENCOUNTER — Encounter: Payer: Self-pay | Admitting: Family Medicine

## 2018-01-07 VITALS — BP 140/90 | HR 78 | Temp 98.0°F | Ht 68.75 in | Wt 281.8 lb

## 2018-01-07 DIAGNOSIS — R1310 Dysphagia, unspecified: Secondary | ICD-10-CM

## 2018-01-07 DIAGNOSIS — Z0000 Encounter for general adult medical examination without abnormal findings: Secondary | ICD-10-CM

## 2018-01-07 DIAGNOSIS — I119 Hypertensive heart disease without heart failure: Secondary | ICD-10-CM

## 2018-01-07 DIAGNOSIS — R7303 Prediabetes: Secondary | ICD-10-CM | POA: Diagnosis not present

## 2018-01-07 DIAGNOSIS — Z23 Encounter for immunization: Secondary | ICD-10-CM | POA: Diagnosis not present

## 2018-01-07 DIAGNOSIS — E785 Hyperlipidemia, unspecified: Secondary | ICD-10-CM | POA: Diagnosis not present

## 2018-01-07 DIAGNOSIS — K219 Gastro-esophageal reflux disease without esophagitis: Secondary | ICD-10-CM | POA: Diagnosis not present

## 2018-01-07 DIAGNOSIS — I10 Essential (primary) hypertension: Secondary | ICD-10-CM

## 2018-01-07 DIAGNOSIS — Z1159 Encounter for screening for other viral diseases: Secondary | ICD-10-CM | POA: Diagnosis not present

## 2018-01-07 MED ORDER — RANITIDINE HCL 150 MG PO TABS: 150.0000 mg | ORAL_TABLET | Freq: Every day | ORAL | 3 refills | Status: DC

## 2018-01-07 MED ORDER — CARVEDILOL 25 MG PO TABS: 25.0000 mg | ORAL_TABLET | Freq: Two times a day (BID) | ORAL | 3 refills | Status: DC

## 2018-01-07 MED ORDER — PANTOPRAZOLE SODIUM 40 MG PO TBEC: 40.0000 mg | DELAYED_RELEASE_TABLET | Freq: Every day | ORAL | 3 refills | Status: DC

## 2018-01-07 MED ORDER — ISOSORBIDE MONONITRATE ER 60 MG PO TB24: 120.0000 mg | ORAL_TABLET | Freq: Every day | ORAL | 3 refills | Status: DC

## 2018-01-07 MED ORDER — LOSARTAN POTASSIUM 100 MG PO TABS: 100.0000 mg | ORAL_TABLET | Freq: Every day | ORAL | 3 refills | Status: DC

## 2018-01-07 MED ORDER — ATORVASTATIN CALCIUM 80 MG PO TABS: 80.0000 mg | ORAL_TABLET | Freq: Every day | ORAL | 3 refills | Status: DC

## 2018-01-07 NOTE — Assessment & Plan Note (Signed)
Tolerable control on current regimen.. Better control at home.

## 2018-01-07 NOTE — Progress Notes (Signed)
Subjective:    Patient ID: Christine Barrett, female    DOB: 07/12/1960, 57 y.o.   MRN: [id number]  HPI  The patient is here for annual wellness exam and preventative care.    Hypertension :  Borderline control on Coreg, losartan Using medication without problems or lightheadedness:  Chest pain with exertion: occ if pushing herself to hard.. More issues with costochondritis Edema:none Short of breath:none  Average home BPs: Other issues: CAD on isosorbide, ASA followed by Cards.   Reviewed Cards note Dr. Maylon Peppers in 11/2017 no changes made.  Prediabetes  Lab Results  Component Value Date   HGBA1C 6.3 09/20/2017    Elevated Cholesterol:  Due for re-eval. On atorvastatin.[name] Has been trying to take 80 mg .. Not sure if having muscle cramp Lab Results  Component Value Date   CHOL 219 (H) 02/23/2017   HDL 37.40 (L) 02/23/2017   LDLCALC 149 (H) 02/23/2017   LDLDIRECT 192.2 02/11/2009   TRIG 161.0 (H) 02/23/2017   CHOLHDL 6 02/23/2017  Using medications without problems: Muscle aches:  Diet compliance: moderate Exercise: active at work, minimal walking Other complaints:  Wt Readings from Last 3 Encounters:  01/07/18 281 lb 12 oz (127.8 kg)  11/11/17 282 lb 8 oz (128.1 kg)  09/20/17 284 lb 8 oz (129 kg)    Social History /Family History/Past Medical History reviewed in detail and updated in EMR if needed. Blood pressure 140/90, pulse 78, temperature 98 F (36.7 C), temperature source Oral, height 5' 8.75" (1.746 m), weight 281 lb 12 oz (127.8 kg).  Review of Systems  Constitutional: Negative for fatigue and fever.  HENT: Negative for congestion.   Eyes: Negative for pain.  Respiratory: Negative for cough and shortness of breath.   Cardiovascular: Negative for chest pain, palpitations and leg swelling.  Gastrointestinal: Negative for abdominal pain.  Genitourinary: Negative for dysuria and vaginal bleeding.  Musculoskeletal: Negative for back pain.  Neurological: Negative for  syncope, light-headedness and headaches.  Psychiatric/Behavioral: Negative for dysphoric mood.   intermittant nausea.. None now.. No clear trigger    Objective:   Physical Exam  Constitutional: Vital signs are normal. She appears well-developed and well-nourished. She is cooperative.  Non-toxic appearance. She does not appear ill. No distress.  HENT:  Head: Normocephalic.  Right Ear: Hearing, tympanic membrane, external ear and ear canal normal. Tympanic membrane is not erythematous, not retracted and not bulging.  Left Ear: Hearing, tympanic membrane, external ear and ear canal normal. Tympanic membrane is not erythematous, not retracted and not bulging.  Nose: Nose normal. No mucosal edema or rhinorrhea. Right sinus exhibits no maxillary sinus tenderness and no frontal sinus tenderness. Left sinus exhibits no maxillary sinus tenderness and no frontal sinus tenderness.  Mouth/Throat: Uvula is midline, oropharynx is clear and moist and mucous membranes are normal.  Eyes: Pupils are equal, round, and reactive to light. Conjunctivae, EOM and lids are normal. Lids are everted and swept, no foreign bodies found.  Neck: Trachea normal and normal range of motion. Neck supple. Carotid bruit is not present. No thyroid mass and no thyromegaly present.  Cardiovascular: Normal rate, regular rhythm, S1 normal, S2 normal, normal heart sounds, intact distal pulses and normal pulses. Exam reveals no gallop and no friction rub.  No murmur heard. Pulmonary/Chest: Effort normal and breath sounds normal. No tachypnea. No respiratory distress. She has no decreased breath sounds. She has no wheezes. She has no rhonchi. She has no rales.  Abdominal: Soft. Normal appearance and bowel sounds  are normal. She exhibits no distension, no fluid wave, no abdominal bruit and no mass. There is no hepatosplenomegaly. There is no tenderness. There is no rebound, no guarding and no CVA tenderness. No hernia.  Lymphadenopathy:     She has no cervical adenopathy.    She has no axillary adenopathy.  Neurological: She is alert. She has normal strength. No cranial nerve deficit or sensory deficit.  Skin: Skin is warm, dry and intact. No rash noted.  Psychiatric: Her speech is normal and behavior is normal. Judgment and thought content normal. Her mood appears not anxious. Cognition and memory are normal. She does not exhibit a depressed mood.          Assessment & Plan:  The patient's preventative maintenance and recommended screening tests for an annual wellness exam were reviewed in full today. Brought up to date unless services declined.  Counselled on the importance of diet, exercise, and its role in overall health and mortality. The patient's FH and SH was reviewed, including their home life, tobacco status, and drug and alcohol status.    Due for Hep C screening  Given TDap today  Last pap 2015 neg PAP and HPV neg... Partial hysterectomy, no family history of overian cancer.  mammogram 08/2017,  Sister with breast cancer.  colon: 5/ 2016 repeat in 10 years Dr. Hilarie Fredrickson

## 2018-01-07 NOTE — Assessment & Plan Note (Signed)
Due for re-eval. 

## 2018-01-07 NOTE — Assessment & Plan Note (Signed)
Ranitidine has helped most.. Can decrease to pantoprazole 40 mg ONCE daily.  Follow up if nausea recurs.

## 2018-01-07 NOTE — Patient Instructions (Addendum)
When able set up lab appt for cholesterol and HEP C in next several months.

## 2018-02-03 ENCOUNTER — Other Ambulatory Visit: Payer: Self-pay

## 2018-02-03 ENCOUNTER — Ambulatory Visit (HOSPITAL_COMMUNITY)
Admission: EM | Admit: 2018-02-03 | Discharge: 2018-02-03 | Disposition: A | Discharge status: Home / Self Care | Payer: BLUE CROSS/BLUE SHIELD | Attending: Family Medicine | Admitting: Family Medicine

## 2018-02-03 ENCOUNTER — Encounter (HOSPITAL_COMMUNITY): Payer: Self-pay | Admitting: Emergency Medicine

## 2018-02-03 ENCOUNTER — Emergency Department (HOSPITAL_COMMUNITY)
Admission: EM | Admit: 2018-02-03 | Discharge: 2018-02-03 | Disposition: A | Discharge status: Home / Self Care | Payer: Self-pay | Attending: Emergency Medicine | Admitting: Emergency Medicine

## 2018-02-03 ENCOUNTER — Emergency Department (HOSPITAL_COMMUNITY): Payer: Self-pay

## 2018-02-03 DIAGNOSIS — Z7982 Long term (current) use of aspirin: Secondary | ICD-10-CM | POA: Insufficient documentation

## 2018-02-03 DIAGNOSIS — R7303 Prediabetes: Secondary | ICD-10-CM | POA: Insufficient documentation

## 2018-02-03 DIAGNOSIS — M79602 Pain in left arm: Secondary | ICD-10-CM | POA: Insufficient documentation

## 2018-02-03 DIAGNOSIS — R42 Dizziness and giddiness: Secondary | ICD-10-CM

## 2018-02-03 DIAGNOSIS — I2510 Atherosclerotic heart disease of native coronary artery without angina pectoris: Secondary | ICD-10-CM | POA: Insufficient documentation

## 2018-02-03 DIAGNOSIS — M542 Cervicalgia: Secondary | ICD-10-CM | POA: Insufficient documentation

## 2018-02-03 DIAGNOSIS — Z79899 Other long term (current) drug therapy: Secondary | ICD-10-CM | POA: Insufficient documentation

## 2018-02-03 DIAGNOSIS — I10 Essential (primary) hypertension: Secondary | ICD-10-CM | POA: Insufficient documentation

## 2018-02-03 DIAGNOSIS — I160 Hypertensive urgency: Secondary | ICD-10-CM

## 2018-02-03 DIAGNOSIS — R079 Chest pain, unspecified: Secondary | ICD-10-CM | POA: Insufficient documentation

## 2018-02-03 DIAGNOSIS — R0789 Other chest pain: Secondary | ICD-10-CM

## 2018-02-03 LAB — I-STAT TROPONIN, ED
TROPONIN I, POC: 0 ng/mL (ref 0.00–0.08)
Troponin i, poc: 0 ng/mL (ref 0.00–0.08)

## 2018-02-03 LAB — BASIC METABOLIC PANEL
Anion gap: 10 (ref 5–15)
BUN: 11 mg/dL (ref 6–20)
CALCIUM: 9.5 mg/dL (ref 8.9–10.3)
CO2: 25 mmol/L (ref 22–32)
CREATININE: 0.76 mg/dL (ref 0.44–1.00)
Chloride: 106 mmol/L (ref 98–111)
Glucose, Bld: 108 mg/dL — ABNORMAL HIGH (ref 70–99)
Potassium: 3.8 mmol/L (ref 3.5–5.1)
SODIUM: 141 mmol/L (ref 135–145)

## 2018-02-03 LAB — I-STAT BETA HCG BLOOD, ED (MC, WL, AP ONLY)

## 2018-02-03 LAB — CBC
HCT: 40.2 % (ref 36.0–46.0)
Hemoglobin: 12.3 g/dL (ref 12.0–15.0)
MCH: 26.5 pg (ref 26.0–34.0)
MCHC: 30.6 g/dL (ref 30.0–36.0)
MCV: 86.5 fL (ref 78.0–100.0)
PLATELETS: 274 10*3/uL (ref 150–400)
RBC: 4.65 MIL/uL (ref 3.87–5.11)
RDW: 14.1 % (ref 11.5–15.5)
WBC: 6.9 10*3/uL (ref 4.0–10.5)

## 2018-02-03 MED ORDER — METHOCARBAMOL 500 MG PO TABS: 500.0000 mg | ORAL_TABLET | Freq: Two times a day (BID) | ORAL | 0 refills | Status: DC

## 2018-02-03 MED ORDER — IBUPROFEN 800 MG PO TABS
800.0000 mg | ORAL_TABLET | Freq: Once | ORAL | Status: AC
  Administered 2018-02-03: 800 mg via ORAL
  Filled 2018-02-03: qty 1

## 2018-02-03 MED ORDER — METHOCARBAMOL 500 MG PO TABS
500.0000 mg | ORAL_TABLET | Freq: Once | ORAL | Status: AC
  Administered 2018-02-03: 500 mg via ORAL
  Filled 2018-02-03: qty 1

## 2018-02-03 MED ORDER — IBUPROFEN 800 MG PO TABS: 800.0000 mg | ORAL_TABLET | Freq: Four times a day (QID) | ORAL | 0 refills | Status: DC | PRN

## 2018-02-03 NOTE — ED Notes (Signed)
Pt currently has no chest pain

## 2018-02-03 NOTE — ED Notes (Signed)
Patient ambulatory to bathroom with steady gait at this time 

## 2018-02-03 NOTE — ED Provider Notes (Signed)
[facility] EMERGENCY DEPARTMENT Provider Note   CSN: [id number] Arrival date & time: 02/03/18  1115     History   Chief Complaint Chief Complaint  Patient presents with  . Chest Pain  . Dizziness  . Arm Pain    HPI Christine Barrett is a 57 y.o. female who presents with chest pain and L sided neck pain. PMH significant for CAD, hx of NSTEMI in 2015, HTN, HLD, migraines.  The patient states that she started to have chest pain yesterday.  It starts in the central part of her chest and radiates to the left and down her arm.  It feels like a brick on her chest.  It feels similar to when she had her heart attack.  She reports associated lightheadedness/dizziness and nausea. She states she has had several evaluations for this pain because it is recurrent and has been told multiple times is not her heart and that it is costocondritis. Last admission for a r/o was in 2017. She took 2 Tylenol, 2 Aleve, applied heat, which did not provide any relief.  Today she woke up with left-sided neck pain radiating to the back of her head.  She states that this is not consistent with her migraines and hasn't really had this before.  She denies any neck trauma or muscle spasms in the neck.  She went to urgent care who referred her to the ED because of her history.  She states she has not taken her blood pressure medicines today because she has been waiting in the emergency department.  No fever, chills, cough, shortness of breath, abdominal pain, vomiting, leg swelling. Echo in 2015 showed EF of 55-60%. Cath in 2017 showed 50% stenosis of prox cx to mid cx which was tx previously with angioplasty, prox LAD to mid LAD 60% stenosed, and Ost 2nd diag to 2nd diag lesion 80% stenosed. Cardiologist is in [place] She last saw them in June. They noted that she has small vessel disease which is not optimal for revascularization.  HPI  Past Medical History:  Diagnosis Date  . Allergy    SEASONAL  . Anemia   .  CAD S/P percutaneous coronary angioplasty 09/2013   a) Ostial AV G Cx - 2.5 mm Angiosculpt; mid LAD 40-60%; b) Myoview 07/2014: LOW RISK, small-severe fixed inferior defect c/w infarct w/o peri-infarct ischemia.  . Chronic bronchitis ([place])    "frequently; not q yr" (10/03/2013)  . Diverticulosis   . Essential hypertension    with prior Accelerated HTN  . Hiatal hernia   . Hx of non-ST elevation myocardial infarction (NSTEMI) 10/01/2013   Due to Accelerated HTN with existing CAD  . Hyperlipemia   . Migraine    "@ least once/month" (10/03/2013)  . OSA (obstructive sleep apnea) 06/10/2016  . Schatzki's ring   . Seasonal allergies   . Sinusitis     Patient Active Problem List   Diagnosis Date Noted  . Sinusitis, acute 11/04/2016  . Left anterior knee pain 08/28/2016  . OSA (obstructive sleep apnea) 06/10/2016  . Chest pain 06/09/2016  . Prediabetes 05/25/2016  . Crescendo angina ([place]) 08/05/2015  . Essential hypertension   . GERD (gastroesophageal reflux disease) 08/10/2014  . Menopausal syndrome 08/10/2014  . Accelerated hypertension with heart disease and without congestive heart failure 10/03/2013  . CAD S/P percutaneous coronary angioplasty - Ostial AVG Cx - Scoring balloon PTCA 10/03/2013  . Hyperlipidemia with target LDL less than 70 10/02/2013  . H/O non-ST elevation myocardial  infarction (NSTEMI) 10/02/2013  . Normocytic anemia, not due to blood loss   . Hypertensive urgency 10/01/2013  . CARPAL TUNNEL SYNDROME 09/02/2007  . Allergic rhinitis 03/17/2007  . MIGRAINE, COMMON W/O INTRACTABLE MIGRAINE 03/16/2007    Past Surgical History:  Procedure Laterality Date  . ABDOMINAL HYSTERECTOMY  1994   "partial"  . CARDIAC CATHETERIZATION N/A 08/05/2015   Procedure: Left Heart Cath and Coronary Angiography;  Surgeon: [name], MD;  Location: [facility];  Service: Cardiovascular;  Laterality: N/A;  . CESAREAN SECTION  1989  . COLONOSCOPY    . CORONARY ANGIOPLASTY   10/03/2013   95% ostial AV G Cx - 2.5 mm AngioSculpt Balloon PTCA; mid LAD 40-60%  . LEFT HEART CATHETERIZATION WITH CORONARY ANGIOGRAM N/A 10/02/2013   Procedure: LEFT HEART CATHETERIZATION WITH CORONARY ANGIOGRAM;  Surgeon: [name], MD;  Location: [facility] CATH LAB;  Service: Cardiovascular;  Laterality: N/A;  . NASAL SEPTOPLASTY W/ TURBINOPLASTY  ~ 2007  . NM MYOVIEW LTD  08/22/2014    Low risk stress nuclear study with a small, severe, fixed defect in the distal inferior wall/apex suggestive of small prior infarct; no ischemia.  LV Wall Motion: NL LV Function; NL Wall Motion  . PERCUTANEOUS CORONARY STENT INTERVENTION (PCI-S) N/A 10/03/2013   cutting balloon angioplasty only no stent.   . TRANSTHORACIC ECHOCARDIOGRAM  10/02/2013   EF 55-60%; mild LVH, no RWMA,   . TUBAL LIGATION  1989     OB History   None      Home Medications    Prior to Admission medications   Medication Sig Start Date End Date Taking? Authorizing Provider  albuterol (PROVENTIL HFA;VENTOLIN HFA) 108 (90 Base) MCG/ACT inhaler Inhale 2 puffs into the lungs every 6 (six) hours as needed for wheezing or shortness of breath. 11/11/17   [name], [name], MD  aspirin EC 81 MG EC tablet Take 1 tablet (81 mg total) by mouth daily. 10/04/13   Geradine Girt, DO  atorvastatin (LIPITOR) 80 MG tablet Take 1 tablet (80 mg total) by mouth daily. 01/07/18   [name], [name], MD  carvedilol (COREG) 25 MG tablet Take 1 tablet (25 mg total) by mouth 2 (two) times daily with a meal. 01/07/18   [name], [name], MD  isosorbide mononitrate (IMDUR) 60 MG 24 hr tablet Take 2 tablets (120 mg total) by mouth daily. 01/07/18   [name], [name], MD  losartan (COZAAR) 100 MG tablet Take 1 tablet (100 mg total) by mouth daily. 01/07/18   Jinny Sanders, MD  Multiple Vitamin (MULTIVITAMIN WITH MINERALS) TABS tablet Take 1 tablet by mouth daily.    [provider]  Multiple Vitamins-Minerals (EMERGEN-C IMMUNE) PACK Take 1 packet by mouth daily.     [provider]  nitroGLYCERIN (NITROSTAT) 0.4 MG SL tablet Place 1 tablet (0.4 mg total) under the tongue every 5 (five) minutes as needed for chest pain. May take up to 3 tablets. If still having chest pain, call 911 immediately. 06/02/16   Pyrtle, Lajuan Lines, MD  pantoprazole (PROTONIX) 40 MG tablet Take 1 tablet (40 mg total) by mouth daily. 01/07/18   [name], [name], MD  ranitidine (ZANTAC) 150 MG tablet Take 1 tablet (150 mg total) by mouth daily. 01/07/18   Jinny Sanders, MD    Family History Family History  Adopted: Yes  Problem Relation Age of Onset  . Diabetes Mother   . Breast cancer Sister   . Hypertension Sister   .  Colon cancer Neg Hx   . Heart attack Neg Hx   . Stroke Neg Hx     Social History Social History   Tobacco Use  . Smoking status: Never Smoker  . Smokeless tobacco: Never Used  Substance Use Topics  . Alcohol use: No    Alcohol/week: 0.0 standard drinks  . Drug use: No     Allergies   Penicillins   Review of Systems Review of Systems  Constitutional: Negative for fever.  Respiratory: Negative for cough and shortness of breath.   Cardiovascular: Positive for chest pain.  Gastrointestinal: Positive for nausea. Negative for abdominal pain and vomiting.  Musculoskeletal: Positive for neck pain.  Neurological: Positive for dizziness, light-headedness and headaches. Negative for syncope and weakness.  All other systems reviewed and are negative.    Physical Exam Updated Vital Signs BP (!) 187/101 (BP Location: Right Arm)   Pulse 62   Temp 98.9 F (37.2 C) (Oral)   Resp 16   SpO2 100%   Physical Exam  Constitutional: She is oriented to person, place, and time. She appears well-developed and well-nourished. No distress.  HENT:  Head: Normocephalic and atraumatic.  Eyes: Pupils are equal, round, and reactive to light. Conjunctivae are normal. Right eye exhibits no discharge. Left eye exhibits no discharge. No scleral icterus.  Neck: Normal  range of motion.  Left cervical paraspinal muscle tenderness and L trapezius tenderness. Pain is elicited with arm movement. 5/5 strength in upper extremities. N/V intact  Cardiovascular: Normal rate and regular rhythm.  Pulmonary/Chest: Effort normal and breath sounds normal. No respiratory distress.  Abdominal: Soft. Bowel sounds are normal. She exhibits no distension. There is no tenderness.  Neurological: She is alert and oriented to person, place, and time.  Skin: Skin is warm and dry.  Psychiatric: She has a normal mood and affect. Her behavior is normal.  Nursing note and vitals reviewed.    ED Treatments / Results  Labs (all labs ordered are listed, but only abnormal results are displayed) Labs Reviewed  BASIC METABOLIC PANEL - Abnormal; Notable for the following components:      Result Value   Glucose, Bld 108 (*)    All other components within normal limits  CBC  I-STAT TROPONIN, ED  I-STAT BETA HCG BLOOD, ED ([facility], [facility], [facility] ONLY)  I-STAT TROPONIN, ED    EKG None  Radiology Dg Chest 2 View  Result Date: 02/03/2018 CLINICAL DATA:  Hypertension, chest spasms, chest pressure. Dizziness and nausea. History of NSTEMI, coronary angioplasty. EXAM: CHEST - 2 VIEW COMPARISON:  Chest x-ray of June 09, 2016 FINDINGS: The lungs are well-expanded. The interstitial markings are coarse and slightly more conspicuous than in the past. The heart is top-normal in size. The pulmonary vascularity is normal. There is calcification in the wall of the aortic arch. The trachea is midline. The bony thorax exhibits no acute abnormality. IMPRESSION: Mild chronic bronchitic changes. No alveolar pneumonia nor pulmonary edema. No acute cardiopulmonary abnormality. Thoracic aortic atherosclerosis. Electronically Signed   By: David  Martinique M.D.   On: 02/03/2018 12:11    Procedures Procedures (including critical care time)  Medications Ordered in ED Medications  ibuprofen (ADVIL,MOTRIN) tablet 800 mg  (800 mg Oral Given 02/03/18 1552)  methocarbamol (ROBAXIN) tablet 500 mg (500 mg Oral Given 02/03/18 1552)     Initial Impression / Assessment and Plan / ED Course  I have reviewed the triage vital signs and the nursing notes.  Pertinent labs & imaging results that  were available during my care of the patient were reviewed by me and considered in my medical decision making (see chart for details).  57 year old female presents with acute on chronic chest pain and left-sided neck pain for the past couple of days.  She has had multiple evaluations for her chest pain in the past but the neck pain is new.  It is likely musculoskeletal.  She has not had any trauma and there is reproducible tenderness on exam with palpation and left arm movement.  Blood pressures are very elevated with systolics in the 671I and diastolic in 458.  She has not taken her medicine today.  After she was given her home dose this improved to 160/86.  Her EKG is normal sinus rhythm.  Her chest x-ray is negative.  Her blood work is unremarkable.  Initial and second troponin are normal.  Her chest pain today is consistent with prior episodes.  She reports improvement after she was given ibuprofen and Robaxin for muscle pain.  We will give a prescription for the same and have her follow-up with her doctor.  Final Clinical Impressions(s) / ED Diagnoses   Final diagnoses:  Chest pain, unspecified type  Neck pain on left side    ED Discharge Orders    None       Recardo Evangelist, PA-C 02/03/18 1746    Fredia Sorrow, MD 02/05/18 [phone]

## 2018-02-03 NOTE — ED Triage Notes (Signed)
Pt states two days ago her neck started hurting, difficulty turning her neck. Pt also c/o dizziness.

## 2018-02-03 NOTE — Discharge Instructions (Signed)
Please go to ER for further evaluation of your chest pressure, htn, neck pain and dizziness

## 2018-02-03 NOTE — ED Provider Notes (Signed)
[facility]    CSN: [id number] Arrival date & time: 02/03/18  1030     History   Chief Complaint Chief Complaint  Patient presents with  . Neck Pain  . Dizziness    HPI Christine Barrett is a 57 y.o. female.   Christine Barrett presents with complaints of left neck pain which radiates to left shoulder, dizziness, nausea and left chest pressure. Has had "chest spasms" for the past few days, states she has been told she has costochondritis. But since yesterday developed more chest pressure. Today started feeling dizzy and nausea. More dizzy and nauseated with movement. Shortness of breath when she feels the spasms. Took tylenol and aleve which did not help with pain. Has not taken her BP medications today. No known injury to neck. History of CAD with NSTEMI as well as multiple cardiac caths. She takes coreg, amlodipine as well as imdur. Follows with cardiology, last seen 12/05/2017. Noted to have elevated BP then, had not yet taken her medications as well, bp was 177/87. bp 140/90 at last PCP appointment 1 month ago.     ROS per HPI.      Past Medical History:  Diagnosis Date  . Allergy    SEASONAL  . Anemia   . CAD S/P percutaneous coronary angioplasty 09/2013   a) Ostial AV G Cx - 2.5 mm Angiosculpt; mid LAD 40-60%; b) Myoview 07/2014: LOW RISK, small-severe fixed inferior defect c/w infarct w/o peri-infarct ischemia.  . Chronic bronchitis ([place])    "frequently; not q yr" (10/03/2013)  . Diverticulosis   . Essential hypertension    with prior Accelerated HTN  . Hiatal hernia   . Hx of non-ST elevation myocardial infarction (NSTEMI) 10/01/2013   Due to Accelerated HTN with existing CAD  . Hyperlipemia   . Migraine    "@ least once/month" (10/03/2013)  . OSA (obstructive sleep apnea) 06/10/2016  . Schatzki's ring   . Seasonal allergies   . Sinusitis     Patient Active Problem List   Diagnosis Date Noted  . Sinusitis, acute 11/04/2016  . Left anterior knee pain 08/28/2016  .  OSA (obstructive sleep apnea) 06/10/2016  . Chest pain 06/09/2016  . Prediabetes 05/25/2016  . Crescendo angina ([place]) 08/05/2015  . Essential hypertension   . GERD (gastroesophageal reflux disease) 08/10/2014  . Menopausal syndrome 08/10/2014  . Accelerated hypertension with heart disease and without congestive heart failure 10/03/2013  . CAD S/P percutaneous coronary angioplasty - Ostial AVG Cx - Scoring balloon PTCA 10/03/2013  . Hyperlipidemia with target LDL less than 70 10/02/2013  . H/O non-ST elevation myocardial infarction (NSTEMI) 10/02/2013  . Normocytic anemia, not due to blood loss   . Hypertensive urgency 10/01/2013  . CARPAL TUNNEL SYNDROME 09/02/2007  . Allergic rhinitis 03/17/2007  . MIGRAINE, COMMON W/O INTRACTABLE MIGRAINE 03/16/2007    Past Surgical History:  Procedure Laterality Date  . ABDOMINAL HYSTERECTOMY  1994   "partial"  . CARDIAC CATHETERIZATION N/A 08/05/2015   Procedure: Left Heart Cath and Coronary Angiography;  Surgeon: [name], MD;  Location: [facility] LAB;  Service: Cardiovascular;  Laterality: N/A;  . CESAREAN SECTION  1989  . COLONOSCOPY    . CORONARY ANGIOPLASTY  10/03/2013   95% ostial AV G Cx - 2.5 mm AngioSculpt Balloon PTCA; mid LAD 40-60%  . LEFT HEART CATHETERIZATION WITH CORONARY ANGIOGRAM N/A 10/02/2013   Procedure: LEFT HEART CATHETERIZATION WITH CORONARY ANGIOGRAM;  Surgeon: [name], MD;  Location: [facility] CATH LAB;  Service: Cardiovascular;  Laterality: N/A;  . NASAL SEPTOPLASTY W/ TURBINOPLASTY  ~ 2007  . NM MYOVIEW LTD  08/22/2014    Low risk stress nuclear study with a small, severe, fixed defect in the distal inferior wall/apex suggestive of small prior infarct; no ischemia.  LV Wall Motion: NL LV Function; NL Wall Motion  . PERCUTANEOUS CORONARY STENT INTERVENTION (PCI-S) N/A 10/03/2013   cutting balloon angioplasty only no stent.   . TRANSTHORACIC ECHOCARDIOGRAM  10/02/2013   EF 55-60%; mild LVH, no RWMA,   . TUBAL  LIGATION  1989    OB History   None      Home Medications    Prior to Admission medications   Medication Sig Start Date End Date Taking? Authorizing Provider  albuterol (PROVENTIL HFA;VENTOLIN HFA) 108 (90 Base) MCG/ACT inhaler Inhale 2 puffs into the lungs every 6 (six) hours as needed for wheezing or shortness of breath. 11/11/17   [name], [name], MD  aspirin EC 81 MG EC tablet Take 1 tablet (81 mg total) by mouth daily. 10/04/13   Geradine Girt, DO  atorvastatin (LIPITOR) 80 MG tablet Take 1 tablet (80 mg total) by mouth daily. 01/07/18   [name], [name], MD  carvedilol (COREG) 25 MG tablet Take 1 tablet (25 mg total) by mouth 2 (two) times daily with a meal. 01/07/18   [name], [name], MD  isosorbide mononitrate (IMDUR) 60 MG 24 hr tablet Take 2 tablets (120 mg total) by mouth daily. 01/07/18   [name], [name], MD  losartan (COZAAR) 100 MG tablet Take 1 tablet (100 mg total) by mouth daily. 01/07/18   Jinny Sanders, MD  Multiple Vitamin (MULTIVITAMIN WITH MINERALS) TABS tablet Take 1 tablet by mouth daily.    [provider]  Multiple Vitamins-Minerals (EMERGEN-C IMMUNE) PACK Take 1 packet by mouth daily.    [provider]  nitroGLYCERIN (NITROSTAT) 0.4 MG SL tablet Place 1 tablet (0.4 mg total) under the tongue every 5 (five) minutes as needed for chest pain. May take up to 3 tablets. If still having chest pain, call 911 immediately. 06/02/16   Pyrtle, Lajuan Lines, MD  pantoprazole (PROTONIX) 40 MG tablet Take 1 tablet (40 mg total) by mouth daily. 01/07/18   [name], [name], MD  ranitidine (ZANTAC) 150 MG tablet Take 1 tablet (150 mg total) by mouth daily. 01/07/18   Jinny Sanders, MD    Family History Family History  Adopted: Yes  Problem Relation Age of Onset  . Diabetes Mother   . Breast cancer Sister   . Hypertension Sister   . Colon cancer Neg Hx   . Heart attack Neg Hx   . Stroke Neg Hx     Social History Social History   Tobacco Use  . Smoking status: Never  Smoker  . Smokeless tobacco: Never Used  Substance Use Topics  . Alcohol use: No    Alcohol/week: 0.0 standard drinks  . Drug use: No     Allergies   Penicillins   Review of Systems Review of Systems   Physical Exam Triage Vital Signs ED Triage Vitals [02/03/18 1041]  Enc Vitals Group     BP (!) 205/107     Pulse Rate 87     Resp 18     Temp 97.9 F (36.6 C)     Temp Source Oral     SpO2 100 %     Weight      Height  Head Circumference      Peak Flow      Pain Score      Pain Loc      Pain Edu?      Excl. in [place]?    No data found.  Updated Vital Signs BP (!) 205/107 (BP Location: Left Arm)   Pulse 87   Temp 97.9 F (36.6 C) (Oral)   Resp 18   SpO2 100%    Physical Exam  Constitutional: She is oriented to person, place, and time. She appears well-developed. No distress.  Cardiovascular: Normal rate and regular rhythm.  Pulmonary/Chest: Effort normal and breath sounds normal. No respiratory distress.  Neurological: She is alert and oriented to person, place, and time.  Skin: Skin is warm and dry.  Vitals reviewed.    UC Treatments / Results  Labs (all labs ordered are listed, but only abnormal results are displayed) Labs Reviewed - No data to display  EKG None  Radiology No results found.  Procedures Procedures (including critical care time)  Medications Ordered in UC Medications - No data to display  Initial Impression / Assessment and Plan / UC Course  I have reviewed the triage vital signs and the nursing notes.  Pertinent labs & imaging results that were available during my care of the patient were reviewed by me and considered in my medical decision making (see chart for details).     Brief exam as history concerning for ACS, especially noting patient history. EKG without acute changes noted at this time. Patient provided wheelchair transportation by [facility] staff to ER for further evaluation and treatment.  Final Clinical  Impressions(s) / UC Diagnoses   Final diagnoses:  Hypertensive urgency  Chest pressure  Neck pain  Dizziness     Discharge Instructions     Please go to ER for further evaluation of your chest pressure, htn, neck pain and dizziness     ED Prescriptions    None     Controlled Substance Prescriptions [place] Controlled Substance Registry consulted? Not Applicable   Zigmund Gottron, NP 02/03/18 [phone]

## 2018-02-03 NOTE — ED Notes (Signed)
Patient verbalizes understanding of discharge instructions. Opportunity for questioning and answers were provided. Armband removed by staff, pt discharged from ED ambulatory.   

## 2018-02-03 NOTE — ED Notes (Signed)
ED Provider at bedside. 

## 2018-02-03 NOTE — Discharge Instructions (Signed)
Apply ice/heat to the affected area Take muscle relaxer as needed for spasms/pain Take Ibuprofen as needed for pain Please follow up with your doctor Return if worsening

## 2018-02-03 NOTE — ED Notes (Signed)
ekg given to Augusto Gamble, NP

## 2018-02-03 NOTE — ED Triage Notes (Signed)
Pt to ER sent from [facility] for evaluation of central chest pain onset yesterday with pain in posterior neck, radiating down left shoulder and arm. Pt reports onset of dizziness this morning. Pt reports pain 3/10 at this time.

## 2018-02-04 ENCOUNTER — Ambulatory Visit: Payer: Self-pay

## 2018-02-04 ENCOUNTER — Other Ambulatory Visit: Payer: Self-pay | Admitting: Family Medicine

## 2018-02-04 MED ORDER — AMLODIPINE BESYLATE 5 MG PO TABS: 5.0000 mg | ORAL_TABLET | Freq: Every day | ORAL | 11 refills | Status: DC

## 2018-02-04 NOTE — Telephone Encounter (Signed)
Pt. Seen in ED for hypertension and chest pain. Cleared for cardiac issue and given Motrin and muscle relaxer for musculoskeletal pain. States her BP "came down in ED when I took my own medicine." Has not had her medication this morning. BP 201/116. "Not sure if machine is accurate." Has a slight headache. No other symptoms. No availability with Dr. [name] today for ED follow up. Spoke with Rena and pt. Will take BP medication this morning and repeat her BP in 2 hours and call us back.Verbalizes understanding. Will report BP to the practice.  Reason for Disposition . Systolic BP  >= 099 OR Diastolic >= 833  Answer Assessment - Initial Assessment Questions 1. BLOOD PRESSURE: "What is the blood pressure?" "Did you take at least two measurements 5 minutes apart?"     201/116 2. ONSET: "When did you take your blood pressure?"    This morning 3. HOW: "How did you obtain the blood pressure?" (e.g., visiting nurse, automatic home BP Home monitor 4. HISTORY: "Do you have a history of high blood pressure?"     Yes 5. MEDICATIONS: "Are you taking any medications for blood pressure?" "Have you missed any doses recently?"     No 6. OTHER SYMPTOMS: "Do you have any symptoms?" (e.g., headache, chest pain, blurred vision, difficulty breathing, weakness)     Headache. Has neck and back pain. 7. PREGNANCY: "Is there any chance you are pregnant?" "When was your last menstrual period?"     No  Protocols used: HIGH BLOOD PRESSURE-A-AH

## 2018-02-04 NOTE — Addendum Note (Signed)
Addended by: Carter Kitten on: 02/04/2018 05:33 PM   Modules accepted: Orders

## 2018-02-04 NOTE — Telephone Encounter (Signed)
The patient called back to report her BP is 182/104; this was taken on her left arm with a digital cuff; she reports that she still has a slight headache, and she is still having the pain that she was having yesterday; the pt reports that she took her medication this morning at 0830 (losartan, carvedilol, and isosorbide); will route to office and call call pt back with instructions; the pt's contact number is [phone].

## 2018-02-04 NOTE — Telephone Encounter (Signed)
Ms. Christine Barrett notified as instructed by telephone.  She does not have any Amlodipine at home.  Rx for Amlodipine 5 mg sent to [facility].  Appointment scheduled with Dr. [name] 02/08/2018 at 2:00 pm to follow up on blood pressure.

## 2018-02-04 NOTE — Telephone Encounter (Signed)
Spoke with Rena at [facility] and she states that this information has been sent to the provider for review.

## 2018-02-04 NOTE — Telephone Encounter (Signed)
Call pt... Recommend adding additional med as she is on max of those meds. I believe she was on amlodipine 5 mg  in the past... Does she have any to restart? If not let me know.  pt needs follow up next week.

## 2018-02-08 ENCOUNTER — Encounter: Payer: Self-pay | Admitting: Family Medicine

## 2018-02-08 ENCOUNTER — Ambulatory Visit: Payer: Self-pay | Admitting: Family Medicine

## 2018-02-08 DIAGNOSIS — I10 Essential (primary) hypertension: Secondary | ICD-10-CM

## 2018-02-08 DIAGNOSIS — R0789 Other chest pain: Secondary | ICD-10-CM

## 2018-02-08 DIAGNOSIS — R110 Nausea: Secondary | ICD-10-CM | POA: Insufficient documentation

## 2018-02-08 NOTE — Assessment & Plan Note (Signed)
Sue muscle relaxant for pain.. Try to hold ibuprofen as it may be causing nausea.

## 2018-02-08 NOTE — Progress Notes (Signed)
Subjective:    Patient ID: Christine Barrett, female    DOB: 1960-09-17, 57 y.o.   MRN: [id number]  HPI 57 year old female pt with history of malignant hypertension presents following recent BP increase.  Seen At ED on 8/8 with hypertensive emergency.  For a few days prior she had been having a headache, chest wall pain, took some ibuprofen. She has been doing  A lot of things.. Stressed.  [facility] sent her to ER.  EKG nml, troponins neg, neg BHCG Cbc nml,  BMET nml.. nml Cr 0.76  CXR: mild bronchitisc changed, thoracic atherosclerosis   Treated with muscle relaxant and NSAIDs.   She called on 8/9 with BP remaining high.. Amlodipine was added back at 5 mg daily. In addition to coreg, losartan and isosorbide  Her BP is improved today  but she reports  She has been taking ibuprofen for possible costochondritis. BP Readings from Last 3 Encounters:  02/08/18 (!) 149/81  02/03/18 (!) 176/96  02/03/18 (!) 205/107    Echo in 2015 showed EF of 55-60%. Cath in 2017 showed 50% stenosis of prox cx to mid cx which was tx previously with angioplasty, prox LAD to mid LAD 60% stenosed, and Ost 2nd diag to 2nd diag lesion 80% stenosed. Cardiologist is in [place] She last saw them in June. They noted that she has small vessel disease which is not optimal for revascularization.   Chest pain is better with muscle relaxant.  She doe shave off ad on nausea in last few months.. Hit her today.  Still on pantoprazole and zantac.   Blood pressure (!) 149/81, pulse 75, temperature 97.6 F (36.4 C), temperature source Oral, height 5' 8.75" (1.746 m), weight 281 lb 4 oz (127.6 kg). Social History /Family History/Past Medical History reviewed in detail and updated in EMR if needed.  Review of Systems  Constitutional: Negative for fatigue and fever.  HENT: Negative for congestion.   Eyes: Negative for pain.  Respiratory: Negative for cough and shortness of breath.   Cardiovascular: Negative for chest pain,  palpitations and leg swelling.  Gastrointestinal: Negative for abdominal pain.  Genitourinary: Negative for dysuria and vaginal bleeding.  Musculoskeletal: Negative for back pain.  Neurological: Negative for syncope, light-headedness and headaches.  Psychiatric/Behavioral: Negative for dysphoric mood.       Objective:   Physical Exam  Constitutional: Vital signs are normal. She appears well-developed and well-nourished. She is cooperative.  Non-toxic appearance. She does not appear ill. No distress.  HENT:  Head: Normocephalic.  Right Ear: Hearing, tympanic membrane, external ear and ear canal normal. Tympanic membrane is not erythematous, not retracted and not bulging.  Left Ear: Hearing, tympanic membrane, external ear and ear canal normal. Tympanic membrane is not erythematous, not retracted and not bulging.  Nose: No mucosal edema or rhinorrhea. Right sinus exhibits no maxillary sinus tenderness and no frontal sinus tenderness. Left sinus exhibits no maxillary sinus tenderness and no frontal sinus tenderness.  Mouth/Throat: Uvula is midline, oropharynx is clear and moist and mucous membranes are normal.  Eyes: Pupils are equal, round, and reactive to light. Conjunctivae, EOM and lids are normal. Lids are everted and swept, no foreign bodies found.  Neck: Trachea normal and normal range of motion. Neck supple. Carotid bruit is not present. No thyroid mass and no thyromegaly present.  Cardiovascular: Normal rate, regular rhythm, S1 normal, S2 normal, normal heart sounds, intact distal pulses and normal pulses. Exam reveals no gallop and no friction rub.  No murmur  heard. Pulmonary/Chest: Effort normal and breath sounds normal. No tachypnea. No respiratory distress. She has no decreased breath sounds. She has no wheezes. She has no rhonchi. She has no rales.  ttp over left chest wall  Abdominal: Soft. Normal appearance and bowel sounds are normal. There is no tenderness.  Neurological: She  is alert.  Skin: Skin is warm, dry and intact. No rash noted.  Psychiatric: Her speech is normal and behavior is normal. Judgment and thought content normal. Her mood appears not anxious. Cognition and memory are normal. She does not exhibit a depressed mood.          Assessment & Plan:

## 2018-02-08 NOTE — Patient Instructions (Addendum)
Follow BP at home.. Goal < 140/90.  Stay back on amlodipine 5 mg daily.  Stop ibuprofen.  Use muscle relaxant as needed for chest wall pain.  Stop multivitamin for now or change to no iron multivitamin.  Work on stress reduction.

## 2018-02-08 NOTE — Assessment & Plan Note (Signed)
Continue 5 mg amlodipine for now.. If remaining high will increase to 10 mg daily. Continue other medicaitons.

## 2018-02-08 NOTE — Assessment & Plan Note (Signed)
Likely related to NSAID.. Stop. Continue PPI and H2B. Nml labs. Hold multivitamin.  If not improving consider referral to GI.

## 2018-03-15 ENCOUNTER — Ambulatory Visit: Payer: Self-pay | Admitting: Family Medicine

## 2018-03-15 DIAGNOSIS — Z0289 Encounter for other administrative examinations: Secondary | ICD-10-CM

## 2018-04-05 ENCOUNTER — Encounter (HOSPITAL_COMMUNITY): Payer: Self-pay

## 2018-04-05 ENCOUNTER — Ambulatory Visit (HOSPITAL_COMMUNITY)
Admission: EM | Admit: 2018-04-05 | Discharge: 2018-04-05 | Disposition: A | Discharge status: Home / Self Care | Payer: Self-pay | Attending: Family Medicine | Admitting: Family Medicine

## 2018-04-05 ENCOUNTER — Other Ambulatory Visit: Payer: Self-pay

## 2018-04-05 DIAGNOSIS — R059 Cough, unspecified: Secondary | ICD-10-CM

## 2018-04-05 DIAGNOSIS — J069 Acute upper respiratory infection, unspecified: Secondary | ICD-10-CM

## 2018-04-05 DIAGNOSIS — R05 Cough: Secondary | ICD-10-CM

## 2018-04-05 MED ORDER — HYDROCODONE-HOMATROPINE 5-1.5 MG/5ML PO SYRP: 5.0000 mL | ORAL_SOLUTION | Freq: Four times a day (QID) | ORAL | 0 refills | Status: DC | PRN

## 2018-04-05 NOTE — Discharge Instructions (Signed)
Be aware, pain medications may cause drowsiness. Please do not drive, operate heavy machinery or make important decisions while on this medication, it can cloud your judgement.  Be aware, your cough medication may cause drowsiness. Please do not drive, operate heavy machinery or make important decisions while on this medication, it can cloud your judgement.  Follow up with your primary care doctor or here if you are not seeing improvement of your symptoms over the next several days, sooner if you feel you are worsening.  Caring for yourself: Get plenty of rest. Drink plenty of fluids, enough so that your urine is light yellow or clear like water. If you have kidney, heart, or liver disease and have to limit fluids, talk with your doctor before you increase the amount of fluids you drink. Take an over-the-counter pain medicine if needed, such as acetaminophen (Tylenol), ibuprofen (Advil, Motrin), or naproxen (Aleve), to relieve fever, headache, and muscle aches. Read and follow all instructions on the label. No one younger than 20 should take aspirin. It has been linked to Reye syndrome, a serious illness. Before you use over the counter cough and cold medicines, check the label. These medicines may not be safe for children younger than age 73 or for people with certain health problems. If the skin around your nose and lips becomes sore, put some petroleum jelly on the area.  Avoid spreading a virus: Wash your hands regularly, and keep your hands away from your face.  Stay home from school, work, and other public places until you are feeling better and your fever has been gone for at least 24 hours. The fever needs to have gone away on its own without the help of medicine.

## 2018-04-05 NOTE — ED Triage Notes (Signed)
Pt states she has sinus pressure and drainage and allergies. Pt states she has chest congestion. X  5 days

## 2018-04-05 NOTE — ED Provider Notes (Signed)
[place]   [id number] 04/05/18 Arrival Time: 3557  ASSESSMENT & PLAN:  1. Viral upper respiratory tract infection   2. Cough     Meds ordered this encounter  Medications  . HYDROcodone-homatropine (HYCODAN) 5-1.5 MG/5ML syrup    Sig: Take 5 mLs by mouth every 6 (six) hours as needed for cough.    Dispense:  90 mL    Refill:  0   Cough medication sedation precautions. Discussed typical duration of symptoms. OTC symptom care as needed. Ensure adequate fluid intake and rest. May f/u with PCP or here as needed.  Reviewed expectations re: course of current medical issues. Questions answered. Outlined signs and symptoms indicating need for more acute intervention. Patient verbalized understanding. After Visit Summary given.   SUBJECTIVE: History from: patient.  Christine Barrett is a 57 y.o. female who presents with complaint of nasal congestion, post-nasal drainage, and a persistent dry cough. Onset abrupt, approximately 4 days ago. Overall fatigued with body aches. SOB: none. Wheezing: none. Fever: unsure. Overall normal PO intake without n/v. Sick contacts: no. OTC treatment: "Sinus rinse" with mild help.  Received flu shot this year: no.  Social History   Tobacco Use  Smoking Status Never Smoker  Smokeless Tobacco Never Used    ROS: As per HPI.   OBJECTIVE:  Vitals:   04/05/18 1548 04/05/18 1619  BP: (!) 151/94   Pulse: 79   Resp: 18   Temp: 98.4 F (36.9 C)   TempSrc: Oral   SpO2: 97%   Weight:  126.1 kg     General appearance: alert; appears fatigued HEENT: nasal congestion; clear runny nose; throat irritation secondary to post-nasal drainage Neck: supple without LAD Lungs: unlabored respirations, symmetrical air entry; cough: moderate; no respiratory distress Skin: warm and dry Psychological: alert and cooperative; normal mood and affect    Allergies  Allergen Reactions  . Penicillins Rash    Pt states she has taken since intial   reaction without recurrence.  Has patient had a PCN reaction causing immediate rash, facial/tongue/throat swelling, SOB or lightheadedness with hypotension:  no Has patient had a PCN reaction causing severe rash involving mucus membranes or skin necrosis: no Has patient had a PCN reaction that required hospitalization no Has patient had a PCN reaction occurring within the last 10 years: no If all of the above answers are "NO", then may proceed with Cephalospori    Past Medical History:  Diagnosis Date  . Allergy    SEASONAL  . Anemia   . CAD S/P percutaneous coronary angioplasty 09/2013   a) Ostial AV G Cx - 2.5 mm Angiosculpt; mid LAD 40-60%; b) Myoview 07/2014: LOW RISK, small-severe fixed inferior defect c/w infarct w/o peri-infarct ischemia.  . Chronic bronchitis ([place])    "frequently; not q yr" (10/03/2013)  . Diverticulosis   . Essential hypertension    with prior Accelerated HTN  . Hiatal hernia   . Hx of non-ST elevation myocardial infarction (NSTEMI) 10/01/2013   Due to Accelerated HTN with existing CAD  . Hyperlipemia   . Migraine    "@ least once/month" (10/03/2013)  . OSA (obstructive sleep apnea) 06/10/2016  . Schatzki's ring   . Seasonal allergies   . Sinusitis    Family History  Adopted: Yes  Problem Relation Age of Onset  . Diabetes Mother   . Breast cancer Sister   . Hypertension Sister   . Colon cancer Neg Hx   . Heart attack Neg Hx   . Stroke  Neg Hx    Social History   Socioeconomic History  . Marital status: Married    Spouse name: Herbie [place]  . Number of children: 2  . Years of education: Not on file  . Highest education level: Not on file  Occupational History  . Occupation: Research scientist (life sciences)  Social Needs  . Financial resource strain: Not on file  . Food insecurity:    Worry: Not on file    Inability: Not on file  . Transportation needs:    Medical: Not on file    Non-medical: Not on file  Tobacco Use  . Smoking status: Never Smoker  . Smokeless  tobacco: Never Used  Substance and Sexual Activity  . Alcohol use: No    Alcohol/week: 0.0 standard drinks  . Drug use: No  . Sexual activity: Yes    Birth control/protection: Surgical  Lifestyle  . Physical activity:    Days per week: Not on file    Minutes per session: Not on file  . Stress: Not on file  Relationships  . Social connections:    Talks on phone: Not on file    Gets together: Not on file    Attends religious service: Not on file    Active member of club or organization: Not on file    Attends meetings of clubs or organizations: Not on file    Relationship status: Not on file  . Intimate partner violence:    Fear of current or ex partner: Not on file    Emotionally abused: Not on file    Physically abused: Not on file    Forced sexual activity: Not on file  Other Topics Concern  . Not on file  Social History Narrative   She is a married mother of 2, grandmother 2. Her current marriage is than a pack years.    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 She never smoked and does not drink All. She does do time and balancing exercises but does not do routine of exercise.   She herself is adopted.           Vanessa Kick, MD 04/06/18 [phone]

## 2018-05-03 ENCOUNTER — Ambulatory Visit: Payer: Self-pay | Admitting: Nurse Practitioner

## 2018-05-03 ENCOUNTER — Encounter: Payer: Self-pay | Admitting: Nurse Practitioner

## 2018-05-03 VITALS — BP 160/86 | HR 72 | Temp 97.7°F | Ht 68.75 in | Wt 288.0 lb

## 2018-05-03 DIAGNOSIS — J301 Allergic rhinitis due to pollen: Secondary | ICD-10-CM

## 2018-05-03 DIAGNOSIS — R0981 Nasal congestion: Secondary | ICD-10-CM

## 2018-05-03 MED ORDER — PREDNISONE 10 MG (21) PO TBPK: ORAL_TABLET | ORAL | 0 refills | Status: DC

## 2018-05-03 MED ORDER — MONTELUKAST SODIUM 10 MG PO TABS: 10.0000 mg | ORAL_TABLET | Freq: Every day | ORAL | 1 refills | Status: DC

## 2018-05-03 MED ORDER — GUAIFENESIN ER 600 MG PO TB12: 600.0000 mg | ORAL_TABLET | Freq: Two times a day (BID) | ORAL | 0 refills | Status: DC | PRN

## 2018-05-03 NOTE — Progress Notes (Signed)
Subjective:  Patient ID: Christine Barrett, female    DOB: 1961-01-28  Age: 57 y.o. MRN: [id number]  CC: Sinusitis (pt is complaining of sinus pressure, left ear pain,felt dizzy at times. going on for 1 wk. pt normally get this every year. using sinuse rinse. )   Sinusitis  This is a recurrent problem. The current episode started 1 to 4 weeks ago. The problem has been waxing and waning since onset. There has been no fever. Associated symptoms include congestion, ear pain, headaches and sinus pressure. Pertinent negatives include no chills, coughing, hoarse voice, neck pain, shortness of breath, sneezing or sore throat. Past treatments include saline sprays and oral decongestants. The treatment provided mild relief.   reports she has not taken BP medications today. Typically take at noon. Reports elevated home BP.  Reviewed past Medical, Social and Family history today.  Outpatient Medications Prior to Visit  Medication Sig Dispense Refill  . albuterol (PROVENTIL HFA;VENTOLIN HFA) 108 (90 Base) MCG/ACT inhaler INHALE 2 PUFFS BY MOUTH EVERY 6 HOURS AS NEEDED FOR WHEEZING OR  SHORTNESS  OF  BREATH 18 each 0  . amLODipine (NORVASC) 5 MG tablet Take 1 tablet (5 mg total) by mouth daily. 30 tablet 11  . aspirin EC 81 MG EC tablet Take 1 tablet (81 mg total) by mouth daily.    [name] atorvastatin (LIPITOR) 80 MG tablet Take 1 tablet (80 mg total) by mouth daily. 90 tablet 3  . carvedilol (COREG) 25 MG tablet Take 1 tablet (25 mg total) by mouth 2 (two) times daily with a meal. 180 tablet 3  . HYDROcodone-homatropine (HYCODAN) 5-1.5 MG/5ML syrup Take 5 mLs by mouth every 6 (six) hours as needed for cough. 90 mL 0  . isosorbide mononitrate (IMDUR) 60 MG 24 hr tablet Take 2 tablets (120 mg total) by mouth daily. 180 tablet 3  . losartan (COZAAR) 100 MG tablet Take 1 tablet (100 mg total) by mouth daily. 90 tablet 3  . Multiple Vitamin (MULTIVITAMIN WITH MINERALS) TABS tablet Take 1 tablet by mouth daily.    .  Multiple Vitamins-Minerals (EMERGEN-C IMMUNE) PACK Take 1 packet by mouth daily.    . nitroGLYCERIN (NITROSTAT) 0.4 MG SL tablet Place 1 tablet (0.4 mg total) under the tongue every 5 (five) minutes as needed for chest pain. May take up to 3 tablets. If still having chest pain, call 911 immediately. 20 tablet 0  . pantoprazole (PROTONIX) 40 MG tablet Take 1 tablet (40 mg total) by mouth daily. 90 tablet 3  . ranitidine (ZANTAC) 150 MG tablet Take 1 tablet (150 mg total) by mouth daily. 90 tablet 3  . methocarbamol (ROBAXIN) 500 MG tablet Take 1 tablet (500 mg total) by mouth 2 (two) times daily. (Patient not taking: Reported on 05/03/2018) 20 tablet 0   No facility-administered medications prior to visit.     ROS See HPI  Objective:  BP (!) 160/86   Pulse 72   Temp 97.7 F (36.5 C) (Oral)   Ht 5' 8.75" (1.746 m)   Wt 288 lb (130.6 kg)   SpO2 97%   BMI 42.84 kg/m   BP Readings from Last 3 Encounters:  05/03/18 (!) 160/86  04/05/18 (!) 151/94  02/08/18 (!) 149/81    Wt Readings from Last 3 Encounters:  05/03/18 288 lb (130.6 kg)  04/05/18 278 lb (126.1 kg)  02/08/18 281 lb 4 oz (127.6 kg)    Physical Exam  Constitutional: She is oriented to person, place, and time.  No distress.  HENT:  Right Ear: Tympanic membrane, external ear and ear canal normal.  Left Ear: Tympanic membrane, external ear and ear canal normal.  Nose: Mucosal edema and rhinorrhea present. Right sinus exhibits maxillary sinus tenderness and frontal sinus tenderness. Left sinus exhibits maxillary sinus tenderness and frontal sinus tenderness.  Mouth/Throat: Uvula is midline. No trismus in the jaw. Posterior oropharyngeal erythema present. Tonsils are 2+ on the right. Tonsils are 2+ on the left.  Neck: Normal range of motion. Neck supple.  Cardiovascular: Normal rate.  Pulmonary/Chest: Effort normal.  Neurological: She is alert and oriented to person, place, and time.  Skin: No rash noted.  Psychiatric: She  has a normal mood and affect. Her behavior is normal. Thought content normal.  Vitals reviewed.   Lab Results  Component Value Date   WBC 6.9 02/03/2018   HGB 12.3 02/03/2018   HCT 40.2 02/03/2018   PLT 274 02/03/2018   GLUCOSE 108 (H) 02/03/2018   CHOL 219 (H) 02/23/2017   TRIG 161.0 (H) 02/23/2017   HDL 37.40 (L) 02/23/2017   LDLDIRECT 192.2 02/11/2009   LDLCALC 149 (H) 02/23/2017   ALT 21 09/20/2017   AST 18 09/20/2017   NA 141 02/03/2018   K 3.8 02/03/2018   CL 106 02/03/2018   CREATININE 0.76 02/03/2018   BUN 11 02/03/2018   CO2 25 02/03/2018   TSH 2.620 10/01/2013   INR 0.92 08/02/2015   HGBA1C 6.3 09/20/2017    Assessment & Plan:   Luda was seen today for sinusitis.  Diagnoses and all orders for this visit:  Seasonal allergic rhinitis due to pollen -     predniSONE (STERAPRED UNI-PAK 21 TAB) 10 MG (21) TBPK tablet; As directed on package -     montelukast (SINGULAIR) 10 MG tablet; Take 1 tablet (10 mg total) by mouth at bedtime.  Congestion of paranasal sinus -     predniSONE (STERAPRED UNI-PAK 21 TAB) 10 MG (21) TBPK tablet; As directed on package -     montelukast (SINGULAIR) 10 MG tablet; Take 1 tablet (10 mg total) by mouth at bedtime. -     guaiFENesin (MUCINEX) 600 MG 12 hr tablet; Take 1 tablet (600 mg total) by mouth 2 (two) times daily as needed for cough or to loosen phlegm.   I am having Dodge start on predniSONE, montelukast, and guaiFENesin. I am also having her maintain her aspirin, nitroGLYCERIN, multivitamin with minerals, EMERGEN-C IMMUNE, atorvastatin, carvedilol, isosorbide mononitrate, losartan, pantoprazole, ranitidine, methocarbamol, albuterol, amLODipine, and HYDROcodone-homatropine.  Meds ordered this encounter  Medications  . predniSONE (STERAPRED UNI-PAK 21 TAB) 10 MG (21) TBPK tablet    Sig: As directed on package    Dispense:  21 tablet    Refill:  0    Order Specific Question:   Supervising Provider    Answer:   Lucille Passy [[id number]]  . montelukast (SINGULAIR) 10 MG tablet    Sig: Take 1 tablet (10 mg total) by mouth at bedtime.    Dispense:  30 tablet    Refill:  1    Order Specific Question:   Supervising Provider    Answer:   Lucille Passy [[id number]]  . guaiFENesin (MUCINEX) 600 MG 12 hr tablet    Sig: Take 1 tablet (600 mg total) by mouth 2 (two) times daily as needed for cough or to loosen phlegm.    Dispense:  14 tablet    Refill:  0    Order Specific Question:  Supervising Provider    Answer:   Lucille Passy [[id number]]    Follow-up: Return if symptoms worsen or fail to improve.  Wilfred Lacy, NP

## 2018-05-03 NOTE — Patient Instructions (Signed)
F/up with pcp and/or pulmonology to discuss CPAP masking fitting. F/up with pcp about HTN control Avoid decongestant due to uncontrolled BP.  Maintain adequate oral hydration. Continue saline sinus rinse once a day.   Sinusitis, Adult Sinusitis is soreness and inflammation of your sinuses. Sinuses are hollow spaces in the bones around your face. They are located:  Around your eyes.  In the middle of your forehead.  Behind your nose.  In your cheekbones.  Your sinuses and nasal passages are lined with a stringy fluid (mucus). Mucus normally drains out of your sinuses. When your nasal tissues get inflamed or swollen, the mucus can get trapped or blocked so air cannot flow through your sinuses. This lets bacteria, viruses, and funguses grow, and that leads to infection. Follow these instructions at home: Medicines  Take, use, or apply over-the-counter and prescription medicines only as told by your doctor. These may include nasal sprays.  If you were prescribed an antibiotic medicine, take it as told by your doctor. Do not stop taking the antibiotic even if you start to feel better. Hydrate and Humidify  Drink enough water to keep your pee (urine) clear or pale yellow.  Use a cool mist humidifier to keep the humidity level in your home above 50%.  Breathe in steam for 10-15 minutes, 3-4 times a day or as told by your doctor. You can do this in the bathroom while a hot shower is running.  Try not to spend time in cool or dry air. Rest  Rest as much as possible.  Sleep with your head raised (elevated).  Make sure to get enough sleep each night. General instructions  Put a warm, moist washcloth on your face 3-4 times a day or as told by your doctor. This will help with discomfort.  Wash your hands often with soap and water. If there is no soap and water, use hand sanitizer.  Do not smoke. Avoid being around people who are smoking (secondhand smoke).  Keep all follow-up  visits as told by your doctor. This is important. Contact a doctor if:  You have a fever.  Your symptoms get worse.  Your symptoms do not get better within 10 days. Get help right away if:  You have a very bad headache.  You cannot stop throwing up (vomiting).  You have pain or swelling around your face or eyes.  You have trouble seeing.  You feel confused.  Your neck is stiff.  You have trouble breathing. This information is not intended to replace advice given to you by your health care provider. Make sure you discuss any questions you have with your health care provider. Document Released: 12/02/2007 Document Revised: 02/09/2016 Document Reviewed: 04/10/2015 Elsevier Interactive Patient Education  [employer].

## 2018-08-23 ENCOUNTER — Other Ambulatory Visit: Payer: Self-pay | Admitting: Family Medicine

## 2018-08-23 ENCOUNTER — Other Ambulatory Visit: Payer: Self-pay | Admitting: Nurse Practitioner

## 2018-08-23 DIAGNOSIS — J301 Allergic rhinitis due to pollen: Secondary | ICD-10-CM

## 2018-08-23 DIAGNOSIS — R0981 Nasal congestion: Secondary | ICD-10-CM

## 2018-08-30 LAB — HM MAMMOGRAPHY

## 2018-09-01 ENCOUNTER — Encounter: Payer: Self-pay | Admitting: Family Medicine

## 2018-11-24 ENCOUNTER — Telehealth: Payer: Self-pay

## 2018-11-24 NOTE — Telephone Encounter (Signed)
Pt left v/m that ranitidine is no longer available and pt wants to know what med could be substituted to take with her pantoprazole now. Pt request cb.

## 2018-11-24 NOTE — Telephone Encounter (Signed)
Can use pepcid AC

## 2018-11-25 NOTE — Telephone Encounter (Signed)
Left message for Ketina that she can try Pepcid AC per Dr. [name].

## 2019-01-11 ENCOUNTER — Other Ambulatory Visit: Payer: Self-pay | Admitting: Family Medicine

## 2019-01-11 ENCOUNTER — Other Ambulatory Visit: Payer: Self-pay | Admitting: Nurse Practitioner

## 2019-01-11 DIAGNOSIS — J301 Allergic rhinitis due to pollen: Secondary | ICD-10-CM

## 2019-01-11 DIAGNOSIS — R0981 Nasal congestion: Secondary | ICD-10-CM

## 2019-01-12 NOTE — Telephone Encounter (Signed)
Please see refill request.

## 2019-02-24 ENCOUNTER — Other Ambulatory Visit: Payer: Self-pay

## 2019-02-24 ENCOUNTER — Ambulatory Visit (INDEPENDENT_AMBULATORY_CARE_PROVIDER_SITE_OTHER): Payer: Self-pay | Admitting: Family Medicine

## 2019-02-24 ENCOUNTER — Encounter: Payer: Self-pay | Admitting: Family Medicine

## 2019-02-24 VITALS — BP 150/90 | HR 76 | Temp 97.9°F | Ht 68.75 in | Wt 282.5 lb

## 2019-02-24 DIAGNOSIS — M940 Chondrocostal junction syndrome [Tietze]: Secondary | ICD-10-CM

## 2019-02-24 DIAGNOSIS — K219 Gastro-esophageal reflux disease without esophagitis: Secondary | ICD-10-CM

## 2019-02-24 DIAGNOSIS — Z23 Encounter for immunization: Secondary | ICD-10-CM

## 2019-02-24 DIAGNOSIS — I10 Essential (primary) hypertension: Secondary | ICD-10-CM

## 2019-02-24 MED ORDER — CARVEDILOL 25 MG PO TABS: 25.0000 mg | ORAL_TABLET | Freq: Two times a day (BID) | ORAL | 3 refills | Status: DC

## 2019-02-24 MED ORDER — IBUPROFEN 800 MG PO TABS: 800.0000 mg | ORAL_TABLET | Freq: Three times a day (TID) | ORAL | 0 refills | Status: DC | PRN

## 2019-02-24 MED ORDER — LOSARTAN POTASSIUM 100 MG PO TABS: 100.0000 mg | ORAL_TABLET | Freq: Every day | ORAL | 3 refills | Status: DC

## 2019-02-24 MED ORDER — AMLODIPINE BESYLATE 5 MG PO TABS: 5.0000 mg | ORAL_TABLET | Freq: Every day | ORAL | 3 refills | Status: DC

## 2019-02-24 MED ORDER — ISOSORBIDE MONONITRATE ER 60 MG PO TB24: 120.0000 mg | ORAL_TABLET | Freq: Every day | ORAL | 3 refills | Status: DC

## 2019-02-24 NOTE — Addendum Note (Signed)
Addended by: Carter Kitten on: 02/24/2019 04:12 PM   Modules accepted: Orders

## 2019-02-24 NOTE — Assessment & Plan Note (Signed)
Previously improved control when taking all 4 meds. Refilled all. Follow up in 3 months after restarting  Losartan.

## 2019-02-24 NOTE — Assessment & Plan Note (Signed)
Flares 1-2 times a year. Responds best to ibuprofen 800 mg... she will use only then given CAD and GERD history.

## 2019-02-24 NOTE — Progress Notes (Signed)
Chief Complaint  Patient presents with  . Hypertension  . Medication Refill    History of Present Illness: HPI  58 year old female presents for follow up HTN  Hypertension:   Not at goal in office today on amlodipine, carvedilol, losartan ( has been off lately), imdur BP Readings from Last 3 Encounters:  02/24/19 (!) 150/90  05/03/18 (!) 160/86  04/05/18 (!) 151/94  Using medication without problems or lightheadedness: none Chest pain with exertion:none Edema:none Short of breath:none Average home BPs: not chekcing Other issues:  She has been walking more, eating healthier. Wt Readings from Last 3 Encounters:  02/24/19 282 lb 8 oz (128.1 kg)  05/03/18 288 lb (130.6 kg)  04/05/18 278 lb (126.1 kg)      Has a flare of costochondritis only about every 4 months... ibuprofen 800 helps.     Still on pantoprazole and pepcid AC... does not work as well as ranitidine.  She has not been seen in a year.. overdue for CPX.  COVID 19 screen No recent travel or known exposure to COVID19 The patient denies respiratory symptoms of COVID 19 at this time.  The importance of social distancing was discussed today.   Review of Systems  Constitutional: Negative for chills and fever.  HENT: Negative for congestion and ear pain.   Eyes: Negative for pain and redness.  Respiratory: Negative for cough and shortness of breath.   Cardiovascular: Negative for chest pain, palpitations and leg swelling.  Gastrointestinal: Negative for abdominal pain, blood in stool, constipation, diarrhea, nausea and vomiting.  Genitourinary: Negative for dysuria.  Musculoskeletal: Negative for falls and myalgias.  Skin: Negative for rash.  Neurological: Negative for dizziness.  Psychiatric/Behavioral: Negative for depression. The patient is not nervous/anxious.       Past Medical History:  Diagnosis Date  . Allergy    SEASONAL  . Anemia   . CAD S/P percutaneous coronary angioplasty 09/2013   a) Ostial  AV G Cx - 2.5 mm Angiosculpt; mid LAD 40-60%; b) Myoview 07/2014: LOW RISK, small-severe fixed inferior defect c/w infarct w/o peri-infarct ischemia.  . Chronic bronchitis ([place])    "frequently; not q yr" (10/03/2013)  . Diverticulosis   . Essential hypertension    with prior Accelerated HTN  . Hiatal hernia   . Hx of non-ST elevation myocardial infarction (NSTEMI) 10/01/2013   Due to Accelerated HTN with existing CAD  . Hyperlipemia   . Migraine    "@ least once/month" (10/03/2013)  . OSA (obstructive sleep apnea) 06/10/2016  . Schatzki's ring   . Seasonal allergies   . Sinusitis     reports that she has never smoked. She has never used smokeless tobacco. She reports that she does not drink alcohol or use drugs.   Current Outpatient Medications:  .  albuterol (PROVENTIL HFA;VENTOLIN HFA) 108 (90 Base) MCG/ACT inhaler, INHALE 2 PUFFS BY MOUTH EVERY 6 HOURS AS NEEDED FOR WHEEZING OR  SHORTNESS  OF  BREATH, Disp: 18 each, Rfl: 0 .  amLODipine (NORVASC) 5 MG tablet, Take 1 tablet (5 mg total) by mouth daily., Disp: 30 tablet, Rfl: 11 .  aspirin EC 81 MG EC tablet, Take 1 tablet (81 mg total) by mouth daily., Disp: , Rfl:  .  atorvastatin (LIPITOR) 80 MG tablet, Take 1 tablet (80 mg total) by mouth daily., Disp: 90 tablet, Rfl: 3 .  carvedilol (COREG) 25 MG tablet, Take 1 tablet (25 mg total) by mouth 2 (two) times daily with a meal., Disp: 180  tablet, Rfl: 3 .  famotidine (PEPCID) 20 MG tablet, Take 20 mg by mouth 2 (two) times daily., Disp: , Rfl:  .  isosorbide mononitrate (IMDUR) 60 MG 24 hr tablet, Take 2 tablets (120 mg total) by mouth daily. NEEDS OFFICE VISIT, Disp: 60 tablet, Rfl: 0 .  losartan (COZAAR) 100 MG tablet, Take 1 tablet (100 mg total) by mouth daily., Disp: 90 tablet, Rfl: 3 .  montelukast (SINGULAIR) 10 MG tablet, TAKE 1 TABLET BY MOUTH AT BEDTIME, Disp: 30 tablet, Rfl: 0 .  Multiple Vitamin (MULTIVITAMIN WITH MINERALS) TABS tablet, Take 1 tablet by mouth daily., Disp: , Rfl:   .  Multiple Vitamins-Minerals (EMERGEN-C IMMUNE) PACK, Take 1 packet by mouth daily., Disp: , Rfl:  .  nitroGLYCERIN (NITROSTAT) 0.4 MG SL tablet, Place 1 tablet (0.4 mg total) under the tongue every 5 (five) minutes as needed for chest pain. May take up to 3 tablets. If still having chest pain, call 911 immediately., Disp: 20 tablet, Rfl: 0 .  pantoprazole (PROTONIX) 40 MG tablet, Take 1 tablet (40 mg total) by mouth daily., Disp: 90 tablet, Rfl: 3 .  methocarbamol (ROBAXIN) 500 MG tablet, Take 1 tablet (500 mg total) by mouth 2 (two) times daily. (Patient not taking: Reported on 05/03/2018), Disp: 20 tablet, Rfl: 0   Observations/Objective: Blood pressure (!) 150/90, pulse 76, temperature 97.9 F (36.6 C), temperature source Temporal, height 5' 8.75" (1.746 m), weight 282 lb 8 oz (128.1 kg), SpO2 98 %.  Physical Exam Constitutional:      General: She is not in acute distress.    Appearance: Normal appearance. She is well-developed. She is not ill-appearing or toxic-appearing.  HENT:     Head: Normocephalic.     Right Ear: Hearing, tympanic membrane, ear canal and external ear normal. Tympanic membrane is not erythematous, retracted or bulging.     Left Ear: Hearing, tympanic membrane, ear canal and external ear normal. Tympanic membrane is not erythematous, retracted or bulging.     Nose: No mucosal edema or rhinorrhea.     Right Sinus: No maxillary sinus tenderness or frontal sinus tenderness.     Left Sinus: No maxillary sinus tenderness or frontal sinus tenderness.     Mouth/Throat:     Pharynx: Uvula midline.  Eyes:     General: Lids are normal. Lids are everted, no foreign bodies appreciated.     Conjunctiva/sclera: Conjunctivae normal.     Pupils: Pupils are equal, round, and reactive to light.  Neck:     Musculoskeletal: Normal range of motion and neck supple.     Thyroid: No thyroid mass or thyromegaly.     Vascular: No carotid bruit.     Trachea: Trachea normal.   Cardiovascular:     Rate and Rhythm: Normal rate and regular rhythm.     Pulses: Normal pulses.     Heart sounds: Normal heart sounds, S1 normal and S2 normal. No murmur. No friction rub. No gallop.   Pulmonary:     Effort: Pulmonary effort is normal. No tachypnea or respiratory distress.     Breath sounds: Normal breath sounds. No decreased breath sounds, wheezing, rhonchi or rales.  Abdominal:     General: Bowel sounds are normal.     Palpations: Abdomen is soft.     Tenderness: There is no abdominal tenderness.  Skin:    General: Skin is warm and dry.     Findings: No rash.  Neurological:     Mental Status: She is  alert.  Psychiatric:        Mood and Affect: Mood is not anxious or depressed.        Speech: Speech normal.        Behavior: Behavior normal. Behavior is cooperative.        Thought Content: Thought content normal.        Judgment: Judgment normal.      Assessment and Plan Essential hypertension Previously improved control when taking all 4 meds. Refilled all. Follow up in 3 months after restarting  Losartan.  Costochondritis  Flares 1-2 times a year. Responds best to ibuprofen 800 mg... she will use only then given CAD and GERD history.  GERD (gastroesophageal reflux disease)  Not aas well controlled on pepcid AC and pantoprazole.. increase pepcid to BID.     Eliezer Lofts, MD

## 2019-02-24 NOTE — Patient Instructions (Signed)
Can increase pepcid AC to twice daily  In addition to  Pantoprazole.  Follow BP at home back on losartan .. gaol < 140/90.

## 2019-02-24 NOTE — Assessment & Plan Note (Signed)
Not aas well controlled on pepcid AC and pantoprazole.. increase pepcid to BID.

## 2019-03-29 ENCOUNTER — Telehealth: Payer: Self-pay | Admitting: Family Medicine

## 2019-03-29 NOTE — Telephone Encounter (Signed)
Best number [phone]  Pt called wanting to make an in office appointment.   She stated she has sinus pressure  Left side face and ear very painful  Offered pt doxy me appointment pt declined stated she thinks she needs to come to have ear checked  Please advise can pt come in office

## 2019-03-29 NOTE — Telephone Encounter (Signed)
If any of the following in addition to her sinus pressure and ear pain... she should do virtual or go to urgent care. Fever, congestion/runny nose, sore throat. Just in case.  If she agreed to virtual.. Let her know that in this setting if she has had symptoms for a while  (not just 1-2 days) and if it really sounds like an ear infection.. I  have  a low threshold to her trying antibiotics without ear exam.

## 2019-03-30 ENCOUNTER — Encounter: Payer: Self-pay | Admitting: Family Medicine

## 2019-03-30 ENCOUNTER — Ambulatory Visit (INDEPENDENT_AMBULATORY_CARE_PROVIDER_SITE_OTHER): Payer: Self-pay | Admitting: Family Medicine

## 2019-03-30 VITALS — Ht 68.75 in

## 2019-03-30 DIAGNOSIS — H9202 Otalgia, left ear: Secondary | ICD-10-CM | POA: Insufficient documentation

## 2019-03-30 DIAGNOSIS — J0100 Acute maxillary sinusitis, unspecified: Secondary | ICD-10-CM

## 2019-03-30 MED ORDER — AZITHROMYCIN 250 MG PO TABS: ORAL_TABLET | ORAL | 0 refills | Status: DC

## 2019-03-30 MED ORDER — FLUTICASONE PROPIONATE 50 MCG/ACT NA SUSP: 2.0000 | Freq: Every day | NASAL | 0 refills | Status: DC

## 2019-03-30 NOTE — Telephone Encounter (Signed)
Appointment 10/1 Pt aware

## 2019-03-30 NOTE — Assessment & Plan Note (Signed)
Given > 2 weeks of symptoms... possible bacterial infection on top of allergies. Treat with  antibiotisc.. continue antihistamines and saline.

## 2019-03-30 NOTE — Progress Notes (Signed)
VIRTUAL VISIT Due to national recommendations of social distancing due to [place] 19, a virtual visit is felt to be most appropriate for this patient at this time.   I connected with the patient on 03/30/19 at  9:40 AM EDT by virtual telehealth platform and verified that I am speaking with the correct person using two identifiers.   I discussed the limitations, risks, security and privacy concerns of performing an evaluation and management service by  virtual telehealth platform and the availability of in person appointments. I also discussed with the patient that there may be a patient responsible charge related to this service. The patient expressed understanding and agreed to proceed.  Patient location: Home Provider Location: [place] Participants: Christine Barrett and Leana Roe   Chief Complaint  Patient presents with  . Sinus Pressure    x 2 weeks  . Ear Pain    History of Present Illness: Sinus Problem This is a new problem. The current episode started 1 to 4 weeks ago. The problem has been gradually worsening since onset. There has been no fever. The pain is moderate (left facial pressure). Associated symptoms include ear pain and headaches. Pertinent negatives include no congestion, coughing, hoarse voice, shortness of breath or sore throat. Past treatments include oral decongestants and saline sprays. The treatment provided mild relief.  Otalgia  There is pain in the left ear. The current episode started 1 to 4 weeks ago. The problem has been gradually worsening. There has been no fever. Associated symptoms include headaches. Pertinent negatives include no coughing, ear discharge, hearing loss, rhinorrhea or sore throat. She has tried NSAIDs for the symptoms. The treatment provided mild relief. There is no history of a chronic ear infection, hearing loss or a tympanostomy tube.    Using emergency, 2 allergy meds, nasal saline rinse.  Zyrtec D every other day.. helps  some.   non smoker  COVID 19 screen No recent travel or known exposure to [place] The patient denies respiratory symptoms of COVID 19 at this time.  The importance of social distancing was discussed today.   Review of Systems  HENT: Positive for ear pain. Negative for congestion, ear discharge, hearing loss, hoarse voice, rhinorrhea and sore throat.   Respiratory: Negative for cough and shortness of breath.   Neurological: Positive for headaches.      Past Medical History:  Diagnosis Date  . Allergy    SEASONAL  . Anemia   . CAD S/P percutaneous coronary angioplasty 09/2013   a) Ostial AV G Cx - 2.5 mm Angiosculpt; mid LAD 40-60%; b) Myoview 07/2014: LOW RISK, small-severe fixed inferior defect c/w infarct w/o peri-infarct ischemia.  . Chronic bronchitis ([place])    "frequently; not q yr" (10/03/2013)  . Diverticulosis   . Essential hypertension    with prior Accelerated HTN  . Hiatal hernia   . Hx of non-ST elevation myocardial infarction (NSTEMI) 10/01/2013   Due to Accelerated HTN with existing CAD  . Hyperlipemia   . Migraine    "@ least once/month" (10/03/2013)  . OSA (obstructive sleep apnea) 06/10/2016  . Schatzki's ring   . Seasonal allergies   . Sinusitis     reports that she has never smoked. She has never used smokeless tobacco. She reports that she does not drink alcohol or use drugs.   Current Outpatient Medications:  .  albuterol (PROVENTIL HFA;VENTOLIN HFA) 108 (90 Base) MCG/ACT inhaler, INHALE 2 PUFFS BY MOUTH EVERY 6 HOURS AS NEEDED FOR WHEEZING OR  SHORTNESS  OF  BREATH, Disp: 18 each, Rfl: 0 .  amLODipine (NORVASC) 5 MG tablet, Take 1 tablet (5 mg total) by mouth daily., Disp: 90 tablet, Rfl: 3 .  aspirin EC 81 MG EC tablet, Take 1 tablet (81 mg total) by mouth daily., Disp: , Rfl:  .  atorvastatin (LIPITOR) 80 MG tablet, Take 1 tablet (80 mg total) by mouth daily., Disp: 90 tablet, Rfl: 3 .  carvedilol (COREG) 25 MG tablet, Take 1 tablet (25 mg total) by mouth 2  (two) times daily with a meal., Disp: 180 tablet, Rfl: 3 .  famotidine (PEPCID) 20 MG tablet, Take 20 mg by mouth 2 (two) times daily., Disp: , Rfl:  .  ibuprofen (ADVIL) 800 MG tablet, Take 1 tablet (800 mg total) by mouth every 8 (eight) hours as needed., Disp: 30 tablet, Rfl: 0 .  isosorbide mononitrate (IMDUR) 60 MG 24 hr tablet, Take 2 tablets (120 mg total) by mouth daily., Disp: 180 tablet, Rfl: 3 .  losartan (COZAAR) 100 MG tablet, Take 1 tablet (100 mg total) by mouth daily., Disp: 90 tablet, Rfl: 3 .  montelukast (SINGULAIR) 10 MG tablet, TAKE 1 TABLET BY MOUTH AT BEDTIME, Disp: 30 tablet, Rfl: 0 .  Multiple Vitamin (MULTIVITAMIN WITH MINERALS) TABS tablet, Take 1 tablet by mouth daily., Disp: , Rfl:  .  Multiple Vitamins-Minerals (EMERGEN-C IMMUNE) PACK, Take 1 packet by mouth daily., Disp: , Rfl:  .  nitroGLYCERIN (NITROSTAT) 0.4 MG SL tablet, Place 1 tablet (0.4 mg total) under the tongue every 5 (five) minutes as needed for chest pain. May take up to 3 tablets. If still having chest pain, call 911 immediately., Disp: 20 tablet, Rfl: 0 .  pantoprazole (PROTONIX) 40 MG tablet, Take 1 tablet (40 mg total) by mouth daily., Disp: 90 tablet, Rfl: 3   Observations/Objective: Height 5' 8.75" (1.746 m).  Physical Exam  Physical Exam Constitutional:      General: The patient is not in acute distress. Pulmonary:     Effort: Pulmonary effort is normal. No respiratory distress.  Neurological:     Mental Status: The patient is alert and oriented to person, place, and time.  Psychiatric:        Mood and Affect: Mood normal.        Behavior: Behavior normal.   Assessment and Plan Acute sinus infection Given > 2 weeks of symptoms... possible bacterial infection on top of allergies. Treat with  antibiotisc.. continue antihistamines and saline.  Ear pain, left ETD and fluid in ear vs bacterial infection... treat with antibiotics given unable to examine ear and  Start nasal steroid.      I discussed the assessment and treatment plan with the patient. The patient was provided an opportunity to ask questions and all were answered. The patient agreed with the plan and demonstrated an understanding of the instructions.   The patient was advised to call back or seek an in-person evaluation if the symptoms worsen or if the condition fails to improve as anticipated.     Christine Lofts, MD

## 2019-03-30 NOTE — Assessment & Plan Note (Signed)
ETD and fluid in ear vs bacterial infection... treat with antibiotics given unable to examine ear and  Start nasal steroid.

## 2019-03-30 NOTE — Patient Instructions (Signed)
Acute sinus infection Given > 2 weeks of symptoms... possible bacterial infection on top of allergies. Treat with  antibiotisc.. continue antihistamines and saline.  Ear pain, left ETD and fluid in ear vs bacterial infection... treat with antibiotics given unable to examine ear and  Start nasal steroid.

## 2019-04-03 ENCOUNTER — Other Ambulatory Visit: Payer: Self-pay | Admitting: Family Medicine

## 2019-04-03 DIAGNOSIS — R0981 Nasal congestion: Secondary | ICD-10-CM

## 2019-04-03 DIAGNOSIS — J301 Allergic rhinitis due to pollen: Secondary | ICD-10-CM

## 2019-05-05 ENCOUNTER — Telehealth: Payer: Self-pay | Admitting: Family Medicine

## 2019-05-05 DIAGNOSIS — R7303 Prediabetes: Secondary | ICD-10-CM

## 2019-05-05 DIAGNOSIS — Z1159 Encounter for screening for other viral diseases: Secondary | ICD-10-CM

## 2019-05-05 DIAGNOSIS — E785 Hyperlipidemia, unspecified: Secondary | ICD-10-CM

## 2019-05-05 NOTE — Telephone Encounter (Signed)
-----   Message from Ellamae Sia sent at 05/03/2019 11:00 AM EST ----- Regarding: Lab orders for Monday, 11.9.20 Patient is scheduled for CPX labs, please order future labs, Thanks , Karna Christmas

## 2019-05-08 ENCOUNTER — Other Ambulatory Visit: Payer: Self-pay

## 2019-05-08 ENCOUNTER — Other Ambulatory Visit (INDEPENDENT_AMBULATORY_CARE_PROVIDER_SITE_OTHER): Payer: Self-pay

## 2019-05-08 DIAGNOSIS — Z1159 Encounter for screening for other viral diseases: Secondary | ICD-10-CM

## 2019-05-08 DIAGNOSIS — R7303 Prediabetes: Secondary | ICD-10-CM

## 2019-05-08 DIAGNOSIS — E785 Hyperlipidemia, unspecified: Secondary | ICD-10-CM

## 2019-05-08 NOTE — Addendum Note (Signed)
Addended by: Cloyd Stagers on: 05/08/2019 08:16 AM   Modules accepted: Orders

## 2019-05-09 LAB — COMPREHENSIVE METABOLIC PANEL
AG Ratio: 1.6 (calc) (ref 1.0–2.5)
ALT: 17 U/L (ref 6–29)
AST: 19 U/L (ref 10–35)
Albumin: 4.3 g/dL (ref 3.6–5.1)
Alkaline phosphatase (APISO): 73 U/L (ref 37–153)
BUN: 9 mg/dL (ref 7–25)
CO2: 18 mmol/L — ABNORMAL LOW (ref 20–32)
Calcium: 9.7 mg/dL (ref 8.6–10.4)
Chloride: 107 mmol/L (ref 98–110)
Creat: 0.77 mg/dL (ref 0.50–1.05)
Globulin: 2.7 g/dL (calc) (ref 1.9–3.7)
Glucose, Bld: 85 mg/dL (ref 65–99)
Potassium: 3.6 mmol/L (ref 3.5–5.3)
Sodium: 141 mmol/L (ref 135–146)
Total Bilirubin: 0.9 mg/dL (ref 0.2–1.2)
Total Protein: 7 g/dL (ref 6.1–8.1)

## 2019-05-09 LAB — HEPATITIS C ANTIBODY
Hepatitis C Ab: NONREACTIVE
SIGNAL TO CUT-OFF: 0.18 (ref <1.00)

## 2019-05-09 LAB — LIPID PANEL
Cholesterol: 222 mg/dL — ABNORMAL HIGH (ref <200)
HDL: 49 mg/dL — ABNORMAL LOW (ref >=50)
LDL Cholesterol (Calc): 151 mg/dL (calc) — ABNORMAL HIGH
Non-HDL Cholesterol (Calc): 173 mg/dL (calc) — ABNORMAL HIGH (ref <130)
Total CHOL/HDL Ratio: 4.5 (calc) (ref <5.0)
Triglycerides: 105 mg/dL (ref <150)

## 2019-05-09 LAB — HEMOGLOBIN A1C
Hgb A1c MFr Bld: 5.9 % of total Hgb — ABNORMAL HIGH (ref <5.7)
Mean Plasma Glucose: 123 (calc)
eAG (mmol/L): 6.8 (calc)

## 2019-05-09 NOTE — Progress Notes (Signed)
No critical labs need to be addressed urgently. We will discuss labs in detail at upcoming office visit.   

## 2019-05-12 ENCOUNTER — Encounter: Payer: Self-pay | Admitting: Family Medicine

## 2019-05-12 ENCOUNTER — Ambulatory Visit (INDEPENDENT_AMBULATORY_CARE_PROVIDER_SITE_OTHER): Payer: Self-pay | Admitting: Family Medicine

## 2019-05-12 ENCOUNTER — Other Ambulatory Visit: Payer: Self-pay

## 2019-05-12 VITALS — BP 142/92 | HR 79 | Temp 98.0°F | Ht 69.0 in | Wt 269.0 lb

## 2019-05-12 DIAGNOSIS — Z0000 Encounter for general adult medical examination without abnormal findings: Secondary | ICD-10-CM

## 2019-05-12 DIAGNOSIS — E785 Hyperlipidemia, unspecified: Secondary | ICD-10-CM

## 2019-05-12 DIAGNOSIS — R7303 Prediabetes: Secondary | ICD-10-CM

## 2019-05-12 DIAGNOSIS — I10 Essential (primary) hypertension: Secondary | ICD-10-CM

## 2019-05-12 MED ORDER — EZETIMIBE 10 MG PO TABS: 10.0000 mg | ORAL_TABLET | Freq: Every day | ORAL | 11 refills | Status: DC

## 2019-05-12 NOTE — Assessment & Plan Note (Signed)
Add zetia to atorvastatin 80 mg every other day.. Encouraged exercise, weight loss, healthy eating habits. Re-eval in 3 months.

## 2019-05-12 NOTE — Patient Instructions (Addendum)
Continue atorvastatin every other day.  Start zetia daily for cholesterol.  Keep up great work on healthy eating and weight loss.. try to add exercsie 3-5 days a week.

## 2019-05-12 NOTE — Assessment & Plan Note (Signed)
Continue meds.. continue work on weight loss.

## 2019-05-12 NOTE — Progress Notes (Signed)
Chief Complaint  Patient presents with  . Annual Exam    History of Present Illness: HPI The patient is here for annual wellness exam and preventative care.    Hypertension:   Improved control back on on amlodipine, carvedilol, losartan, imdur BP Readings from Last 3 Encounters:  05/12/19 (!) 142/92  02/24/19 (!) 150/90  05/03/18 (!) 160/86  Using medication without problems or lightheadedness: none Chest pain with exertion:none Edema:none Short of breath:none Average home BPs: not checking..needs new cuff Other issues:CAD on isosorbide, ASA followed by Cards   She has  been doing a full body cleanse  Wt Readings from Last 3 Encounters:  05/12/19 269 lb (122 kg)  02/24/19 282 lb 8 oz (128.1 kg)  05/03/18 288 lb (130.6 kg)     Elevated Cholesterol:  Not at goal.. using atorvastatin 80 mg every other day. Lab Results  Component Value Date   CHOL 222 (H) 05/08/2019   HDL 49 (L) 05/08/2019   LDLCALC 151 (H) 05/08/2019   LDLDIRECT 192.2 02/11/2009   TRIG 105 05/08/2019   CHOLHDL 4.5 05/08/2019  Using medications without problems: Muscle aches:  Diet compliance: improved overall Exercise: off and on Other complaints:   prediabetes improved A1C in last year.   COVID 19 screen No recent travel or known exposure to COVID19 The patient denies respiratory symptoms of COVID 19 at this time.  The importance of social distancing was discussed today.   Review of Systems  Constitutional: Negative for chills and fever.  HENT: Negative for congestion and ear pain.   Eyes: Negative for pain and redness.  Respiratory: Negative for cough and shortness of breath.   Cardiovascular: Negative for chest pain, palpitations and leg swelling.  Gastrointestinal: Negative for abdominal pain, blood in stool, constipation, diarrhea, nausea and vomiting.  Genitourinary: Negative for dysuria.  Musculoskeletal: Negative for falls and myalgias.  Skin: Negative for rash.  Neurological:  Negative for dizziness.  Psychiatric/Behavioral: Negative for depression. The patient is not nervous/anxious.       Past Medical History:  Diagnosis Date  . Allergy    SEASONAL  . Anemia   . CAD S/P percutaneous coronary angioplasty 09/2013   a) Ostial AV G Cx - 2.5 mm Angiosculpt; mid LAD 40-60%; b) Myoview 07/2014: LOW RISK, small-severe fixed inferior defect c/w infarct w/o peri-infarct ischemia.  . Chronic bronchitis ([place])    "frequently; not q yr" (10/03/2013)  . Diverticulosis   . Essential hypertension    with prior Accelerated HTN  . Hiatal hernia   . Hx of non-ST elevation myocardial infarction (NSTEMI) 10/01/2013   Due to Accelerated HTN with existing CAD  . Hyperlipemia   . Migraine    "@ least once/month" (10/03/2013)  . OSA (obstructive sleep apnea) 06/10/2016  . Schatzki's ring   . Seasonal allergies   . Sinusitis     reports that she has never smoked. She has never used smokeless tobacco. She reports that she does not drink alcohol or use drugs.   Current Outpatient Medications:  .  albuterol (PROVENTIL HFA;VENTOLIN HFA) 108 (90 Base) MCG/ACT inhaler, INHALE 2 PUFFS BY MOUTH EVERY 6 HOURS AS NEEDED FOR WHEEZING OR  SHORTNESS  OF  BREATH, Disp: 18 each, Rfl: 0 .  amLODipine (NORVASC) 5 MG tablet, Take 1 tablet (5 mg total) by mouth daily., Disp: 90 tablet, Rfl: 3 .  aspirin EC 81 MG EC tablet, Take 1 tablet (81 mg total) by mouth daily., Disp: , Rfl:  .  atorvastatin (LIPITOR)  80 MG tablet, Take 1 tablet (80 mg total) by mouth daily., Disp: 90 tablet, Rfl: 3 .  carvedilol (COREG) 25 MG tablet, Take 1 tablet (25 mg total) by mouth 2 (two) times daily with a meal., Disp: 180 tablet, Rfl: 3 .  famotidine (PEPCID) 20 MG tablet, Take 20 mg by mouth 2 (two) times daily., Disp: , Rfl:  .  fluticasone (FLONASE) 50 MCG/ACT nasal spray, Place 2 sprays into both nostrils daily., Disp: 16 g, Rfl: 0 .  ibuprofen (ADVIL) 800 MG tablet, Take 1 tablet (800 mg total) by mouth every 8  (eight) hours as needed., Disp: 30 tablet, Rfl: 0 .  isosorbide mononitrate (IMDUR) 60 MG 24 hr tablet, Take 2 tablets (120 mg total) by mouth daily., Disp: 180 tablet, Rfl: 3 .  losartan (COZAAR) 100 MG tablet, Take 1 tablet (100 mg total) by mouth daily., Disp: 90 tablet, Rfl: 3 .  montelukast (SINGULAIR) 10 MG tablet, TAKE 1 TABLET BY MOUTH AT BEDTIME, Disp: 90 tablet, Rfl: 3 .  Multiple Vitamin (MULTIVITAMIN WITH MINERALS) TABS tablet, Take 1 tablet by mouth daily., Disp: , Rfl:  .  Multiple Vitamins-Minerals (EMERGEN-C IMMUNE) PACK, Take 1 packet by mouth daily., Disp: , Rfl:  .  nitroGLYCERIN (NITROSTAT) 0.4 MG SL tablet, Place 1 tablet (0.4 mg total) under the tongue every 5 (five) minutes as needed for chest pain. May take up to 3 tablets. If still having chest pain, call 911 immediately., Disp: 20 tablet, Rfl: 0 .  pantoprazole (PROTONIX) 40 MG tablet, Take 1 tablet (40 mg total) by mouth daily., Disp: 90 tablet, Rfl: 3   Observations/Objective: Blood pressure (!) 142/92, pulse 79, temperature 98 F (36.7 C), temperature source Temporal, height 5\' 9"  (1.753 m), weight 269 lb (122 kg), SpO2 98 %.  Physical Exam Constitutional:      General: She is not in acute distress.    Appearance: Normal appearance. She is well-developed. She is not ill-appearing or toxic-appearing.  HENT:     Head: Normocephalic.     Right Ear: Hearing, tympanic membrane, ear canal and external ear normal. Tympanic membrane is not erythematous, retracted or bulging.     Left Ear: Hearing, tympanic membrane, ear canal and external ear normal. Tympanic membrane is not erythematous, retracted or bulging.     Nose: No mucosal edema or rhinorrhea.     Right Sinus: No maxillary sinus tenderness or frontal sinus tenderness.     Left Sinus: No maxillary sinus tenderness or frontal sinus tenderness.     Mouth/Throat:     Pharynx: Uvula midline.  Eyes:     General: Lids are normal. Lids are everted, no foreign bodies  appreciated.     Conjunctiva/sclera: Conjunctivae normal.     Pupils: Pupils are equal, round, and reactive to light.  Neck:     Musculoskeletal: Normal range of motion and neck supple.     Thyroid: No thyroid mass or thyromegaly.     Vascular: No carotid bruit.     Trachea: Trachea normal.  Cardiovascular:     Rate and Rhythm: Normal rate and regular rhythm.     Pulses: Normal pulses.     Heart sounds: Normal heart sounds, S1 normal and S2 normal. No murmur. No friction rub. No gallop.   Pulmonary:     Effort: Pulmonary effort is normal. No tachypnea or respiratory distress.     Breath sounds: Normal breath sounds. No decreased breath sounds, wheezing, rhonchi or rales.  Chest:  Breasts: Breasts are symmetrical.        Right: Normal. No mass.        Left: Normal. No mass.  Abdominal:     General: Bowel sounds are normal.     Palpations: Abdomen is soft.     Tenderness: There is no abdominal tenderness.  Lymphadenopathy:     Upper Body:     Right upper body: No supraclavicular, axillary or pectoral adenopathy.     Left upper body: No supraclavicular, axillary or pectoral adenopathy.  Skin:    General: Skin is warm and dry.     Findings: No rash.  Neurological:     Mental Status: She is alert.  Psychiatric:        Mood and Affect: Mood is not anxious or depressed.        Speech: Speech normal.        Behavior: Behavior normal. Behavior is cooperative.        Thought Content: Thought content normal.        Judgment: Judgment normal.      Assessment and Plan   The patient's preventative maintenance and recommended screening tests for an annual wellness exam were reviewed in full today. Brought up to date unless services declined.  Counselled on the importance of diet, exercise, and its role in overall health and mortality. The patient's FH and SH was reviewed, including their home life, tobacco status, and drug and alcohol status.   Hep C screening neg 2020  Uptodate  with tdap and flu  Last pap 2015 neg PAP and HPV neg... Partial hysterectomy, no family history of overian cancer.  mammogram 08/2018,  Sister with breast cancer.  colon: 10/2014 repeat in 10 years Dr. Odetta Pink, MD

## 2019-05-12 NOTE — Assessment & Plan Note (Signed)
Improved with lifestyle changes 

## 2019-05-16 ENCOUNTER — Other Ambulatory Visit: Payer: Self-pay | Admitting: Family Medicine

## 2019-05-16 DIAGNOSIS — R1310 Dysphagia, unspecified: Secondary | ICD-10-CM

## 2019-06-28 ENCOUNTER — Encounter: Payer: Self-pay | Admitting: Family Medicine

## 2019-06-28 ENCOUNTER — Ambulatory Visit (INDEPENDENT_AMBULATORY_CARE_PROVIDER_SITE_OTHER): Payer: HRSA Program | Admitting: Family Medicine

## 2019-06-28 ENCOUNTER — Telehealth: Payer: Self-pay

## 2019-06-28 VITALS — BP 174/101 | Ht 69.0 in | Wt 266.0 lb

## 2019-06-28 DIAGNOSIS — U071 COVID-19: Secondary | ICD-10-CM

## 2019-06-28 MED ORDER — GUAIFENESIN-CODEINE 100-10 MG/5ML PO SYRP: 5.0000 mL | ORAL_SOLUTION | Freq: Every evening | ORAL | 0 refills | Status: DC | PRN

## 2019-06-28 NOTE — Telephone Encounter (Signed)
[place] Night - Client TELEPHONE ADVICE RECORD [facility] Patient Name: Christine Barrett Gender: Female DOB: March 19, 1961 Age: 58 Y 28 M 6 D Return Phone Number: [phone] (Primary) Address: City/State/Zip: [place] [address] Client [facility] Night - Client Client Site [place] Physician Eliezer Lofts - MD Contact Type Call Who Is Calling Patient / Member / Family / Caregiver Call Type Triage / Clinical Relationship To Patient Self Return Phone Number [phone] (Primary) Chief Complaint Headache Reason for Call Request to Schedule Office Appointment Initial Comment Caller states she would like to see about making an appointment. States she has a upper respiratory sinus infection. States she has a sinus pressure headache that she is unable to get rid of with sinus drainage. She has a cough as well. [place] Not Listed Going to call [facility] [place] Translation No Nurse Assessment Nurse: Rolin Barry, RN, Levada Dy Date/Time (Eastern Time): 06/27/2019 5:57:46 PM Confirm and document reason for call. If symptomatic, describe symptoms. ---Caller advised that she started in with sinus pain and pressure on Saturday and has a dry cough at this time. No temp. Drainage in her throat, yellow drainage. Non-productive cough. Has the patient had close contact with a person known or suspected to have the novel coronavirus illness OR traveled / lives in area with major community spread (including international travel) in the last 14 days from the onset of symptoms? * If Asymptomatic, screen for exposure and travel within the last 14 days. ---Yes Does the patient have any new or worsening symptoms? ---Yes Will a triage be completed? ---Yes Related visit to physician within the last 2 weeks? ---No Does the PT have any chronic conditions? (i.e. diabetes, asthma, this includes High risk factors for pregnancy,  etc.) ---Yes List chronic conditions. ---Hx of MI HTN Is this a behavioral health or substance abuse call? ---No Guidelines Guideline Title Affirmed Question Affirmed Notes Nurse Date/Time (Eastern Time) Coronavirus (COVID-19) - Diagnosed or Suspected MILD difficulty breathing (e.g., minimal/no SOB at [place], RN, Levada Dy 06/27/2019 6:00:14 PM PLEASE NOTE: All timestamps contained within this report are represented as [place] Standard Time. CONFIDENTIALTY NOTICE: This fax transmission is intended only for the addressee. It contains information that is legally privileged, confidential or otherwise protected from use or disclosure. If you are not the intended recipient, you are strictly prohibited from reviewing, disclosing, copying using or disseminating any of this information or taking any action in reliance on or regarding this information. If you have received this fax in error, please notify us immediately by telephone so that we can arrange for its return to [place]. Phone: [phone], Toll-Free: [phone], Fax: [phone] Page: 2 of 2 Call Id: [id number] Guidelines Guideline Title Affirmed Question Affirmed Notes Nurse Date/Time Eilene Ghazi Time) rest, SOB with walking, pulse <100) Disp. Time Eilene Ghazi Time) Disposition Final User 06/27/2019 5:34:52 PM Attempt made - message left [place], RN, Levada Dy 06/27/2019 6:08:15 PM Go to ED Now (or PCP triage) Yes [place], RN, Cindee Lame Disagree/Comply Comply Caller Understands Yes PreDisposition Call Doctor Care Advice Given Per Guideline GO TO ED NOW (OR PCP TRIAGE): * The treatment is the same whether you have COVID-19, influenza or some other respiratory virus. GENERAL CARE ADVICE FOR COVID-19 SYMPTOMS: CARE ADVICE given per CORONAVIRUS (COVID-19) - DIAGNOSED OR SUSPECTED (Adult) guideline.

## 2019-06-28 NOTE — Telephone Encounter (Signed)
Left v/m requesting pt to call [facility] to see if pt was seen at [facility] or needs appt.

## 2019-06-28 NOTE — Telephone Encounter (Signed)
Pt has already had virtual appt with Dr [name] today. Pt said she had rapid covid test today and she is positive; pt has already been advised by Dr [name] what to do and UC&ED precautions given and pt voiced understanding.

## 2019-06-28 NOTE — Progress Notes (Signed)
VIRTUAL VISIT Due to national recommendations of social distancing due to [place] 19, a virtual visit is felt to be most appropriate for this patient at this time.   I connected with the patient on 06/28/19 at 11:40 AM EST by virtual telehealth platform and verified that I am speaking with the correct person using two identifiers.   I discussed the limitations, risks, security and privacy concerns of performing an evaluation and management service by  virtual telehealth platform and the availability of in person appointments. I also discussed with the patient that there may be a patient responsible charge related to this service. The patient expressed understanding and agreed to proceed.  Patient location: Home Provider Location: [place] Participants: Eliezer Lofts and Leana Roe   Chief Complaint  Patient presents with  . Headache  . Cough  . Sinus Pressure  . Sinus Drainage    History of Present Illness: Headache  This is a new problem. The current episode started in the past 7 days. The problem has been gradually worsening. The pain is located in the frontal region. Associated symptoms include coughing, drainage, muscle aches, sinus pressure and a sore throat. Associated symptoms comments:  sweats, body aches , fever  dry cough  post nasal drip   mild SOB, occ wheeze.  Cough Associated symptoms include headaches and a sore throat.    She went this Am for COVID testing.. positive results.   She has been using allergy meds, Emergency, ooc sudafed.   COVID 19 screen No recent travel or known exposure to COVID19 The patient denies respiratory symptoms of COVID 19 at this time.  The importance of social distancing was discussed today.   Review of Systems  HENT: Positive for sinus pressure and sore throat.   Respiratory: Positive for cough.   Neurological: Positive for headaches.      Past Medical History:  Diagnosis Date  . Allergy    SEASONAL  . Anemia   .  CAD S/P percutaneous coronary angioplasty 09/2013   a) Ostial AV G Cx - 2.5 mm Angiosculpt; mid LAD 40-60%; b) Myoview 07/2014: LOW RISK, small-severe fixed inferior defect c/w infarct w/o peri-infarct ischemia.  . Chronic bronchitis ([place])    "frequently; not q yr" (10/03/2013)  . Diverticulosis   . Essential hypertension    with prior Accelerated HTN  . Hiatal hernia   . Hx of non-ST elevation myocardial infarction (NSTEMI) 10/01/2013   Due to Accelerated HTN with existing CAD  . Hyperlipemia   . Migraine    "@ least once/month" (10/03/2013)  . OSA (obstructive sleep apnea) 06/10/2016  . Schatzki's ring   . Seasonal allergies   . Sinusitis     reports that she has never smoked. She has never used smokeless tobacco. She reports that she does not drink alcohol or use drugs.   Current Outpatient Medications:  .  albuterol (PROVENTIL HFA;VENTOLIN HFA) 108 (90 Base) MCG/ACT inhaler, INHALE 2 PUFFS BY MOUTH EVERY 6 HOURS AS NEEDED FOR WHEEZING OR  SHORTNESS  OF  BREATH, Disp: 18 each, Rfl: 0 .  amLODipine (NORVASC) 5 MG tablet, Take 1 tablet (5 mg total) by mouth daily., Disp: 90 tablet, Rfl: 3 .  aspirin EC 81 MG EC tablet, Take 1 tablet (81 mg total) by mouth daily., Disp: , Rfl:  .  atorvastatin (LIPITOR) 80 MG tablet, Take 1 tablet (80 mg total) by mouth daily., Disp: 90 tablet, Rfl: 3 .  carvedilol (COREG) 25 MG tablet, Take 1  tablet (25 mg total) by mouth 2 (two) times daily with a meal., Disp: 180 tablet, Rfl: 3 .  ezetimibe (ZETIA) 10 MG tablet, Take 1 tablet (10 mg total) by mouth daily., Disp: 30 tablet, Rfl: 11 .  famotidine (PEPCID) 20 MG tablet, Take 20 mg by mouth 2 (two) times daily., Disp: , Rfl:  .  fluticasone (FLONASE) 50 MCG/ACT nasal spray, Place 2 sprays into both nostrils daily., Disp: 16 g, Rfl: 0 .  ibuprofen (ADVIL) 800 MG tablet, Take 1 tablet (800 mg total) by mouth every 8 (eight) hours as needed., Disp: 30 tablet, Rfl: 0 .  isosorbide mononitrate (IMDUR) 60 MG 24 hr  tablet, Take 2 tablets (120 mg total) by mouth daily., Disp: 180 tablet, Rfl: 3 .  losartan (COZAAR) 100 MG tablet, Take 1 tablet (100 mg total) by mouth daily., Disp: 90 tablet, Rfl: 3 .  montelukast (SINGULAIR) 10 MG tablet, TAKE 1 TABLET BY MOUTH AT BEDTIME, Disp: 90 tablet, Rfl: 3 .  Multiple Vitamin (MULTIVITAMIN WITH MINERALS) TABS tablet, Take 1 tablet by mouth daily., Disp: , Rfl:  .  Multiple Vitamins-Minerals (EMERGEN-C IMMUNE) PACK, Take 1 packet by mouth daily., Disp: , Rfl:  .  nitroGLYCERIN (NITROSTAT) 0.4 MG SL tablet, Place 1 tablet (0.4 mg total) under the tongue every 5 (five) minutes as needed for chest pain. May take up to 3 tablets. If still having chest pain, call 911 immediately., Disp: 20 tablet, Rfl: 0 .  pantoprazole (PROTONIX) 40 MG tablet, Take 1 tablet by mouth once daily, Disp: 90 tablet, Rfl: 3   Observations/Objective: Blood pressure (!) 174/101, height 5\' 9"  (1.753 m), weight 266 lb (120.7 kg).  Physical Exam  Physical Exam Constitutional:      General: The patient is not in acute distress. Pulmonary:     Effort: Pulmonary effort is normal. No respiratory distress.  Neurological:     Mental Status: The patient is alert and oriented to person, place, and time.  Psychiatric:        Mood and Affect: Mood normal.        Behavior: Behavior normal.   Assessment and Plan    I discussed the assessment and treatment plan with the patient. The patient was provided an opportunity to ask questions and all were answered. The patient agreed with the plan and demonstrated an understanding of the instructions.   The patient was advised to call back or seek an in-person evaluation if the symptoms worsen or if the condition fails to improve as anticipated.     Eliezer Lofts, MD

## 2019-06-28 NOTE — Assessment & Plan Note (Addendum)
Reviewed risk level, home care, and ER precautions. Rx for cough suppressant sent in.  Questions answered and pt set up for Mychart COVID monitoring.

## 2019-07-14 NOTE — Telephone Encounter (Signed)
Pt left v/m requesting cb about my chart message sent on 07/12/19. Pt request cb today.

## 2019-08-09 ENCOUNTER — Telehealth: Payer: Self-pay | Admitting: Family Medicine

## 2019-08-09 DIAGNOSIS — E785 Hyperlipidemia, unspecified: Secondary | ICD-10-CM

## 2019-08-09 DIAGNOSIS — R7303 Prediabetes: Secondary | ICD-10-CM

## 2019-08-09 NOTE — Telephone Encounter (Signed)
-----   Message from Ellamae Sia sent at 07/28/2019 12:14 PM EST ----- Regarding: Lab orders for Friday, 2.11.21 Lab orders for a f/u

## 2019-08-11 ENCOUNTER — Other Ambulatory Visit: Payer: Self-pay

## 2019-09-05 LAB — HM MAMMOGRAPHY

## 2019-09-08 ENCOUNTER — Encounter: Payer: Self-pay | Admitting: Family Medicine

## 2019-09-11 ENCOUNTER — Ambulatory Visit: Payer: Self-pay | Attending: Internal Medicine

## 2019-09-11 DIAGNOSIS — Z23 Encounter for immunization: Secondary | ICD-10-CM

## 2019-09-11 NOTE — Progress Notes (Signed)
   Covid-19 Vaccination Clinic  Name:  Christine Barrett    MRN: [id number] DOB: 1960-12-14  09/11/2019  Ms. Christine Barrett was observed post Covid-19 immunization for 15 minutes without incident. She was provided with Vaccine Information Sheet and instruction to access the V-Safe system.   Ms. Christine Barrett was instructed to call 911 with any severe reactions post vaccine: [name] Difficulty breathing  . Swelling of face and throat  . A fast heartbeat  . A bad rash all over body  . Dizziness and weakness   Immunizations Administered    Name Date Dose VIS Date Route   Pfizer COVID-19 Vaccine 09/11/2019  3:21 PM 0.3 mL 06/09/2019 Intramuscular   Manufacturer: [place]   Lot: [id number]   [place]: [id number]

## 2019-09-25 ENCOUNTER — Other Ambulatory Visit: Payer: Self-pay

## 2019-09-25 MED ORDER — IBUPROFEN 800 MG PO TABS: 800.0000 mg | ORAL_TABLET | Freq: Three times a day (TID) | ORAL | 0 refills | Status: DC | PRN

## 2019-09-25 NOTE — Telephone Encounter (Signed)
Last office visit 06/28/2019 for Covid 19. Last refilled 02/24/2019 for #30 with no refills.  CPE scheduled for 05/17/2020.

## 2019-10-09 ENCOUNTER — Ambulatory Visit: Payer: Self-pay | Attending: Internal Medicine

## 2019-10-09 DIAGNOSIS — Z23 Encounter for immunization: Secondary | ICD-10-CM

## 2019-10-09 NOTE — Progress Notes (Signed)
   Covid-19 Vaccination Clinic  Name:  Christine Barrett    MRN: [id number] DOB: 06/11/1961  10/09/2019  Christine Barrett was observed post Covid-19 immunization for 15 minutes without incident. She was provided with Vaccine Information Sheet and instruction to access the V-Safe system.   Christine Barrett was instructed to call 911 with any severe reactions post vaccine: [name] Difficulty breathing  . Swelling of face and throat  . A fast heartbeat  . A bad rash all over body  . Dizziness and weakness   Immunizations Administered    Name Date Dose VIS Date Route   Pfizer COVID-19 Vaccine 10/09/2019  9:56 AM 0.3 mL 06/09/2019 Intramuscular   Manufacturer: [place]   Lot: [id number]   [place]: [id number]

## 2019-11-04 ENCOUNTER — Emergency Department (HOSPITAL_BASED_OUTPATIENT_CLINIC_OR_DEPARTMENT_OTHER)
Admission: EM | Admit: 2019-11-04 | Discharge: 2019-11-04 | Disposition: A | Discharge status: Home / Self Care | Payer: Self-pay | Attending: Emergency Medicine | Admitting: Emergency Medicine

## 2019-11-04 ENCOUNTER — Emergency Department (HOSPITAL_BASED_OUTPATIENT_CLINIC_OR_DEPARTMENT_OTHER): Payer: Self-pay

## 2019-11-04 ENCOUNTER — Other Ambulatory Visit: Payer: Self-pay

## 2019-11-04 ENCOUNTER — Encounter (HOSPITAL_BASED_OUTPATIENT_CLINIC_OR_DEPARTMENT_OTHER): Payer: Self-pay

## 2019-11-04 DIAGNOSIS — Y929 Unspecified place or not applicable: Secondary | ICD-10-CM | POA: Insufficient documentation

## 2019-11-04 DIAGNOSIS — I10 Essential (primary) hypertension: Secondary | ICD-10-CM | POA: Insufficient documentation

## 2019-11-04 DIAGNOSIS — W010XXA Fall on same level from slipping, tripping and stumbling without subsequent striking against object, initial encounter: Secondary | ICD-10-CM | POA: Insufficient documentation

## 2019-11-04 DIAGNOSIS — S8002XA Contusion of left knee, initial encounter: Secondary | ICD-10-CM | POA: Insufficient documentation

## 2019-11-04 DIAGNOSIS — S0083XA Contusion of other part of head, initial encounter: Secondary | ICD-10-CM | POA: Insufficient documentation

## 2019-11-04 DIAGNOSIS — Z7982 Long term (current) use of aspirin: Secondary | ICD-10-CM | POA: Insufficient documentation

## 2019-11-04 DIAGNOSIS — M25511 Pain in right shoulder: Secondary | ICD-10-CM | POA: Insufficient documentation

## 2019-11-04 DIAGNOSIS — W19XXXA Unspecified fall, initial encounter: Secondary | ICD-10-CM

## 2019-11-04 DIAGNOSIS — Y9301 Activity, walking, marching and hiking: Secondary | ICD-10-CM | POA: Insufficient documentation

## 2019-11-04 DIAGNOSIS — Z8616 Personal history of COVID-19: Secondary | ICD-10-CM | POA: Insufficient documentation

## 2019-11-04 DIAGNOSIS — Z79899 Other long term (current) drug therapy: Secondary | ICD-10-CM | POA: Insufficient documentation

## 2019-11-04 DIAGNOSIS — I2510 Atherosclerotic heart disease of native coronary artery without angina pectoris: Secondary | ICD-10-CM | POA: Insufficient documentation

## 2019-11-04 DIAGNOSIS — M25531 Pain in right wrist: Secondary | ICD-10-CM | POA: Insufficient documentation

## 2019-11-04 DIAGNOSIS — Y999 Unspecified external cause status: Secondary | ICD-10-CM | POA: Insufficient documentation

## 2019-11-04 HISTORY — DX: Gastro-esophageal reflux disease without esophagitis: K21.9

## 2019-11-04 NOTE — ED Provider Notes (Signed)
[facility] EMERGENCY DEPARTMENT Provider Note   CSN: [id number] Arrival date & time: 11/04/19  1258     History Chief Complaint  Patient presents with  . Christine Barrett is a 59 y.o. female.  Patient presents to the emergency department after a fall occurring late last night.  She states that her left knee buckled on her while she was walking forward causing her to fall forward.  She landed on her left knee and onto an outstretched right arm.  She struck her right eyebrow on the ground.  No loss of consciousness at the time of the fall.  Since the injury she has not had any confusion, neck pain, vomiting, vision changes.  She presents today for evaluation of her pain.  Her worst pain is currently in her right wrist.  Pain is worse with movement and palpation.        Past Medical History:  Diagnosis Date  . Allergy    SEASONAL  . Anemia   . CAD S/P percutaneous coronary angioplasty 09/2013   a) Ostial AV G Cx - 2.5 mm Angiosculpt; mid LAD 40-60%; b) Myoview 07/2014: LOW RISK, small-severe fixed inferior defect c/w infarct w/o peri-infarct ischemia.  . Chronic bronchitis ([place])    "frequently; not q yr" (10/03/2013)  . Diverticulosis   . Essential hypertension    with prior Accelerated HTN  . GERD (gastroesophageal reflux disease)   . Hiatal hernia   . Hx of non-ST elevation myocardial infarction (NSTEMI) 10/01/2013   Due to Accelerated HTN with existing CAD  . Hyperlipemia   . Migraine    "@ least once/month" (10/03/2013)  . OSA (obstructive sleep apnea) 06/10/2016  . Schatzki's ring   . Seasonal allergies   . Sinusitis     Patient Active Problem List   Diagnosis Date Noted  . COVID-19 virus infection 06/28/2019  . Ear pain, left 03/30/2019  . Nausea 02/08/2018  . Left anterior knee pain 08/28/2016  . OSA (obstructive sleep apnea) 06/10/2016  . Costochondritis 06/09/2016  . Prediabetes 05/25/2016  . 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 Essential  hypertension   . GERD (gastroesophageal reflux disease) 08/10/2014  . Menopausal syndrome 08/10/2014  . Accelerated hypertension with heart disease and without congestive heart failure 10/03/2013  . CAD S/P percutaneous coronary angioplasty - Ostial AVG Cx - Scoring balloon PTCA 10/03/2013  . Hyperlipidemia with target LDL less than 70 10/02/2013  . H/O non-ST elevation myocardial infarction (NSTEMI) 10/02/2013  . Normocytic anemia, not due to blood loss   . CARPAL TUNNEL SYNDROME 09/02/2007  . Allergic rhinitis 03/17/2007  . MIGRAINE, COMMON W/O INTRACTABLE MIGRAINE 03/16/2007    Past Surgical History:  Procedure Laterality Date  . ABDOMINAL HYSTERECTOMY  1994   "partial"  . CARDIAC CATHETERIZATION N/A 08/05/2015   Procedure: Left Heart Cath and Coronary Angiography;  Surgeon: [name], MD;  Location: [facility];  Service: Cardiovascular;  Laterality: N/A;  . CESAREAN SECTION  1989  . COLONOSCOPY    . CORONARY ANGIOPLASTY  10/03/2013   95% ostial AV G Cx - 2.5 mm AngioSculpt Balloon PTCA; mid LAD 40-60%  . LEFT HEART CATHETERIZATION WITH CORONARY ANGIOGRAM N/A 10/02/2013   Procedure: LEFT HEART CATHETERIZATION WITH CORONARY ANGIOGRAM;  Surgeon: [name], MD;  Location: [facility] CATH LAB;  Service: Cardiovascular;  Laterality: N/A;  . NASAL SEPTOPLASTY W/ TURBINOPLASTY  ~ 2007  . NM MYOVIEW LTD  08/22/2014    Low risk stress nuclear  study with a small, severe, fixed defect in the distal inferior wall/apex suggestive of small prior infarct; no ischemia.  LV Wall Motion: NL LV Function; NL Wall Motion  . PERCUTANEOUS CORONARY STENT INTERVENTION (PCI-S) N/A 10/03/2013   cutting balloon angioplasty only no stent.   . TRANSTHORACIC ECHOCARDIOGRAM  10/02/2013   EF 55-60%; mild LVH, no RWMA,   . TUBAL LIGATION  1989     OB History   No obstetric history on file.     Family History  Adopted: Yes  Problem Relation Age of Onset  . Diabetes Mother   . Breast cancer Sister   .  Hypertension Sister   . Colon cancer Neg Hx   . Heart attack Neg Hx   . Stroke Neg Hx     Social History   Tobacco Use  . Smoking status: Never Smoker  . Smokeless tobacco: Never Used  Substance Use Topics  . Alcohol use: Yes    Alcohol/week: 0.0 standard drinks    Comment: occ  . Drug use: No    Home Medications Prior to Admission medications   Medication Sig Start Date End Date Taking? Authorizing Provider  albuterol (PROVENTIL HFA;VENTOLIN HFA) 108 (90 Base) MCG/ACT inhaler INHALE 2 PUFFS BY MOUTH EVERY 6 HOURS AS NEEDED FOR WHEEZING OR  SHORTNESS  OF  BREATH 02/04/18   [name], [name], MD  amLODipine (NORVASC) 5 MG tablet Take 1 tablet (5 mg total) by mouth daily. 02/24/19   [name], [name], MD  aspirin EC 81 MG EC tablet Take 1 tablet (81 mg total) by mouth daily. 10/04/13   Geradine Girt, DO  atorvastatin (LIPITOR) 80 MG tablet Take 1 tablet (80 mg total) by mouth daily. 01/07/18   [name], [name], MD  carvedilol (COREG) 25 MG tablet Take 1 tablet (25 mg total) by mouth 2 (two) times daily with a meal. 02/24/19   [name], [name], MD  ezetimibe (ZETIA) 10 MG tablet Take 1 tablet (10 mg total) by mouth daily. 05/12/19   [name], [name], MD  famotidine (PEPCID) 20 MG tablet Take 20 mg by mouth 2 (two) times daily.    [provider]  fluticasone (FLONASE) 50 MCG/ACT nasal spray Place 2 sprays into both nostrils daily. 03/30/19   [name], [name], MD  guaiFENesin-codeine (ROBITUSSIN AC) 100-10 MG/5ML syrup Take 5-10 mLs by mouth at bedtime as needed for cough. 06/28/19   [name], [name], MD  ibuprofen (ADVIL) 800 MG tablet Take 1 tablet (800 mg total) by mouth every 8 (eight) hours as needed. 09/25/19   [name], [name], MD  isosorbide mononitrate (IMDUR) 60 MG 24 hr tablet Take 2 tablets (120 mg total) by mouth daily. 02/24/19   [name], [name], MD  losartan (COZAAR) 100 MG tablet Take 1 tablet (100 mg total) by mouth daily. 02/24/19   [name], [name], MD  montelukast (SINGULAIR) 10 MG tablet  TAKE 1 TABLET BY MOUTH AT BEDTIME 04/03/19   [name], [name], MD  Multiple Vitamin (MULTIVITAMIN WITH MINERALS) TABS tablet Take 1 tablet by mouth daily.    [provider]  Multiple Vitamins-Minerals (EMERGEN-C IMMUNE) PACK Take 1 packet by mouth daily.    [provider]  nitroGLYCERIN (NITROSTAT) 0.4 MG SL tablet Place 1 tablet (0.4 mg total) under the tongue every 5 (five) minutes as needed for chest pain. May take up to 3 tablets. If still having chest pain, call 911 immediately. 06/02/16   Pyrtle, Lajuan Lines, MD  pantoprazole (PROTONIX) 40 MG tablet Take 1 tablet by mouth once daily 05/16/19   [name], [name], MD    Allergies    Penicillins  Review of Systems   Review of Systems  Constitutional: Negative for fatigue.  HENT: Negative for hearing loss.   Eyes: Negative for photophobia, pain and visual disturbance.  Respiratory: Negative for shortness of breath.   Cardiovascular: Negative for chest pain.  Gastrointestinal: Negative for nausea and vomiting.  Musculoskeletal: Positive for arthralgias and myalgias. Negative for back pain, gait problem, joint swelling and neck pain.  Skin: Positive for wound (abrasion).  Neurological: Positive for headaches (mild). Negative for dizziness, weakness, light-headedness and numbness.  Psychiatric/Behavioral: Negative for confusion and decreased concentration.    Physical Exam Updated Vital Signs BP (!) 156/80 (BP Location: Left Arm)   Pulse 74   Temp 97.9 F (36.6 C) (Oral)   Resp 16   Ht 5\' 9"  (1.753 m)   Wt 120.2 kg   SpO2 95%   BMI 39.13 kg/m   Physical Exam Vitals and nursing note reviewed.  Constitutional:      Appearance: She is well-developed.  HENT:     Head: Normocephalic. No raccoon eyes or Battle's sign.     Right Ear: Tympanic membrane, ear canal and external ear normal. No hemotympanum.     Left Ear: Tympanic membrane, ear canal and external ear normal. No hemotympanum.     Nose: Nose normal.      Mouth/Throat:     Pharynx: Uvula midline.  Eyes:     General: Lids are normal.     Extraocular Movements:     Right eye: No nystagmus.     Left eye: No nystagmus.     Conjunctiva/sclera: Conjunctivae normal.     Pupils: Pupils are equal, round, and reactive to light.     Comments: No visible hyphema noted.  Swelling noted to the right upper eyelid with mild ecchymosis.  There is an abrasion above the right eyebrow with some mild surrounding ecchymosis.  No deep laceration.  Patient tender to palpation over the superior orbital rim.  Cardiovascular:     Rate and Rhythm: Normal rate and regular rhythm.  Pulmonary:     Effort: Pulmonary effort is normal.     Breath sounds: Normal breath sounds.  Abdominal:     Palpations: Abdomen is soft.     Tenderness: There is no abdominal tenderness.  Musculoskeletal:     Right shoulder: Tenderness (Anterior) present. No bony tenderness. Decreased range of motion.     Right upper arm: Normal.     Right elbow: No swelling. Normal range of motion. Tenderness present.     Right forearm: Tenderness present. No swelling, edema or bony tenderness.     Right wrist: Tenderness, bony tenderness and snuff box tenderness present. No swelling, deformity or effusion. Decreased range of motion. Normal pulse.     Right hand: No tenderness. Normal range of motion.     Cervical back: Normal range of motion and neck supple. No tenderness or bony tenderness.     Thoracic back: No tenderness or bony tenderness.     Lumbar back: No tenderness or bony tenderness.     Left hip: Normal. Normal range of motion.     Left knee: No swelling or bony tenderness. Normal range of motion. Tenderness present.     Left ankle: Normal.     Comments: Patient is able to stand up from the side of the bed and bear  weight on her left leg without any discomfort or guarding.  Skin:    General: Skin is warm and dry.  Neurological:     Mental Status: She is alert and oriented to person,  place, and time.     GCS: GCS eye subscore is 4. GCS verbal subscore is 5. GCS motor subscore is 6.     Cranial Nerves: No cranial nerve deficit.     Sensory: No sensory deficit.     Coordination: Coordination normal.     Deep Tendon Reflexes: Reflexes are normal and symmetric.     ED Results / Procedures / Treatments   Labs (all labs ordered are listed, but only abnormal results are displayed) Labs Reviewed - No data to display  EKG None  Radiology DG Shoulder Right  Result Date: 11/04/2019 CLINICAL DATA:  Pain following fall EXAM: RIGHT SHOULDER - 2+ VIEW COMPARISON:  None. FINDINGS: Oblique, Y scapular, and axillary images were obtained. No fracture or dislocation. Joint spaces appear normal. No erosive change. There is a small calcification lateral to the acromion. Visualized right lung clear. IMPRESSION: No fracture or dislocation. No appreciable joint space narrowing. A small calcification slightly lateral to the acromion may represent a focus of calcific tendinosis. Electronically Signed   By: Lowella Grip III M.D.   On: 11/04/2019 15:00   DG Forearm Right  Result Date: 11/04/2019 CLINICAL DATA:  Pain following fall EXAM: RIGHT FOREARM - 2 VIEW COMPARISON:  None. FINDINGS: Frontal and lateral views were obtained. No fracture or dislocation. Joint spaces appear normal. There is a minus ulnar variance. IMPRESSION: No fracture or dislocation. No evident arthropathy. There is a minus ulnar variance. Electronically Signed   By: Lowella Grip III M.D.   On: 11/04/2019 14:59   DG Wrist Complete Right  Result Date: 11/04/2019 CLINICAL DATA:  Pain following fall EXAM: RIGHT WRIST - COMPLETE 3+ VIEW COMPARISON:  None. FINDINGS: Frontal, oblique, lateral, and ulnar deviation scaphoid images were obtained. No fracture or dislocation. Joint spaces appear normal. No erosive change IMPRESSION: No fracture or dislocation.  No evident arthropathy. Electronically Signed   By: Lowella Grip  III M.D.   On: 11/04/2019 14:59   CT Orbits Wo Contrast  Result Date: 11/04/2019 CLINICAL DATA:  Fall, striking right face. Right periorbital erythema and swelling. EXAM: CT ORBITS WITHOUT CONTRAST TECHNIQUE: Multidetector CT images were obtained using the standard protocol without intravenous contrast. COMPARISON:  None. FINDINGS: Orbits: No visualized orbital or facial fracture. There is right periorbital soft tissue swelling and accentuated density in the subcutaneous tissues probably from ecchymosis, but no postseptal extension or intraorbital abnormality is identified. The globes appear intact. Visualized sinuses: Unremarkable Soft tissues: Right periorbital soft tissue swelling/ecchymosis as noted above. Otherwise unremarkable. Limited intracranial: Unremarkable IMPRESSION: Right periorbital soft tissue swelling/ecchymosis. No visualized intraorbital abnormality or facial fracture. Electronically Signed   By: Van Clines M.D.   On: 11/04/2019 14:42    Procedures Procedures (including critical care time)  Medications Ordered in ED Medications - No data to display  ED Course  I have reviewed the triage vital signs and the nursing notes.  Pertinent labs & imaging results that were available during my care of the patient were reviewed by me and considered in my medical decision making (see chart for details).  Patient seen and examined. Work-up initiated.   Vital signs reviewed and are as follows: BP (!) 156/80 (BP Location: Left Arm)   Pulse 74   Temp 97.9 F (36.6 C) (  Oral)   Resp 16   Ht 5\' 9"  (1.753 m)   Wt 120.2 kg   SpO2 95%   BMI 39.13 kg/m   4:17 PM imaging reviewed.  Patient remained stable during ED stay.  She was provided with a velcro thumb spica splint.  Discussed that given her wrist tenderness, she should follow-up with her doctor in 1 week for reevaluation and possible repeat x-rays if she continues to have pain.  Otherwise we discussed use of NSAIDs, Tylenol,  rice protocol.  Patient verbalizes understanding agrees with plan.    MDM Rules/Calculators/A&P                      Head injury: Negative [place] head CT rules.  No anticoagulation use.  Patient now approximately 15 hours after injury without any decompensation.  Do not feel that she requires complete head CT imaging at this point.  She has a small abrasion and some swelling around her superior orbit.  Evaluated with CT scan of the orbits without bony injury.  Left knee contusion: Minor, patient weightbearing with full range of motion.  No indication for x-rays.  Right wrist pain, forearm pain, shoulder pain: Evaluated with x-rays.  Negative for fracture.  Suspect mainly soft tissue injury.  Patient does have significant anatomic snuffbox tenderness on the right side.  She was given a Velcro wrist splint and encouraged follow-up for repeat imaging if she continues to have pain.  We discussed that this is an area where fractures can occur that do not show up on imaging initially and can cause long-term problems if not addressed appropriately.  Otherwise, right upper extremity is neurovascularly intact.  No indication for emergent orthopedic evaluation.    Final Clinical Impression(s) / ED Diagnoses Final diagnoses:  Fall, initial encounter  Contusion of face, initial encounter  Right wrist pain  Acute pain of right shoulder  Contusion of left knee, initial encounter    Rx / DC Orders ED Discharge Orders    None       Carlisle Cater, Hershal Coria 11/04/19 1620    Truddie Hidden, MD 11/04/19 1950

## 2019-11-04 NOTE — ED Triage Notes (Signed)
Pt states she fell last PM with her knee giving out on her. Pt states she struck her head on concrete. Pt denies LOC or emesis. Pt c/o HA and has hematoma over R eye. Pt also c/o pain to R arm from "hand to shoulder."

## 2019-11-04 NOTE — Discharge Instructions (Signed)
Please read and follow all provided instructions.  Your diagnoses today include:  1. Fall, initial encounter   2. Contusion of face, initial encounter   3. Right wrist pain   4. Acute pain of right shoulder   5. Contusion of left knee, initial encounter     Tests performed today include:  CT scan of your orbits that did not show any serious injury.  X-ray of your wrist, forearm, and shoulder that did not show any broken bones  Vital signs. See below for your results today.   Medications prescribed:   None  Take any prescribed medications only as directed.  Home care instructions:  Follow any educational materials contained in this packet.  BE VERY CAREFUL not to take multiple medicines containing Tylenol (also called acetaminophen). Doing so can lead to an overdose which can damage your liver and cause liver failure and possibly death.   Follow-up instructions: Please follow-up with your primary care provider in the next 7 days for recheck of your wrist.   Return instructions:  SEEK IMMEDIATE MEDICAL ATTENTION IF:  There is confusion or drowsiness (although children frequently become drowsy after injury).   You cannot awaken the injured person.   You have more than one episode of vomiting.   You notice dizziness or unsteadiness which is getting worse, or inability to walk.   You have convulsions or unconsciousness.   You experience severe, persistent headaches not relieved by Tylenol.  You cannot use arms or legs normally.   There are changes in pupil sizes. (This is the black center in the colored part of the eye)   There is clear or bloody discharge from the nose or ears.   You have change in speech, vision, swallowing, or understanding.   Localized weakness, numbness, tingling, or change in bowel or bladder control.  You have any other emergent concerns.  Additional Information: You have had a head injury which does not appear to require admission at  this time.  Your vital signs today were: BP (!) 156/80 (BP Location: Left Arm)    Pulse 74    Temp 97.9 F (36.6 C) (Oral)    Resp 16    Ht 5\' 9"  (1.753 m)    Wt 120.2 kg    SpO2 95%    BMI 39.13 kg/m  If your blood pressure (BP) was elevated above 135/85 this visit, please have this repeated by your doctor within one month. --------------

## 2019-11-20 ENCOUNTER — Telehealth: Payer: Self-pay

## 2019-11-20 NOTE — Telephone Encounter (Signed)
Pt left v/m that she fell in parking lot on 11/03/19 after knee gave way with pt; pt still has continuing pain in rt hand and entire rt arm. Pt said she cannot lift with the rt hand or arm. Pt hit head but did  Not lose consciousness per pt and ED note on 11/04/19.pt is still in a lot of pain and taking tylenol and using heat which is not helping. Per [place] note on 11/04/19 if pt continues to have pain in rt extremity needs to re eval and possible more xrays. Left v/m for pt to cb. Sending note to [place] at front desk and Butch Penny CMA.

## 2019-11-20 NOTE — Telephone Encounter (Signed)
Patient returned call and scheduled appointment on 11/21/19.

## 2019-11-21 ENCOUNTER — Ambulatory Visit: Payer: Self-pay | Admitting: Family Medicine

## 2019-11-21 ENCOUNTER — Other Ambulatory Visit: Payer: Self-pay

## 2019-11-21 ENCOUNTER — Encounter: Payer: Self-pay | Admitting: Family Medicine

## 2019-11-21 VITALS — BP 140/80 | HR 51 | Temp 97.8°F | Ht 69.0 in | Wt 281.5 lb

## 2019-11-21 DIAGNOSIS — M25521 Pain in right elbow: Secondary | ICD-10-CM

## 2019-11-21 DIAGNOSIS — M25531 Pain in right wrist: Secondary | ICD-10-CM

## 2019-11-21 DIAGNOSIS — M25511 Pain in right shoulder: Secondary | ICD-10-CM

## 2019-11-21 DIAGNOSIS — W19XXXD Unspecified fall, subsequent encounter: Secondary | ICD-10-CM

## 2019-11-21 MED ORDER — PREDNISONE 20 MG PO TABS: ORAL_TABLET | ORAL | 0 refills | Status: DC

## 2019-11-21 NOTE — Patient Instructions (Signed)
Continue heat.  Start home PT.  Complete a prednisone taper.  Call if pain not improving after 2 weeks for referral to Sports med or ortho for possible steroid  Injection.

## 2019-11-21 NOTE — Progress Notes (Signed)
Chief Complaint  Patient presents with  . Fall    Seen in ED 11/04/2019  . Arm Pain    Right  . Hand Pain    Right    History of Present Illness: HPI   59 year old female present for follow up ED visit on 11/04/2019 following a fall. He left knee buckled and she fell forward and hit her face, left knee and outstretched right arm.   No LOC.  No CP, no SOB, no palpitations proceeding fall. Neg head CT. X-ray right wrist and forearm and shoulder: no fracture Given velcro wrist splint given pain in right snuff box.   Today she reports continued pain in  Right arm. Has ain in right shoulder and elbowt radiating downwards.  She is sore  In wrist but this is the least sore area.  Pain with reaching arm forward.  Decreased ROM in shoulder.. pain with abduction beyond 45 degrees.  no weakness. No neck pain. Hand feels stiff  She has less bruising on face, still mildly sore above right eye.   Using tylenol and ibuprofen off and on unhelpful.  Using heat.. that helps some temporarily.  This visit occurred during the SARS-CoV-2 public health emergency.  Safety protocols were in place, including screening questions prior to the visit, additional usage of staff PPE, and extensive cleaning of exam room while observing appropriate contact time as indicated for disinfecting solutions.   COVID 19 screen:  No recent travel or known exposure to COVID19 The patient denies respiratory symptoms of COVID 19 at this time. The importance of social distancing was discussed today.     Review of Systems  Constitutional: Negative for chills and fever.  HENT: Negative for congestion and ear pain.   Eyes: Negative for pain and redness.  Respiratory: Negative for cough and shortness of breath.   Cardiovascular: Negative for chest pain, palpitations and leg swelling.  Gastrointestinal: Negative for abdominal pain, blood in stool, constipation, diarrhea, nausea and vomiting.  Genitourinary: Negative for  dysuria.  Musculoskeletal: Positive for falls. Negative for myalgias.  Skin: Negative for rash.  Neurological: Positive for focal weakness. Negative for dizziness.  Psychiatric/Behavioral: Negative for depression. The patient is not nervous/anxious.       Past Medical History:  Diagnosis Date  . Allergy    SEASONAL  . Anemia   . CAD S/P percutaneous coronary angioplasty 09/2013   a) Ostial AV G Cx - 2.5 mm Angiosculpt; mid LAD 40-60%; b) Myoview 07/2014: LOW RISK, small-severe fixed inferior defect c/w infarct w/o peri-infarct ischemia.  . Chronic bronchitis ([place])    "frequently; not q yr" (10/03/2013)  . Diverticulosis   . Essential hypertension    with prior Accelerated HTN  . GERD (gastroesophageal reflux disease)   . Hiatal hernia   . Hx of non-ST elevation myocardial infarction (NSTEMI) 10/01/2013   Due to Accelerated HTN with existing CAD  . Hyperlipemia   . Migraine    "@ least once/month" (10/03/2013)  . OSA (obstructive sleep apnea) 06/10/2016  . Schatzki's ring   . Seasonal allergies   . Sinusitis     reports that she has never smoked. She has never used smokeless tobacco. She reports current alcohol use. She reports that she does not use drugs.   Current Outpatient Medications:  .  albuterol (PROVENTIL HFA;VENTOLIN HFA) 108 (90 Base) MCG/ACT inhaler, INHALE 2 PUFFS BY MOUTH EVERY 6 HOURS AS NEEDED FOR WHEEZING OR  SHORTNESS  OF  BREATH, Disp: 18 each, Rfl:  0 .  amLODipine (NORVASC) 5 MG tablet, Take 1 tablet (5 mg total) by mouth daily., Disp: 90 tablet, Rfl: 3 .  aspirin EC 81 MG EC tablet, Take 1 tablet (81 mg total) by mouth daily., Disp: , Rfl:  .  atorvastatin (LIPITOR) 80 MG tablet, Take 1 tablet (80 mg total) by mouth daily., Disp: 90 tablet, Rfl: 3 .  carvedilol (COREG) 25 MG tablet, Take 1 tablet (25 mg total) by mouth 2 (two) times daily with a meal., Disp: 180 tablet, Rfl: 3 .  ezetimibe (ZETIA) 10 MG tablet, Take 1 tablet (10 mg total) by mouth daily., Disp: 30  tablet, Rfl: 11 .  famotidine (PEPCID) 20 MG tablet, Take 20 mg by mouth 2 (two) times daily., Disp: , Rfl:  .  fluticasone (FLONASE) 50 MCG/ACT nasal spray, Place 2 sprays into both nostrils daily., Disp: 16 g, Rfl: 0 .  guaiFENesin-codeine (ROBITUSSIN AC) 100-10 MG/5ML syrup, Take 5-10 mLs by mouth at bedtime as needed for cough., Disp: 180 mL, Rfl: 0 .  ibuprofen (ADVIL) 800 MG tablet, Take 1 tablet (800 mg total) by mouth every 8 (eight) hours as needed., Disp: 30 tablet, Rfl: 0 .  isosorbide mononitrate (IMDUR) 60 MG 24 hr tablet, Take 2 tablets (120 mg total) by mouth daily., Disp: 180 tablet, Rfl: 3 .  losartan (COZAAR) 100 MG tablet, Take 1 tablet (100 mg total) by mouth daily., Disp: 90 tablet, Rfl: 3 .  montelukast (SINGULAIR) 10 MG tablet, TAKE 1 TABLET BY MOUTH AT BEDTIME, Disp: 90 tablet, Rfl: 3 .  Multiple Vitamin (MULTIVITAMIN WITH MINERALS) TABS tablet, Take 1 tablet by mouth daily., Disp: , Rfl:  .  Multiple Vitamins-Minerals (EMERGEN-C IMMUNE) PACK, Take 1 packet by mouth daily., Disp: , Rfl:  .  nitroGLYCERIN (NITROSTAT) 0.4 MG SL tablet, Place 1 tablet (0.4 mg total) under the tongue every 5 (five) minutes as needed for chest pain. May take up to 3 tablets. If still having chest pain, call 911 immediately., Disp: 20 tablet, Rfl: 0 .  pantoprazole (PROTONIX) 40 MG tablet, Take 1 tablet by mouth once daily, Disp: 90 tablet, Rfl: 3   Observations/Objective: Blood pressure 140/80, pulse (!) 51, temperature 97.8 F (36.6 C), temperature source Temporal, height 5\' 9"  (1.753 m), weight 281 lb 8 oz (127.7 kg), SpO2 99 %.  Physical Exam Constitutional:      General: She is not in acute distress.    Appearance: Normal appearance. She is well-developed. She is not ill-appearing or toxic-appearing.  HENT:     Head: Normocephalic.     Right Ear: Hearing, tympanic membrane, ear canal and external ear normal. Tympanic membrane is not erythematous, retracted or bulging.     Left Ear:  Hearing, tympanic membrane, ear canal and external ear normal. Tympanic membrane is not erythematous, retracted or bulging.     Nose: No mucosal edema or rhinorrhea.     Right Sinus: No maxillary sinus tenderness or frontal sinus tenderness.     Left Sinus: No maxillary sinus tenderness or frontal sinus tenderness.     Mouth/Throat:     Pharynx: Uvula midline.  Eyes:     General: Lids are normal. Lids are everted, no foreign bodies appreciated.     Conjunctiva/sclera: Conjunctivae normal.     Pupils: Pupils are equal, round, and reactive to light.  Neck:     Thyroid: No thyroid mass or thyromegaly.     Vascular: No carotid bruit.     Trachea: Trachea normal.  Cardiovascular:     Rate and Rhythm: Normal rate and regular rhythm.     Pulses: Normal pulses.     Heart sounds: Normal heart sounds, S1 normal and S2 normal. No murmur. No friction rub. No gallop.   Pulmonary:     Effort: Pulmonary effort is normal. No tachypnea or respiratory distress.     Breath sounds: Normal breath sounds. No decreased breath sounds, wheezing, rhonchi or rales.  Abdominal:     General: Bowel sounds are normal.     Palpations: Abdomen is soft.     Tenderness: There is no abdominal tenderness.  Musculoskeletal:     Right shoulder: Tenderness and bony tenderness present. Decreased range of motion. Decreased strength. Normal pulse.     Left shoulder: Normal.     Right upper arm: Normal.     Right elbow: Normal range of motion. Tenderness present in medial epicondyle.     Left elbow: Normal range of motion.     Right wrist: Tenderness present. No swelling, bony tenderness, snuff box tenderness or crepitus. Normal range of motion.     Cervical back: Normal, normal range of motion and neck supple. No tenderness or bony tenderness. No pain with movement. Normal range of motion.     Thoracic back: Normal.  Skin:    General: Skin is warm and dry.     Findings: No rash.  Neurological:     Mental Status: She is  alert.  Psychiatric:        Mood and Affect: Mood is not anxious or depressed.        Speech: Speech normal.        Behavior: Behavior normal. Behavior is cooperative.        Thought Content: Thought content normal.        Judgment: Judgment normal.      Assessment and Plan    Accidental fall resulting in right shoulder, elbow and wrist injury. Neg X-rays.  No clear indication for repeat of X-ray.. no snuff box ttp.   Most likely ligamentous, muscle,soft tissue injury.  Shoulder pain most significant and suggestive of rotator cuff strain, no evidence of full tear ( neg Drop arm). Treat with prednisone , heat and home pt. If not improving refer to SM or orho.  Eliezer Lofts, MD

## 2019-11-28 ENCOUNTER — Telehealth: Payer: Self-pay

## 2019-11-28 NOTE — Telephone Encounter (Signed)
Please have her make appt with Dr. Lorelei Pont or let me know if she prefers ortho referral ( location)

## 2019-11-28 NOTE — Telephone Encounter (Signed)
Pt seen 11/21/19 and pt thought was to cb after 5 days of prednisone with update; pt said pain level now is 7 and last wk was 10; pt still has significant pain shooting down arm, still cannot lift with wrist or hand. Pt want to know next step; per AVS on 11/21/19 after 2 wks call if not improved for possible sport med or ortho referral.Please advise.

## 2019-11-29 NOTE — Telephone Encounter (Signed)
[place] notified as instructed by telephone.  Appointment scheduled with Dr. Lorelei Pont for 11/30/2019 at 11:40 am.

## 2019-11-30 ENCOUNTER — Ambulatory Visit (INDEPENDENT_AMBULATORY_CARE_PROVIDER_SITE_OTHER)
Admission: RE | Admit: 2019-11-30 | Discharge: 2019-11-30 | Disposition: A | Discharge status: Home / Self Care | Payer: Self-pay | Source: Ambulatory Visit | Attending: Family Medicine | Admitting: Family Medicine

## 2019-11-30 ENCOUNTER — Encounter: Payer: Self-pay | Admitting: Family Medicine

## 2019-11-30 ENCOUNTER — Other Ambulatory Visit: Payer: Self-pay

## 2019-11-30 ENCOUNTER — Ambulatory Visit: Payer: Self-pay | Admitting: Family Medicine

## 2019-11-30 VITALS — BP 120/82 | HR 67 | Temp 97.4°F | Ht 69.0 in | Wt 281.5 lb

## 2019-11-30 DIAGNOSIS — M25531 Pain in right wrist: Secondary | ICD-10-CM

## 2019-11-30 DIAGNOSIS — R29898 Other symptoms and signs involving the musculoskeletal system: Secondary | ICD-10-CM

## 2019-11-30 DIAGNOSIS — G8911 Acute pain due to trauma: Secondary | ICD-10-CM

## 2019-11-30 DIAGNOSIS — M25511 Pain in right shoulder: Secondary | ICD-10-CM

## 2019-11-30 MED ORDER — DIAZEPAM 2 MG PO TABS: ORAL_TABLET | ORAL | 0 refills | Status: DC

## 2019-11-30 NOTE — Progress Notes (Signed)
Christine Slater T. Dianah Pruett, MD, [place] at [facility] [name], [address]  Phone: [phone]   FAX: Lena - 59 y.o. female   MRN [id number]   Date of Birth: 1960-12-20  Date: 11/30/2019   PCP: Jinny Sanders, MD   Referral: Jinny Sanders, MD  Chief Complaint  Patient presents with   Shoulder Pain    Right-Seen by Dr. [name] 11/21/2019   Arm Pain    Right    This visit occurred during the SARS-CoV-2 public health emergency.  Safety protocols were in place, including screening questions prior to the visit, additional usage of staff PPE, and extensive cleaning of exam room while observing appropriate contact time as indicated for disinfecting solutions.   Subjective:   Christine Barrett is a 59 y.o. very pleasant female patient with Body mass index is 41.57 kg/m. who presents with the following:  This is a consultation requested by my partner Dr. [name].  She presents after a trauma that occurred on Nov 03, 2019.  At that time her left knee gave way and she fell forward and she struck her left knee and onto her right arm.  She had multiple studies including CT of the orbits, wrist, forearm, as well as shoulder all on the right, and these were grossly unremarkable.  The radiological images were independently reviewed by myself in the office and results were reviewed with the patient. My independent interpretation of images:    Xrays, Wrist, 3 Views: AP, Lateral, and Oblique Hand x-rayed: Right Indication: wrist pain Findings: No evidence of acute fracture or dislocation.   Xrays: Shoulder series, right, scapular Y, oblique, as well as axillary. Indication: shoulder pain Findings: No evidence of occult fracture No significant glenohumeral arthritis AC joint: no arthropathy, space preserved Impingement pathology: none significant, Type I Acromium   Right forearm films  are also unremarkable with no evidence of fracture or dislocation. Electronically Signed  By: Owens Loffler, MD On: 11/30/2019 11:40 AM EDT   Primary issue now is her continued right-sided shoulder pain.  She has pain going down her arm, pain with moving it, and she is functionally not able to lift her arm actively greater than about 35 degrees.  She has pain at night and pain with sleeping on her left side.  She also still does have some pain in the wrist on the radial side and the true wrist.  She does not have any swelling or bruising in this region.  R shoulder pain, pain going down her arm and moving it.  About the same and cannot abd arm.  Occ stiffness in her arm.   Sleeping on her left side. Scaphoid is still tender.  No insurance now.   Review of Systems is noted in the HPI, as appropriate   Objective:   BP 120/82    Pulse 67    Temp (!) 97.4 F (36.3 C) (Temporal)    Ht 5\' 9"  (1.753 m)    Wt 281 lb 8 oz (127.7 kg)    SpO2 100%    BMI 41.57 kg/m    GEN: No acute distress; alert,appropriate. PULM: Breathing comfortably in no respiratory distress PSYCH: Normally interactive.    Right shoulder: She does have pain in a T-shirt distribution in the right shoulder.  On active range of motion in abduction as well as flexion the patient is able to actively move the  arm to approximately 40 degrees, and somewhat higher than this with quite a bit of scapular involvement.  Passively, and able to abduct shoulder to approximately 155 degrees.  She does have a positive drop test.  She has tenderness in the bicipital groove.  Speeds test as well as Yergason's tests are positive.  Right shoulder 2+/5 in abduction Flexion 3/5 External rotation is 4 -/5 Internal range of motion is 4+/5  She is otherwise neurovascularly intact.  At the wrist the patient does have some tenderness at the base of the first digit in and around the region of the scaphoid and with manipulation in this  region.  Radiology: DG Shoulder Right  Result Date: 11/04/2019 CLINICAL DATA:  Pain following fall EXAM: RIGHT SHOULDER - 2+ VIEW COMPARISON:  None. FINDINGS: Oblique, Y scapular, and axillary images were obtained. No fracture or dislocation. Joint spaces appear normal. No erosive change. There is a small calcification lateral to the acromion. Visualized right lung clear. IMPRESSION: No fracture or dislocation. No appreciable joint space narrowing. A small calcification slightly lateral to the acromion may represent a focus of calcific tendinosis. Electronically Signed   By: Lowella Grip III M.D.   On: 11/04/2019 15:00   DG Forearm Right  Result Date: 11/04/2019 CLINICAL DATA:  Pain following fall EXAM: RIGHT FOREARM - 2 VIEW COMPARISON:  None. FINDINGS: Frontal and lateral views were obtained. No fracture or dislocation. Joint spaces appear normal. There is a minus ulnar variance. IMPRESSION: No fracture or dislocation. No evident arthropathy. There is a minus ulnar variance. Electronically Signed   By: Lowella Grip III M.D.   On: 11/04/2019 14:59   DG Wrist Complete Right  Result Date: 11/04/2019 CLINICAL DATA:  Pain following fall EXAM: RIGHT WRIST - COMPLETE 3+ VIEW COMPARISON:  None. FINDINGS: Frontal, oblique, lateral, and ulnar deviation scaphoid images were obtained. No fracture or dislocation. Joint spaces appear normal. No erosive change IMPRESSION: No fracture or dislocation.  No evident arthropathy. Electronically Signed   By: Lowella Grip III M.D.   On: 11/04/2019 14:59   CT Orbits Wo Contrast  Result Date: 11/04/2019 CLINICAL DATA:  Fall, striking right face. Right periorbital erythema and swelling. EXAM: CT ORBITS WITHOUT CONTRAST TECHNIQUE: Multidetector CT images were obtained using the standard protocol without intravenous contrast. COMPARISON:  None. FINDINGS: Orbits: No visualized orbital or facial fracture. There is right periorbital soft tissue swelling and  accentuated density in the subcutaneous tissues probably from ecchymosis, but no postseptal extension or intraorbital abnormality is identified. The globes appear intact. Visualized sinuses: Unremarkable Soft tissues: Right periorbital soft tissue swelling/ecchymosis as noted above. Otherwise unremarkable. Limited intracranial: Unremarkable IMPRESSION: Right periorbital soft tissue swelling/ecchymosis. No visualized intraorbital abnormality or facial fracture. Electronically Signed   By: Van Clines M.D.   On: 11/04/2019 14:42   DG Wrist Complete Right  Result Date: 11/30/2019 CLINICAL DATA:  59 year old female with fall and right wrist pain. EXAM: RIGHT WRIST - COMPLETE 3+ VIEW COMPARISON:  Right wrist radiograph dated 11/04/2019. FINDINGS: There is no acute fracture or dislocation. The bones are well mineralized. No significant arthritic changes. The soft tissues are unremarkable. IMPRESSION: Negative. Electronically Signed   By: Anner Crete M.D.   On: 11/30/2019 21:26    Assessment and Plan:     ICD-10-CM   1. Acute pain of right shoulder due to trauma  M25.511 MR Shoulder Right Wo Contrast   G89.11   2. Acute pain of right shoulder  M25.511 MR Shoulder Right Wo  Contrast  3. Weakness of shoulder  R29.898 MR Shoulder Right Wo Contrast  4. Acute wrist pain, right  M25.531 DG Wrist Complete Right   I suspect that the patient has a full-thickness versus high-grade partial-thickness rotator cuff tear in the supraspinatus, possibly in the infraspinatus.  Suspect large-scale injury.  Obtain an MRI of the right shoulder to evaluate for multiple injuries at the rotator cuff, and this would be a preprocedural evaluation.  Consultation with orthopedic shoulder surgeon most appropriate in this case.  If there is not a high-grade lesion, then I can manage this conservatively.  Her repeat wrist x-rays by myself are unremarkable including no evidence of a scaphoid fracture.  I have also given her  paperwork for hospital financial assistance.  Follow-up: No follow-ups on file.  Meds ordered this encounter  Medications   diazepam (VALIUM) 2 MG tablet    Sig: 1 tab 45 minutes before MRI, may repeat if needed    Dispense:  2 tablet    Refill:  0   Medications Discontinued During This Encounter  Medication Reason   guaiFENesin-codeine (ROBITUSSIN AC) 100-10 MG/5ML syrup Completed Course   nitroGLYCERIN (NITROSTAT) 0.4 MG SL tablet Completed Course   Orders Placed This Encounter  Procedures   DG Wrist Complete Right   MR Shoulder Right Wo Contrast    Signed,  Owyn Raulston T. Neleh Muldoon, MD   Outpatient Encounter Medications as of 11/30/2019  Medication Sig   albuterol (PROVENTIL HFA;VENTOLIN HFA) 108 (90 Base) MCG/ACT inhaler INHALE 2 PUFFS BY MOUTH EVERY 6 HOURS AS NEEDED FOR WHEEZING OR  SHORTNESS  OF  BREATH   amLODipine (NORVASC) 5 MG tablet Take 1 tablet (5 mg total) by mouth daily.   aspirin EC 81 MG EC tablet Take 1 tablet (81 mg total) by mouth daily.   atorvastatin (LIPITOR) 80 MG tablet Take 1 tablet (80 mg total) by mouth daily.   carvedilol (COREG) 25 MG tablet Take 1 tablet (25 mg total) by mouth 2 (two) times daily with a meal.   ezetimibe (ZETIA) 10 MG tablet Take 1 tablet (10 mg total) by mouth daily.   famotidine (PEPCID) 20 MG tablet Take 20 mg by mouth 2 (two) times daily.   fluticasone (FLONASE) 50 MCG/ACT nasal spray Place 2 sprays into both nostrils daily.   ibuprofen (ADVIL) 800 MG tablet Take 1 tablet (800 mg total) by mouth every 8 (eight) hours as needed.   isosorbide mononitrate (IMDUR) 60 MG 24 hr tablet Take 2 tablets (120 mg total) by mouth daily.   losartan (COZAAR) 100 MG tablet Take 1 tablet (100 mg total) by mouth daily.   montelukast (SINGULAIR) 10 MG tablet TAKE 1 TABLET BY MOUTH AT BEDTIME   Multiple Vitamin (MULTIVITAMIN WITH MINERALS) TABS tablet Take 1 tablet by mouth daily.   Multiple Vitamins-Minerals (EMERGEN-C IMMUNE)  PACK Take 1 packet by mouth daily.   pantoprazole (PROTONIX) 40 MG tablet Take 1 tablet by mouth once daily   predniSONE (DELTASONE) 20 MG tablet 2 tabs for 5 day than 1 tablet for 5 days.   diazepam (VALIUM) 2 MG tablet 1 tab 45 minutes before MRI, may repeat if needed   [DISCONTINUED] guaiFENesin-codeine (ROBITUSSIN AC) 100-10 MG/5ML syrup Take 5-10 mLs by mouth at bedtime as needed for cough.   [DISCONTINUED] nitroGLYCERIN (NITROSTAT) 0.4 MG SL tablet Place 1 tablet (0.4 mg total) under the tongue every 5 (five) minutes as needed for chest pain. May take up to 3 tablets. If still having chest  pain, call 911 immediately.   No facility-administered encounter medications on file as of 11/30/2019.

## 2019-12-01 ENCOUNTER — Encounter: Payer: Self-pay | Admitting: Family Medicine

## 2019-12-09 ENCOUNTER — Other Ambulatory Visit: Payer: Self-pay

## 2019-12-09 ENCOUNTER — Ambulatory Visit
Admission: RE | Admit: 2019-12-09 | Discharge: 2019-12-09 | Disposition: A | Discharge status: Home / Self Care | Payer: Self-pay | Source: Ambulatory Visit | Attending: Family Medicine | Admitting: Family Medicine

## 2019-12-09 DIAGNOSIS — M25511 Pain in right shoulder: Secondary | ICD-10-CM

## 2019-12-09 DIAGNOSIS — R29898 Other symptoms and signs involving the musculoskeletal system: Secondary | ICD-10-CM

## 2019-12-11 ENCOUNTER — Other Ambulatory Visit: Payer: Self-pay | Admitting: Family Medicine

## 2019-12-11 ENCOUNTER — Encounter: Payer: Self-pay | Admitting: Family Medicine

## 2019-12-11 MED ORDER — TRAMADOL HCL 50 MG PO TABS: 50.0000 mg | ORAL_TABLET | Freq: Three times a day (TID) | ORAL | 0 refills | Status: DC | PRN

## 2019-12-11 NOTE — Progress Notes (Signed)
I cannot tell which pharmacy she uses. More than 5 are listed.

## 2019-12-13 ENCOUNTER — Ambulatory Visit (INDEPENDENT_AMBULATORY_CARE_PROVIDER_SITE_OTHER): Payer: Self-pay | Admitting: Family Medicine

## 2019-12-13 ENCOUNTER — Encounter: Payer: Self-pay | Admitting: Family Medicine

## 2019-12-13 ENCOUNTER — Other Ambulatory Visit: Payer: Self-pay

## 2019-12-13 VITALS — BP 150/80 | HR 90 | Temp 97.2°F | Ht 69.0 in | Wt 285.8 lb

## 2019-12-13 DIAGNOSIS — M25511 Pain in right shoulder: Secondary | ICD-10-CM

## 2019-12-13 DIAGNOSIS — S43431A Superior glenoid labrum lesion of right shoulder, initial encounter: Secondary | ICD-10-CM

## 2019-12-13 DIAGNOSIS — S46011A Strain of muscle(s) and tendon(s) of the rotator cuff of right shoulder, initial encounter: Secondary | ICD-10-CM

## 2019-12-13 MED ORDER — METHYLPREDNISOLONE ACETATE 40 MG/ML IJ SUSP
80.0000 mg | Freq: Once | INTRAMUSCULAR | Status: AC
  Administered 2019-12-13: 80 mg via INTRA_ARTICULAR

## 2019-12-13 MED ORDER — TRAMADOL HCL 50 MG PO TABS: 50.0000 mg | ORAL_TABLET | Freq: Three times a day (TID) | ORAL | 0 refills | Status: AC | PRN

## 2019-12-13 NOTE — Progress Notes (Signed)
Christine Mangel T. Kabao Leite, MD, [place] at [facility] Christine Barrett, [id number]  Phone: [phone]  FAX: [place] - 59 y.o. female  MRN [id number]  Date of Birth: 18-Mar-1961  Date: 12/13/2019  PCP: Christine Sanders, MD  Referral: Christine Sanders, MD  Chief Complaint  Patient presents with  . Follow-up    Right Shoulder MRI    This visit occurred during the SARS-CoV-2 public health emergency.  Safety protocols were in place, including screening questions prior to the visit, additional usage of staff PPE, and extensive cleaning of exam room while observing appropriate contact time as indicated for disinfecting solutions.   Subjective:   Christine Barrett is a 59 y.o. very pleasant female patient with Body mass index is 42.2 kg/m. who presents with the following:  F/u R shoulder trauma: She is here today to follow-up on her right shoulder injury.  To recap, she had a traumatic event on Nov 03, 2019, and that time her left knee gave way and she fell forward and struck her knee, arm, and she ultimately was seen in the ER.  Multiple plain films including CT of the orbits, wrist, forearm, and shoulder all on the right side were negative.  Her issue continues to be her right shoulder.  Last saw her initially she was having great deal of difficulty even moving it actively and was only able to fairly move her shoulder.  She continues to have some significant pain and limitation in motion but quite a lot.  She also has some significant stiffness in her shoulder.  I reviewed her MRI with her face to face in the office.  She does have a partial-thickness rotator cuff tear as well as a labral tear.  There is also some tendinopathy.  Weak and LOM  Review of Systems is noted in the HPI, as appropriate   Objective:   BP (!) 150/80   Pulse 90   Temp (!) 97.2 F (36.2 C) (Temporal)   Ht 5\' 9"   (1.753 m)   Wt 285 lb 12 oz (129.6 kg)   SpO2 98%   BMI 42.20 kg/m   GEN: No acute distress; alert,appropriate. PULM: Breathing comfortably in no respiratory distress PSYCH: Normally interactive.    Nontender throughout all bony anatomy.  She is only able to actively abduct the right shoulder to about 40 degrees.  On passive range of motion I can get this to approximately 110 degrees.  Strength in this plane of motion is 2+ to 3 -  Flexion is also to approximately 110 degrees.  External rotation strength is 4/5 and internal range of motion is 4+/5.  Additional special testing is difficult given the patient's lack of motion and strength.  Drop test is positive  Radiology: DG Wrist Complete Right  Result Date: 11/30/2019 CLINICAL DATA:  59 year old female with fall and right wrist pain. EXAM: RIGHT WRIST - COMPLETE 3+ VIEW COMPARISON:  Right wrist radiograph dated 11/04/2019. FINDINGS: There is no acute fracture or dislocation. The bones are well mineralized. No significant arthritic changes. The soft tissues are unremarkable. IMPRESSION: Negative. Electronically Signed   By: Anner Crete M.D.   On: 11/30/2019 21:26   MR Shoulder Right Wo Contrast  Result Date: 12/09/2019 CLINICAL DATA:  Shoulder pain. Status post fall. EXAM: MRI OF THE RIGHT SHOULDER WITHOUT CONTRAST TECHNIQUE: Multiplanar, multisequence MR imaging of the shoulder was performed. No intravenous  contrast was administered. COMPARISON:  None. FINDINGS: Rotator cuff: Moderate tendinosis of the supraspinatus tendon with a partial-thickness articular surface tear anteriorly. Mild tendinosis of the infraspinatus tendon with fraying along the bursal surface. Teres minor tendon is intact. Subscapularis tendon is intact. Muscles: No atrophy or fatty replacement of nor abnormal signal within, the muscles of the rotator cuff. Biceps long head:  Intact. Acromioclavicular Joint: Mild arthropathy of the acromioclavicular joint. Type  II acromion. Small amount of subacromial/subdeltoid bursal fluid. Glenohumeral Joint: No joint effusion. No chondral defect. Labrum: Limited evaluation of the labrum secondary lack of intra-articular fluid. Superior labral tear extending into the posterior Bones:  No acute osseous abnormality. No aggressive osseous lesion. Other: No fluid collection or hematoma. IMPRESSION: 1. Moderate tendinosis of the supraspinatus tendon with a partial-thickness articular surface tear anteriorly. 2. Mild tendinosis of the infraspinatus tendon with fraying along the bursal surface. 3. Mild subacromial/subdeltoid bursitis. Electronically Signed   By: Kathreen Devoid   On: 12/09/2019 13:18    Assessment and Plan:     ICD-10-CM   1. Traumatic tear of right rotator cuff, unspecified tear extent, initial encounter  S46.011A Ambulatory referral to Orthopedic Surgery  2. Glenoid labral tear, right, initial encounter  S43.431A Ambulatory referral to Orthopedic Surgery  3. Acute pain of right shoulder  M25.511 Ambulatory referral to Orthopedic Surgery    methylPREDNISolone acetate (DEPO-MEDROL) injection 80 mg   She does have a partial-thickness rotator cuff tear as well as a labral tear.  Thus far she has not improved at all compared to her initial evaluation after her initial fall.  I did a subacromial injection today in the office, and she felt better almost immediately after this.  Even after injection anesthesia, the patient's range of motion was limited.  I worried that she may have started to develop a secondary adhesive capsulitis as well secondary to pain.  I think that the patient would be well served by speaking about her shoulder with one of the shoulder surgeons.  I appreciate their help in this matter.  Aspiration/Injection Procedure Note Christine Barrett 12/17/1960 Date of procedure: 12/13/2019  Procedure: Large Joint Aspiration / Injection of Shoulder, Subacromial, R Indications: Pain  Procedure  Details Verbal consent was obtained from the patient. Risks (including rare infection), benefits, and alternatives were explained. Patient prepped with Chloraprep and Ethyl Chloride used for anesthesia. The subacromial space was injected using the posterior approach. The patient tolerated the procedure well and had decreased pain post injection. No complications. Injection: 8 cc of Lidocaine 1% and 2 mL of Depo-Medrol 40 mg. Needle: 22 gauge Medication: 2 mL of Depo-Medrol 40 mg, equaling Depo-Medrol 80 mg total  Follow-up: No follow-ups on file.  Meds ordered this encounter  Medications  . traMADol (ULTRAM) 50 MG tablet    Sig: Take 1 tablet (50 mg total) by mouth every 8 (eight) hours as needed for up to 5 days for moderate pain.    Dispense:  20 tablet    Refill:  0  . methylPREDNISolone acetate (DEPO-MEDROL) injection 80 mg   Medications Discontinued During This Encounter  Medication Reason  . predniSONE (DELTASONE) 20 MG tablet   . traMADol (ULTRAM) 50 MG tablet Reorder   Orders Placed This Encounter  Procedures  . Ambulatory referral to Orthopedic Surgery    Signed,  Frederico Hamman T. Arnett Duddy, MD   Outpatient Encounter Medications as of 12/13/2019  Medication Sig  . albuterol (PROVENTIL HFA;VENTOLIN HFA) 108 (90 Base) MCG/ACT inhaler INHALE 2 PUFFS BY  MOUTH EVERY 6 HOURS AS NEEDED FOR WHEEZING OR  SHORTNESS  OF  BREATH  . amLODipine (NORVASC) 5 MG tablet Take 1 tablet (5 mg total) by mouth daily.  [name] aspirin EC 81 MG EC tablet Take 1 tablet (81 mg total) by mouth daily.  [name] atorvastatin (LIPITOR) 80 MG tablet Take 1 tablet (80 mg total) by mouth daily.  . carvedilol (COREG) 25 MG tablet Take 1 tablet (25 mg total) by mouth 2 (two) times daily with a meal.  . diazepam (VALIUM) 2 MG tablet 1 tab 45 minutes before MRI, may repeat if needed  . ezetimibe (ZETIA) 10 MG tablet Take 1 tablet (10 mg total) by mouth daily.  . famotidine (PEPCID) 20 MG tablet Take 20 mg by mouth 2 (two)  times daily.  . fluticasone (FLONASE) 50 MCG/ACT nasal spray Place 2 sprays into both nostrils daily.  [name] ibuprofen (ADVIL) 800 MG tablet Take 1 tablet (800 mg total) by mouth every 8 (eight) hours as needed.  . isosorbide mononitrate (IMDUR) 60 MG 24 hr tablet Take 2 tablets (120 mg total) by mouth daily.  [name] losartan (COZAAR) 100 MG tablet Take 1 tablet (100 mg total) by mouth daily.  . montelukast (SINGULAIR) 10 MG tablet TAKE 1 TABLET BY MOUTH AT BEDTIME  . Multiple Vitamin (MULTIVITAMIN WITH MINERALS) TABS tablet Take 1 tablet by mouth daily.  . Multiple Vitamins-Minerals (EMERGEN-C IMMUNE) PACK Take 1 packet by mouth daily.  . pantoprazole (PROTONIX) 40 MG tablet Take 1 tablet by mouth once daily  . traMADol (ULTRAM) 50 MG tablet Take 1 tablet (50 mg total) by mouth every 8 (eight) hours as needed for up to 5 days for moderate pain.  . [DISCONTINUED] predniSONE (DELTASONE) 20 MG tablet 2 tabs for 5 day than 1 tablet for 5 days.  . [DISCONTINUED] traMADol (ULTRAM) 50 MG tablet Take 1 tablet (50 mg total) by mouth every 8 (eight) hours as needed for up to 5 days for moderate pain.  . [EXPIRED] methylPREDNISolone acetate (DEPO-MEDROL) injection 80 mg    No facility-administered encounter medications on file as of 12/13/2019.

## 2019-12-22 ENCOUNTER — Encounter: Payer: Self-pay | Admitting: Orthopedic Surgery

## 2019-12-22 ENCOUNTER — Ambulatory Visit (INDEPENDENT_AMBULATORY_CARE_PROVIDER_SITE_OTHER): Payer: Self-pay | Admitting: Orthopedic Surgery

## 2019-12-22 ENCOUNTER — Ambulatory Visit: Payer: Self-pay

## 2019-12-22 DIAGNOSIS — M7501 Adhesive capsulitis of right shoulder: Secondary | ICD-10-CM

## 2019-12-22 DIAGNOSIS — M75101 Unspecified rotator cuff tear or rupture of right shoulder, not specified as traumatic: Secondary | ICD-10-CM

## 2019-12-22 DIAGNOSIS — M25511 Pain in right shoulder: Secondary | ICD-10-CM

## 2019-12-22 MED ORDER — BUPIVACAINE HCL 0.5 % IJ SOLN
3.0000 mL | INTRAMUSCULAR | Status: AC | PRN
  Administered 2019-12-22: 3 mL via INTRA_ARTICULAR

## 2019-12-22 MED ORDER — TRIAMCINOLONE ACETONIDE 40 MG/ML IJ SUSP
60.0000 mg | INTRAMUSCULAR | Status: AC | PRN
  Administered 2019-12-22: 60 mg via INTRA_ARTICULAR

## 2019-12-22 NOTE — Progress Notes (Signed)
   Christine Barrett - 59 y.o. female MRN [id number]  Date of birth: 18-May-1961  Office Visit Note: Visit Date: 12/22/2019 PCP: Jinny Sanders, MD Referred by: Jinny Sanders, MD  Subjective: Chief Complaint  Patient presents with  . Right Shoulder - Pain   HPI:  Christine Barrett is a 59 y.o. female who comes in today at the request of Dr. Anderson Malta for planned Right glenohumeral joint injection with fluoroscopic guidance.  The patient has failed conservative care including home exercise, medications, time and activity modification.  This injection will be diagnostic and hopefully therapeutic.  Please see requesting physician notes for further details and justification.   ROS Otherwise per HPI.  Assessment & Plan: Visit Diagnoses:  1. Right shoulder pain, unspecified chronicity     Plan: No additional findings.   Meds & Orders: No orders of the defined types were placed in this encounter.   Orders Placed This Encounter  Procedures  . Large Joint Inj  . XR C-ARM NO REPORT    Follow-up: No follow-ups on file.   Procedures: Large Joint Inj: R glenohumeral on 12/22/2019 11:25 AM Indications: pain and diagnostic evaluation Details: 22 G 3.5 in needle, fluoroscopy-guided anteromedial approach  Arthrogram: No  Medications: 3 mL bupivacaine 0.5 %; 60 mg triamcinolone acetonide 40 MG/ML Outcome: tolerated well, no immediate complications  There was excellent flow of contrast producing a partial arthrogram of the glenohumeral joint. The patient did have good relief of symptoms during the anesthetic phase of the injection. Procedure, treatment alternatives, risks and benefits explained, specific risks discussed. Consent was given by the patient. Immediately prior to procedure a time out was called to verify the correct patient, procedure, equipment, support staff and site/side marked as required. Patient was prepped and draped in the usual sterile fashion.      No notes on file    Clinical History: No specialty comments available.     Objective:  VS:  HT:    WT:   BMI:     BP:   HR: bpm  TEMP: ( )  RESP:  Physical Exam   Imaging: No results found.

## 2019-12-22 NOTE — Progress Notes (Signed)
Office Visit Note   Patient: Consepcion Barrett           Date of Birth: 1960-10-26           MRN: [id number] Visit Date: 12/22/2019 Requested by: Jinny Sanders, MD [place],  [place] [address] PCP: Jinny Sanders, MD  Subjective: Chief Complaint  Patient presents with  . Right Shoulder - Pain    HPI: Christine Barrett is a 59 y.o. female who presents to the office complaining of right shoulder pain.  Patient had a fall in May when her knee gave way on her.  She landed on her right shoulder.  She is right-hand dominant.  She notes pain since the fall that has been worsening.  She was seen by Dr. Edilia Bo who gave her an injection into the subacromial space after ordering an MRI that revealed a partial thickness tear of the supraspinatus.  PET scan is reviewed with the patient today.  The tear is somewhat underwhelming and consistent with age-related degeneration of the rotator cuff tear.  However, not noted on the MRI scan is the thickening of the capsule within the axillary recess consistent with early frozen shoulder.  She notes difficulty lifting her arm.  She has taken tramadol without significant relief.  She has not had any physical therapy.  She does complain of stiffness.  She has some neck pain but denies any numbness or tingling..                ROS:  All systems reviewed are negative as they relate to the chief complaint within the history of present illness.  Patient denies fevers or chills.  Assessment & Plan: Visit Diagnoses:  1. Adhesive capsulitis of right shoulder   2. Right shoulder pain, unspecified chronicity   3. Tear of right supraspinatus tendon     Plan: Patient is a 59 year old female presents complaining of right shoulder pain.  She has had pain since a fall in May.  Pain is waking her up at night.  MRI reviewed which revealed partial thickness tear of the supraspinatus as well as thickening of the inferior capsule on our review of the study..  Patient  has no weakness on exam but she does have pain worse with passive range of motion of the shoulder.  She has fairly reduced passive range of motion of the right shoulder compared with the left.  Impression is right frozen shoulder in addition to the right shoulder supraspinatus partial-thickness tear.  Plan for intra-articular cortisone injection by Dr. Ernestina Patches today.  Referred patient to physical therapy to work on rotator cuff strengthening and passive/active range of motion.  Follow-up in 6 weeks for clinical recheck.  We also provided some home exercises for her to do until she gets in the physical therapy which focused on passive and active assisted range of motion of the shoulder.  Follow-Up Instructions: No follow-ups on file.   Orders:  Orders Placed This Encounter  Procedures  . Large Joint Inj: R glenohumeral  . XR C-ARM NO REPORT  . Ambulatory referral to Physical Therapy   No orders of the defined types were placed in this encounter.     Procedures: No procedures performed   Clinical Data: No additional findings.  Objective: Vital Signs: There were no vitals taken for this visit.  Physical Exam:  Constitutional: Patient appears well-developed HEENT:  Head: Normocephalic Eyes:EOM are normal Neck: Normal range of motion Cardiovascular: Normal rate Pulmonary/chest: Effort normal  Neurologic: Patient is alert Skin: Skin is warm Psychiatric: Patient has normal mood and affect  Ortho Exam:  Right shoulder Exam 120 degrees forward flexion of the right shoulder compared with 180 degrees of the left shoulder.  80 degrees abduction of the right shoulder compared with 140 degrees on the left shoulder.  60 degrees external rotation of the right shoulder compared with 80 degrees external rotation of the left. Good subscapularis, supraspinatus, and infraspinatus strength.  Pain elicited with supraspinatus resistance testing. 5/5 grip strength, forearm pronation/supination, and  bicep strength  Specialty Comments:  No specialty comments available.  Imaging: XR C-ARM NO REPORT  Result Date: 12/22/2019 Please see Notes tab for imaging impression.    PMFS History: Patient Active Problem List   Diagnosis Date Noted  . COVID-19 virus infection 06/28/2019  . Ear pain, left 03/30/2019  . Nausea 02/08/2018  . Left anterior knee pain 08/28/2016  . OSA (obstructive sleep apnea) 06/10/2016  . Costochondritis 06/09/2016  . Prediabetes 05/25/2016  . Crescendo angina ([place]) 08/05/2015  . Essential hypertension   . GERD (gastroesophageal reflux disease) 08/10/2014  . Menopausal syndrome 08/10/2014  . Accelerated hypertension with heart disease and without congestive heart failure 10/03/2013  . CAD S/P percutaneous coronary angioplasty - Ostial AVG Cx - Scoring balloon PTCA 10/03/2013  . Hyperlipidemia with target LDL less than 70 10/02/2013  . H/O non-ST elevation myocardial infarction (NSTEMI) 10/02/2013  . Normocytic anemia, not due to blood loss   . Allergic rhinitis 03/17/2007  . MIGRAINE, COMMON W/O INTRACTABLE MIGRAINE 03/16/2007   Past Medical History:  Diagnosis Date  . Allergy    SEASONAL  . Anemia   . CAD S/P percutaneous coronary angioplasty 09/2013   a) Ostial AV G Cx - 2.5 mm Angiosculpt; mid LAD 40-60%; b) Myoview 07/2014: LOW RISK, small-severe fixed inferior defect c/w infarct w/o peri-infarct ischemia.  . Chronic bronchitis ([place])    "frequently; not q yr" (10/03/2013)  . Diverticulosis   . Essential hypertension    with prior Accelerated HTN  . GERD (gastroesophageal reflux disease)   . Hiatal hernia   . Hx of non-ST elevation myocardial infarction (NSTEMI) 10/01/2013   Due to Accelerated HTN with existing CAD  . Hyperlipemia   . Migraine    "@ least once/month" (10/03/2013)  . OSA (obstructive sleep apnea) 06/10/2016  . Schatzki's ring   . Seasonal allergies   . Sinusitis     Family History  Adopted: Yes  Problem Relation Age of Onset    . Diabetes Mother   . Breast cancer Sister   . Hypertension Sister   . Colon cancer Neg Hx   . Heart attack Neg Hx   . Stroke Neg Hx     Past Surgical History:  Procedure Laterality Date  . ABDOMINAL HYSTERECTOMY  1994   "partial"  . CARDIAC CATHETERIZATION N/A 08/05/2015   Procedure: Left Heart Cath and Coronary Angiography;  Surgeon: [name], MD;  Location: [facility];  Service: Cardiovascular;  Laterality: N/A;  . CESAREAN SECTION  1989  . COLONOSCOPY    . CORONARY ANGIOPLASTY  10/03/2013   95% ostial AV G Cx - 2.5 mm AngioSculpt Balloon PTCA; mid LAD 40-60%  . LEFT HEART CATHETERIZATION WITH CORONARY ANGIOGRAM N/A 10/02/2013   Procedure: LEFT HEART CATHETERIZATION WITH CORONARY ANGIOGRAM;  Surgeon: [name], MD;  Location: [facility] CATH [facility];  Service: Cardiovascular;  Laterality: N/A;  . NASAL SEPTOPLASTY W/ TURBINOPLASTY  ~ 2007  . NM MYOVIEW  LTD  08/22/2014    Low risk stress nuclear study with a small, severe, fixed defect in the distal inferior wall/apex suggestive of small prior infarct; no ischemia.  LV Wall Motion: NL LV Function; NL Wall Motion  . PERCUTANEOUS CORONARY STENT INTERVENTION (PCI-S) N/A 10/03/2013   cutting balloon angioplasty only no stent.   . TRANSTHORACIC ECHOCARDIOGRAM  10/02/2013   EF 55-60%; mild LVH, no RWMA,   . TUBAL LIGATION  1989   Social History   Occupational History  . Occupation: Research scientist (life sciences)  Tobacco Use  . Smoking status: Never Smoker  . Smokeless tobacco: Never Used  Vaping Use  . Vaping Use: Never used  Substance and Sexual Activity  . Alcohol use: Yes    Alcohol/week: 0.0 standard drinks    Comment: occ  . Drug use: No  . Sexual activity: Yes    Birth control/protection: Surgical

## 2020-01-03 ENCOUNTER — Other Ambulatory Visit: Payer: Self-pay

## 2020-01-08 ENCOUNTER — Telehealth: Payer: Self-pay

## 2020-01-08 ENCOUNTER — Other Ambulatory Visit: Payer: Self-pay

## 2020-01-08 NOTE — Telephone Encounter (Signed)
Pt c/o bilateral leg cramping for close to 2 wks from calf to groin and her legs are sore due to the cramping. Pt reports staying well hydrated and taking K and Mg supplements and eating bananas. She also has bilateral foot edema x 3 days but has hx of foot edema with the left foot normally worse than the right. She reports normally elevation dissipates the edema but this has not helped currently.  Pt reports the leg cramping happened after a fall in a parking lot a few months ago where she obtained a torn rotator cuff. She is starting PT tomorrow for it.  Pt denies any current chest pain or SOB. Made apt with PCP for tomorrow and advised if any symptoms change or she has any SOB or chest pain to contact office. Pt verbalized understanding.

## 2020-01-09 ENCOUNTER — Ambulatory Visit: Payer: 59 | Admitting: Family Medicine

## 2020-01-09 ENCOUNTER — Ambulatory Visit (INDEPENDENT_AMBULATORY_CARE_PROVIDER_SITE_OTHER): Payer: Self-pay | Admitting: Physical Therapy

## 2020-01-09 ENCOUNTER — Ambulatory Visit (INDEPENDENT_AMBULATORY_CARE_PROVIDER_SITE_OTHER)
Admission: RE | Admit: 2020-01-09 | Discharge: 2020-01-09 | Disposition: A | Discharge status: Home / Self Care | Payer: 59 | Source: Ambulatory Visit | Attending: Family Medicine | Admitting: Family Medicine

## 2020-01-09 ENCOUNTER — Encounter: Payer: Self-pay | Admitting: Family Medicine

## 2020-01-09 ENCOUNTER — Encounter: Payer: Self-pay | Admitting: Physical Therapy

## 2020-01-09 VITALS — BP 124/76 | HR 101 | Temp 97.4°F | Ht 69.0 in | Wt 288.2 lb

## 2020-01-09 DIAGNOSIS — M6281 Muscle weakness (generalized): Secondary | ICD-10-CM

## 2020-01-09 DIAGNOSIS — M545 Low back pain, unspecified: Secondary | ICD-10-CM | POA: Insufficient documentation

## 2020-01-09 DIAGNOSIS — M25562 Pain in left knee: Secondary | ICD-10-CM

## 2020-01-09 DIAGNOSIS — R609 Edema, unspecified: Secondary | ICD-10-CM

## 2020-01-09 DIAGNOSIS — I83892 Varicose veins of left lower extremities with other complications: Secondary | ICD-10-CM | POA: Insufficient documentation

## 2020-01-09 DIAGNOSIS — M25511 Pain in right shoulder: Secondary | ICD-10-CM

## 2020-01-09 DIAGNOSIS — R252 Cramp and spasm: Secondary | ICD-10-CM

## 2020-01-09 DIAGNOSIS — M25611 Stiffness of right shoulder, not elsewhere classified: Secondary | ICD-10-CM

## 2020-01-09 NOTE — Assessment & Plan Note (Addendum)
?   Due to OA.. ttp at left lateral joint line.  Eval with X-ray.  No falls recently.  Can use diclofenac gel QID for pain and inflammation.

## 2020-01-09 NOTE — Patient Instructions (Addendum)
Please stop at the lab to have labs drawn.  I will call you with X-ray results.  Keep up water.  Hold potassium for now. Can try diclofenac gel on left knee four times a day.  Continue to hold atorvastatin.

## 2020-01-09 NOTE — Assessment & Plan Note (Signed)
Eval with labs. Hold potassium as on ARB and may be overtreating.

## 2020-01-09 NOTE — Patient Instructions (Signed)
Access Code: [email or web address] URL: [email or web address] Date: 01/09/2020 Prepared by: Elsie Ra  Exercises Shoulder Flexion Wall Slide with Towel - 2 x daily - 6 x weekly - 3 sets - 10 reps Standing Shoulder Abduction Slides at Wall - 2 x daily - 6 x weekly - 10 reps - 1-2 sets Seated Shoulder Inferior Glide in Abduction Below 90 - 2 x daily - 6 x weekly - 3 sets - 10 reps Standing Shoulder Internal Rotation Stretch with Towel - 2 x daily - 6 x weekly - 10 reps - 1 sets - 10 sec hold Standing Row with Anchored Resistance - 2 x daily - 6 x weekly - 10-20 reps - 2-3 sets Standing Shoulder External Rotation with Resistance - 2 x daily - 6 x weekly - 10 reps - 1-3 sets

## 2020-01-09 NOTE — Assessment & Plan Note (Signed)
Chronic.. ? If related to leg pain. Consider eval for spinal stenosis.

## 2020-01-09 NOTE — Therapy (Signed)
[address] [place], [name], [address] Phone: [phone]   Fax:  [phone]  Physical Therapy Evaluation  Patient Details  Name: Christine Barrett MRN: [id number] Date of Birth: 1960/08/03 Referring Provider (PT): [name], MD   Encounter Date: 01/09/2020   PT End of Session - 01/09/20 0931    Visit Number 1    Number of Visits 4    Date for PT Re-Evaluation 02/06/20    PT Start Time 0850    PT Stop Time 0920    PT Time Calculation (min) 30 min    Behavior During Therapy [facility] for tasks assessed/performed           Past Medical History:  Diagnosis Date   Allergy    SEASONAL   Anemia    CAD S/P percutaneous coronary angioplasty 09/2013   a) Ostial AV G Cx - 2.5 mm Angiosculpt; mid LAD 40-60%; b) Myoview 07/2014: LOW RISK, small-severe fixed inferior defect c/w infarct w/o peri-infarct ischemia.   Chronic bronchitis ([place])    "frequently; not q yr" (10/03/2013)   Diverticulosis    Essential hypertension    with prior Accelerated HTN   GERD (gastroesophageal reflux disease)    Hiatal hernia    Hx of non-ST elevation myocardial infarction (NSTEMI) 10/01/2013   Due to Accelerated HTN with existing CAD   Hyperlipemia    Migraine    "@ least once/month" (10/03/2013)   OSA (obstructive sleep apnea) 06/10/2016   Schatzki's ring    Seasonal allergies    Sinusitis     Past Surgical History:  Procedure Laterality Date   ABDOMINAL HYSTERECTOMY  1994   "partial"   CARDIAC CATHETERIZATION N/A 08/05/2015   Procedure: Left Heart Cath and Coronary Angiography;  Surgeon: [name], MD;  Location: [place] CV LAB;  Service: Cardiovascular;  Laterality: N/A;   CESAREAN SECTION  1989   COLONOSCOPY     CORONARY ANGIOPLASTY  10/03/2013   95% ostial AV G Cx - 2.5 mm AngioSculpt Balloon PTCA; mid LAD 40-60%   LEFT HEART CATHETERIZATION WITH CORONARY ANGIOGRAM N/A 10/02/2013   Procedure: LEFT HEART CATHETERIZATION WITH  CORONARY ANGIOGRAM;  Surgeon: [name], MD;  Location: [facility] CATH LAB;  Service: Cardiovascular;  Laterality: N/A;   NASAL SEPTOPLASTY W/ TURBINOPLASTY  ~ 2007   NM MYOVIEW LTD  08/22/2014    Low risk stress nuclear study with a small, severe, fixed defect in the distal inferior wall/apex suggestive of small prior infarct; no ischemia.  LV Wall Motion: NL LV Function; NL Wall Motion   PERCUTANEOUS CORONARY STENT INTERVENTION (PCI-S) N/A 10/03/2013   cutting balloon angioplasty only no stent.    TRANSTHORACIC ECHOCARDIOGRAM  10/02/2013   EF 55-60%; mild LVH, no RWMA,    TUBAL LIGATION  1989    There were no vitals filed for this visit.    Subjective Assessment - 01/09/20 0854    Subjective Rt shoulder pain, adhesive capsulitis, she had a fall in May when her knee gave way on her.  She landed on her right shoulder.  She is right-hand dominant and works as an Research scientist (life sciences).  She notes pain since the fall that has been worsening.  She was seen by Dr. Edilia Bo who gave her an injection into the subacromial space after ordering an MRI that revealed a partial thickness tear of the supraspinatus. She says she is not having any pain right now since the injection and now can move her arm much bettter.  Pertinent History PMH: CAD,bronchitis,HTNN,NSTEMI    Limitations Lifting;House hold activities    Diagnostic tests MRI that revealed a "partial thickness tear of the supraspinatus, and frozen shoulder"    Patient Stated Goals get arm back to normal    Currently in Pain? No/denies   relays severe pain before the injeciton down her right arm             [facility] PT Assessment - 01/09/20 0001      Assessment   Medical Diagnosis Rt shoulder adhesive capsulitis and partial RTC tear supraspinatus    Referring Provider (PT) [name], MD    Onset Date/Surgical Date --   Fall on May 8th landing on Rt shoulder   Hand Dominance Right    Next MD Visit 02/02/20    Prior Therapy none      Precautions    Precautions None      Restrictions   Weight Bearing Restrictions No      Balance Screen   Has the patient fallen in the past 6 months Yes    How many times? 1    Has the patient had a decrease in activity level because of a fear of falling?  No    Is the patient reluctant to leave their home because of a fear of falling?  No      [place] residence      Prior Function   Level of Independence Independent    Vocation Full time employment    Hydrographic surveyor    Leisure travel      Cognition   Overall Cognitive Status Within Functional Limits for tasks assessed      Sensation   Light Touch Appears Intact      ROM / Strength   AROM / PROM / Strength AROM;Strength      AROM   AROM Assessment Site Shoulder    Right/Left Shoulder Right;Left    Right Shoulder Flexion 160 Degrees    Right Shoulder ABduction 150 Degrees    Right Shoulder Internal Rotation --   L4   Right Shoulder External Rotation --   C6   Left Shoulder Flexion 170 Degrees    Left Shoulder ABduction 150 Degrees    Left Shoulder Internal Rotation --   L3   Left Shoulder External Rotation --   T1     Strength   Overall Strength Comments grip strength 4+ on Rt    Strength Assessment Site Shoulder    Right/Left Shoulder Right    Right Shoulder Flexion 5/5    Right Shoulder ABduction 4+/5    Right Shoulder Internal Rotation 5/5    Right Shoulder External Rotation 4+/5      Palpation   Palpation comment mild GH restriction with inferior glide                      Objective measurements completed on examination: See above findings.               PT Education - 01/09/20 0931    Education Details HEP, POC    Person(s) Educated Patient    Methods Explanation;Demonstration;Verbal cues;Handout    Comprehension Verbalized understanding;Returned demonstration               PT Long Term Goals - 01/09/20 0935      PT LONG  TERM GOAL #1   Title Pt will be I and compliant with HEP.  Time 4    Period Weeks    Status New    Target Date 02/06/20      PT LONG TERM GOAL #2   Title Pt will improve Rt shoulder strength to 5/5    Time 4    Period Weeks    Status New      PT LONG TERM GOAL #3   Title Pt will improve Rt shoulder ROM to WNL    Time 4    Period Weeks    Status New                  Plan - 01/09/20 0932    Clinical Impression Statement Pt had previous complaints of Rt shoulder pain and stiffness after fall in May 2020 landing on her Rt arm. She has had injections and has improved greatly since. She now only has mild deficits in Rt shoulder ROM, GH mobility and strength. She will likely not need much PT if she will perform her HEP which was printed and reviewed with her today.    Personal Factors and Comorbidities Comorbidity 3+    Comorbidities PMH: CAD,bronchitis,HTNN,NSTEMI    Examination-Activity Limitations Carry;Lift;Reach Overhead    Examination-Participation Restrictions Driving;Laundry;Yard Work    Stability/Clinical Decision Making Stable/Uncomplicated    Clinical Decision Making Low    Rehab Potential Good    PT Frequency 1x / week    PT Duration 4 weeks    PT Treatment/Interventions ADLs/Self Care Home Management;Cryotherapy;Electrical Stimulation;Iontophoresis 4mg /ml Dexamethasone;Moist Heat;Ultrasound;Therapeutic exercise;Neuromuscular re-education;Manual techniques;Passive range of motion;Dry needling;Joint Manipulations;Taping;Vasopneumatic Device    PT Next Visit Plan may be one visit only but if she returns reivew and updated HEP PRN, GH mobs and RTC strength.    PT Home Exercise Plan see pt instructions    Consulted and Agree with Plan of Care Patient           Patient will benefit from skilled therapeutic intervention in order to improve the following deficits and impairments:  Decreased mobility, Decreased range of motion, Decreased strength, Increased muscle  spasms, Pain, Postural dysfunction  Visit Diagnosis: Acute pain of right shoulder  Stiffness of right shoulder joint  Muscle weakness (generalized)     Problem List Patient Active Problem List   Diagnosis Date Noted   COVID-19 virus infection 06/28/2019   Ear pain, left 03/30/2019   Nausea 02/08/2018   Left anterior knee pain 08/28/2016   OSA (obstructive sleep apnea) 06/10/2016   Costochondritis 06/09/2016   Prediabetes 05/25/2016   Crescendo angina ([place]) 08/05/2015   Essential hypertension    GERD (gastroesophageal reflux disease) 08/10/2014   Menopausal syndrome 08/10/2014   Accelerated hypertension with heart disease and without congestive heart failure 10/03/2013   CAD S/P percutaneous coronary angioplasty - Ostial AVG Cx - Scoring balloon PTCA 10/03/2013   Hyperlipidemia with target LDL less than 70 10/02/2013   H/O non-ST elevation myocardial infarction (NSTEMI) 10/02/2013   Normocytic anemia, not due to blood loss    Allergic rhinitis 03/17/2007   MIGRAINE, COMMON W/O INTRACTABLE MIGRAINE 03/16/2007    Silvestre Mesi 01/09/2020, 9:41 AM  [facility] [address] [place], [name], [address] Phone: [phone]   Fax:  [phone]  Name: Christine Barrett MRN: [id number] Date of Birth: 1960-06-30

## 2020-01-09 NOTE — Assessment & Plan Note (Signed)
Primarily nonpitting edema in left leg, minimal in office today but was worse previously ( patient has picture.

## 2020-01-09 NOTE — Progress Notes (Signed)
Chief Complaint  Patient presents with  . Leg Cramps    "Muscles fells twisted"  . Knee Pain    Left  . Foot Swelling    History of Present Illness: HPI   59 year old female with HTN, CAD presents with new onset leg cramps in  Both legs, upper and lower off and on  Since 12/29/2019.  Muscles twisting and tight.  She has tried salt, mustard, gatorade like drink, eating bananas and drinking water.  Started magnesium and potassium last week. tried CBD oil.. did not help.    Left knee pain and left leg swelling in last 6 days.  when walking pain in left knee radiates to left lateral hip.  Low back pain except if standing a long time( improves if sits), no numbness no weakness.   No new meds   no SOB, no CP. Wt Readings from Last 3 Encounters:  01/09/20 288 lb 4 oz (130.7 kg)  12/13/19 285 lb 12 oz (129.6 kg)  11/30/19 281 lb 8 oz (127.7 kg)     Not using atorvastatin... given cramping in past. Has been on amlodipine long term.      This visit occurred during the SARS-CoV-2 public health emergency.  Safety protocols were in place, including screening questions prior to the visit, additional usage of staff PPE, and extensive cleaning of exam room while observing appropriate contact time as indicated for disinfecting solutions.   COVID 19 screen:  No recent travel or known exposure to COVID19 The patient denies respiratory symptoms of COVID 19 at this time. The importance of social distancing was discussed today.     Review of Systems  Constitutional: Negative for chills and fever.  HENT: Negative for congestion and ear pain.   Eyes: Negative for pain and redness.  Respiratory: Negative for cough and shortness of breath.   Cardiovascular: Positive for leg swelling. Negative for chest pain and palpitations.  Gastrointestinal: Negative for abdominal pain, blood in stool, constipation, diarrhea, nausea and vomiting.  Genitourinary: Negative for dysuria.  Musculoskeletal:  Negative for falls and myalgias.  Skin: Negative for rash.  Neurological: Negative for dizziness.  Psychiatric/Behavioral: Negative for depression. The patient is not nervous/anxious.       Past Medical History:  Diagnosis Date  . Allergy    SEASONAL  . Anemia   . CAD S/P percutaneous coronary angioplasty 09/2013   a) Ostial AV G Cx - 2.5 mm Angiosculpt; mid LAD 40-60%; b) Myoview 07/2014: LOW RISK, small-severe fixed inferior defect c/w infarct w/o peri-infarct ischemia.  . Chronic bronchitis ([place])    "frequently; not q yr" (10/03/2013)  . Diverticulosis   . Essential hypertension    with prior Accelerated HTN  . GERD (gastroesophageal reflux disease)   . Hiatal hernia   . Hx of non-ST elevation myocardial infarction (NSTEMI) 10/01/2013   Due to Accelerated HTN with existing CAD  . Hyperlipemia   . Migraine    "@ least once/month" (10/03/2013)  . OSA (obstructive sleep apnea) 06/10/2016  . Schatzki's ring   . Seasonal allergies   . Sinusitis     reports that she has never smoked. She has never used smokeless tobacco. She reports current alcohol use. She reports that she does not use drugs.   Current Outpatient Medications:  .  albuterol (PROVENTIL HFA;VENTOLIN HFA) 108 (90 Base) MCG/ACT inhaler, INHALE 2 PUFFS BY MOUTH EVERY 6 HOURS AS NEEDED FOR WHEEZING OR  SHORTNESS  OF  BREATH, Disp: 18 each, Rfl: 0 .  amLODipine (NORVASC) 5 MG tablet, Take 1 tablet (5 mg total) by mouth daily., Disp: 90 tablet, Rfl: 3 .  aspirin EC 81 MG EC tablet, Take 1 tablet (81 mg total) by mouth daily., Disp: , Rfl:  .  atorvastatin (LIPITOR) 80 MG tablet, Take 1 tablet (80 mg total) by mouth daily., Disp: 90 tablet, Rfl: 3 .  carvedilol (COREG) 25 MG tablet, Take 1 tablet (25 mg total) by mouth 2 (two) times daily with a meal., Disp: 180 tablet, Rfl: 3 .  diazepam (VALIUM) 2 MG tablet, 1 tab 45 minutes before MRI, may repeat if needed, Disp: 2 tablet, Rfl: 0 .  ezetimibe (ZETIA) 10 MG tablet, Take 1  tablet (10 mg total) by mouth daily., Disp: 30 tablet, Rfl: 11 .  famotidine (PEPCID) 20 MG tablet, Take 20 mg by mouth 2 (two) times daily., Disp: , Rfl:  .  fluticasone (FLONASE) 50 MCG/ACT nasal spray, Place 2 sprays into both nostrils daily., Disp: 16 g, Rfl: 0 .  ibuprofen (ADVIL) 800 MG tablet, Take 1 tablet (800 mg total) by mouth every 8 (eight) hours as needed., Disp: 30 tablet, Rfl: 0 .  isosorbide mononitrate (IMDUR) 60 MG 24 hr tablet, Take 2 tablets (120 mg total) by mouth daily., Disp: 180 tablet, Rfl: 3 .  losartan (COZAAR) 100 MG tablet, Take 1 tablet (100 mg total) by mouth daily., Disp: 90 tablet, Rfl: 3 .  montelukast (SINGULAIR) 10 MG tablet, TAKE 1 TABLET BY MOUTH AT BEDTIME, Disp: 90 tablet, Rfl: 3 .  Multiple Vitamin (MULTIVITAMIN WITH MINERALS) TABS tablet, Take 1 tablet by mouth daily., Disp: , Rfl:  .  Multiple Vitamins-Minerals (EMERGEN-C IMMUNE) PACK, Take 1 packet by mouth daily., Disp: , Rfl:  .  pantoprazole (PROTONIX) 40 MG tablet, Take 1 tablet by mouth once daily, Disp: 90 tablet, Rfl: 3   Observations/Objective: Blood pressure 124/76, pulse (!) 101, temperature (!) 97.4 F (36.3 C), temperature source Temporal, height 5\' 9"  (1.753 m), weight 288 lb 4 oz (130.7 kg), SpO2 98 %.  Physical Exam Constitutional:      General: She is not in acute distress.    Appearance: Normal appearance. She is well-developed. She is morbidly obese. She is not ill-appearing or toxic-appearing.  HENT:     Head: Normocephalic.     Right Ear: Hearing, tympanic membrane, ear canal and external ear normal. Tympanic membrane is not erythematous, retracted or bulging.     Left Ear: Hearing, tympanic membrane, ear canal and external ear normal. Tympanic membrane is not erythematous, retracted or bulging.     Nose: No mucosal edema or rhinorrhea.     Right Sinus: No maxillary sinus tenderness or frontal sinus tenderness.     Left Sinus: No maxillary sinus tenderness or frontal sinus  tenderness.     Mouth/Throat:     Pharynx: Uvula midline.  Eyes:     General: Lids are normal. Lids are everted, no foreign bodies appreciated.     Conjunctiva/sclera: Conjunctivae normal.     Pupils: Pupils are equal, round, and reactive to light.  Neck:     Thyroid: No thyroid mass or thyromegaly.     Vascular: No carotid bruit.     Trachea: Trachea normal.  Cardiovascular:     Rate and Rhythm: Normal rate and regular rhythm.     Pulses: Normal pulses.     Heart sounds: Normal heart sounds, S1 normal and S2 normal. No murmur heard.  No friction rub. No gallop.  Pulmonary:     Effort: Pulmonary effort is normal. No tachypnea or respiratory distress.     Breath sounds: Normal breath sounds. No decreased breath sounds, wheezing, rhonchi or rales.  Abdominal:     General: Bowel sounds are normal.     Palpations: Abdomen is soft.     Tenderness: There is no abdominal tenderness.  Musculoskeletal:     Cervical back: Normal range of motion and neck supple.     Lumbar back: Tenderness present. No bony tenderness. Normal range of motion. Negative right straight leg raise test and negative left straight leg raise test.     Left knee: No swelling, effusion or erythema. Decreased range of motion. Tenderness present over the lateral joint line.     Right ankle: Tenderness present. Decreased range of motion.     Left ankle: Tenderness present. Decreased range of motion.     Comments: ttp central low back and in bilateral sciatic notch  Skin:    General: Skin is warm and dry.     Findings: No rash.  Neurological:     Mental Status: She is alert.     Comments: tipas of toes bialterally numb to palpation and light touch  Psychiatric:        Mood and Affect: Mood is not anxious or depressed.        Speech: Speech normal.        Behavior: Behavior normal. Behavior is cooperative.        Thought Content: Thought content normal.        Judgment: Judgment normal.      Assessment and  Plan   Peripheral edema Primarily nonpitting edema in left leg, minimal in office today but was worse previously ( patient has picture.  Acute pain of left knee ? Due to OA.. ttp at left lateral joint line.  Eval with X-ray.  No falls recently.  Can use diclofenac gel QID for pain and inflammation.  Acute midline low back pain Chronic.. ? If related to leg pain. Consider eval for spinal stenosis.  Less likely DVT in left leg.. no current swelling, but consider [place] eval if initial work up negative.   Eliezer Lofts, MD

## 2020-01-10 DIAGNOSIS — M7989 Other specified soft tissue disorders: Secondary | ICD-10-CM

## 2020-01-10 DIAGNOSIS — M79662 Pain in left lower leg: Secondary | ICD-10-CM

## 2020-01-10 LAB — COMPREHENSIVE METABOLIC PANEL
ALT: 21 U/L (ref 0–35)
AST: 15 U/L (ref 0–37)
Albumin: 4.2 g/dL (ref 3.5–5.2)
Alkaline Phosphatase: 66 U/L (ref 39–117)
BUN: 17 mg/dL (ref 6–23)
CO2: 28 mEq/L (ref 19–32)
Calcium: 9.7 mg/dL (ref 8.4–10.5)
Chloride: 106 mEq/L (ref 96–112)
Creatinine, Ser: 0.92 mg/dL (ref 0.40–1.20)
GFR: 75.81 mL/min (ref >60.00)
Glucose, Bld: 106 mg/dL — ABNORMAL HIGH (ref 70–99)
Potassium: 4 mEq/L (ref 3.5–5.1)
Sodium: 142 mEq/L (ref 135–145)
Total Bilirubin: 0.8 mg/dL (ref 0.2–1.2)
Total Protein: 7 g/dL (ref 6.0–8.3)

## 2020-01-10 LAB — CBC WITH DIFFERENTIAL/PLATELET
Basophils Absolute: 0 10*3/uL (ref 0.0–0.1)
Basophils Relative: 0.3 % (ref 0.0–3.0)
Eosinophils Absolute: 0.2 10*3/uL (ref 0.0–0.7)
Eosinophils Relative: 2.7 % (ref 0.0–5.0)
HCT: 36.8 % (ref 36.0–46.0)
Hemoglobin: 12 g/dL (ref 12.0–15.0)
Lymphocytes Relative: 31.1 % (ref 12.0–46.0)
Lymphs Abs: 2.6 10*3/uL (ref 0.7–4.0)
MCHC: 32.7 g/dL (ref 30.0–36.0)
MCV: 85.8 fl (ref 78.0–100.0)
Monocytes Absolute: 0.7 10*3/uL (ref 0.1–1.0)
Monocytes Relative: 8.7 % (ref 3.0–12.0)
Neutro Abs: 4.7 10*3/uL (ref 1.4–7.7)
Neutrophils Relative %: 57.2 % (ref 43.0–77.0)
Platelets: 230 10*3/uL (ref 150.0–400.0)
RBC: 4.29 Mil/uL (ref 3.87–5.11)
RDW: 15.1 % (ref 11.5–15.5)
WBC: 8.3 10*3/uL (ref 4.0–10.5)

## 2020-01-10 LAB — BRAIN NATRIURETIC PEPTIDE: Pro B Natriuretic peptide (BNP): 81 pg/mL (ref 0.0–100.0)

## 2020-01-10 LAB — TSH: TSH: 1.67 u[IU]/mL (ref 0.35–4.50)

## 2020-01-10 LAB — CK: Total CK: 136 U/L (ref 7–177)

## 2020-01-10 LAB — MAGNESIUM: Magnesium: 1.8 mg/dL (ref 1.5–2.5)

## 2020-01-12 NOTE — Addendum Note (Signed)
Addended by: Eliezer Lofts E on: 01/12/2020 08:50 AM   Modules accepted: Orders

## 2020-01-15 ENCOUNTER — Ambulatory Visit (HOSPITAL_COMMUNITY)
Admission: RE | Admit: 2020-01-15 | Discharge: 2020-01-15 | Disposition: A | Discharge status: Home / Self Care | Payer: 59 | Source: Ambulatory Visit | Attending: Family Medicine | Admitting: Family Medicine

## 2020-01-15 ENCOUNTER — Other Ambulatory Visit: Payer: Self-pay

## 2020-01-15 ENCOUNTER — Telehealth: Payer: Self-pay

## 2020-01-15 DIAGNOSIS — M79662 Pain in left lower leg: Secondary | ICD-10-CM | POA: Insufficient documentation

## 2020-01-15 DIAGNOSIS — M7989 Other specified soft tissue disorders: Secondary | ICD-10-CM | POA: Insufficient documentation

## 2020-01-15 NOTE — Telephone Encounter (Signed)
I received a call report from [place] with radiology stating that patient's LE Venous from today was negative for DVT.  Will route to Dr. [name].

## 2020-01-15 NOTE — Progress Notes (Signed)
VASCULAR LAB PRELIMINARY  PRELIMINARY  PRELIMINARY  PRELIMINARY  Left lower extremity venous duplex completed.    Preliminary report:  See CV proc for preliminary results.   Called report to   [facility], Zionna Homewood, RVT 01/15/2020, 3:23 PM

## 2020-01-16 ENCOUNTER — Telehealth: Payer: Self-pay

## 2020-01-16 MED ORDER — HYDROCHLOROTHIAZIDE 25 MG PO TABS
25.0000 mg | ORAL_TABLET | Freq: Every day | ORAL | 0 refills | Status: DC
Start: 2020-01-16 — End: 2020-06-13

## 2020-01-16 NOTE — Telephone Encounter (Signed)
Patient contacted the office and states she was returning a phone call to Butch Penny. Butch Penny, I do not see anything where you tried to reach her. Please advise.

## 2020-01-16 NOTE — Addendum Note (Signed)
Addended by: Carter Kitten on: 01/16/2020 03:09 PM   Modules accepted: Orders

## 2020-01-16 NOTE — Telephone Encounter (Signed)
See result note from Doppler Study from 01/15/2020.

## 2020-01-16 NOTE — Addendum Note (Signed)
Addended by: Carter Kitten on: 01/16/2020 03:13 PM   Modules accepted: Orders

## 2020-01-17 ENCOUNTER — Encounter: Payer: Self-pay | Admitting: Physical Therapy

## 2020-01-17 ENCOUNTER — Ambulatory Visit (INDEPENDENT_AMBULATORY_CARE_PROVIDER_SITE_OTHER): Payer: 59 | Admitting: Physical Therapy

## 2020-01-17 ENCOUNTER — Telehealth: Payer: Self-pay

## 2020-01-17 ENCOUNTER — Other Ambulatory Visit: Payer: Self-pay

## 2020-01-17 DIAGNOSIS — M6281 Muscle weakness (generalized): Secondary | ICD-10-CM | POA: Diagnosis not present

## 2020-01-17 DIAGNOSIS — I252 Old myocardial infarction: Secondary | ICD-10-CM

## 2020-01-17 DIAGNOSIS — M25511 Pain in right shoulder: Secondary | ICD-10-CM

## 2020-01-17 DIAGNOSIS — M25611 Stiffness of right shoulder, not elsewhere classified: Secondary | ICD-10-CM

## 2020-01-17 DIAGNOSIS — I119 Hypertensive heart disease without heart failure: Secondary | ICD-10-CM

## 2020-01-17 DIAGNOSIS — Z9861 Coronary angioplasty status: Secondary | ICD-10-CM

## 2020-01-17 DIAGNOSIS — I2510 Atherosclerotic heart disease of native coronary artery without angina pectoris: Secondary | ICD-10-CM

## 2020-01-17 NOTE — Therapy (Addendum)
Christine Barrett, [name], [address] Phone: [phone]   Fax:  [phone]  Physical Therapy Treatment  Patient Details  Name: Christine Barrett MRN: [id number] Date of Birth: 1960-08-21 Referring Provider (PT): [name], MD   Encounter Date: 01/17/2020   PT End of Session - 01/17/20 0900    Visit Number 2    Number of Visits 4    Date for PT Re-Evaluation 02/06/20    PT Start Time 0848    PT Stop Time 0929    PT Time Calculation (min) 41 min    Activity Tolerance Patient tolerated treatment well    Behavior During Therapy [facility] for tasks assessed/performed           Past Medical History:  Diagnosis Date  . Allergy    SEASONAL  . Anemia   . CAD S/P percutaneous coronary angioplasty 09/2013   a) Ostial AV G Cx - 2.5 mm Angiosculpt; mid LAD 40-60%; b) Myoview 07/2014: LOW RISK, small-severe fixed inferior defect c/w infarct w/o peri-infarct ischemia.  . Chronic bronchitis ([place])    "frequently; not q yr" (10/03/2013)  . Diverticulosis   . Essential hypertension    with prior Accelerated HTN  . GERD (gastroesophageal reflux disease)   . Hiatal hernia   . Hx of non-ST elevation myocardial infarction (NSTEMI) 10/01/2013   Due to Accelerated HTN with existing CAD  . Hyperlipemia   . Migraine    "@ least once/month" (10/03/2013)  . OSA (obstructive sleep apnea) 06/10/2016  . Schatzki's ring   . Seasonal allergies   . Sinusitis     Past Surgical History:  Procedure Laterality Date  . ABDOMINAL HYSTERECTOMY  1994   "partial"  . CARDIAC CATHETERIZATION N/A 08/05/2015   Procedure: Left Heart Cath and Coronary Angiography;  Surgeon: [name], MD;  Location: [place] CV [facility];  Service: Cardiovascular;  Laterality: N/A;  . CESAREAN SECTION  1989  . COLONOSCOPY    . CORONARY ANGIOPLASTY  10/03/2013   95% ostial AV G Cx - 2.5 mm AngioSculpt Balloon PTCA; mid LAD 40-60%  . LEFT HEART CATHETERIZATION WITH CORONARY ANGIOGRAM N/A  10/02/2013   Procedure: LEFT HEART CATHETERIZATION WITH CORONARY ANGIOGRAM;  Surgeon: [name], MD;  Location: [facility] CATH LAB;  Service: Cardiovascular;  Laterality: N/A;  . NASAL SEPTOPLASTY W/ TURBINOPLASTY  ~ 2007  . NM MYOVIEW LTD  08/22/2014    Low risk stress nuclear study with a small, severe, fixed defect in the distal inferior wall/apex suggestive of small prior infarct; no ischemia.  LV Wall Motion: NL LV Function; NL Wall Motion  . PERCUTANEOUS CORONARY STENT INTERVENTION (PCI-S) N/A 10/03/2013   cutting balloon angioplasty only no stent.   . TRANSTHORACIC ECHOCARDIOGRAM  10/02/2013   EF 55-60%; mild LVH, no RWMA,   . TUBAL LIGATION  1989    There were no vitals filed for this visit.   Subjective Assessment - 01/17/20 0855    Subjective Pt arriving to therapy reporting compliance with her HEP. Pt reporting her left knee is giving her more problems than her shoulder right now.    Pertinent History PMH: CAD,bronchitis,HTNN,NSTEMI    Limitations Lifting;House hold activities    Diagnostic tests MRI that revealed a "partial thickness tear of the supraspinatus, and frozen shoulder"    Patient Stated Goals get arm back to normal    Currently in Pain? No/denies  [facility] Adult PT Treatment/Exercise - 01/17/20 0001      Exercises   Exercises Shoulder      Shoulder Exercises: Standing   External Rotation AROM;Strengthening;Right;15 reps;Theraband    Theraband Level (Shoulder External Rotation) Level 2 (Red)    Internal Rotation AROM;Strengthening;Right;15 reps;Theraband    Theraband Level (Shoulder Internal Rotation) Level 2 (Red)    Row 15 reps;Theraband    Theraband Level (Shoulder Row) Level 2 (Red)    Other Standing Exercises wall ladder x 5, ball rolling up and down and circles in bilateral directions x 20    Other Standing Exercises wall walks using yellow theraband around both hands      Shoulder Exercises: Pulleys   Flexion 2  minutes    Scaption 2 minutes      Shoulder Exercises: ROM/Strengthening   UBE (Upper Arm Bike) 5 minutes (2.5 minutes forward and back)      Shoulder Exercises: Stretch   Other Shoulder Stretches towel stretch for IR in standing,       Manual Therapy   Manual Therapy Joint mobilization;Passive ROM    Manual therapy comments 10 minutes    Joint Mobilization Grade 3-4 GH AP mobs R shoulder    Passive ROM flexion/ abduction/ ER/ IR                       PT Long Term Goals - 01/17/20 0930      PT LONG TERM GOAL #1   Title Pt will be I and compliant with HEP.    Status On-going      PT LONG TERM GOAL #2   Title Pt will improve Rt shoulder strength to 5/5    Status On-going      PT LONG TERM GOAL #3   Title Pt will improve Rt shoulder ROM to WNL    Status On-going                 Plan - 01/17/20 4967    Clinical Impression Statement Pt arriving reporting no pain in R shoulder. Pt still reporting stiffness at times. Pt compliant with HEP. Pt tolerating all progressive exercises well. Pt reporting less stiffness following GH mobs and PROM. Continue skilled PT for 2 more visits with continued HEP updates as needed.    Personal Factors and Comorbidities Comorbidity 3+    Comorbidities PMH: CAD,bronchitis,HTNN,NSTEMI    Examination-Activity Limitations Carry;Lift;Reach Overhead    Examination-Participation Restrictions Driving;Laundry;Yard Work    Stability/Clinical Decision Making Stable/Uncomplicated    PT Frequency 1x / week    PT Duration 4 weeks    PT Treatment/Interventions ADLs/Self Care Home Management;Cryotherapy;Electrical Stimulation;Iontophoresis 68m/ml Dexamethasone;Moist Heat;Ultrasound;Therapeutic exercise;Neuromuscular re-education;Manual techniques;Passive range of motion;Dry needling;Joint Manipulations;Taping;Vasopneumatic Device    PT Next Visit Plan may be one visit only but if she returns reivew and updated HEP PRN, GH mobs and RTC strength.     PT Home Exercise Plan see pt instructions    Consulted and Agree with Plan of Care Patient          PHYSICAL THERAPY DISCHARGE SUMMARY  Visits from Start of Care: 2 of 4 visits attended  Current functional level related to goals / functional outcomes: See above.    Remaining deficits: See above   Education / Equipment: HEP Plan: Patient agrees to discharge.  Patient goals were not met. Patient is being discharged due to not returning since the last visit.  ?????      Patient will benefit from skilled  therapeutic intervention in order to improve the following deficits and impairments:  Decreased mobility, Decreased range of motion, Decreased strength, Increased muscle spasms, Pain, Postural dysfunction  Visit Diagnosis: Stiffness of right shoulder joint  Acute pain of right shoulder  Muscle weakness (generalized)     Problem List Patient Active Problem List   Diagnosis Date Noted  . Acute midline low back pain 01/09/2020  . COVID-19 virus infection 06/28/2019  . Ear pain, left 03/30/2019  . Nausea 02/08/2018  . Acute pain of left knee 08/28/2016  . OSA (obstructive sleep apnea) 06/10/2016  . Costochondritis 06/09/2016  . Prediabetes 05/25/2016  . Bilateral leg cramps 02/07/2016  . Peripheral edema 01/29/2016  . Crescendo angina ([place]) 08/05/2015  . Essential hypertension   . GERD (gastroesophageal reflux disease) 08/10/2014  . Menopausal syndrome 08/10/2014  . Accelerated hypertension with heart disease and without congestive heart failure 10/03/2013  . CAD S/P percutaneous coronary angioplasty - Ostial AVG Cx - Scoring balloon PTCA 10/03/2013  . Hyperlipidemia with target LDL less than 70 10/02/2013  . H/O non-ST elevation myocardial infarction (NSTEMI) 10/02/2013  . Normocytic anemia, not due to blood loss   . Allergic rhinitis 03/17/2007  . MIGRAINE, COMMON W/O INTRACTABLE MIGRAINE 03/16/2007    Oretha Caprice, PT, MPT 01/17/2020, 9:30 AM  [facility] [address] [place], [name], [address] Phone: [phone]   Fax:  [phone]  Name: Lyrah Bradt MRN: [id number] Date of Birth: 1961-03-02

## 2020-01-17 NOTE — Telephone Encounter (Signed)
Pt left v/m requesting message to [facility]; pt wanting to request Dr [name] to do referral for pt to [facility]) cardiologist on [facility] in [place]; Pt does not have preference of which [facility] cardiologist she sees; whichever card Dr [name] prefers. Pt just thinks it is time to see cardiologist.Pt last saw Dr Maylon Peppers cardiologist with [facility]. pt has not seen [facility] card in over 3 yrs. Pt last seen acute for leg pain on 01/09/20 and 05/29/2019 for annual. Pt request cb when referral has been done.

## 2020-01-18 NOTE — Telephone Encounter (Signed)
I agree that she needs to be followed regularly by cardiology.  I have placed referral. Let pt know.

## 2020-01-18 NOTE — Telephone Encounter (Signed)
Ms. Christine Barrett notified as instructed by telephone.

## 2020-01-23 ENCOUNTER — Encounter: Payer: Self-pay | Admitting: Family Medicine

## 2020-01-23 ENCOUNTER — Other Ambulatory Visit: Payer: Self-pay

## 2020-01-23 ENCOUNTER — Ambulatory Visit: Payer: 59 | Admitting: Family Medicine

## 2020-01-23 DIAGNOSIS — I2510 Atherosclerotic heart disease of native coronary artery without angina pectoris: Secondary | ICD-10-CM

## 2020-01-23 DIAGNOSIS — M25552 Pain in left hip: Secondary | ICD-10-CM | POA: Insufficient documentation

## 2020-01-23 DIAGNOSIS — M25562 Pain in left knee: Secondary | ICD-10-CM | POA: Diagnosis not present

## 2020-01-23 DIAGNOSIS — Z9861 Coronary angioplasty status: Secondary | ICD-10-CM

## 2020-01-23 DIAGNOSIS — R609 Edema, unspecified: Secondary | ICD-10-CM

## 2020-01-23 DIAGNOSIS — M545 Low back pain, unspecified: Secondary | ICD-10-CM

## 2020-01-23 DIAGNOSIS — R252 Cramp and spasm: Secondary | ICD-10-CM

## 2020-01-23 NOTE — Assessment & Plan Note (Signed)
Improved off amlodipine and with HCTZ. BP remins well controlled.

## 2020-01-23 NOTE — Assessment & Plan Note (Signed)
Normal CK, normal BMET, Mg, no DVT ? Related to low back issue, ? Caused by spinal stenosis.

## 2020-01-23 NOTE — Assessment & Plan Note (Signed)
?   Spinal stenosis.  offered X-ray low back and pred taper... she will consult with ortho first.

## 2020-01-23 NOTE — Progress Notes (Signed)
Chief Complaint  Patient presents with  . Follow-up    Swelling/Muscle Spasms    History of Present Illness: HPI   59 year old female with CAD presents for follow up on bilateral leg swelling and l;eg spasms.   At last OV.. normal CK, tsh, cbc, bnp, bmet and mg  [place]: negative for DVT  X-ray left knee: mild OA   Amlodipine was sopped and she was changed to HCTZ.  BP Readings from Last 3 Encounters:  01/23/20 (!) 130/80  01/09/20 124/76  12/13/19 (!) 150/80   Wt Readings from Last 3 Encounters:  01/23/20 (!) 288 lb (130.6 kg)  01/09/20 288 lb 4 oz (130.7 kg)  12/13/19 285 lb 12 oz (129.6 kg)     She has been referred to cardiology for re-eval of her heart issue.  She reports today:  She has noted significant decreased leg swelling bilaterally.   She is still having pain in bilateral legs. Upper and lower. Left greater tahn right.  Feeling crampy  and like legs spasming. Sometimes so bad she cannot walk for 30 min. Stretching hurt to much to do.  No clear trigger... sometimes at night or in daytime., rest or when doing things.   She cannot stand for longer than 15 min... causes low back pain.. has to sit, followed by heaviness in legs, pulling feeling in legs.  Also left hip pain with walking x months. No Fall. Pain is over lateral hip, no buttock pain.  She had no benefit in left hip pain in June when she was on this for 10 days for shoulder.  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   She is scheduled to see  Dr. [name] for shoulder on 02/02/2020.    This visit occurred during the SARS-CoV-2 public health emergency.  Safety protocols were in place, including screening questions prior to the visit, additional usage of staff PPE, and extensive cleaning of exam room while observing appropriate contact time as indicated for disinfecting solutions.   COVID 19 screen:  No recent travel or known exposure to COVID19 The patient denies respiratory symptoms of COVID  19 at this time. The importance of social distancing was discussed today.     ROS    Past Medical History:  Diagnosis Date  . Allergy    SEASONAL  . Anemia   . CAD S/P percutaneous coronary angioplasty 09/2013   a) Ostial AV G Cx - 2.5 mm Angiosculpt; mid LAD 40-60%; b) Myoview 07/2014: LOW RISK, small-severe fixed inferior defect c/w infarct w/o peri-infarct ischemia.  . Chronic bronchitis ([place])    "frequently; not q yr" (10/03/2013)  . Diverticulosis   . Essential hypertension    with prior Accelerated HTN  . GERD (gastroesophageal reflux disease)   . Hiatal hernia   . Hx of non-ST elevation myocardial infarction (NSTEMI) 10/01/2013   Due to Accelerated HTN with existing CAD  . Hyperlipemia   . Migraine    "@ least once/month" (10/03/2013)  . OSA (obstructive sleep apnea) 06/10/2016  . Schatzki's ring   . Seasonal allergies   . Sinusitis     reports that she has never smoked. She has never used smokeless tobacco. She reports current alcohol use. She reports that she does not use drugs.   Current Outpatient Medications:  .  albuterol (PROVENTIL HFA;VENTOLIN HFA) 108 (90 Base) MCG/ACT inhaler, INHALE 2 PUFFS BY MOUTH EVERY 6 HOURS AS NEEDED FOR WHEEZING OR  SHORTNESS  OF  BREATH, Disp: 18 each, Rfl: 0 .  aspirin EC 81 MG EC tablet, Take 1 tablet (81 mg total) by mouth daily., Disp: , Rfl:  .  atorvastatin (LIPITOR) 80 MG tablet, Take 1 tablet (80 mg total) by mouth daily., Disp: 90 tablet, Rfl: 3 .  carvedilol (COREG) 25 MG tablet, Take 1 tablet (25 mg total) by mouth 2 (two) times daily with a meal., Disp: 180 tablet, Rfl: 3 .  diazepam (VALIUM) 2 MG tablet, 1 tab 45 minutes before MRI, may repeat if needed, Disp: 2 tablet, Rfl: 0 .  ezetimibe (ZETIA) 10 MG tablet, Take 1 tablet (10 mg total) by mouth daily., Disp: 30 tablet, Rfl: 11 .  famotidine (PEPCID) 20 MG tablet, Take 20 mg by mouth 2 (two) times daily., Disp: , Rfl:  .  fluticasone (FLONASE) 50 MCG/ACT nasal spray, Place 2  sprays into both nostrils daily., Disp: 16 g, Rfl: 0 .  hydrochlorothiazide (HYDRODIURIL) 25 MG tablet, Take 1 tablet (25 mg total) by mouth daily., Disp: 90 tablet, Rfl: 0 .  ibuprofen (ADVIL) 800 MG tablet, Take 1 tablet (800 mg total) by mouth every 8 (eight) hours as needed., Disp: 30 tablet, Rfl: 0 .  isosorbide mononitrate (IMDUR) 60 MG 24 hr tablet, Take 2 tablets (120 mg total) by mouth daily., Disp: 180 tablet, Rfl: 3 .  losartan (COZAAR) 100 MG tablet, Take 1 tablet (100 mg total) by mouth daily., Disp: 90 tablet, Rfl: 3 .  montelukast (SINGULAIR) 10 MG tablet, TAKE 1 TABLET BY MOUTH AT BEDTIME, Disp: 90 tablet, Rfl: 3 .  Multiple Vitamin (MULTIVITAMIN WITH MINERALS) TABS tablet, Take 1 tablet by mouth daily., Disp: , Rfl:  .  Multiple Vitamins-Minerals (EMERGEN-C IMMUNE) PACK, Take 1 packet by mouth daily., Disp: , Rfl:  .  pantoprazole (PROTONIX) 40 MG tablet, Take 1 tablet by mouth once daily, Disp: 90 tablet, Rfl: 3   Observations/Objective: Blood pressure (!) 130/80, pulse 70, temperature (!) 96.8 F (36 C), temperature source Temporal, height 5\' 9"  (1.753 m), weight (!) 288 lb (130.6 kg), SpO2 96 %.  Physical Exam Constitutional:      General: She is not in acute distress.    Appearance: Normal appearance. She is well-developed. She is morbidly obese. She is not ill-appearing or toxic-appearing.  HENT:     Head: Normocephalic.     Right Ear: Hearing, tympanic membrane, ear canal and external ear normal. Tympanic membrane is not erythematous, retracted or bulging.     Left Ear: Hearing, tympanic membrane, ear canal and external ear normal. Tympanic membrane is not erythematous, retracted or bulging.     Nose: No mucosal edema or rhinorrhea.     Right Sinus: No maxillary sinus tenderness or frontal sinus tenderness.     Left Sinus: No maxillary sinus tenderness or frontal sinus tenderness.     Mouth/Throat:     Pharynx: Uvula midline.  Eyes:     General: Lids are normal.  Lids are everted, no foreign bodies appreciated.     Conjunctiva/sclera: Conjunctivae normal.     Pupils: Pupils are equal, round, and reactive to light.  Neck:     Thyroid: No thyroid mass or thyromegaly.     Vascular: No carotid bruit.     Trachea: Trachea normal.  Cardiovascular:     Rate and Rhythm: Normal rate and regular rhythm.     Pulses: Normal pulses.     Heart sounds: Normal heart sounds, S1 normal and S2 normal. No murmur heard.  No friction rub. No gallop.   Pulmonary:     Effort: Pulmonary effort is normal. No tachypnea or respiratory distress.     Breath sounds: Normal breath sounds. No decreased breath sounds, wheezing, rhonchi or rales.  Abdominal:     General: Bowel sounds are normal.     Palpations: Abdomen is soft.     Tenderness: There is no abdominal tenderness.  Musculoskeletal:     Cervical back: Normal range of motion and neck supple.     Lumbar back: Tenderness present. No bony tenderness. Normal range of motion. Negative right straight leg raise test and negative left straight leg raise test.     Left knee: No swelling, effusion or erythema. Decreased range of motion. Tenderness present over the lateral joint line.     Right ankle: Tenderness present. Decreased range of motion.     Left ankle: Tenderness present. Decreased range of motion.     Comments: ttp central low back and in left lateral hip over trochanteric bursa.  Skin:    General: Skin is warm and dry.     Findings: No rash.  Neurological:     Mental Status: She is alert.     Comments: tipas of toes bialterally numb to palpation and light touch  Psychiatric:        Mood and Affect: Mood is not anxious or depressed.        Speech: Speech normal.        Behavior: Behavior normal. Behavior is cooperative.        Thought Content: Thought content normal.        Judgment: Judgment normal.      Assessment and Plan Peripheral edema Improved off amlodipine and with HCTZ. BP remins well  controlled.   CAD S/P percutaneous coronary angioplasty - Ostial AVG Cx - Scoring balloon PTCA Referred to cardiology given she has not had re-eval in 3 years.  Acute midline low back pain ? Spinal stenosis.  offered X-ray low back and pred taper... she will consult with ortho first.  Acute pain of left knee Mild OA not clearly cause of leg pain.  Bilateral leg cramps Normal CK, normal BMET, Mg, no DVT ? Related to low back issue, ? Caused by spinal stenosis.    Left hip pain TTP over trochanteric bursa.Pain was not better when she was on prednisone for shoulder. Will refer to [facility] for eval and likely steroid injection... she will call as she already sees Dr. [name] for her shoulder.      Eliezer Lofts, MD

## 2020-01-23 NOTE — Assessment & Plan Note (Signed)
Mild OA not clearly cause of leg pain.

## 2020-01-23 NOTE — Assessment & Plan Note (Signed)
Referred to cardiology given she has not had re-eval in 3 years.

## 2020-01-23 NOTE — Patient Instructions (Signed)
Discuss with Dr. [name] of other Ortho..  Left hip pain.. ? Possible trochanteric bursitis  Bilateral leg pain, low back pain with standing > 15 min.. possible spinal stenosis symptoms?  Continue HCTZ and remain off amlodipine. Keep cardiology referral as planned.

## 2020-01-23 NOTE — Assessment & Plan Note (Signed)
TTP over trochanteric bursa.Pain was not better when she was on prednisone for shoulder. Will refer to [facility] for eval and likely steroid injection... she will call as she already sees Dr. [name] for her shoulder.

## 2020-01-26 ENCOUNTER — Telehealth: Payer: Self-pay | Admitting: Physical Therapy

## 2020-01-26 ENCOUNTER — Encounter: Payer: 59 | Admitting: Physical Therapy

## 2020-01-26 NOTE — Telephone Encounter (Signed)
Pt no show for PT appointment today. They were contacted and informed of this via voicemail. They were provided the date and time of their next appointment on voicemail. They were instructed to call us to let us know if they cannot make their appointment or if they want [place] to cancel it. PT had previously discussed she may not need any more visits however she still had today's visit and one other visit scheduled if she felt like the did want to return.  Elsie Ra, PT, DPT 01/26/20 9:33 AM

## 2020-01-29 ENCOUNTER — Encounter: Payer: 59 | Admitting: Physical Therapy

## 2020-01-29 ENCOUNTER — Telehealth: Payer: Self-pay | Admitting: Physical Therapy

## 2020-01-29 NOTE — Telephone Encounter (Signed)
Called pt to follow up on her missed visit this morning. Pt reported she had planned to cancel on Friday afternoon but got caught up at another appointment and we were closed before she got out. I spoke with her and told her to call during regular business hours if she needed any further PT appointments since no more were scheduled.  Kearney Hard, PT, MPT 01/29/20 5:11 PM

## 2020-02-02 ENCOUNTER — Ambulatory Visit (INDEPENDENT_AMBULATORY_CARE_PROVIDER_SITE_OTHER): Payer: 59

## 2020-02-02 ENCOUNTER — Encounter: Payer: Self-pay | Admitting: Orthopedic Surgery

## 2020-02-02 ENCOUNTER — Ambulatory Visit (INDEPENDENT_AMBULATORY_CARE_PROVIDER_SITE_OTHER): Payer: 59 | Admitting: Orthopedic Surgery

## 2020-02-02 DIAGNOSIS — M25552 Pain in left hip: Secondary | ICD-10-CM

## 2020-02-02 DIAGNOSIS — M4807 Spinal stenosis, lumbosacral region: Secondary | ICD-10-CM

## 2020-02-02 NOTE — Progress Notes (Signed)
Office Visit Note   Patient: Christine Barrett           Date of Birth: [birth year]           MRN: [id number] Visit Date: 02/02/2020 Requested by: Jinny Sanders, MD [place],  [place] [address] PCP: Jinny Sanders, MD  Subjective: Chief Complaint  Patient presents with  . Right Shoulder - Follow-up  . Left Hip - Pain    HPI: Christine Barrett presents for follow-up of right shoulder.  States that the shoulder is doing well.  She was seen in June for that and did well with therapy and injection initiated at that time.  She also describes low back pain.  She uses a cane to walk.  Describes lateral sided hip pain.  Spasms occur in both legs.  Cramping which is not necessarily activity related is present.  It affects both legs.  Denies much in the way of numbness or tingling in the legs.  This has been going on for 3 months and she has failed a course of nonoperative treatment including home exercise program and attempt at a walking program as well as over-the-counter medication.              ROS: All systems reviewed are negative as they relate to the chief complaint within the history of present illness.  Patient denies  fevers or chills.   Assessment & Plan: Visit Diagnoses:  1. Pain in left hip   2. Spinal stenosis of lumbosacral region     Plan: Impression is improvement in right shoulder following intervention in June.  She also has some low back pain and cramping.  Hard to say if the cramping is metabolic or potentially related to spinal stenosis.  Lumbar spine x-rays unremarkable.  Plan is MRI lumbar spine to evaluate for possible stenosis with possible ESI's to follow.  If that is negative then we may have to consider other sources of her leg cramping such as metabolic sources.  I will see her back after her study.  Follow-Up Instructions: No follow-ups on file.   Orders:  Orders Placed This Encounter  Procedures  . XR HIP UNILAT W OR W/O PELVIS 2-3 VIEWS LEFT  . XR Lumbar  Spine 2-3 Views  . MR Lumbar Spine w/o contrast   No orders of the defined types were placed in this encounter.     Procedures: No procedures performed   Clinical Data: No additional findings.  Objective: Vital Signs: There were no vitals taken for this visit.  Physical Exam:   Constitutional: Patient appears well-developed HEENT:  Head: Normocephalic Eyes:EOM are normal Neck: Normal range of motion Cardiovascular: Normal rate Pulmonary/chest: Effort normal Neurologic: Patient is alert Skin: Skin is warm Psychiatric: Patient has normal mood and affect    Ortho Exam: Ortho exam demonstrates no groin pain with internal extra rotation of the leg.  No definite paresthesias L1 S1 bilaterally.  Reflex symmetric bilateral patella and Achilles 0 1+ out of 4.  Fetal pulses palpable.  Ankle dorsiflexion plantarflexion quad hamstring strength hip flexion strength 5+ out of 5 bilaterally.  Some pain with forward lateral bending.  Shoulder exam demonstrates full active and passive range of motion with negative impingement signs.  Specialty Comments:  No specialty comments available.  Imaging: XR HIP UNILAT W OR W/O PELVIS 2-3 VIEWS LEFT  Result Date: 02/02/2020 AP pelvis lateral left hip reviewed.  Soft tissue density obscures some bony detail.  Mild degenerative changes  noted in both hips without bone-on-bone arthritis.  No acute fracture.  Lumbar facet arthritis is present in the lower lumbar spine region.    PMFS History: Patient Active Problem List   Diagnosis Date Noted  . Left hip pain 01/23/2020  . Acute midline low back pain 01/09/2020  . COVID-19 virus infection 06/28/2019  . Ear pain, left 03/30/2019  . Nausea 02/08/2018  . Acute pain of left knee 08/28/2016  . OSA (obstructive sleep apnea) 06/10/2016  . Costochondritis 06/09/2016  . Prediabetes 05/25/2016  . Bilateral leg cramps 02/07/2016  . Peripheral edema 01/29/2016  . Crescendo angina ([place]) 08/05/2015  .  Essential hypertension   . GERD (gastroesophageal reflux disease) 08/10/2014  . Menopausal syndrome 08/10/2014  . Accelerated hypertension with heart disease and without congestive heart failure 10/03/2013  . CAD S/P percutaneous coronary angioplasty - Ostial AVG Cx - Scoring balloon PTCA 10/03/2013  . Hyperlipidemia with target LDL less than 70 10/02/2013  . H/O non-ST elevation myocardial infarction (NSTEMI) 10/02/2013  . Normocytic anemia, not due to blood loss   . Allergic rhinitis 03/17/2007  . MIGRAINE, COMMON W/O INTRACTABLE MIGRAINE 03/16/2007   Past Medical History:  Diagnosis Date  . Allergy    SEASONAL  . Anemia   . CAD S/P percutaneous coronary angioplasty 09/2013   a) Ostial AV G Cx - 2.5 mm Angiosculpt; mid LAD 40-60%; b) Myoview 07/2014: LOW RISK, small-severe fixed inferior defect c/w infarct w/o peri-infarct ischemia.  . Chronic bronchitis ([place])    "frequently; not q yr" (10/03/2013)  . Diverticulosis   . Essential hypertension    with prior Accelerated HTN  . GERD (gastroesophageal reflux disease)   . Hiatal hernia   . Hx of non-ST elevation myocardial infarction (NSTEMI) 10/01/2013   Due to Accelerated HTN with existing CAD  . Hyperlipemia   . Migraine    "@ least once/month" (10/03/2013)  . OSA (obstructive sleep apnea) 06/10/2016  . Schatzki's ring   . Seasonal allergies   . Sinusitis     Family History  Adopted: Yes  Problem Relation Age of Onset  . Diabetes Mother   . Breast cancer Sister   . Hypertension Sister   . Colon cancer Neg Hx   . Heart attack Neg Hx   . Stroke Neg Hx     Past Surgical History:  Procedure Laterality Date  . ABDOMINAL HYSTERECTOMY  1994   "partial"  . CARDIAC CATHETERIZATION N/A 08/05/2015   Procedure: Left Heart Cath and Coronary Angiography;  Surgeon: [name], MD;  Location: [facility];  Service: Cardiovascular;  Laterality: N/A;  . CESAREAN SECTION  1989  . COLONOSCOPY    . CORONARY ANGIOPLASTY  10/03/2013    95% ostial AV G Cx - 2.5 mm AngioSculpt Balloon PTCA; mid LAD 40-60%  . LEFT HEART CATHETERIZATION WITH CORONARY ANGIOGRAM N/A 10/02/2013   Procedure: LEFT HEART CATHETERIZATION WITH CORONARY ANGIOGRAM;  Surgeon: [name], MD;  [facility];  Service: Cardiovascular;  Laterality: N/A;  . NASAL SEPTOPLASTY W/ TURBINOPLASTY  ~ 2007  . NM MYOVIEW LTD  08/22/2014    Low risk stress nuclear study with a small, severe, fixed defect in the distal inferior wall/apex suggestive of small prior infarct; no ischemia.  LV Wall Motion: NL LV Function; NL Wall Motion  . PERCUTANEOUS CORONARY STENT INTERVENTION (PCI-S) N/A 10/03/2013   cutting balloon angioplasty only no stent.   . TRANSTHORACIC ECHOCARDIOGRAM  10/02/2013   EF 55-60%; mild LVH, no  RWMA,   . TUBAL LIGATION  1989   Social History   Occupational History  . Occupation: Research scientist (life sciences)  Tobacco Use  . Smoking status: Never Smoker  . Smokeless tobacco: Never Used  Vaping Use  . Vaping Use: Never used  Substance and Sexual Activity  . Alcohol use: Yes    Alcohol/week: 0.0 standard drinks    Comment: occ  . Drug use: No  . Sexual activity: Yes    Birth control/protection: Surgical

## 2020-02-07 ENCOUNTER — Telehealth: Payer: Self-pay | Admitting: Orthopedic Surgery

## 2020-02-07 NOTE — Telephone Encounter (Signed)
Called patient left message on voicemail to call back and schedule an appointment with Dr dean for MRI review   MRI scheduled for 02/27/2020

## 2020-02-12 ENCOUNTER — Ambulatory Visit (INDEPENDENT_AMBULATORY_CARE_PROVIDER_SITE_OTHER): Payer: 59 | Admitting: Cardiology

## 2020-02-12 ENCOUNTER — Other Ambulatory Visit: Payer: Self-pay

## 2020-02-12 ENCOUNTER — Encounter: Payer: Self-pay | Admitting: Cardiology

## 2020-02-12 VITALS — BP 128/76 | HR 82 | Temp 97.1°F | Ht 69.0 in | Wt 295.0 lb

## 2020-02-12 DIAGNOSIS — I2510 Atherosclerotic heart disease of native coronary artery without angina pectoris: Secondary | ICD-10-CM | POA: Diagnosis not present

## 2020-02-12 DIAGNOSIS — Z9861 Coronary angioplasty status: Secondary | ICD-10-CM

## 2020-02-12 DIAGNOSIS — Z7189 Other specified counseling: Secondary | ICD-10-CM

## 2020-02-12 DIAGNOSIS — E7800 Pure hypercholesterolemia, unspecified: Secondary | ICD-10-CM | POA: Diagnosis not present

## 2020-02-12 DIAGNOSIS — M940 Chondrocostal junction syndrome [Tietze]: Secondary | ICD-10-CM

## 2020-02-12 DIAGNOSIS — I10 Essential (primary) hypertension: Secondary | ICD-10-CM | POA: Diagnosis not present

## 2020-02-12 NOTE — Progress Notes (Signed)
Cardiology Office Note:    Date:  02/12/2020   ID:  Christine Barrett, DOB 09-11-1960, MRN [id number]  PCP:  Jinny Sanders, MD  Cardiologist:  Buford Dresser, MD  Referring MD: Jinny Sanders, MD   Chief Complaint  Patient presents with  . Follow-up    History of Present Illness:    Christine Barrett is a 59 y.o. female with a hx of CAD s/p prior PTCA, hypertension, OSA, hyperlipidemia who is seen as a new consult at the request of [name], [name], MD for the evaluation and management of hypertension and coronary artery disease. She has not seen a cardiologist here since 2017.   Note from Dr. [name] from 01/23/20 reviewed. Reported bilateral leg swelling at that visit. Had DVT study 7.19.21 which was negative.  Concerns today: Feels like her heart is ok, but has questions re: costochondroitis. She has frequent chest pain, sometimes severe, but she has been to the ER multiple times and has been told it is not her heart. More associated with fatigue, but also comes sporadically. Nothing seems to help. Tramadol, muscle relaxers do not touch her pain.  Leg swelling has improved since starting HCTZ. Has pictures, noted bilateral ankle edema in the pictures. She has been on amlodipine for a long time, but noted swelling after her fall in 10/2019. Has long history of leg cramps bilaterally, come on with sitting still and improves with walking it out. Cramps can go all the way up her legs. Also has pain in her lower back with standing. Has had x-rays, has used cane to help with walking. Has MRI pending. Following with Dr. [name] for this.   Has worked on diet changes, wants to lose weight.   Chest pain: throbbing, sharp pain, sore to touch over sternum. Worse with taking a deep breath. Can come out of nowhere, or she can feel it building up. Worse with extreme exhaustion, sinus infection. Worse with heavy lifting.   Had Covid 05/2019, felt bad for over a month, still not fully recovered.   Past  Medical History:  Diagnosis Date  . Allergy    SEASONAL  . Anemia   . CAD S/P percutaneous coronary angioplasty 09/2013   a) Ostial AV G Cx - 2.5 mm Angiosculpt; mid LAD 40-60%; b) Myoview 07/2014: LOW RISK, small-severe fixed inferior defect c/w infarct w/o peri-infarct ischemia.  . Chronic bronchitis ([place])    "frequently; not q yr" (10/03/2013)  . Diverticulosis   . Essential hypertension    with prior Accelerated HTN  . GERD (gastroesophageal reflux disease)   . Hiatal hernia   . Hx of non-ST elevation myocardial infarction (NSTEMI) 10/01/2013   Due to Accelerated HTN with existing CAD  . Hyperlipemia   . Migraine    "@ least once/month" (10/03/2013)  . OSA (obstructive sleep apnea) 06/10/2016  . Schatzki's ring   . Seasonal allergies   . Sinusitis     Past Surgical History:  Procedure Laterality Date  . ABDOMINAL HYSTERECTOMY  1994   "partial"  . CARDIAC CATHETERIZATION N/A 08/05/2015   Procedure: Left Heart Cath and Coronary Angiography;  Surgeon: [name], MD;  Location: [facility];  Service: Cardiovascular;  Laterality: N/A;  . CESAREAN SECTION  1989  . COLONOSCOPY    . CORONARY ANGIOPLASTY  10/03/2013   95% ostial AV G Cx - 2.5 mm AngioSculpt Balloon PTCA; mid LAD 40-60%  . LEFT HEART CATHETERIZATION WITH CORONARY ANGIOGRAM N/A 10/02/2013   Procedure: LEFT HEART  CATHETERIZATION WITH CORONARY ANGIOGRAM;  Surgeon: [name], MD;  Location: [facility] CATH LAB;  Service: Cardiovascular;  Laterality: N/A;  . NASAL SEPTOPLASTY W/ TURBINOPLASTY  ~ 2007  . NM MYOVIEW LTD  08/22/2014    Low risk stress nuclear study with a small, severe, fixed defect in the distal inferior wall/apex suggestive of small prior infarct; no ischemia.  LV Wall Motion: NL LV Function; NL Wall Motion  . PERCUTANEOUS CORONARY STENT INTERVENTION (PCI-S) N/A 10/03/2013   cutting balloon angioplasty only no stent.   . TRANSTHORACIC ECHOCARDIOGRAM  10/02/2013   EF 55-60%; mild LVH, no RWMA,   . TUBAL  LIGATION  1989    Current Medications: Current Outpatient Medications on File Prior to Visit  Medication Sig  . albuterol (PROVENTIL HFA;VENTOLIN HFA) 108 (90 Base) MCG/ACT inhaler INHALE 2 PUFFS BY MOUTH EVERY 6 HOURS AS NEEDED FOR WHEEZING OR  SHORTNESS  OF  BREATH  . aspirin EC 81 MG EC tablet Take 1 tablet (81 mg total) by mouth daily.  [name] atorvastatin (LIPITOR) 80 MG tablet Take 1 tablet (80 mg total) by mouth daily.  . carvedilol (COREG) 25 MG tablet Take 1 tablet (25 mg total) by mouth 2 (two) times daily with a meal.  . ezetimibe (ZETIA) 10 MG tablet Take 1 tablet (10 mg total) by mouth daily.  . famotidine (PEPCID) 20 MG tablet Take 20 mg by mouth 2 (two) times daily.  . fluticasone (FLONASE) 50 MCG/ACT nasal spray Place 2 sprays into both nostrils daily.  . hydrochlorothiazide (HYDRODIURIL) 25 MG tablet Take 1 tablet (25 mg total) by mouth daily.  [name] ibuprofen (ADVIL) 800 MG tablet Take 1 tablet (800 mg total) by mouth every 8 (eight) hours as needed.  . isosorbide mononitrate (IMDUR) 60 MG 24 hr tablet Take 2 tablets (120 mg total) by mouth daily.  [name] losartan (COZAAR) 100 MG tablet Take 1 tablet (100 mg total) by mouth daily.  . montelukast (SINGULAIR) 10 MG tablet TAKE 1 TABLET BY MOUTH AT BEDTIME  . Multiple Vitamin (MULTIVITAMIN WITH MINERALS) TABS tablet Take 1 tablet by mouth daily.  . Multiple Vitamins-Minerals (EMERGEN-C IMMUNE) PACK Take 1 packet by mouth daily.  . pantoprazole (PROTONIX) 40 MG tablet Take 1 tablet by mouth once daily   No current facility-administered medications on file prior to visit.     Allergies:   Penicillins   Social History   Tobacco Use  . Smoking status: Never Smoker  . Smokeless tobacco: Never Used  Vaping Use  . Vaping Use: Never used  Substance Use Topics  . Alcohol use: Yes    Alcohol/week: 0.0 standard drinks    Comment: occ  . Drug use: No    Family History: family history includes Breast cancer in her sister; Diabetes in  her mother; Hypertension in her sister. There is no history of Colon cancer, Heart attack, or Stroke. She was adopted.  ROS:   Please see the history of present illness.  Additional pertinent ROS: Constitutional: Negative for chills, fever, night sweats, unintentional weight loss  HENT: Negative for ear pain and hearing loss.   Eyes: Negative for loss of vision and eye pain.  Respiratory: Negative for cough, sputum, wheezing.   Cardiovascular: See HPI. Gastrointestinal: Negative for abdominal pain, melena, and hematochezia.  Genitourinary: Negative for dysuria and hematuria.  Musculoskeletal: Negative for falls and myalgias.  Skin: Negative for itching and rash.  Neurological: Negative for focal weakness, focal sensory changes and loss of consciousness.  Endo/Heme/Allergies:  Does not bruise/bleed easily.     EKGs/Labs/Other Studies Reviewed:    The following studies were reviewed today: LHC 12-Aug-2015  Ost 2nd Diag to 2nd Diag lesion, 80% stenosed. 2 small for PCI  Prox LAD to Mid LAD lesion, 60% stenosed.  Prox Cx to Mid Cx lesion, 50% stenosed. The lesion was previously treated with angioplasty one to two years ago.    Essentially stable coronary artery disease as compared to last catheterization. The PTCA site does have some restenosis but only 50% and a very focal segment.  Potential culprit for angiogram would be the diagonal lesion that is not amenable to PCI. If the patient were to have worsening symptoms, would recommend noninvasive stress testing to evaluate the LAD which appears to be stable from 2015. FFR and LV gram not performed due to concern for radial/brachial spasm.  Plan: 3  Standard post radial cath care with TR band removal   Discharge after bedrest and TR band removal  Increase Imdur dose to 120 mg  Consider adding diuretic   She will f/u with me or APP post cath for medication titration   EKG:  EKG is personally reviewed.  The ekg ordered today  demonstrates sinus rhythm at 82 bpm with nonspecific t wave pattern  Recent Labs: 01/09/2020: ALT 21; BUN 17; Creatinine, Ser 0.92; Hemoglobin 12.0; Magnesium 1.8; Platelets 230.0; Potassium 4.0; Pro B Natriuretic peptide (BNP) 81.0; Sodium 142; TSH 1.67  Recent Lipid Panel    Component Value Date/Time   CHOL 222 (H) 05/08/2019 0824   TRIG 105 05/08/2019 0824   HDL 49 (L) 05/08/2019 0824   CHOLHDL 4.5 05/08/2019 0824   VLDL 32.2 02/23/2017 0829   LDLCALC 151 (H) 05/08/2019 0824   LDLDIRECT 192.2 02/11/2009 1439    Physical Exam:    VS:  BP 128/76   Pulse 82   Temp (!) 97.1 F (36.2 C)   Ht 5\' 9"  (1.753 m)   Wt 295 lb (133.8 kg)   SpO2 97%   BMI 43.56 kg/m     Wt Readings from Last 3 Encounters:  02/12/20 295 lb (133.8 kg)  01/23/20 (!) 288 lb (130.6 kg)  01/09/20 288 lb 4 oz (130.7 kg)    GEN: Well nourished, well developed in no acute distress HEENT: Normal, moist mucous membranes NECK: No JVD CARDIAC: regular rhythm, normal S1 and S2, no rubs or gallops. No murmurs. Tender to palpation over sternum VASCULAR: Radial and DP pulses 2+ bilaterally. No carotid bruits RESPIRATORY:  Clear to auscultation without rales, wheezing or rhonchi  ABDOMEN: Soft, non-tender, non-distended MUSCULOSKELETAL:  Ambulates independently SKIN: Warm and dry, no edema NEUROLOGIC:  Alert and oriented x 3. No focal neuro deficits noted. PSYCHIATRIC:  Normal affect    ASSESSMENT:    1. Essential hypertension   2. CAD S/P percutaneous coronary angioplasty - Ostial AVG Cx - Scoring balloon PTCA   3. Costochondritis   4. Pure hypercholesterolemia   5. Cardiac risk counseling   6. Counseling on health promotion and disease prevention    PLAN:    CAD with prior PTCA Costochondritis -we discussed the difference between CAD and costochondritis today. Did discuss that typically the symptoms are different, but we reviewed red flag warning signs that need immediate medical attention. Has been to  the ER multiple times with a negative workup. -continue aspirin, statin, beta blocker, nitrate -avoid NSAIDs  Hypertension: at goal today -continue carvedilol, HCTZ, isosorbide, losartan  Hyperlipidemia: goal LDL <70, last LDL 151 per  [facility] -continue atorvastatin 80 mg, ezetimibe 10 mg daily -recheck at followup if not done prior  OSA: recommended use of CPAP  Cardiac risk counseling and prevention recommendations: -recommend heart healthy/Mediterranean diet, with whole grains, fruits, vegetable, fish, lean meats, nuts, and olive oil. Limit salt. -recommend moderate walking, 3-5 times/week for 30-50 minutes each session. Aim for at least 150 minutes.week. Goal should be pace of 3 miles/hours, or walking 1.5 miles in 30 minutes -recommend avoidance of tobacco products. Avoid excess alcohol. -ASCVD risk score: The 10-year ASCVD risk score Mikey Bussing DC Brooke Bonito., et al., 2013) is: 7.2%   Values used to calculate the score:     Age: 89 years     Sex: Female     Is Non-Hispanic African American: Yes     Diabetic: No     Tobacco smoker: No     Systolic Blood Pressure: 633 mmHg     Is BP treated: Yes     HDL Cholesterol: 49 mg/dL     Total Cholesterol: 222 mg/dL    Plan for follow up: 1 year or sooner as needed  Buford Dresser, MD, PhD [place]  [facility]    Medication Adjustments/Labs and Tests Ordered: Current medicines are reviewed at length with the patient today.  Concerns regarding medicines are outlined above.  Orders Placed This Encounter  Procedures  . EKG 12-Lead   No orders of the defined types were placed in this encounter.   Patient Instructions  Medication Instructions:  Your Physician recommend you continue on your current medication as directed.    *If you need a refill on your cardiac medications before your next appointment, please call your pharmacy*   Lab Work: None   Testing/Procedures: None   Follow-Up: At [facility], you and your  health needs are our priority.  As part of our continuing mission to provide you with exceptional heart care, we have created designated Provider Care Teams.  These Care Teams include your primary Cardiologist (physician) and Advanced Practice Providers (APPs -  Physician Assistants and Nurse Practitioners) who all work together to provide you with the care you need, when you need it.  We recommend signing up for the patient portal called "MyChart".  Sign up information is provided on this After Visit Summary.  MyChart is used to connect with patients for Virtual Visits (Telemedicine).  Patients are able to view lab/test results, encounter notes, upcoming appointments, etc.  Non-urgent messages can be sent to your provider as well.   To learn more about what you can do with MyChart, go to [email or web address].    Your next appointment:   1 year(s)  The format for your next appointment:   In Person  Provider:   Buford Dresser, MD      Signed, Buford Dresser, MD PhD 02/12/2020  [place]

## 2020-02-12 NOTE — Patient Instructions (Signed)

## 2020-02-27 ENCOUNTER — Ambulatory Visit
Admission: RE | Admit: 2020-02-27 | Discharge: 2020-02-27 | Disposition: A | Discharge status: Home / Self Care | Payer: 59 | Source: Ambulatory Visit | Attending: Orthopedic Surgery | Admitting: Orthopedic Surgery

## 2020-02-27 ENCOUNTER — Other Ambulatory Visit: Payer: Self-pay

## 2020-02-27 DIAGNOSIS — M4807 Spinal stenosis, lumbosacral region: Secondary | ICD-10-CM

## 2020-03-01 ENCOUNTER — Ambulatory Visit (INDEPENDENT_AMBULATORY_CARE_PROVIDER_SITE_OTHER): Payer: 59 | Admitting: Orthopedic Surgery

## 2020-03-01 ENCOUNTER — Encounter: Payer: Self-pay | Admitting: Orthopedic Surgery

## 2020-03-01 VITALS — Ht 69.0 in | Wt 295.0 lb

## 2020-03-01 DIAGNOSIS — M7501 Adhesive capsulitis of right shoulder: Secondary | ICD-10-CM | POA: Diagnosis not present

## 2020-03-01 DIAGNOSIS — M4807 Spinal stenosis, lumbosacral region: Secondary | ICD-10-CM | POA: Diagnosis not present

## 2020-03-01 NOTE — Progress Notes (Signed)
Office Visit Note   Patient: Christine Barrett           Date of Birth: 01/21/1961           MRN: [id number] Visit Date: 03/01/2020 Requested by: Jinny Sanders, MD [place],  [place] [address] PCP: Jinny Sanders, MD  Subjective: Chief Complaint  Patient presents with  . Lower Back - Pain, Follow-up    MRI lumbar review     HPI: [name] is a 59 year old patient with back pain and right shoulder pain.  MRI scan of the right shoulder in June showed some tendinitis and bursitis.  Cuff tear.  MRIs of the lumbar spine shows mild lumbar spine spondylosis with no acute osseous injury.  Did review the MRI scan with the patient.  She hasSome very mild broad-based disc protrusions but nothing really unexpected for someone this age.              ROS: All systems reviewed are negative as they relate to the chief complaint within the history of present illness.  Patient denies  fevers or chills.   Assessment & Plan: Visit Diagnoses:  1. Spinal stenosis of lumbosacral region   2. Adhesive capsulitis of right shoulder     Plan: Impression is right shoulder pain with bursitis and mild tendinosis.  Plan therapy for that with below shoulder stretching and strengthening and modalities as needed.  For the low back she is having some low back pain.  The scan itself is mildly underwhelming for the amount of pain she is having.  Nonetheless we talked about diagnostic and therapeutic epidural steroid injection versus therapy.  The amount of pain she is having now she wants to try therapy first.  If the pain increases I think injection is indicated.  Follow-up as needed  Follow-Up Instructions: Return if symptoms worsen or fail to improve.   Orders:  Orders Placed This Encounter  Procedures  . Ambulatory referral to Physical Therapy   No orders of the defined types were placed in this encounter.     Procedures: No procedures performed   Clinical Data: No additional  findings.  Objective: Vital Signs: Ht 5\' 9"  (1.753 m)   Wt 295 lb (133.8 kg)   BMI 43.56 kg/m   Physical Exam:   Constitutional: Patient appears well-developed HEENT:  Head: Normocephalic Eyes:EOM are normal Neck: Normal range of motion Cardiovascular: Normal rate Pulmonary/chest: Effort normal Neurologic: Patient is alert Skin: Skin is warm Psychiatric: Patient has normal mood and affect    Ortho Exam: Ortho exam demonstrates normal gait alignment.  No nerve root tension signs.  Excellent ankle dorsiflexion plantarflexion strength with palpable pedal pulses and no real pitting edema in the legs.  No groin pain with internal extra rotation of the legs.  No definite paresthesias L1-S1 bilaterally although she has mild difference in sensation to touch in the L4 distribution left versus right.  Some mild pain with forward and lateral bending.  No limping.  Shoulder range of motion is full in that exam is unchanged from prior visits.  Specialty Comments:  No specialty comments available.  Imaging: No results found.   PMFS History: Patient Active Problem List   Diagnosis Date Noted  . Left hip pain 01/23/2020  . Acute midline low back pain 01/09/2020  . COVID-19 virus infection 06/28/2019  . Ear pain, left 03/30/2019  . Nausea 02/08/2018  . Acute pain of left knee 08/28/2016  . OSA (obstructive sleep apnea)  06/10/2016  . Costochondritis 06/09/2016  . Prediabetes 05/25/2016  . Bilateral leg cramps 02/07/2016  . Peripheral edema 01/29/2016  . Crescendo angina ([place]) 08/05/2015  . Essential hypertension   . GERD (gastroesophageal reflux disease) 08/10/2014  . Menopausal syndrome 08/10/2014  . Accelerated hypertension with heart disease and without congestive heart failure 10/03/2013  . CAD S/P percutaneous coronary angioplasty - Ostial AVG Cx - Scoring balloon PTCA 10/03/2013  . Hyperlipidemia with target LDL less than 70 10/02/2013  . H/O non-ST elevation myocardial  infarction (NSTEMI) 10/02/2013  . Normocytic anemia, not due to blood loss   . Allergic rhinitis 03/17/2007  . MIGRAINE, COMMON W/O INTRACTABLE MIGRAINE 03/16/2007   Past Medical History:  Diagnosis Date  . Allergy    SEASONAL  . Anemia   . CAD S/P percutaneous coronary angioplasty 09/2013   a) Ostial AV G Cx - 2.5 mm Angiosculpt; mid LAD 40-60%; b) Myoview 07/2014: LOW RISK, small-severe fixed inferior defect c/w infarct w/o peri-infarct ischemia.  . Chronic bronchitis ([place])    "frequently; not q yr" (10/03/2013)  . Diverticulosis   . Essential hypertension    with prior Accelerated HTN  . GERD (gastroesophageal reflux disease)   . Hiatal hernia   . Hx of non-ST elevation myocardial infarction (NSTEMI) 10/01/2013   Due to Accelerated HTN with existing CAD  . Hyperlipemia   . Migraine    "@ least once/month" (10/03/2013)  . OSA (obstructive sleep apnea) 06/10/2016  . Schatzki's ring   . Seasonal allergies   . Sinusitis     Family History  Adopted: Yes  Problem Relation Age of Onset  . Diabetes Mother   . Breast cancer Sister   . Hypertension Sister   . Colon cancer Neg Hx   . Heart attack Neg Hx   . Stroke Neg Hx     Past Surgical History:  Procedure Laterality Date  . ABDOMINAL HYSTERECTOMY  1994   "partial"  . CARDIAC CATHETERIZATION N/A 08/05/2015   Procedure: Left Heart Cath and Coronary Angiography;  Surgeon: [name], MD;  Location: [facility];  Service: Cardiovascular;  Laterality: N/A;  . CESAREAN SECTION  1989  . COLONOSCOPY    . CORONARY ANGIOPLASTY  10/03/2013   95% ostial AV G Cx - 2.5 mm AngioSculpt Balloon PTCA; mid LAD 40-60%  . LEFT HEART CATHETERIZATION WITH CORONARY ANGIOGRAM N/A 10/02/2013   Procedure: LEFT HEART CATHETERIZATION WITH CORONARY ANGIOGRAM;  Surgeon: [name], MD;  [facility]: [facility];  Service: Cardiovascular;  Laterality: N/A;  . NASAL SEPTOPLASTY W/ TURBINOPLASTY  ~ 2007  . NM MYOVIEW LTD  08/22/2014    Low risk stress  nuclear study with a small, severe, fixed defect in the distal inferior wall/apex suggestive of small prior infarct; no ischemia.  LV Wall Motion: NL LV Function; NL Wall Motion  . PERCUTANEOUS CORONARY STENT INTERVENTION (PCI-S) N/A 10/03/2013   cutting balloon angioplasty only no stent.   . TRANSTHORACIC ECHOCARDIOGRAM  10/02/2013   EF 55-60%; mild LVH, no RWMA,   . TUBAL LIGATION  1989   Social History   Occupational History  . Occupation: Research scientist (life sciences)  Tobacco Use  . Smoking status: Never Smoker  . Smokeless tobacco: Never Used  Vaping Use  . Vaping Use: Never used  Substance and Sexual Activity  . Alcohol use: Yes    Alcohol/week: 0.0 standard drinks    Comment: occ  . Drug use: No  . Sexual activity: Yes    Birth  control/protection: Surgical

## 2020-03-14 ENCOUNTER — Encounter: Payer: Self-pay | Admitting: Orthopedic Surgery

## 2020-03-15 ENCOUNTER — Telehealth: Payer: Self-pay | Admitting: Orthopedic Surgery

## 2020-03-15 NOTE — Telephone Encounter (Signed)
Patient called requesting an updated on status of pain in shoulder and arm. Please call patient at [phone].

## 2020-03-18 NOTE — Telephone Encounter (Signed)
Please see my chart message I sent you about this patient.

## 2020-03-19 ENCOUNTER — Other Ambulatory Visit: Payer: Self-pay | Admitting: Orthopedic Surgery

## 2020-03-19 MED ORDER — ACETAMINOPHEN-CODEINE #3 300-30 MG PO TABS: 1.0000 | ORAL_TABLET | Freq: Three times a day (TID) | ORAL | 0 refills | Status: DC | PRN

## 2020-03-19 NOTE — Telephone Encounter (Signed)
[place] for another intra articular shot and t 3 1 po q 8 # 35 thx

## 2020-03-19 NOTE — Telephone Encounter (Signed)
Done thx

## 2020-03-19 NOTE — Telephone Encounter (Signed)
Duplicate message in chart.  

## 2020-03-19 NOTE — Telephone Encounter (Signed)
Patient called states she is in severe pain. Patient  is awaiting for Dr. [name] to give instruction about right shoulder pains. Patient is asking for pain medication. Patient states she is taking over the counter meds but it is not touching her pain at all. Please send prescription to [facility]. Please call patient when medication has been sent in. Patient phone number is [phone].

## 2020-03-19 NOTE — Telephone Encounter (Signed)
appt made for repeat injection. Medication called to pharmacy. Patient aware.

## 2020-03-19 NOTE — Telephone Encounter (Signed)
Patient has called/my chart messaged multiple times. Please advise.

## 2020-03-19 NOTE — Telephone Encounter (Signed)
See below. thx

## 2020-03-25 ENCOUNTER — Ambulatory Visit: Payer: Self-pay

## 2020-03-25 ENCOUNTER — Ambulatory Visit (INDEPENDENT_AMBULATORY_CARE_PROVIDER_SITE_OTHER): Payer: 59 | Admitting: Orthopedic Surgery

## 2020-03-25 ENCOUNTER — Encounter: Payer: Self-pay | Admitting: Orthopedic Surgery

## 2020-03-25 DIAGNOSIS — M7501 Adhesive capsulitis of right shoulder: Secondary | ICD-10-CM | POA: Diagnosis not present

## 2020-03-25 MED ORDER — BUPIVACAINE HCL 0.5 % IJ SOLN
3.0000 mL | INTRAMUSCULAR | Status: AC | PRN
  Administered 2020-03-25: 3 mL via INTRA_ARTICULAR

## 2020-03-25 MED ORDER — TRIAMCINOLONE ACETONIDE 40 MG/ML IJ SUSP
60.0000 mg | INTRAMUSCULAR | Status: AC | PRN
  Administered 2020-03-25: 60 mg via INTRA_ARTICULAR

## 2020-03-25 NOTE — Progress Notes (Signed)
Office Visit Note   Patient: Christine Barrett           Date of Birth: Sep 27, 1960           MRN: [id number] Visit Date: 03/25/2020 Requested by: Jinny Sanders, MD [place],  [place] [address] PCP: Jinny Sanders, MD  Subjective: Chief Complaint  Patient presents with  . Right Shoulder - Pain    HPI: Christine Barrett is a 59 year old patient with right shoulder pain.  Has partial-thickness supraspinatus tear but also adhesive capsulitis.  Intra-articular injection in June of this year by Dr. Zannie Kehr gave very good relief until now.  She is had some recurrent symptoms of pain and stiffness.  Hurts her to lift.  Denies any mechanical symptoms in the shoulder.              ROS: All systems reviewed are negative as they relate to the chief complaint within the history of present illness.  Patient denies  fevers or chills.   Assessment & Plan: Visit Diagnoses: No diagnosis found.  Plan: Impression is recurrent adhesive capsulitis with recurrent symptoms.  Plan is for third and final intra-articular injection today.  Had prior posterior injection which did not help much which was likely a subacromial injection.  This will be the second glenohumeral joint injection which I think should be enough to get her over the hump as far as the adhesive capsulitis goes.  Rotator cuff strength is good on exam today.  Follow-up as needed.  Follow-Up Instructions: No follow-ups on file.   Orders:  No orders of the defined types were placed in this encounter.  No orders of the defined types were placed in this encounter.     Procedures: No procedures performed   Clinical Data: No additional findings.  Objective: Vital Signs: There were no vitals taken for this visit.  Physical Exam:   Constitutional: Patient appears well-developed HEENT:  Head: Normocephalic Eyes:EOM are normal Neck: Normal range of motion Cardiovascular: Normal rate Pulmonary/chest: Effort normal Neurologic:  Patient is alert Skin: Skin is warm Psychiatric: Patient has normal mood and affect    Ortho Exam: Ortho exam demonstrates good cervical spine range of motion.  5 out of 5 grip EPL FPL [place]/extension bicep tricep deltoid strength.  Does have a little bit of restricted forward flexion abduction on the right compared to the left by about 10 to 15 degrees.  Rotator cuff strength is good infraspinatus supraspinatus subscap muscle testing.  No AC joint tenderness on the right-hand Barrett.  No other masses lymphadenopathy or skin changes noted in the shoulder girdle region.  Specialty Comments:  No specialty comments available.  Imaging: No results found.   PMFS History: Patient Active Problem List   Diagnosis Date Noted  . Left hip pain 01/23/2020  . Acute midline low back pain 01/09/2020  . COVID-19 virus infection 06/28/2019  . Ear pain, left 03/30/2019  . Nausea 02/08/2018  . Acute pain of left knee 08/28/2016  . OSA (obstructive sleep apnea) 06/10/2016  . Costochondritis 06/09/2016  . Prediabetes 05/25/2016  . Bilateral leg cramps 02/07/2016  . Peripheral edema 01/29/2016  . Crescendo angina ([place]) 08/05/2015  . Essential hypertension   . GERD (gastroesophageal reflux disease) 08/10/2014  . Menopausal syndrome 08/10/2014  . Accelerated hypertension with heart disease and without congestive heart failure 10/03/2013  . CAD S/P percutaneous coronary angioplasty - Ostial AVG Cx - Scoring balloon PTCA 10/03/2013  . Hyperlipidemia with target LDL less than  70 10/02/2013  . H/O non-ST elevation myocardial infarction (NSTEMI) 10/02/2013  . Normocytic anemia, not due to blood loss   . Allergic rhinitis 03/17/2007  . MIGRAINE, COMMON W/O INTRACTABLE MIGRAINE 03/16/2007   Past Medical History:  Diagnosis Date  . Allergy    SEASONAL  . Anemia   . CAD S/P percutaneous coronary angioplasty 09/2013   a) Ostial AV G Cx - 2.5 mm Angiosculpt; mid LAD 40-60%; b) Myoview 07/2014: LOW RISK,  small-severe fixed inferior defect c/w infarct w/o peri-infarct ischemia.  . Chronic bronchitis ([place])    "frequently; not q yr" (10/03/2013)  . Diverticulosis   . Essential hypertension    with prior Accelerated HTN  . GERD (gastroesophageal reflux disease)   . Hiatal hernia   . Hx of non-ST elevation myocardial infarction (NSTEMI) 10/01/2013   Due to Accelerated HTN with existing CAD  . Hyperlipemia   . Migraine    "@ least once/month" (10/03/2013)  . OSA (obstructive sleep apnea) 06/10/2016  . Schatzki's ring   . Seasonal allergies   . Sinusitis     Family History  Adopted: Yes  Problem Relation Age of Onset  . Diabetes Mother   . Breast cancer Sister   . Hypertension Sister   . Colon cancer Neg Hx   . Heart attack Neg Hx   . Stroke Neg Hx     Past Surgical History:  Procedure Laterality Date  . ABDOMINAL HYSTERECTOMY  1994   "partial"  . CARDIAC CATHETERIZATION N/A 08/05/2015   Procedure: Left Heart Cath and Coronary Angiography;  Surgeon: [name], MD;  Location: [facility];  Service: Cardiovascular;  Laterality: N/A;  . CESAREAN SECTION  1989  . COLONOSCOPY    . CORONARY ANGIOPLASTY  10/03/2013   95% ostial AV G Cx - 2.5 mm AngioSculpt Balloon PTCA; mid LAD 40-60%  . LEFT HEART CATHETERIZATION WITH CORONARY ANGIOGRAM N/A 10/02/2013   Procedure: LEFT HEART CATHETERIZATION WITH CORONARY ANGIOGRAM;  Surgeon: [name], MD;  Location: [facility]ATH [facility];  Service: Cardiovascular;  Laterality: N/A;  . NASAL SEPTOPLASTY W/ TURBINOPLASTY  ~ 2007  . NM MYOVIEW LTD  08/22/2014    Low risk stress nuclear study with a small, severe, fixed defect in the distal inferior wall/apex suggestive of small prior infarct; no ischemia.  LV Wall Motion: NL LV Function; NL Wall Motion  . PERCUTANEOUS CORONARY STENT INTERVENTION (PCI-S) N/A 10/03/2013   cutting balloon angioplasty only no stent.   . TRANSTHORACIC ECHOCARDIOGRAM  10/02/2013   EF 55-60%; mild LVH, no RWMA,   . TUBAL LIGATION   1989   Social History   Occupational History  . Occupation: Research scientist (life sciences)  Tobacco Use  . Smoking status: Never Smoker  . Smokeless tobacco: Never Used  Vaping Use  . Vaping Use: Never used  Substance and Sexual Activity  . Alcohol use: Yes    Alcohol/week: 0.0 standard drinks    Comment: occ  . Drug use: No  . Sexual activity: Yes    Birth control/protection: Surgical

## 2020-03-25 NOTE — Progress Notes (Signed)
   [name] - 59 y.o. female MRN [id number]  Date of birth: 1961/01/22  Office Visit Note: Visit Date: 03/25/2020 PCP: Jinny Sanders, MD Referred by: Jinny Sanders, MD  Subjective: Chief Complaint  Patient presents with  . Right Shoulder - Pain   HPI:  Christine Barrett is a 59 y.o. female who comes in today For repeat Right intraarticular glenohumeral injection.   ROS Otherwise per HPI.  Assessment & Plan: Visit Diagnoses:  1. Adhesive capsulitis of right shoulder     Plan: No additional findings.   Meds & Orders: No orders of the defined types were placed in this encounter.  No orders of the defined types were placed in this encounter.   Follow-up: Return if symptoms worsen or fail to improve.   Procedures: Large Joint Inj: R glenohumeral on 03/25/2020 3:36 PM Indications: pain and diagnostic evaluation Details: 22 G 3.5 in needle, fluoroscopy-guided anteromedial approach  Arthrogram: No  Medications: 3 mL bupivacaine 0.5 %; 60 mg triamcinolone acetonide 40 MG/ML Outcome: tolerated well, no immediate complications  There was excellent flow of contrast producing a partial arthrogram of the glenohumeral joint. The patient did have relief of symptoms during the anesthetic phase of the injection. Procedure, treatment alternatives, risks and benefits explained, specific risks discussed. Consent was given by the patient. Immediately prior to procedure a time out was called to verify the correct patient, procedure, equipment, support staff and site/side marked as required. Patient was prepped and draped in the usual sterile fashion.      No notes on file   Clinical History: No specialty comments available.     Objective:  VS:  HT:    WT:   BMI:     BP:   HR: bpm  TEMP: ( )  RESP:  Physical Exam   Imaging: No results found.

## 2020-03-25 NOTE — Addendum Note (Signed)
Addended by: Raymondo Band on: 03/25/2020 03:38 PM   Modules accepted: Orders

## 2020-05-09 ENCOUNTER — Other Ambulatory Visit (INDEPENDENT_AMBULATORY_CARE_PROVIDER_SITE_OTHER): Payer: 59

## 2020-05-09 ENCOUNTER — Telehealth: Payer: Self-pay | Admitting: Family Medicine

## 2020-05-09 ENCOUNTER — Other Ambulatory Visit: Payer: Self-pay

## 2020-05-09 DIAGNOSIS — R7303 Prediabetes: Secondary | ICD-10-CM

## 2020-05-09 DIAGNOSIS — D649 Anemia, unspecified: Secondary | ICD-10-CM

## 2020-05-09 DIAGNOSIS — E785 Hyperlipidemia, unspecified: Secondary | ICD-10-CM

## 2020-05-09 DIAGNOSIS — I10 Essential (primary) hypertension: Secondary | ICD-10-CM

## 2020-05-09 LAB — CBC WITH DIFFERENTIAL/PLATELET
Basophils Absolute: 0 10*3/uL (ref 0.0–0.1)
Basophils Relative: 0.8 % (ref 0.0–3.0)
Eosinophils Absolute: 0.2 10*3/uL (ref 0.0–0.7)
Eosinophils Relative: 3.5 % (ref 0.0–5.0)
HCT: 38 % (ref 36.0–46.0)
Hemoglobin: 12.1 g/dL (ref 12.0–15.0)
Lymphocytes Relative: 43.7 % (ref 12.0–46.0)
Lymphs Abs: 2.4 10*3/uL (ref 0.7–4.0)
MCHC: 31.9 g/dL (ref 30.0–36.0)
MCV: 84.7 fl (ref 78.0–100.0)
Monocytes Absolute: 0.5 10*3/uL (ref 0.1–1.0)
Monocytes Relative: 9.3 % (ref 3.0–12.0)
Neutro Abs: 2.4 10*3/uL (ref 1.4–7.7)
Neutrophils Relative %: 42.7 % — ABNORMAL LOW (ref 43.0–77.0)
Platelets: 206 10*3/uL (ref 150.0–400.0)
RBC: 4.49 Mil/uL (ref 3.87–5.11)
RDW: 13.9 % (ref 11.5–15.5)
WBC: 5.5 10*3/uL (ref 4.0–10.5)

## 2020-05-09 LAB — COMPREHENSIVE METABOLIC PANEL
ALT: 18 U/L (ref 0–35)
AST: 15 U/L (ref 0–37)
Albumin: 4 g/dL (ref 3.5–5.2)
Alkaline Phosphatase: 66 U/L (ref 39–117)
BUN: 14 mg/dL (ref 6–23)
CO2: 29 mEq/L (ref 19–32)
Calcium: 9 mg/dL (ref 8.4–10.5)
Chloride: 106 mEq/L (ref 96–112)
Creatinine, Ser: 0.74 mg/dL (ref 0.40–1.20)
GFR: 89.1 mL/min (ref >60.00)
Glucose, Bld: 129 mg/dL — ABNORMAL HIGH (ref 70–99)
Potassium: 3.9 mEq/L (ref 3.5–5.1)
Sodium: 142 mEq/L (ref 135–145)
Total Bilirubin: 0.7 mg/dL (ref 0.2–1.2)
Total Protein: 6.3 g/dL (ref 6.0–8.3)

## 2020-05-09 LAB — LIPID PANEL
Cholesterol: 206 mg/dL — ABNORMAL HIGH (ref 0–200)
HDL: 44.9 mg/dL (ref >39.00)
LDL Cholesterol: 133 mg/dL — ABNORMAL HIGH (ref 0–99)
NonHDL: 161.28
Total CHOL/HDL Ratio: 5
Triglycerides: 143 mg/dL (ref 0.0–149.0)
VLDL: 28.6 mg/dL (ref 0.0–40.0)

## 2020-05-09 LAB — HEMOGLOBIN A1C: Hgb A1c MFr Bld: 6.8 % — ABNORMAL HIGH (ref 4.6–6.5)

## 2020-05-09 NOTE — Progress Notes (Signed)
No critical labs need to be addressed urgently. We will discuss labs in detail at upcoming office visit.   

## 2020-05-09 NOTE — Telephone Encounter (Signed)
-----   Message from Cloyd Stagers, RT sent at 04/25/2020 10:17 AM EDT ----- Regarding: Lab Orders for Thursday 11.11.2021 Please place lab orders for Thursday 11.11.2021, office visit for physical on Friday 11.19.2021 Thank you, Dyke Maes RT(R)

## 2020-05-17 ENCOUNTER — Other Ambulatory Visit: Payer: Self-pay

## 2020-05-17 ENCOUNTER — Encounter: Payer: Self-pay | Admitting: Family Medicine

## 2020-05-17 ENCOUNTER — Ambulatory Visit (INDEPENDENT_AMBULATORY_CARE_PROVIDER_SITE_OTHER): Payer: 59 | Admitting: Family Medicine

## 2020-05-17 VITALS — BP 140/84 | HR 86 | Temp 97.5°F | Ht 69.0 in | Wt 291.5 lb

## 2020-05-17 DIAGNOSIS — Z0000 Encounter for general adult medical examination without abnormal findings: Secondary | ICD-10-CM | POA: Diagnosis not present

## 2020-05-17 DIAGNOSIS — Z23 Encounter for immunization: Secondary | ICD-10-CM | POA: Diagnosis not present

## 2020-05-17 DIAGNOSIS — E1169 Type 2 diabetes mellitus with other specified complication: Secondary | ICD-10-CM | POA: Diagnosis not present

## 2020-05-17 DIAGNOSIS — E1159 Type 2 diabetes mellitus with other circulatory complications: Secondary | ICD-10-CM

## 2020-05-17 DIAGNOSIS — E66812 Obesity, class 2: Secondary | ICD-10-CM | POA: Insufficient documentation

## 2020-05-17 DIAGNOSIS — E785 Hyperlipidemia, unspecified: Secondary | ICD-10-CM

## 2020-05-17 DIAGNOSIS — B353 Tinea pedis: Secondary | ICD-10-CM

## 2020-05-17 DIAGNOSIS — I2510 Atherosclerotic heart disease of native coronary artery without angina pectoris: Secondary | ICD-10-CM

## 2020-05-17 DIAGNOSIS — E119 Type 2 diabetes mellitus without complications: Secondary | ICD-10-CM | POA: Insufficient documentation

## 2020-05-17 DIAGNOSIS — I152 Hypertension secondary to endocrine disorders: Secondary | ICD-10-CM

## 2020-05-17 DIAGNOSIS — R252 Cramp and spasm: Secondary | ICD-10-CM

## 2020-05-17 DIAGNOSIS — Z9861 Coronary angioplasty status: Secondary | ICD-10-CM

## 2020-05-17 DIAGNOSIS — Z6841 Body Mass Index (BMI) 40.0 and over, adult: Secondary | ICD-10-CM | POA: Insufficient documentation

## 2020-05-17 LAB — HM DIABETES FOOT EXAM

## 2020-05-17 MED ORDER — EZETIMIBE 10 MG PO TABS
10.0000 mg | ORAL_TABLET | Freq: Every day | ORAL | 3 refills | Status: DC
Start: 2020-05-17 — End: 2021-02-07

## 2020-05-17 MED ORDER — ISOSORBIDE MONONITRATE ER 60 MG PO TB24
120.0000 mg | ORAL_TABLET | Freq: Every day | ORAL | 3 refills | Status: DC
Start: 2020-05-17 — End: 2021-02-07

## 2020-05-17 MED ORDER — CLOTRIMAZOLE 1 % EX CREA: 1.0000 "application " | TOPICAL_CREAM | Freq: Two times a day (BID) | CUTANEOUS | 1 refills | Status: DC

## 2020-05-17 NOTE — Telephone Encounter (Signed)
Surgery with manipulation under anesthesia and rotator interval release is the most aggressive option.  Otherwise she can just continue to try to keep the shoulder moving and get it so that it is less painful with more motion.  That may take 6 to 9 months.  Another injection not indicated.  Not uncommon for frozen shoulder to be painful for 9 to 12 months.

## 2020-05-17 NOTE — Assessment & Plan Note (Signed)
Resolved off statin. 

## 2020-05-17 NOTE — Assessment & Plan Note (Signed)
New diagnosis.[name] reviewed diet and lifestyle changes.. follow up in 3 onths.

## 2020-05-17 NOTE — Patient Instructions (Addendum)
We will call with lipid referral and nutrition referral.  Work on health low carb diet and increase exercise.  Set up [place] booster. Set up yearly eye exam for diabetes and have the opthalmologist send [place] a copy of the evaluation for the chart. Apply topical cream to feet as directed for fungal infection.   Athlete's Foot  Athlete's foot (tinea pedis) is a fungal infection of the skin on your feet. It often occurs on the skin that is between or underneath the toes. It can also occur on the soles of your feet. The infection can spread from person to person (is contagious). It can also spread when a person's bare feet come in contact with the fungus on shower floors or on items such as shoes. What are the causes? This condition is caused by a fungus that grows in warm, moist places. You can get athlete's foot by sharing shoes, shower stalls, towels, and wet floors with someone who is infected. Not washing your feet or changing your socks often enough can also lead to athlete's foot. What increases the risk? This condition is more likely to develop in:  Men.  People who have a weak body defense system (immune system).  People who have diabetes.  People who use public showers, such as at a gym.  People who wear heavy-duty shoes, such as Environmental manager.  Seasons with warm, humid weather. What are the signs or symptoms? Symptoms of this condition include:  Itchy areas between your toes or on the soles of your feet.  White, flaky, or scaly areas between your toes or on the soles of your feet.  Very itchy small blisters between your toes or on the soles of your feet.  Small cuts in your skin. These cuts can become infected.  Thick or discolored toenails. How is this diagnosed? This condition may be diagnosed with a physical exam and a review of your medical history. Your health care provider may also take a skin or toenail sample to examine under a microscope. How is  this treated? This condition is treated with antifungal medicines. These may be applied as powders, ointments, or creams. In severe cases, an oral antifungal medicine may be given. Follow these instructions at home: Medicines  Apply or take over-the-counter and prescription medicines only as told by your health care provider.  Apply your antifungal medicine as told by your health care provider. Do not stop using the antifungal even if your condition improves. Foot care  Do not scratch your feet.  Keep your feet dry: ? Wear cotton or wool socks. Change your socks every day or if they become wet. ? Wear shoes that allow air to flow, such as sandals or canvas tennis shoes.  Wash and dry your feet, including the area between your toes. Also, wash and dry your feet: ? Every day or as told by your health care provider. ? After exercising. General instructions  Do not let others use towels, shoes, nail clippers, or other personal items that touch your feet.  Protect your feet by wearing sandals in wet areas, such as locker rooms and shared showers.  Keep all follow-up visits as told by your health care provider. This is important.  If you have diabetes, keep your blood sugar under control. Contact a health care provider if:  You have a fever.  You have swelling, soreness, warmth, or redness in your foot.  Your feet are not getting better with treatment.  Your symptoms get  worse.  You have new symptoms. Summary  Athlete's foot (tinea pedis) is a fungal infection of the skin on your feet. It often occurs on skin that is between or underneath the toes.  This condition is caused by a fungus that grows in warm, moist places.  Symptoms include white, flaky, or scaly areas between your toes or on the soles of your feet.  This condition is treated with antifungal medicines.  Keep your feet clean. Always dry them thoroughly. This information is not intended to replace advice given  to you by your health care provider. Make sure you discuss any questions you have with your health care provider. Document Revised: 06/10/2017 Document Reviewed: 04/05/2017 Elsevier Patient Education  [place].

## 2020-05-17 NOTE — Addendum Note (Signed)
Addended by: Carter Kitten on: 05/17/2020 09:45 AM   Modules accepted: Orders

## 2020-05-17 NOTE — Progress Notes (Signed)
Chief Complaint  Patient presents with  . Annual Exam    History of Present Illness: HPI  The patient is here for annual wellness exam and preventative care.    Hypertension:    [place] control on current regimen. BP Readings from Last 3 Encounters:  05/17/20 140/84  02/12/20 128/76  01/23/20 (!) 130/80  Using medication without problems or lightheadedness:  Chest pain with exertion: Edema: Short of breath: Average home BPs: Other issues:  Elevated Cholesterol:  Not at goal LDL < 70  ( but improved from last check)given history of CAD.  Stopping atorvastatin improve leg pain and cramps. SE to lower dose 40 Mg.   Only  Zetia Lab Results  Component Value Date   CHOL 206 (H) 05/09/2020   HDL 44.90 05/09/2020   LDLCALC 133 (H) 05/09/2020   LDLDIRECT 192.2 02/11/2009   TRIG 143.0 05/09/2020   CHOLHDL 5 05/09/2020  Using medications without problems: Muscle aches:  Diet compliance: poor Exercise: none Other complaints:   Prediabetes  Worsened and now in range of Diabetes! Lab Results  Component Value Date   HGBA1C 6.8 (H) 05/09/2020     This visit occurred during the SARS-CoV-2 public health emergency.  Safety protocols were in place, including screening questions prior to the visit, additional usage of staff PPE, and extensive cleaning of exam room while observing appropriate contact time as indicated for disinfecting solutions.   COVID 19 screen:  No recent travel or known exposure to COVID19 The patient denies respiratory symptoms of COVID 19 at this time. The importance of social distancing was discussed today.     Review of Systems  Constitutional: Negative for chills and fever.  HENT: Negative for congestion and ear pain.   Eyes: Negative for pain and redness.  Respiratory: Positive for shortness of breath. Negative for cough.   Cardiovascular: Negative for chest pain, palpitations and leg swelling.  Gastrointestinal: Negative for abdominal pain, blood in  stool, constipation, diarrhea, nausea and vomiting.  Genitourinary: Negative for dysuria.  Musculoskeletal: Negative for falls and myalgias.  Skin: Negative for rash.  Neurological: Negative for dizziness.  Psychiatric/Behavioral: Negative for depression. The patient is not nervous/anxious.       Past Medical History:  Diagnosis Date  . Allergy    SEASONAL  . Anemia   . CAD S/P percutaneous coronary angioplasty 09/2013   a) Ostial AV G Cx - 2.5 mm Angiosculpt; mid LAD 40-60%; b) Myoview 07/2014: LOW RISK, small-severe fixed inferior defect c/w infarct w/o peri-infarct ischemia.  . Chronic bronchitis ([place])    "frequently; not q yr" (10/03/2013)  . Diverticulosis   . Essential hypertension    with prior Accelerated HTN  . GERD (gastroesophageal reflux disease)   . Hiatal hernia   . Hx of non-ST elevation myocardial infarction (NSTEMI) 10/01/2013   Due to Accelerated HTN with existing CAD  . Hyperlipemia   . Migraine    "@ least once/month" (10/03/2013)  . OSA (obstructive sleep apnea) 06/10/2016  . Schatzki's ring   . Seasonal allergies   . Sinusitis     reports that she has never smoked. She has never used smokeless tobacco. She reports current alcohol use. She reports that she does not use drugs.   Current Outpatient Medications:  .  acetaminophen-codeine (TYLENOL #3) 300-30 MG tablet, Take 1 tablet by mouth every 8 (eight) hours as needed for moderate pain., Disp: 35 tablet, Rfl: 0 .  albuterol (PROVENTIL HFA;VENTOLIN HFA) 108 (90 Base) MCG/ACT inhaler, INHALE 2 PUFFS  BY MOUTH EVERY 6 HOURS AS NEEDED FOR WHEEZING OR  SHORTNESS  OF  BREATH, Disp: 18 each, Rfl: 0 .  aspirin EC 81 MG EC tablet, Take 1 tablet (81 mg total) by mouth daily., Disp: , Rfl:  .  atorvastatin (LIPITOR) 80 MG tablet, Take 1 tablet (80 mg total) by mouth daily., Disp: 90 tablet, Rfl: 3 .  carvedilol (COREG) 25 MG tablet, Take 1 tablet (25 mg total) by mouth 2 (two) times daily with a meal., Disp: 180 tablet, Rfl:  3 .  ezetimibe (ZETIA) 10 MG tablet, Take 1 tablet (10 mg total) by mouth daily., Disp: 30 tablet, Rfl: 11 .  famotidine (PEPCID) 20 MG tablet, Take 20 mg by mouth 2 (two) times daily., Disp: , Rfl:  .  fluticasone (FLONASE) 50 MCG/ACT nasal spray, Place 2 sprays into both nostrils daily., Disp: 16 g, Rfl: 0 .  hydrochlorothiazide (HYDRODIURIL) 25 MG tablet, Take 1 tablet (25 mg total) by mouth daily., Disp: 90 tablet, Rfl: 0 .  ibuprofen (ADVIL) 800 MG tablet, Take 1 tablet (800 mg total) by mouth every 8 (eight) hours as needed., Disp: 30 tablet, Rfl: 0 .  isosorbide mononitrate (IMDUR) 60 MG 24 hr tablet, Take 2 tablets (120 mg total) by mouth daily., Disp: 180 tablet, Rfl: 3 .  losartan (COZAAR) 100 MG tablet, Take 1 tablet (100 mg total) by mouth daily., Disp: 90 tablet, Rfl: 3 .  montelukast (SINGULAIR) 10 MG tablet, TAKE 1 TABLET BY MOUTH AT BEDTIME, Disp: 90 tablet, Rfl: 3 .  Multiple Vitamin (MULTIVITAMIN WITH MINERALS) TABS tablet, Take 1 tablet by mouth daily., Disp: , Rfl:  .  Multiple Vitamins-Minerals (EMERGEN-C IMMUNE) PACK, Take 1 packet by mouth daily., Disp: , Rfl:  .  pantoprazole (PROTONIX) 40 MG tablet, Take 1 tablet by mouth once daily, Disp: 90 tablet, Rfl: 3   Observations/Objective: Blood pressure 140/84, pulse 86, temperature (!) 97.5 F (36.4 C), temperature source Temporal, height 5\' 9"  (1.753 m), weight 291 lb 8 oz (132.2 kg), SpO2 96 %.  Physical Exam Constitutional:      General: She is not in acute distress.    Appearance: Normal appearance. She is well-developed. She is not ill-appearing or toxic-appearing.  HENT:     Head: Normocephalic.     Right Ear: Hearing, tympanic membrane, ear canal and external ear normal. Tympanic membrane is not erythematous, retracted or bulging.     Left Ear: Hearing, tympanic membrane, ear canal and external ear normal. Tympanic membrane is not erythematous, retracted or bulging.     Nose: No mucosal edema or rhinorrhea.      Right Sinus: No maxillary sinus tenderness or frontal sinus tenderness.     Left Sinus: No maxillary sinus tenderness or frontal sinus tenderness.     Mouth/Throat:     Pharynx: Uvula midline.  Eyes:     General: Lids are normal. Lids are everted, no foreign bodies appreciated.     Conjunctiva/sclera: Conjunctivae normal.     Pupils: Pupils are equal, round, and reactive to light.  Neck:     Thyroid: No thyroid mass or thyromegaly.     Vascular: No carotid bruit.     Trachea: Trachea normal.  Cardiovascular:     Rate and Rhythm: Normal rate and regular rhythm.     Pulses: Normal pulses.     Heart sounds: Normal heart sounds, S1 normal and S2 normal. No murmur heard.  No friction rub. No gallop.   Pulmonary:  Effort: Pulmonary effort is normal. No tachypnea or respiratory distress.     Breath sounds: Normal breath sounds. No decreased breath sounds, wheezing, rhonchi or rales.  Abdominal:     General: Bowel sounds are normal.     Palpations: Abdomen is soft.     Tenderness: There is no abdominal tenderness.  Musculoskeletal:     Cervical back: Normal range of motion and neck supple.  Skin:    General: Skin is warm and dry.     Findings: No rash.  Neurological:     Mental Status: She is alert.  Psychiatric:        Mood and Affect: Mood is not anxious or depressed.        Speech: Speech normal.        Behavior: Behavior normal. Behavior is cooperative.        Thought Content: Thought content normal.        Judgment: Judgment normal.      Diabetic foot exam:  Abnormal inspection: dry flaky skin on soles of feet, leading edge No skin breakdown No calluses  Normal DP pulses Normal sensation to light touch and monofilament Nails normal  Assessment and Plan   The patient's preventative maintenance and recommended screening tests for an annual wellness exam were reviewed in full today. Brought up to date unless services declined.  Counselled on the importance of diet,  exercise, and its role in overall health and mortality. The patient's FH and SH was reviewed, including their home life, tobacco status, and drug and alcohol status.   Hep C screening neg 2020 Uptodate with Tdap. Given flu and encouraged COVID vaccine. GIVEN PNA 23 given new Dx DM. Last pap 2015 neg PAP and HPV neg... Partial hysterectomy, no family history of ovarian cancer. mammogram 08/2019, Sister with breast cancer. colon: 10/2014 repeat in 10 years Dr. Hilarie Fredrickson  Essential hypertension Well controlled. Continue current medication.   Hyperlipidemia with target LDL less than 70  Not at goal LDL < 70 with zetia.  SE to statins... encouraged lifestyle changes.   Refer to lipid clinic and nutritionist.   Morbid obesity ([place])  Contributing to multiple health problems.  Wt Readings from Last 3 Encounters:  05/17/20 291 lb 8 oz (132.2 kg)  03/01/20 295 lb (133.8 kg)  02/12/20 295 lb (133.8 kg)    Encouraged exercise, weight loss, healthy eating habits.   Type 2 diabetes mellitus with other circulatory complications, HTN, CVD ([place]) New diagnosis.[name] reviewed diet and lifestyle changes.. follow up in 3 onths.  Bilateral leg cramps Resolved off statin.    Eliezer Lofts, MD

## 2020-05-17 NOTE — Assessment & Plan Note (Signed)
Contributing to multiple health problems.  Wt Readings from Last 3 Encounters:  05/17/20 291 lb 8 oz (132.2 kg)  03/01/20 295 lb (133.8 kg)  02/12/20 295 lb (133.8 kg)    Encouraged exercise, weight loss, healthy eating habits.

## 2020-05-17 NOTE — Assessment & Plan Note (Addendum)
Not at goal LDL < 70 with zetia.  SE to statins... encouraged lifestyle changes.   Refer to lipid clinic and nutritionist.

## 2020-05-17 NOTE — Assessment & Plan Note (Signed)
Well controlled. Continue current medication.  

## 2020-05-21 ENCOUNTER — Other Ambulatory Visit: Payer: Self-pay | Admitting: Family Medicine

## 2020-05-21 ENCOUNTER — Telehealth: Payer: Self-pay

## 2020-05-21 NOTE — Telephone Encounter (Signed)
Discuss with Dr [name] and call patient back about questions she has regarding recovery if she decides to proceed with shoulder surgery. 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 She does a lot of driving for her work and wants to know how long will it be after surgery before she can resume driving. Surgery is on dominant hand and what are thoughts on how this will impact her ADL's and her job.  Call patient back once discussed with Dr [name].

## 2020-05-22 NOTE — Telephone Encounter (Signed)
Done thx

## 2020-05-22 NOTE — Telephone Encounter (Signed)
I think she would be out of work about a week.  After surgery we would need her to spend about 6 hours a day in the CPM machine.  She will be out of the sling after the first day.  I think functional return to work his reasonable expectation after 1 week.

## 2020-05-22 NOTE — Telephone Encounter (Signed)
See below. Please advise. Thanks.  

## 2020-05-22 NOTE — Telephone Encounter (Signed)
I called and talked with patient and advised per Dr [name]. She verbalized understanding.  She would like to proceed with surgery at this time. Can you please fill out blue sheet so I can give to [facility].

## 2020-05-28 LAB — HM DIABETES EYE EXAM

## 2020-05-29 ENCOUNTER — Telehealth: Payer: Self-pay | Admitting: Orthopedic Surgery

## 2020-05-29 NOTE — Telephone Encounter (Signed)
I called spoke with patient. She stated she is anxious to get surgery scheduled for sometime in December if possible. She said she had left a voicemail for you as well. Please call patient to further discuss

## 2020-05-29 NOTE — Telephone Encounter (Signed)
Patient called requesting a call back from [employer]. Patient did not disclose the reason for her call. Please call patient at [phone].

## 2020-05-30 NOTE — Telephone Encounter (Signed)
Patient is schedule for right shoulder manipulation under anestheisa, rotator interval release on 06-06-20 at [facility].  She has [employer]. Historically this Insurance will not cover the cost of  CPM machine. She is aware of the approximate cost of the machine when paying out of pocket.  Patient is asking if she will be approved for any physical therapy.  Please advise.   Pt's cb  [phone]

## 2020-05-30 NOTE — Telephone Encounter (Signed)
Please advise if will need PT.  Thanks.

## 2020-05-30 NOTE — Telephone Encounter (Signed)
Yes if no CPM available.  Could she get a black brace from [employer]

## 2020-05-31 ENCOUNTER — Other Ambulatory Visit: Payer: Self-pay

## 2020-05-31 NOTE — Telephone Encounter (Signed)
Patient is requesting a call from one of you guys to discuss recovery expectations for upcoming surgery Driving, PT, sling, CPM etc.  She said she is more afraid of the recovery of the surgery than she is the actual surgery.  [phone]   Christine Barrett has also submitted to [place] for black bracing to see if insurance will cover since in the past her insurance does not typically cover CPM's

## 2020-05-31 NOTE — Telephone Encounter (Signed)
Called and spoke with her

## 2020-06-03 ENCOUNTER — Other Ambulatory Visit (HOSPITAL_COMMUNITY)
Admission: RE | Admit: 2020-06-03 | Discharge: 2020-06-03 | Disposition: A | Discharge status: Home / Self Care | Payer: 59 | Source: Ambulatory Visit | Attending: Orthopedic Surgery | Admitting: Orthopedic Surgery

## 2020-06-03 DIAGNOSIS — Z01812 Encounter for preprocedural laboratory examination: Secondary | ICD-10-CM | POA: Diagnosis present

## 2020-06-03 DIAGNOSIS — Z20822 Contact with and (suspected) exposure to covid-19: Secondary | ICD-10-CM | POA: Insufficient documentation

## 2020-06-03 LAB — SARS CORONAVIRUS 2 (TAT 6-24 HRS): SARS Coronavirus 2: NEGATIVE

## 2020-06-03 NOTE — Pre-Procedure Instructions (Addendum)
Your procedure is scheduled on Thursday, December 9th.  Report to [facility] Entrance "A" at 1:15 P.M., and check in at the Admitting office.  Call this number if you have problems the morning of surgery:  [phone]  Call [phone] if you have any questions prior to your surgery date Monday-Friday 8am-4pm    Remember:  Do not eat after midnight the night before your surgery  You may drink clear liquids until 12:15 the afternoon of your surgery.   Clear liquids allowed are: Water, Non-Citrus Juices (without pulp), Carbonated Beverages, Clear Tea, Black Coffee Only, and Gatorade.    Take these medicines the morning of surgery with A SIP OF WATER  carvedilol (COREG)  famotidine (PEPCID) ezetimibe (ZETIA) fluticasone (FLONASE) isosorbide mononitrate (IMDUR)  levocetirizine (XYZAL) pantoprazole (PROTONIX)   Albuterol inhaler as needed. Please bring with you to the hospital.   Follow your surgeon's instructions on when to stop Aspirin.  If no instructions were given by your surgeon then you will need to call the office to get those instructions.    As of today, STOP taking any Aspirin (unless otherwise instructed by your surgeon) Aleve, Naproxen, Ibuprofen, Motrin, Advil, Goody's, BC's, all herbal medications, fish oil, and all vitamins.                      Do not wear jewelry, make up, or nail polish            Do not wear lotions, powders, perfumes, or deodorant.            Do not shave 48 hours prior to surgery.            Do not bring valuables to the hospital.            [facility] is not responsible for any belongings or valuables.  Do NOT Smoke (Tobacco/Vaping) or drink Alcohol 24 hours prior to your procedure If you use a CPAP at night, you may bring all equipment for your overnight stay.   Contacts, glasses, dentures or bridgework may not be worn into surgery.      For patients admitted to the hospital, discharge time will be determined by your treatment  team.   Patients discharged the day of surgery will not be allowed to drive home, and someone needs to stay with them for 24 hours.    Special instructions:   [place]- Preparing For Surgery  Before surgery, you can play an important role. Because skin is not sterile, your skin needs to be as free of germs as possible. You can reduce the number of germs on your skin by washing with CHG (chlorahexidine gluconate) Soap before surgery.  CHG is an antiseptic cleaner which kills germs and bonds with the skin to continue killing germs even after washing.    Oral Hygiene is also important to reduce your risk of infection.  Remember - BRUSH YOUR TEETH THE MORNING OF SURGERY WITH YOUR REGULAR TOOTHPASTE  Please do not use if you have an allergy to CHG or antibacterial soaps. If your skin becomes reddened/irritated stop using the CHG.  Do not shave (including legs and underarms) for at least 48 hours prior to first CHG shower. It is OK to shave your face.  Please follow these instructions carefully.   1. Shower the NIGHT BEFORE SURGERY and the MORNING OF SURGERY with CHG Soap.   2. If you chose to wash your hair, wash your hair first as usual  with your normal shampoo.  3. After you shampoo, rinse your hair and body thoroughly to remove the shampoo.  4. Use CHG as you would any other liquid soap. You can apply CHG directly to the skin and wash gently with a scrungie or a clean washcloth.   5. Apply the CHG Soap to your body ONLY FROM THE NECK DOWN.  Do not use on open wounds or open sores. Avoid contact with your eyes, ears, mouth and genitals (private parts). Wash Face and genitals (private parts)  with your normal soap.   6. Wash thoroughly, paying special attention to the area where your surgery will be performed.  7. Thoroughly rinse your body with warm water from the neck down.  8. DO NOT shower/wash with your normal soap after using and rinsing off the CHG Soap.  9. Pat yourself dry  with a CLEAN TOWEL.  10. Wear CLEAN PAJAMAS to bed the night before surgery  11. Place CLEAN SHEETS on your bed the night of your first shower and DO NOT SLEEP WITH PETS.   Day of Surgery: Wear Clean/Comfortable clothing the morning of surgery Do not apply any deodorants/lotions.   Remember to brush your teeth WITH YOUR REGULAR TOOTHPASTE.   Please read over the following fact sheets that you were given.

## 2020-06-04 ENCOUNTER — Encounter (HOSPITAL_COMMUNITY)
Admission: RE | Admit: 2020-06-04 | Discharge: 2020-06-04 | Disposition: A | Discharge status: Home / Self Care | Payer: 59 | Source: Ambulatory Visit | Attending: Orthopedic Surgery | Admitting: Orthopedic Surgery

## 2020-06-04 ENCOUNTER — Encounter (HOSPITAL_COMMUNITY): Payer: Self-pay

## 2020-06-04 ENCOUNTER — Other Ambulatory Visit: Payer: Self-pay

## 2020-06-04 ENCOUNTER — Telehealth: Payer: Self-pay | Admitting: Orthopedic Surgery

## 2020-06-04 DIAGNOSIS — Z01812 Encounter for preprocedural laboratory examination: Secondary | ICD-10-CM | POA: Insufficient documentation

## 2020-06-04 HISTORY — DX: Type 2 diabetes mellitus without complications: E11.9

## 2020-06-04 HISTORY — DX: Other reaction to spinal and lumbar puncture: G97.1

## 2020-06-04 LAB — BASIC METABOLIC PANEL
Anion gap: 9 (ref 5–15)
BUN: 10 mg/dL (ref 6–20)
CO2: 24 mmol/L (ref 22–32)
Calcium: 9.3 mg/dL (ref 8.9–10.3)
Chloride: 107 mmol/L (ref 98–111)
Creatinine, Ser: 0.69 mg/dL (ref 0.44–1.00)
GFR, Estimated: >60 mL/min (ref >60)
Glucose, Bld: 115 mg/dL — ABNORMAL HIGH (ref 70–99)
Potassium: 3.8 mmol/L (ref 3.5–5.1)
Sodium: 140 mmol/L (ref 135–145)

## 2020-06-04 LAB — CBC
HCT: 40.1 % (ref 36.0–46.0)
Hemoglobin: 12.7 g/dL (ref 12.0–15.0)
MCH: 27.5 pg (ref 26.0–34.0)
MCHC: 31.7 g/dL (ref 30.0–36.0)
MCV: 87 fL (ref 80.0–100.0)
Platelets: 255 10*3/uL (ref 150–400)
RBC: 4.61 MIL/uL (ref 3.87–5.11)
RDW: 14.2 % (ref 11.5–15.5)
WBC: 6.1 10*3/uL (ref 4.0–10.5)
nRBC: 0 % (ref 0.0–0.2)

## 2020-06-04 LAB — GLUCOSE, CAPILLARY: Glucose-Capillary: 103 mg/dL — ABNORMAL HIGH (ref 70–99)

## 2020-06-04 NOTE — Progress Notes (Signed)
PCP - Dr. Algie Coffer Cardiologist - Dr. Venida Jarvis  Chest x-ray - n/a EKG - 02/12/20 Stress Test - 2016 ECHO - 2015 Cardiac Cath -2017   Sleep Study - OSA+ CPAP - denies use. Has the machine but does not use it d/t uncomfortableness.    Newly diagnosed as diet-controlled diabetic. Last A1C on 05/09/20: 6.8.  Pt does not currently check CBG. CBG today at PAT: 103.  Blood Thinner Instructions: n/a Aspirin Instructions:L/D 06/02/20  ERAS Protcol -Yes, instructed to stop clears by 1215 on DOS. PRE-SURGERY Ensure or G2- not ordered.  COVID TEST- 06/03/20-negative.  Anesthesia review: Yes, hx of CAD.  Patient denies shortness of breath, fever, cough and chest pain at PAT appointment   All instructions explained to the patient, with a verbal understanding of the material. Patient agrees to go over the instructions while at home for a better understanding. Patient also instructed to self quarantine after being tested for COVID-19. The opportunity to ask questions was provided.    Coronavirus Screening  Have you experienced the following symptoms:  Cough yes/no: No Fever (>100.57F)  yes/no: No Runny nose yes/no: No Sore throat yes/no: No Difficulty breathing/shortness of breath  yes/no: No  Have you or a family member traveled in the last 14 days and where? yes/no: No   If the patient indicates "YES" to the above questions, their PAT will be rescheduled to limit the exposure to others and, the surgeon will be notified. THE PATIENT WILL NEED TO BE ASYMPTOMATIC FOR 14 DAYS.   If the patient is not experiencing any of these symptoms, the PAT nurse will instruct them to NOT bring anyone with them to their appointment since they may have these symptoms or traveled as well.   Please remind your patients and families that hospital visitation restrictions are in effect and the importance of the restrictions.

## 2020-06-04 NOTE — Telephone Encounter (Signed)
Patient would like to discuss recovery and in particular whether or not she will be doing any physical therapy.  I did offer this may be addressed at the post op visit rather than before.  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  She would like to talk with Dr. [name] before the surgery on 06-06-20 (this Thursday).

## 2020-06-05 NOTE — Telephone Encounter (Signed)
Called and SW pt, she spoke with Dr. [name]

## 2020-06-05 NOTE — Telephone Encounter (Signed)
Do you know if [employer] was able to get patient set up with black shoulder brace?

## 2020-06-05 NOTE — Anesthesia Preprocedure Evaluation (Addendum)
Anesthesia Evaluation  Patient identified by MRN, date of birth, ID band Patient awake    Reviewed: Allergy & Precautions  Airway Mallampati: II  TM Distance: >3 FB     Dental   Pulmonary    breath sounds clear to auscultation       Cardiovascular hypertension, + CAD   Rhythm:Regular Rate:Normal     Neuro/Psych    GI/Hepatic Neg liver ROS, hiatal hernia, GERD  ,  Endo/Other  diabetes  Renal/GU      Musculoskeletal   Abdominal   Peds  Hematology   Anesthesia Other Findings   Reproductive/Obstetrics                            Anesthesia Physical Anesthesia Plan  ASA: III  Anesthesia Plan: General   Post-op Pain Management:    Induction: Intravenous  PONV Risk Score and Plan: 3 and Ondansetron and Dexamethasone  Airway Management Planned: Oral ETT  Additional Equipment:   Intra-op Plan:   Post-operative Plan: Extubation in OR  Informed Consent: I have reviewed the patients History and Physical, chart, labs and discussed the procedure including the risks, benefits and alternatives for the proposed anesthesia with the patient or authorized representative who has indicated his/her understanding and acceptance.     Dental advisory given  Plan Discussed with: CRNA and Anesthesiologist  Anesthesia Plan Comments: (PAT note written 06/05/2020 by Myra Gianotti, PA-C. )       Anesthesia Quick Evaluation

## 2020-06-05 NOTE — Progress Notes (Signed)
Anesthesia Chart Review:  Case: [id number] Date/Time: 06/06/20 1502   Procedure: RIGHT SHOULDER MANIPULATION UNDER ANESTHESIA, ROTATOR INTERVAL RELEASE (Right )   Anesthesia type: General   Pre-op diagnosis: right shoulder adhesive capsulitis   Location: [facility] OR ROOM 07 / [place] [facility]   Surgeons: Meredith Pel, MD      DISCUSSION: Patient is a 59 year old female scheduled for the above procedure.  History includes never smoker, spinal headache, HTN, HLD, DM2, CAD (NSTEMI 10/01/13, s/p PTCA ostial AV groove CX 10/03/13; stable anatomy 07/2015), GERD, hiatal hernia, OSA (intolerant to CPAP), anemia, migraines.  BMI is consistent with morbid obesity. COVID-19 05/2019.   Stable coronary anatomy by 07/2015 cath. Medical therapy was recommended. (She also had a second opinion by Fay Records, MD with [facility] who also recommended medical management for stable chronic angina/small vessel disease.) Meds include Imdur and Coreg. Non-ischemic stress echo in 06/25/16. Last cardiologist visit was on 02/12/20 with Dr. Harrell Gave. No new testing ordered. One year follow-up recommended.  She denied shortness of breath, cough, fever, chest pain per PAT RN interview.  Last aspirin 06/02/2020.  Presurgical COVID-19 test negative on 06/03/2020. Anesthesia team to evaluate on the day of surgery.    VS: BP (!) 163/91   Pulse 77   Temp 37 C (Oral)   Resp 18   Ht 5\' 9"  (1.753 m)   Wt 131.2 kg   SpO2 100%   BMI 42.71 kg/m    PROVIDERS: Jinny Sanders, MD is PCP. Seen 05/17/20 for annual exam.  Buford Dresser, MD is cardiologist. Last visit 02/12/20.    LABS: Labs reviewed: Acceptable for surgery. A1c 6.8% on 04/29/20.  (all labs ordered are listed, but only abnormal results are displayed)  Labs Reviewed  GLUCOSE, CAPILLARY - Abnormal; Notable for the following components:      Result Value   Glucose-Capillary 103 (*)    All other components within normal limits  BASIC METABOLIC PANEL - Abnormal; Notable for  the following components:   Glucose, Bld 115 (*)    All other components within normal limits  CBC     IMAGES: MRI right shoulder 12/09/19: IMPRESSION: 1. Moderate tendinosis of the supraspinatus tendon with a partial-thickness articular surface tear anteriorly. 2. Mild tendinosis of the infraspinatus tendon with fraying along the bursal surface. 3. Mild subacromial/subdeltoid bursitis.   EKG: EKG 02/12/20 ([facility]):  Sinus rhythm with short PR Possible left atrial enlargement Indeterminate axis Nonspecific T wave abnormality   CV: LE Venous US 01/15/20: Summary:  RIGHT:  - No evidence of common femoral vein obstruction.  LEFT:  - There is no evidence of deep vein thrombosis in the lower extremity.  - No cystic structure found in the popliteal fossa.    Stress echo 06/25/16 [facility]): Summary:  Negative stress ECG for inducible ischemia at target heart rate.  Negative exercise echocardiography for inducible ischemia at target heart  rate. Estimated LVEF 55-60%.    Cardiac cath 08/05/15:  Colon Flattery 2nd Diag to 2nd Diag lesion, 80% stenosed. 2 small for PCI  Prox LAD to Mid LAD lesion, 60% stenosed.  Prox Cx to Mid Cx lesion, 50% stenosed. The lesion was previously treated with angioplasty one to two years ago. - Essentially stable coronary artery disease as compared to last catheterization. The PTCA site does have some restenosis but only 50% and a very focal segment. - Potential culprit for angiogram would be the diagonal lesion that is not amenable to PCI. If the patient were to have  worsening symptoms, would recommend noninvasive stress testing to evaluate the LAD which appears to be stable from 2015. - FFR and LV gram not performed due to concern for radial/brachial spasm.   Echo 10/02/13: Study Conclusions  - Left ventricle: The cavity size was normal. Wall thickness  was increased in a pattern of mild LVH. Systolic function  was normal. The estimated  ejection fraction was in the  range of 55% to 60%. Wall motion was normal; there were no  regional wall motion abnormalities.  - Right ventricle: The cavity size was normal. Systolic  function was normal.    Past Medical History:  Diagnosis Date  . Allergy    SEASONAL  . Anemia   . CAD S/P percutaneous coronary angioplasty 09/2013   a) Ostial AV G Cx - 2.5 mm Angiosculpt; mid LAD 40-60%; b) Myoview 07/2014: LOW RISK, small-severe fixed inferior defect c/w infarct w/o peri-infarct ischemia.  . Chronic bronchitis ([place])    "frequently; not q yr" (10/03/2013)  . Diabetes mellitus without complication ([place])    Type 2-diet controlled.   . Diverticulosis   . Essential hypertension    with prior Accelerated HTN  . GERD (gastroesophageal reflux disease)   . Hiatal hernia   . Hx of non-ST elevation myocardial infarction (NSTEMI) 10/01/2013   Due to Accelerated HTN with existing CAD  . Hyperlipemia   . Migraine    "@ least once/month" (10/03/2013)  . OSA (obstructive sleep apnea) 06/10/2016  . Schatzki's ring   . Seasonal allergies   . Sinusitis   . Spinal headache    during C-section of second child.     Past Surgical History:  Procedure Laterality Date  . ABDOMINAL HYSTERECTOMY  1994   "partial"  . CARDIAC CATHETERIZATION N/A 08/05/2015   Procedure: Left Heart Cath and Coronary Angiography;  Surgeon: [name], MD;  Location: [place] [facility] LAB;  Service: Cardiovascular;  Laterality: N/A;  . CESAREAN SECTION  1989  . COLONOSCOPY    . CORONARY ANGIOPLASTY  10/03/2013   95% ostial AV G Cx - 2.5 mm AngioSculpt Balloon PTCA; mid LAD 40-60%  . LEFT HEART CATHETERIZATION WITH CORONARY ANGIOGRAM N/A 10/02/2013   Procedure: LEFT HEART CATHETERIZATION WITH CORONARY ANGIOGRAM;  Surgeon: [name], MD;  Location: [facility] CATH LAB;  Service: Cardiovascular;  Laterality: N/A;  . NASAL SEPTOPLASTY W/ TURBINOPLASTY  ~ 2007  . NM MYOVIEW LTD  08/22/2014    Low risk stress nuclear study with a  small, severe, fixed defect in the distal inferior wall/apex suggestive of small prior infarct; no ischemia.  LV Wall Motion: NL LV Function; NL Wall Motion  . PERCUTANEOUS CORONARY STENT INTERVENTION (PCI-S) N/A 10/03/2013   cutting balloon angioplasty only no stent.   . TRANSTHORACIC ECHOCARDIOGRAM  10/02/2013   EF 55-60%; mild LVH, no RWMA,   . TUBAL LIGATION  1989    MEDICATIONS: . acetaminophen-codeine (TYLENOL #3) 300-30 MG tablet  . albuterol (PROVENTIL HFA;VENTOLIN HFA) 108 (90 Base) MCG/ACT inhaler  . aspirin EC 81 MG EC tablet  . atorvastatin (LIPITOR) 80 MG tablet  . BLACK ELDERBERRY PO  . carvedilol (COREG) 25 MG tablet  . clotrimazole (CLOTRIMAZOLE ANTI-FUNGAL) 1 % cream  . ezetimibe (ZETIA) 10 MG tablet  . famotidine (PEPCID) 20 MG tablet  . fluticasone (FLONASE) 50 MCG/ACT nasal spray  . hydrochlorothiazide (HYDRODIURIL) 25 MG tablet  . isosorbide mononitrate (IMDUR) 60 MG 24 hr tablet  . latanoprost (XALATAN) 0.005 % ophthalmic solution  . levocetirizine (XYZAL)  5 MG tablet  . losartan (COZAAR) 100 MG tablet  . Misc Natural Products (APPLE CIDER VINEGAR DIET PO)  . montelukast (SINGULAIR) 10 MG tablet  . Multiple Vitamin (MULTIVITAMIN WITH MINERALS) TABS tablet  . Multiple Vitamins-Minerals (EMERGEN-C IMMUNE) PACK  . pantoprazole (PROTONIX) 40 MG tablet   No current facility-administered medications for this encounter.  Med list, she is not currently taking Tylenol 3, Lipitor 80 mg, clotrimazole cream.  Myra Gianotti, PA-C Surgical Short Stay/Anesthesiology [facility] Phone [phone] [facility] Phone [phone] 06/05/2020 10:07 AM

## 2020-06-05 NOTE — Telephone Encounter (Signed)
See below. Patient wanting to discuss again. I know you have previously discussed with patient. She is wanting to speak with one of you.

## 2020-06-06 ENCOUNTER — Encounter (HOSPITAL_COMMUNITY): Payer: Self-pay | Admitting: Orthopedic Surgery

## 2020-06-06 ENCOUNTER — Ambulatory Visit (HOSPITAL_COMMUNITY): Payer: 59 | Admitting: Vascular Surgery

## 2020-06-06 ENCOUNTER — Other Ambulatory Visit: Payer: Self-pay

## 2020-06-06 ENCOUNTER — Encounter (HOSPITAL_COMMUNITY)
Admission: RE | Disposition: A | Discharge status: Home / Self Care | Payer: Self-pay | Source: Home / Self Care | Attending: Orthopedic Surgery

## 2020-06-06 ENCOUNTER — Ambulatory Visit (HOSPITAL_COMMUNITY): Payer: 59 | Admitting: Certified Registered"

## 2020-06-06 ENCOUNTER — Ambulatory Visit (HOSPITAL_COMMUNITY)
Admission: RE | Admit: 2020-06-06 | Discharge: 2020-06-06 | Disposition: A | Discharge status: Home / Self Care | Payer: 59 | Attending: Orthopedic Surgery | Admitting: Orthopedic Surgery

## 2020-06-06 DIAGNOSIS — M7501 Adhesive capsulitis of right shoulder: Secondary | ICD-10-CM | POA: Insufficient documentation

## 2020-06-06 DIAGNOSIS — E119 Type 2 diabetes mellitus without complications: Secondary | ICD-10-CM | POA: Insufficient documentation

## 2020-06-06 DIAGNOSIS — M75111 Incomplete rotator cuff tear or rupture of right shoulder, not specified as traumatic: Secondary | ICD-10-CM | POA: Insufficient documentation

## 2020-06-06 DIAGNOSIS — Z955 Presence of coronary angioplasty implant and graft: Secondary | ICD-10-CM | POA: Diagnosis not present

## 2020-06-06 DIAGNOSIS — Z880 Allergy status to penicillin: Secondary | ICD-10-CM | POA: Diagnosis not present

## 2020-06-06 DIAGNOSIS — I10 Essential (primary) hypertension: Secondary | ICD-10-CM | POA: Insufficient documentation

## 2020-06-06 DIAGNOSIS — Z803 Family history of malignant neoplasm of breast: Secondary | ICD-10-CM | POA: Diagnosis not present

## 2020-06-06 DIAGNOSIS — I252 Old myocardial infarction: Secondary | ICD-10-CM | POA: Insufficient documentation

## 2020-06-06 DIAGNOSIS — M7521 Bicipital tendinitis, right shoulder: Secondary | ICD-10-CM

## 2020-06-06 DIAGNOSIS — M75121 Complete rotator cuff tear or rupture of right shoulder, not specified as traumatic: Secondary | ICD-10-CM | POA: Diagnosis not present

## 2020-06-06 DIAGNOSIS — M65811 Other synovitis and tenosynovitis, right shoulder: Secondary | ICD-10-CM

## 2020-06-06 DIAGNOSIS — Z79899 Other long term (current) drug therapy: Secondary | ICD-10-CM | POA: Insufficient documentation

## 2020-06-06 DIAGNOSIS — Z8249 Family history of ischemic heart disease and other diseases of the circulatory system: Secondary | ICD-10-CM | POA: Insufficient documentation

## 2020-06-06 DIAGNOSIS — Z7982 Long term (current) use of aspirin: Secondary | ICD-10-CM | POA: Diagnosis not present

## 2020-06-06 DIAGNOSIS — I2510 Atherosclerotic heart disease of native coronary artery without angina pectoris: Secondary | ICD-10-CM | POA: Diagnosis not present

## 2020-06-06 DIAGNOSIS — Z833 Family history of diabetes mellitus: Secondary | ICD-10-CM | POA: Insufficient documentation

## 2020-06-06 HISTORY — PX: CAPSULAR RELEASE: SHX6293

## 2020-06-06 LAB — GLUCOSE, CAPILLARY: Glucose-Capillary: 171 mg/dL — ABNORMAL HIGH (ref 70–99)

## 2020-06-06 SURGERY — CAPSULAR RELEASE
Anesthesia: General | Site: Shoulder | Laterality: Right

## 2020-06-06 MED ORDER — ONDANSETRON HCL 4 MG/2ML IJ SOLN
INTRAMUSCULAR | Status: DC | PRN
  Administered 2020-06-06: 4 mg via INTRAVENOUS

## 2020-06-06 MED ORDER — MIDAZOLAM HCL 5 MG/5ML IJ SOLN
INTRAMUSCULAR | Status: DC | PRN
  Administered 2020-06-06: 2 mg via INTRAVENOUS

## 2020-06-06 MED ORDER — SUCCINYLCHOLINE CHLORIDE 200 MG/10ML IV SOSY
PREFILLED_SYRINGE | INTRAVENOUS | Status: DC | PRN
  Administered 2020-06-06: 100 mg via INTRAVENOUS

## 2020-06-06 MED ORDER — LIDOCAINE HCL (PF) 2 % IJ SOLN
INTRAMUSCULAR | Status: AC
  Filled 2020-06-06: qty 5

## 2020-06-06 MED ORDER — DEXAMETHASONE SODIUM PHOSPHATE 10 MG/ML IJ SOLN
INTRAMUSCULAR | Status: AC
  Filled 2020-06-06: qty 1

## 2020-06-06 MED ORDER — HYDROMORPHONE HCL 1 MG/ML IJ SOLN: 0.2500 mg | INTRAMUSCULAR | Status: DC | PRN

## 2020-06-06 MED ORDER — SUCCINYLCHOLINE CHLORIDE 200 MG/10ML IV SOSY
PREFILLED_SYRINGE | INTRAVENOUS | Status: AC
  Filled 2020-06-06: qty 10

## 2020-06-06 MED ORDER — FENTANYL CITRATE (PF) 250 MCG/5ML IJ SOLN
INTRAMUSCULAR | Status: AC
  Filled 2020-06-06: qty 5

## 2020-06-06 MED ORDER — PROPOFOL 10 MG/ML IV BOLUS
INTRAVENOUS | Status: AC
  Filled 2020-06-06: qty 20

## 2020-06-06 MED ORDER — CEFAZOLIN SODIUM-DEXTROSE 2-4 GM/100ML-% IV SOLN
2.0000 g | INTRAVENOUS | Status: AC
  Administered 2020-06-06: 3 g via INTRAVENOUS
  Filled 2020-06-06: qty 100

## 2020-06-06 MED ORDER — DEXAMETHASONE SODIUM PHOSPHATE 10 MG/ML IJ SOLN
INTRAMUSCULAR | Status: DC | PRN
  Administered 2020-06-06: 5 mg via INTRAVENOUS

## 2020-06-06 MED ORDER — FENTANYL CITRATE (PF) 100 MCG/2ML IJ SOLN
75.0000 ug | Freq: Once | INTRAMUSCULAR | Status: AC
  Administered 2020-06-06: 75 ug via INTRAVENOUS

## 2020-06-06 MED ORDER — VANCOMYCIN HCL 500 MG IV SOLR
INTRAVENOUS | Status: DC | PRN
  Administered 2020-06-06: 500 mg

## 2020-06-06 MED ORDER — FENTANYL CITRATE (PF) 250 MCG/5ML IJ SOLN
INTRAMUSCULAR | Status: DC | PRN
Start: 2020-06-06 — End: 2020-06-06
  Administered 2020-06-06: 50 ug via INTRAVENOUS

## 2020-06-06 MED ORDER — ONDANSETRON HCL 4 MG/2ML IJ SOLN
INTRAMUSCULAR | Status: AC
  Filled 2020-06-06: qty 2

## 2020-06-06 MED ORDER — CHLORHEXIDINE GLUCONATE 0.12 % MT SOLN
15.0000 mL | Freq: Once | OROMUCOSAL | Status: AC
  Administered 2020-06-06: 15 mL via OROMUCOSAL
  Filled 2020-06-06: qty 15

## 2020-06-06 MED ORDER — FENTANYL CITRATE (PF) 100 MCG/2ML IJ SOLN
100.0000 ug | Freq: Once | INTRAMUSCULAR | Status: DC
Start: 2020-06-06 — End: 2020-06-06

## 2020-06-06 MED ORDER — CEFAZOLIN SODIUM 1 G IJ SOLR
INTRAMUSCULAR | Status: AC
  Filled 2020-06-06: qty 10

## 2020-06-06 MED ORDER — EPINEPHRINE PF 1 MG/ML IJ SOLN
INTRAMUSCULAR | Status: DC | PRN
  Administered 2020-06-06: 1 mg

## 2020-06-06 MED ORDER — METHOCARBAMOL 500 MG PO TABS: 500.0000 mg | ORAL_TABLET | Freq: Three times a day (TID) | ORAL | 0 refills | Status: DC | PRN

## 2020-06-06 MED ORDER — LIDOCAINE 2% (20 MG/ML) 5 ML SYRINGE
INTRAMUSCULAR | Status: DC | PRN
  Administered 2020-06-06: 40 mg via INTRAVENOUS

## 2020-06-06 MED ORDER — POVIDONE-IODINE 7.5 % EX SOLN
Freq: Once | CUTANEOUS | Status: DC
  Filled 2020-06-06: qty 118

## 2020-06-06 MED ORDER — VANCOMYCIN HCL 500 MG IV SOLR
INTRAVENOUS | Status: AC
  Filled 2020-06-06: qty 500

## 2020-06-06 MED ORDER — ESMOLOL HCL 100 MG/10ML IV SOLN
INTRAVENOUS | Status: AC
  Filled 2020-06-06: qty 10

## 2020-06-06 MED ORDER — MIDAZOLAM HCL 2 MG/2ML IJ SOLN
2.0000 mg | Freq: Once | INTRAMUSCULAR | Status: AC
  Administered 2020-06-06: 2 mg via INTRAVENOUS

## 2020-06-06 MED ORDER — 0.9 % SODIUM CHLORIDE (POUR BTL) OPTIME
TOPICAL | Status: DC | PRN
  Administered 2020-06-06: 1000 mL

## 2020-06-06 MED ORDER — MIDAZOLAM HCL 2 MG/2ML IJ SOLN
INTRAMUSCULAR | Status: AC
  Filled 2020-06-06: qty 2

## 2020-06-06 MED ORDER — OXYCODONE-ACETAMINOPHEN 5-325 MG PO TABS
1.0000 | ORAL_TABLET | ORAL | 0 refills | Status: DC | PRN
Start: 2020-06-06 — End: 2020-06-10

## 2020-06-06 MED ORDER — POVIDONE-IODINE 10 % EX SWAB: 2.0000 "application " | Freq: Once | CUTANEOUS | Status: DC

## 2020-06-06 MED ORDER — PROPOFOL 10 MG/ML IV BOLUS
INTRAVENOUS | Status: DC | PRN
  Administered 2020-06-06: 160 mg via INTRAVENOUS

## 2020-06-06 MED ORDER — SODIUM CHLORIDE 0.9 % IR SOLN
Status: DC | PRN
  Administered 2020-06-06: 2000 mL

## 2020-06-06 MED ORDER — LACTATED RINGERS IV SOLN: INTRAVENOUS | Status: DC

## 2020-06-06 MED ORDER — ORAL CARE MOUTH RINSE: 15.0000 mL | Freq: Once | OROMUCOSAL | Status: AC

## 2020-06-06 MED ORDER — EPINEPHRINE PF 1 MG/ML IJ SOLN
INTRAMUSCULAR | Status: AC
  Filled 2020-06-06: qty 1

## 2020-06-06 SURGICAL SUPPLY — 75 items
ANCH SUT 2 19.1 W/TIGERTAPE (Anchor) ×1 IMPLANT
ANCH SUT 2 SWLK 19.1 CLS EYLT (Anchor) ×1 IMPLANT
ANCH SUT FBRTK 1.3 2 TPE (Anchor) ×2 IMPLANT
ANCHOR FBRTK 2.6 SUTURETAP 1.3 (Anchor) ×2 IMPLANT
ANCHOR SL BIO 4.75 W/TIGERTAPE (Anchor) ×1 IMPLANT
ANCHOR SUT 1.8 FBRTK KNTLS 2SU (Anchor) ×2 IMPLANT
ANCHOR SWIVELOCK BIO 4.75X19.1 (Anchor) ×1 IMPLANT
BIT DRILL NEURO 2X3.1 SFT TUCH (MISCELLANEOUS) IMPLANT
BLADE CUTTER GATOR 3.5 (BLADE) IMPLANT
BLADE EXCALIBUR 4.0X13 (MISCELLANEOUS) ×1 IMPLANT
BLADE SURG 10 STRL SS (BLADE) ×1 IMPLANT
BLADE SURG 11 STRL SS (BLADE) ×1 IMPLANT
BLADE SURG 15 STRL LF DISP TIS (BLADE) IMPLANT
BLADE SURG 15 STRL SS (BLADE) ×6
BUR OVAL 6.0 (BURR) IMPLANT
BURR OVAL 8 FLU 4.0X13 (MISCELLANEOUS) ×1 IMPLANT
COVER WAND RF STERILE (DRAPES) ×1 IMPLANT
DRAPE INCISE IOBAN 66X45 STRL (DRAPES) ×3 IMPLANT
DRAPE STERI 35X30 U-POUCH (DRAPES) ×2 IMPLANT
DRAPE U-SHAPE 47X51 STRL (DRAPES) ×6 IMPLANT
DRILL NEURO 2X3.1 SOFT TOUCH (MISCELLANEOUS) ×2
DRSG TEGADERM 4X4.75 (GAUZE/BANDAGES/DRESSINGS) ×5 IMPLANT
DURAPREP 26ML APPLICATOR (WOUND CARE) ×2 IMPLANT
DW OUTFLOW CASSETTE/TUBE SET (MISCELLANEOUS) ×1 IMPLANT
ELECT REM PT RETURN 9FT ADLT (ELECTROSURGICAL) ×2
ELECTRODE REM PT RTRN 9FT ADLT (ELECTROSURGICAL) ×1 IMPLANT
GAUZE SPONGE 4X4 12PLY STRL (GAUZE/BANDAGES/DRESSINGS) ×2 IMPLANT
GAUZE SPONGE 4X4 12PLY STRL LF (GAUZE/BANDAGES/DRESSINGS) ×1 IMPLANT
GAUZE XEROFORM 1X8 LF (GAUZE/BANDAGES/DRESSINGS) ×2 IMPLANT
GLOVE BIOGEL PI IND STRL 8 (GLOVE) ×1 IMPLANT
GLOVE BIOGEL PI INDICATOR 8 (GLOVE) ×1
GLOVE ECLIPSE 7.0 STRL STRAW (GLOVE) ×2 IMPLANT
GLOVE ECLIPSE 8.0 STRL XLNG CF (GLOVE) ×2 IMPLANT
GLOVE INDICATOR 7.5 STRL GRN (GLOVE) ×2 IMPLANT
GOWN STRL REUS W/ TWL LRG LVL3 (GOWN DISPOSABLE) ×3 IMPLANT
GOWN STRL REUS W/TWL LRG LVL3 (GOWN DISPOSABLE) ×6
KIT BASIN OR (CUSTOM PROCEDURE TRAY) ×2 IMPLANT
KIT STR SPEAR 1.8 FBRTK DISP (KITS) ×1 IMPLANT
KIT TURNOVER KIT B (KITS) ×2 IMPLANT
MANIFOLD NEPTUNE II (INSTRUMENTS) ×2 IMPLANT
NDL HYPO 25X1 1.5 SAFETY (NEEDLE) IMPLANT
NDL SCORPION MULTI FIRE (NEEDLE) IMPLANT
NDL SPNL 18GX3.5 QUINCKE PK (NEEDLE) ×1 IMPLANT
NDL SUT 6 .5 CRC .975X.05 MAYO (NEEDLE) ×1 IMPLANT
NEEDLE HYPO 25X1 1.5 SAFETY (NEEDLE) IMPLANT
NEEDLE MAYO TAPER (NEEDLE)
NEEDLE SCORPION MULTI FIRE (NEEDLE) ×2 IMPLANT
NEEDLE SPNL 18GX3.5 QUINCKE PK (NEEDLE) ×2 IMPLANT
NS IRRIG 1000ML POUR BTL (IV SOLUTION) ×2 IMPLANT
PACK SHOULDER (CUSTOM PROCEDURE TRAY) ×2 IMPLANT
PAD ARMBOARD 7.5X6 YLW CONV (MISCELLANEOUS) ×3 IMPLANT
PORT APPOLLO RF 90DEGREE MULTI (SURGICAL WAND) IMPLANT
PROBE APOLLO 90XL (SURGICAL WAND) ×1 IMPLANT
SLING ARM IMMOBILIZER LRG (SOFTGOODS) ×1 IMPLANT
SPONGE LAP 18X18 X RAY DECT (DISPOSABLE) ×1 IMPLANT
SPONGE LAP 4X18 RFD (DISPOSABLE) ×2 IMPLANT
STRIP CLOSURE SKIN 1/2X4 (GAUZE/BANDAGES/DRESSINGS) ×1 IMPLANT
SUCTION FRAZIER HANDLE 10FR (MISCELLANEOUS) ×2
SUCTION TUBE FRAZIER 10FR DISP (MISCELLANEOUS) IMPLANT
SUT ETHILON 3 0 PS 1 (SUTURE) ×2 IMPLANT
SUT FIBERWIRE 2-0 18 17.9 3/8 (SUTURE)
SUT MNCRL AB 3-0 PS2 27 (SUTURE) ×1 IMPLANT
SUT VIC AB 0 CT1 27 (SUTURE) ×2
SUT VIC AB 0 CT1 27XBRD ANBCTR (SUTURE) ×1 IMPLANT
SUT VIC AB 1 CT1 27 (SUTURE) ×4
SUT VIC AB 1 CT1 27XBRD ANBCTR (SUTURE) IMPLANT
SUT VIC AB 2-0 CT1 27 (SUTURE) ×2
SUT VIC AB 2-0 CT1 TAPERPNT 27 (SUTURE) ×1 IMPLANT
SUT VICRYL 0 UR6 27IN ABS (SUTURE) ×9 IMPLANT
SUTURE FIBERWR 2-0 18 17.9 3/8 (SUTURE) IMPLANT
SYR 20ML LL LF (SYRINGE) ×1 IMPLANT
SYR 3ML LL SCALE MARK (SYRINGE) ×1 IMPLANT
SYR TB 1ML LUER SLIP (SYRINGE) ×1 IMPLANT
TOWEL GREEN STERILE (TOWEL DISPOSABLE) ×2 IMPLANT
TUBING ARTHROSCOPY IRRIG 16FT (MISCELLANEOUS) ×2 IMPLANT

## 2020-06-06 NOTE — H&P (Signed)
Christine Barrett is an 59 y.o. female.   Chief Complaint: Right shoulder pain HPI: Patient presents with several month history of right shoulder pain.  Developed right shoulder pain earlier this year and underwent subacromial injection which did not give much relief.  Noted to have some bursitis as well as intact rotator cuff on MRI scan.  Underwent intra-articular injection x2 with good relief but it was short-term.  Has had recurrent pain and stiffness.  Presents now for operative management of fractionation risk and benefits.  Rotator cuff strength is good on exam.  Past Medical History:  Diagnosis Date  . Allergy    SEASONAL  . Anemia   . CAD S/P percutaneous coronary angioplasty 09/2013   a) Ostial AV G Cx - 2.5 mm Angiosculpt; mid LAD 40-60%; b) Myoview 07/2014: LOW RISK, small-severe fixed inferior defect c/w infarct w/o peri-infarct ischemia.  . Chronic bronchitis ([place])    "frequently; not q yr" (10/03/2013)  . Diabetes mellitus without complication ([place])    Type 2-diet controlled.   . Diverticulosis   . Essential hypertension    with prior Accelerated HTN  . GERD (gastroesophageal reflux disease)   . Hiatal hernia   . Hx of non-ST elevation myocardial infarction (NSTEMI) 10/01/2013   Due to Accelerated HTN with existing CAD  . Hyperlipemia   . Migraine    "@ least once/month" (10/03/2013)  . OSA (obstructive sleep apnea) 06/10/2016  . Schatzki's ring   . Seasonal allergies   . Sinusitis   . Spinal headache    during C-section of second child.     Past Surgical History:  Procedure Laterality Date  . ABDOMINAL HYSTERECTOMY  1994   "partial"  . CARDIAC CATHETERIZATION N/A 08/05/2015   Procedure: Left Heart Cath and Coronary Angiography;  Surgeon: [name], MD;  Location: [place] CV LAB;  Service: Cardiovascular;  Laterality: N/A;  . CESAREAN SECTION  1989  . COLONOSCOPY    . CORONARY ANGIOPLASTY  10/03/2013   95% ostial AV G Cx - 2.5 mm AngioSculpt Balloon PTCA; mid LAD  40-60%  . LEFT HEART CATHETERIZATION WITH CORONARY ANGIOGRAM N/A 10/02/2013   Procedure: LEFT HEART CATHETERIZATION WITH CORONARY ANGIOGRAM;  Surgeon: [name], MD;  Location: [facility] CATH LAB;  Service: Cardiovascular;  Laterality: N/A;  . NASAL SEPTOPLASTY W/ TURBINOPLASTY  ~ 2007  . NM MYOVIEW LTD  08/22/2014    Low risk stress nuclear study with a small, severe, fixed defect in the distal inferior wall/apex suggestive of small prior infarct; no ischemia.  LV Wall Motion: NL LV Function; NL Wall Motion  . PERCUTANEOUS CORONARY STENT INTERVENTION (PCI-S) N/A 10/03/2013   cutting balloon angioplasty only no stent.   . TRANSTHORACIC ECHOCARDIOGRAM  10/02/2013   EF 55-60%; mild LVH, no RWMA,   . TUBAL LIGATION  1989    Family History  Adopted: Yes  Problem Relation Age of Onset  . Diabetes Mother   . Breast cancer Sister   . Hypertension Sister   . Colon cancer Neg Hx   . Heart attack Neg Hx   . Stroke Neg Hx    Social History:  reports that she has never smoked. She has never used smokeless tobacco. She reports current alcohol use. She reports that she does not use drugs.  Allergies:  Allergies  Allergen Reactions  . Penicillins Rash    Pt states she has taken since intial  reaction without recurrence.  Has patient had a PCN reaction causing immediate rash, facial/tongue/throat  swelling, SOB or lightheadedness with hypotension:  no Has patient had a PCN reaction causing severe rash involving mucus membranes or skin necrosis: no Has patient had a PCN reaction that required hospitalization no Has patient had a PCN reaction occurring within the last 10 years: no If all of the above answers are "NO", then may proceed with Cephalospori    Medications Prior to Admission  Medication Sig Dispense Refill  . aspirin EC 81 MG EC tablet Take 1 tablet (81 mg total) by mouth daily.    [name] BLACK ELDERBERRY PO Take 2 each by mouth daily.    . carvedilol (COREG) 25 MG tablet Take 1 tablet (25 mg  total) by mouth 2 (two) times daily with a meal. 180 tablet 3  . ezetimibe (ZETIA) 10 MG tablet Take 1 tablet (10 mg total) by mouth daily. 90 tablet 3  . famotidine (PEPCID) 20 MG tablet Take 20 mg by mouth daily.     . hydrochlorothiazide (HYDRODIURIL) 25 MG tablet Take 1 tablet (25 mg total) by mouth daily. 90 tablet 0  . isosorbide mononitrate (IMDUR) 60 MG 24 hr tablet Take 2 tablets (120 mg total) by mouth daily. 180 tablet 3  . latanoprost (XALATAN) 0.005 % ophthalmic solution Place 1 drop into both eyes at bedtime.     [name] levocetirizine (XYZAL) 5 MG tablet Take 5 mg by mouth daily.    [name] losartan (COZAAR) 100 MG tablet Take 1 tablet by mouth once daily 90 tablet 3  . montelukast (SINGULAIR) 10 MG tablet TAKE 1 TABLET BY MOUTH AT BEDTIME (Patient taking differently: Take 10 mg by mouth at bedtime.) 90 tablet 3  . Multiple Vitamin (MULTIVITAMIN WITH MINERALS) TABS tablet Take 1 tablet by mouth daily.    . Multiple Vitamins-Minerals (EMERGEN-C IMMUNE) PACK Take 1 packet by mouth daily.    . pantoprazole (PROTONIX) 40 MG tablet Take 1 tablet by mouth once daily (Patient taking differently: Take 40 mg by mouth daily.) 90 tablet 3  . acetaminophen-codeine (TYLENOL #3) 300-30 MG tablet Take 1 tablet by mouth every 8 (eight) hours as needed for moderate pain. (Patient not taking: Reported on 05/31/2020) 35 tablet 0  . albuterol (PROVENTIL HFA;VENTOLIN HFA) 108 (90 Base) MCG/ACT inhaler INHALE 2 PUFFS BY MOUTH EVERY 6 HOURS AS NEEDED FOR WHEEZING OR  SHORTNESS  OF  BREATH (Patient taking differently: Inhale 2 puffs into the lungs every 6 (six) hours as needed for wheezing or shortness of breath.) 18 each 0  . atorvastatin (LIPITOR) 80 MG tablet Take 1 tablet (80 mg total) by mouth daily. (Patient not taking: Reported on 05/31/2020) 90 tablet 3  . clotrimazole (CLOTRIMAZOLE ANTI-FUNGAL) 1 % cream Apply 1 application topically 2 (two) times daily. (Patient not taking: Reported on 05/31/2020) 30 g 1  .  fluticasone (FLONASE) 50 MCG/ACT nasal spray Place 2 sprays into both nostrils daily. (Patient taking differently: Place 2 sprays into both nostrils daily as needed for allergies.) 16 g 0  . Misc Natural Products (APPLE CIDER VINEGAR DIET PO) Take 2 each by mouth daily.      No results found for this or any previous visit (from the past 48 hour(s)). No results found.  Review of Systems  Musculoskeletal: Positive for arthralgias.  All other systems reviewed and are negative.   Blood pressure 138/72, pulse 80, temperature 98.4 F (36.9 C), temperature source Oral, resp. rate 18, height 5\' 9"  (1.753 m), weight 131.2 kg, SpO2 98 %. Physical Exam Vitals reviewed.  HENT:  Head: Normocephalic.     Nose: Nose normal.     Mouth/Throat:     Mouth: Mucous membranes are moist.  Eyes:     Pupils: Pupils are equal, round, and reactive to light.  Cardiovascular:     Rate and Rhythm: Normal rate.     Pulses: Normal pulses.  Pulmonary:     Effort: Pulmonary effort is normal.  Abdominal:     General: Abdomen is flat.  Musculoskeletal:     Cervical back: Normal range of motion.  Skin:    General: Skin is warm.     Capillary Refill: Capillary refill takes less than 2 seconds.  Neurological:     General: No focal deficit present.     Mental Status: She is alert.  Psychiatric:        Mood and Affect: Mood normal.   .  Right shoulder exam demonstrates good rotator cuff strength infraspinatus supraspinatus and subscap muscle testing.  No discrete AC joint tenderness is present.  Patient has restriction of external rotation on the right versus left of about 15 to 20 degrees.  Also limitation of forward flexion by about the same amount right versus left.  Motor sensory function of the hand is intact..  Assessment/Plan Impression is right shoulder pain with reasonable looking rotator cuff tear on exam.  MRI scan shows relative contraction of the axillary recess.  She is failed conservative  measures including 2 intra-articular injections which did give her good but not sustained relief.  Plan at this time is examination under anesthesia with manipulation and possible rotator interval release.  Plan to use CPM machine after surgery.  AC joint not particularly tender and the labrum appears to be intact on my review of the nonarthrogram MRI scan.  Plan to use CPM machine after surgery.  All questions answered  Anderson Malta, MD 06/06/2020, 3:52 PM

## 2020-06-06 NOTE — Transfer of Care (Signed)
Immediate Anesthesia Transfer of Care Note  Patient: Christine Barrett  Procedure(s) Performed: RIGHT SHOULDER MANIPULATION UNDER ANESTHESIA, ROTATOR CUFF REPAIR (Right Shoulder)  Patient Location: PACU  Anesthesia Type:General  Level of Consciousness: drowsy  Airway & Oxygen Therapy: Patient Spontanous Breathing and Patient connected to face mask oxygen  Post-op Assessment: Report given to RN and Post -op Vital signs reviewed and stable  Post vital signs: Reviewed and stable  Last Vitals:  Vitals Value Taken Time  BP 193/106 06/06/20 1933  Temp    Pulse 87 06/06/20 1935  Resp 17 06/06/20 1935  SpO2 93 % 06/06/20 1935  Vitals shown include unvalidated device data.  Last Pain:  Vitals:   06/06/20 1630  TempSrc:   PainSc: 0-No pain      Patients Stated Pain Goal: 3 (79/72/82 0601)  Complications: No complications documented.

## 2020-06-06 NOTE — OR Nursing (Signed)
Care of patient assumed at 1905. 

## 2020-06-06 NOTE — Anesthesia Procedure Notes (Signed)
Procedure Name: Intubation Performed by: Cleda Daub, CRNA Pre-anesthesia Checklist: Patient identified, Emergency Drugs available, Suction available and Patient being monitored Patient Re-evaluated:Patient Re-evaluated prior to induction Oxygen Delivery Method: Circle system utilized Preoxygenation: Pre-oxygenation with 100% oxygen Induction Type: IV induction Ventilation: Mask ventilation without difficulty Laryngoscope Size: Glidescope and 4 Grade View: Grade I Tube type: Oral Tube size: 7.5 mm Number of attempts: 1 Airway Equipment and Method: Stylet and Oral airway Placement Confirmation: ETT inserted through vocal cords under direct vision,  positive ETCO2 and breath sounds checked- equal and bilateral Secured at: 22 cm Tube secured with: Tape Dental Injury: Teeth and Oropharynx as per pre-operative assessment

## 2020-06-06 NOTE — Anesthesia Procedure Notes (Addendum)
Anesthesia Regional Block: Interscalene brachial plexus block   Pre-Anesthetic Checklist: ,, timeout performed, Correct Patient, Correct Site, Correct Laterality, Correct Procedure, Correct Position, site marked, Risks and benefits discussed,  Surgical consent,  Pre-op evaluation,  At surgeon's request and post-op pain management  Laterality: Right  Prep: chloraprep       Needles:  Injection technique: Single-shot  Needle Type: Stimulator Needle - 40          Additional Needles:   Procedures: Doppler guided, nerve stimulator,,, ultrasound used (permanent image in chart),,,,   Nerve Stimulator or Paresthesia:  Response: 0.5 mA,   Additional Responses:   Narrative:  Start time: 06/06/2020 4:00 PM End time: 06/06/2020 4:25 PM Injection made incrementally with aspirations every 5 mL.  Performed by: Personally  Anesthesiologist: Belinda Block, MD

## 2020-06-06 NOTE — Telephone Encounter (Signed)
FYI

## 2020-06-06 NOTE — OR Nursing (Signed)
Husband notified of patient status via Museum/gallery exhibitions officer.

## 2020-06-06 NOTE — Anesthesia Postprocedure Evaluation (Signed)
Anesthesia Post Note  Patient: Onna Dancy  Procedure(s) Performed: RIGHT SHOULDER MANIPULATION UNDER ANESTHESIA, ROTATOR CUFF REPAIR (Right Shoulder)     Patient location during evaluation: PACU Anesthesia Type: General Level of consciousness: awake and alert Pain management: pain level controlled Vital Signs Assessment: post-procedure vital signs reviewed and stable Respiratory status: spontaneous breathing, nonlabored ventilation, respiratory function stable and patient connected to nasal cannula oxygen Cardiovascular status: blood pressure returned to baseline and stable Postop Assessment: no apparent nausea or vomiting Anesthetic complications: no   No complications documented.  Last Vitals:  Vitals:   06/06/20 2030 06/06/20 2040  BP: (!) 171/88 (!) 164/82  Pulse: 88 88  Resp: 16 19  Temp:  36.5 C  SpO2: 100% 100%    Last Pain:  Vitals:   06/06/20 2030  TempSrc:   PainSc: 0-No pain                 Audry Pili

## 2020-06-06 NOTE — Telephone Encounter (Signed)
Great thx

## 2020-06-06 NOTE — Telephone Encounter (Signed)
Per Ruby Cola, she is being set up this morning

## 2020-06-06 NOTE — Brief Op Note (Signed)
   06/06/2020  7:11 PM  PATIENT:  Christine Barrett  59 y.o. female  PRE-OPERATIVE DIAGNOSIS:  right shoulder adhesive capsulitis  POST-OPERATIVE DIAGNOSIS:  right shoulder early adhesive capsulitis, posterior superior labral tear and rotator cuff tear supraspinatus 1 x 2 cm  PROCEDURE:  Procedure(s): RIGHT SHOULDER MANIPULATION UNDER ANESTHESIA, ROTATOR CUFF REPAIR  SURGEON:  Surgeon(s): [name], Tonna Corner, MD  ASSISTANT: Annie Main, PA  ANESTHESIA:   general  EBL: 25 ml    Total I/O In: 1000 [I.V.:1000] Out: 50 [Blood:50]  BLOOD ADMINISTERED: none  DRAINS: none   LOCAL MEDICATIONS USED: Vancomycin  SPECIMEN:  No Specimen  COUNTS:  YES  TOURNIQUET:  * No tourniquets in log *  DICTATION: .Other Dictation: Dictation Number [id number]  PLAN OF CARE: Discharge to home after PACU  PATIENT DISPOSITION:  PACU - hemodynamically stable

## 2020-06-07 ENCOUNTER — Other Ambulatory Visit: Payer: Self-pay | Admitting: Surgical

## 2020-06-07 ENCOUNTER — Encounter (HOSPITAL_COMMUNITY): Payer: Self-pay | Admitting: Orthopedic Surgery

## 2020-06-07 ENCOUNTER — Telehealth: Payer: Self-pay | Admitting: Orthopedic Surgery

## 2020-06-07 MED ORDER — ONDANSETRON HCL 4 MG PO TABS: 4.0000 mg | ORAL_TABLET | Freq: Three times a day (TID) | ORAL | 0 refills | Status: DC | PRN

## 2020-06-07 NOTE — Telephone Encounter (Signed)
Pt daughter called Pt has been vomiting and is in a lot of pain. Please call her daughter at [phone].

## 2020-06-07 NOTE — Telephone Encounter (Signed)
Please advise 

## 2020-06-07 NOTE — Telephone Encounter (Signed)
Sent in RX for zofran

## 2020-06-07 NOTE — Op Note (Signed)
NAMEADIVA, BOETTNER MEDICAL RECORD EX:[id number] ACCOUNT [id number] DATE OF BIRTH:May 30, 1961 FACILITY: [facility] LOCATION: [facility] PHYSICIAN:Marna Weniger Randel Pigg, MD  OPERATIVE REPORT  DATE OF PROCEDURE:  06/06/2020  PREOPERATIVE DIAGNOSIS:  Right shoulder early adhesive capsulitis.  POSTOPERATIVE DIAGNOSIS:  Right shoulder early adhesive capsulitis with posterior superior labral tear and rotator cuff tear of the supraspinatus measuring 1 x 2 cm with mild retraction, degenerative in nature.  PROCEDURE:  Right shoulder examination under anesthesia with arthroscopy, biceps tendon release, limited debridement of superior labrum, mini open biceps tenodesis and rotator cuff tear repair.  SURGEON:  Meredith Pel, MD  ASSISTANT:  Annie Main, PA  INDICATIONS:  The patient is a 59 year old patient with significant right shoulder pain who presents for operative management after explanation of risks and benefits.  MRI scanning from 6 months ago was done.  Arthrogram demonstrated partial thickness  tearing of the supraspinatus with posterior superior labral tear and thickening of the inferior capsule.  She has had subsequent 1 subacromial and 2 intra-articular injections, which helped her pain, but she has recurrent pain and presents now for  operative management after explanation of risks and benefits.  PROCEDURE IN DETAIL:  The patient was brought to the operating room where general anesthetic was induced.  Preoperative antibiotics administered.  Timeout was called.  Both arms were examined under anesthesia and the patient had fairly symmetric passive  range of motion with 170 forward flexion, external rotation at 15 degrees, abduction was about 70 degrees bilaterally and isolated glenohumeral abduction under anesthesia was about 105.  Following this, the patient was placed in the beach chair position  with her head in neutral position.  Right arm, shoulder and hand prescrubbed with alcohol and  Betadine, allowed to air dry, prepped with DuraPrep solution and draped in sterile manner.  Ioban used to seal the operative field.  Timeout was called.   Posterior portal was created 2 cm medial and inferior to the posterolateral margin of acromion.  Diagnostic arthroscopy was performed.  Anterior portal created under direct visualization.  There was some early synovitis within the superior capsular  region, which was coagulated.  This was done with the Arthrocare wand.  The rotator interval itself had no significant synovitis.  There was rotator cuff tear involving the majority of the supraspinatus footprint.  Measured about 2 cm across.  Measured  about 1 cm medial to lateral.  This tear was also debrided.  Biceps tendon was released.  Superior labrum was debrided.  Anterior, inferior, posterior inferior glenohumeral ligaments were intact and the joint surfaces were intact as well.  Following  this, the instruments were removed.  Portals were closed using 3-0 nylon.  Ioban then used to cover the entire operative field.  An incision was made off the anterolateral margin of the acromion.  Skin and subcutaneous tissue were sharply divided.  Next,  the portals were closed.  Incision was made.  Skin and subcutaneous tissue were sharply divided.  Deltoid was split between the middle and anterior raphae, a distance of 4 cm measured and that split was marked with a #1 Vicryl suture.  Bursectomy  performed.  A self-retaining retractor placed.  Transverse humeral ligament opened and the biceps tendon was tenodesed using 2 Arthrex knotless 1.8 mm suture anchors.  Secured fixation was achieved under appropriate tension.  This was done using 2  anchors.  Reinforced with 0 Vicryl suture x3.  Next, attention was directed towards the rotator cuff tear.  Supraspinatus had a degenerative type  tear at its leading edge right underneath the spur of the acromion.  Using a bur, the acromioplasty was  performed.  Next, the tear  was evaluated and found to have significant degenerative tissue anterior to posterior at distance of about 1.5-2 cm.  Degenerative rotator cuff tissue was debrided.  The resulting tear was about 2 x 1 cm.  Footprint was 2 cm  long and about a centimeter deep.  Footprint was prepared.  Good mobility of the tendon was present.  The footprint was prepared using curetting, rongeur and transverse manipulations with 15 blade.  This created a nice bleeding bony surface.  Two Arthrex  SutureTape knotless suture anchors were placed, which had 4 SutureTapes attached.  Four 0 Vicryl sutures were placed in modified Mason-Allen fashion along the leading edge of the tear.  SutureTapes were then passed in equidistant fashion medial to the  original Vicryl sutures.  Then, they were tied down under appropriate tension, crossed and then the anterior 4 SutureTapes were matched with the 4 limbs of the Vicryl suture and in a similar manner, the posterior 4 SutureTapes matched with the posterior  Vicryl sutures.  They were then placed into 2 SwiveLocks from Arthrex 5.5 mm and then placed into the metaphysis with a watertight repair achieved.  The patient's arm was taken through a range of motion and found to have a good range of motion with no  gapping of the repair site.  Thorough irrigation was performed.  Vancomycin powder placed.  Deltoid split closed using #1 Vicryl suture followed by interrupted inverted 0 Vicryl suture, 2-0 Vicryl suture and a 3-0 Monocryl.  Steri-Strips and impervious  dressings applied.  The patient tolerated the procedure well without immediate complications.  He was transferred to the recovery room in stable condition.  He was placed into a shoulder sling.  We will begin passive range of motion tomorrow morning.   Luke's assistance was required for limb positioning, opening and closing.  His assistance was a medical necessity.  HN/NUANCE  D:06/06/2020 T:06/07/2020 JOB:[id number]

## 2020-06-10 ENCOUNTER — Other Ambulatory Visit: Payer: Self-pay | Admitting: Surgical

## 2020-06-10 ENCOUNTER — Telehealth: Payer: Self-pay

## 2020-06-10 MED ORDER — OXYCODONE-ACETAMINOPHEN 5-325 MG PO TABS: 1.0000 | ORAL_TABLET | Freq: Four times a day (QID) | ORAL | 0 refills | Status: DC | PRN

## 2020-06-10 NOTE — Telephone Encounter (Signed)
Patient called she is requesting rx refill for oxycodone. CB:[phone]

## 2020-06-10 NOTE — Telephone Encounter (Signed)
Please advise. Thanks.  

## 2020-06-10 NOTE — Telephone Encounter (Signed)
submitted

## 2020-06-13 ENCOUNTER — Other Ambulatory Visit: Payer: Self-pay | Admitting: Family Medicine

## 2020-06-13 DIAGNOSIS — I10 Essential (primary) hypertension: Secondary | ICD-10-CM

## 2020-06-13 MED ORDER — HYDROCHLOROTHIAZIDE 25 MG PO TABS: 25.0000 mg | ORAL_TABLET | Freq: Every day | ORAL | 3 refills | Status: DC

## 2020-06-13 MED ORDER — CARVEDILOL 25 MG PO TABS: ORAL_TABLET | ORAL | 3 refills | Status: DC

## 2020-06-13 NOTE — Addendum Note (Signed)
Addended by: Pilar Grammes on: 06/13/2020 11:14 AM   Modules accepted: Orders

## 2020-06-14 ENCOUNTER — Other Ambulatory Visit: Payer: Self-pay

## 2020-06-14 ENCOUNTER — Ambulatory Visit (INDEPENDENT_AMBULATORY_CARE_PROVIDER_SITE_OTHER): Payer: 59 | Admitting: Orthopedic Surgery

## 2020-06-14 DIAGNOSIS — M7501 Adhesive capsulitis of right shoulder: Secondary | ICD-10-CM

## 2020-06-16 ENCOUNTER — Encounter: Payer: Self-pay | Admitting: Orthopedic Surgery

## 2020-06-16 NOTE — Progress Notes (Signed)
Post-Op Visit Note   Patient: Christine Barrett           Date of Birth: 07/03/1960           MRN: [id number] Visit Date: 06/14/2020 PCP: Jinny Sanders, MD   Assessment & Plan:  Chief Complaint:  Chief Complaint  Patient presents with   Right Shoulder - Routine Post Op   Visit Diagnoses:  1. Adhesive capsulitis of right shoulder     Plan: Christine Barrett is a 59 year old patient who is now about a week out right shoulder arthroscopy rotator cuff repair and biceps tenodesis.  On exam deltoid fires.  Incision is intact.  Sutures removed.  Continue with brace range of motion exercises to prevent shoulder stiffness.  Follow-up in 2 weeks at which time we will initiate physical therapy.  No lifting with the right arm.  Follow-Up Instructions: No follow-ups on file.   Orders:  Orders Placed This Encounter  Procedures   Ambulatory referral to Physical Therapy   No orders of the defined types were placed in this encounter.   Imaging: No results found.  PMFS History: Patient Active Problem List   Diagnosis Date Noted   Type 2 diabetes mellitus with other circulatory complications, HTN, CVD ([place]) 05/17/2020   Morbid obesity ([place]) 05/17/2020   BMI 40.0-44.9, adult ([place]) 05/17/2020   OSA (obstructive sleep apnea) 06/10/2016   Costochondritis 06/09/2016   Bilateral leg cramps 02/07/2016   Peripheral edema 01/29/2016   Essential hypertension    GERD (gastroesophageal reflux disease) 08/10/2014   Menopausal syndrome 08/10/2014   CAD S/P percutaneous coronary angioplasty - Ostial AVG Cx - Scoring balloon PTCA 10/03/2013   Hyperlipidemia with target LDL less than 70 10/02/2013   H/O non-ST elevation myocardial infarction (NSTEMI) 10/02/2013   Normocytic anemia, not due to blood loss    Allergic rhinitis 03/17/2007   MIGRAINE, COMMON W/O INTRACTABLE MIGRAINE 03/16/2007   Past Medical History:  Diagnosis Date   Allergy    SEASONAL   Anemia    CAD S/P percutaneous  coronary angioplasty 09/2013   a) Ostial AV G Cx - 2.5 mm Angiosculpt; mid LAD 40-60%; b) Myoview 07/2014: LOW RISK, small-severe fixed inferior defect c/w infarct w/o peri-infarct ischemia.   Chronic bronchitis ([place])    "frequently; not q yr" (10/03/2013)   Diabetes mellitus without complication (HCC)    Type 2-diet controlled.    Diverticulosis    Essential hypertension    with prior Accelerated HTN   GERD (gastroesophageal reflux disease)    Hiatal hernia    Hx of non-ST elevation myocardial infarction (NSTEMI) 10/01/2013   Due to Accelerated HTN with existing CAD   Hyperlipemia    Migraine    "@ least once/month" (10/03/2013)   OSA (obstructive sleep apnea) 06/10/2016   Schatzki's ring    Seasonal allergies    Sinusitis    Spinal headache    during C-section of second child.     Family History  Adopted: Yes  Problem Relation Age of Onset   Diabetes Mother    Breast cancer Sister    Hypertension Sister    Colon cancer Neg Hx    Heart attack Neg Hx    Stroke Neg Hx     Past Surgical History:  Procedure Laterality Date   ABDOMINAL HYSTERECTOMY  1994   "partial"   CAPSULAR RELEASE Right 06/06/2020   Procedure: RIGHT SHOULDER MANIPULATION UNDER ANESTHESIA, ROTATOR CUFF REPAIR;  Surgeon: Meredith Pel, MD;  Location: [place];  Service: Orthopedics;  Laterality: Right;   CARDIAC CATHETERIZATION N/A 08/05/2015   Procedure: Left Heart Cath and Coronary Angiography;  Surgeon: [name], MD;  Location: [place] [facility];  Service: Cardiovascular;  Laterality: N/A;   CESAREAN SECTION  1989   COLONOSCOPY     CORONARY ANGIOPLASTY  10/03/2013   95% ostial AV G Cx - 2.5 mm AngioSculpt Balloon PTCA; mid LAD 40-60%   LEFT HEART CATHETERIZATION WITH CORONARY ANGIOGRAM N/A 10/02/2013   Procedure: LEFT HEART CATHETERIZATION WITH CORONARY ANGIOGRAM;  Surgeon: [name], MD;  Location[facility] CATH [facility];  Service: Cardiovascular;  Laterality: N/A;   NASAL  SEPTOPLASTY W/ TURBINOPLASTY  ~ 2007   NM MYOVIEW LTD  08/22/2014    Low risk stress nuclear study with a small, severe, fixed defect in the distal inferior wall/apex suggestive of small prior infarct; no ischemia.  LV Wall Motion: NL LV Function; NL Wall Motion   PERCUTANEOUS CORONARY STENT INTERVENTION (PCI-S) N/A 10/03/2013   cutting balloon angioplasty only no stent.    TRANSTHORACIC ECHOCARDIOGRAM  10/02/2013   EF 55-60%; mild LVH, no RWMA,    TUBAL LIGATION  1989   Social History   Occupational History   Occupation: Research scientist (life sciences)  Tobacco Use   Smoking status: Never Smoker   Smokeless tobacco: Never Used  Scientific laboratory technician Use: Never used  Substance and Sexual Activity   Alcohol use: Yes    Alcohol/week: 0.0 standard drinks    Comment: occ   Drug use: No   Sexual activity: Yes    Birth control/protection: Surgical

## 2020-06-17 ENCOUNTER — Other Ambulatory Visit: Payer: Self-pay | Admitting: Surgical

## 2020-06-17 ENCOUNTER — Telehealth: Payer: Self-pay | Admitting: Orthopedic Surgery

## 2020-06-17 MED ORDER — OXYCODONE-ACETAMINOPHEN 5-325 MG PO TABS: 1.0000 | ORAL_TABLET | Freq: Three times a day (TID) | ORAL | 0 refills | Status: DC | PRN

## 2020-06-17 NOTE — Telephone Encounter (Signed)
See below

## 2020-06-17 NOTE — Telephone Encounter (Signed)
I called patient to advise. No answer, voice mailbox if full, unable to leave message.

## 2020-06-17 NOTE — Telephone Encounter (Signed)
Patient called she would like oxycodone sent to [facility] pharmacy. Her call back number is [phone]

## 2020-06-17 NOTE — Telephone Encounter (Signed)
Sent in

## 2020-06-18 ENCOUNTER — Other Ambulatory Visit: Payer: Self-pay | Admitting: *Deleted

## 2020-06-18 MED ORDER — CLOTRIMAZOLE 1 % EX CREA: 1.0000 "application " | TOPICAL_CREAM | Freq: Two times a day (BID) | CUTANEOUS | 0 refills | Status: DC

## 2020-06-18 NOTE — Telephone Encounter (Signed)
Last office visit 05/17/2020 for CPE.  Clotrimazole cream is not on current medication list.  Ok to refill?

## 2020-06-19 ENCOUNTER — Encounter: Payer: 59 | Attending: Family Medicine | Admitting: Dietician

## 2020-06-19 ENCOUNTER — Encounter: Payer: Self-pay | Admitting: Dietician

## 2020-06-19 ENCOUNTER — Other Ambulatory Visit: Payer: Self-pay

## 2020-06-19 DIAGNOSIS — E1159 Type 2 diabetes mellitus with other circulatory complications: Secondary | ICD-10-CM | POA: Diagnosis present

## 2020-06-19 NOTE — Patient Instructions (Signed)
Work towards eating three meals a day, about 5-6 hours apart!  Begin to recognize carbohydrates in your food choices!  Have 3 carb choices at each meal (45 g).   Begin to build your meals using the proportions of the Balanced Plate.  First, select your carb choice(s) for the meal, and determine how much you should have to equal 3 carb choices (45 g).  Next, select your source of protein to pair with your carb choice(s).  Finally, complete the remaining half of your meal with a variety of non-starchy vegetables.  Choose lean meats or beans for protein!  Work towards a goal A1c of below 5.7!!!

## 2020-06-19 NOTE — Progress Notes (Signed)
Medical Nutrition Therapy   Primary concerns today: Diabetes  Referral diagnosis: 11.59 Diabetes mellitus with other circulatory concerns  Preferred learning style: No preference indicated Learning readiness: Ready   NUTRITION ASSESSMENT   Anthropometrics  Wt: 287 lbs Ht: 5'9" BMI: 42.38 kg/m2  Clinical Medical Hx: NSTEMI, coronary angiograph, HTN, Hyperlipidemia Medications: Coreg, Zetia, Pepcid, Hydrodiuril, Imdur, Cozaar Labs:  A1c - 6.8. Total Cholesterol - 206 (high) , LDL- 133 (high)  Notable Signs/Symptoms: Fatigue, SOB  Lifestyle & Dietary Hx Pt has been prediabetic for 6+ years. Pt is present with friend Freada Bergeron today, states that Fraser Din is going to be her accountability. Pt reports having a great support network of friends. Pt reports injuring her shoulder in May that required surgery. Rotator cuff surgery was done in December. Pt can't lift her arm yet, and will be starting PT tomorrow (06/20/2020) Pt reports having seasonal/airborne allergies. Pt reports being lactose intolerant.  Pt had Covid in December, had weakness and SOB for about a month. Still has SOB from time to time and it affects her physical activity.  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 Has a varying work schedule, and is on the go a decent amount. Pt typical eats 2 meals a day when working, skips breakfast. Pt recently had diabetic eye exam a month ago, and everything looks good. Pt recently had athletes foot, which has been cleared up with Lotrimin. Denies checking her feet regularly.   Estimated daily fluid intake: ~50 oz oz Supplements: Daily MV, Black Elderberry Sleep: normal Stress / self-care: Pt reports stress over health concerns, but has a great network of friends for support Current average weekly physical activity: ADLs  24-Hr Dietary Recall First Meal: 2 pancakes, 2 strips of bacon, water Snack: none Second Meal:  none Snack: none Third Meal: Grilled chicken, rice, sweet tea Snack: 2 tangelos Beverages: water, sweet tea, occasioanal soda, crystal light.    NUTRITION DIAGNOSIS  NB-1.1 Food and nutrition-related knowledge deficit As related to diabetes.  As evidenced by A1c of 6.8, dietary history of skipping meals, and long history of progressive prediabetes..   NUTRITION INTERVENTION  Nutrition education (E-1) on the following topics:   Educated patient on the pathophysiology of diabetes. This includes why our bodies need circulating blood sugar, the relationship between insulin and blood sugar, and the results of insulin resistance and/or pancreatic insufficiency on the development of diabetes. Educated patient on factors that contribute to elevation of blood sugars, such as stress, illness, injury,and food choices. Discussed the role that physical activity plays in lowering blood sugar. Educate patient on the three main macronutrients. Protein, fats, and carbohydrates. Discussed how each of these macronutrients affect blood sugar levels, especially carbohydrate, and the importance of eating a consistent amount of carbohydrate throughout the day. Educated patient on carbohydrate counting, 15g of carbohydrate equals one carb choice. Advised patient on the importance of consistently checking their blood sugar, and recognizing how lifestyle and food choices affect those numbers. Educate pt on factors that can elevate LDL cholesterol, including high dietary intake of saturated fats. Educate pt on identifying sources of saturated fats, and how to make alternative food choices to lower saturated fat intake. Educate pt on the role of soluble fiber in binding to cholesterol in the GI tract an eliminating it from the body. Educate pt on dietary sources of soluble fiber. Educate on the role of elevated LDL,total cholesterol, and triglycerides on cardiovascular health. Educate the patient on the  connection between  diabetes, hypertension, and high cholesterol on cardiovascular health.  Handouts Provided Include   Balanced Plate  Yellow meal plan card  Cardiac TLC Nutrition Therapy Nutrition Care Manual  Learning Style & Readiness for Change Teaching method utilized: Visual & Auditory  Demonstrated degree of understanding via: Teach Back  Barriers to learning/adherence to lifestyle change: None  Goals Established by Pt  Work towards eating three meals a day, about 5-6 hours apart!  Begin to recognize carbohydrates in your food choices!  Have 3 carb choices at each meal (45 g).   Begin to build your meals using the proportions of the Balanced Plate.  First, select your carb choice(s) for the meal, and determine how much you should have to equal 3 carb choices (45 g).  Next, select your source of protein to pair with your carb choice(s).  Finally, complete the remaining half of your meal with a variety of non-starchy vegetables.  Choose lean meats or beans for protein!  Work towards a goal A1c of below 5.7!!!   MONITORING & EVALUATION Dietary intake, weekly physical activity, and carbohydrate recognition in 2 months.  Next Steps  Patient is to have their latest A1c drawn, and follow up with dietitian.

## 2020-06-20 ENCOUNTER — Ambulatory Visit (INDEPENDENT_AMBULATORY_CARE_PROVIDER_SITE_OTHER): Payer: 59 | Admitting: Rehabilitative and Restorative Service Providers"

## 2020-06-20 ENCOUNTER — Telehealth: Payer: Self-pay | Admitting: Orthopedic Surgery

## 2020-06-20 ENCOUNTER — Encounter: Payer: Self-pay | Admitting: Rehabilitative and Restorative Service Providers"

## 2020-06-20 ENCOUNTER — Other Ambulatory Visit: Payer: Self-pay

## 2020-06-20 DIAGNOSIS — G8929 Other chronic pain: Secondary | ICD-10-CM

## 2020-06-20 DIAGNOSIS — M25611 Stiffness of right shoulder, not elsewhere classified: Secondary | ICD-10-CM

## 2020-06-20 DIAGNOSIS — R600 Localized edema: Secondary | ICD-10-CM

## 2020-06-20 DIAGNOSIS — M6281 Muscle weakness (generalized): Secondary | ICD-10-CM

## 2020-06-20 DIAGNOSIS — M25511 Pain in right shoulder: Secondary | ICD-10-CM

## 2020-06-20 MED ORDER — METHOCARBAMOL 500 MG PO TABS: 500.0000 mg | ORAL_TABLET | Freq: Three times a day (TID) | ORAL | 0 refills | Status: DC | PRN

## 2020-06-20 NOTE — Telephone Encounter (Signed)
Patient was here for PT. She would like a refill on her muscle relaxer's. Her call back number is [phone]

## 2020-06-20 NOTE — Telephone Encounter (Signed)
Submitted to pharmacy IC patient to advise. No answer. Unable to [facility] due to VM being full.

## 2020-06-20 NOTE — Therapy (Signed)
[place], [name], [address] Phone: [phone]   Fax:  [phone]  Physical Therapy Evaluation  Patient Details  Name: Christine Barrett MRN: [id number] Date of Birth: 1961/03/07 Referring Provider (PT): Dr. [name]   Encounter Date: 06/20/2020   PT End of Session - 06/20/20 1111    Visit Number 1    Number of Visits 20    Date for PT Re-Evaluation 08/29/20    Progress Note Due on Visit 10    PT Start Time 1020    PT Stop Time 1103    PT Time Calculation (min) 43 min    Activity Tolerance Patient limited by pain    Behavior During Therapy [facility] for tasks assessed/performed           Past Medical History:  Diagnosis Date  . Allergy    SEASONAL  . Anemia   . CAD S/P percutaneous coronary angioplasty 09/2013   a) Ostial AV G Cx - 2.5 mm Angiosculpt; mid LAD 40-60%; b) Myoview 07/2014: LOW RISK, small-severe fixed inferior defect c/w infarct w/o peri-infarct ischemia.  . Chronic bronchitis ([place])    "frequently; not q yr" (10/03/2013)  . Diabetes mellitus without complication ([place])    Type 2-diet controlled.   . Diverticulosis   . Essential hypertension    with prior Accelerated HTN  . GERD (gastroesophageal reflux disease)   . Hiatal hernia   . Hx of non-ST elevation myocardial infarction (NSTEMI) 10/01/2013   Due to Accelerated HTN with existing CAD  . Hyperlipemia   . Migraine    "@ least once/month" (10/03/2013)  . OSA (obstructive sleep apnea) 06/10/2016  . Schatzki's ring   . Seasonal allergies   . Sinusitis   . Spinal headache    during C-section of second child.     Past Surgical History:  Procedure Laterality Date  . ABDOMINAL HYSTERECTOMY  1994   "partial"  . CAPSULAR RELEASE Right 06/06/2020   Procedure: RIGHT SHOULDER MANIPULATION UNDER ANESTHESIA, ROTATOR CUFF REPAIR;  Surgeon: Meredith Pel, MD;  Location: [place];  Service: Orthopedics;  Laterality: Right;  . CARDIAC CATHETERIZATION N/A 08/05/2015    Procedure: Left Heart Cath and Coronary Angiography;  Surgeon: [name], MD;  Location: [place] CV LAB;  Service: Cardiovascular;  Laterality: N/A;  . CESAREAN SECTION  1989  . COLONOSCOPY    . CORONARY ANGIOPLASTY  10/03/2013   95% ostial AV G Cx - 2.5 mm AngioSculpt Balloon PTCA; mid LAD 40-60%  . LEFT HEART CATHETERIZATION WITH CORONARY ANGIOGRAM N/A 10/02/2013   Procedure: LEFT HEART CATHETERIZATION WITH CORONARY ANGIOGRAM;  Surgeon: [name], MD;  Location: [facility] CATH LAB;  Service: Cardiovascular;  Laterality: N/A;  . NASAL SEPTOPLASTY W/ TURBINOPLASTY  ~ 2007  . NM MYOVIEW LTD  08/22/2014    Low risk stress nuclear study with a small, severe, fixed defect in the distal inferior wall/apex suggestive of small prior infarct; no ischemia.  LV Wall Motion: NL LV Function; NL Wall Motion  . PERCUTANEOUS CORONARY STENT INTERVENTION (PCI-S) N/A 10/03/2013   cutting balloon angioplasty only no stent.   . TRANSTHORACIC ECHOCARDIOGRAM  10/02/2013   EF 55-60%; mild LVH, no RWMA,   . TUBAL LIGATION  1989    There were no vitals filed for this visit.    Subjective Assessment - 06/20/20 1023    Subjective Pt. had surgery performed on 06/06/2020 c shoulder manipulation, RTC repair, biceps tenodesis.  Pt. indicated initial complaints dated back to  early 2021 after fall on Rt side.  Pt. stated ER visit then and severe pain for several weeks.  Pt. had MRI performed indicated tear.  Physical therapy was performed and injection as well during summer.  Pt. stated pain returned in Sept and another injection c limited improvement.  Pt. indicated complaints in shoulder and arm as well.  Surgery was performed and has motion device for home movement.    Limitations House hold activities;Lifting    Patient Stated Goals Reduce pain    Currently in Pain? Yes    Pain Score 8    pain at worst 10/10   Pain Location Shoulder    Pain Orientation Right    Pain Descriptors / Indicators Tightness;Sharp     Pain Type Surgical pain;Chronic pain    Pain Onset More than a month ago    Pain Frequency Intermittent    Aggravating Factors  arm movement, lifting, carrying    Pain Relieving Factors medicine, heat    Effect of Pain on Daily Activities Limited in arm use              [facility] PT Assessment - 06/20/20 0001      Assessment   Medical Diagnosis S/P Rt shoulder manip, RTC repair, biceps tenodesis    Referring Provider (PT) Dr. [name]    Onset Date/Surgical Date 06/06/20    Hand Dominance Right      Precautions   Precaution Comments PROM for month      Balance Screen   Has the patient fallen in the past 6 months No    Has the patient had a decrease in activity level because of a fear of falling?  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  No sling      ROM / Strength   AROM / PROM / Strength Strength;PROM;AROM      AROM   Overall AROM Comments Lt GH jt WFL at this time.  No AROM on Rt due to surgical protocol    AROM Assessment Site Shoulder    Right/Left Shoulder Right;Left      PROM   Overall PROM Comments Pain limited on Rt side PROM    PROM Assessment Site Shoulder    Right/Left Shoulder Left;Right    Right Shoulder Flexion 50 Degrees    Right Shoulder ABduction 56 Degrees    Right Shoulder Internal Rotation 40 Degrees    Right Shoulder External Rotation 0  Degrees      Strength   Overall Strength Comments Rt UE not tested due to surgical protocol    Strength Assessment Site Shoulder;Elbow    Right/Left Shoulder Left;Right    Left Shoulder Flexion 5/5    Left Shoulder Extension 5/5    Left Shoulder ABduction 5/5    Left Shoulder Internal  Rotation 5/5    Left Shoulder External Rotation 5/5    Right/Left Elbow Left;Right    Left Elbow Flexion 5/5    Left Elbow Extension 5/5                      Objective measurements completed on examination: See above findings.       [facility] Adult PT Treatment/Exercise - 06/20/20 0001      Exercises   Exercises Shoulder;Other Exercises    Other Exercises  HEP instruction/review, handout given      Shoulder Exercises: Supine   Other Supine Exercises supine PROM Rt flexion c Lt UE assist x 5 c instruction      Shoulder Exercises: Seated   Other Seated Exercises seated table slides flexoin, scaption x 10 c instruction    Other Seated Exercises seated scap retraction x 5 c instruction      Modalities   Modalities Electrical Stimulation      Electrical Stimulation   Electrical Stimulation Location Rt shoulder    Electrical Stimulation Action Pre mod    Electrical Stimulation Parameters 10 mins to tolerance    Electrical Stimulation Goals Pain                  PT Education - 06/20/20 1111    Education Details HEP, POC    Person(s) Educated Patient    Methods Explanation;Demonstration;Verbal cues;Handout    Comprehension Returned demonstration;Verbalized understanding            PT Short Term Goals - 06/20/20 1100      PT SHORT TERM GOAL #1   Title Patient will demonstrate independent use of home exercise program to maintain progress from in clinic treatments.    Time 3    Period Weeks    Status New    Target Date 07/11/20             PT Long Term Goals - 06/20/20 1107      PT LONG TERM GOAL #1   Title Patient will demonstrate/report pain at worst less  than or equal to 2/10 to facilitate minimal limitation in daily activity secondary to pain symptoms.    Time 10    Period Weeks    Status New    Target Date 08/29/20      PT LONG TERM GOAL #2   Title Patient will demonstrate independent use of home exercise program to facilitate ability to maintain/progress functional gains from skilled physical therapy services.    Time 10    Period Weeks    Status New    Target Date 08/29/20      PT LONG TERM GOAL #3   Title Patient will demonstrate return to work/recreational activity at previous level of function without limitations secondary due to condition    Time 10    Period Weeks    Status New    Target Date 08/29/20      PT LONG TERM GOAL #4   Title Pt. will demonstrate Rt shoulder AROM WFL s symptoms to facilitate normal daily use and reaching at PLOF.    Time 10    Period Weeks    Status New    Target Date 08/29/20      PT LONG TERM GOAL #5   Title Pt. will demonstrate Rt UE MMT 5/5 throughout to facilitate ability to perform daily lifting, carrying house activity at PLOF.    Time 10    Period Weeks  Status New    Target Date 08/29/20                  Plan - 06/20/20 1058    Clinical Impression Statement Patient is a  59 y.o. female who comes to clinic with complaints of Rt shoulder pain with mobility, strength and deficits that impair their ability to perform usual daily and recreational functional activities without increase difficulty/symptoms at this time.  Patient to benefit from skilled PT services to address impairments and limitations to improve to previous level of function without restriction secondary to condition.    Personal Factors and Comorbidities Comorbidity 3+    Comorbidities DM, GERD, HTN, hyperlipidemia    Examination-Activity Limitations Bathing;Bed Mobility;Sleep;Sit;Carry;Toileting;Dressing;Hygiene/Grooming;Lift;Reach Overhead    Examination-Participation Restrictions Occupation;Community  Activity;Yard Work;Driving;Cleaning;Meal Prep    Stability/Clinical Decision Making Stable/Uncomplicated    Clinical Decision Making Low    Rehab Potential Good    PT Frequency 2x / week    PT Duration Other (comment)   10 weeks   PT Treatment/Interventions ADLs/Self Care Home Management;Electrical Stimulation;Cryotherapy;Iontophoresis 4mg /ml Dexamethasone;Moist Heat;Balance training;Therapeutic exercise;Therapeutic activities;Functional mobility training;Gait training;DME Instruction;Ultrasound;Neuromuscular re-education;Patient/family education;Passive range of motion;Spinal Manipulations;Joint Manipulations;Dry needling;Taping;Vasopneumatic Device;Manual techniques    PT Next Visit Plan Manual and passive ROM, estim prn    PT Home Exercise Plan [id number]    Consulted and Agree with Plan of Care Patient           Patient will benefit from skilled therapeutic intervention in order to improve the following deficits and impairments:     Visit Diagnosis: Chronic right shoulder pain  Muscle weakness (generalized)  Stiffness of right shoulder joint  Localized edema     Problem List Patient Active Problem List   Diagnosis Date Noted  . Type 2 diabetes mellitus with other circulatory complications, HTN, CVD ([place]) 05/17/2020  . Morbid obesity ([place]) 05/17/2020  . BMI 40.0-44.9, adult ([place]) 05/17/2020  . OSA (obstructive sleep apnea) 06/10/2016  . Costochondritis 06/09/2016  . Bilateral leg cramps 02/07/2016  . Peripheral edema 01/29/2016  . Essential hypertension   . GERD (gastroesophageal reflux disease) 08/10/2014  . Menopausal syndrome 08/10/2014  . CAD S/P percutaneous coronary angioplasty - Ostial AVG Cx - Scoring balloon PTCA 10/03/2013  . Hyperlipidemia with target LDL less than 70 10/02/2013  . H/O non-ST elevation myocardial infarction (NSTEMI) 10/02/2013  . Normocytic anemia, not due to blood loss   . Allergic rhinitis 03/17/2007  . MIGRAINE, COMMON W/O INTRACTABLE  MIGRAINE 03/16/2007    Scot Jun, PT, DPT, OCS, ATC 06/20/20  11:26 AM    [facility] Physical Therapy [address] [place], [name], [address] Phone: [phone]   Fax:  [phone]  Name: Christine Barrett MRN: [id number] Date of Birth: Aug 03, 1960

## 2020-06-20 NOTE — Patient Instructions (Signed)
Access Code: [email or web address] URL: [email or web address] Date: 06/20/2020 Prepared by: Scot Jun  Exercises Seated Scapular Retraction - 2 x daily - 7 x weekly - 2 sets - 10 reps - 5 hold Supine Shoulder Flexion AAROM with Hands Clasped - 2 x daily - 7 x weekly - 3 sets - 10 reps - 5 hold Seated Shoulder Scaption Slide at Table Top with Forearm in Neutral - 2 x daily - 7 x weekly - 2 sets - 10 reps - 5 hold Seated Shoulder Flexion Towel Slide at Table Top - 2 x daily - 7 x weekly - 10 reps - 2-3 sets - 5 hold

## 2020-06-25 ENCOUNTER — Telehealth: Payer: Self-pay

## 2020-06-25 ENCOUNTER — Other Ambulatory Visit: Payer: Self-pay

## 2020-06-25 ENCOUNTER — Other Ambulatory Visit: Payer: Self-pay | Admitting: Family Medicine

## 2020-06-25 ENCOUNTER — Encounter: Payer: Self-pay | Admitting: Family Medicine

## 2020-06-25 ENCOUNTER — Ambulatory Visit (INDEPENDENT_AMBULATORY_CARE_PROVIDER_SITE_OTHER): Payer: 59 | Admitting: Family Medicine

## 2020-06-25 ENCOUNTER — Other Ambulatory Visit (INDEPENDENT_AMBULATORY_CARE_PROVIDER_SITE_OTHER): Payer: 59

## 2020-06-25 VITALS — Ht 69.0 in | Wt 282.0 lb

## 2020-06-25 DIAGNOSIS — Z20822 Contact with and (suspected) exposure to covid-19: Secondary | ICD-10-CM

## 2020-06-25 NOTE — Patient Instructions (Signed)
Based on your symptoms, it looks like you have a virus.   Antibiotics are not need for a viral infection but the following will help:   1. Drink plenty of fluids 2. Get lots of rest  Sinus Congestion 1) Neti Pot (Saline rinse) -- 2 times day -- if tolerated 2) Flonase (Store Brand ok) - once daily 3) Over the counter congestion medications  Cough 1) Cough drops can be helpful 2) Nyquil (or nighttime cough medication) 3) Honey is proven to be one of the best cough medications  4) Cough medicine with Dextromethorphan can also be helpful  Sore Throat 1) Honey as above, cough drops 2) Ibuprofen or Aleve can be helpful 3) Salt water Gargles  If you develop fevers (Temperature >100.4), chills, worsening symptoms or symptoms lasting longer than 10 days return to clinic.     Would also recommend Covid testing  Isolate until the results are back - if negative can end isolation.   If covid is positive will need to isolate for 10 days

## 2020-06-25 NOTE — Telephone Encounter (Signed)
See note from today

## 2020-06-25 NOTE — Telephone Encounter (Signed)
Pt left v/m that she has seasonal sinus drainage and prod cough with yellow phlegm and pt request abx. Pt presently taking EmergenC and ginger tea. [facility]. No fever and no other covid symptoms. Prod cough and sinus drainage started on 06/23/20. Pt scheduled video visit on 06/25/20 at 4 pm with Dr Selena Batten.

## 2020-06-25 NOTE — Progress Notes (Signed)
    I connected with Christine Barrett on 06/25/20 at  4:00 PM EST by video and verified that I am speaking with the correct person using two identifiers.   I discussed the limitations, risks, security and privacy concerns of performing an evaluation and management service by video and the availability of in person appointments. I also discussed with the patient that there may be a patient responsible charge related to this service. The patient expressed understanding and agreed to proceed.  Patient location: Home Provider Location: [place] [facility] Participants: Lynnda Child and Valentino Saxon Dancy   Subjective:     Christine Barrett is a 59 y.o. female presenting for Sinusitis and Cough (With yellow phelgm)     Sinusitis This is a new problem. Episode onset: 06/23/2020. There has been no fever. Associated symptoms include congestion and coughing (yellow phelgm). Pertinent negatives include no chills, sinus pressure, sneezing or sore throat. (Worse in the morning) Treatments tried: ginger chews, emergency C, hot tea. The treatment provided mild relief.   Covid vaccine - first 2 shots, no booster yet No loss of taste/smell No known sick contact  Takes xyzal and montelukast   Review of Systems  Constitutional: Negative for chills and fever.  HENT: Positive for congestion and postnasal drip. Negative for sinus pressure, sinus pain, sneezing and sore throat.   Respiratory: Positive for cough (yellow phelgm).   Gastrointestinal: Negative for diarrhea, nausea and vomiting.  Musculoskeletal: Negative for arthralgias and myalgias.     Social History   Tobacco Use  Smoking Status Never Smoker  Smokeless Tobacco Never Used        Objective:   BP Readings from Last 3 Encounters:  06/06/20 (!) 164/82  06/04/20 (!) 163/91  05/17/20 140/84   Wt Readings from Last 3 Encounters:  06/25/20 282 lb (127.9 kg)  06/19/20 287 lb (130.2 kg)  06/06/20 289 lb 3.2 oz (131.2 kg)    Ht 5\' 9"   (1.753 m)   Wt 282 lb (127.9 kg)   BMI 41.64 kg/m   Physical Exam Constitutional:      Appearance: Normal appearance. She is not ill-appearing.  HENT:     Head: Normocephalic and atraumatic.     Right Ear: External ear normal.     Left Ear: External ear normal.  Eyes:     Conjunctiva/sclera: Conjunctivae normal.  Pulmonary:     Effort: Pulmonary effort is normal. No respiratory distress.  Neurological:     Mental Status: She is alert. Mental status is at baseline.  Psychiatric:        Mood and Affect: Mood normal.        Behavior: Behavior normal.        Thought Content: Thought content normal.        Judgment: Judgment normal.          Assessment & Plan:   Problem List Items Addressed This Visit   None   Visit Diagnoses    Suspected COVID-19 virus infection    -  Primary     Discussed OTC treatment for viral illness Patient will come today for drive-up testing Instructed to isolate until the results come back  If negative and symptoms persisting will call back for consideration for course of antibiotics    Return if symptoms worsen or fail to improve.  , MD

## 2020-06-26 ENCOUNTER — Encounter: Payer: 59 | Admitting: Physical Therapy

## 2020-06-27 LAB — NOVEL CORONAVIRUS, NAA: SARS-CoV-2, NAA: NOT DETECTED

## 2020-06-27 LAB — SARS-COV-2, NAA 2 DAY TAT

## 2020-07-03 ENCOUNTER — Ambulatory Visit (INDEPENDENT_AMBULATORY_CARE_PROVIDER_SITE_OTHER): Payer: 59 | Admitting: Orthopedic Surgery

## 2020-07-03 ENCOUNTER — Ambulatory Visit: Payer: 59 | Admitting: Rehabilitative and Restorative Service Providers"

## 2020-07-03 DIAGNOSIS — M75101 Unspecified rotator cuff tear or rupture of right shoulder, not specified as traumatic: Secondary | ICD-10-CM

## 2020-07-04 ENCOUNTER — Telehealth: Payer: Self-pay | Admitting: Orthopedic Surgery

## 2020-07-04 ENCOUNTER — Other Ambulatory Visit: Payer: Self-pay | Admitting: Surgical

## 2020-07-04 MED ORDER — OXYCODONE-ACETAMINOPHEN 5-325 MG PO TABS: 1.0000 | ORAL_TABLET | Freq: Two times a day (BID) | ORAL | 0 refills | Status: DC | PRN

## 2020-07-04 NOTE — Telephone Encounter (Signed)
submitted

## 2020-07-04 NOTE — Telephone Encounter (Signed)
Patient called. She would like a refill on oxycodone. Her call back number is [phone]

## 2020-07-04 NOTE — Telephone Encounter (Signed)
I called and advised that rx has been sent to pharm.

## 2020-07-04 NOTE — Telephone Encounter (Signed)
S/p right shoulder surgery in office yesterday. Pt requesting refill on Oxycodone. Last refill was 06/17/20 #25 please advise.

## 2020-07-06 ENCOUNTER — Encounter: Payer: Self-pay | Admitting: Orthopedic Surgery

## 2020-07-06 NOTE — Progress Notes (Unsigned)
Post-Op Visit Note   Patient: Christine Barrett           Date of Birth: December 01, 1960           MRN: [id number] Visit Date: 07/03/2020 PCP: Jinny Sanders, MD   Assessment & Plan:  Chief Complaint:  Chief Complaint  Patient presents with  . Right Shoulder - Routine Post Op   Visit Diagnoses:  1. Tear of right supraspinatus tendon     Plan: Patient is a 60 year old female presents s/p right shoulder manipulation under anesthesia with rotator cuff repair on 06/06/2020.  She states that she was sick last week so she had to cancel her appointment and she was coughing a lot which made her right shoulder sore.  She is scheduled for physical therapy starting next week.  She is only using her arm brace 1 time a day over the last week but she is up to 90 degrees on the machine.  She is off she is off pain medication over the over the last 2 weeks.  She is still sleeping in her recliner.  On exam her incisions are healing well with no evidence of infection or dehiscence.  30 degrees external rotation, 65 degrees abduction, 70 degrees forward flexion.  Highly encourage patient to continue with CPM machine 3-4 times a day for an hour each time to really work on her passive range of motion.  She will also have help with passive motion and physical therapy when she starts next week.  Hold off on any rotator cuff strengthening until about 6 weeks out from procedure which should be July 18, 2020.  Follow-up with the office for clinical recheck in 3 weeks.  Follow-Up Instructions: No follow-ups on file.   Orders:  No orders of the defined types were placed in this encounter.  No orders of the defined types were placed in this encounter.   Imaging: No results found.  PMFS History: Patient Active Problem List   Diagnosis Date Noted  . Type 2 diabetes mellitus with other circulatory complications, HTN, CVD ([place]) 05/17/2020  . Morbid obesity ([place]) 05/17/2020  . BMI 40.0-44.9, adult ([place]) 05/17/2020  .  OSA (obstructive sleep apnea) 06/10/2016  . Costochondritis 06/09/2016  . Bilateral leg cramps 02/07/2016  . Peripheral edema 01/29/2016  . Essential hypertension   . GERD (gastroesophageal reflux disease) 08/10/2014  . Menopausal syndrome 08/10/2014  . CAD S/P percutaneous coronary angioplasty - Ostial AVG Cx - Scoring balloon PTCA 10/03/2013  . Hyperlipidemia with target LDL less than 70 10/02/2013  . H/O non-ST elevation myocardial infarction (NSTEMI) 10/02/2013  . Normocytic anemia, not due to blood loss   . Allergic rhinitis 03/17/2007  . MIGRAINE, COMMON W/O INTRACTABLE MIGRAINE 03/16/2007   Past Medical History:  Diagnosis Date  . Allergy    SEASONAL  . Anemia   . CAD S/P percutaneous coronary angioplasty 09/2013   a) Ostial AV G Cx - 2.5 mm Angiosculpt; mid LAD 40-60%; b) Myoview 07/2014: LOW RISK, small-severe fixed inferior defect c/w infarct w/o peri-infarct ischemia.  . Chronic bronchitis ([place])    "frequently; not q yr" (10/03/2013)  . Diabetes mellitus without complication ([place])    Type 2-diet controlled.   . Diverticulosis   . Essential hypertension    with prior Accelerated HTN  . GERD (gastroesophageal reflux disease)   . Hiatal hernia   . Hx of non-ST elevation myocardial infarction (NSTEMI) 10/01/2013   Due to Accelerated HTN with existing CAD  . Hyperlipemia   .  Migraine    "@ least once/month" (10/03/2013)  . OSA (obstructive sleep apnea) 06/10/2016  . Schatzki's ring   . Seasonal allergies   . Sinusitis   . Spinal headache    during C-section of second child.     Family History  Adopted: Yes  Problem Relation Age of Onset  . Diabetes Mother   . Breast cancer Sister   . Hypertension Sister   . Colon cancer Neg Hx   . Heart attack Neg Hx   . Stroke Neg Hx     Past Surgical History:  Procedure Laterality Date  . ABDOMINAL HYSTERECTOMY  1994   "partial"  . CAPSULAR RELEASE Right 06/06/2020   Procedure: RIGHT SHOULDER MANIPULATION UNDER ANESTHESIA,  ROTATOR CUFF REPAIR;  Surgeon: Meredith Pel, MD;  Location: [place];  Service: Orthopedics;  Laterality: Right;  . CARDIAC CATHETERIZATION N/A 08/05/2015   Procedure: Left Heart Cath and Coronary Angiography;  Surgeon: [name], MD;  Location: [place] [facility];  Service: Cardiovascular;  Laterality: N/A;  . CESAREAN SECTION  1989  . COLONOSCOPY    . CORONARY ANGIOPLASTY  10/03/2013   95% ostial AV G Cx - 2.5 mm AngioSculpt Balloon PTCA; mid LAD 40-60%  . LEFT HEART CATHETERIZATION WITH CORONARY ANGIOGRAM N/A 10/02/2013   Procedure: LEFT HEART CATHETERIZATION WITH CORONARY ANGIOGRAM;  Surgeon: [name], MD;  Location: [facility] CATH LAB;  Service: Cardiovascular;  Laterality: N/A;  . NASAL SEPTOPLASTY W/ TURBINOPLASTY  ~ 2007  . NM MYOVIEW LTD  08/22/2014    Low risk stress nuclear study with a small, severe, fixed defect in the distal inferior wall/apex suggestive of small prior infarct; no ischemia.  LV Wall Motion: NL LV Function; NL Wall Motion  . PERCUTANEOUS CORONARY STENT INTERVENTION (PCI-S) N/A 10/03/2013   cutting balloon angioplasty only no stent.   . TRANSTHORACIC ECHOCARDIOGRAM  10/02/2013   EF 55-60%; mild LVH, no RWMA,   . TUBAL LIGATION  1989   Social History   Occupational History  . Occupation: Research scientist (life sciences)  Tobacco Use  . Smoking status: Never Smoker  . Smokeless tobacco: Never Used  Vaping Use  . Vaping Use: Never used  Substance and Sexual Activity  . Alcohol use: Yes    Alcohol/week: 0.0 standard drinks    Comment: occ  . Drug use: No  . Sexual activity: Yes    Birth control/protection: Surgical

## 2020-07-09 LAB — HM DIABETES EYE EXAM

## 2020-07-10 ENCOUNTER — Other Ambulatory Visit: Payer: Self-pay

## 2020-07-10 ENCOUNTER — Ambulatory Visit (INDEPENDENT_AMBULATORY_CARE_PROVIDER_SITE_OTHER): Payer: 59 | Admitting: Physical Therapy

## 2020-07-10 ENCOUNTER — Encounter: Payer: Self-pay | Admitting: Physical Therapy

## 2020-07-10 DIAGNOSIS — M25611 Stiffness of right shoulder, not elsewhere classified: Secondary | ICD-10-CM

## 2020-07-10 DIAGNOSIS — G8929 Other chronic pain: Secondary | ICD-10-CM

## 2020-07-10 DIAGNOSIS — M6281 Muscle weakness (generalized): Secondary | ICD-10-CM

## 2020-07-10 DIAGNOSIS — M25511 Pain in right shoulder: Secondary | ICD-10-CM

## 2020-07-10 DIAGNOSIS — R600 Localized edema: Secondary | ICD-10-CM

## 2020-07-10 NOTE — Therapy (Signed)
[facility] [place], [name], [address] Phone: [phone]   Fax:  [phone]  Physical Therapy Treatment  Patient Details  Name: Christine Barrett MRN: [id number] Date of Birth: 07-Jan-1961 Referring Provider (PT): Dr. [name]   Encounter Date: 07/10/2020   PT End of Session - 07/10/20 1611    Visit Number 2    Number of Visits 20    Date for PT Re-Evaluation 08/29/20    Progress Note Due on Visit 10    PT Start Time 0313    PT Stop Time 0412    PT Time Calculation (min) 59 min    Activity Tolerance Patient limited by pain    Behavior During Therapy [facility] for tasks assessed/performed           Past Medical History:  Diagnosis Date  . Allergy    SEASONAL  . Anemia   . CAD S/P percutaneous coronary angioplasty 09/2013   a) Ostial AV G Cx - 2.5 mm Angiosculpt; mid LAD 40-60%; b) Myoview 07/2014: LOW RISK, small-severe fixed inferior defect c/w infarct w/o peri-infarct ischemia.  . Chronic bronchitis ([place])    "frequently; not q yr" (10/03/2013)  . Diabetes mellitus without complication ([place])    Type 2-diet controlled.   . Diverticulosis   . Essential hypertension    with prior Accelerated HTN  . GERD (gastroesophageal reflux disease)   . Hiatal hernia   . Hx of non-ST elevation myocardial infarction (NSTEMI) 10/01/2013   Due to Accelerated HTN with existing CAD  . Hyperlipemia   . Migraine    "@ least once/month" (10/03/2013)  . OSA (obstructive sleep apnea) 06/10/2016  . Schatzki's ring   . Seasonal allergies   . Sinusitis   . Spinal headache    during C-section of second child.     Past Surgical History:  Procedure Laterality Date  . ABDOMINAL HYSTERECTOMY  1994   "partial"  . CAPSULAR RELEASE Right 06/06/2020   Procedure: RIGHT SHOULDER MANIPULATION UNDER ANESTHESIA, ROTATOR CUFF REPAIR;  Surgeon: Meredith Pel, MD;  Location: [place];  Service: Orthopedics;  Laterality: Right;  . CARDIAC CATHETERIZATION N/A 08/05/2015    Procedure: Left Heart Cath and Coronary Angiography;  Surgeon: [name], MD;  Location: [place] [facility];  Service: Cardiovascular;  Laterality: N/A;  . CESAREAN SECTION  1989  . COLONOSCOPY    . CORONARY ANGIOPLASTY  10/03/2013   95% ostial AV G Cx - 2.5 mm AngioSculpt Balloon PTCA; mid LAD 40-60%  . LEFT HEART CATHETERIZATION WITH CORONARY ANGIOGRAM N/A 10/02/2013   Procedure: LEFT HEART CATHETERIZATION WITH CORONARY ANGIOGRAM;  Surgeon: [name], MD;  Location: [facility] CATH LAB;  Service: Cardiovascular;  Laterality: N/A;  . NASAL SEPTOPLASTY W/ TURBINOPLASTY  ~ 2007  . NM MYOVIEW LTD  08/22/2014    Low risk stress nuclear study with a small, severe, fixed defect in the distal inferior wall/apex suggestive of small prior infarct; no ischemia.  LV Wall Motion: NL LV Function; NL Wall Motion  . PERCUTANEOUS CORONARY STENT INTERVENTION (PCI-S) N/A 10/03/2013   cutting balloon angioplasty only no stent.   . TRANSTHORACIC ECHOCARDIOGRAM  10/02/2013   EF 55-60%; mild LVH, no RWMA,   . TUBAL LIGATION  1989    There were no vitals filed for this visit.   Subjective Assessment - 07/10/20 1555    Subjective Pt. reported sickness in the past few weeks that have deterred her from increasing ROM. She is still having pain and notes  that her motion is still very limited. Some days are more painful sometimes with colder weather.    Limitations House hold activities;Lifting    Patient Stated Goals Reduce pain    Pain Score 7     Pain Onset More than a month ago                             [facility] Adult PT Treatment/Exercise - 07/10/20 0001      Shoulder Exercises: Supine   Flexion PROM;Right;10 reps   hands clasped self PROM     Shoulder Exercises: Seated   Other Seated Exercises seated table slides flexion, scaption x 10 c instruction, ER x 10      Shoulder Exercises: Standing   Other Standing Exercises pendulums CW and CCW 10 ea      Shoulder Exercises: Pulleys    Flexion 2 minutes    ABduction 2 minutes      Modalities   Modalities Cryotherapy      Cryotherapy   Number Minutes Cryotherapy 10 Minutes    Cryotherapy Location Shoulder    Type of Cryotherapy Ice pack      Manual Therapy   Manual therapy comments PROM flexion, abduction, IR, and ER 10 mins AP grade 3 mobilizations 30 sec x3, inferior mobs grade 2 20 sec x3, distractions 10 mins                    PT Short Term Goals - 06/20/20 1100      PT SHORT TERM GOAL #1   Title Patient will demonstrate independent use of home exercise program to maintain progress from in clinic treatments.    Time 3    Period Weeks    Status New    Target Date 07/11/20             PT Long Term Goals - 06/20/20 1107      PT LONG TERM GOAL #1   Title Patient will demonstrate/report pain at worst less than or equal to 2/10 to facilitate minimal limitation in daily activity secondary to pain symptoms.    Time 10    Period Weeks    Status New    Target Date 08/29/20      PT LONG TERM GOAL #2   Title Patient will demonstrate independent use of home exercise program to facilitate ability to maintain/progress functional gains from skilled physical therapy services.    Time 10    Period Weeks    Status New    Target Date 08/29/20      PT LONG TERM GOAL #3   Title Patient will demonstrate return to work/recreational activity at previous level of function without limitations secondary due to condition    Time 10    Period Weeks    Status New    Target Date 08/29/20      PT LONG TERM GOAL #4   Title Pt. will demonstrate Rt shoulder AROM WFL s symptoms to facilitate normal daily use and reaching at PLOF.    Time 10    Period Weeks    Status New    Target Date 08/29/20      PT LONG TERM GOAL #5   Title Pt. will demonstrate Rt UE MMT 5/5 throughout to facilitate ability to perform daily lifting, carrying house activity at PLOF.    Time 10    Period Weeks    Status New  Target Date  08/29/20                 Plan - 07/10/20 1615    Clinical Impression Statement Pt. relayed that she felt some pain throughout the entirety of tx which was expected since she has noted decreased mobility in the past week from being sick. She tolerated PROM and AAROM well and demonstrated the ability to continue these ROM activities at home. We will continue to progress her motion in order to move onto gentle strenghtening for increased function. She will be at week 6 aroune Jan 20th so will plan for AROM then if she can tolerate that.    Personal Factors and Comorbidities Comorbidity 3+    Comorbidities DM, GERD, HTN, hyperlipidemia    Examination-Activity Limitations Bathing;Bed Mobility;Sleep;Sit;Carry;Toileting;Dressing;Hygiene/Grooming;Lift;Reach Overhead    Examination-Participation Restrictions Occupation;Community Activity;Yard Work;Driving;Cleaning;Meal Prep    Stability/Clinical Decision Making Stable/Uncomplicated    Rehab Potential Good    PT Frequency 2x / week    PT Duration Other (comment)   10 weeks   PT Treatment/Interventions ADLs/Self Care Home Management;Electrical Stimulation;Cryotherapy;Iontophoresis 4mg /ml Dexamethasone;Moist Heat;Balance training;Therapeutic exercise;Therapeutic activities;Functional mobility training;Gait training;DME Instruction;Ultrasound;Neuromuscular re-education;Patient/family education;Passive range of motion;Spinal Manipulations;Joint Manipulations;Dry needling;Taping;Vasopneumatic Device;Manual techniques    PT Next Visit Plan Manual and passive ROM, estim prn, add in isometrics    PT [place] and Agree with Plan of Care Patient           Patient will benefit from skilled therapeutic intervention in order to improve the following deficits and impairments:     Visit Diagnosis: Chronic right shoulder pain  Muscle weakness (generalized)  Stiffness of right shoulder joint  Localized edema  Acute pain  of right shoulder     Problem List Patient Active Problem List   Diagnosis Date Noted  . Type 2 diabetes mellitus with other circulatory complications, HTN, CVD ([place]) 05/17/2020  . Morbid obesity ([place]) 05/17/2020  . BMI 40.0-44.9, adult ([place]) 05/17/2020  . OSA (obstructive sleep apnea) 06/10/2016  . Costochondritis 06/09/2016  . Bilateral leg cramps 02/07/2016  . Peripheral edema 01/29/2016  . Essential hypertension   . GERD (gastroesophageal reflux disease) 08/10/2014  . Menopausal syndrome 08/10/2014  . CAD S/P percutaneous coronary angioplasty - Ostial AVG Cx - Scoring balloon PTCA 10/03/2013  . Hyperlipidemia with target LDL less than 70 10/02/2013  . H/O non-ST elevation myocardial infarction (NSTEMI) 10/02/2013  . Normocytic anemia, not due to blood loss   . Allergic rhinitis 03/17/2007  . MIGRAINE, COMMON W/O INTRACTABLE MIGRAINE 03/16/2007    Debbe Odea, PT,DPT 07/10/2020, 4:19 PM  [facility] Physical Therapy [address] [place], [name], [address] Phone: [phone]   Fax:  [phone]  Name: Christine Barrett MRN: [id number] Date of Birth: 12/10/1960

## 2020-07-12 ENCOUNTER — Encounter: Payer: Self-pay | Admitting: Rehabilitative and Restorative Service Providers"

## 2020-07-12 ENCOUNTER — Other Ambulatory Visit: Payer: Self-pay

## 2020-07-12 ENCOUNTER — Ambulatory Visit (INDEPENDENT_AMBULATORY_CARE_PROVIDER_SITE_OTHER): Payer: 59 | Admitting: Rehabilitative and Restorative Service Providers"

## 2020-07-12 DIAGNOSIS — G8929 Other chronic pain: Secondary | ICD-10-CM

## 2020-07-12 DIAGNOSIS — M6281 Muscle weakness (generalized): Secondary | ICD-10-CM | POA: Diagnosis not present

## 2020-07-12 DIAGNOSIS — M25611 Stiffness of right shoulder, not elsewhere classified: Secondary | ICD-10-CM

## 2020-07-12 DIAGNOSIS — M25511 Pain in right shoulder: Secondary | ICD-10-CM | POA: Diagnosis not present

## 2020-07-12 DIAGNOSIS — R600 Localized edema: Secondary | ICD-10-CM | POA: Diagnosis not present

## 2020-07-12 NOTE — Therapy (Signed)
[facility] [place], [name], [address] Phone: [phone]   Fax:  [phone]  Physical Therapy Treatment  Patient Details  Name: Maryagnes Carrasco MRN: [id number] Date of Birth: 09-Sep-1960 Referring Provider (PT): Dr. [name]   Encounter Date: 07/12/2020   PT End of Session - 07/12/20 0948    Visit Number 3    Number of Visits 20    Date for PT Re-Evaluation 08/29/20    Progress Note Due on Visit 10    PT Start Time 0933    PT Stop Time 1021    PT Time Calculation (min) 48 min    Activity Tolerance Patient limited by pain    Behavior During Therapy [facility] for tasks assessed/performed           Past Medical History:  Diagnosis Date  . Allergy    SEASONAL  . Anemia   . CAD S/P percutaneous coronary angioplasty 09/2013   a) Ostial AV G Cx - 2.5 mm Angiosculpt; mid LAD 40-60%; b) Myoview 07/2014: LOW RISK, small-severe fixed inferior defect c/w infarct w/o peri-infarct ischemia.  . Chronic bronchitis ([place])    "frequently; not q yr" (10/03/2013)  . Diabetes mellitus without complication ([place])    Type 2-diet controlled.   . Diverticulosis   . Essential hypertension    with prior Accelerated HTN  . GERD (gastroesophageal reflux disease)   . Hiatal hernia   . Hx of non-ST elevation myocardial infarction (NSTEMI) 10/01/2013   Due to Accelerated HTN with existing CAD  . Hyperlipemia   . Migraine    "@ least once/month" (10/03/2013)  . OSA (obstructive sleep apnea) 06/10/2016  . Schatzki's ring   . Seasonal allergies   . Sinusitis   . Spinal headache    during C-section of second child.     Past Surgical History:  Procedure Laterality Date  . ABDOMINAL HYSTERECTOMY  1994   "partial"  . CAPSULAR RELEASE Right 06/06/2020   Procedure: RIGHT SHOULDER MANIPULATION UNDER ANESTHESIA, ROTATOR CUFF REPAIR;  Surgeon: Meredith Pel, MD;  Location: [place];  Service: Orthopedics;  Laterality: Right;  . CARDIAC CATHETERIZATION N/A 08/05/2015    Procedure: Left Heart Cath and Coronary Angiography;  Surgeon: [name], MD;  Location: [place] [facility];  Service: Cardiovascular;  Laterality: N/A;  . CESAREAN SECTION  1989  . COLONOSCOPY    . CORONARY ANGIOPLASTY  10/03/2013   95% ostial AV G Cx - 2.5 mm AngioSculpt Balloon PTCA; mid LAD 40-60%  . LEFT HEART CATHETERIZATION WITH CORONARY ANGIOGRAM N/A 10/02/2013   Procedure: LEFT HEART CATHETERIZATION WITH CORONARY ANGIOGRAM;  Surgeon: [name], MD;  Location: [facility] CATH LAB;  Service: Cardiovascular;  Laterality: N/A;  . NASAL SEPTOPLASTY W/ TURBINOPLASTY  ~ 2007  . NM MYOVIEW LTD  08/22/2014    Low risk stress nuclear study with a small, severe, fixed defect in the distal inferior wall/apex suggestive of small prior infarct; no ischemia.  LV Wall Motion: NL LV Function; NL Wall Motion  . PERCUTANEOUS CORONARY STENT INTERVENTION (PCI-S) N/A 10/03/2013   cutting balloon angioplasty only no stent.   . TRANSTHORACIC ECHOCARDIOGRAM  10/02/2013   EF 55-60%; mild LVH, no RWMA,   . TUBAL LIGATION  1989    There were no vitals filed for this visit.   Subjective Assessment - 07/12/20 0941    Subjective Pt. stated sickness seemed to cause her trouble and some continued soreness, difficulty lying on back still (not sleeping in bed).  Limitations House hold activities;Lifting    Patient Stated Goals Reduce pain    Pain Score 8     Pain Location Shoulder    Pain Orientation Right    Pain Descriptors / Indicators Tightness;Sharp    Pain Type Surgical pain;Chronic pain    Pain Onset More than a month ago    Pain Frequency Constant    Aggravating Factors  constant resting pain, movement, lying on back.    Pain Relieving Factors medicines help.                             [facility] Adult PT Treatment/Exercise - 07/12/20 0001      Shoulder Exercises: Supine   Other Supine Exercises supine want flexion, abduction, er  to tolerance x 5 each      Shoulder Exercises:  Standing   Other Standing Exercises pendulums CW and CCW 10 ea      Modalities   Modalities Vasopneumatic      Electrical Stimulation   Electrical Stimulation Location Rt shoulder    Electrical Stimulation Parameters 10 mins to tolerance    Electrical Stimulation Goals Pain      Vasopneumatic   Number Minutes Vasopneumatic  10 minutes    Vasopnuematic Location  Shoulder    Vasopneumatic Pressure Medium    Vasopneumatic Temperature  34      Manual Therapy   Manual therapy comments PROM, g2-g3 mobs all directions Rt Gh jt                    PT Short Term Goals - 07/12/20 1017      PT SHORT TERM GOAL #1   Title Patient will demonstrate independent use of home exercise program to maintain progress from in clinic treatments.    Time 3    Period Weeks    Status On-going    Target Date 07/11/20             PT Long Term Goals - 06/20/20 1107      PT LONG TERM GOAL #1   Title Patient will demonstrate/report pain at worst less than or equal to 2/10 to facilitate minimal limitation in daily activity secondary to pain symptoms.    Time 10    Period Weeks    Status New    Target Date 08/29/20      PT LONG TERM GOAL #2   Title Patient will demonstrate independent use of home exercise program to facilitate ability to maintain/progress functional gains from skilled physical therapy services.    Time 10    Period Weeks    Status New    Target Date 08/29/20      PT LONG TERM GOAL #3   Title Patient will demonstrate return to work/recreational activity at previous level of function without limitations secondary due to condition    Time 10    Period Weeks    Status New    Target Date 08/29/20      PT LONG TERM GOAL #4   Title Pt. will demonstrate Rt shoulder AROM WFL s symptoms to facilitate normal daily use and reaching at PLOF.    Time 10    Period Weeks    Status New    Target Date 08/29/20      PT LONG TERM GOAL #5   Title Pt. will demonstrate Rt UE MMT  5/5 throughout to facilitate ability to perform daily lifting, carrying house  activity at PLOF.    Time 10    Period Weeks    Status New    Target Date 08/29/20                 Plan - 07/12/20 0944    Clinical Impression Statement Overall presentation of severity and irritability of insidious constant soreness/pain still noted, with exacerbation noted at times c mobility intervention.  Noted limitation in overall passive mobility at this time, triggering concern about overall progress.  Will continued to address c pain management stategies in clinic (estim, etc) as well as continued manual and ther ex intervention to promote improved passive mobility c transitioning to aarom.    Personal Factors and Comorbidities Comorbidity 3+    Comorbidities DM, GERD, HTN, hyperlipidemia    Examination-Activity Limitations Bathing;Bed Mobility;Sleep;Sit;Carry;Toileting;Dressing;Hygiene/Grooming;Lift;Reach Overhead    Examination-Participation Restrictions Occupation;Community Activity;Yard Work;Driving;Cleaning;Meal Prep    Stability/Clinical Decision Making Stable/Uncomplicated    Rehab Potential Good    PT Frequency 2x / week    PT Duration Other (comment)   10 weeks   PT Treatment/Interventions ADLs/Self Care Home Management;Electrical Stimulation;Cryotherapy;Iontophoresis 4mg /ml Dexamethasone;Moist Heat;Balance training;Therapeutic exercise;Therapeutic activities;Functional mobility training;Gait training;DME Instruction;Ultrasound;Neuromuscular re-education;Patient/family education;Passive range of motion;Spinal Manipulations;Joint Manipulations;Dry needling;Taping;Vasopneumatic Device;Manual techniques    PT Next Visit Plan Manual and passive ROM, estim prn, aarom as tolerated    PT Home Exercise Plan [email or web address]    Consulted and Agree with Plan of Care Patient           Patient will benefit from skilled therapeutic intervention in order to improve the following deficits and impairments:      Visit Diagnosis: Chronic right shoulder pain  Muscle weakness (generalized)  Stiffness of right shoulder joint  Localized edema     Problem List Patient Active Problem List   Diagnosis Date Noted  . Type 2 diabetes mellitus with other circulatory complications, HTN, CVD ([place]) 05/17/2020  . Morbid obesity ([place]) 05/17/2020  . BMI 40.0-44.9, adult ([place]) 05/17/2020  . OSA (obstructive sleep apnea) 06/10/2016  . Costochondritis 06/09/2016  . Bilateral leg cramps 02/07/2016  . Peripheral edema 01/29/2016  . Essential hypertension   . GERD (gastroesophageal reflux disease) 08/10/2014  . Menopausal syndrome 08/10/2014  . CAD S/P percutaneous coronary angioplasty - Ostial AVG Cx - Scoring balloon PTCA 10/03/2013  . Hyperlipidemia with target LDL less than 70 10/02/2013  . H/O non-ST elevation myocardial infarction (NSTEMI) 10/02/2013  . Normocytic anemia, not due to blood loss   . Allergic rhinitis 03/17/2007  . MIGRAINE, COMMON W/O INTRACTABLE MIGRAINE 03/16/2007    Scot Jun, PT, DPT, OCS, ATC 07/12/20  10:17 AM    [facility] Physical Therapy [address] [place], [name], [address] Phone: [phone]   Fax:  [phone]  Name: Kaiden Dardis MRN: [id number] Date of Birth: 1961/02/17

## 2020-07-15 ENCOUNTER — Encounter: Payer: 59 | Admitting: Rehabilitative and Restorative Service Providers"

## 2020-07-17 ENCOUNTER — Ambulatory Visit (INDEPENDENT_AMBULATORY_CARE_PROVIDER_SITE_OTHER): Payer: 59 | Admitting: Rehabilitative and Restorative Service Providers"

## 2020-07-17 ENCOUNTER — Other Ambulatory Visit: Payer: Self-pay

## 2020-07-17 DIAGNOSIS — R600 Localized edema: Secondary | ICD-10-CM

## 2020-07-17 DIAGNOSIS — M25511 Pain in right shoulder: Secondary | ICD-10-CM

## 2020-07-17 DIAGNOSIS — M6281 Muscle weakness (generalized): Secondary | ICD-10-CM | POA: Diagnosis not present

## 2020-07-17 DIAGNOSIS — M25611 Stiffness of right shoulder, not elsewhere classified: Secondary | ICD-10-CM | POA: Diagnosis not present

## 2020-07-17 DIAGNOSIS — G8929 Other chronic pain: Secondary | ICD-10-CM

## 2020-07-17 NOTE — Therapy (Signed)
Christine Barrett, [name], [address] Phone: [phone]   Fax:  [phone]  Physical Therapy Treatment  Patient Details  Name: Christine Barrett MRN: [id number] Date of Birth: 13-Sep-1960 Referring Provider (PT): Dr. [name]   Encounter Date: 07/17/2020   PT End of Session - 07/17/20 1609    Visit Number 4    Number of Visits 20    Date for PT Re-Evaluation 08/29/20    Progress Note Due on Visit 10    PT Start Time 0175    PT Stop Time 1638    PT Time Calculation (min) 54 min    Activity Tolerance Patient limited by pain    Behavior During Therapy [facility] for tasks assessed/performed           Past Medical History:  Diagnosis Date  . Allergy    SEASONAL  . Anemia   . CAD S/P percutaneous coronary angioplasty 09/2013   a) Ostial AV G Cx - 2.5 mm Angiosculpt; mid LAD 40-60%; b) Myoview 07/2014: LOW RISK, small-severe fixed inferior defect c/w infarct w/o peri-infarct ischemia.  . Chronic bronchitis ([place])    "frequently; not q yr" (10/03/2013)  . Diabetes mellitus without complication ([place])    Type 2-diet controlled.   . Diverticulosis   . Essential hypertension    with prior Accelerated HTN  . GERD (gastroesophageal reflux disease)   . Hiatal hernia   . Hx of non-ST elevation myocardial infarction (NSTEMI) 10/01/2013   Due to Accelerated HTN with existing CAD  . Hyperlipemia   . Migraine    "@ least once/month" (10/03/2013)  . OSA (obstructive sleep apnea) 06/10/2016  . Schatzki's ring   . Seasonal allergies   . Sinusitis   . Spinal headache    during C-section of second child.     Past Surgical History:  Procedure Laterality Date  . ABDOMINAL HYSTERECTOMY  1994   "partial"  . CAPSULAR RELEASE Right 06/06/2020   Procedure: RIGHT SHOULDER MANIPULATION UNDER ANESTHESIA, ROTATOR CUFF REPAIR;  Surgeon: Meredith Pel, MD;  Location: [place];  Service: Orthopedics;  Laterality: Right;  . CARDIAC CATHETERIZATION N/A 08/05/2015    Procedure: Left Heart Cath and Coronary Angiography;  Surgeon: [name], MD;  Location: [place] [facility];  Service: Cardiovascular;  Laterality: N/A;  . CESAREAN SECTION  1989  . COLONOSCOPY    . CORONARY ANGIOPLASTY  10/03/2013   95% ostial AV G Cx - 2.5 mm AngioSculpt Balloon PTCA; mid LAD 40-60%  . LEFT HEART CATHETERIZATION WITH CORONARY ANGIOGRAM N/A 10/02/2013   Procedure: LEFT HEART CATHETERIZATION WITH CORONARY ANGIOGRAM;  Surgeon: [name], MD;  Location: [facility] CATH LAB;  Service: Cardiovascular;  Laterality: N/A;  . NASAL SEPTOPLASTY W/ TURBINOPLASTY  ~ 2007  . NM MYOVIEW LTD  08/22/2014    Low risk stress nuclear study with a small, severe, fixed defect in the distal inferior wall/apex suggestive of small prior infarct; no ischemia.  LV Wall Motion: NL LV Function; NL Wall Motion  . PERCUTANEOUS CORONARY STENT INTERVENTION (PCI-S) N/A 10/03/2013   cutting balloon angioplasty only no stent.   . TRANSTHORACIC ECHOCARDIOGRAM  10/02/2013   EF 55-60%; mild LVH, no RWMA,   . TUBAL LIGATION  1989    There were no vitals filed for this visit.   Subjective Assessment - 07/17/20 1542    Subjective Patient states her pain is minimal only 2/10 with some light soreness but overall progressing well.  [name] Adult PT Treatment/Exercise - 07/17/20 1606      Shoulder Exercises: Supine   Other Supine Exercises supine want flexion, abduction, er  to tolerance x 5 each    Other Supine Exercises AAROM cane flexion, abd, ER      Shoulder Exercises: Standing   Other Standing Exercises pendulums CW and CCW 10 ea    Other Standing Exercises 5 sec isometric hold ER, IR, flex, abd 10x each      Vasopneumatic   Number Minutes Vasopneumatic  10 minutes    Vasopnuematic Location  Shoulder    Vasopneumatic Pressure Medium    Vasopneumatic Temperature  34      Manual Therapy   Manual Therapy Passive ROM    Manual therapy comments PROM flexion,  adb, IR, ER 10 mins, AP g2-3 mobs AP and inf                    PT Short Term Goals - 07/12/20 1017      PT SHORT TERM GOAL #1   Title Patient will demonstrate independent use of home exercise program to maintain progress from in clinic treatments.    Time 3    Period Weeks    Status On-going    Target Date 07/11/20             PT Long Term Goals - 06/20/20 1107      PT LONG TERM GOAL #1   Title Patient will demonstrate/report pain at worst less than or equal to 2/10 to facilitate minimal limitation in daily activity secondary to pain symptoms.    Time 10    Period Weeks    Status New    Target Date 08/29/20      PT LONG TERM GOAL #2   Title Patient will demonstrate independent use of home exercise program to facilitate ability to maintain/progress functional gains from skilled physical therapy services.    Time 10    Period Weeks    Status New    Target Date 08/29/20      PT LONG TERM GOAL #3   Title Patient will demonstrate return to work/recreational activity at previous level of function without limitations secondary due to condition    Time 10    Period Weeks    Status New    Target Date 08/29/20      PT LONG TERM GOAL #4   Title Pt. will demonstrate Rt shoulder AROM WFL s symptoms to facilitate normal daily use and reaching at PLOF.    Time 10    Period Weeks    Status New    Target Date 08/29/20      PT LONG TERM GOAL #5   Title Pt. will demonstrate Rt UE MMT 5/5 throughout to facilitate ability to perform daily lifting, carrying house activity at PLOF.    Time 10    Period Weeks    Status New    Target Date 08/29/20                 Plan - 07/17/20 1611    Clinical Impression Statement Patient presented with minimal pain in R shoulder today with motion still limited by pain. Progressed to add in more focus on AAROM and muscle activation with isometrics. Patient tolerated tx well but still limited by pain and muscle weakness. She will  benefit from gradual strengthening within her available range and will eventually progress her ROM to be more functional for ADLs. Patient also  mentioned she got an at home TENS unit, ask about this next visit for set up.    Personal Factors and Comorbidities Comorbidity 3+    Comorbidities DM, GERD, HTN, hyperlipidemia    Examination-Activity Limitations Bathing;Bed Mobility;Sleep;Sit;Carry;Toileting;Dressing;Hygiene/Grooming;Lift;Reach Overhead    Examination-Participation Restrictions Occupation;Community Activity;Yard Work;Driving;Cleaning;Meal Prep    Stability/Clinical Decision Making Stable/Uncomplicated    Rehab Potential Good    PT Frequency 2x / week    PT Duration Other (comment)   10 weeks   PT Treatment/Interventions ADLs/Self Care Home Management;Electrical Stimulation;Cryotherapy;Iontophoresis 4mg /ml Dexamethasone;Moist Heat;Balance training;Therapeutic exercise;Therapeutic activities;Functional mobility training;Gait training;DME Instruction;Ultrasound;Neuromuscular re-education;Patient/family education;Passive range of motion;Spinal Manipulations;Joint Manipulations;Dry needling;Taping;Vasopneumatic Device;Manual techniques    PT Next Visit Plan TENS home unit help? start on pulleys more AAROM focus and progress isometrics with cueing    PT Home Exercise Plan [email or web address]    Consulted and Agree with Plan of Care Patient           Patient will benefit from skilled therapeutic intervention in order to improve the following deficits and impairments:     Visit Diagnosis: Chronic right shoulder pain  Muscle weakness (generalized)  Stiffness of right shoulder joint  Localized edema     Problem List Patient Active Problem List   Diagnosis Date Noted  . Type 2 diabetes mellitus with other circulatory complications, HTN, CVD ([place]) 05/17/2020  . Morbid obesity ([place]) 05/17/2020  . BMI 40.0-44.9, adult ([place]) 05/17/2020  . OSA (obstructive sleep apnea) 06/10/2016  .  Costochondritis 06/09/2016  . Bilateral leg cramps 02/07/2016  . Peripheral edema 01/29/2016  . Essential hypertension   . GERD (gastroesophageal reflux disease) 08/10/2014  . Menopausal syndrome 08/10/2014  . CAD S/P percutaneous coronary angioplasty - Ostial AVG Cx - Scoring balloon PTCA 10/03/2013  . Hyperlipidemia with target LDL less than 70 10/02/2013  . H/O non-ST elevation myocardial infarction (NSTEMI) 10/02/2013  . Normocytic anemia, not due to blood loss   . Allergic rhinitis 03/17/2007  . MIGRAINE, COMMON W/O INTRACTABLE MIGRAINE 03/16/2007    Glenetta Hew, SPT 07/17/2020, 4:37 PM  Scot Jun, PT, DPT, OCS, ATC 07/18/20  8:01 AM     [facility] Physical Therapy [address] [place], [name], [address] Phone: [phone]   Fax:  [phone]  Name: Christine Barrett MRN: [id number] Date of Birth: 18-Jan-1961

## 2020-07-18 ENCOUNTER — Encounter: Payer: Self-pay | Admitting: Rehabilitative and Restorative Service Providers"

## 2020-07-23 ENCOUNTER — Ambulatory Visit (INDEPENDENT_AMBULATORY_CARE_PROVIDER_SITE_OTHER): Payer: 59 | Admitting: Rehabilitative and Restorative Service Providers"

## 2020-07-23 ENCOUNTER — Other Ambulatory Visit: Payer: Self-pay

## 2020-07-23 DIAGNOSIS — M25511 Pain in right shoulder: Secondary | ICD-10-CM

## 2020-07-23 DIAGNOSIS — M25611 Stiffness of right shoulder, not elsewhere classified: Secondary | ICD-10-CM | POA: Diagnosis not present

## 2020-07-23 DIAGNOSIS — M6281 Muscle weakness (generalized): Secondary | ICD-10-CM

## 2020-07-23 DIAGNOSIS — R600 Localized edema: Secondary | ICD-10-CM

## 2020-07-23 NOTE — Therapy (Signed)
[place], [name], [address] Phone: [phone]   Fax:  [phone]  Physical Therapy Treatment  Patient Details  Name: Christine Barrett MRN: [id number] Date of Birth: [birth year] Referring Provider (PT): Dr. [name]   Encounter Date: 07/23/2020   PT End of Session - 07/23/20 1640    Visit Number 5    Number of Visits 20    Date for PT Re-Evaluation 08/29/20    Progress Note Due on Visit 10    PT Start Time 1558    PT Stop Time 1647    PT Time Calculation (min) 49 min    Activity Tolerance Patient limited by pain    Behavior During Therapy [facility] for tasks assessed/performed           Past Medical History:  Diagnosis Date  . Allergy    SEASONAL  . Anemia   . CAD S/P percutaneous coronary angioplasty 09/2013   a) Ostial AV G Cx - 2.5 mm Angiosculpt; mid LAD 40-60%; b) Myoview 07/2014: LOW RISK, small-severe fixed inferior defect c/w infarct w/o peri-infarct ischemia.  . Chronic bronchitis ([place])    "frequently; not q yr" (10/03/2013)  . Diabetes mellitus without complication ([place])    Type 2-diet controlled.   . Diverticulosis   . Essential hypertension    with prior Accelerated HTN  . GERD (gastroesophageal reflux disease)   . Hiatal hernia   . Hx of non-ST elevation myocardial infarction (NSTEMI) 10/01/2013   Due to Accelerated HTN with existing CAD  . Hyperlipemia   . Migraine    "@ least once/month" (10/03/2013)  . OSA (obstructive sleep apnea) 06/10/2016  . Schatzki's ring   . Seasonal allergies   . Sinusitis   . Spinal headache    during C-section of second child.     Past Surgical History:  Procedure Laterality Date  . ABDOMINAL HYSTERECTOMY  1994   "partial"  . CAPSULAR RELEASE Right 06/06/2020   Procedure: RIGHT SHOULDER MANIPULATION UNDER ANESTHESIA, ROTATOR CUFF REPAIR;  Surgeon: Meredith Pel, MD;  Location: [facility];  Service: Orthopedics;  Laterality: Right;  . CARDIAC CATHETERIZATION N/A 08/05/2015    Procedure: Left Heart Cath and Coronary Angiography;  Surgeon: [name], MD;  Location: [place] [facility];  Service: Cardiovascular;  Laterality: N/A;  . CESAREAN SECTION  1989  . COLONOSCOPY    . CORONARY ANGIOPLASTY  10/03/2013   95% ostial AV G Cx - 2.5 mm AngioSculpt Balloon PTCA; mid LAD 40-60%  . LEFT HEART CATHETERIZATION WITH CORONARY ANGIOGRAM N/A 10/02/2013   Procedure: LEFT HEART CATHETERIZATION WITH CORONARY ANGIOGRAM;  Surgeon: [name], MD;  Location[facility] CATH LAB;  Service: Cardiovascular;  Laterality: N/A;  . NASAL SEPTOPLASTY W/ TURBINOPLASTY  ~ 2007  . NM MYOVIEW LTD  08/22/2014    Low risk stress nuclear study with a small, severe, fixed defect in the distal inferior wall/apex suggestive of small prior infarct; no ischemia.  LV Wall Motion: NL LV Function; NL Wall Motion  . PERCUTANEOUS CORONARY STENT INTERVENTION (PCI-S) N/A 10/03/2013   cutting balloon angioplasty only no stent.   . TRANSTHORACIC ECHOCARDIOGRAM  10/02/2013   EF 55-60%; mild LVH, no RWMA,   . TUBAL LIGATION  1989    There were no vitals filed for this visit.   Subjective Assessment - 07/23/20 1637    Subjective Patient reports she is having 8/10 pain and feels she may have overdone it in the past few days. She has been  using her machine at home 3 times a day and has been doing her mobility exercises but she will not be able to continue doing this while working. She did drive for the first time today and felt slightly apprehensive about it but was able to do it.    Pain Score 8     Pain Location Shoulder    Pain Orientation Right    Pain Descriptors / Indicators Aching;Tightness;Sharp    Pain Type Surgical pain    Pain Onset More than a month ago    Pain Frequency Intermittent                             [facility] Adult PT Treatment/Exercise - 07/23/20 0001      Shoulder Exercises: Seated   Other Seated Exercises seated table slides flexion, scaption x 10 c instruction       Shoulder Exercises: Standing   Other Standing Exercises pendulums CW and CCW 10 ea      Electrical Stimulation   Electrical Stimulation Location Rt shoulder    Electrical Stimulation Parameters 15 mins to tolerance    Electrical Stimulation Goals Pain      Vasopneumatic   Number Minutes Vasopneumatic  10 minutes    Vasopnuematic Location  Shoulder    Vasopneumatic Pressure Medium    Vasopneumatic Temperature  34      Manual Therapy   Manual Therapy Passive ROM    Manual therapy comments PROM flexion, adb, IR, ER 10 mins, AP g1-2 mobs AP and inf                    PT Short Term Goals - 07/12/20 1017      PT SHORT TERM GOAL #1   Title Patient will demonstrate independent use of home exercise program to maintain progress from in clinic treatments.    Time 3    Period Weeks    Status On-going    Target Date 07/11/20             PT Long Term Goals - 06/20/20 1107      PT LONG TERM GOAL #1   Title Patient will demonstrate/report pain at worst less than or equal to 2/10 to facilitate minimal limitation in daily activity secondary to pain symptoms.    Time 10    Period Weeks    Status New    Target Date 08/29/20      PT LONG TERM GOAL #2   Title Patient will demonstrate independent use of home exercise program to facilitate ability to maintain/progress functional gains from skilled physical therapy services.    Time 10    Period Weeks    Status New    Target Date 08/29/20      PT LONG TERM GOAL #3   Title Patient will demonstrate return to work/recreational activity at previous level of function without limitations secondary due to condition    Time 10    Period Weeks    Status New    Target Date 08/29/20      PT LONG TERM GOAL #4   Title Pt. will demonstrate Rt shoulder AROM WFL s symptoms to facilitate normal daily use and reaching at PLOF.    Time 10    Period Weeks    Status New    Target Date 08/29/20      PT LONG TERM GOAL #5   Title Pt.  will demonstrate  Rt UE MMT 5/5 throughout to facilitate ability to perform daily lifting, carrying house activity at PLOF.    Time 10    Period Weeks    Status New    Target Date 08/29/20                 Plan - 07/23/20 1640    Clinical Impression Statement Patient presented with increased pain 8/10 today which guided our focus for today's treatment towards pain control. Utilized TENS for pain modulation during PROM and joint mobilizations, patient tolerated this relatively well with no increase in pain with this. AAROM pendulums and table slides for flex/abd did flare up the pain slightly before vaso treatment. Overall patient notes a decrease in pain to 6/10 post tx. Advised patient to avoid overstraining and overworking it to try to avoid high pain flare ups. Patient is able to utilize TENS unit wit home device for pain control as well. Patient can benefit from continued PT intervention to increase her mobility and decrease pain before moving more towards building her strength.    Personal Factors and Comorbidities Comorbidity 3+    Comorbidities DM, GERD, HTN, hyperlipidemia    Examination-Activity Limitations Bathing;Bed Mobility;Sleep;Sit;Carry;Toileting;Dressing;Hygiene/Grooming;Lift;Reach Overhead    Examination-Participation Restrictions Occupation;Community Activity;Yard Work;Driving;Cleaning;Meal Prep    Stability/Clinical Decision Making Stable/Uncomplicated    Rehab Potential Good    PT Frequency 2x / week    PT Duration Other (comment)   10 weeks   PT Treatment/Interventions ADLs/Self Care Home Management;Electrical Stimulation;Cryotherapy;Iontophoresis 4mg /ml Dexamethasone;Moist Heat;Balance training;Therapeutic exercise;Therapeutic activities;Functional mobility training;Gait training;DME Instruction;Ultrasound;Neuromuscular re-education;Patient/family education;Passive range of motion;Spinal Manipulations;Joint Manipulations;Dry needling;Taping;Vasopneumatic Device;Manual  techniques    PT Next Visit Plan check in with pain and see if we can shift focus to more active motion and restarting isometrics    PT Home Exercise Plan [email or web address]    Consulted and Agree with Plan of Care Patient           Patient will benefit from skilled therapeutic intervention in order to improve the following deficits and impairments:  Decreased activity tolerance,Impaired flexibility,Increased edema,Decreased strength,Decreased mobility,Impaired UE functional use  Visit Diagnosis: Muscle weakness (generalized)  Stiffness of right shoulder joint  Localized edema  Acute pain of right shoulder     Problem List Patient Active Problem List   Diagnosis Date Noted  . Type 2 diabetes mellitus with other circulatory complications, HTN, CVD ([place]) 05/17/2020  . Morbid obesity ([place]) 05/17/2020  . BMI 40.0-44.9, adult ([place]) 05/17/2020  . OSA (obstructive sleep apnea) 06/10/2016  . Costochondritis 06/09/2016  . Bilateral leg cramps 02/07/2016  . Peripheral edema 01/29/2016  . Essential hypertension   . GERD (gastroesophageal reflux disease) 08/10/2014  . Menopausal syndrome 08/10/2014  . CAD S/P percutaneous coronary angioplasty - Ostial AVG Cx - Scoring balloon PTCA 10/03/2013  . Hyperlipidemia with target LDL less than 70 10/02/2013  . H/O non-ST elevation myocardial infarction (NSTEMI) 10/02/2013  . Normocytic anemia, not due to blood loss   . Allergic rhinitis 03/17/2007  . MIGRAINE, COMMON W/O INTRACTABLE MIGRAINE 03/16/2007   Glenetta Hew, Student PT 07/23/2020   Scot Jun, PT, DPT, OCS, ATC 07/24/20  7:58 AM    [facility] Physical Therapy [address] [place], [name], [address] Phone: [phone]   Fax:  [phone]  Name: Christine Barrett MRN: [id number] Date of Birth: May 14, 1961

## 2020-07-24 ENCOUNTER — Ambulatory Visit (INDEPENDENT_AMBULATORY_CARE_PROVIDER_SITE_OTHER): Payer: 59 | Admitting: Orthopedic Surgery

## 2020-07-24 ENCOUNTER — Encounter: Payer: Self-pay | Admitting: Rehabilitative and Restorative Service Providers"

## 2020-07-24 DIAGNOSIS — M75101 Unspecified rotator cuff tear or rupture of right shoulder, not specified as traumatic: Secondary | ICD-10-CM

## 2020-07-24 MED ORDER — METHOCARBAMOL 500 MG PO TABS: 500.0000 mg | ORAL_TABLET | Freq: Three times a day (TID) | ORAL | 0 refills | Status: DC | PRN

## 2020-07-25 ENCOUNTER — Encounter: Payer: Self-pay | Admitting: Orthopedic Surgery

## 2020-07-25 ENCOUNTER — Other Ambulatory Visit: Payer: Self-pay

## 2020-07-25 ENCOUNTER — Ambulatory Visit (INDEPENDENT_AMBULATORY_CARE_PROVIDER_SITE_OTHER): Payer: 59 | Admitting: Rehabilitative and Restorative Service Providers"

## 2020-07-25 DIAGNOSIS — R600 Localized edema: Secondary | ICD-10-CM

## 2020-07-25 DIAGNOSIS — M25511 Pain in right shoulder: Secondary | ICD-10-CM

## 2020-07-25 DIAGNOSIS — M25611 Stiffness of right shoulder, not elsewhere classified: Secondary | ICD-10-CM

## 2020-07-25 DIAGNOSIS — M6281 Muscle weakness (generalized): Secondary | ICD-10-CM | POA: Diagnosis not present

## 2020-07-25 NOTE — Progress Notes (Signed)
Post-Op Visit Note   Patient: Christine Barrett           Date of Birth: [birth year]           MRN: [id number] Visit Date: 07/24/2020 PCP: Jinny Sanders, MD   Assessment & Plan:  Chief Complaint:  Chief Complaint  Patient presents with  . Other     Post op follow up   Visit Diagnoses:  1. Tear of right supraspinatus tendon     Plan: [name] is a 60 year old patient who underwent right shoulder manipulation under anesthesia and rotator cuff tear repair 06/06/2020.  Therapy twice a week.  Home exercise program also.  Occasional sharp pains down the arm.  She drove today.  She works as an Journalist, newspaper.  TENS unit helps.  On exam she has improving range of motion with external rotation of 15 degrees of abduction to about 35 degrees.  Isolated glenohumeral forward flexion just above 90 isolated glenohumeral abduction about 85.  All in all I think she looks good but she needs to continue with 2 more months of physical therapy to really work on achieving more range of motion.  Also suggested getting a door pulley.  Muscle relaxer refilled.  6-week return for clinical recheck.  Follow-Up Instructions: Return in about 6 weeks (around 09/04/2020).   Orders:  No orders of the defined types were placed in this encounter.  Meds ordered this encounter  Medications  . methocarbamol (ROBAXIN) 500 MG tablet    Sig: Take 1 tablet (500 mg total) by mouth every 8 (eight) hours as needed.    Dispense:  30 tablet    Refill:  0    Imaging: No results found.  PMFS History: Patient Active Problem List   Diagnosis Date Noted  . Type 2 diabetes mellitus with other circulatory complications, HTN, CVD ([place]) 05/17/2020  . Morbid obesity ([place]) 05/17/2020  . BMI 40.0-44.9, adult ([place]) 05/17/2020  . OSA (obstructive sleep apnea) 06/10/2016  . Costochondritis 06/09/2016  . Bilateral leg cramps 02/07/2016  . Peripheral edema 01/29/2016  . Essential hypertension   . GERD (gastroesophageal reflux  disease) 08/10/2014  . Menopausal syndrome 08/10/2014  . CAD S/P percutaneous coronary angioplasty - Ostial AVG Cx - Scoring balloon PTCA 10/03/2013  . Hyperlipidemia with target LDL less than 70 10/02/2013  . H/O non-ST elevation myocardial infarction (NSTEMI) 10/02/2013  . Normocytic anemia, not due to blood loss   . Allergic rhinitis 03/17/2007  . MIGRAINE, COMMON W/O INTRACTABLE MIGRAINE 03/16/2007   Past Medical History:  Diagnosis Date  . Allergy    SEASONAL  . Anemia   . CAD S/P percutaneous coronary angioplasty 09/2013   a) Ostial AV G Cx - 2.5 mm Angiosculpt; mid LAD 40-60%; b) Myoview 07/2014: LOW RISK, small-severe fixed inferior defect c/w infarct w/o peri-infarct ischemia.  . Chronic bronchitis ([place])    "frequently; not q yr" (10/03/2013)  . Diabetes mellitus without complication ([place])    Type 2-diet controlled.   . Diverticulosis   . Essential hypertension    with prior Accelerated HTN  . GERD (gastroesophageal reflux disease)   . Hiatal hernia   . Hx of non-ST elevation myocardial infarction (NSTEMI) 10/01/2013   Due to Accelerated HTN with existing CAD  . Hyperlipemia   . Migraine    "@ least once/month" (10/03/2013)  . OSA (obstructive sleep apnea) 06/10/2016  . Schatzki's ring   . Seasonal allergies   . Sinusitis   . Spinal headache  during C-section of second child.     Family History  Adopted: Yes  Problem Relation Age of Onset  . Diabetes Mother   . Breast cancer Sister   . Hypertension Sister   . Colon cancer Neg Hx   . Heart attack Neg Hx   . Stroke Neg Hx     Past Surgical History:  Procedure Laterality Date  . ABDOMINAL HYSTERECTOMY  1994   "partial"  . CAPSULAR RELEASE Right 06/06/2020   Procedure: RIGHT SHOULDER MANIPULATION UNDER ANESTHESIA, ROTATOR CUFF REPAIR;  Surgeon: Meredith Pel, MD;  Location: [place];  Service: Orthopedics;  Laterality: Right;  . CARDIAC CATHETERIZATION N/A 08/05/2015   Procedure: Left Heart Cath and Coronary  Angiography;  Surgeon: [name], MD;  Location: [place] CV LAB;  Service: Cardiovascular;  Laterality: N/A;  . CESAREAN SECTION  1989  . COLONOSCOPY    . CORONARY ANGIOPLASTY  10/03/2013   95% ostial AV G Cx - 2.5 mm AngioSculpt Balloon PTCA; mid LAD 40-60%  . LEFT HEART CATHETERIZATION WITH CORONARY ANGIOGRAM N/A 10/02/2013   Procedure: LEFT HEART CATHETERIZATION WITH CORONARY ANGIOGRAM;  Surgeon: [name], MD;  Location: [facility] CATH LAB;  Service: Cardiovascular;  Laterality: N/A;  . NASAL SEPTOPLASTY W/ TURBINOPLASTY  ~ 2007  . NM MYOVIEW LTD  08/22/2014    Low risk stress nuclear study with a small, severe, fixed defect in the distal inferior wall/apex suggestive of small prior infarct; no ischemia.  LV Wall Motion: NL LV Function; NL Wall Motion  . PERCUTANEOUS CORONARY STENT INTERVENTION (PCI-S) N/A 10/03/2013   cutting balloon angioplasty only no stent.   . TRANSTHORACIC ECHOCARDIOGRAM  10/02/2013   EF 55-60%; mild LVH, no RWMA,   . TUBAL LIGATION  1989   Social History   Occupational History  . Occupation: Research scientist (life sciences)  Tobacco Use  . Smoking status: Never Smoker  . Smokeless tobacco: Never Used  Vaping Use  . Vaping Use: Never used  Substance and Sexual Activity  . Alcohol use: Yes    Alcohol/week: 0.0 standard drinks    Comment: occ  . Drug use: No  . Sexual activity: Yes    Birth control/protection: Surgical

## 2020-07-25 NOTE — Therapy (Signed)
[place], [name], [address] Phone: [phone]   Fax:  [phone]  Physical Therapy Treatment  Patient Details  Name: Christine Barrett MRN: [id number] Date of Birth: [birth year] Referring Provider (PT): Dr. [name]   Encounter Date: 07/25/2020   PT End of Session - 07/25/20 1640    Visit Number 6    Number of Visits 20    Date for PT Re-Evaluation 08/29/20    Progress Note Due on Visit 10    PT Start Time 5427    PT Stop Time 0623    PT Time Calculation (min) 48 min    Activity Tolerance Patient limited by pain;Patient tolerated treatment well    Behavior During Therapy [facility] for tasks assessed/performed           Past Medical History:  Diagnosis Date  . Allergy    SEASONAL  . Anemia   . CAD S/P percutaneous coronary angioplasty 09/2013   a) Ostial AV G Cx - 2.5 mm Angiosculpt; mid LAD 40-60%; b) Myoview 07/2014: LOW RISK, small-severe fixed inferior defect c/w infarct w/o peri-infarct ischemia.  . Chronic bronchitis ([place])    "frequently; not q yr" (10/03/2013)  . Diabetes mellitus without complication ([place])    Type 2-diet controlled.   . Diverticulosis   . Essential hypertension    with prior Accelerated HTN  . GERD (gastroesophageal reflux disease)   . Hiatal hernia   . Hx of non-ST elevation myocardial infarction (NSTEMI) 10/01/2013   Due to Accelerated HTN with existing CAD  . Hyperlipemia   . Migraine    "@ least once/month" (10/03/2013)  . OSA (obstructive sleep apnea) 06/10/2016  . Schatzki's ring   . Seasonal allergies   . Sinusitis   . Spinal headache    during C-section of second child.     Past Surgical History:  Procedure Laterality Date  . ABDOMINAL HYSTERECTOMY  1994   "partial"  . CAPSULAR RELEASE Right 06/06/2020   Procedure: RIGHT SHOULDER MANIPULATION UNDER ANESTHESIA, ROTATOR CUFF REPAIR;  Surgeon: Meredith Pel, MD;  Location: [place];  Service: Orthopedics;  Laterality: Right;  .  CARDIAC CATHETERIZATION N/A 08/05/2015   Procedure: Left Heart Cath and Coronary Angiography;  Surgeon: [name], MD;  Location: [facility];  Service: Cardiovascular;  Laterality: N/A;  . CESAREAN SECTION  1989  . COLONOSCOPY    . CORONARY ANGIOPLASTY  10/03/2013   95% ostial AV G Cx - 2.5 mm AngioSculpt Balloon PTCA; mid LAD 40-60%  . LEFT HEART CATHETERIZATION WITH CORONARY ANGIOGRAM N/A 10/02/2013   Procedure: LEFT HEART CATHETERIZATION WITH CORONARY ANGIOGRAM;  Surgeon: [name], MD;  [facility] CATH LAB;  Service: Cardiovascular;  Laterality: N/A;  . NASAL SEPTOPLASTY W/ TURBINOPLASTY  ~ 2007  . NM MYOVIEW LTD  08/22/2014    Low risk stress nuclear study with a small, severe, fixed defect in the distal inferior wall/apex suggestive of small prior infarct; no ischemia.  LV Wall Motion: NL LV Function; NL Wall Motion  . PERCUTANEOUS CORONARY STENT INTERVENTION (PCI-S) N/A 10/03/2013   cutting balloon angioplasty only no stent.   . TRANSTHORACIC ECHOCARDIOGRAM  10/02/2013   EF 55-60%; mild LVH, no RWMA,   . TUBAL LIGATION  1989    There were no vitals filed for this visit.   Subjective Assessment - 07/25/20 1557    Subjective 3/10 pain today and reports she is feeling much better than last visit and mostly just soreness. She saw  the Dr. and he relays she is doing well and can continue mobility and start strengthening.    Currently in Pain? Yes    Pain Score 3     Pain Location Shoulder    Pain Orientation Right    Pain Onset More than a month ago                             [facility] Adult PT Treatment/Exercise - 07/25/20 0001      Shoulder Exercises: Seated   Other Seated Exercises seated table slides scaption x5 stopped due to pain      Shoulder Exercises: Standing   Other Standing Exercises wall ladder 15x. isometrics 5 sec hold flex, abd, ext   pain had pain with scaption wall ladder switched to table slides     Shoulder Exercises: Pulleys    Flexion 2 minutes    Scaption 2 minutes      Vasopneumatic   Number Minutes Vasopneumatic  10 minutes    Vasopnuematic Location  Shoulder    Vasopneumatic Pressure Medium    Vasopneumatic Temperature  34      Manual Therapy   Manual Therapy Passive ROM    Manual therapy comments PROM flexion, adb 10 mins, AP g2-3 mobs AP and inf, distraction                    PT Short Term Goals - 07/12/20 1017      PT SHORT TERM GOAL #1   Title Patient will demonstrate independent use of home exercise program to maintain progress from in clinic treatments.    Time 3    Period Weeks    Status On-going    Target Date 07/11/20             PT Long Term Goals - 06/20/20 1107      PT LONG TERM GOAL #1   Title Patient will demonstrate/report pain at worst less than or equal to 2/10 to facilitate minimal limitation in daily activity secondary to pain symptoms.    Time 10    Period Weeks    Status New    Target Date 08/29/20      PT LONG TERM GOAL #2   Title Patient will demonstrate independent use of home exercise program to facilitate ability to maintain/progress functional gains from skilled physical therapy services.    Time 10    Period Weeks    Status New    Target Date 08/29/20      PT LONG TERM GOAL #3   Title Patient will demonstrate return to work/recreational activity at previous level of function without limitations secondary due to condition    Time 10    Period Weeks    Status New    Target Date 08/29/20      PT LONG TERM GOAL #4   Title Pt. will demonstrate Rt shoulder AROM WFL s symptoms to facilitate normal daily use and reaching at PLOF.    Time 10    Period Weeks    Status New    Target Date 08/29/20      PT LONG TERM GOAL #5   Title Pt. will demonstrate Rt UE MMT 5/5 throughout to facilitate ability to perform daily lifting, carrying house activity at PLOF.    Time 10    Period Weeks    Status New    Target Date 08/29/20  Plan - 07/25/20 1634    Clinical Impression Statement Patient had decreased pain since last visit, started with more AAROM on pulleys and wall/table slides to decrease muscle guarding with PROM. Patient tolerated well with some sharp pain limiting scaption with wall and table slides. Focused more on flexion and abduction mobility and added back in a few planes of motion isometrics to reintroduce strengthening. Patient was having some increased pain that limited treatment today. Reassured the patient that setbacks are normal in therapy and that continued progress will come with time.    Personal Factors and Comorbidities Comorbidity 3+    Comorbidities DM, GERD, HTN, hyperlipidemia    Examination-Activity Limitations Bathing;Bed Mobility;Sleep;Sit;Carry;Toileting;Dressing;Hygiene/Grooming;Lift;Reach Overhead    Examination-Participation Restrictions Occupation;Community Activity;Yard Work;Driving;Cleaning;Meal Prep    Stability/Clinical Decision Making Stable/Uncomplicated    Rehab Potential Good    PT Frequency 2x / week    PT Duration Other (comment)   10 weeks   PT Treatment/Interventions ADLs/Self Care Home Management;Electrical Stimulation;Cryotherapy;Iontophoresis 4mg /ml Dexamethasone;Moist Heat;Balance training;Therapeutic exercise;Therapeutic activities;Functional mobility training;Gait training;DME Instruction;Ultrasound;Neuromuscular re-education;Patient/family education;Passive range of motion;Spinal Manipulations;Joint Manipulations;Dry needling;Taping;Vasopneumatic Device;Manual techniques    PT Next Visit Plan restart with AAROM see if she can tolerate more scaption motion without sharp pain, cane exercises    PT Home Exercise Plan [id number]    Consulted and Agree with Plan of Care Patient           Patient will benefit from skilled therapeutic intervention in order to improve the following deficits and impairments:  Decreased activity tolerance,Impaired flexibility,Increased  edema,Decreased strength,Decreased mobility,Impaired UE functional use  Visit Diagnosis: Muscle weakness (generalized)  Stiffness of right shoulder joint  Localized edema  Acute pain of right shoulder     Problem List Patient Active Problem List   Diagnosis Date Noted  . Type 2 diabetes mellitus with other circulatory complications, HTN, CVD ([place]) 05/17/2020  . Morbid obesity ([place]) 05/17/2020  . BMI 40.0-44.9, adult ([place]) 05/17/2020  . OSA (obstructive sleep apnea) 06/10/2016  . Costochondritis 06/09/2016  . Bilateral leg cramps 02/07/2016  . Peripheral edema 01/29/2016  . Essential hypertension   . GERD (gastroesophageal reflux disease) 08/10/2014  . Menopausal syndrome 08/10/2014  . CAD S/P percutaneous coronary angioplasty - Ostial AVG Cx - Scoring balloon PTCA 10/03/2013  . Hyperlipidemia with target LDL less than 70 10/02/2013  . H/O non-ST elevation myocardial infarction (NSTEMI) 10/02/2013  . Normocytic anemia, not due to blood loss   . Allergic rhinitis 03/17/2007  . MIGRAINE, COMMON W/O INTRACTABLE MIGRAINE 03/16/2007   Glenetta Hew, SPT 07/25/2020, 4:44pm  [facility] Physical Therapy [address] [place], [name], [address] Phone: [phone]   Fax:  [phone]  Name: Christine Barrett MRN: [id number] Date of Birth: 13-Jan-1961

## 2020-07-26 ENCOUNTER — Encounter: Payer: Self-pay | Admitting: Rehabilitative and Restorative Service Providers"

## 2020-07-30 ENCOUNTER — Encounter: Payer: 59 | Admitting: Physical Therapy

## 2020-08-05 ENCOUNTER — Ambulatory Visit: Payer: 59 | Admitting: Internal Medicine

## 2020-08-05 ENCOUNTER — Ambulatory Visit (INDEPENDENT_AMBULATORY_CARE_PROVIDER_SITE_OTHER): Payer: 59 | Admitting: Rehabilitative and Restorative Service Providers"

## 2020-08-05 ENCOUNTER — Other Ambulatory Visit: Payer: Self-pay

## 2020-08-05 ENCOUNTER — Telehealth: Payer: Self-pay | Admitting: Family Medicine

## 2020-08-05 DIAGNOSIS — M25611 Stiffness of right shoulder, not elsewhere classified: Secondary | ICD-10-CM | POA: Diagnosis not present

## 2020-08-05 DIAGNOSIS — M6281 Muscle weakness (generalized): Secondary | ICD-10-CM

## 2020-08-05 DIAGNOSIS — R600 Localized edema: Secondary | ICD-10-CM

## 2020-08-05 DIAGNOSIS — M25511 Pain in right shoulder: Secondary | ICD-10-CM | POA: Diagnosis not present

## 2020-08-05 DIAGNOSIS — D649 Anemia, unspecified: Secondary | ICD-10-CM

## 2020-08-05 DIAGNOSIS — E1159 Type 2 diabetes mellitus with other circulatory complications: Secondary | ICD-10-CM

## 2020-08-05 DIAGNOSIS — G8929 Other chronic pain: Secondary | ICD-10-CM

## 2020-08-05 DIAGNOSIS — E785 Hyperlipidemia, unspecified: Secondary | ICD-10-CM

## 2020-08-05 NOTE — Telephone Encounter (Signed)
-----   Message from Cloyd Stagers, RT sent at 07/29/2020 11:17 AM EST ----- Regarding: Lab Orders for Tuesday 2.15.2022 Please place lab orders for Tuesday 2.15.2022, office visit for 3 month f/u on Tuesday 2.22.2022 Thank you, Dyke Maes RT(R)

## 2020-08-05 NOTE — Therapy (Signed)
[place], [name], [address] Phone: [phone]   Fax:  [phone]  Physical Therapy Treatment  Patient Details  Name: Christine Barrett MRN: [id number] Date of Birth: [birth year] Referring Provider (PT): Dr. [name]   Encounter Date: 08/05/2020   PT End of Session - 08/05/20 1642    Visit Number 7    Number of Visits 20    Date for PT Re-Evaluation 08/29/20    Progress Note Due on Visit 10    PT Start Time [id number]    PT Stop Time [id number]    PT Time Calculation (min) 49 min    Activity Tolerance Patient limited by pain;Patient tolerated treatment well    Behavior During Therapy [facility] for tasks assessed/performed           Past Medical History:  Diagnosis Date  . Allergy    SEASONAL  . Anemia   . CAD S/P percutaneous coronary angioplasty 09/2013   a) Ostial AV G Cx - 2.5 mm Angiosculpt; mid LAD 40-60%; b) Myoview 07/2014: LOW RISK, small-severe fixed inferior defect c/w infarct w/o peri-infarct ischemia.  . Chronic bronchitis ([place])    "frequently; not q yr" (10/03/2013)  . Diabetes mellitus without complication ([place])    Type 2-diet controlled.   . Diverticulosis   . Essential hypertension    with prior Accelerated HTN  . GERD (gastroesophageal reflux disease)   . Hiatal hernia   . Hx of non-ST elevation myocardial infarction (NSTEMI) 10/01/2013   Due to Accelerated HTN with existing CAD  . Hyperlipemia   . Migraine    "@ least once/month" (10/03/2013)  . OSA (obstructive sleep apnea) 06/10/2016  . Schatzki's ring   . Seasonal allergies   . Sinusitis   . Spinal headache    during C-section of second child.     Past Surgical History:  Procedure Laterality Date  . ABDOMINAL HYSTERECTOMY  1994   "partial"  . CAPSULAR RELEASE Right 06/06/2020   Procedure: RIGHT SHOULDER MANIPULATION UNDER ANESTHESIA, ROTATOR CUFF REPAIR;  Surgeon: Meredith Pel, MD;  Location: [place];  Service: Orthopedics;  Laterality: Right;  .  CARDIAC CATHETERIZATION N/A 08/05/2015   Procedure: Left Heart Cath and Coronary Angiography;  Surgeon: [name], MD;  Location: [place] [facility];  Service: Cardiovascular;  Laterality: N/A;  . CESAREAN SECTION  1989  . COLONOSCOPY    . CORONARY ANGIOPLASTY  10/03/2013   95% ostial AV G Cx - 2.5 mm AngioSculpt Balloon PTCA; mid LAD 40-60%  . LEFT HEART CATHETERIZATION WITH CORONARY ANGIOGRAM N/A 10/02/2013   Procedure: LEFT HEART CATHETERIZATION WITH CORONARY ANGIOGRAM;  Surgeon: [name], MD;  Location: [facility] CATH LAB;  Service: Cardiovascular;  Laterality: N/A;  . NASAL SEPTOPLASTY W/ TURBINOPLASTY  ~ 2007  . NM MYOVIEW LTD  08/22/2014    Low risk stress nuclear study with a small, severe, fixed defect in the distal inferior wall/apex suggestive of small prior infarct; no ischemia.  LV Wall Motion: NL LV Function; NL Wall Motion  . PERCUTANEOUS CORONARY STENT INTERVENTION (PCI-S) N/A 10/03/2013   cutting balloon angioplasty only no stent.   . TRANSTHORACIC ECHOCARDIOGRAM  10/02/2013   EF 55-60%; mild LVH, no RWMA,   . TUBAL LIGATION  1989    There were no vitals filed for this visit.   Subjective Assessment - 08/05/20 1556    Subjective Pain is 7/10 today and is feeling painful from the weather. She does notice she is able to reach  around her back more than before.    Currently in Pain? Yes    Pain Score 7     Pain Location Shoulder    Pain Orientation Right    Pain Descriptors / Indicators Aching;Tightness    Pain Type Surgical pain    Pain Onset More than a month ago                             [facility] Adult PT Treatment/Exercise - 08/05/20 0001      Shoulder Exercises: Seated   Other Seated Exercises seated table slides 10x flexion, 10x abd, 10x ER      Shoulder Exercises: Pulleys   Flexion 2 minutes    ABduction 2 minutes      Shoulder Exercises: Isometric Strengthening   Flexion 5X5"    External Rotation 5X5"    Internal Rotation 5X5"     ABduction 5X5"      Electrical Stimulation   Electrical Stimulation Location Rt shoulder    Electrical Stimulation Action pre mod    Electrical Stimulation Parameters 12 mins to tolerance    Electrical Stimulation Goals Pain      Vasopneumatic   Number Minutes Vasopneumatic  10 minutes    Vasopnuematic Location  Shoulder    Vasopneumatic Pressure Medium    Vasopneumatic Temperature  34      Manual Therapy   Manual Therapy Passive ROM    Manual therapy comments PROM flex, abd, ER, IR, grade 3 A-P joint mobs 12 mins                    PT Short Term Goals - 07/12/20 1017      PT SHORT TERM GOAL #1   Title Patient will demonstrate independent use of home exercise program to maintain progress from in clinic treatments.    Time 3    Period Weeks    Status On-going    Target Date 07/11/20             PT Long Term Goals - 06/20/20 1107      PT LONG TERM GOAL #1   Title Patient will demonstrate/report pain at worst less than or equal to 2/10 to facilitate minimal limitation in daily activity secondary to pain symptoms.    Time 10    Period Weeks    Status New    Target Date 08/29/20      PT LONG TERM GOAL #2   Title Patient will demonstrate independent use of home exercise program to facilitate ability to maintain/progress functional gains from skilled physical therapy services.    Time 10    Period Weeks    Status New    Target Date 08/29/20      PT LONG TERM GOAL #3   Title Patient will demonstrate return to work/recreational activity at previous level of function without limitations secondary due to condition    Time 10    Period Weeks    Status New    Target Date 08/29/20      PT LONG TERM GOAL #4   Title Pt. will demonstrate Rt shoulder AROM WFL s symptoms to facilitate normal daily use and reaching at PLOF.    Time 10    Period Weeks    Status New    Target Date 08/29/20      PT LONG TERM GOAL #5   Title Pt. will demonstrate Rt UE MMT 5/5  throughout to facilitate ability to perform daily lifting, carrying house activity at PLOF.    Time 10    Period Weeks    Status New    Target Date 08/29/20                 Plan - 08/05/20 1634    Clinical Impression Merkel is dealing with increased pain since last visit. She still is having extreme weakness against gravity. She is able to tolerate AAROM to her endrange but is still extremely limited with flexion/abduction by pain starting just under 90 degrees. Utilized TENS with passive motion for decreased pain and mobility. Continue to progress her tolerance to isometrics and work towards more active motion to pair with stretching for gained motion and function.    Personal Factors and Comorbidities Comorbidity 3+    Comorbidities DM, GERD, HTN, hyperlipidemia    Examination-Activity Limitations Bathing;Bed Mobility;Sleep;Sit;Carry;Toileting;Dressing;Hygiene/Grooming;Lift;Reach Overhead    Examination-Participation Restrictions Occupation;Community Activity;Yard Work;Driving;Cleaning;Meal Prep    Stability/Clinical Decision Making Stable/Uncomplicated    Rehab Potential Good    PT Frequency 2x / week    PT Duration Other (comment)   10 weeks   PT Treatment/Interventions ADLs/Self Care Home Management;Electrical Stimulation;Cryotherapy;Iontophoresis 4mg /ml Dexamethasone;Moist Heat;Balance training;Therapeutic exercise;Therapeutic activities;Functional mobility training;Gait training;DME Instruction;Ultrasound;Neuromuscular re-education;Patient/family education;Passive range of motion;Spinal Manipulations;Joint Manipulations;Dry needling;Taping;Vasopneumatic Device;Manual techniques    PT Next Visit Plan cane exercises, work on isometrics and strength within her range, sidelying ER/flex/abd to minimize gravity    PT Home Exercise Plan [email or web address]    Consulted and Agree with Plan of Care Patient           Patient will benefit from skilled therapeutic intervention in order to  improve the following deficits and impairments:  Decreased activity tolerance,Impaired flexibility,Increased edema,Decreased strength,Decreased mobility,Impaired UE functional use  Visit Diagnosis: Muscle weakness (generalized)  Stiffness of right shoulder joint  Localized edema  Acute pain of right shoulder  Chronic right shoulder pain     Problem List Patient Active Problem List   Diagnosis Date Noted  . Type 2 diabetes mellitus with other circulatory complications, HTN, CVD ([place]) 05/17/2020  . Morbid obesity ([place]) 05/17/2020  . BMI 40.0-44.9, adult ([place]) 05/17/2020  . OSA (obstructive sleep apnea) 06/10/2016  . Costochondritis 06/09/2016  . Bilateral leg cramps 02/07/2016  . Peripheral edema 01/29/2016  . Essential hypertension   . GERD (gastroesophageal reflux disease) 08/10/2014  . Menopausal syndrome 08/10/2014  . CAD S/P percutaneous coronary angioplasty - Ostial AVG Cx - Scoring balloon PTCA 10/03/2013  . Hyperlipidemia with target LDL less than 70 10/02/2013  . H/O non-ST elevation myocardial infarction (NSTEMI) 10/02/2013  . Normocytic anemia, not due to blood loss   . Allergic rhinitis 03/17/2007  . MIGRAINE, COMMON W/O INTRACTABLE MIGRAINE 03/16/2007    Glenetta Hew, SPT 08/05/2020, 4:49 PM  [facility] Physical Therapy [address] [place], [name], [address] Phone: [phone]   Fax:  [phone]  Name: Shia Delaine MRN: [id number] Date of Birth: 1960/07/04

## 2020-08-06 ENCOUNTER — Encounter: Payer: Self-pay | Admitting: Rehabilitative and Restorative Service Providers"

## 2020-08-13 ENCOUNTER — Ambulatory Visit (INDEPENDENT_AMBULATORY_CARE_PROVIDER_SITE_OTHER): Payer: 59 | Admitting: Physical Therapy

## 2020-08-13 ENCOUNTER — Other Ambulatory Visit: Payer: Self-pay

## 2020-08-13 ENCOUNTER — Other Ambulatory Visit: Payer: 59

## 2020-08-13 ENCOUNTER — Encounter: Payer: Self-pay | Admitting: Internal Medicine

## 2020-08-13 DIAGNOSIS — R600 Localized edema: Secondary | ICD-10-CM | POA: Diagnosis not present

## 2020-08-13 DIAGNOSIS — M6281 Muscle weakness (generalized): Secondary | ICD-10-CM | POA: Diagnosis not present

## 2020-08-13 DIAGNOSIS — M25511 Pain in right shoulder: Secondary | ICD-10-CM

## 2020-08-13 DIAGNOSIS — M25611 Stiffness of right shoulder, not elsewhere classified: Secondary | ICD-10-CM | POA: Diagnosis not present

## 2020-08-13 DIAGNOSIS — G8929 Other chronic pain: Secondary | ICD-10-CM

## 2020-08-13 NOTE — Therapy (Signed)
[place], [name], [address] Phone: [phone]   Fax:  [phone]  Physical Therapy Treatment  Patient Details  Name: Christine Barrett MRN: [id number] Date of Birth: [birth year] Referring Provider (PT): Dr. [name]   Encounter Date: 08/13/2020   PT End of Session - 08/13/20 1646    Visit Number 8    Number of Visits 20    Date for PT Re-Evaluation 08/29/20    Progress Note Due on Visit 10    PT Start Time 1600    PT Stop Time 9528    PT Time Calculation (min) 47 min    Activity Tolerance Patient limited by pain;Patient tolerated treatment well    Behavior During Therapy [facility] for tasks assessed/performed           Past Medical History:  Diagnosis Date  . Allergy    SEASONAL  . Anemia   . CAD S/P percutaneous coronary angioplasty 09/2013   a) Ostial AV G Cx - 2.5 mm Angiosculpt; mid LAD 40-60%; b) Myoview 07/2014: LOW RISK, small-severe fixed inferior defect c/w infarct w/o peri-infarct ischemia.  . Chronic bronchitis ([place])    "frequently; not q yr" (10/03/2013)  . Diabetes mellitus without complication ([place])    Type 2-diet controlled.   . Diverticulosis   . Essential hypertension    with prior Accelerated HTN  . GERD (gastroesophageal reflux disease)   . Hiatal hernia   . Hx of non-ST elevation myocardial infarction (NSTEMI) 10/01/2013   Due to Accelerated HTN with existing CAD  . Hyperlipemia   . Migraine    "@ least once/month" (10/03/2013)  . OSA (obstructive sleep apnea) 06/10/2016  . Schatzki's ring   . Seasonal allergies   . Sinusitis   . Spinal headache    during C-section of second child.     Past Surgical History:  Procedure Laterality Date  . ABDOMINAL HYSTERECTOMY  1994   "partial"  . CAPSULAR RELEASE Right 06/06/2020   Procedure: RIGHT SHOULDER MANIPULATION UNDER ANESTHESIA, ROTATOR CUFF REPAIR;  Surgeon: Meredith Pel, MD;  Location: [place];  Service: Orthopedics;  Laterality: Right;  .  CARDIAC CATHETERIZATION N/A 08/05/2015   Procedure: Left Heart Cath and Coronary Angiography;  Surgeon: [name], MD;  Location: [place] CV LAB;  Service: Cardiovascular;  Laterality: N/A;  . CESAREAN SECTION  1989  . COLONOSCOPY    . CORONARY ANGIOPLASTY  10/03/2013   95% ostial AV G Cx - 2.5 mm AngioSculpt Balloon PTCA; mid LAD 40-60%  . LEFT HEART CATHETERIZATION WITH CORONARY ANGIOGRAM N/A 10/02/2013   Procedure: LEFT HEART CATHETERIZATION WITH CORONARY ANGIOGRAM;  Surgeon: [name], MD;  Location: [facility] CATH LAB;  Service: Cardiovascular;  Laterality: N/A;  . NASAL SEPTOPLASTY W/ TURBINOPLASTY  ~ 2007  . NM MYOVIEW LTD  08/22/2014    Low risk stress nuclear study with a small, severe, fixed defect in the distal inferior wall/apex suggestive of small prior infarct; no ischemia.  LV Wall Motion: NL LV Function; NL Wall Motion  . PERCUTANEOUS CORONARY STENT INTERVENTION (PCI-S) N/A 10/03/2013   cutting balloon angioplasty only no stent.   . TRANSTHORACIC ECHOCARDIOGRAM  10/02/2013   EF 55-60%; mild LVH, no RWMA,   . TUBAL LIGATION  1989    There were no vitals filed for this visit.   Subjective Assessment - 08/13/20 1641    Subjective Patient relays pain is 4-5/10 today. She brought her pulleys that she got online to be adjusted, she is  going to continue to work on her motion at home    Currently in Pain? Yes    Pain Score 4     Pain Location Shoulder    Pain Orientation Right    Pain Descriptors / Indicators Aching    Pain Onset More than a month ago                             [facility] Adult PT Treatment/Exercise - 08/13/20 0001      Shoulder Exercises: Sidelying   Flexion 10 reps;AROM      Shoulder Exercises: Standing   Other Standing Exercises standing flexion rollout on stability ball 5x 5 sec hold, abduction olout on stability ball 5x 5 sec, ER on stability ball 5x 5 sec    Other Standing Exercises wall ladder 10x for flexion 5x for abductionabd, ext       Shoulder Exercises: Pulleys   Flexion 2 minutes    ABduction 2 minutes      Shoulder Exercises: Isometric Strengthening   Flexion 5X5"    Extension 5X5"    External Rotation 5X5"    Internal Rotation 5X5"    ABduction 5X5"      Vasopneumatic   Number Minutes Vasopneumatic  10 minutes    Vasopnuematic Location  Shoulder    Vasopneumatic Pressure Medium    Vasopneumatic Temperature  34      Manual Therapy   Manual Therapy Passive ROM    Manual therapy comments PROM flex, abd, ER,  grade 3 A-P and inf mobs 10 mins                    PT Short Term Goals - 07/12/20 1017      PT SHORT TERM GOAL #1   Title Patient will demonstrate independent use of home exercise program to maintain progress from in clinic treatments.    Time 3    Period Weeks    Status On-going    Target Date 07/11/20             PT Long Term Goals - 06/20/20 1107      PT LONG TERM GOAL #1   Title Patient will demonstrate/report pain at worst less than or equal to 2/10 to facilitate minimal limitation in daily activity secondary to pain symptoms.    Time 10    Period Weeks    Status New    Target Date 08/29/20      PT LONG TERM GOAL #2   Title Patient will demonstrate independent use of home exercise program to facilitate ability to maintain/progress functional gains from skilled physical therapy services.    Time 10    Period Weeks    Status New    Target Date 08/29/20      PT LONG TERM GOAL #3   Title Patient will demonstrate return to work/recreational activity at previous level of function without limitations secondary due to condition    Time 10    Period Weeks    Status New    Target Date 08/29/20      PT LONG TERM GOAL #4   Title Pt. will demonstrate Rt shoulder AROM WFL s symptoms to facilitate normal daily use and reaching at PLOF.    Time 10    Period Weeks    Status New    Target Date 08/29/20      PT LONG TERM GOAL #5   Title  Pt. will demonstrate Rt UE MMT 5/5  throughout to facilitate ability to perform daily lifting, carrying house activity at PLOF.    Time 10    Period Weeks    Status New    Target Date 08/29/20                 Plan - 08/13/20 1649    Clinical Impression Statement Patient had decreased pain from last visit and notable improvements with flexion motion with AAROM and AROM. Still highly limited with abduction and ER. Started gravity reduced AROM with flexion and patient tolerated well with only slight increase in pain, continue to progress to work toward full motion and against gravity.    Personal Factors and Comorbidities Comorbidity 3+    Comorbidities DM, GERD, HTN, hyperlipidemia    Examination-Activity Limitations Bathing;Bed Mobility;Sleep;Sit;Carry;Toileting;Dressing;Hygiene/Grooming;Lift;Reach Overhead    Examination-Participation Restrictions Occupation;Community Activity;Yard Work;Driving;Cleaning;Meal Prep    Stability/Clinical Decision Making Stable/Uncomplicated    Rehab Potential Good    PT Frequency 2x / week    PT Duration Other (comment)   10 weeks   PT Treatment/Interventions ADLs/Self Care Home Management;Electrical Stimulation;Cryotherapy;Iontophoresis 4mg /ml Dexamethasone;Moist Heat;Balance training;Therapeutic exercise;Therapeutic activities;Functional mobility training;Gait training;DME Instruction;Ultrasound;Neuromuscular re-education;Patient/family education;Passive range of motion;Spinal Manipulations;Joint Manipulations;Dry needling;Taping;Vasopneumatic Device;Manual techniques    PT Next Visit Plan cane exercises, strength within range work on isometrics and strength within her range, sidelying ER/flex/abd to minimize gravity    PT Home Exercise Plan [email or web address]    Consulted and Agree with Plan of Care Patient           Patient will benefit from skilled therapeutic intervention in order to improve the following deficits and impairments:  Decreased activity tolerance,Impaired flexibility,Increased  edema,Decreased strength,Decreased mobility,Impaired UE functional use  Visit Diagnosis: Chronic right shoulder pain  Muscle weakness (generalized)  Stiffness of right shoulder joint  Localized edema  Acute pain of right shoulder     Problem List Patient Active Problem List   Diagnosis Date Noted  . Type 2 diabetes mellitus with other circulatory complications, HTN, CVD ([place]) 05/17/2020  . Morbid obesity ([place]) 05/17/2020  . BMI 40.0-44.9, adult ([place]) 05/17/2020  . OSA (obstructive sleep apnea) 06/10/2016  . Costochondritis 06/09/2016  . Bilateral leg cramps 02/07/2016  . Peripheral edema 01/29/2016  . Essential hypertension   . GERD (gastroesophageal reflux disease) 08/10/2014  . Menopausal syndrome 08/10/2014  . CAD S/P percutaneous coronary angioplasty - Ostial AVG Cx - Scoring balloon PTCA 10/03/2013  . Hyperlipidemia with target LDL less than 70 10/02/2013  . H/O non-ST elevation myocardial infarction (NSTEMI) 10/02/2013  . Normocytic anemia, not due to blood loss   . Allergic rhinitis 03/17/2007  . MIGRAINE, COMMON W/O INTRACTABLE MIGRAINE 03/16/2007    Glenetta Hew, SPT 08/13/2020, 4:51 PM  During this treatment session, this physical therapist was present, participating in and directing the treatment.   This note has been reviewed and this clinician agrees with the information provided.  Elsie Ra, PT, DPT 08/14/20 8:15 AM  [facility] [address] [place], [name], [address] Phone: [phone]   Fax:  [phone]  Name: Christine Barrett MRN: [id number] Date of Birth: 23-Jan-1961

## 2020-08-14 ENCOUNTER — Encounter: Payer: Self-pay | Admitting: Physical Therapy

## 2020-08-15 ENCOUNTER — Other Ambulatory Visit: Payer: Self-pay

## 2020-08-15 ENCOUNTER — Ambulatory Visit (INDEPENDENT_AMBULATORY_CARE_PROVIDER_SITE_OTHER): Payer: 59 | Admitting: Rehabilitative and Restorative Service Providers"

## 2020-08-15 DIAGNOSIS — M25511 Pain in right shoulder: Secondary | ICD-10-CM

## 2020-08-15 DIAGNOSIS — M25611 Stiffness of right shoulder, not elsewhere classified: Secondary | ICD-10-CM | POA: Diagnosis not present

## 2020-08-15 DIAGNOSIS — M6281 Muscle weakness (generalized): Secondary | ICD-10-CM

## 2020-08-15 DIAGNOSIS — R600 Localized edema: Secondary | ICD-10-CM

## 2020-08-15 DIAGNOSIS — G8929 Other chronic pain: Secondary | ICD-10-CM

## 2020-08-15 NOTE — Therapy (Unsigned)
[place], [name], [address] Phone: [phone]   Fax:  [phone]  Physical Therapy Treatment  Patient Details  Name: Christine Barrett MRN: [id number] Date of Birth: 1961/06/25 Referring Provider (PT): Dr. [name]   Encounter Date: 08/15/2020   PT End of Session - 08/15/20 1553    Visit Number 9    Number of Visits 20    Date for PT Re-Evaluation 08/29/20    Progress Note Due on Visit 10    PT Start Time 0346    PT Stop Time 0430    PT Time Calculation (min) 44 min    Activity Tolerance Patient limited by pain;Patient tolerated treatment well    Behavior During Therapy [facility] for tasks assessed/performed           Past Medical History:  Diagnosis Date  . Allergy    SEASONAL  . Anemia   . CAD S/P percutaneous coronary angioplasty 09/2013   a) Ostial AV G Cx - 2.5 mm Angiosculpt; mid LAD 40-60%; b) Myoview 07/2014: LOW RISK, small-severe fixed inferior defect c/w infarct w/o peri-infarct ischemia.  . Chronic bronchitis ([place])    "frequently; not q yr" (10/03/2013)  . Diabetes mellitus without complication ([place])    Type 2-diet controlled.   . Diverticulosis   . Essential hypertension    with prior Accelerated HTN  . GERD (gastroesophageal reflux disease)   . Hiatal hernia   . Hx of non-ST elevation myocardial infarction (NSTEMI) 10/01/2013   Due to Accelerated HTN with existing CAD  . Hyperlipemia   . Migraine    "@ least once/month" (10/03/2013)  . OSA (obstructive sleep apnea) 06/10/2016  . Schatzki's ring   . Seasonal allergies   . Sinusitis   . Spinal headache    during C-section of second child.     Past Surgical History:  Procedure Laterality Date  . ABDOMINAL HYSTERECTOMY  1994   "partial"  . CAPSULAR RELEASE Right 06/06/2020   Procedure: RIGHT SHOULDER MANIPULATION UNDER ANESTHESIA, ROTATOR CUFF REPAIR;  Surgeon: Meredith Pel, MD;  Location: [place];  Service: Orthopedics;  Laterality: Right;  .  CARDIAC CATHETERIZATION N/A 08/05/2015   Procedure: Left Heart Cath and Coronary Angiography;  Surgeon: [name], MD;  Location: [place] CV LAB;  Service: Cardiovascular;  Laterality: N/A;  . CESAREAN SECTION  1989  . COLONOSCOPY    . CORONARY ANGIOPLASTY  10/03/2013   95% ostial AV G Cx - 2.5 mm AngioSculpt Balloon PTCA; mid LAD 40-60%  . LEFT HEART CATHETERIZATION WITH CORONARY ANGIOGRAM N/A 10/02/2013   Procedure: LEFT HEART CATHETERIZATION WITH CORONARY ANGIOGRAM;  Surgeon: [name], MD;  Location: [facility] CATH LAB;  Service: Cardiovascular;  Laterality: N/A;  . NASAL SEPTOPLASTY W/ TURBINOPLASTY  ~ 2007  . NM MYOVIEW LTD  08/22/2014    Low risk stress nuclear study with a small, severe, fixed defect in the distal inferior wall/apex suggestive of small prior infarct; no ischemia.  LV Wall Motion: NL LV Function; NL Wall Motion  . PERCUTANEOUS CORONARY STENT INTERVENTION (PCI-S) N/A 10/03/2013   cutting balloon angioplasty only no stent.   . TRANSTHORACIC ECHOCARDIOGRAM  10/02/2013   EF 55-60%; mild LVH, no RWMA,   . TUBAL LIGATION  1989    There were no vitals filed for this visit.   Subjective Assessment - 08/15/20 1556    Subjective Patient is not having any pain at rest today, just with certain movements she gets some pain.  Currently in Pain? No/denies    Pain Onset More than a month ago                                       PT Short Term Goals - 07/12/20 1017      PT SHORT TERM GOAL #1   Title Patient will demonstrate independent use of home exercise program to maintain progress from in clinic treatments.    Time 3    Period Weeks    Status On-going    Target Date 07/11/20             PT Long Term Goals - 08/16/20 0807      PT LONG TERM GOAL #1   Title Patient will demonstrate/report pain at worst less than or equal to 2/10 to facilitate minimal limitation in daily activity secondary to pain symptoms.    Time 10    Period Weeks     Status On-going      PT LONG TERM GOAL #2   Title Patient will demonstrate independent use of home exercise program to facilitate ability to maintain/progress functional gains from skilled physical therapy services.    Time 10    Period Weeks    Status On-going      PT LONG TERM GOAL #3   Title Patient will demonstrate return to work/recreational activity at previous level of function without limitations secondary due to condition    Time 10    Period Weeks    Status On-going      PT LONG TERM GOAL #4   Title Pt. will demonstrate Rt shoulder AROM WFL s symptoms to facilitate normal daily use and reaching at PLOF.    Time 10    Period Weeks    Status On-going      PT LONG TERM GOAL #5   Title Pt. will demonstrate Rt UE MMT 5/5 throughout to facilitate ability to perform daily lifting, carrying house activity at PLOF.    Time 10    Period Weeks    Status On-going                 Plan - 08/15/20 1554    Clinical Impression Statement Session focused on introducing gravity reduced AROM, patient tolerated well.  She is able to flex actively in supine to ~100 degrees but is very fatigued after 10 reps. Continue graded gentle strength and increased motion for improved function through introducing stabilization techniques. Will reassess patient progress next visit.    Personal Factors and Comorbidities Comorbidity 3+    Comorbidities DM, GERD, HTN, hyperlipidemia    Examination-Activity Limitations Bathing;Bed Mobility;Sleep;Sit;Carry;Toileting;Dressing;Hygiene/Grooming;Lift;Reach Overhead    Examination-Participation Restrictions Occupation;Community Activity;Yard Work;Driving;Cleaning;Meal Prep    Stability/Clinical Decision Making Stable/Uncomplicated    Rehab Potential Good    PT Frequency 2x / week    PT Duration Other (comment)   10 weeks   PT Treatment/Interventions ADLs/Self Care Home Management;Electrical Stimulation;Cryotherapy;Iontophoresis 4mg /ml Dexamethasone;Moist  Heat;Balance training;Therapeutic exercise;Therapeutic activities;Functional mobility training;Gait training;DME Instruction;Ultrasound;Neuromuscular re-education;Patient/family education;Passive range of motion;Spinal Manipulations;Joint Manipulations;Dry needling;Taping;Vasopneumatic Device;Manual techniques    PT Next Visit Plan progress note this visit, continue AROM with minimized gravity and progress to against gravity, tabilization with ball,    PT Home Exercise Plan [email or web address]    Consulted and Agree with Plan of Care Patient           Patient will benefit from skilled therapeutic intervention  in order to improve the following deficits and impairments:  Decreased activity tolerance,Impaired flexibility,Increased edema,Decreased strength,Decreased mobility,Impaired UE functional use  Visit Diagnosis: Chronic right shoulder pain  Muscle weakness (generalized)  Stiffness of right shoulder joint  Localized edema  Acute pain of right shoulder     Problem List Patient Active Problem List   Diagnosis Date Noted  . Type 2 diabetes mellitus with other circulatory complications, HTN, CVD ([place]) 05/17/2020  . Morbid obesity ([place]) 05/17/2020  . BMI 40.0-44.9, adult ([place]) 05/17/2020  . OSA (obstructive sleep apnea) 06/10/2016  . Costochondritis 06/09/2016  . Bilateral leg cramps 02/07/2016  . Peripheral edema 01/29/2016  . Essential hypertension   . GERD (gastroesophageal reflux disease) 08/10/2014  . Menopausal syndrome 08/10/2014  . CAD S/P percutaneous coronary angioplasty - Ostial AVG Cx - Scoring balloon PTCA 10/03/2013  . Hyperlipidemia with target LDL less than 70 10/02/2013  . H/O non-ST elevation myocardial infarction (NSTEMI) 10/02/2013  . Normocytic anemia, not due to blood loss   . Allergic rhinitis 03/17/2007  . MIGRAINE, COMMON W/O INTRACTABLE MIGRAINE 03/16/2007    Glenetta Hew, SPT 4:50pm 08/15/2020  Addend to provide LTGs assessment.  Scot Jun, PT,  DPT, OCS, ATC 08/16/20  8:10 AM    [facility] [address] [place], [name], [address] Phone: [phone]   Fax:  [phone]  Name: Kelse Ploch MRN: [id number] Date of Birth: March 30, 1961

## 2020-08-16 ENCOUNTER — Encounter: Payer: Self-pay | Admitting: Rehabilitative and Restorative Service Providers"

## 2020-08-19 ENCOUNTER — Encounter: Payer: 59 | Admitting: Rehabilitative and Restorative Service Providers"

## 2020-08-20 ENCOUNTER — Encounter: Payer: 59 | Attending: Family Medicine | Admitting: Dietician

## 2020-08-20 ENCOUNTER — Ambulatory Visit: Payer: 59 | Admitting: Family Medicine

## 2020-08-20 DIAGNOSIS — E1159 Type 2 diabetes mellitus with other circulatory complications: Secondary | ICD-10-CM | POA: Insufficient documentation

## 2020-08-21 ENCOUNTER — Other Ambulatory Visit: Payer: Self-pay

## 2020-08-21 ENCOUNTER — Other Ambulatory Visit: Payer: Self-pay | Admitting: Family Medicine

## 2020-08-21 ENCOUNTER — Ambulatory Visit (INDEPENDENT_AMBULATORY_CARE_PROVIDER_SITE_OTHER): Payer: 59 | Admitting: Physical Therapy

## 2020-08-21 DIAGNOSIS — R1310 Dysphagia, unspecified: Secondary | ICD-10-CM

## 2020-08-21 DIAGNOSIS — M25611 Stiffness of right shoulder, not elsewhere classified: Secondary | ICD-10-CM | POA: Diagnosis not present

## 2020-08-21 DIAGNOSIS — G8929 Other chronic pain: Secondary | ICD-10-CM

## 2020-08-21 DIAGNOSIS — M25511 Pain in right shoulder: Secondary | ICD-10-CM

## 2020-08-21 DIAGNOSIS — R600 Localized edema: Secondary | ICD-10-CM

## 2020-08-21 DIAGNOSIS — M6281 Muscle weakness (generalized): Secondary | ICD-10-CM | POA: Diagnosis not present

## 2020-08-21 NOTE — Therapy (Signed)
[facility] Physical Therapy [address] [place], [name], [address] Phone: [phone]   Fax:  [phone]  Physical Therapy Treatment/Progress note Progress Note reporting period 06/20/2020 to 08/21/2020  See below for objective and subjective measurements relating to patients progress with PT.  Patient Details  Name: Christine Barrett MRN: [id number] Date of Birth: 11/21/1960 Referring Provider (PT): Dr. [name]   Encounter Date: 08/21/2020   PT End of Session - 08/21/20 1641    Visit Number 10    Number of Visits 20    Date for PT Re-Evaluation 08/29/20    Progress Note Due on Visit 20    PT Start Time 0404    PT Stop Time 0451    PT Time Calculation (min) 47 min    Activity Tolerance Patient limited by pain;Patient tolerated treatment well    Behavior During Therapy [facility] for tasks assessed/performed           Past Medical History:  Diagnosis Date   Allergy    SEASONAL   Anemia    CAD S/P percutaneous coronary angioplasty 09/2013   a) Ostial AV G Cx - 2.5 mm Angiosculpt; mid LAD 40-60%; b) Myoview 07/2014: LOW RISK, small-severe fixed inferior defect c/w infarct w/o peri-infarct ischemia.   Chronic bronchitis ([place])    "frequently; not q yr" (10/03/2013)   Diabetes mellitus without complication (HCC)    Type 2-diet controlled.    Diverticulosis    Essential hypertension    with prior Accelerated HTN   GERD (gastroesophageal reflux disease)    Hiatal hernia    Hx of non-ST elevation myocardial infarction (NSTEMI) 10/01/2013   Due to Accelerated HTN with existing CAD   Hyperlipemia    Migraine    "@ least once/month" (10/03/2013)   OSA (obstructive sleep apnea) 06/10/2016   Schatzki's ring    Seasonal allergies    Sinusitis    Spinal headache    during C-section of second child.     Past Surgical History:  Procedure Laterality Date   ABDOMINAL HYSTERECTOMY  1994   "partial"   CAPSULAR RELEASE Right 06/06/2020   Procedure: RIGHT  SHOULDER MANIPULATION UNDER ANESTHESIA, ROTATOR CUFF REPAIR;  Surgeon: Meredith Pel, MD;  Location: [place];  Service: Orthopedics;  Laterality: Right;   CARDIAC CATHETERIZATION N/A 08/05/2015   Procedure: Left Heart Cath and Coronary Angiography;  Surgeon: [name], MD;  Location: [place] CV LAB;  Service: Cardiovascular;  Laterality: N/A;   CESAREAN SECTION  1989   COLONOSCOPY     CORONARY ANGIOPLASTY  10/03/2013   95% ostial AV G Cx - 2.5 mm AngioSculpt Balloon PTCA; mid LAD 40-60%   LEFT HEART CATHETERIZATION WITH CORONARY ANGIOGRAM N/A 10/02/2013   Procedure: LEFT HEART CATHETERIZATION WITH CORONARY ANGIOGRAM;  Surgeon: [name], MD;  Location: [facility] CATH LAB;  Service: Cardiovascular;  Laterality: N/A;   NASAL SEPTOPLASTY W/ TURBINOPLASTY  ~ 2007   NM MYOVIEW LTD  08/22/2014    Low risk stress nuclear study with a small, severe, fixed defect in the distal inferior wall/apex suggestive of small prior infarct; no ischemia.  LV Wall Motion: NL LV Function; NL Wall Motion   PERCUTANEOUS CORONARY STENT INTERVENTION (PCI-S) N/A 10/03/2013   cutting balloon angioplasty only no stent.    TRANSTHORACIC ECHOCARDIOGRAM  10/02/2013   EF 55-60%; mild LVH, no RWMA,    TUBAL LIGATION  1989    There were no vitals filed for this visit.   Subjective Assessment - 08/21/20 1626  Subjective Patient is having some soreness from introducing new exercises last visit, pain is 5/10 today.    Patient Stated Goals reduce pain increase motion    Currently in Pain? Yes    Pain Score 5     Pain Location Shoulder    Pain Orientation Right    Pain Descriptors / Indicators Aching    Pain Type Surgical pain    Pain Onset More than a month ago              [facility] PT Assessment - 08/21/20 0001      Observation/Other Assessments   Focus on Therapeutic Outcomes (FOTO)  50%      AROM   Right/Left Shoulder Right    Right Shoulder Flexion 102 Degrees   supine   Right Shoulder ABduction  87 Degrees    Right Shoulder Internal Rotation 64 Degrees    Right Shoulder External Rotation 57 Degrees      Strength   Right/Left Shoulder Right    Right Shoulder Flexion 3-/5    Right Shoulder ABduction 3-/5    Right Shoulder Internal Rotation 3-/5    Right Shoulder External Rotation 3-/5    Right/Left Elbow Right                         [facility] Adult PT Treatment/Exercise - 08/21/20 0001      Shoulder Exercises: Supine   Flexion AROM;10 reps      Shoulder Exercises: Standing   Other Standing Exercises ER with cane 10x 5 sec hold      Shoulder Exercises: Pulleys   Flexion 2 minutes    ABduction 2 minutes    Other Pulley Exercises 2 mins standing Internal rotation behind back      Shoulder Exercises: Therapy Ball   Flexion 10 reps;Right   yellow ball on table   Right/Left 20 reps   yellow ball on table   Other Therapy Ball Exercises 10 CW/10 CCW yellow ball on table      Vasopneumatic   Number Minutes Vasopneumatic  10 minutes    Vasopnuematic Location  Shoulder    Vasopneumatic Pressure Medium    Vasopneumatic Temperature  34      Manual Therapy   Manual Therapy Passive ROM;Joint mobilization    Manual therapy comments PROM flex, abd, ER,  grade 3 A-P and inf mobs 8 mins                    PT Short Term Goals - 07/12/20 1017      PT SHORT TERM GOAL #1   Title Patient will demonstrate independent use of home exercise program to maintain progress from in clinic treatments.    Time 3    Period Weeks    Status On-going    Target Date 07/11/20             PT Long Term Goals - 08/16/20 0807      PT LONG TERM GOAL #1   Title Patient will demonstrate/report pain at worst less than or equal to 2/10 to facilitate minimal limitation in daily activity secondary to pain symptoms.    Time 10    Period Weeks    Status On-going      PT LONG TERM GOAL #2   Title Patient will demonstrate independent use of home exercise program to facilitate  ability to maintain/progress functional gains from skilled physical therapy services.    Time 10  Period Weeks    Status On-going      PT LONG TERM GOAL #3   Title Patient will demonstrate return to work/recreational activity at previous level of function without limitations secondary due to condition    Time 10    Period Weeks    Status On-going      PT LONG TERM GOAL #4   Title Pt. will demonstrate Rt shoulder AROM WFL s symptoms to facilitate normal daily use and reaching at PLOF.    Time 10    Period Weeks    Status On-going      PT LONG TERM GOAL #5   Title Pt. will demonstrate Rt UE MMT 5/5 throughout to facilitate ability to perform daily lifting, carrying house activity at PLOF.    Time 10    Period Weeks    Status On-going                 Plan - 08/21/20 1645    Clinical Impression Statement Updated patient progress today, patient has made significant improvements with her AROM. She is starting to begin AROM against resistance or until fatigue and is tolerating well. Patient can benefit from continued skilled PT to continue to maintain and progress her motion and develop strength within her range for ease of daily activities.    Personal Factors and Comorbidities Comorbidity 3+    Comorbidities DM, GERD, HTN, hyperlipidemia    Examination-Activity Limitations Bathing;Bed Mobility;Sleep;Sit;Carry;Toileting;Dressing;Hygiene/Grooming;Lift;Reach Overhead    Examination-Participation Restrictions Occupation;Community Activity;Yard Work;Driving;Cleaning;Meal Prep    Stability/Clinical Decision Making Stable/Uncomplicated    Rehab Potential Good    PT Frequency 2x / week    PT Duration Other (comment)   10 weeks   PT Treatment/Interventions ADLs/Self Care Home Management;Electrical Stimulation;Cryotherapy;Iontophoresis 4mg /ml Dexamethasone;Moist Heat;Balance training;Therapeutic exercise;Therapeutic activities;Functional mobility training;Gait training;DME  Instruction;Ultrasound;Neuromuscular re-education;Patient/family education;Passive range of motion;Spinal Manipulations;Joint Manipulations;Dry needling;Taping;Vasopneumatic Device;Manual techniques    PT Next Visit Plan continue AROM with minimized gravity and progress to against gravity, tabilization with ball,    PT Home Exercise Plan [id number]    Consulted and Agree with Plan of Care Patient           Patient will benefit from skilled therapeutic intervention in order to improve the following deficits and impairments:  Decreased activity tolerance,Impaired flexibility,Increased edema,Decreased strength,Decreased mobility,Impaired UE functional use  Visit Diagnosis: Chronic right shoulder pain  Muscle weakness (generalized)  Stiffness of right shoulder joint  Localized edema  Acute pain of right shoulder     Problem List Patient Active Problem List   Diagnosis Date Noted   Type 2 diabetes mellitus with other circulatory complications, HTN, CVD ([place]) 05/17/2020   Morbid obesity ([place]) 05/17/2020   BMI 40.0-44.9, adult ([place]) 05/17/2020   OSA (obstructive sleep apnea) 06/10/2016   Costochondritis 06/09/2016   Bilateral leg cramps 02/07/2016   Peripheral edema 01/29/2016   Essential hypertension    GERD (gastroesophageal reflux disease) 08/10/2014   Menopausal syndrome 08/10/2014   CAD S/P percutaneous coronary angioplasty - Ostial AVG Cx - Scoring balloon PTCA 10/03/2013   Hyperlipidemia with target LDL less than 70 10/02/2013   H/O non-ST elevation myocardial infarction (NSTEMI) 10/02/2013   Normocytic anemia, not due to blood loss    Allergic rhinitis 03/17/2007   MIGRAINE, COMMON W/O INTRACTABLE MIGRAINE 03/16/2007    Glenetta Hew, SPT 08/21/2020, 4:53 PM   During this treatment session, this physical therapist was present, participating in and directing the treatment.   This note has been reviewed and this clinician agrees  with the information  provided.  Elsie Ra, PT, DPT 08/22/20 8:17 AM    [facility] Physical Therapy [address] [place], [name], [address] Phone: [phone]   Fax:  [phone]  Name: Christine Barrett MRN: [id number] Date of Birth: [birth year]

## 2020-09-02 ENCOUNTER — Encounter: Payer: 59 | Admitting: Rehabilitative and Restorative Service Providers"

## 2020-09-04 ENCOUNTER — Ambulatory Visit (INDEPENDENT_AMBULATORY_CARE_PROVIDER_SITE_OTHER): Payer: 59 | Admitting: Orthopedic Surgery

## 2020-09-04 ENCOUNTER — Other Ambulatory Visit: Payer: Self-pay

## 2020-09-04 ENCOUNTER — Ambulatory Visit (INDEPENDENT_AMBULATORY_CARE_PROVIDER_SITE_OTHER): Payer: 59 | Admitting: Physical Therapy

## 2020-09-04 DIAGNOSIS — G8929 Other chronic pain: Secondary | ICD-10-CM

## 2020-09-04 DIAGNOSIS — M6281 Muscle weakness (generalized): Secondary | ICD-10-CM | POA: Diagnosis not present

## 2020-09-04 DIAGNOSIS — M25511 Pain in right shoulder: Secondary | ICD-10-CM

## 2020-09-04 DIAGNOSIS — M25611 Stiffness of right shoulder, not elsewhere classified: Secondary | ICD-10-CM | POA: Diagnosis not present

## 2020-09-04 DIAGNOSIS — R600 Localized edema: Secondary | ICD-10-CM

## 2020-09-04 DIAGNOSIS — M75101 Unspecified rotator cuff tear or rupture of right shoulder, not specified as traumatic: Secondary | ICD-10-CM

## 2020-09-04 NOTE — Therapy (Addendum)
[facility] Physical Therapy [address] [place], [name], [address] Phone: [phone]   Fax:  [phone]  Physical Therapy Treatment/Discharge   Patient Details  Name: Christine Barrett MRN: [id number] Date of Birth: 23-Sep-1960 Referring Provider (PT): Jerry Caras Date: 09/04/2020   PT End of Session - 09/04/20 1534    Visit Number 11    Number of Visits 20    Date for PT Re-Evaluation 10/16/20    Progress Note Due on Visit 20    PT Start Time 4709    PT Stop Time 1606    PT Time Calculation (min) 51 min    Activity Tolerance Patient limited by pain;Patient tolerated treatment well    Behavior During Therapy [facility] for tasks assessed/performed           Past Medical History:  Diagnosis Date  . Allergy    SEASONAL  . Anemia   . CAD S/P percutaneous coronary angioplasty 09/2013   a) Ostial AV G Cx - 2.5 mm Angiosculpt; mid LAD 40-60%; b) Myoview 07/2014: LOW RISK, small-severe fixed inferior defect c/w infarct w/o peri-infarct ischemia.  . Chronic bronchitis ([place])    "frequently; not q yr" (10/03/2013)  . Diabetes mellitus without complication ([place])    Type 2-diet controlled.   . Diverticulosis   . Essential hypertension    with prior Accelerated HTN  . GERD (gastroesophageal reflux disease)   . Hiatal hernia   . Hx of non-ST elevation myocardial infarction (NSTEMI) 10/01/2013   Due to Accelerated HTN with existing CAD  . Hyperlipemia   . Migraine    "@ least once/month" (10/03/2013)  . OSA (obstructive sleep apnea) 06/10/2016  . Schatzki's ring   . Seasonal allergies   . Sinusitis   . Spinal headache    during C-section of second child.     Past Surgical History:  Procedure Laterality Date  . ABDOMINAL HYSTERECTOMY  1994   "partial"  . CAPSULAR RELEASE Right 06/06/2020   Procedure: RIGHT SHOULDER MANIPULATION UNDER ANESTHESIA, ROTATOR CUFF REPAIR;  Surgeon: Meredith Pel, MD;  Location: [place];  Service: Orthopedics;  Laterality: Right;   . CARDIAC CATHETERIZATION N/A 08/05/2015   Procedure: Left Heart Cath and Coronary Angiography;  Surgeon: [name], MD;  Location: [place] CV LAB;  Service: Cardiovascular;  Laterality: N/A;  . CESAREAN SECTION  1989  . COLONOSCOPY    . CORONARY ANGIOPLASTY  10/03/2013   95% ostial AV G Cx - 2.5 mm AngioSculpt Balloon PTCA; mid LAD 40-60%  . LEFT HEART CATHETERIZATION WITH CORONARY ANGIOGRAM N/A 10/02/2013   Procedure: LEFT HEART CATHETERIZATION WITH CORONARY ANGIOGRAM;  Surgeon: [name], MD;  [facility]: [facility] CATH [facility];  Service: Cardiovascular;  Laterality: N/A;  . NASAL SEPTOPLASTY W/ TURBINOPLASTY  ~ 2007  . NM MYOVIEW LTD  08/22/2014    Low risk stress nuclear study with a small, severe, fixed defect in the distal inferior wall/apex suggestive of small prior infarct; no ischemia.  LV Wall Motion: NL LV Function; NL Wall Motion  . PERCUTANEOUS CORONARY STENT INTERVENTION (PCI-S) N/A 10/03/2013   cutting balloon angioplasty only no stent.   . TRANSTHORACIC ECHOCARDIOGRAM  10/02/2013   EF 55-60%; mild LVH, no RWMA,   . TUBAL LIGATION  1989    There were no vitals filed for this visit.   Subjective Assessment - 09/04/20 1528    Subjective Patient is having some soreness and around 5/10 shoulder pain today    Limitations House hold activities;Lifting  Patient Stated Goals reduce pain increase motion    Pain Onset More than a month ago              [facility] PT Assessment - 09/04/20 0001      Assessment   Medical Diagnosis S/P Rt shoulder manip, RTC repair, biceps tenodesis, adhesive capsulitis    Referring Provider (PT) Dean      AROM   Right Shoulder Flexion 125 Degrees    Right Shoulder ABduction 90 Degrees    Right Shoulder Internal Rotation --   L4 behind back   Right Shoulder External Rotation --   C7 behind head     PROM   Right Shoulder Flexion 140 Degrees    Right Shoulder ABduction 110 Degrees    Right Shoulder Internal Rotation 55 Degrees    Right Shoulder  External Rotation 70 Degrees      Strength   Right Shoulder Flexion 3-/5    Right Shoulder ABduction 3-/5    Right Shoulder Internal Rotation 5/5    Right Shoulder External Rotation 4/5             [facility] Adult PT Treatment/Exercise - 09/04/20 0001      Exercises   Exercises Shoulder      Shoulder Exercises: Supine   Other Supine Exercises wand flexion, abd, ER X 10 reps ea      Shoulder Exercises: Standing   Theraband Level (Shoulder Extension) Level 2 (Red)    Row 15 reps    Theraband Level (Shoulder Row) Level 2 (Red)    Other Standing Exercises wall ladder 10x for flexion 5x for abduction holding 5 sec at top      Shoulder Exercises: Pulleys   Flexion 2 minutes    ABduction 2 minutes      Shoulder Exercises: ROM/Strengthening   UBE (Upper Arm Bike) 2 min fwd, 2 min retro L2      Vasopneumatic   Number Minutes Vasopneumatic  10 minutes    Vasopnuematic Location  Shoulder    Vasopneumatic Pressure Medium    Vasopneumatic Temperature  34      Manual Therapy   Manual therapy comments Rt shouldr PROM all planes, GH mobs inferior, A-P, P-A mobs grade 2-3                    PT Short Term Goals - 07/12/20 1017      PT SHORT TERM GOAL #1   Title Patient will demonstrate independent use of home exercise program to maintain progress from in clinic treatments.    Time 3    Period Weeks    Status On-going    Target Date 07/11/20             PT Long Term Goals - 08/16/20 0807      PT LONG TERM GOAL #1   Title Patient will demonstrate/report pain at worst less than or equal to 2/10 to facilitate minimal limitation in daily activity secondary to pain symptoms.    Time 10    Period Weeks    Status On-going      PT LONG TERM GOAL #2   Title Patient will demonstrate independent use of home exercise program to facilitate ability to maintain/progress functional gains from skilled physical therapy services.    Time 10    Period Weeks    Status On-going       PT LONG TERM GOAL #3   Title Patient will demonstrate return to work/recreational activity  at previous level of function without limitations secondary due to condition    Time 10    Period Weeks    Status On-going      PT LONG TERM GOAL #4   Title Pt. will demonstrate Rt shoulder AROM WFL s symptoms to facilitate normal daily use and reaching at PLOF.    Time 10    Period Weeks    Status On-going      PT LONG TERM GOAL #5   Title Pt. will demonstrate Rt UE MMT 5/5 throughout to facilitate ability to perform daily lifting, carrying house activity at PLOF.    Time 10    Period Weeks    Status On-going                 Plan - 09/04/20 1600    Clinical Impression Statement Recert as she is out of current plan of care date. She will follow up with MD today after PT. Measurements were updated which reflect progress in both shoulder ROM and strength however she continues to be limited in these areas and will continue to benefit from skilled PT to increase functional use of her Rt arm.    Personal Factors and Comorbidities Comorbidity 3+    Comorbidities DM, GERD, HTN, hyperlipidemia    Examination-Activity Limitations Bathing;Bed Mobility;Sleep;Sit;Carry;Toileting;Dressing;Hygiene/Grooming;Lift;Reach Overhead    Examination-Participation Restrictions Occupation;Community Activity;Yard Work;Driving;Cleaning;Meal Prep    Stability/Clinical Decision Making Stable/Uncomplicated    Rehab Potential Good    PT Frequency 2x / week    PT Duration Other (comment)   10 weeks   PT Treatment/Interventions ADLs/Self Care Home Management;Electrical Stimulation;Cryotherapy;Iontophoresis 4mg /ml Dexamethasone;Moist Heat;Balance training;Therapeutic exercise;Therapeutic activities;Functional mobility training;Gait training;DME Instruction;Ultrasound;Neuromuscular re-education;Patient/family education;Passive range of motion;Spinal Manipulations;Joint Manipulations;Dry needling;Taping;Vasopneumatic  Device;Manual techniques    PT Next Visit Plan continue to improve ROM and strength as tolerated ,    PT Home Exercise Plan [email or web address]    Consulted and Agree with Plan of Care Patient           Patient will benefit from skilled therapeutic intervention in order to improve the following deficits and impairments:  Decreased activity tolerance,Impaired flexibility,Increased edema,Decreased strength,Decreased mobility,Impaired UE functional use  Visit Diagnosis: Chronic right shoulder pain  Muscle weakness (generalized)  Stiffness of right shoulder joint  Localized edema  Acute pain of right shoulder     Problem List Patient Active Problem List   Diagnosis Date Noted  . Type 2 diabetes mellitus with other circulatory complications, HTN, CVD ([place]) 05/17/2020  . Morbid obesity ([place]) 05/17/2020  . BMI 40.0-44.9, adult ([place]) 05/17/2020  . OSA (obstructive sleep apnea) 06/10/2016  . Costochondritis 06/09/2016  . Bilateral leg cramps 02/07/2016  . Peripheral edema 01/29/2016  . Essential hypertension   . GERD (gastroesophageal reflux disease) 08/10/2014  . Menopausal syndrome 08/10/2014  . CAD S/P percutaneous coronary angioplasty - Ostial AVG Cx - Scoring balloon PTCA 10/03/2013  . Hyperlipidemia with target LDL less than 70 10/02/2013  . H/O non-ST elevation myocardial infarction (NSTEMI) 10/02/2013  . Normocytic anemia, not due to blood loss   . Allergic rhinitis 03/17/2007  . MIGRAINE, COMMON W/O INTRACTABLE MIGRAINE 03/16/2007    Silvestre Mesi 09/04/2020, 4:05 PM   PHYSICAL THERAPY DISCHARGE SUMMARY  Visits from Start of Care: 11  Current functional level related to goals / functional outcomes:see note   Remaining deficits: See note   Education / Equipment: HEP Plan: Patient agrees to discharge.  Patient goals were partially met. Patient is being discharged due to not returning  since the last visit.  ?????     Scot Jun, PT, DPT, OCS, ATC 10/24/20   1:16 PM     [facility] Physical Therapy [address] [place], [name], [address] Phone: [phone]   Fax:  [phone]  Name: Nakeesha Bowler MRN: [id number] Date of Birth: April 27, 1961

## 2020-09-07 ENCOUNTER — Encounter: Payer: Self-pay | Admitting: Orthopedic Surgery

## 2020-09-07 NOTE — Progress Notes (Signed)
Post-Op Visit Note   Patient: Christine Barrett           Date of Birth: 21-Nov-1960           MRN: [id number] Visit Date: 09/04/2020 PCP: Jinny Sanders, MD   Assessment & Plan:  Chief Complaint:  Chief Complaint  Patient presents with  . Right Shoulder - Routine Post Op   Visit Diagnoses:  1. Tear of right supraspinatus tendon     Plan: Patient presents now about 3 months out from right shoulder manipulation and rotator cuff repair.  Hard for her to get her arm back behind her back.  Abduction gives her a little bit more pain than flexion.  She is back at work.  Has occasional pain but not every day.  On exam she has improving rotator cuff strength and not quite full passive range of motion yet.  Plan is to continue physical therapy with 8-week return for final recheck.  Overall she is improved and is improving.  Follow-Up Instructions: Return in about 8 weeks (around 10/30/2020).   Orders:  No orders of the defined types were placed in this encounter.  No orders of the defined types were placed in this encounter.   Imaging: No results found.  PMFS History: Patient Active Problem List   Diagnosis Date Noted  . Type 2 diabetes mellitus with other circulatory complications, HTN, CVD ([place]) 05/17/2020  . Morbid obesity ([place]) 05/17/2020  . BMI 40.0-44.9, adult ([place]) 05/17/2020  . OSA (obstructive sleep apnea) 06/10/2016  . Costochondritis 06/09/2016  . Bilateral leg cramps 02/07/2016  . Peripheral edema 01/29/2016  . Essential hypertension   . GERD (gastroesophageal reflux disease) 08/10/2014  . Menopausal syndrome 08/10/2014  . CAD S/P percutaneous coronary angioplasty - Ostial AVG Cx - Scoring balloon PTCA 10/03/2013  . Hyperlipidemia with target LDL less than 70 10/02/2013  . H/O non-ST elevation myocardial infarction (NSTEMI) 10/02/2013  . Normocytic anemia, not due to blood loss   . Allergic rhinitis 03/17/2007  . MIGRAINE, COMMON W/O INTRACTABLE MIGRAINE 03/16/2007    Past Medical History:  Diagnosis Date  . Allergy    SEASONAL  . Anemia   . CAD S/P percutaneous coronary angioplasty 09/2013   a) Ostial AV G Cx - 2.5 mm Angiosculpt; mid LAD 40-60%; b) Myoview 07/2014: LOW RISK, small-severe fixed inferior defect c/w infarct w/o peri-infarct ischemia.  . Chronic bronchitis ([place])    "frequently; not q yr" (10/03/2013)  . Diabetes mellitus without complication ([place])    Type 2-diet controlled.   . Diverticulosis   . Essential hypertension    with prior Accelerated HTN  . GERD (gastroesophageal reflux disease)   . Hiatal hernia   . Hx of non-ST elevation myocardial infarction (NSTEMI) 10/01/2013   Due to Accelerated HTN with existing CAD  . Hyperlipemia   . Migraine    "@ least once/month" (10/03/2013)  . OSA (obstructive sleep apnea) 06/10/2016  . Schatzki's ring   . Seasonal allergies   . Sinusitis   . Spinal headache    during C-section of second child.     Family History  Adopted: Yes  Problem Relation Age of Onset  . Diabetes Mother   . Breast cancer Sister   . Hypertension Sister   . Colon cancer Neg Hx   . Heart attack Neg Hx   . Stroke Neg Hx     Past Surgical History:  Procedure Laterality Date  . ABDOMINAL HYSTERECTOMY  1994   "partial"  .  CAPSULAR RELEASE Right 06/06/2020   Procedure: RIGHT SHOULDER MANIPULATION UNDER ANESTHESIA, ROTATOR CUFF REPAIR;  Surgeon: Meredith Pel, MD;  Location: [facility];  Service: Orthopedics;  Laterality: Right;  . CARDIAC CATHETERIZATION N/A 08/05/2015   Procedure: Left Heart Cath and Coronary Angiography;  Surgeon: [name], MD;  Location: [facility];  Service: Cardiovascular;  Laterality: N/A;  . CESAREAN SECTION  1989  . COLONOSCOPY    . CORONARY ANGIOPLASTY  10/03/2013   95% ostial AV G Cx - 2.5 mm AngioSculpt Balloon PTCA; mid LAD 40-60%  . LEFT HEART CATHETERIZATION WITH CORONARY ANGIOGRAM N/A 10/02/2013   Procedure: LEFT HEART CATHETERIZATION WITH CORONARY ANGIOGRAM;  Surgeon:  [name], MD;  Location: [facility] CATH LAB;  Service: Cardiovascular;  Laterality: N/A;  . NASAL SEPTOPLASTY W/ TURBINOPLASTY  ~ 2007  . NM MYOVIEW LTD  08/22/2014    Low risk stress nuclear study with a small, severe, fixed defect in the distal inferior wall/apex suggestive of small prior infarct; no ischemia.  LV Wall Motion: NL LV Function; NL Wall Motion  . PERCUTANEOUS CORONARY STENT INTERVENTION (PCI-S) N/A 10/03/2013   cutting balloon angioplasty only no stent.   . TRANSTHORACIC ECHOCARDIOGRAM  10/02/2013   EF 55-60%; mild LVH, no RWMA,   . TUBAL LIGATION  1989   Social History   Occupational History  . Occupation: Research scientist (life sciences)  Tobacco Use  . Smoking status: Never Smoker  . Smokeless tobacco: Never Used  Vaping Use  . Vaping Use: Never used  Substance and Sexual Activity  . Alcohol use: Yes    Alcohol/week: 0.0 standard drinks    Comment: occ  . Drug use: No  . Sexual activity: Yes    Birth control/protection: Surgical

## 2020-09-09 ENCOUNTER — Encounter: Payer: 59 | Admitting: Physical Therapy

## 2020-09-10 LAB — HM MAMMOGRAPHY

## 2020-09-11 ENCOUNTER — Telehealth: Payer: Self-pay | Admitting: Physical Therapy

## 2020-09-11 ENCOUNTER — Encounter: Payer: 59 | Admitting: Physical Therapy

## 2020-09-11 NOTE — Telephone Encounter (Signed)
Pt no show for PT appointment today. They were contacted and informed of this via voicemail. They were provided the date and time of their next appointment on voicemail. They were instructed to call us to let us know if they cannot make their appointment.  Elsie Ra, PT, DPT 09/11/20 4:22 PM

## 2020-09-16 ENCOUNTER — Encounter: Payer: 59 | Admitting: Rehabilitative and Restorative Service Providers"

## 2020-09-16 ENCOUNTER — Encounter: Payer: Self-pay | Admitting: Family Medicine

## 2020-09-16 ENCOUNTER — Telehealth: Payer: Self-pay

## 2020-09-16 NOTE — Telephone Encounter (Signed)
Patient called in to triage line and wanted to speak with Dr. [name] or Butch Penny, CMA regarding her mammogram results. Please advise.

## 2020-09-16 NOTE — Telephone Encounter (Signed)
Spoke with Christine Barrett.  She states she got a letter in the mail about her mammogram and it stated that she needed to speak with her PCP due to her breast being heterogeneously dense and her family history that more test may need to be considered.  Mammogram results placed back in Dr. Rometta Emery office in box to review.  The report is normal and recommendations in to repeat mammogram in one year.  Please advise.

## 2020-09-19 ENCOUNTER — Encounter: Payer: 59 | Admitting: Rehabilitative and Restorative Service Providers"

## 2020-09-20 ENCOUNTER — Encounter: Payer: Self-pay | Admitting: Family Medicine

## 2020-09-20 DIAGNOSIS — Z803 Family history of malignant neoplasm of breast: Secondary | ICD-10-CM | POA: Insufficient documentation

## 2020-09-20 NOTE — Telephone Encounter (Signed)
Called and discussed mammogram results with patient. Patient HAS  heterogeneously dense breast tissue. Sister with breast cancer Calculated lifestime risk of brast cancer 28%  No change in 09/10/2020 mammogram.. will look into MRI breast for next year screening test given higher risk... discuss in fall at [facility]

## 2020-09-23 ENCOUNTER — Encounter: Payer: 59 | Admitting: Physical Therapy

## 2020-09-25 ENCOUNTER — Encounter: Payer: 59 | Admitting: Physical Therapy

## 2020-10-02 ENCOUNTER — Telehealth (INDEPENDENT_AMBULATORY_CARE_PROVIDER_SITE_OTHER): Payer: 59 | Admitting: Family Medicine

## 2020-10-02 ENCOUNTER — Other Ambulatory Visit: Payer: Self-pay

## 2020-10-02 DIAGNOSIS — I10 Essential (primary) hypertension: Secondary | ICD-10-CM | POA: Diagnosis not present

## 2020-10-02 DIAGNOSIS — J0110 Acute frontal sinusitis, unspecified: Secondary | ICD-10-CM | POA: Diagnosis not present

## 2020-10-02 DIAGNOSIS — R0981 Nasal congestion: Secondary | ICD-10-CM | POA: Diagnosis not present

## 2020-10-02 DIAGNOSIS — J301 Allergic rhinitis due to pollen: Secondary | ICD-10-CM | POA: Diagnosis not present

## 2020-10-02 MED ORDER — AZITHROMYCIN 250 MG PO TABS: ORAL_TABLET | ORAL | 0 refills | Status: DC

## 2020-10-02 NOTE — Progress Notes (Signed)
I connected with Christine Barrett on 10/02/20 at 11:20 AM EDT by video and verified that I am speaking with the correct person using two identifiers.   I discussed the limitations, risks, security and privacy concerns of performing an evaluation and management service by video and the availability of in person appointments. I also discussed with the patient that there may be a patient responsible charge related to this service. The patient expressed understanding and agreed to proceed.  Patient location: Work Provider Location: [place] Participants: Lesleigh Noe and Christine Barrett   Subjective:     Christine Barrett is a 60 y.o. female presenting for Sinusitis (X 2 days ), Sore Throat, and Cough     Sinusitis This is a new problem. Episode onset: 09/30/2020. There has been no fever. Associated symptoms include congestion, coughing (dry), sinus pressure and a sore throat. Pertinent negatives include no headaches, shortness of breath or sneezing. Treatments tried: emergency C, ginger tea. The treatment provided mild relief.  Sore Throat  Associated symptoms include congestion and coughing (dry). Pertinent negatives include no headaches or shortness of breath.  Cough Associated symptoms include a sore throat. Pertinent negatives include no headaches or shortness of breath.   Wearing a mask outside to help with pollen   Out of allergy medication Has not tired Flonase   #HTN - her home cuff is not working - is taking her medication - will check at [facility] today  Review of Systems  HENT: Positive for congestion, sinus pressure and sore throat. Negative for sneezing.   Respiratory: Positive for cough (dry). Negative for shortness of breath.   Neurological: Negative for headaches.     Social History   Tobacco Use  Smoking Status Never Smoker  Smokeless Tobacco Never Used        Objective:   BP Readings from Last 3 Encounters:  06/06/20 (!) 164/82  06/04/20 (!) 163/91   05/17/20 140/84   Wt Readings from Last 3 Encounters:  06/25/20 282 lb (127.9 kg)  06/19/20 287 lb (130.2 kg)  06/06/20 289 lb 3.2 oz (131.2 kg)    There were no vitals taken for this visit.   Physical Exam Constitutional:      Appearance: Normal appearance. She is not ill-appearing.  HENT:     Head: Normocephalic and atraumatic.     Right Ear: External ear normal.     Left Ear: External ear normal.  Eyes:     Conjunctiva/sclera: Conjunctivae normal.  Pulmonary:     Effort: Pulmonary effort is normal. No respiratory distress.  Neurological:     Mental Status: She is alert. Mental status is at baseline.  Psychiatric:        Mood and Affect: Mood normal.        Behavior: Behavior normal.        Thought Content: Thought content normal.        Judgment: Judgment normal.            Assessment & Plan:   Problem List Items Addressed This Visit      Cardiovascular and Mediastinum   Essential hypertension (Chronic)    BP elevated in December but no recent office checks. Cont losartan 100 mg, imdur 60 mg, hctz 25 mg, carvedilol 25 mg. Advised monitoring at home and call if elevated at home for closer f/u with pcp.         Respiratory   Allergic rhinitis    Discussed sinus symptoms likely allergy driving  given she has not been taking her allergy medication. Restart singulair at night and xyzal during the day. Start saline rinse and flonase. Azithromycin prescribed if no improvement in next 4 days as going out of town. She will take a rapid covid test to rule this out though lower suspicion at this time.        Other Visit Diagnoses    Acute non-recurrent frontal sinusitis    -  Primary   Relevant Medications   azithromycin (ZITHROMAX) 250 MG tablet   Congestion of paranasal sinus           Return if symptoms worsen or fail to improve.  Lesleigh Noe, MD

## 2020-10-02 NOTE — Assessment & Plan Note (Signed)
Discussed sinus symptoms likely allergy driving given she has not been taking her allergy medication. Restart singulair at night and xyzal during the day. Start saline rinse and flonase. Azithromycin prescribed if no improvement in next 4 days as going out of town. She will take a rapid covid test to rule this out though lower suspicion at this time.

## 2020-10-02 NOTE — Patient Instructions (Addendum)
I think your sinus symptoms are likely due to allergies  1) Take Xyzal and Singulair daily 2) Neti pot (saline rinse) twice a day - especially at night 3) Start Flonase once per day  Take a covid test to rule this out. If positive call back immediately  If no improvement or worsening (fevers, more pressure) - can try azithromycin   Blood pressure - please check your blood pressure at the store - they may be able to check this at rehab for you too - if elevated, make a follow-up visit to discuss with Dr. [name].

## 2020-10-02 NOTE — Assessment & Plan Note (Signed)
BP elevated in December but no recent office checks. Cont losartan 100 mg, imdur 60 mg, hctz 25 mg, carvedilol 25 mg. Advised monitoring at home and call if elevated at home for closer f/u with pcp.

## 2020-10-10 ENCOUNTER — Telehealth: Payer: Self-pay

## 2020-10-10 DIAGNOSIS — R062 Wheezing: Secondary | ICD-10-CM

## 2020-10-10 MED ORDER — ALBUTEROL SULFATE HFA 108 (90 BASE) MCG/ACT IN AERS: INHALATION_SPRAY | RESPIRATORY_TRACT | 0 refills | Status: DC

## 2020-10-10 NOTE — Telephone Encounter (Signed)
Albuterol sent to pharmacy  If worsening or not improving would recommend f/u visit next week.

## 2020-10-10 NOTE — Telephone Encounter (Signed)
Pt had video visit on 10/02/20 with Dr Einar Pheasant; pt finished zpak on 10/09/20; pt said she feels better; cough and congestion is better but has sinus pressure and nausea which pt thinks could be related to sinus drainage at back of throat and occas prod cough with yellow phlegm; no fever or S/T; slight SOB on and off. Offered pt video appt and pt cannot due to work. UC & ED precautions given and pt voiced understanding but did request note sent to Dr Einar Pheasant about possibly getting refill on inhaler used 2 - 3 yrs ago. albuterol inhaler on med list. Pt request cb when reviewed by Dr Einar Pheasant. [employer] [place].

## 2020-10-16 NOTE — Telephone Encounter (Signed)
Ms. Christine Barrett notified as instructed by telephone.  She would like to schedule appointment with Dr. [name] to discuss allergy issues and possible referral to an allergist.  Appointment scheduled for 10/18/2020 at 3:00 pm.

## 2020-10-18 ENCOUNTER — Other Ambulatory Visit: Payer: Self-pay

## 2020-10-18 ENCOUNTER — Ambulatory Visit: Payer: BC Managed Care – PPO | Admitting: Family Medicine

## 2020-10-18 ENCOUNTER — Encounter: Payer: Self-pay | Admitting: Family Medicine

## 2020-10-18 VITALS — BP 120/82 | HR 75 | Temp 97.4°F | Ht 69.0 in | Wt 285.0 lb

## 2020-10-18 DIAGNOSIS — J301 Allergic rhinitis due to pollen: Secondary | ICD-10-CM

## 2020-10-18 DIAGNOSIS — J309 Allergic rhinitis, unspecified: Secondary | ICD-10-CM | POA: Diagnosis not present

## 2020-10-18 MED ORDER — PREDNISONE 20 MG PO TABS: ORAL_TABLET | ORAL | 0 refills | Status: DC

## 2020-10-18 NOTE — Patient Instructions (Signed)
Complete  prednisone taper.. continue other allergy meds.  We will call to set up allergist referral.

## 2020-10-18 NOTE — Assessment & Plan Note (Signed)
No clear current bacterial infection.  Treat sinus pressure with prednisone.  Continue Singulair, nasal steroid, Xyzal and nasal saline irrigation.  Will refe to allergist for further treatment options.

## 2020-10-18 NOTE — Progress Notes (Signed)
Patient ID: Christine Barrett, female    DOB: 1960-12-28, 60 y.o.   MRN: [id number]  This visit was conducted in person.  BP 120/82   Pulse 75   Temp (!) 97.4 F (36.3 C) (Temporal)   Ht 5\' 9"  (1.753 m)   Wt 285 lb (129.3 kg)   SpO2 94%   BMI 42.09 kg/m    CC:  Chief Complaint  Patient presents with  . Allergies    Discuss possible allergist referral.  Seen by Dr. Einar Pheasant 10/02/2020    Subjective:   HPI: Christine Barrett is a 60 y.o. female presenting on 10/18/2020 for Allergies (Discuss possible allergist referral.  Seen by Dr. Einar Pheasant 10/02/2020)  Seen By Dr. Einar Pheasant on 10/02/2020 Felt sinus symptoms, itchy eyes likely due to allergies.  Continued Singulair and Xyzal, nasal saline and Flonase. Did no get better so complete azithromycin... nasal congestion cleared, still with clear mucus and productive cough.. still with sinus pressure No  Current SOB , no wheeze. Occ nasuea  Hx of allergy injections... interested in restarting.     Hypertension:   Good control on current regimen.  Wt Readings from Last 3 Encounters:  10/18/20 285 lb (129.3 kg)  06/25/20 282 lb (127.9 kg)  06/19/20 287 lb (130.2 kg)   BP Readings from Last 3 Encounters:  10/18/20 120/82  06/06/20 (!) 164/82  06/04/20 (!) 163/91  Using medication without problems or lightheadedness: none Chest pain with exertion:none Edema:none Short of breath:none Average home BPs: Other issues:   Relevant past medical, surgical, family and social history reviewed and updated as indicated. Interim medical history since our last visit reviewed. Allergies and medications reviewed and updated. Outpatient Medications Prior to Visit  Medication Sig Dispense Refill  . albuterol (VENTOLIN HFA) 108 (90 Base) MCG/ACT inhaler INHALE 2 PUFFS BY MOUTH EVERY 6 HOURS AS NEEDED FOR WHEEZING OR  SHORTNESS  OF  BREATH 18 each 0  . aspirin EC 81 MG EC tablet Take 1 tablet (81 mg total) by mouth daily.    [name] BLACK ELDERBERRY PO Take 2 each by  mouth daily.    . carvedilol (COREG) 25 MG tablet TAKE 1 TABLET BY MOUTH TWICE DAILY WITH A MEAL 180 tablet 3  . clotrimazole (LOTRIMIN) 1 % cream Apply 1 application topically 2 (two) times daily. 30 g 0  . ezetimibe (ZETIA) 10 MG tablet Take 1 tablet (10 mg total) by mouth daily. 90 tablet 3  . famotidine (PEPCID) 20 MG tablet Take 20 mg by mouth daily.     . fluticasone (FLONASE) 50 MCG/ACT nasal spray Place 2 sprays into both nostrils daily as needed for allergies or rhinitis.    . hydrochlorothiazide (HYDRODIURIL) 25 MG tablet Take 1 tablet (25 mg total) by mouth daily. 90 tablet 3  . isosorbide mononitrate (IMDUR) 60 MG 24 hr tablet Take 2 tablets (120 mg total) by mouth daily. 180 tablet 3  . latanoprost (XALATAN) 0.005 % ophthalmic solution Place 1 drop into both eyes at bedtime.     [name] levocetirizine (XYZAL) 5 MG tablet Take 5 mg by mouth daily.    [name] losartan (COZAAR) 100 MG tablet Take 1 tablet by mouth once daily 90 tablet 3  . methocarbamol (ROBAXIN) 500 MG tablet Take 1 tablet (500 mg total) by mouth every 8 (eight) hours as needed. 30 tablet 0  . Misc Natural Products (APPLE CIDER VINEGAR DIET PO) Take 2 each by mouth daily.    . montelukast (SINGULAIR) 10 MG  tablet TAKE 1 TABLET BY MOUTH AT BEDTIME 90 tablet 3  . Multiple Vitamin (MULTIVITAMIN WITH MINERALS) TABS tablet Take 1 tablet by mouth daily.    . Multiple Vitamins-Minerals (EMERGEN-C IMMUNE) PACK Take 1 packet by mouth daily.    . ondansetron (ZOFRAN) 4 MG tablet Take 1 tablet (4 mg total) by mouth every 8 (eight) hours as needed for nausea or vomiting. 30 tablet 0  . oxyCODONE-acetaminophen (PERCOCET) 5-325 MG tablet Take 1 tablet by mouth every 12 (twelve) hours as needed for severe pain. 25 tablet 0  . pantoprazole (PROTONIX) 40 MG tablet TAKE 1  BY MOUTH ONCE DAILY 90 tablet 0  . azithromycin (ZITHROMAX) 250 MG tablet Take 2 tablets (500 mg) today. Then take 1 tablet daily for the next 4 days. 6 tablet 0   No  facility-administered medications prior to visit.     Per HPI unless specifically indicated in ROS section below Review of Systems  Constitutional: Negative for fatigue and fever.  HENT: Positive for postnasal drip, rhinorrhea and sinus pressure. Negative for ear pain.   Eyes: Negative for pain.  Respiratory: Negative for chest tightness and shortness of breath.   Cardiovascular: Negative for chest pain, palpitations and leg swelling.  Gastrointestinal: Negative for abdominal pain.  Genitourinary: Negative for dysuria.   Objective:  BP 120/82   Pulse 75   Temp (!) 97.4 F (36.3 C) (Temporal)   Ht 5\' 9"  (1.753 m)   Wt 285 lb (129.3 kg)   SpO2 94%   BMI 42.09 kg/m   Wt Readings from Last 3 Encounters:  10/18/20 285 lb (129.3 kg)  06/25/20 282 lb (127.9 kg)  06/19/20 287 lb (130.2 kg)      Physical Exam Constitutional:      General: She is not in acute distress.    Appearance: Normal appearance. She is well-developed. She is not ill-appearing or toxic-appearing.  HENT:     Head: Normocephalic.     Right Ear: Hearing, tympanic membrane, ear canal and external ear normal. Tympanic membrane is not erythematous, retracted or bulging.     Left Ear: Hearing, tympanic membrane, ear canal and external ear normal. Tympanic membrane is not erythematous, retracted or bulging.     Nose: No mucosal edema or rhinorrhea.     Right Turbinates: Enlarged, swollen and pale.     Left Turbinates: Enlarged, swollen and pale.     Right Sinus: Frontal sinus tenderness present. No maxillary sinus tenderness.     Left Sinus: Frontal sinus tenderness present. No maxillary sinus tenderness.     Mouth/Throat:     Pharynx: Uvula midline.  Eyes:     General: Lids are normal. Lids are everted, no foreign bodies appreciated.     Conjunctiva/sclera: Conjunctivae normal.     Pupils: Pupils are equal, round, and reactive to light.  Neck:     Thyroid: No thyroid mass or thyromegaly.     Vascular: No  carotid bruit.     Trachea: Trachea normal.  Cardiovascular:     Rate and Rhythm: Normal rate and regular rhythm.     Pulses: Normal pulses.     Heart sounds: Normal heart sounds, S1 normal and S2 normal. No murmur heard. No friction rub. No gallop.   Pulmonary:     Effort: Pulmonary effort is normal. No tachypnea or respiratory distress.     Breath sounds: Normal breath sounds. No decreased breath sounds, wheezing, rhonchi or rales.  Abdominal:     General: Bowel sounds  are normal.     Palpations: Abdomen is soft.     Tenderness: There is no abdominal tenderness.  Musculoskeletal:     Cervical back: Normal range of motion and neck supple.  Skin:    General: Skin is warm and dry.     Findings: No rash.  Neurological:     Mental Status: She is alert.  Psychiatric:        Mood and Affect: Mood is not anxious or depressed.        Speech: Speech normal.        Behavior: Behavior normal. Behavior is cooperative.        Thought Content: Thought content normal.        Judgment: Judgment normal.       Results for orders placed or performed in visit on 09/16/20  [facility] MAMMOGRAPHY  Result Value Ref Range   [facility] Mammogram 0-4 Bi-Rad 0-4 Bi-Rad, Self Reported Normal    This visit occurred during the SARS-CoV-2 public health emergency.  Safety protocols were in place, including screening questions prior to the visit, additional usage of staff PPE, and extensive cleaning of exam room while observing appropriate contact time as indicated for disinfecting solutions.   COVID 19 screen:  No recent travel or known exposure to COVID19 The patient denies respiratory symptoms of COVID 19 at this time. The importance of social distancing was discussed today.   Assessment and Plan    Problem List Items Addressed This Visit    Allergic sinusitis - Primary    No clear current bacterial infection.  Treat sinus pressure with prednisone.  Continue Singulair, nasal steroid, Xyzal and nasal saline  irrigation.  Will refe to allergist for further treatment options.      Relevant Medications   predniSONE (DELTASONE) 20 MG tablet       Eliezer Lofts, MD

## 2020-10-21 ENCOUNTER — Encounter: Payer: Self-pay | Admitting: *Deleted

## 2020-11-06 ENCOUNTER — Other Ambulatory Visit: Payer: 59

## 2020-11-15 ENCOUNTER — Ambulatory Visit (INDEPENDENT_AMBULATORY_CARE_PROVIDER_SITE_OTHER): Payer: BC Managed Care – PPO | Admitting: Family Medicine

## 2020-11-15 DIAGNOSIS — J301 Allergic rhinitis due to pollen: Secondary | ICD-10-CM

## 2020-12-01 IMAGING — MR MR LUMBAR SPINE W/O CM
4 of 5 series · 18 of 48 positions shown · non-contrast
Comparison: None.

CLINICAL DATA: Spinal stenosis, lumbosacral pain

EXAM:
MRI LUMBAR SPINE WITHOUT CONTRAST
TECHNIQUE: Multiplanar, multisequence MR imaging of the lumbar spine was
performed. No intravenous contrast was administered.

[Series 6: T2 · sagittal · 4.0mm · 0.73mm/px · 5 of 15 slices shown (1 of 2)]
[im 1/15]
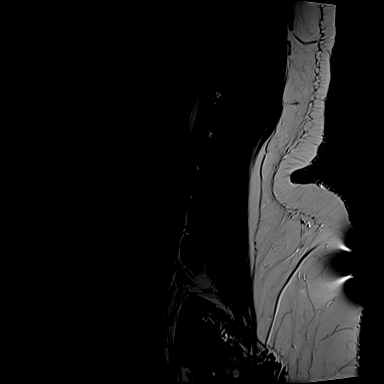
[im 4/15]
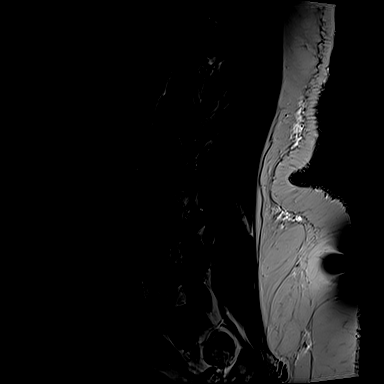
[im 8/15]
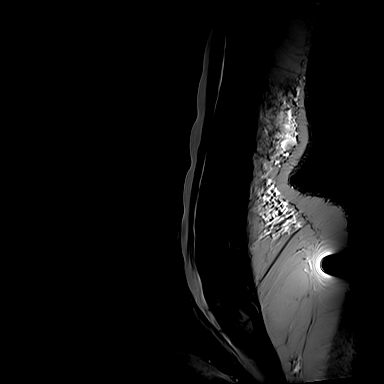
[im 11/15]
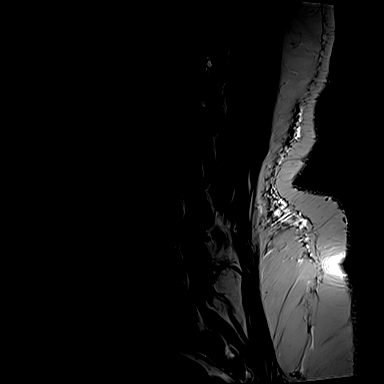
[im 15/15]
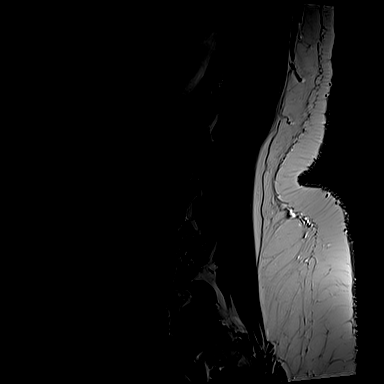

[Series 7: T1 · sagittal · 4.0mm · 0.73mm/px · 3 of 15 slices shown (1 of 2)]
[im 1/15]
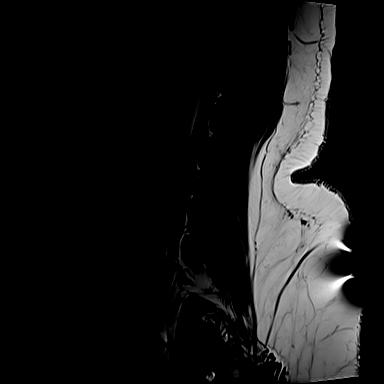
[im 8/15]
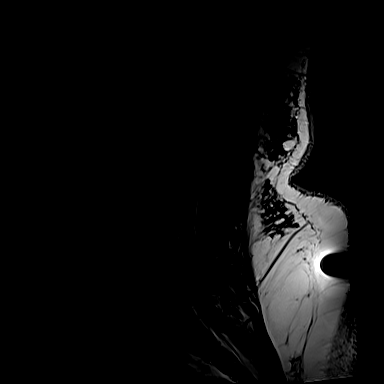
[im 15/15]
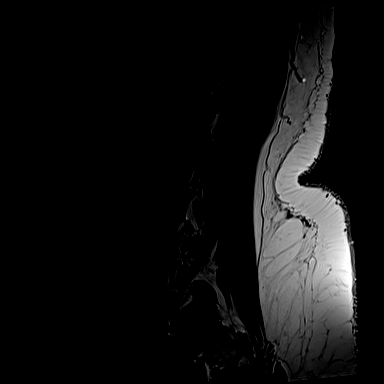

[Series 11: T1 · axial · 4.0mm · 0.28mm/px · z∈[-83,+89]mm · 3 of 42 slices shown (2 of 2)]
[im 6/42]
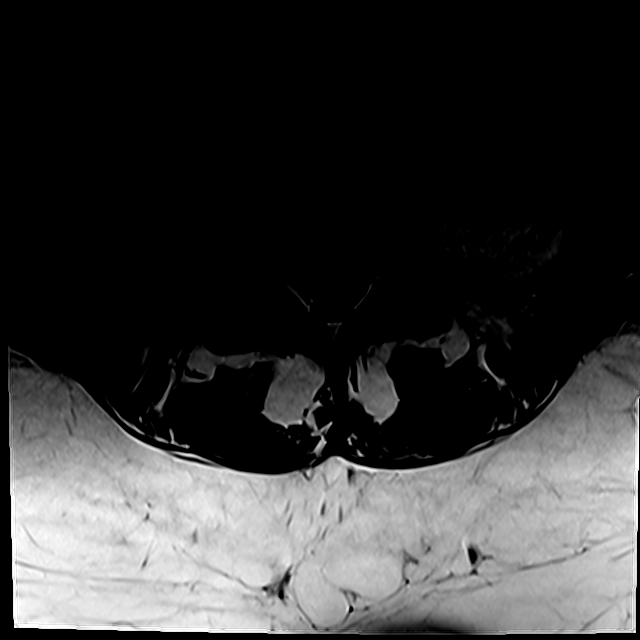
[im 22/42]
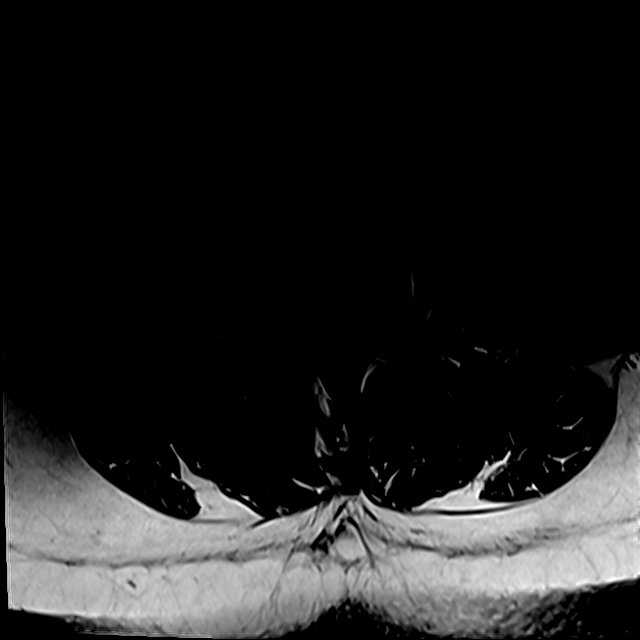
[im 36/42]
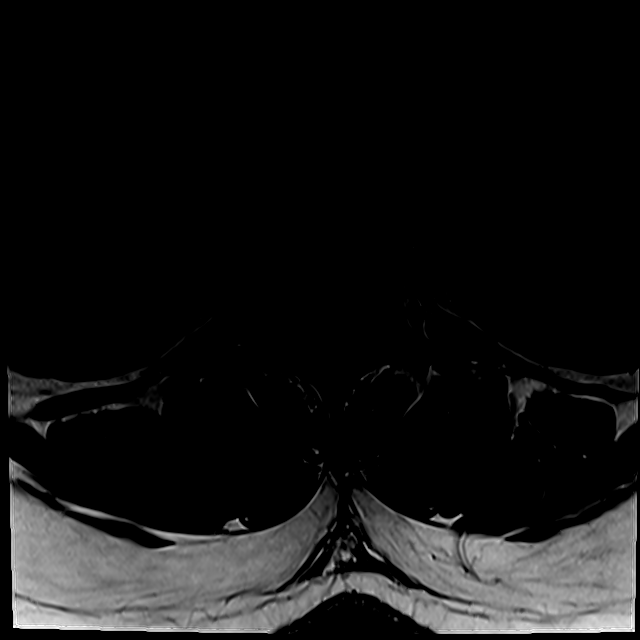

[Series 14: T2 · axial · 4.0mm · 0.28mm/px · z∈[-97,+89]mm · 7 of 42 slices shown (2 of 2)]
[im 3/42]
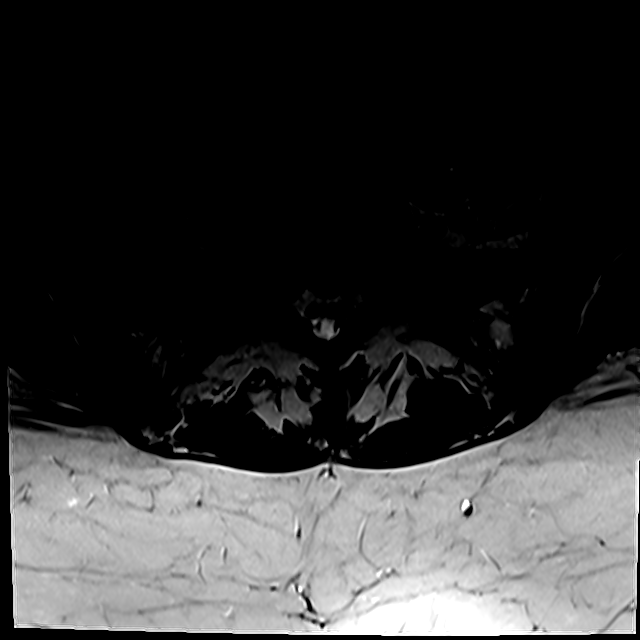
[im 6/42]
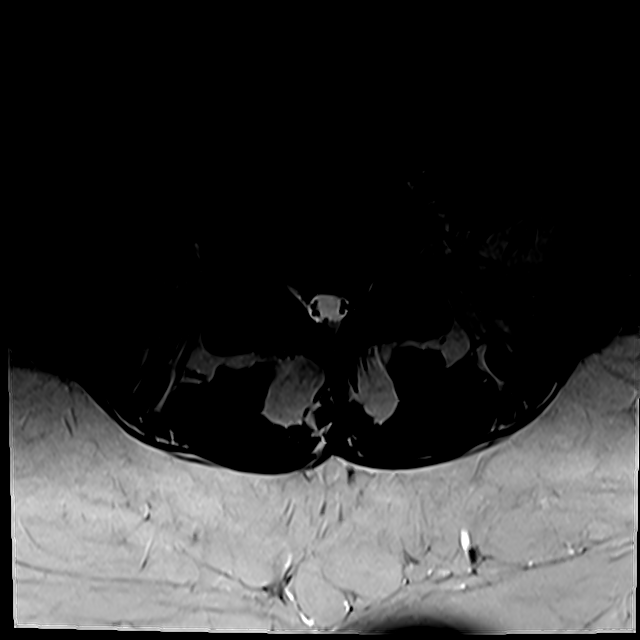
[im 9/42]
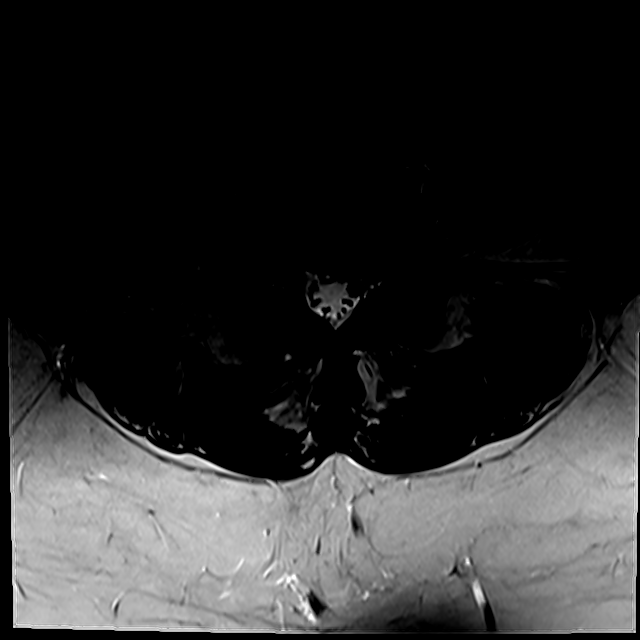
[im 14/42]
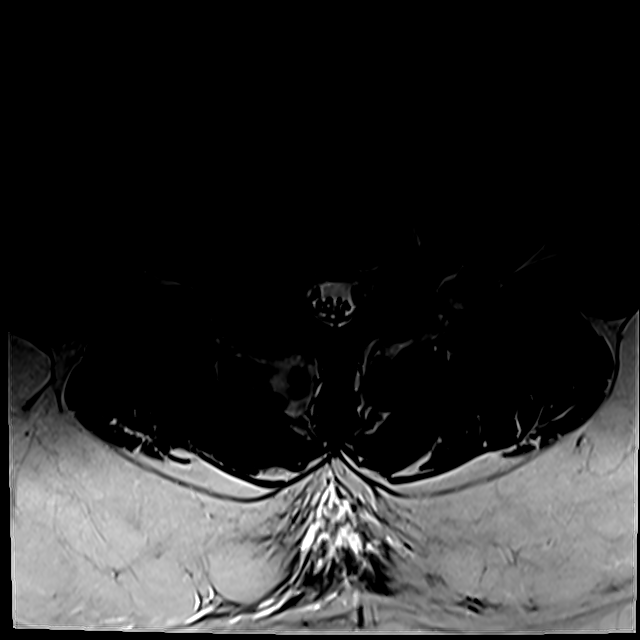
[im 20/42]
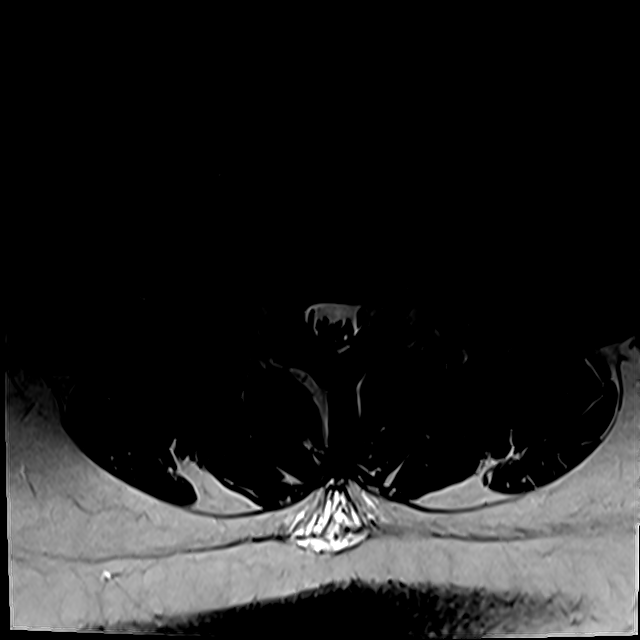
[im 22/42]
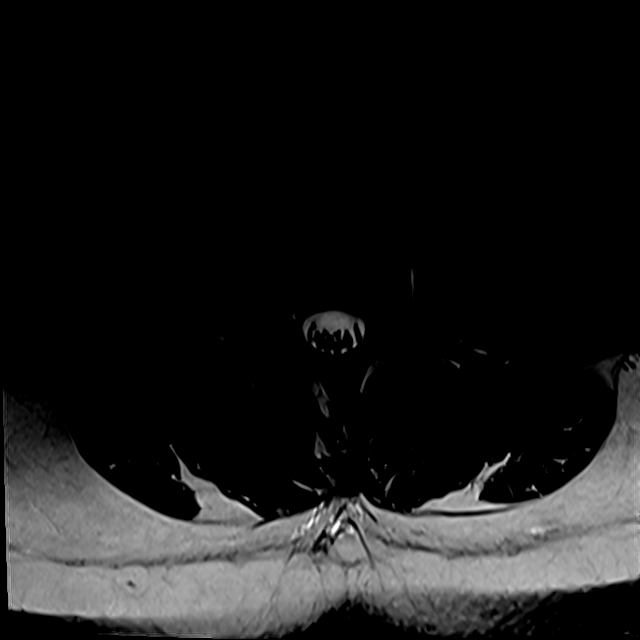
[im 36/42]
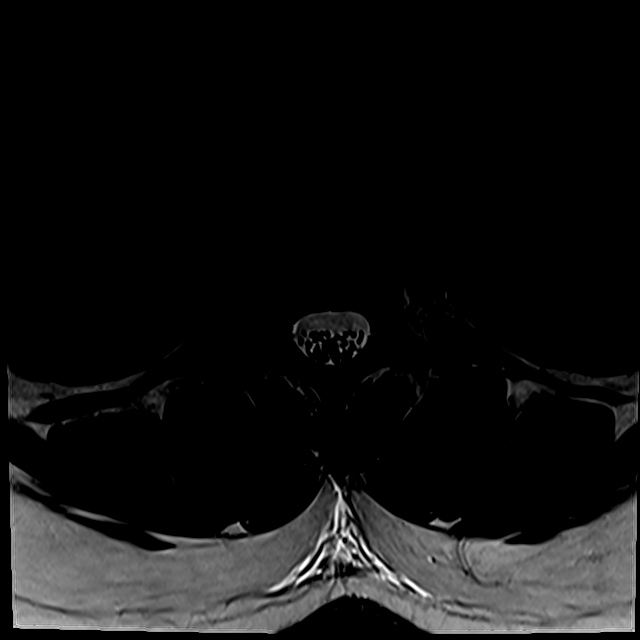

[18 of 48 positions shown; findings below may reference images not displayed]

FINDINGS: Segmentation:  Standard.

Alignment:  1-2 mm anterolisthesis of L4 on L5.

Vertebrae:  No fracture, evidence of discitis, or bone lesion.

Conus medullaris and cauda equina: Conus extends to the L1 level.
Conus and cauda equina appear normal.

Paraspinal and other soft tissues: No acute paraspinal abnormality.

Disc levels:

Disc spaces: Degenerative disease with disc height loss at L2-3.
Disc desiccation at L3-4, L4-5 and L5-S1.

T12-L1: No significant disc bulge. No evidence of neural foraminal
stenosis. No central canal stenosis.

L1-L2: Minimal broad-based disc bulge. Mild bilateral facet
arthropathy. No evidence of neural foraminal stenosis. No central
canal stenosis.

L2-L3: No significant disc bulge. Mild broad-based disc bulge. Mild
bilateral facet arthropathy. No central canal stenosis.

L3-L4: Broad-based disc bulge. Mild bilateral facet arthropathy. No
evidence of neural foraminal stenosis. No central canal stenosis.

L4-L5: Broad-based disc bulge with a left foraminal annular fissure.
Moderate bilateral facet arthropathy. Bilateral lateral recess
narrowing. No evidence of neural foraminal stenosis. No central
canal stenosis.

L5-S1: Broad-based disc bulge with a central annular fissure. Mild
bilateral facet arthropathy. No evidence of neural foraminal
stenosis. No central canal stenosis.
IMPRESSION: 1. Mild lumbar spine spondylosis as described above.
2. No acute osseous injury of the lumbar spine.

## 2020-12-26 ENCOUNTER — Other Ambulatory Visit: Payer: Self-pay | Admitting: Family Medicine

## 2020-12-26 DIAGNOSIS — R1310 Dysphagia, unspecified: Secondary | ICD-10-CM

## 2020-12-28 NOTE — Progress Notes (Signed)
Cancelled.  

## 2020-12-31 NOTE — Progress Notes (Signed)
New Patient Note  RE: Ruben Pyka MRN: [id number] DOB: 01-08-1961 Date of Office Visit: 01/01/2021  Consult requested by: Jinny Sanders, MD Primary care provider: Jinny Sanders, MD  Chief Complaint: Allergic Rhinitis  (PCP referred. Allergies all her life. Immunotherapy a few years ago about 6 years ago. Weather changes cause flare ups. ) and Other (Covid-19: Has had shortness of breath since her Covid illness.)  History of Present Illness: I had the pleasure of seeing Flecia Shutter for initial evaluation at the [facility] on 01/01/2021. She is a 60 y.o. female, who is referred here by Jinny Sanders, MD for the evaluation of allergic rhinitis.  She reports symptoms of nasal congestion, PND, coughing, sneezing, rhinorrhea, watery/itchy eyes. Symptoms have been going on for 40 years. The symptoms are present mainly in the spring and fall. Anosmia: no. Headache: yes. She has used Singulair, zyrtec, Xyzal, Flonase prn, saline rinse prn with some improvement in symptoms. Sinus infections: 4-5 per year. Previous work up includes: skin testing 7+ years ago showed multiple positives per patient report. She was on AIT for about 6 months with unknown benefit. No reactions to AIT.  Previous ENT evaluation: not recently, sinus surgery in the past.  Previous sinus imaging: no. History of nasal polyps: no. Last eye exam: November 2021. History of reflux: yes and takes meds.  10/18/2020 PCP visit: "No clear current bacterial infection.  Treat sinus pressure with prednisone.  Continue Singulair, nasal steroid, Xyzal and nasal saline irrigation.  Will refe to allergist for further treatment options."  Assessment and Plan: Zamoria is a 60 y.o. female with: Other allergic rhinitis Rhinoconjunctivitis symptoms mainly in the spring and fall for the past 40+ years.  Tried Singulair, Zyrtec, Xyzal, Flonase with some benefit.  4-5 episodes of sinusitis per year.  Skin testing over 7 years  ago showed multiple positives and was on AIT for 6 months.  Stopped due to job change. Had sinus surgery in the past.  Today's skin testing showed: Positive to weed pollen, dust mites, cockroach. Borderline to ragweed, dog, trees. Start environmental control measures as below. Continue Singulair (montelukast) 10mg  daily at night. Use over the counter antihistamines such as Zyrtec (cetirizine), Claritin (loratadine), Allegra (fexofenadine), or Xyzal (levocetirizine) daily as needed. May take twice a day during allergy flares. May switch antihistamines every few months. Use Flonase (fluticasone) nasal spray 1 spray per nostril twice a day as needed for nasal congestion.  Nasal saline spray (i.e., Simply Saline) or nasal saline lavage (i.e., NeilMed) is recommended as needed and prior to medicated nasal sprays. Let us know when ready to start injections.  Had a detailed discussion with patient/family that clinical history is suggestive of allergic rhinitis, and may benefit from allergy immunotherapy (AIT). Discussed in detail regarding the dosing, schedule, side effects (mild to moderate local allergic reaction and rarely systemic allergic reactions including anaphylaxis), and benefits (significant improvement in nasal symptoms, seasonal flares of asthma) of immunotherapy with the patient. There is significant time commitment involved with allergy shots, which includes weekly immunotherapy injections for first 9-12 months and then biweekly to monthly injections for 3-5 years. Consent was signed. Declines eye drops.  Allergic conjunctivitis of both eyes See assessment and plan as above.  Frequent episodes of sinusitis Discussed with patient that environmental allergies can cause increased episodes of sinusitis. Keep track of infections/antibiotics use.  History of bronchitis Uses albuterol prn during bronchitis episodes. Had covid-19 in 202 Today's spirometry showed: No  overt abnormalities noted  given today's efforts with no improvement in FEV1 post bronchodilator treatment. Clinically feeling unchanged.  May use albuterol rescue inhaler 2 puffs every 4 to 6 hours as needed for shortness of breath, chest tightness, coughing, and wheezing. Monitor frequency of use.   Return in about 4 months (around 05/04/2021).  No orders of the defined types were placed in this encounter.  Lab Orders  No laboratory test(s) ordered today    Other allergy screening: Asthma: no Uses albuterol during bronchitis episodes. Patient had covid-19 in 2020. Food allergy: no Medication allergy: yes Hymenoptera allergy: no Urticaria: no Eczema:no History of recurrent infections suggestive of immunodeficency: no  Diagnostics: Spirometry:  Tracings reviewed. Her effort: It was hard to get consistent efforts and there is a question as to whether this reflects a maximal maneuver. FVC: 3.14L FEV1: 2.88L, 112% predicted FEV1/FVC ratio: 92% Interpretation: No overt abnormalities noted given today's efforts with no improvement in FEV1 post bronchodilator treatment. Clinically feeling unchanged.   Please see scanned spirometry results for details.  Skin Testing: Environmental allergy panel. Positive to weed pollen, dust mites, cockroach. Borderline to ragweed, dog, trees. Results discussed with patient/family.  Airborne Adult Perc - 01/01/21 1510     Time Antigen Placed 1510    Allergen Manufacturer Lavella Hammock    Location Back    Number of Test 59    Panel 1 Select    1. Control-Buffer 50% Glycerol Negative    2. Control-Histamine 1 mg/ml 2+    3. Albumin saline Negative    4. Lincoln Negative    5. [place] Negative    6. Johnson Negative    7. Terminous Blue Negative    8. Meadow Fescue Negative    9. Perennial Rye Negative    10. Sweet Vernal Negative    11. Timothy Negative    12. Cocklebur Negative    13. Burweed Marshelder Negative    14. Ragweed, short Negative    15. Ragweed, Giant Negative     16. Plantain,  English Negative    17. Lamb's Quarters Negative    18. Sheep Sorrell Negative    19. Rough Pigweed Negative    20. Marsh Elder, Rough Negative    21. Mugwort, Common Negative    22. Ash mix Negative    23. Birch mix Negative    24. Beech American Negative    25. Box, Elder Negative    26. Cedar, red Negative    27. Cottonwood, [place] Negative    28. Elm mix Negative    29. Hickory Negative    30. Maple mix Negative    31. Oak, [place] mix Negative    32. Pecan Pollen Negative    33. Pine mix Negative    34. Sycamore Eastern Negative    35. Alamo, Black Pollen Negative    36. Alternaria alternata Negative    37. Cladosporium Herbarum Negative    38. Aspergillus mix Negative    39. Penicillium mix Negative    40. Bipolaris sorokiniana (Helminthosporium) Negative    41. Drechslera spicifera (Curvularia) Negative    42. Mucor plumbeus Negative    43. Fusarium moniliforme Negative    44. Aureobasidium pullulans (pullulara) Negative    45. Rhizopus oryzae Negative    46. Botrytis cinera Negative    47. Epicoccum nigrum Negative    48. Phoma betae Negative    49. Candida Albicans Negative    50. Trichophyton mentagrophytes Negative    51. Mite, D  Farinae  5,000 AU/ml Negative    52. Mite, D Pteronyssinus  5,000 AU/ml 2+    53. Cat Hair 10,000 BAU/ml Negative    54.  Dog Epithelia Negative    55. Mixed Feathers Negative    56. Horse Epithelia Negative    57. Cockroach, German 2+    58. Mouse Negative    59. Tobacco Leaf Negative             Intradermal - 01/01/21 1551     Time Antigen Placed 1555    Allergen Manufacturer Lavella Hammock    Location Arm    Number of Test 13    Control Negative    Guatemala Negative    Johnson Negative    7 Grass Negative    Ragweed mix --   +/-   Weed mix 2+    Tree mix --   +/-   Mold 1 Negative    Mold 2 Negative    Mold 3 Negative    Mold 4 Negative    Cat Negative    Dog --   +/-            Past Medical  History: Patient Active Problem List   Diagnosis Date Noted   Other allergic rhinitis 01/01/2021   Frequent episodes of sinusitis 01/01/2021   History of bronchitis 01/01/2021   Allergic conjunctivitis of both eyes 01/01/2021   Family history of breast cancer in sister 09/20/2020   Type 2 diabetes mellitus with other circulatory complications, HTN, CVD ([place]) 05/17/2020   Morbid obesity ([place]) 05/17/2020   BMI 40.0-44.9, adult ([place]) 05/17/2020   OSA (obstructive sleep apnea) 06/10/2016   Costochondritis 06/09/2016   Bilateral leg cramps 02/07/2016   Peripheral edema 01/29/2016   Essential hypertension    GERD (gastroesophageal reflux disease) 08/10/2014   Menopausal syndrome 08/10/2014   CAD S/P percutaneous coronary angioplasty - Ostial AVG Cx - Scoring balloon PTCA 10/03/2013   Hyperlipidemia with target LDL less than 70 10/02/2013   H/O non-ST elevation myocardial infarction (NSTEMI) 10/02/2013   Normocytic anemia, not due to blood loss    Allergic sinusitis 03/17/2007   MIGRAINE, COMMON W/O INTRACTABLE MIGRAINE 03/16/2007   Past Medical History:  Diagnosis Date   Allergy    SEASONAL   Anemia    CAD S/P percutaneous coronary angioplasty 09/2013   a) Ostial AV G Cx - 2.5 mm Angiosculpt; mid LAD 40-60%; b) Myoview 07/2014: LOW RISK, small-severe fixed inferior defect c/w infarct w/o peri-infarct ischemia.   Chronic bronchitis ([place])    "frequently; not q yr" (10/03/2013)   Diabetes mellitus without complication (HCC)    Type 2-diet controlled.    Diverticulosis    Essential hypertension    with prior Accelerated HTN   GERD (gastroesophageal reflux disease)    Hiatal hernia    Hx of non-ST elevation myocardial infarction (NSTEMI) 10/01/2013   Due to Accelerated HTN with existing CAD   Hyperlipemia    Migraine    "@ least once/month" (10/03/2013)   OSA (obstructive sleep apnea) 06/10/2016   Schatzki's ring    Seasonal allergies    Sinusitis    Spinal headache    during  C-section of second child.    Past Surgical History: Past Surgical History:  Procedure Laterality Date   ABDOMINAL HYSTERECTOMY  1994   "partial"   CAPSULAR RELEASE Right 06/06/2020   Procedure: RIGHT SHOULDER MANIPULATION UNDER ANESTHESIA, ROTATOR CUFF REPAIR;  Surgeon: Meredith Pel, MD;  Location: [facility];  Service: Orthopedics;  Laterality: Right;   CARDIAC CATHETERIZATION N/A 08/05/2015   Procedure: Left Heart Cath and Coronary Angiography;  Surgeon: [name], MD;  Location: [facility];  Service: Cardiovascular;  Laterality: N/A;   CESAREAN SECTION  1989   COLONOSCOPY     CORONARY ANGIOPLASTY  10/03/2013   95% ostial AV G Cx - 2.5 mm AngioSculpt Balloon PTCA; mid LAD 40-60%   LEFT HEART CATHETERIZATION WITH CORONARY ANGIOGRAM N/A 10/02/2013   Procedure: LEFT HEART CATHETERIZATION WITH CORONARY ANGIOGRAM;  Surgeon: [name], MD;  Location: [facility];  Service: Cardiovascular;  Laterality: N/A;   NASAL SEPTOPLASTY W/ TURBINOPLASTY  ~ 2007   NM MYOVIEW LTD  08/22/2014    Low risk stress nuclear study with a small, severe, fixed defect in the distal inferior wall/apex suggestive of small prior infarct; no ischemia.  LV Wall Motion:  NL LV Function; NL Wall Motion   PERCUTANEOUS CORONARY STENT INTERVENTION (PCI-S) N/A 10/03/2013   cutting balloon angioplasty only no stent.    SINOSCOPY     TRANSTHORACIC ECHOCARDIOGRAM  10/02/2013   EF 55-60%; mild LVH, no RWMA,    TUBAL LIGATION  1989   Medication List:  Current Outpatient Medications  Medication Sig Dispense Refill   albuterol (VENTOLIN HFA) 108 (90 Base) MCG/ACT inhaler INHALE 2 PUFFS BY MOUTH EVERY 6 HOURS AS NEEDED FOR WHEEZING OR  SHORTNESS  OF  BREATH 18 each 0   aspirin EC 81 MG EC tablet Take 1 tablet (81 mg total) by mouth daily.     BLACK ELDERBERRY PO Take 2 each by mouth daily.     carvedilol (COREG) 25 MG tablet TAKE 1 TABLET BY MOUTH TWICE DAILY WITH A MEAL 180 tablet 3   clotrimazole  (LOTRIMIN) 1 % cream Apply 1 application topically 2 (two) times daily. 30 g 0   ezetimibe (ZETIA) 10 MG tablet Take 1 tablet (10 mg total) by mouth daily. 90 tablet 3   famotidine (PEPCID) 20 MG tablet Take 20 mg by mouth daily.      fluticasone (FLONASE) 50 MCG/ACT nasal spray Place 2 sprays into both nostrils daily as needed for allergies or rhinitis.     hydrochlorothiazide (HYDRODIURIL) 25 MG tablet Take 1 tablet (25 mg total) by mouth daily. 90 tablet 3   isosorbide mononitrate (IMDUR) 60 MG 24 hr tablet Take 2 tablets (120 mg total) by mouth daily. 180 tablet 3   latanoprost (XALATAN) 0.005 % ophthalmic solution Place 1 drop into both eyes at bedtime.      levocetirizine (XYZAL) 5 MG tablet Take 5 mg by mouth daily.     losartan (COZAAR) 100 MG tablet Take 1 tablet by mouth once daily 90 tablet 3   montelukast (SINGULAIR) 10 MG tablet TAKE 1 TABLET BY MOUTH AT BEDTIME 90 tablet 3   Multiple Vitamin (MULTIVITAMIN WITH MINERALS) TABS tablet Take 1 tablet by mouth daily.     Multiple Vitamins-Minerals (EMERGEN-C IMMUNE) PACK Take 1 packet by mouth daily.     pantoprazole (PROTONIX) 40 MG tablet Take 1 tablet by mouth once daily 90 tablet 0   No current facility-administered medications for this visit.   Allergies: Allergies  Allergen Reactions   Lactose Intolerance (Gi)    Nickel Rash    Nickel jewlery causes bumps and rash on skin   Penicillins Rash    Pt states she has taken since intial  reaction without recurrence.  Has patient had a PCN reaction causing immediate  rash, facial/tongue/throat swelling, SOB or lightheadedness with hypotension:  no Has patient had a PCN reaction causing severe rash involving mucus membranes or skin necrosis: no Has patient had a PCN reaction that required hospitalization no Has patient had a PCN reaction occurring within the last 10 years: no If all of the above answers are "NO", then may proceed with Cephalospori   Social History: Social History    Socioeconomic History   Marital status: Married    Spouse name: Herbie Baltimore   Number of children: 2   Years of education: Not on file   Highest education level: Not on file  Occupational History   Occupation: Research scientist (life sciences)  Tobacco Use   Smoking status: Never   Smokeless tobacco: Never  Vaping Use   Vaping Use: Never used  Substance and Sexual Activity   Alcohol use: Yes    Alcohol/week: 0.0 standard drinks    Comment: occ   Drug use: No   Sexual activity: Yes    Birth control/protection: Surgical  Other Topics Concern   Not on file  Social History Narrative   She is a married mother of 2, grandmother 2. Her current marriage is than a pack years.    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 She never smoked and does not drink All. She does do time and balancing exercises but does not do routine of exercise.   She herself is adopted.   Social Determinants of Health   Financial Resource Strain: Not on file  Food Insecurity: Not on file  Transportation Needs: Not on file  Physical Activity: Not on file  Stress: Not on file  Social Connections: Not on file   Lives in a 59 year old apartment. Smoking: denies Occupation: Firefighter HistoryFreight forwarder in the house: no Charity fundraiser in the family room: no Carpet in the bedroom: no Heating: electric Cooling: central Pet: no  Family History: Family History  Adopted: Yes  Problem Relation Age of Onset   Allergic rhinitis Mother    Diabetes Mother    Sinusitis Mother    Breast cancer Sister    Hypertension Sister    Colon cancer Neg Hx    Heart attack Neg Hx    Stroke Neg Hx    Review of Systems  Constitutional:  Negative for appetite change, chills, fever and unexpected weight change.  HENT:  Positive for congestion, postnasal drip, rhinorrhea, sinus pressure and sneezing.   Eyes:  Positive for itching.  Respiratory:   Positive for cough. Negative for chest tightness, shortness of breath and wheezing.   Cardiovascular:  Negative for chest pain.  Gastrointestinal:  Negative for abdominal pain.  Genitourinary:  Negative for difficulty urinating.  Skin:  Negative for rash.  Allergic/Immunologic: Positive for environmental allergies.  Neurological:  Negative for headaches.   Objective: BP 124/74   Pulse 90   Temp 98.4 F (36.9 C) (Temporal)   Resp 18   Ht 5\' 9"  (1.753 m)   Wt 286 lb 12.8 oz (130.1 kg)   SpO2 96%   BMI 42.35 kg/m  Body mass index is 42.35 kg/m. Physical Exam Vitals and nursing note reviewed.  Constitutional:      Appearance: Normal appearance. She is well-developed.  HENT:     Head: Normocephalic and atraumatic.     Right Ear: Tympanic membrane and external ear normal.     Left Ear: Tympanic membrane and  external ear normal.     Nose: Nose normal.     Mouth/Throat:     Mouth: Mucous membranes are moist.     Pharynx: Oropharynx is clear.  Eyes:     Conjunctiva/sclera: Conjunctivae normal.  Cardiovascular:     Rate and Rhythm: Normal rate and regular rhythm.     Heart sounds: Normal heart sounds. No murmur heard.   No friction rub. No gallop.  Pulmonary:     Effort: Pulmonary effort is normal.     Breath sounds: Normal breath sounds. No wheezing, rhonchi or rales.  Musculoskeletal:     Cervical back: Neck supple.  Skin:    General: Skin is warm.     Findings: No rash.  Neurological:     Mental Status: She is alert and oriented to person, place, and time.  Psychiatric:        Behavior: Behavior normal.  The plan was reviewed with the patient/family, and all questions/concerned were addressed.  It was my pleasure to see Conley today and participate in her care. Please feel free to contact me with any questions or concerns.  Sincerely,  Rexene Alberts, DO Allergy & Immunology  [facility] office: [place] office:  [phone]

## 2021-01-01 ENCOUNTER — Other Ambulatory Visit: Payer: Self-pay

## 2021-01-01 ENCOUNTER — Ambulatory Visit (INDEPENDENT_AMBULATORY_CARE_PROVIDER_SITE_OTHER): Payer: BC Managed Care – PPO | Admitting: Allergy

## 2021-01-01 ENCOUNTER — Encounter: Payer: Self-pay | Admitting: Allergy

## 2021-01-01 VITALS — BP 124/74 | HR 90 | Temp 98.4°F | Resp 18 | Ht 69.0 in | Wt 286.8 lb

## 2021-01-01 DIAGNOSIS — Z8709 Personal history of other diseases of the respiratory system: Secondary | ICD-10-CM

## 2021-01-01 DIAGNOSIS — J329 Chronic sinusitis, unspecified: Secondary | ICD-10-CM | POA: Diagnosis not present

## 2021-01-01 DIAGNOSIS — J3089 Other allergic rhinitis: Secondary | ICD-10-CM | POA: Diagnosis not present

## 2021-01-01 DIAGNOSIS — H1013 Acute atopic conjunctivitis, bilateral: Secondary | ICD-10-CM

## 2021-01-01 NOTE — Assessment & Plan Note (Signed)
Uses albuterol prn during bronchitis episodes. Had covid-19 in 202  Today's spirometry showed: No overt abnormalities noted given today's efforts with no improvement in FEV1 post bronchodilator treatment. Clinically feeling unchanged.   May use albuterol rescue inhaler 2 puffs every 4 to 6 hours as needed for shortness of breath, chest tightness, coughing, and wheezing. Monitor frequency of use.

## 2021-01-01 NOTE — Patient Instructions (Addendum)
Today's skin testing showed: Positive to weed pollen, dust mites, cockroach. Borderline to ragweed, dog, trees.  Environmental allergies Start environmental control measures as below. Continue Singulair (montelukast) 10mg  daily at night. Use over the counter antihistamines such as Zyrtec (cetirizine), Claritin (loratadine), Allegra (fexofenadine), or Xyzal (levocetirizine) daily as needed. May take twice a day during allergy flares. May switch antihistamines every few months. Use Flonase (fluticasone) nasal spray 1 spray per nostril twice a day as needed for nasal congestion.  Nasal saline spray (i.e., Simply Saline) or nasal saline lavage (i.e., NeilMed) is recommended as needed and prior to medicated nasal sprays. Let us know when ready to start injections.  Had a detailed discussion with patient/family that clinical history is suggestive of allergic rhinitis, and may benefit from allergy immunotherapy (AIT). Discussed in detail regarding the dosing, schedule, side effects (mild to moderate local allergic reaction and rarely systemic allergic reactions including anaphylaxis), and benefits (significant improvement in nasal symptoms, seasonal flares of asthma) of immunotherapy with the patient. There is significant time commitment involved with allergy shots, which includes weekly immunotherapy injections for first 9-12 months and then biweekly to monthly injections for 3-5 years. Consent was signed.  Infections: Keep track of infections/antibiotics use.  Breathing: May use albuterol rescue inhaler 2 puffs every 4 to 6 hours as needed for shortness of breath, chest tightness, coughing, and wheezing. Monitor frequency of use.   Follow up in 4 months or sooner if needed.   Reducing Pollen Exposure Pollen seasons: trees (spring), grass (summer) and ragweed/weeds (fall). Keep windows closed in your home and car to lower pollen exposure.  Install air conditioning in the bedroom and throughout the  house if possible.  Avoid going out in dry windy days - especially early morning. Pollen counts are highest between 5 - 10 AM and on dry, hot and windy days.  Save outside activities for late afternoon or after a heavy rain, when pollen levels are lower.  Avoid mowing of grass if you have grass pollen allergy. Be aware that pollen can also be transported indoors on people and pets.  Dry your clothes in an automatic dryer rather than hanging them outside where they might collect pollen.  Rinse hair and eyes before bedtime. Control of House Dust Mite Allergen Dust mite allergens are a common trigger of allergy and asthma symptoms. While they can be found throughout the house, these microscopic creatures thrive in warm, humid environments such as bedding, upholstered furniture and carpeting. Because so much time is spent in the bedroom, it is essential to reduce mite levels there.  Encase pillows, mattresses, and box springs in special allergen-proof fabric covers or airtight, zippered plastic covers.  Bedding should be washed weekly in hot water (130 F) and dried in a hot dryer. Allergen-proof covers are available for comforters and pillows that can't be regularly washed.  Wash the allergy-proof covers every few months. Minimize clutter in the bedroom. Keep pets out of the bedroom.  Keep humidity less than 50% by using a dehumidifier or air conditioning. You can buy a humidity measuring device called a hygrometer to monitor this.  If possible, replace carpets with hardwood, linoleum, or washable area rugs. If that's not possible, vacuum frequently with a vacuum that has a HEPA filter. Remove all upholstered furniture and non-washable window drapes from the bedroom. Remove all non-washable stuffed toys from the bedroom.  Wash stuffed toys weekly. Cockroach Allergen Avoidance Cockroaches are often found in the homes of densely populated urban areas, schools  or commercial buildings, but these  creatures can lurk almost anywhere. This does not mean that you have a dirty house or living area. Block all areas where roaches can enter the home. This includes crevices, wall cracks and windows.  Cockroaches need water to survive, so fix and seal all leaky faucets and pipes. Have an exterminator go through the house when your family and pets are gone to eliminate any remaining roaches. Keep food in lidded containers and put pet food dishes away after your pets are done eating. Vacuum and sweep the floor after meals, and take out garbage and recyclables. Use lidded garbage containers in the kitchen. Wash dishes immediately after use and clean under stoves, refrigerators or toasters where crumbs can accumulate. Wipe off the stove and other kitchen surfaces and cupboards regularly. Pet Allergen Avoidance: Contrary to popular opinion, there are no "hypoallergenic" breeds of dogs or cats. That is because people are not allergic to an animal's hair, but to an allergen found in the animal's saliva, dander (dead skin flakes) or urine. Pet allergy symptoms typically occur within minutes. For some people, symptoms can build up and become most severe 8 to 12 hours after contact with the animal. People with severe allergies can experience reactions in public places if dander has been transported on the pet owners' clothing. Keeping an animal outdoors is only a partial solution, since homes with pets in the yard still have higher concentrations of animal allergens. Before getting a pet, ask your allergist to determine if you are allergic to animals. If your pet is already considered part of your family, try to minimize contact and keep the pet out of the bedroom and other rooms where you spend a great deal of time. As with dust mites, vacuum carpets often or replace carpet with a hardwood floor, tile or linoleum. High-efficiency particulate air (HEPA) cleaners can reduce allergen levels over time. While dander and  saliva are the source of cat and dog allergens, urine is the source of allergens from rabbits, hamsters, mice and [place] pigs; so ask a non-allergic family member to clean the animal's cage. If you have a pet allergy, talk to your allergist about the potential for allergy immunotherapy (allergy shots). This strategy can often provide long-term relief.

## 2021-01-01 NOTE — Assessment & Plan Note (Signed)
Rhinoconjunctivitis symptoms mainly in the spring and fall for the past 40+ years.  Tried Singulair, Zyrtec, Xyzal, Flonase with some benefit.  4-5 episodes of sinusitis per year.  Skin testing over 7 years ago showed multiple positives and was on AIT for 6 months.  Stopped due to job change. Had sinus surgery in the past.   Today's skin testing showed: Positive to weed pollen, dust mites, cockroach. Borderline to ragweed, dog, trees.  Start environmental control measures as below.  Continue Singulair (montelukast) 10mg  daily at night.  Use over the counter antihistamines such as Zyrtec (cetirizine), Claritin (loratadine), Allegra (fexofenadine), or Xyzal (levocetirizine) daily as needed. May take twice a day during allergy flares. May switch antihistamines every few months.  Use Flonase (fluticasone) nasal spray 1 spray per nostril twice a day as needed for nasal congestion.   Nasal saline spray (i.e., Simply Saline) or nasal saline lavage (i.e., NeilMed) is recommended as needed and prior to medicated nasal sprays. . Let us know when ready to start injections.  . Had a detailed discussion with patient/family that clinical history is suggestive of allergic rhinitis, and may benefit from allergy immunotherapy (AIT). Discussed in detail regarding the dosing, schedule, side effects (mild to moderate local allergic reaction and rarely systemic allergic reactions including anaphylaxis), and benefits (significant improvement in nasal symptoms, seasonal flares of asthma) of immunotherapy with the patient. There is significant time commitment involved with allergy shots, which includes weekly immunotherapy injections for first 9-12 months and then biweekly to monthly injections for 3-5 years. Consent was signed. . Declines eye drops.

## 2021-01-01 NOTE — Assessment & Plan Note (Signed)
Discussed with patient that environmental allergies can cause increased episodes of sinusitis. [name] Keep track of infections/antibiotics use.

## 2021-01-01 NOTE — Assessment & Plan Note (Signed)
.   See assessment and plan as above. 

## 2021-01-06 DIAGNOSIS — H401131 Primary open-angle glaucoma, bilateral, mild stage: Secondary | ICD-10-CM | POA: Diagnosis not present

## 2021-02-06 ENCOUNTER — Other Ambulatory Visit: Payer: Self-pay | Admitting: Allergy

## 2021-02-06 DIAGNOSIS — J3089 Other allergic rhinitis: Secondary | ICD-10-CM

## 2021-02-07 ENCOUNTER — Other Ambulatory Visit: Payer: Self-pay

## 2021-02-07 DIAGNOSIS — R1310 Dysphagia, unspecified: Secondary | ICD-10-CM

## 2021-02-07 DIAGNOSIS — R062 Wheezing: Secondary | ICD-10-CM

## 2021-02-07 DIAGNOSIS — I10 Essential (primary) hypertension: Secondary | ICD-10-CM

## 2021-02-07 DIAGNOSIS — J301 Allergic rhinitis due to pollen: Secondary | ICD-10-CM

## 2021-02-07 DIAGNOSIS — R0981 Nasal congestion: Secondary | ICD-10-CM

## 2021-02-07 MED ORDER — MONTELUKAST SODIUM 10 MG PO TABS: 10.0000 mg | ORAL_TABLET | Freq: Every day | ORAL | 3 refills | Status: DC

## 2021-02-07 MED ORDER — HYDROCHLOROTHIAZIDE 25 MG PO TABS: 25.0000 mg | ORAL_TABLET | Freq: Every day | ORAL | 3 refills | Status: DC

## 2021-02-07 MED ORDER — CARVEDILOL 25 MG PO TABS: ORAL_TABLET | ORAL | 3 refills | Status: DC

## 2021-02-07 MED ORDER — LOSARTAN POTASSIUM 100 MG PO TABS: 100.0000 mg | ORAL_TABLET | Freq: Every day | ORAL | 3 refills | Status: DC

## 2021-02-07 MED ORDER — ISOSORBIDE MONONITRATE ER 60 MG PO TB24: 120.0000 mg | ORAL_TABLET | Freq: Every day | ORAL | 3 refills | Status: DC

## 2021-02-07 MED ORDER — EZETIMIBE 10 MG PO TABS: 10.0000 mg | ORAL_TABLET | Freq: Every day | ORAL | 3 refills | Status: DC

## 2021-02-07 MED ORDER — ALBUTEROL SULFATE HFA 108 (90 BASE) MCG/ACT IN AERS: INHALATION_SPRAY | RESPIRATORY_TRACT | 3 refills | Status: DC

## 2021-02-07 MED ORDER — PANTOPRAZOLE SODIUM 40 MG PO TBEC: 40.0000 mg | DELAYED_RELEASE_TABLET | Freq: Every day | ORAL | 0 refills | Status: DC

## 2021-02-07 NOTE — Telephone Encounter (Signed)
Multiple refill request came in from [facility]. Last time medications were refilled to [facility].  LOV was on 10/18/20 for allergies issues. Last CPE on 05/17/20 and per those notes patient was suppose to come back for DM follow up which has not been done.  Does patient need to be set up for follow up now? NO future CPE appointment made either. Please review. Thank you

## 2021-02-11 DIAGNOSIS — J3089 Other allergic rhinitis: Secondary | ICD-10-CM | POA: Diagnosis not present

## 2021-02-11 NOTE — Progress Notes (Signed)
Aeroallergen Immunotherapy   Ordering Provider: Dr. Rexene Alberts   Patient Details  Name: Christine Barrett  MRN: [id number]  Date of Birth: 1960-09-29   Order 1 of 2   Vial Label: Rw-W-T-DM-D   0.3 ml (Volume)  1:20 Concentration -- Ragweed Mix  0.5 ml (Volume)  1:20 Concentration -- Weed Mix*  0.5 ml (Volume)  1:20 Concentration -- Eastern 10 Tree Mix (also Sweet Gum)  0.5 ml (Volume)  1:10 Concentration -- Dog Epithelia  0.5 ml (Volume)   AU Concentration -- Mite Mix (DF 5,000 & DP 5,000)    2.3  ml Extract Subtotal  2.7  ml Diluent  5.0  ml Maintenance Total   Schedule:  B   Blue Vial (1:100,000): Schedule B (6 doses)  Yellow Vial (1:10,000): Schedule B (6 doses)  Green Vial (1:1,000): Schedule B (6 doses)  Red Vial (1:100): Schedule A (10 doses)   Special Instructions: once per week

## 2021-02-11 NOTE — Progress Notes (Signed)
Aeroallergen Immunotherapy   Ordering Provider: Dr. Rexene Alberts   Patient Details  Name: Christine Barrett  MRN: [id number]  Date of Birth: 1961/04/11   Order 2 of 2   Vial Label: Cr   0.3 ml (Volume)  1:20 Concentration -- Cockroach, German    0.3  ml Extract Subtotal  4.7  ml Diluent  5.0  ml Maintenance Total   Schedule:  B  Blue Vial (1:100,000): Schedule B (6 doses)  Yellow Vial (1:10,000): Schedule B (6 doses)  Green Vial (1:1,000): Schedule B (6 doses)  Red Vial (1:100): Schedule A (10 doses)   Special Instructions: once per week

## 2021-02-11 NOTE — Progress Notes (Signed)
VIALS MADE. EXP 02-11-22

## 2021-02-12 DIAGNOSIS — J302 Other seasonal allergic rhinitis: Secondary | ICD-10-CM | POA: Diagnosis not present

## 2021-02-18 ENCOUNTER — Ambulatory Visit (HOSPITAL_BASED_OUTPATIENT_CLINIC_OR_DEPARTMENT_OTHER): Payer: BC Managed Care – PPO | Admitting: Cardiology

## 2021-02-18 ENCOUNTER — Other Ambulatory Visit: Payer: Self-pay

## 2021-02-18 ENCOUNTER — Other Ambulatory Visit (HOSPITAL_BASED_OUTPATIENT_CLINIC_OR_DEPARTMENT_OTHER): Payer: Self-pay | Admitting: Cardiology

## 2021-02-18 ENCOUNTER — Encounter (HOSPITAL_BASED_OUTPATIENT_CLINIC_OR_DEPARTMENT_OTHER): Payer: Self-pay | Admitting: Cardiology

## 2021-02-18 VITALS — BP 144/82 | HR 78 | Ht 69.0 in | Wt 280.4 lb

## 2021-02-18 DIAGNOSIS — I10 Essential (primary) hypertension: Secondary | ICD-10-CM

## 2021-02-18 DIAGNOSIS — R0602 Shortness of breath: Secondary | ICD-10-CM

## 2021-02-18 DIAGNOSIS — I2510 Atherosclerotic heart disease of native coronary artery without angina pectoris: Secondary | ICD-10-CM | POA: Diagnosis not present

## 2021-02-18 DIAGNOSIS — Z7189 Other specified counseling: Secondary | ICD-10-CM | POA: Diagnosis not present

## 2021-02-18 DIAGNOSIS — E1159 Type 2 diabetes mellitus with other circulatory complications: Secondary | ICD-10-CM

## 2021-02-18 DIAGNOSIS — E7800 Pure hypercholesterolemia, unspecified: Secondary | ICD-10-CM | POA: Diagnosis not present

## 2021-02-18 DIAGNOSIS — Z9861 Coronary angioplasty status: Secondary | ICD-10-CM

## 2021-02-18 DIAGNOSIS — Z79899 Other long term (current) drug therapy: Secondary | ICD-10-CM

## 2021-02-18 MED ORDER — AMLODIPINE BESYLATE 5 MG PO TABS: 5.0000 mg | ORAL_TABLET | Freq: Every day | ORAL | 3 refills | Status: DC

## 2021-02-18 MED ORDER — ROSUVASTATIN CALCIUM 10 MG PO TABS: 20.0000 mg | ORAL_TABLET | Freq: Every day | ORAL | 3 refills | Status: DC

## 2021-02-18 NOTE — Progress Notes (Signed)
Cardiology Office Note:    Date:  02/18/2021   ID:  Christine Barrett, DOB [birth year], MRN [id number]  PCP:  Jinny Sanders, MD  Cardiologist:  Buford Dresser, MD  Referring MD: Jinny Sanders, MD   CC: follow up   History of Present Illness:    Christine Barrett is a 60 y.o. female with a hx of CAD s/p prior PTCA, hypertension, OSA, hyperlipidemia who is seen for follow-up. I initially met her 02/12/2020 as a new consult at the request of [name], [name], MD for the evaluation and management of hypertension and coronary artery disease.   Concerns today: At home she does not check her blood pressure because she is in need of a new cuff. Her blood pressure is elevated in clinic today, and she notes she is typically busy and under stress. Previously she was on amlodipine, which was discontinued post heart attack.  In 2020 she was infected with COVID (currently has 1 booster), which she attributes to her intermittent shortness of breath. Over the last 2-3 months she is experiencing episodes of shortness of breath and chest tightness which occur 2-3 times a week.   One episode occurred while she was sitting in church. They may also occur while she is active. She has tried using her inhaler which seems to help, but is unsure if she is operating it correctly.  Also, she reports episodes of sharp chest pain, almost like a burning in her central chest. It is sore when palpated. Her most recent episode required her to lie down until her symptoms resolved.  Yesterday she had a dizzy spell with seeing white spots before her eyes. She attributes the dizzy spells to ongoing sinus issues. However, seeing the white spots is not a typical symptom for her.  She has also noticed occasional left LE edema localized in her foot. It was severe enough that she describes it as "looking like a grapefruit."  Overall, she is feeling limited in her normal physical activity.  In 05/2020 she underwent rotator cuff  surgery.  She denies any palpitations, headaches, syncope, orthopnea, or PND.    Past Medical History:  Diagnosis Date   Allergy    SEASONAL   Anemia    CAD S/P percutaneous coronary angioplasty 09/2013   a) Ostial AV G Cx - 2.5 mm Angiosculpt; mid LAD 40-60%; b) Myoview 07/2014: LOW RISK, small-severe fixed inferior defect c/w infarct w/o peri-infarct ischemia.   Chronic bronchitis ([place])    "frequently; not q yr" (10/03/2013)   Diabetes mellitus without complication (HCC)    Type 2-diet controlled.    Diverticulosis    Essential hypertension    with prior Accelerated HTN   GERD (gastroesophageal reflux disease)    Hiatal hernia    Hx of non-ST elevation myocardial infarction (NSTEMI) 10/01/2013   Due to Accelerated HTN with existing CAD   Hyperlipemia    Migraine    "@ least once/month" (10/03/2013)   OSA (obstructive sleep apnea) 06/10/2016   Schatzki's ring    Seasonal allergies    Sinusitis    Spinal headache    during C-section of second child.     Past Surgical History:  Procedure Laterality Date   ABDOMINAL HYSTERECTOMY  1994   "partial"   CAPSULAR RELEASE Right 06/06/2020   Procedure: RIGHT SHOULDER MANIPULATION UNDER ANESTHESIA, ROTATOR CUFF REPAIR;  Surgeon: Meredith Pel, MD;  Location: [facility];  Service: Orthopedics;  Laterality: Right;   CARDIAC CATHETERIZATION N/A 08/05/2015   Procedure:  Left Heart Cath and Coronary Angiography;  Surgeon: [name], MD;  Location: [facility];  Service: Cardiovascular;  Laterality: N/A;   CESAREAN SECTION  1989   COLONOSCOPY     CORONARY ANGIOPLASTY  10/03/2013   95% ostial AV G Cx - 2.5 mm AngioSculpt Balloon PTCA; mid LAD 40-60%   LEFT HEART CATHETERIZATION WITH CORONARY ANGIOGRAM N/A 10/02/2013   Procedure: LEFT HEART CATHETERIZATION WITH CORONARY ANGIOGRAM;  Surgeon: [name], MD;  Location: [facility] C[facility];  Service: Cardiovascular;  Laterality: N/A;   NASAL SEPTOPLASTY W/ TURBINOPLASTY  ~ 2007   NM  MYOVIEW LTD  08/22/2014    Low risk stress nuclear study with a small, severe, fixed defect in the distal inferior wall/apex suggestive of small prior infarct; no ischemia.  LV Wall Motion:  NL LV Function; NL Wall Motion   PERCUTANEOUS CORONARY STENT INTERVENTION (PCI-S) N/A 10/03/2013   cutting balloon angioplasty only no stent.    SINOSCOPY     TRANSTHORACIC ECHOCARDIOGRAM  10/02/2013   EF 55-60%; mild LVH, no RWMA,    TUBAL LIGATION  1989    Current Medications: Current Outpatient Medications on File Prior to Visit  Medication Sig   albuterol (VENTOLIN HFA) 108 (90 Base) MCG/ACT inhaler INHALE 2 PUFFS BY MOUTH EVERY 6 HOURS AS NEEDED FOR WHEEZING OR  SHORTNESS  OF  BREATH   aspirin EC 81 MG EC tablet Take 1 tablet (81 mg total) by mouth daily.   BLACK ELDERBERRY PO Take 2 each by mouth daily.   carvedilol (COREG) 25 MG tablet TAKE 1 TABLET BY MOUTH TWICE DAILY WITH A MEAL   clotrimazole (LOTRIMIN) 1 % cream Apply 1 application topically 2 (two) times daily.   ezetimibe (ZETIA) 10 MG tablet Take 1 tablet (10 mg total) by mouth daily.   famotidine (PEPCID) 20 MG tablet Take 20 mg by mouth daily.    fluticasone (FLONASE) 50 MCG/ACT nasal spray Place 2 sprays into both nostrils daily as needed for allergies or rhinitis.   hydrochlorothiazide (HYDRODIURIL) 25 MG tablet Take 1 tablet (25 mg total) by mouth daily.   isosorbide mononitrate (IMDUR) 60 MG 24 hr tablet Take 2 tablets (120 mg total) by mouth daily.   latanoprost (XALATAN) 0.005 % ophthalmic solution Place 1 drop into both eyes at bedtime.    levocetirizine (XYZAL) 5 MG tablet Take 5 mg by mouth daily.   losartan (COZAAR) 100 MG tablet Take 1 tablet (100 mg total) by mouth daily.   montelukast (SINGULAIR) 10 MG tablet Take 1 tablet (10 mg total) by mouth at bedtime.   Multiple Vitamin (MULTIVITAMIN WITH MINERALS) TABS tablet Take 1 tablet by mouth daily.   Multiple Vitamins-Minerals (EMERGEN-C IMMUNE) PACK Take 1 packet by mouth  daily.   pantoprazole (PROTONIX) 40 MG tablet Take 1 tablet (40 mg total) by mouth daily.   No current facility-administered medications on file prior to visit.     Allergies:   Lactose intolerance (gi), Nickel, and Penicillins   Social History   Tobacco Use   Smoking status: Never   Smokeless tobacco: Never  Vaping Use   Vaping Use: Never used  Substance Use Topics   Alcohol use: Yes    Alcohol/week: 0.0 standard drinks    Comment: occ   Drug use: No    Family History: family history includes Allergic rhinitis in her mother; Breast cancer in her sister; Diabetes in her mother; Hypertension in her sister; Sinusitis in her mother. There is no  history of Colon cancer, Heart attack, or Stroke. She was adopted.  ROS:   Please see the history of present illness. (+) Shortness of breath (+) Chest tightness (+) Sharp, central chest pain (+) Dizziness (+) Vision changes (+) Left LE edema, foot (+) Stress All other systems are reviewed and negative.    EKGs/Labs/Other Studies Reviewed:    The following studies were reviewed today:  [place] LE Venous 01/15/2020: Summary:  RIGHT:  - No evidence of common femoral vein obstruction.     LEFT:  - There is no evidence of deep vein thrombosis in the lower extremity.     - No cystic structure found in the popliteal fossa.   CUS Exercise Stress Echo (CE) 06/25/2016: SUMMARY  The baseline ECG displays normal sinus rhythm.  The baseline ECG displays abnormal T waves in the lateral leads.  Normal left ventricular function at rest.  There were no segmental wall motion abnormalities at rest  The estimated LV ejection fraction is 55-60% .  No diagnostic ST segment changes were seen.  The patient achieved 96% % of maximum predicted heart rate.  The METS achieved was 7.  Exercise capacity was fair.  Normal left ventricular function and global wall motion with stress.  There were no segmental wall motion abnormalities post exercise   Negative stress ECG for inducible ischemia at target heart rate.  Negative exercise echocardiography for inducible ischemia at target heart  rate.  ECG changes, blood pressure and heart rate responses during stress test are  shown in the Cardiology Scan portion of this report in the EPIC   [facility] 08/05/2015 Ost 2nd Diag to 2nd Diag lesion, 80% stenosed. 2 small for PCI Prox LAD to Mid LAD lesion, 60% stenosed. Prox Cx to Mid Cx lesion, 50% stenosed. The lesion was previously treated with angioplasty one to two years ago.    Essentially stable coronary artery disease as compared to last catheterization. The PTCA site does have some restenosis but only 50% and a very focal segment.   Potential culprit for angiogram would be the diagonal lesion that is not amenable to PCI. If the patient were to have worsening symptoms, would recommend noninvasive stress testing to evaluate the LAD which appears to be stable from 2015. FFR and LV gram not performed due to concern for radial/brachial spasm.   Plan: 3 Standard post radial cath care with TR band removal  Discharge after bedrest and TR band removal Increase Imdur dose to 120 mg Consider adding diuretic    She will f/u with me or APP post cath for medication titration   EKG:  EKG is personally reviewed.   02/18/2021: Sinus rhythm, nonspecific t wave pattern. Rate 78 bpm. 02/12/2020: sinus rhythm at 82 bpm with nonspecific t wave pattern  Recent Labs: 05/09/2020: ALT 18 06/04/2020: BUN 10; Creatinine, Ser 0.69; Hemoglobin 12.7; Platelets 255; Potassium 3.8; Sodium 140  Recent Lipid Panel    Component Value Date/Time   CHOL 206 (H) 05/09/2020 0854   TRIG 143.0 05/09/2020 0854   HDL 44.90 05/09/2020 0854   CHOLHDL 5 05/09/2020 0854   VLDL 28.6 05/09/2020 0854   LDLCALC 133 (H) 05/09/2020 0854   LDLCALC 151 (H) 05/08/2019 0824   LDLDIRECT 192.2 02/11/2009 1439    Physical Exam:    VS:  BP (!) 144/82 (BP Location: Right Arm, Patient  Position: Sitting)   Pulse 78   Ht $R'5\' 9"'IR$  (1.753 m)   Wt 280 lb 6.4 oz (127.2 kg)   SpO2  98%   BMI 41.41 kg/m     Wt Readings from Last 3 Encounters:  02/18/21 280 lb 6.4 oz (127.2 kg)  01/01/21 286 lb 12.8 oz (130.1 kg)  10/18/20 285 lb (129.3 kg)    GEN: Well nourished, well developed in no acute distress HEENT: Normal, moist mucous membranes NECK: No JVD CARDIAC: regular rhythm, normal S1 and S2, no rubs or gallops. No murmur. VASCULAR: Radial and DP pulses 2+ bilaterally. No carotid bruits RESPIRATORY:  Clear to auscultation without rales, wheezing or rhonchi  ABDOMEN: Soft, non-tender, non-distended MUSCULOSKELETAL:  Ambulates independently SKIN: Warm and dry, no edema NEUROLOGIC:  Alert and oriented x 3. No focal neuro deficits noted. PSYCHIATRIC:  Normal affect    ASSESSMENT:    1. CAD S/P percutaneous coronary angioplasty - Ostial AVG Cx - Scoring balloon PTCA   2. Essential hypertension   3. Pure hypercholesterolemia   4. Cardiac risk counseling   5. Counseling on health promotion and disease prevention   6. Type 2 diabetes mellitus with other circulatory complications, HTN, CVD (HCC)   7. Medication management   8. Shortness of breath     PLAN:    CAD with prior PTCA Costochondritis -has not had angina, has had tender costochondritis. Reviewed red flag signs that need immediate medical attention -continue aspirin, beta blocker, nitrate -avoid NSAIDs -see below re: lipids  Intermittent shortness of breath: not typically exertional, improves with her inhaler and/or deep breathing -working on BP management, then would encourage exercise to see if this improves her symptoms -exam unremarkable today  Hypertension: elevated today -add amlodipine 5 mg daily -continue carvedilol, HCTZ, isosorbide, losartan  Hyperlipidemia: goal LDL <70, last LDL 133 per [facility] -continue ezetimibe 10 mg daily -stopped atorvastatin due to muscle cramps. Will trial rosuvastatin,  slow ramp to goal -recheck  once 2 mos at goal dose, prior to next visit  Type II diabetes: -with ASCVD, would recommend either SGLT2i or GLP1RA -she is interested in weight loss, does not have a history of heart failure. We discussed GLP1RA today. Re-discuss at follow up in 3 mos, as we are adding two other medications today -last A1c 6.8  OSA: recommended use of CPAP  Cardiac risk counseling and prevention recommendations: -recommend heart healthy/Mediterranean diet, with whole grains, fruits, vegetable, fish, lean meats, nuts, and olive oil. Limit salt. -recommend moderate walking, 3-5 times/week for 30-50 minutes each session. Aim for at least 150 minutes.week. Goal should be pace of 3 miles/hours, or walking 1.5 miles in 30 minutes -recommend avoidance of tobacco products. Avoid excess alcohol. -  Plan for follow up: 3 months or sooner as needed.  Total time of encounter: 40 minutes total time of encounter, including 30 minutes spent in face-to-face patient care. This time includes coordination of care and counseling regarding symptoms, recommendations for management. Remainder of non-face-to-face time involved reviewing chart documents/testing relevant to the patient encounter and documentation in the medical record.  Jodelle Red, MD, PhD, [facility]  [facility]    Medication Adjustments/Labs and Tests Ordered: Current medicines are reviewed at length with the patient today.  Concerns regarding medicines are outlined above.   Orders Placed This Encounter  Procedures   Lipid panel   EKG 12-Lead    Meds ordered this encounter  Medications   DISCONTD: rosuvastatin (CRESTOR) 10 MG tablet    Sig: Take 2 tablets (20 mg total) by mouth daily.    Dispense:  180 tablet    Refill:  3  DISCONTD: amLODipine (NORVASC) 5 MG tablet    Sig: Take 1 tablet (5 mg total) by mouth daily.    Dispense:  180 tablet    Refill:  3   rosuvastatin (CRESTOR) 10 MG tablet     Sig: Take 2 tablets (20 mg total) by mouth daily.    Dispense:  180 tablet    Refill:  3   amLODipine (NORVASC) 5 MG tablet    Sig: Take 1 tablet (5 mg total) by mouth daily.    Dispense:  180 tablet    Refill:  3    Patient Instructions  Medication Instructions:  Start amlodipine 5 mg daily Start Rosuvastatin  Week 1: Take 5 mg every other day  Week 2: Take 5 mg daily  Week 3: Take 10 mg daily  Week 4: Take 20 mg daily  *If you need a refill on your cardiac medications before your next appointment, please call your pharmacy*   Lab Work: Your physician recommends that you return for lab work in 3 months at our [facility] ([place], [place] [address], no lab appointment needed)    If you have labs (blood work) drawn today and your tests are completely normal, you will receive your results only by: MyChart Message (if you have MyChart) OR A paper copy in the mail If you have any lab test that is abnormal or we need to change your treatment, we will call you to review the results.   Testing/Procedures: None ordered today   Follow-Up: At [facility], you and your health needs are our priority.  As part of our continuing mission to provide you with exceptional heart care, we have created designated Provider Care Teams.  These Care Teams include your primary Cardiologist (physician) and Advanced Practice Providers (APPs -  Physician Assistants and Nurse Practitioners) who all work together to provide you with the care you need, when you need it.  We recommend signing up for the patient portal called "MyChart".  Sign up information is provided on this After Visit Summary.  MyChart is used to connect with patients for Virtual Visits (Telemedicine).  Patients are able to view lab/test results, encounter notes, upcoming appointments, etc.  Non-urgent messages can be sent to your provider as well.   To learn more about what you can do with MyChart, go to  [email or web address].    Your next appointment:   3 month(s)  The format for your next appointment:   In Person  Provider:   Buford Dresser, MD     [facility],acting as a scribe for Buford Dresser, MD.,have documented all relevant documentation on the behalf of Buford Dresser, MD,as directed by  Buford Dresser, MD while in the presence of Buford Dresser, MD.  I, Buford Dresser, MD, have reviewed all documentation for this visit. The documentation on 02/18/21 for the exam, diagnosis, procedures, and orders are all accurate and complete.   Signed, Buford Dresser, MD PhD 02/18/2021  [facility]

## 2021-02-18 NOTE — Patient Instructions (Signed)
Medication Instructions:  Start amlodipine 5 mg daily Start Rosuvastatin  Week 1: Take 5 mg every other day  Week 2: Take 5 mg daily  Week 3: Take 10 mg daily  Week 4: Take 20 mg daily  *If you need a refill on your cardiac medications before your next appointment, please call your pharmacy*   Lab Work: Your physician recommends that you return for lab work in 3 months at our [facility] ([place], [place] [address], no lab appointment needed)    If you have labs (blood work) drawn today and your tests are completely normal, you will receive your results only by: MyChart Message (if you have MyChart) OR A paper copy in the mail If you have any lab test that is abnormal or we need to change your treatment, we will call you to review the results.   Testing/Procedures: None ordered today   Follow-Up: At [facility], you and your health needs are our priority.  As part of our continuing mission to provide you with exceptional heart care, we have created designated Provider Care Teams.  These Care Teams include your primary Cardiologist (physician) and Advanced Practice Providers (APPs -  Physician Assistants and Nurse Practitioners) who all work together to provide you with the care you need, when you need it.  We recommend signing up for the patient portal called "MyChart".  Sign up information is provided on this After Visit Summary.  MyChart is used to connect with patients for Virtual Visits (Telemedicine).  Patients are able to view lab/test results, encounter notes, upcoming appointments, etc.  Non-urgent messages can be sent to your provider as well.   To learn more about what you can do with MyChart, go to [email or web address].    Your next appointment:   3 month(s)  The format for your next appointment:   In Person  Provider:   Buford Dresser, MD

## 2021-02-20 ENCOUNTER — Other Ambulatory Visit: Payer: Self-pay

## 2021-02-20 ENCOUNTER — Ambulatory Visit: Payer: BC Managed Care – PPO | Admitting: *Deleted

## 2021-02-20 DIAGNOSIS — J309 Allergic rhinitis, unspecified: Secondary | ICD-10-CM | POA: Diagnosis not present

## 2021-02-20 MED ORDER — EPINEPHRINE 0.3 MG/0.3ML IJ SOAJ: 0.3000 mg | Freq: Once | INTRAMUSCULAR | 1 refills | Status: AC

## 2021-02-20 NOTE — Progress Notes (Signed)
Immunotherapy   Patient Details  Name: Christine Barrett MRN: [id number] Date of Birth: 12-Sep-1960  02/20/2021  Christine Barrett started injections for  RW-W-T-DM-D, CR Following schedule: B  Frequency:1 time per week Epi-Pen:Epi-Pen Available  Consent signed and patient instructions given. Patient started allergy injections and received 0.40m of RW-W-T-DM-D in the RUA and .080mof CR in the LUA. Patient waited 30 minutes in office and did not experience any issues.   Khamya Topp Fernandez-Vernon 02/20/2021, 5:22 PM

## 2021-04-07 ENCOUNTER — Telehealth: Payer: Self-pay | Admitting: Family Medicine

## 2021-04-07 NOTE — Telephone Encounter (Signed)
Called pat and got her scheduled for 10/11 at 3

## 2021-04-07 NOTE — Telephone Encounter (Signed)
Pt is wanting a call back to schedule an appointment for L leg and hand pain.

## 2021-04-08 ENCOUNTER — Encounter: Payer: Self-pay | Admitting: Family Medicine

## 2021-04-08 ENCOUNTER — Other Ambulatory Visit: Payer: Self-pay

## 2021-04-08 ENCOUNTER — Ambulatory Visit: Payer: BC Managed Care – PPO | Admitting: Family Medicine

## 2021-04-08 VITALS — BP 128/76 | HR 93 | Temp 97.6°F | Ht 69.0 in | Wt 288.6 lb

## 2021-04-08 DIAGNOSIS — M25562 Pain in left knee: Secondary | ICD-10-CM

## 2021-04-08 DIAGNOSIS — M7062 Trochanteric bursitis, left hip: Secondary | ICD-10-CM

## 2021-04-08 DIAGNOSIS — M79645 Pain in left finger(s): Secondary | ICD-10-CM | POA: Diagnosis not present

## 2021-04-08 DIAGNOSIS — M25561 Pain in right knee: Secondary | ICD-10-CM | POA: Diagnosis not present

## 2021-04-08 MED ORDER — NITROFURANTOIN MONOHYD MACRO 100 MG PO CAPS: 100.0000 mg | ORAL_CAPSULE | Freq: Two times a day (BID) | ORAL | 0 refills | Status: DC

## 2021-04-08 MED ORDER — PREDNISONE 20 MG PO TABS: ORAL_TABLET | ORAL | 0 refills | Status: DC

## 2021-04-08 NOTE — Assessment & Plan Note (Signed)
Most likely due to OA. Can treat with topical voltaren gel QID prn.

## 2021-04-08 NOTE — Patient Instructions (Addendum)
Complete course of prednisone for left hip bursitis.  Start glucosamine 500 mg three times daily OTC.  Can apply topical Voltaren cream to smaller joints 4 time daily   Start home stretching.  Call if not improving as expected for referral to ortho for possible steroid injection.

## 2021-04-08 NOTE — Progress Notes (Signed)
Patient ID: Christine Barrett, female    DOB: July 07, 1960, 60 y.o.   MRN: [id number]  This visit was conducted in person.  BP 128/76   Pulse 93   Temp 97.6 F (36.4 C) (Temporal)   Ht 5\' 9"  (1.753 m)   Wt 288 lb 9.6 oz (130.9 kg)   SpO2 97%   BMI 42.62 kg/m    CC: Chief Complaint  Patient presents with   Leg Pain    Subjective:   HPI: Christine Barrett is a 60 y.o. female  with history of  CAD, DM2, HTN, obesity, presenting on 04/08/2021 for Leg Pain  She has noted pain in lateral left hip. Radiates to left lower leg. Wrose in last 3-4 month since getting back to walking.  No buttock pain.  Using cane to walk.   Both knees with pain as well.  Left hand 3rd digit PIP is  stiff.  Occ joint swelling in finger. No redness.   Using tylenol and ibuprofen, voltaren gel has not helped  much either.   Worse if standing for a prolonged time.   2021 hip and lumbar X-ray: Mild degenerative changes noted in both hips without  bone-on-bone arthritis.  No acute fracture.  Lumbar facet arthritis is  present in the lower lumbar spine region. 2021 left knee:  IMPRESSION: Mild tricompartment osteoarthritis.      Relevant past medical, surgical, family and social history reviewed and updated as indicated. Interim medical history since our last visit reviewed. Allergies and medications reviewed and updated. Outpatient Medications Prior to Visit  Medication Sig Dispense Refill   albuterol (VENTOLIN HFA) 108 (90 Base) MCG/ACT inhaler INHALE 2 PUFFS BY MOUTH EVERY 6 HOURS AS NEEDED FOR WHEEZING OR  SHORTNESS  OF  BREATH 18 g 3   amLODipine (NORVASC) 5 MG tablet Take 1 tablet (5 mg total) by mouth daily. 180 tablet 3   aspirin EC 81 MG EC tablet Take 1 tablet (81 mg total) by mouth daily.     BLACK ELDERBERRY PO Take 2 each by mouth daily.     carvedilol (COREG) 25 MG tablet TAKE 1 TABLET BY MOUTH TWICE DAILY WITH A MEAL 180 tablet 3   clotrimazole (LOTRIMIN) 1 % cream Apply 1 application  topically 2 (two) times daily. 30 g 0   ezetimibe (ZETIA) 10 MG tablet Take 1 tablet (10 mg total) by mouth daily. 90 tablet 3   famotidine (PEPCID) 20 MG tablet Take 20 mg by mouth daily.      fluticasone (FLONASE) 50 MCG/ACT nasal spray Place 2 sprays into both nostrils daily as needed for allergies or rhinitis.     hydrochlorothiazide (HYDRODIURIL) 25 MG tablet Take 1 tablet (25 mg total) by mouth daily. 90 tablet 3   isosorbide mononitrate (IMDUR) 60 MG 24 hr tablet Take 2 tablets (120 mg total) by mouth daily. 180 tablet 3   latanoprost (XALATAN) 0.005 % ophthalmic solution Place 1 drop into both eyes at bedtime.      levocetirizine (XYZAL) 5 MG tablet Take 5 mg by mouth daily.     losartan (COZAAR) 100 MG tablet Take 1 tablet (100 mg total) by mouth daily. 90 tablet 3   montelukast (SINGULAIR) 10 MG tablet Take 1 tablet (10 mg total) by mouth at bedtime. 90 tablet 3   Multiple Vitamin (MULTIVITAMIN WITH MINERALS) TABS tablet Take 1 tablet by mouth daily.     Multiple Vitamins-Minerals (EMERGEN-C IMMUNE) PACK Take 1 packet by mouth daily.  pantoprazole (PROTONIX) 40 MG tablet Take 1 tablet (40 mg total) by mouth daily. 90 tablet 0   rosuvastatin (CRESTOR) 10 MG tablet Take 2 tablets (20 mg total) by mouth daily. 180 tablet 3   No facility-administered medications prior to visit.     Per HPI unless specifically indicated in ROS section below Review of Systems  Constitutional:  Negative for fatigue and fever.  HENT:  Negative for congestion.   Eyes:  Negative for pain.  Respiratory:  Negative for cough and shortness of breath.   Cardiovascular:  Negative for chest pain, palpitations and leg swelling.  Gastrointestinal:  Negative for abdominal pain.  Genitourinary:  Negative for dysuria and vaginal bleeding.  Musculoskeletal:  Negative for back pain.  Neurological:  Negative for syncope, light-headedness and headaches.  Psychiatric/Behavioral:  Negative for dysphoric mood.    Objective:  BP 128/76   Pulse 93   Temp 97.6 F (36.4 C) (Temporal)   Ht 5\' 9"  (1.753 m)   Wt 288 lb 9.6 oz (130.9 kg)   SpO2 97%   BMI 42.62 kg/m   Wt Readings from Last 3 Encounters:  04/08/21 288 lb 9.6 oz (130.9 kg)  02/18/21 280 lb 6.4 oz (127.2 kg)  01/01/21 286 lb 12.8 oz (130.1 kg)      Physical Exam Constitutional:      General: She is not in acute distress.    Appearance: Normal appearance. She is well-developed. She is not ill-appearing or toxic-appearing.  HENT:     Head: Normocephalic.     Right Ear: Hearing, tympanic membrane, ear canal and external ear normal. Tympanic membrane is not erythematous, retracted or bulging.     Left Ear: Hearing, tympanic membrane, ear canal and external ear normal. Tympanic membrane is not erythematous, retracted or bulging.     Nose: No mucosal edema or rhinorrhea.     Right Sinus: No maxillary sinus tenderness or frontal sinus tenderness.     Left Sinus: No maxillary sinus tenderness or frontal sinus tenderness.     Mouth/Throat:     Pharynx: Uvula midline.  Eyes:     General: Lids are normal. Lids are everted, no foreign bodies appreciated.     Conjunctiva/sclera: Conjunctivae normal.     Pupils: Pupils are equal, round, and reactive to light.  Neck:     Thyroid: No thyroid mass or thyromegaly.     Vascular: No carotid bruit.     Trachea: Trachea normal.  Cardiovascular:     Rate and Rhythm: Normal rate and regular rhythm.     Pulses: Normal pulses.     Heart sounds: Normal heart sounds, S1 normal and S2 normal. No murmur heard.   No friction rub. No gallop.  Pulmonary:     Effort: Pulmonary effort is normal. No tachypnea or respiratory distress.     Breath sounds: Normal breath sounds. No decreased breath sounds, wheezing, rhonchi or rales.  Abdominal:     General: Bowel sounds are normal.     Palpations: Abdomen is soft.     Tenderness: There is no abdominal tenderness.  Musculoskeletal:     Cervical back: Normal,  normal range of motion and neck supple.     Thoracic back: Normal.     Lumbar back: Normal.     Right hip: Normal.     Left hip: Tenderness present. Decreased range of motion.     Comments: Tttp laterally over trochanteric bursa.  Left 3rd PIUP without redness or swelling  Skin:  General: Skin is warm and dry.     Findings: No rash.  Neurological:     Mental Status: She is alert.  Psychiatric:        Mood and Affect: Mood is not anxious or depressed.        Speech: Speech normal.        Behavior: Behavior normal. Behavior is cooperative.        Thought Content: Thought content normal.        Judgment: Judgment normal.      Results for orders placed or performed in visit on 11/15/20  HM DIABETES EYE EXAM  Result Value Ref Range   HM Diabetic Eye Exam No Retinopathy No Retinopathy    This visit occurred during the SARS-CoV-2 public health emergency.  Safety protocols were in place, including screening questions prior to the visit, additional usage of staff PPE, and extensive cleaning of exam room while observing appropriate contact time as indicated for disinfecting solutions.   COVID 19 screen:  No recent travel or known exposure to COVID19 The patient denies respiratory symptoms of COVID 19 at this time. The importance of social distancing was discussed today.   Assessment and Plan Problem List Items Addressed This Visit     Acute pain of both knees    Most likely due to OA pain  As seen in previous X-rays.  Treat with topical voltaren gel, consider steroid injection if not improving.      Finger pain, left    Most likely due to OA. Can treat with topical voltaren gel QID prn.      Trochanteric bursitis of left hip - Primary    Has history of left OA in hip..  Current pain is more likely flare of left hip bursitis given location and nature of pain. Treat with prednisone taper given NSAIDS not ideal in CAD. Start home stretches, info given.  If not improving refer to  [facility] for steroid injection.      Meds ordered this encounter  Medications   DISCONTD: nitrofurantoin, macrocrystal-monohydrate, (MACROBID) 100 MG capsule    Sig: Take 1 capsule (100 mg total) by mouth 2 (two) times daily.    Dispense:  10 capsule    Refill:  0   predniSONE (DELTASONE) 20 MG tablet    Sig: 3 tabs by mouth daily x 3 days, then 2 tabs by mouth daily x 2 days then 1 tab by mouth daily x 2 days    Dispense:  15 tablet    Refill:  0        Eliezer Lofts, MD

## 2021-04-08 NOTE — Assessment & Plan Note (Signed)
Most likely due to OA pain  As seen in previous X-rays.  Treat with topical voltaren gel, consider steroid injection if not improving.

## 2021-04-08 NOTE — Assessment & Plan Note (Signed)
Has history of left OA in hip..  Current pain is more likely flare of left hip bursitis given location and nature of pain. Treat with prednisone taper given NSAIDS not ideal in CAD. Start home stretches, info given.  If not improving refer to [facility] for steroid injection.

## 2021-04-30 ENCOUNTER — Encounter (HOSPITAL_BASED_OUTPATIENT_CLINIC_OR_DEPARTMENT_OTHER): Payer: Self-pay

## 2021-05-05 ENCOUNTER — Ambulatory Visit: Payer: BC Managed Care – PPO | Admitting: Allergy

## 2021-05-05 DIAGNOSIS — J309 Allergic rhinitis, unspecified: Secondary | ICD-10-CM

## 2021-05-20 NOTE — Progress Notes (Incomplete)
Cardiology Office Note:    Date:  05/20/2021   ID:  Christine Barrett, DOB [birth year], MRN [id number]  PCP:  Jinny Sanders, MD  Cardiologist:  Buford Dresser, MD  Referring MD: Jinny Sanders, MD   CC: follow up  History of Present Illness:    Christine Barrett is a 60 y.o. female with a hx of CAD s/p prior PTCA, hypertension, OSA, hyperlipidemia who is seen for follow-up. I initially met her 02/12/2020 as a new consult at the request of [name], [name], MD for the evaluation and management of hypertension and coronary artery disease.   Concerns today: At home she does not check her blood pressure because she is in need of a new cuff. Her blood pressure is elevated in clinic today, and she notes she is typically busy and under stress. Previously she was on amlodipine, which was discontinued post heart attack.  In 2020 she was infected with COVID (currently has 1 booster), which she attributes to her intermittent shortness of breath. Over the last 2-3 months she is experiencing episodes of shortness of breath and chest tightness which occur 2-3 times a week.   One episode occurred while she was sitting in church. They may also occur while she is active. She has tried using her inhaler which seems to help, but is unsure if she is operating it correctly.  Also, she reports episodes of sharp chest pain, almost like a burning in her central chest. It is sore when palpated. Her most recent episode required her to lie down until her symptoms resolved.  Yesterday she had a dizzy spell with seeing white spots before her eyes. She attributes the dizzy spells to ongoing sinus issues. However, seeing the white spots is not a typical symptom for her.  She has also noticed occasional left LE edema localized in her foot. It was severe enough that she describes it as "looking like a grapefruit."  Overall, she is feeling limited in her normal physical activity.  In 05/2020 she underwent rotator cuff  surgery.  Today:   She denies any palpitations, chest pain, or shortness of breath. No lightheadedness, headaches, syncope, orthopnea, PND, lower extremity edema or exertional symptoms.  (+)  Past Medical History:  Diagnosis Date   Allergy    SEASONAL   Anemia    CAD S/P percutaneous coronary angioplasty 09/2013   a) Ostial AV G Cx - 2.5 mm Angiosculpt; mid LAD 40-60%; b) Myoview 07/2014: LOW RISK, small-severe fixed inferior defect c/w infarct w/o peri-infarct ischemia.   Chronic bronchitis ([place])    "frequently; not q yr" (10/03/2013)   Diabetes mellitus without complication (HCC)    Type 2-diet controlled.    Diverticulosis    Essential hypertension    with prior Accelerated HTN   GERD (gastroesophageal reflux disease)    Hiatal hernia    Hx of non-ST elevation myocardial infarction (NSTEMI) 10/01/2013   Due to Accelerated HTN with existing CAD   Hyperlipemia    Migraine    "@ least once/month" (10/03/2013)   OSA (obstructive sleep apnea) 06/10/2016   Schatzki's ring    Seasonal allergies    Sinusitis    Spinal headache    during C-section of second child.     Past Surgical History:  Procedure Laterality Date   ABDOMINAL HYSTERECTOMY  1994   "partial"   CAPSULAR RELEASE Right 06/06/2020   Procedure: RIGHT SHOULDER MANIPULATION UNDER ANESTHESIA, ROTATOR CUFF REPAIR;  Surgeon: Meredith Pel, MD;  Location: [facility]  OR;  Service: Orthopedics;  Laterality: Right;   CARDIAC CATHETERIZATION N/A 08/05/2015   Procedure: Left Heart Cath and Coronary Angiography;  Surgeon: [name], MD;  Location: [facility] LAB;  Service: Cardiovascular;  Laterality: N/A;   CESAREAN SECTION  1989   COLONOSCOPY     CORONARY ANGIOPLASTY  10/03/2013   95% ostial AV G Cx - 2.5 mm AngioSculpt Balloon PTCA; mid LAD 40-60%   LEFT HEART CATHETERIZATION WITH CORONARY ANGIOGRAM N/A 10/02/2013   Procedure: LEFT HEART CATHETERIZATION WITH CORONARY ANGIOGRAM;  Surgeon: [name], MD;   Location: [facility] LAB;  Service: Cardiovascular;  Laterality: N/A;   NASAL SEPTOPLASTY W/ TURBINOPLASTY  ~ 2007   NM MYOVIEW LTD  08/22/2014    Low risk stress nuclear study with a small, severe, fixed defect in the distal inferior wall/apex suggestive of small prior infarct; no ischemia.  LV Wall Motion:  NL LV Function; NL Wall Motion   PERCUTANEOUS CORONARY STENT INTERVENTION (PCI-S) N/A 10/03/2013   cutting balloon angioplasty only no stent.    SINOSCOPY     TRANSTHORACIC ECHOCARDIOGRAM  10/02/2013   EF 55-60%; mild LVH, no RWMA,    TUBAL LIGATION  1989    Current Medications: Current Outpatient Medications on File Prior to Visit  Medication Sig   albuterol (VENTOLIN HFA) 108 (90 Base) MCG/ACT inhaler INHALE 2 PUFFS BY MOUTH EVERY 6 HOURS AS NEEDED FOR WHEEZING OR  SHORTNESS  OF  BREATH   amLODipine (NORVASC) 5 MG tablet Take 1 tablet (5 mg total) by mouth daily.   aspirin EC 81 MG EC tablet Take 1 tablet (81 mg total) by mouth daily.   BLACK ELDERBERRY PO Take 2 each by mouth daily.   carvedilol (COREG) 25 MG tablet TAKE 1 TABLET BY MOUTH TWICE DAILY WITH A MEAL   clotrimazole (LOTRIMIN) 1 % cream Apply 1 application topically 2 (two) times daily.   ezetimibe (ZETIA) 10 MG tablet Take 1 tablet (10 mg total) by mouth daily.   famotidine (PEPCID) 20 MG tablet Take 20 mg by mouth daily.    fluticasone (FLONASE) 50 MCG/ACT nasal spray Place 2 sprays into both nostrils daily as needed for allergies or rhinitis.   hydrochlorothiazide (HYDRODIURIL) 25 MG tablet Take 1 tablet (25 mg total) by mouth daily.   isosorbide mononitrate (IMDUR) 60 MG 24 hr tablet Take 2 tablets (120 mg total) by mouth daily.   latanoprost (XALATAN) 0.005 % ophthalmic solution Place 1 drop into both eyes at bedtime.    levocetirizine (XYZAL) 5 MG tablet Take 5 mg by mouth daily.   losartan (COZAAR) 100 MG tablet Take 1 tablet (100 mg total) by mouth daily.   montelukast (SINGULAIR) 10 MG tablet Take 1 tablet (10 mg  total) by mouth at bedtime.   Multiple Vitamin (MULTIVITAMIN WITH MINERALS) TABS tablet Take 1 tablet by mouth daily.   Multiple Vitamins-Minerals (EMERGEN-C IMMUNE) PACK Take 1 packet by mouth daily.   pantoprazole (PROTONIX) 40 MG tablet Take 1 tablet (40 mg total) by mouth daily.   predniSONE (DELTASONE) 20 MG tablet 3 tabs by mouth daily x 3 days, then 2 tabs by mouth daily x 2 days then 1 tab by mouth daily x 2 days   rosuvastatin (CRESTOR) 10 MG tablet Take 2 tablets (20 mg total) by mouth daily.   No current facility-administered medications on file prior to visit.     Allergies:   Lactose intolerance (gi), Nickel, and Penicillins   Social History  Tobacco Use   Smoking status: Never   Smokeless tobacco: Never  Vaping Use   Vaping Use: Never used  Substance Use Topics   Alcohol use: Yes    Alcohol/week: 0.0 standard drinks    Comment: occ   Drug use: No    Family History: family history includes Allergic rhinitis in her mother; Breast cancer in her sister; Diabetes in her mother; Hypertension in her sister; Sinusitis in her mother. There is no history of Colon cancer, Heart attack, or Stroke. She was adopted.  ROS:   Please see the history of present illness.  All other systems are reviewed and negative.    EKGs/Labs/Other Studies Reviewed:    The following studies were reviewed today:  [place] LE Venous 01/15/2020: Summary:  RIGHT:  - No evidence of common femoral vein obstruction.     LEFT:  - There is no evidence of deep vein thrombosis in the lower extremity.     - No cystic structure found in the popliteal fossa.   CUS Exercise Stress Echo (CE) 06/25/2016: SUMMARY  The baseline ECG displays normal sinus rhythm.  The baseline ECG displays abnormal T waves in the lateral leads.  Normal left ventricular function at rest.  There were no segmental wall motion abnormalities at rest  The estimated LV ejection fraction is 55-60% .  No diagnostic ST segment  changes were seen.  The patient achieved 96% % of maximum predicted heart rate.  The METS achieved was 7.  Exercise capacity was fair.  Normal left ventricular function and global wall motion with stress.  There were no segmental wall motion abnormalities post exercise  Negative stress ECG for inducible ischemia at target heart rate.  Negative exercise echocardiography for inducible ischemia at target heart  rate.  ECG changes, blood pressure and heart rate responses during stress test are  shown in the Cardiology Scan portion of this report in the EPIC   [facility] 08/05/2015 Ost 2nd Diag to 2nd Diag lesion, 80% stenosed. 2 small for PCI Prox LAD to Mid LAD lesion, 60% stenosed. Prox Cx to Mid Cx lesion, 50% stenosed. The lesion was previously treated with angioplasty one to two years ago.    Essentially stable coronary artery disease as compared to last catheterization. The PTCA site does have some restenosis but only 50% and a very focal segment.   Potential culprit for angiogram would be the diagonal lesion that is not amenable to PCI. If the patient were to have worsening symptoms, would recommend noninvasive stress testing to evaluate the LAD which appears to be stable from 2015. FFR and LV gram not performed due to concern for radial/brachial spasm.   Plan: 3 Standard post radial cath care with TR band removal  Discharge after bedrest and TR band removal Increase Imdur dose to 120 mg Consider adding diuretic    She will f/u with me or APP post cath for medication titration   EKG:  EKG is personally reviewed.   05/21/2021: *** 02/18/2021: Sinus rhythm, nonspecific t wave pattern. Rate 78 bpm. 02/12/2020: sinus rhythm at 82 bpm with nonspecific t wave pattern  Recent Labs: 06/04/2020: BUN 10; Creatinine, Ser 0.69; Hemoglobin 12.7; Platelets 255; Potassium 3.8; Sodium 140   Recent Lipid Panel    Component Value Date/Time   CHOL 206 (H) 05/09/2020 0854   TRIG 143.0 05/09/2020 0854    HDL 44.90 05/09/2020 0854   CHOLHDL 5 05/09/2020 0854   VLDL 28.6 05/09/2020 0854   LDLCALC 133 (H) 05/09/2020  0854   LDLCALC 151 (H) 05/08/2019 0824   LDLDIRECT 192.2 02/11/2009 1439    Physical Exam:    VS:  There were no vitals taken for this visit.    Wt Readings from Last 3 Encounters:  04/08/21 288 lb 9.6 oz (130.9 kg)  02/18/21 280 lb 6.4 oz (127.2 kg)  01/01/21 286 lb 12.8 oz (130.1 kg)    GEN: Well nourished, well developed in no acute distress HEENT: Normal, moist mucous membranes NECK: No JVD CARDIAC: regular rhythm, normal S1 and S2, no rubs or gallops. No murmur. VASCULAR: Radial and DP pulses 2+ bilaterally. No carotid bruits RESPIRATORY:  Clear to auscultation without rales, wheezing or rhonchi  ABDOMEN: Soft, non-tender, non-distended MUSCULOSKELETAL:  Ambulates independently SKIN: Warm and dry, no edema NEUROLOGIC:  Alert and oriented x 3. No focal neuro deficits noted. PSYCHIATRIC:  Normal affect    ASSESSMENT:    No diagnosis found.   PLAN:    CAD with prior PTCA Costochondritis -has not had angina, has had tender costochondritis. Reviewed red flag signs that need immediate medical attention -continue aspirin, beta blocker, nitrate -avoid NSAIDs -see below re: lipids  Intermittent shortness of breath: not typically exertional, improves with her inhaler and/or deep breathing -working on BP management, then would encourage exercise to see if this improves her symptoms -exam unremarkable today  Hypertension: elevated today -add amlodipine 5 mg daily -continue carvedilol, HCTZ, isosorbide, losartan  Hyperlipidemia: goal LDL <70, last LDL 133 per [facility] -continue ezetimibe 10 mg daily -stopped atorvastatin due to muscle cramps. Will trial rosuvastatin, slow ramp to goal -recheck  once 2 mos at goal dose, prior to next visit  Type II diabetes: -with ASCVD, would recommend either SGLT2i or GLP1RA -she is interested in weight loss, does not have a  history of heart failure. We discussed GLP1RA today. Re-discuss at follow up in 3 mos, as we are adding two other medications today -last A1c 6.8  OSA: recommended use of CPAP  Cardiac risk counseling and prevention recommendations: -recommend heart healthy/Mediterranean diet, with whole grains, fruits, vegetable, fish, lean meats, nuts, and olive oil. Limit salt. -recommend moderate walking, 3-5 times/week for 30-50 minutes each session. Aim for at least 150 minutes.week. Goal should be pace of 3 miles/hours, or walking 1.5 miles in 30 minutes -recommend avoidance of tobacco products. Avoid excess alcohol. -  Plan for follow up: ***3 months or sooner as needed.  Buford Dresser, MD, PhD, [facility]    Medication Adjustments/Labs and Tests Ordered: Current medicines are reviewed at length with the patient today.  Concerns regarding medicines are outlined above.   No orders of the defined types were placed in this encounter.  No orders of the defined types were placed in this encounter.  There are no Patient Instructions on file for this visit.   I,Mathew Stumpf,acting as a Education administrator for [employer], MD.,have documented all relevant documentation on the behalf of Buford Dresser, MD,as directed by  Buford Dresser, MD while in the presence of Buford Dresser, MD.  I, Madelin Rear, have reviewed all documentation for this visit. The documentation on 05/20/21 for the exam, diagnosis, procedures, and orders are all accurate and complete.   Signed, Buford Dresser, MD PhD 05/20/2021  Daniel

## 2021-05-21 ENCOUNTER — Ambulatory Visit (HOSPITAL_BASED_OUTPATIENT_CLINIC_OR_DEPARTMENT_OTHER): Payer: BC Managed Care – PPO | Admitting: Cardiology

## 2021-06-14 DIAGNOSIS — R509 Fever, unspecified: Secondary | ICD-10-CM | POA: Diagnosis not present

## 2021-06-14 DIAGNOSIS — Z20822 Contact with and (suspected) exposure to covid-19: Secondary | ICD-10-CM | POA: Diagnosis not present

## 2021-06-14 DIAGNOSIS — U071 COVID-19: Secondary | ICD-10-CM | POA: Diagnosis not present

## 2021-06-14 DIAGNOSIS — J069 Acute upper respiratory infection, unspecified: Secondary | ICD-10-CM | POA: Diagnosis not present

## 2021-07-04 ENCOUNTER — Encounter: Payer: Self-pay | Admitting: Family Medicine

## 2021-07-04 ENCOUNTER — Ambulatory Visit (INDEPENDENT_AMBULATORY_CARE_PROVIDER_SITE_OTHER)
Admission: RE | Admit: 2021-07-04 | Discharge: 2021-07-04 | Disposition: A | Discharge status: Home / Self Care | Payer: BC Managed Care – PPO | Source: Ambulatory Visit | Attending: Family Medicine | Admitting: Family Medicine

## 2021-07-04 ENCOUNTER — Ambulatory Visit: Payer: BC Managed Care – PPO | Admitting: Family Medicine

## 2021-07-04 ENCOUNTER — Other Ambulatory Visit: Payer: Self-pay

## 2021-07-04 VITALS — BP 120/82 | HR 98 | Temp 97.9°F | Ht 69.0 in | Wt 283.4 lb

## 2021-07-04 DIAGNOSIS — M79645 Pain in left finger(s): Secondary | ICD-10-CM

## 2021-07-04 DIAGNOSIS — M7989 Other specified soft tissue disorders: Secondary | ICD-10-CM | POA: Diagnosis not present

## 2021-07-04 DIAGNOSIS — J0100 Acute maxillary sinusitis, unspecified: Secondary | ICD-10-CM | POA: Diagnosis not present

## 2021-07-04 DIAGNOSIS — M722 Plantar fascial fibromatosis: Secondary | ICD-10-CM | POA: Diagnosis not present

## 2021-07-04 MED ORDER — AZITHROMYCIN 250 MG PO TABS: ORAL_TABLET | ORAL | 0 refills | Status: AC

## 2021-07-04 MED ORDER — CLOTRIMAZOLE 1 % EX CREA: 1.0000 "application " | TOPICAL_CREAM | Freq: Two times a day (BID) | CUTANEOUS | 0 refills | Status: DC

## 2021-07-04 NOTE — Assessment & Plan Note (Signed)
S/P viral infeciton now with new lefts sided face pain... willt reat with [employer] for presumed bacterial super infeciton.  Restart flonase 2 sprays per nostril daily and saline rinse of sinuses.

## 2021-07-04 NOTE — Assessment & Plan Note (Signed)
Acute, poor control   Start aleve BID x 2 weeks, start home PT and ice massage.  info provided.  Call if not improving as expected in 4-6 weeks.

## 2021-07-04 NOTE — Assessment & Plan Note (Signed)
No known injury or deformity.  Bothers pt a lot. Likely OA.. will eval with X-ray

## 2021-07-04 NOTE — Patient Instructions (Addendum)
Complete antibiotic course.  Restart the Flonase 2 sprays per nostril daily.  Call if pain not improving.  Start ice massage and home plantar fasciitis physical therapy.  Start aleve twice daily or pain and inflammation for no longer than 2 weeks. Call if heel pain not improving in 4-6 weeks to consider referral to treat.  We will call with X-ray results.

## 2021-07-04 NOTE — Progress Notes (Signed)
Patient ID: Christine Barrett, female    DOB: February 03, 1961, 61 y.o.   MRN: [id number]  This visit was conducted in person.  BP 120/82    Pulse 98    Temp 97.9 F (36.6 C) (Temporal)    Ht 5\' 9"  (1.753 m)    Wt 283 lb 6 oz (128.5 kg)    SpO2 98%    BMI 41.85 kg/m    CC: Chief Complaint  Patient presents with   Facial Pain        Dizziness    Subjective:   HPI: Christine Barrett is a 61 y.o. female presenting on 07/04/2021 for Facial Pain (/) and Dizziness    Date of onset: 12/17 Dx with COVID.[name] treated with paxlovid.  He symptoms improved significantly.   She has continued to have post nasal drip, mild cough.. in last week she has had increased sinus pressure above eyes, progressed to swelling and pressure/pain under left eye and across to ear.  No current fever now.  Minimal nasal discharge.   Left ear pain and pressure.  No sore throat.   No SOB, wheeze.   She has used sudafed daily.  Nasal rinse.  BP Readings from Last 3 Encounters:  07/04/21 120/82  04/08/21 128/76  02/18/21 (!) 144/82    Still having pain and swelling  every morning in left DIP middle finer   No known injury.  Steroid treated other joint issues but only helped this a lottle.   She has also been having 2-3 weeks of right heel pain.. pain with standing in AM, improves with walking  No known injury.     Relevant past medical, surgical, family and social history reviewed and updated as indicated. Interim medical history since our last visit reviewed. Allergies and medications reviewed and updated. Outpatient Medications Prior to Visit  Medication Sig Dispense Refill   albuterol (VENTOLIN HFA) 108 (90 Base) MCG/ACT inhaler INHALE 2 PUFFS BY MOUTH EVERY 6 HOURS AS NEEDED FOR WHEEZING OR  SHORTNESS  OF  BREATH 18 g 3   amLODipine (NORVASC) 5 MG tablet Take 1 tablet (5 mg total) by mouth daily. 180 tablet 3   aspirin EC 81 MG EC tablet Take 1 tablet (81 mg total) by mouth daily.     BLACK ELDERBERRY PO Take  2 each by mouth daily.     carvedilol (COREG) 25 MG tablet TAKE 1 TABLET BY MOUTH TWICE DAILY WITH A MEAL 180 tablet 3   clotrimazole (LOTRIMIN) 1 % cream Apply 1 application topically 2 (two) times daily. 30 g 0   ezetimibe (ZETIA) 10 MG tablet Take 1 tablet (10 mg total) by mouth daily. 90 tablet 3   famotidine (PEPCID) 20 MG tablet Take 20 mg by mouth daily.      fluticasone (FLONASE) 50 MCG/ACT nasal spray Place 2 sprays into both nostrils daily as needed for allergies or rhinitis.     hydrochlorothiazide (HYDRODIURIL) 25 MG tablet Take 1 tablet (25 mg total) by mouth daily. 90 tablet 3   isosorbide mononitrate (IMDUR) 60 MG 24 hr tablet Take 2 tablets (120 mg total) by mouth daily. 180 tablet 3   latanoprost (XALATAN) 0.005 % ophthalmic solution Place 1 drop into both eyes at bedtime.      levocetirizine (XYZAL) 5 MG tablet Take 5 mg by mouth daily.     losartan (COZAAR) 100 MG tablet Take 1 tablet (100 mg total) by mouth daily. 90 tablet 3   montelukast (SINGULAIR) 10 MG  tablet Take 1 tablet (10 mg total) by mouth at bedtime. 90 tablet 3   Multiple Vitamin (MULTIVITAMIN WITH MINERALS) TABS tablet Take 1 tablet by mouth daily.     Multiple Vitamins-Minerals (EMERGEN-C IMMUNE) PACK Take 1 packet by mouth daily.     pantoprazole (PROTONIX) 40 MG tablet Take 1 tablet (40 mg total) by mouth daily. 90 tablet 0   rosuvastatin (CRESTOR) 10 MG tablet Take 2 tablets (20 mg total) by mouth daily. 180 tablet 3   predniSONE (DELTASONE) 20 MG tablet 3 tabs by mouth daily x 3 days, then 2 tabs by mouth daily x 2 days then 1 tab by mouth daily x 2 days 15 tablet 0   No facility-administered medications prior to visit.     Per HPI unless specifically indicated in ROS section below Review of Systems  Constitutional:  Negative for fatigue and fever.  HENT:  Negative for congestion and ear pain.   Eyes:  Negative for pain.  Respiratory:  Negative for cough, chest tightness and shortness of breath.    Cardiovascular:  Negative for chest pain, palpitations and leg swelling.  Gastrointestinal:  Negative for abdominal pain.  Genitourinary:  Negative for dysuria and vaginal bleeding.  Musculoskeletal:  Negative for back pain.  Neurological:  Negative for syncope, light-headedness and headaches.  Psychiatric/Behavioral:  Negative for dysphoric mood.   Objective:  BP 120/82    Pulse 98    Temp 97.9 F (36.6 C) (Temporal)    Ht 5\' 9"  (1.753 m)    Wt 283 lb 6 oz (128.5 kg)    SpO2 98%    BMI 41.85 kg/m   Wt Readings from Last 3 Encounters:  07/04/21 283 lb 6 oz (128.5 kg)  04/08/21 288 lb 9.6 oz (130.9 kg)  02/18/21 280 lb 6.4 oz (127.2 kg)      Physical Exam Constitutional:      General: She is not in acute distress.    Appearance: Normal appearance. She is well-developed. She is not ill-appearing or toxic-appearing.  HENT:     Head: Normocephalic.     Right Ear: Hearing, ear canal and external ear normal. A middle ear effusion is present. Tympanic membrane is not erythematous, retracted or bulging.     Left Ear: Hearing, ear canal and external ear normal. A middle ear effusion is present. Tympanic membrane is not erythematous, retracted or bulging.     Nose: No mucosal edema or rhinorrhea.     Right Turbinates: Enlarged and swollen.     Left Turbinates: Enlarged and swollen.     Right Sinus: Frontal sinus tenderness present. No maxillary sinus tenderness.     Left Sinus: Maxillary sinus tenderness and frontal sinus tenderness present.     Mouth/Throat:     Pharynx: Uvula midline.  Eyes:     General: Lids are normal. Lids are everted, no foreign bodies appreciated.     Conjunctiva/sclera: Conjunctivae normal.     Pupils: Pupils are equal, round, and reactive to light.  Neck:     Thyroid: No thyroid mass or thyromegaly.     Vascular: No carotid bruit.     Trachea: Trachea normal.  Cardiovascular:     Rate and Rhythm: Normal rate and regular rhythm.     Pulses: Normal pulses.      Heart sounds: Normal heart sounds, S1 normal and S2 normal. No murmur heard.   No friction rub. No gallop.  Pulmonary:     Effort: Pulmonary effort is normal. No  tachypnea or respiratory distress.     Breath sounds: Normal breath sounds. No decreased breath sounds, wheezing, rhonchi or rales.  Abdominal:     General: Bowel sounds are normal.     Palpations: Abdomen is soft.     Tenderness: There is no abdominal tenderness.  Musculoskeletal:     Cervical back: Normal range of motion and neck supple.     Comments: Ttp over insertion of plantar fascia on right heel, neg heel squeeze, normal fat pad.  No achilles ttp  Left DIP middle finger with slight swelling, no redness, decreased ROm due to pain.  Skin:    General: Skin is warm and dry.     Findings: No rash.  Neurological:     Mental Status: She is alert.  Psychiatric:        Mood and Affect: Mood is not anxious or depressed.        Speech: Speech normal.        Behavior: Behavior normal. Behavior is cooperative.        Thought Content: Thought content normal.        Judgment: Judgment normal.      Results for orders placed or performed in visit on 11/15/20  HM DIABETES EYE EXAM  Result Value Ref Range   HM Diabetic Eye Exam No Retinopathy No Retinopathy    This visit occurred during the SARS-CoV-2 public health emergency.  Safety protocols were in place, including screening questions prior to the visit, additional usage of staff PPE, and extensive cleaning of exam room while observing appropriate contact time as indicated for disinfecting solutions.   COVID 19 screen:  No recent travel or known exposure to COVID19 The patient denies respiratory symptoms of COVID 19 at this time. The importance of social distancing was discussed today.   Assessment and Plan    Problem List Items Addressed This Visit     Acute non-recurrent maxillary sinusitis - Primary    S/P viral infeciton now with new lefts sided face pain... willt  reat with Zpack for presumed bacterial super infeciton.  Restart flonase 2 sprays per nostril daily and saline rinse of sinuses.      Relevant Medications   azithromycin (ZITHROMAX) 250 MG tablet   Pain of left middle finger    No known injury or deformity.  Bothers pt a lot. Likely OA.. will eval with X-ray       Relevant Orders   DG Finger Middle Left   Plantar fasciitis, right     Acute, poor control   Start aleve BID x 2 weeks, start home PT and ice massage.  info provided.  Call if not improving as expected in 4-6 weeks.      Meds ordered this encounter  Medications   clotrimazole (LOTRIMIN) 1 % cream    Sig: Apply 1 application topically 2 (two) times daily.    Dispense:  30 g    Refill:  0   azithromycin (ZITHROMAX) 250 MG tablet    Sig: Take 2 tablets on day 1, then 1 tablet daily on days 2 through 5    Dispense:  6 tablet    Refill:  0   Orders Placed This Encounter  Procedures   DG Finger Middle Left    Standing Status:   Future    Standing Expiration Date:   07/04/2022    Order Specific Question:   Reason for Exam (SYMPTOM  OR DIAGNOSIS REQUIRED)    Answer:   pain and  swelling in distal left middle finger IP joint.    Order Specific Question:   Is patient pregnant?    Answer:   No    Order Specific Question:   Preferred imaging location?    Answer:   Barbie Haggis, MD

## 2021-07-09 ENCOUNTER — Other Ambulatory Visit: Payer: Self-pay | Admitting: Family Medicine

## 2021-07-09 DIAGNOSIS — R1310 Dysphagia, unspecified: Secondary | ICD-10-CM

## 2021-07-09 NOTE — Telephone Encounter (Signed)
Please try and schedule CPE with fasting labs prior for Dr. [name].  Okay to wait until we are back at [facility].

## 2021-07-10 ENCOUNTER — Telehealth: Payer: Self-pay | Admitting: Family Medicine

## 2021-07-10 NOTE — Telephone Encounter (Signed)
Called pt and lvmtcb to schedule CPE

## 2021-07-10 NOTE — Telephone Encounter (Signed)
Ms. Christine Barrett called in and stated that she is not feeling good. She is still having pressure in her ears and she is having more pressure in face and bouts of dizziness.

## 2021-07-11 ENCOUNTER — Encounter: Payer: Self-pay | Admitting: Family Medicine

## 2021-07-11 ENCOUNTER — Other Ambulatory Visit: Payer: Self-pay | Admitting: Family Medicine

## 2021-07-11 MED ORDER — PREDNISONE 20 MG PO TABS: ORAL_TABLET | ORAL | 0 refills | Status: DC

## 2021-07-11 NOTE — Telephone Encounter (Signed)
Left message for Ms. Dancy to return my call.

## 2021-07-11 NOTE — Telephone Encounter (Signed)
Pt returned call

## 2021-07-11 NOTE — Progress Notes (Signed)
Prescription sent

## 2021-07-11 NOTE — Telephone Encounter (Signed)
Ms. Amado Nash notified as instructed by telephone.  She has finished the antibiotics.  She would like to do the course of prednisone. [facility] on [address].

## 2021-07-11 NOTE — Telephone Encounter (Signed)
Call   Complete antibiotic.  If she is interested we can treat symptoms with an oral course of prednisone to decrease facial pressure and help the ears drain. This should help the dizziness.

## 2021-07-14 ENCOUNTER — Encounter: Payer: Self-pay | Admitting: Family Medicine

## 2021-07-14 ENCOUNTER — Ambulatory Visit: Payer: BC Managed Care – PPO | Admitting: Family Medicine

## 2021-07-14 VITALS — BP 121/64 | Ht 69.5 in | Wt 280.0 lb

## 2021-07-14 DIAGNOSIS — M722 Plantar fascial fibromatosis: Secondary | ICD-10-CM

## 2021-07-14 NOTE — Patient Instructions (Signed)
Thank you for coming to see me today. It was a pleasure.   You have plantar fasciitis Take tylenol as needed for pain. Plantar fascia stretch for 20-30 seconds (do 3 of these) in morning Lowering/raise on a step exercises 3 x 10 once or twice a day - this is very important for long term recovery. Ice heel for 15 minutes as needed. Avoid flat shoes/barefoot walking as much as possible. Arch binders have been shown to help with pain. Inserts are important (spencos, superfeet, our green insoles, custom orthotics). [place] sock when sleeping. Steroid injection is a consideration for short term pain relief if you are struggling. Physical therapy is also an option. Follow up with me in 6 weeks.  If you have any questions or concerns, please do not hesitate to call the office at [phone].  Best,   Carollee Leitz, MD and Karlton Lemon, MD

## 2021-07-14 NOTE — Assessment & Plan Note (Addendum)
Pain consistent with plantar fasciitis and history of Pes Planus contributing to pain. -Plantar fasciitis exercises -Arche compression -Orthotic inserts with scaphoid pads -Avoid bare feet -Good supportive footwear -Follow up with Dr Barbaraann Barthel in 6 weeks

## 2021-07-14 NOTE — Progress Notes (Signed)
° ° °  SUBJECTIVE:   CHIEF COMPLAINT / HPI: right heel pain  Ongoing for  few months. Worse upon awakening or after resting.  Unable to bear weight for few mins when standing.  Pain is sharp and intermittent.  No increase in exercise.  Has not tried any OTC medications for relief.  Denies any fevers, numbness, weakness, trauma or recent injury.    PERTINENT  PMH / PSH: Pes Planus  OBJECTIVE:   BP 121/64    Ht 5' 9.5" (1.765 m)    Wt 280 lb (127 kg)    BMI 40.76 kg/m    General: Alert, no acute distress Right foot: Inspection: No erythema, edema or ecchymosis.  Pes planus Palpation: Pain at site of insertion of plantar fascia to calcaneus FROM Sensation intact, pulses present and well perfused Anterior drawer negative Talar tilt negative Compression test negative Thompson test negative  ASSESSMENT/PLAN:   Plantar fasciitis of right foot Pain consistent with plantar fasciitis and history of Pes Planus contributing to pain. -Plantar fasciitis exercises -Arch compression -Orthotic inserts with scaphoid pads -Avoid bare feet -Good supportive footwear -Follow up with Dr Barbaraann Barthel in 6 weeks   Carollee Leitz, MD [place]

## 2021-08-15 ENCOUNTER — Ambulatory Visit: Payer: BC Managed Care – PPO | Admitting: Family Medicine

## 2021-08-15 ENCOUNTER — Encounter: Payer: Self-pay | Admitting: Family Medicine

## 2021-08-15 ENCOUNTER — Other Ambulatory Visit: Payer: Self-pay

## 2021-08-15 VITALS — BP 154/84 | HR 77 | Temp 97.5°F | Ht 69.0 in | Wt 276.3 lb

## 2021-08-15 DIAGNOSIS — E1159 Type 2 diabetes mellitus with other circulatory complications: Secondary | ICD-10-CM

## 2021-08-15 DIAGNOSIS — R631 Polydipsia: Secondary | ICD-10-CM | POA: Diagnosis not present

## 2021-08-15 DIAGNOSIS — L989 Disorder of the skin and subcutaneous tissue, unspecified: Secondary | ICD-10-CM | POA: Diagnosis not present

## 2021-08-15 DIAGNOSIS — R300 Dysuria: Secondary | ICD-10-CM | POA: Diagnosis not present

## 2021-08-15 LAB — POCT UA - MICROSCOPIC ONLY

## 2021-08-15 LAB — POC URINALSYSI DIPSTICK (AUTOMATED)
Bilirubin, UA: NEGATIVE
Glucose, UA: POSITIVE — AB
Nitrite, UA: NEGATIVE
Protein, UA: POSITIVE — AB
Spec Grav, UA: 1.02 (ref 1.010–1.025)
Urobilinogen, UA: 0.2 E.U./dL
pH, UA: 5.5 (ref 5.0–8.0)

## 2021-08-15 LAB — POCT GLYCOSYLATED HEMOGLOBIN (HGB A1C): Hemoglobin A1C: 11.4 % — AB (ref 4.0–5.6)

## 2021-08-15 MED ORDER — FLUCONAZOLE 150 MG PO TABS: 150.0000 mg | ORAL_TABLET | Freq: Once | ORAL | 0 refills | Status: AC

## 2021-08-15 MED ORDER — METFORMIN HCL ER 500 MG PO TB24: 500.0000 mg | ORAL_TABLET | Freq: Every day | ORAL | 11 refills | Status: DC

## 2021-08-15 MED ORDER — NYSTATIN 100000 UNIT/GM EX CREA: 1.0000 "application " | TOPICAL_CREAM | Freq: Two times a day (BID) | CUTANEOUS | 0 refills | Status: DC

## 2021-08-15 MED ORDER — FAMOTIDINE 20 MG PO TABS: 20.0000 mg | ORAL_TABLET | Freq: Every day | ORAL | 3 refills | Status: DC

## 2021-08-15 NOTE — Addendum Note (Signed)
Addended by: Ellamae Sia on: 08/15/2021 10:04 AM   Modules accepted: Orders

## 2021-08-15 NOTE — Assessment & Plan Note (Signed)
Doubt UTI.. urine contaminated.

## 2021-08-15 NOTE — Assessment & Plan Note (Signed)
Likely due to yeast given appearance.. send for  Testing.  Start topical treatment as barrier and complete fluconazole x 2 doses.  Improve diabetes control to make vaginal environment less hospitable for yeast.

## 2021-08-15 NOTE — Patient Instructions (Addendum)
Take fluconazole x 1, repeat in 2 days if not resolved.  Apply topical nystatin externally to affected area.  No sign of urinary infection.  Start low dose metformin.  Work on low carb diet,decrease sweets, soda, bread etc. AND increase activity .  Keep follow up as scheduled.

## 2021-08-15 NOTE — Progress Notes (Signed)
Patient ID: Christine Barrett, female    DOB: [birth year], 61 y.o.   MRN: [id number]  This visit was conducted in person.  BP (!) 154/84 (BP Location: Right Arm, Cuff Size: Large)    Pulse 77    Temp (!) 97.5 F (36.4 C) (Temporal)    Ht 5\' 9"  (1.753 m)    Wt 276 lb 5 oz (125.3 kg)    SpO2 96%    BMI 40.80 kg/m    CC:  Chief Complaint  Patient presents with   Vaginal Pain    C/o vaginal pain, itching and burning.  Also, c/o pain with urination. Sxs started about 2 wks ago.     Subjective:   HPI: Christine Barrett is a 61 y.o. female presenting on 08/15/2021 for Vaginal Pain (C/o vaginal pain, itching and burning.  Also, c/o pain with urination. Sxs started about 2 wks ago. )  She reports new onset vaginal irritation in the last 2 weeks.  Has noted bump breaking out around rectum. Vaginal pain itching and burning when urine hit vaginal opening area.  Pain to wipe vaginal area  No abdominal pain  Discharge.. none   Recent antibiotics   No sexual activity in 2 years.  No known past exposure.    She has been very thirsty all the time x 1 month... she is urinating a lot due in part to the fact she is drinking a lot of  water.     Occ mild incontinence when she is going to the bathroom.  She has CPX set up next month.  Last A1C 6.8 04/2020     Relevant past medical, surgical, family and social history reviewed and updated as indicated. Interim medical history since our last visit reviewed. Allergies and medications reviewed and updated. Outpatient Medications Prior to Visit  Medication Sig Dispense Refill   albuterol (VENTOLIN HFA) 108 (90 Base) MCG/ACT inhaler INHALE 2 PUFFS BY MOUTH EVERY 6 HOURS AS NEEDED FOR WHEEZING OR  SHORTNESS  OF  BREATH 18 g 3   amLODipine (NORVASC) 5 MG tablet Take 1 tablet (5 mg total) by mouth daily. 180 tablet 3   aspirin EC 81 MG EC tablet Take 1 tablet (81 mg total) by mouth daily.     BLACK ELDERBERRY PO Take 2 each by mouth daily.     carvedilol  (COREG) 25 MG tablet TAKE 1 TABLET BY MOUTH TWICE DAILY WITH A MEAL 180 tablet 3   clotrimazole (LOTRIMIN) 1 % cream Apply 1 application topically 2 (two) times daily. 30 g 0   ezetimibe (ZETIA) 10 MG tablet Take 1 tablet (10 mg total) by mouth daily. 90 tablet 3   famotidine (PEPCID) 20 MG tablet Take 20 mg by mouth daily.      fluticasone (FLONASE) 50 MCG/ACT nasal spray Place 2 sprays into both nostrils daily as needed for allergies or rhinitis.     hydrochlorothiazide (HYDRODIURIL) 25 MG tablet Take 1 tablet (25 mg total) by mouth daily. 90 tablet 3   isosorbide mononitrate (IMDUR) 60 MG 24 hr tablet Take 2 tablets (120 mg total) by mouth daily. 180 tablet 3   latanoprost (XALATAN) 0.005 % ophthalmic solution Place 1 drop into both eyes at bedtime.      levocetirizine (XYZAL) 5 MG tablet Take 5 mg by mouth daily.     losartan (COZAAR) 100 MG tablet Take 1 tablet (100 mg total) by mouth daily. 90 tablet 3   montelukast (SINGULAIR) 10 MG tablet Take  1 tablet (10 mg total) by mouth at bedtime. 90 tablet 3   Multiple Vitamin (MULTIVITAMIN WITH MINERALS) TABS tablet Take 1 tablet by mouth daily.     Multiple Vitamins-Minerals (EMERGEN-C IMMUNE) PACK Take 1 packet by mouth daily.     pantoprazole (PROTONIX) 40 MG tablet TAKE 1 TABLET DAILY 90 tablet 0   predniSONE (DELTASONE) 20 MG tablet 3 tabs by mouth daily x 3 days, then 2 tabs by mouth daily x 2 days then 1 tab by mouth daily x 2 days 15 tablet 0   rosuvastatin (CRESTOR) 10 MG tablet Take 2 tablets (20 mg total) by mouth daily. 180 tablet 3   No facility-administered medications prior to visit.     Per HPI unless specifically indicated in ROS section below Review of Systems  Constitutional:  Negative for fatigue and fever.  HENT:  Negative for ear pain.   Eyes:  Negative for pain.  Respiratory:  Negative for chest tightness and shortness of breath.   Cardiovascular:  Negative for chest pain, palpitations and leg swelling.   Gastrointestinal:  Negative for abdominal pain, constipation, diarrhea and nausea.  Endocrine: Positive for polydipsia and polyuria.  Genitourinary:  Positive for frequency, urgency and vaginal pain. Negative for dysuria, hematuria, vaginal bleeding and vaginal discharge.  Objective:  BP (!) 154/84 (BP Location: Right Arm, Cuff Size: Large)    Pulse 77    Temp (!) 97.5 F (36.4 C) (Temporal)    Ht 5\' 9"  (1.753 m)    Wt 276 lb 5 oz (125.3 kg)    SpO2 96%    BMI 40.80 kg/m   Wt Readings from Last 3 Encounters:  08/15/21 276 lb 5 oz (125.3 kg)  07/14/21 280 lb (127 kg)  07/04/21 283 lb 6 oz (128.5 kg)      Physical Exam Exam conducted with a chaperone present.  Constitutional:      General: She is not in acute distress.    Appearance: Normal appearance. She is well-developed. She is not ill-appearing or toxic-appearing.  HENT:     Head: Normocephalic.     Right Ear: Hearing, tympanic membrane, ear canal and external ear normal. Tympanic membrane is not erythematous, retracted or bulging.     Left Ear: Hearing, tympanic membrane, ear canal and external ear normal. Tympanic membrane is not erythematous, retracted or bulging.     Nose: No mucosal edema or rhinorrhea.     Right Sinus: No maxillary sinus tenderness or frontal sinus tenderness.     Left Sinus: No maxillary sinus tenderness or frontal sinus tenderness.     Mouth/Throat:     Pharynx: Uvula midline.  Eyes:     General: Lids are normal. Lids are everted, no foreign bodies appreciated.     Conjunctiva/sclera: Conjunctivae normal.     Pupils: Pupils are equal, round, and reactive to light.  Neck:     Thyroid: No thyroid mass or thyromegaly.     Vascular: No carotid bruit.     Trachea: Trachea normal.  Cardiovascular:     Rate and Rhythm: Normal rate and regular rhythm.     Pulses: Normal pulses.     Heart sounds: Normal heart sounds, S1 normal and S2 normal. No murmur heard.   No friction rub. No gallop.  Pulmonary:      Effort: Pulmonary effort is normal. No tachypnea or respiratory distress.     Breath sounds: Normal breath sounds. No decreased breath sounds, wheezing, rhonchi or rales.  Abdominal:  General: Bowel sounds are normal.     Palpations: Abdomen is soft.     Tenderness: There is no abdominal tenderness.  Genitourinary:    Exam position: Lithotomy position.  Musculoskeletal:     Cervical back: Normal range of motion and neck supple.  Skin:    General: Skin is warm and dry.     Findings: No rash.  Neurological:     Mental Status: She is alert.  Psychiatric:        Mood and Affect: Mood is not anxious or depressed.        Speech: Speech normal.        Behavior: Behavior normal. Behavior is cooperative.        Thought Content: Thought content normal.        Judgment: Judgment normal.      Results for orders placed or performed in visit on 11/15/20  HM DIABETES EYE EXAM  Result Value Ref Range   HM Diabetic Eye Exam No Retinopathy No Retinopathy    This visit occurred during the SARS-CoV-2 public health emergency.  Safety protocols were in place, including screening questions prior to the visit, additional usage of staff PPE, and extensive cleaning of exam room while observing appropriate contact time as indicated for disinfecting solutions.   COVID 19 screen:  No recent travel or known exposure to COVID19 The patient denies respiratory symptoms of COVID 19 at this time. The importance of social distancing was discussed today.   Assessment and Plan    Problem List Items Addressed This Visit     Dysuria - Primary    Doubt UTI.. urine contaminated.      Relevant Orders   POCT Urinalysis Dipstick (Automated) (Completed)   POCT UA - Microscopic Only (Completed)   Increased thirst   Perineal irritation in female    Likely due to yeast given appearance.. send for  Testing.  Start topical treatment as barrier and complete fluconazole x 2 doses.  Improve diabetes control to make  vaginal environment less hospitable for yeast.      Type 2 diabetes mellitus with other circulatory complications, HTN, CVD (HCC)    Chronic , worsened control  A1C now at 11.3.  Will start mediation to treat.. follow up as planned in 1 month.      Relevant Medications   metFORMIN (GLUCOPHAGE-XR) 500 MG 24 hr tablet   Other Relevant Orders   POCT glycosylated hemoglobin (Hb A1C) (Completed)   Meds ordered this encounter  Medications   fluconazole (DIFLUCAN) 150 MG tablet    Sig: Take 1 tablet (150 mg total) by mouth once for 1 dose. Repeat dose in 2 days if not resolved after first dose    Dispense:  2 tablet    Refill:  0   nystatin cream (MYCOSTATIN)    Sig: Apply 1 application topically 2 (two) times daily. External application    Dispense:  30 g    Refill:  0   metFORMIN (GLUCOPHAGE-XR) 500 MG 24 hr tablet    Sig: Take 1 tablet (500 mg total) by mouth daily with breakfast.    Dispense:  30 tablet    Refill:  11   famotidine (PEPCID) 20 MG tablet    Sig: Take 1 tablet (20 mg total) by mouth daily.    Dispense:  90 tablet    Refill:  3   Orders Placed This Encounter  Procedures   POCT Urinalysis Dipstick (Automated)   POCT UA - Microscopic Only  POCT glycosylated hemoglobin (Hb A1C)       Eliezer Lofts, MD

## 2021-08-15 NOTE — Assessment & Plan Note (Addendum)
Chronic , worsened control  A1C now at 11.3.  Will start mediation to treat.. follow up as planned in 1 month.

## 2021-08-16 LAB — WET PREP BY MOLECULAR PROBE
Candida species: DETECTED — AB
Gardnerella vaginalis: NOT DETECTED
MICRO NUMBER:: 13024358
SPECIMEN QUALITY:: ADEQUATE
Trichomonas vaginosis: NOT DETECTED

## 2021-08-27 ENCOUNTER — Ambulatory Visit: Payer: BC Managed Care – PPO | Admitting: Family Medicine

## 2021-08-27 ENCOUNTER — Ambulatory Visit (INDEPENDENT_AMBULATORY_CARE_PROVIDER_SITE_OTHER): Payer: BC Managed Care – PPO | Admitting: Family Medicine

## 2021-08-27 VITALS — BP 130/82 | Ht 69.0 in | Wt 265.0 lb

## 2021-08-27 DIAGNOSIS — M722 Plantar fascial fibromatosis: Secondary | ICD-10-CM

## 2021-08-27 NOTE — Assessment & Plan Note (Signed)
Discussed further treatment options including trial of shockwave therapy after discussing the risks and benefits of this.  She opts to proceed with this today.  This was performed per below and she tolerated it well and actually had improvement upon standing and walking following her shockwave therapy.  She will consider coming back to repeat this, recommended weekly treatments for 4 to 6 weeks pending her improvement.  Recommended continuing this with her home exercises and trying to work very hard on being very diligent with her home exercises.  Recommend continuation of arch support and Strassburg sock.  Specifically discussed stretching her plantar fascia before getting out of bed in the morning.  Discussed that if she does not want to do shockwave therapy she should still follow-up in 4 to 6 weeks. ? ?Procedure: ECSWT ?Indications:   Right chronic Planter fasciitis ?  ?Procedure Details ?Consent: Risks of procedure as well as the alternatives and risks of each were explained to the patient.  Written consent for procedure obtained. ?Time Out: Verified patient identification, verified procedure, site was marked, verified correct patient position, medications/allergies/relevent history reviewed.  The area was cleaned with alcohol swab.   ?  ?The  right plantar fascia was targeted for Extracorporeal shockwave therapy.  ?  ?Preset:  Planter fasciitis ?Power Level:  100 ?Frequency: 10 ?Impulse/cycles: 1000 ?Head size:  Large ?  ?Patient tolerated procedure well without immediate complications ?  ? ?

## 2021-08-27 NOTE — Progress Notes (Signed)
? ?  Christine Barrett is a 61 y.o. female who presents to [facility] today for the following: ? ?Right foot Planter fasciitis ?Last seen for same on 1/16 ?Advised to use arch support, home exercises, Strassburg sock, arch binder ?Reports that she has been doing all of these things, but hasn't noticed much improvement ?Continues to have pain with first step out of bed in the AM, but hasn't been doing stretches before getting out of bed ? ?PMH reviewed.  ?ROS as above. ?Medications reviewed. ? ?Exam:  ?BP 130/82   Ht 5\' 9"  (1.753 m)   Wt 265 lb (120.2 kg)   BMI 39.13 kg/m?  ?Gen: Well NAD ?MSK: ? ?Right Foot: ?Inspection:  No obvious bony deformity b/l.  No swelling, erythema, or bruising b/l.  Pes planus b/l ?Palpation: TTP at origin of plantar fascia at medial calcaneus, no tenderness palpation over the Achilles tendon specifically ?ROM: Full  ROM of the ankle b/l ?Strength: 5/5 strength ankle in all planes b/l ?Neurovascular: N/V intact distally in the lower extremity b/l ?Special tests: Pain with stretching of plantar fascia, negative calcaneal squeeze ? ?Limited ultrasound of the right foot measures the plantar fascia at 0.6 cm and there are few calcifications near the origin of the plantar fascia at the calcaneus.  Comparison view on the left shows plantar fascia measuring 0.4 cm without any significant changes noted throughout the plantar fascia. ?Impression: Consistent with chronic Planter fasciitis on the right ? ?No results found. ? ? ?Assessment and Plan: ?1) Plantar fasciitis of right foot ?Discussed further treatment options including trial of shockwave therapy after discussing the risks and benefits of this.  She opts to proceed with this today.  This was performed per below and she tolerated it well and actually had improvement upon standing and walking following her shockwave therapy.  She will consider coming back to repeat this, recommended weekly treatments for 4 to 6 weeks pending her improvement.   Recommended continuing this with her home exercises and trying to work very hard on being very diligent with her home exercises.  Recommend continuation of arch support and Strassburg sock.  Specifically discussed stretching her plantar fascia before getting out of bed in the morning.  Discussed that if she does not want to do shockwave therapy she should still follow-up in 4 to 6 weeks. ? ?Procedure: ECSWT ?Indications:   Right chronic Planter fasciitis ?  ?Procedure Details ?Consent: Risks of procedure as well as the alternatives and risks of each were explained to the patient.  Written consent for procedure obtained. ?Time Out: Verified patient identification, verified procedure, site was marked, verified correct patient position, medications/allergies/relevent history reviewed.  The area was cleaned with alcohol swab.   ?  ?The  right plantar fascia was targeted for Extracorporeal shockwave therapy.  ?  ?Preset:  Planter fasciitis ?Power Level:  100 ?Frequency: 10 ?Impulse/cycles: 1000 ?Head size:  Large ?  ?Patient tolerated procedure well without immediate complications ?  ? ? ?[place] Constable, D.O.  ?PGY-4 [facility]  ?08/27/2021 4:54 PM ? ?I was the preceptor for this visit and available for immediate consultation ?Shellia Cleverly, DO ?

## 2021-08-29 ENCOUNTER — Other Ambulatory Visit: Payer: Self-pay | Admitting: Family Medicine

## 2021-08-29 ENCOUNTER — Other Ambulatory Visit: Payer: Self-pay

## 2021-08-29 ENCOUNTER — Encounter: Payer: Self-pay | Admitting: Family Medicine

## 2021-08-29 ENCOUNTER — Emergency Department (HOSPITAL_BASED_OUTPATIENT_CLINIC_OR_DEPARTMENT_OTHER)
Admission: EM | Admit: 2021-08-29 | Discharge: 2021-08-29 | Disposition: A | Discharge status: Home / Self Care | Payer: BC Managed Care – PPO | Attending: Emergency Medicine | Admitting: Emergency Medicine

## 2021-08-29 DIAGNOSIS — E1159 Type 2 diabetes mellitus with other circulatory complications: Secondary | ICD-10-CM

## 2021-08-29 DIAGNOSIS — Z79899 Other long term (current) drug therapy: Secondary | ICD-10-CM | POA: Insufficient documentation

## 2021-08-29 DIAGNOSIS — M79605 Pain in left leg: Secondary | ICD-10-CM | POA: Diagnosis not present

## 2021-08-29 DIAGNOSIS — M79604 Pain in right leg: Secondary | ICD-10-CM | POA: Diagnosis not present

## 2021-08-29 DIAGNOSIS — R739 Hyperglycemia, unspecified: Secondary | ICD-10-CM

## 2021-08-29 DIAGNOSIS — Z7984 Long term (current) use of oral hypoglycemic drugs: Secondary | ICD-10-CM | POA: Insufficient documentation

## 2021-08-29 DIAGNOSIS — E119 Type 2 diabetes mellitus without complications: Secondary | ICD-10-CM | POA: Insufficient documentation

## 2021-08-29 DIAGNOSIS — Z7982 Long term (current) use of aspirin: Secondary | ICD-10-CM | POA: Diagnosis not present

## 2021-08-29 DIAGNOSIS — E139 Other specified diabetes mellitus without complications: Secondary | ICD-10-CM

## 2021-08-29 DIAGNOSIS — E1165 Type 2 diabetes mellitus with hyperglycemia: Secondary | ICD-10-CM | POA: Diagnosis not present

## 2021-08-29 DIAGNOSIS — R350 Frequency of micturition: Secondary | ICD-10-CM | POA: Diagnosis not present

## 2021-08-29 LAB — URINALYSIS, ROUTINE W REFLEX MICROSCOPIC
Bilirubin Urine: NEGATIVE
Glucose, UA: >1000 mg/dL — AB
Ketones, ur: 15 mg/dL — AB
Leukocytes,Ua: NEGATIVE
Nitrite: NEGATIVE
Protein, ur: NEGATIVE mg/dL
Specific Gravity, Urine: 1.032 — ABNORMAL HIGH (ref 1.005–1.030)
pH: 5 (ref 5.0–8.0)

## 2021-08-29 LAB — CBC
HCT: 38.4 % (ref 36.0–46.0)
Hemoglobin: 12.3 g/dL (ref 12.0–15.0)
MCH: 26.2 pg (ref 26.0–34.0)
MCHC: 32 g/dL (ref 30.0–36.0)
MCV: 81.7 fL (ref 80.0–100.0)
Platelets: 173 10*3/uL (ref 150–400)
RBC: 4.7 MIL/uL (ref 3.87–5.11)
RDW: 13.1 % (ref 11.5–15.5)
WBC: 7.5 10*3/uL (ref 4.0–10.5)
nRBC: 0 % (ref 0.0–0.2)

## 2021-08-29 LAB — BASIC METABOLIC PANEL
Anion gap: 12 (ref 5–15)
BUN: 15 mg/dL (ref 6–20)
CO2: 24 mmol/L (ref 22–32)
Calcium: 10.3 mg/dL (ref 8.9–10.3)
Chloride: 97 mmol/L — ABNORMAL LOW (ref 98–111)
Creatinine, Ser: 0.91 mg/dL (ref 0.44–1.00)
GFR, Estimated: >60 mL/min (ref >60)
Glucose, Bld: 472 mg/dL — ABNORMAL HIGH (ref 70–99)
Potassium: 3.8 mmol/L (ref 3.5–5.1)
Sodium: 133 mmol/L — ABNORMAL LOW (ref 135–145)

## 2021-08-29 LAB — CBG MONITORING, ED
Glucose-Capillary: 476 mg/dL — ABNORMAL HIGH (ref 70–99)
Glucose-Capillary: 366 mg/dL — ABNORMAL HIGH (ref 70–99)
Glucose-Capillary: 342 mg/dL — ABNORMAL HIGH (ref 70–99)

## 2021-08-29 LAB — PREGNANCY, URINE: Preg Test, Ur: NEGATIVE

## 2021-08-29 MED ORDER — ONETOUCH DELICA LANCETS 30G MISC: 5 refills | Status: DC

## 2021-08-29 MED ORDER — METFORMIN HCL ER 500 MG PO TB24: 1000.0000 mg | ORAL_TABLET | Freq: Two times a day (BID) | ORAL | 11 refills | Status: DC

## 2021-08-29 MED ORDER — INSULIN ASPART 100 UNIT/ML IJ SOLN
12.0000 [IU] | Freq: Once | INTRAMUSCULAR | Status: AC
  Administered 2021-08-29: 12 [IU] via SUBCUTANEOUS

## 2021-08-29 MED ORDER — ONETOUCH DELICA LANCING DEV MISC: 0 refills | Status: AC

## 2021-08-29 MED ORDER — ONETOUCH VERIO W/DEVICE KIT: PACK | 0 refills | Status: DC

## 2021-08-29 MED ORDER — ONETOUCH VERIO VI STRP: ORAL_STRIP | 5 refills | Status: DC

## 2021-08-29 MED ORDER — SODIUM CHLORIDE 0.9 % IV BOLUS
1000.0000 mL | Freq: Once | INTRAVENOUS | Status: AC
  Administered 2021-08-29: 1000 mL via INTRAVENOUS

## 2021-08-29 MED ORDER — NYSTATIN 100000 UNIT/GM EX CREA: 1.0000 "application " | TOPICAL_CREAM | Freq: Two times a day (BID) | CUTANEOUS | 0 refills | Status: DC

## 2021-08-29 NOTE — ED Triage Notes (Signed)
Pt complains of bilateral leg cramping and weakness, excessive thirst and urination. Dx with diabetes 2 weeks ago and just started metformin.  ?

## 2021-08-29 NOTE — ED Provider Notes (Signed)
?[facility] ?Provider Note ? ? ?CSN: [id number] ?Arrival date & time: 08/29/21  1157 ? ?  ? ?History ? ?Chief Complaint  ?Patient presents with  ? Leg Pain  ? Urinary Frequency  ? ? ?Christine Barrett is a 61 y.o. female. ? ?Patient recently diagnosed with diabetes by the primary care doctor 2 weeks ago.  She was started on metformin but only 500 mg once a day.  She states that she still had increased thirst and urination.  Having cramps in bilateral legs.  No reports of fevers or cough.  No vomiting or diarrhea. ? ? ?  ? ?Home Medications ?Prior to Admission medications   ?Medication Sig Start Date End Date Taking? Authorizing Provider  ?albuterol (VENTOLIN HFA) 108 (90 Base) MCG/ACT inhaler INHALE 2 PUFFS BY MOUTH EVERY 6 HOURS AS NEEDED FOR WHEEZING OR  SHORTNESS  OF  BREATH 02/07/21   [name], [name], MD  ?amLODipine (NORVASC) 5 MG tablet Take 1 tablet (5 mg total) by mouth daily. 02/18/21   Buford Dresser, MD  ?aspirin EC 81 MG EC tablet Take 1 tablet (81 mg total) by mouth daily. 10/04/13   Geradine Girt, DO  ?BLACK ELDERBERRY PO Take 2 each by mouth daily.    [provider]  ?carvedilol (COREG) 25 MG tablet TAKE 1 TABLET BY MOUTH TWICE DAILY WITH A MEAL 02/07/21   [name], [name], MD  ?clotrimazole (LOTRIMIN) 1 % cream Apply 1 application topically 2 (two) times daily. 07/04/21   [name], [name], MD  ?ezetimibe (ZETIA) 10 MG tablet Take 1 tablet (10 mg total) by mouth daily. 02/07/21   [name], [name], MD  ?famotidine (PEPCID) 20 MG tablet Take 1 tablet (20 mg total) by mouth daily. 08/15/21   [name], [name], MD  ?fluticasone (FLONASE) 50 MCG/ACT nasal spray Place 2 sprays into both nostrils daily as needed for allergies or rhinitis.    [provider]  ?hydrochlorothiazide (HYDRODIURIL) 25 MG tablet Take 1 tablet (25 mg total) by mouth daily. 02/07/21   [name], [name], MD  ?isosorbide mononitrate (IMDUR) 60 MG 24 hr tablet Take 2 tablets (120 mg total) by mouth daily.  02/07/21   [name], [name], MD  ?latanoprost (XALATAN) 0.005 % ophthalmic solution Place 1 drop into both eyes at bedtime.  05/28/20   [provider]  ?levocetirizine (XYZAL) 5 MG tablet Take 5 mg by mouth daily.    [provider]  ?losartan (COZAAR) 100 MG tablet Take 1 tablet (100 mg total) by mouth daily. 02/07/21   Jinny Sanders, MD  ?metFORMIN (GLUCOPHAGE-XR) 500 MG 24 hr tablet Take 1 tablet (500 mg total) by mouth daily with breakfast. 08/15/21   [name], [name], MD  ?montelukast (SINGULAIR) 10 MG tablet Take 1 tablet (10 mg total) by mouth at bedtime. 02/07/21   Jinny Sanders, MD  ?Multiple Vitamin (MULTIVITAMIN WITH MINERALS) TABS tablet Take 1 tablet by mouth daily.    [provider]  ?Multiple Vitamins-Minerals (EMERGEN-C IMMUNE) PACK Take 1 packet by mouth daily.    [provider]  ?nystatin cream (MYCOSTATIN) Apply 1 application topically 2 (two) times daily. External application 2/37/62   [name], [name], MD  ?pantoprazole (PROTONIX) 40 MG tablet TAKE 1 TABLET DAILY 07/09/21   [name], [name], MD  ?predniSONE (DELTASONE) 20 MG tablet 3 tabs by mouth daily x 3 days, then 2 tabs by mouth daily x 2 days then 1 tab by mouth daily x 2  days 07/11/21   Jinny Sanders, MD  ?rosuvastatin (CRESTOR) 10 MG tablet Take 2 tablets (20 mg total) by mouth daily. 02/18/21 02/13/22  Buford Dresser, MD  ?   ? ?Allergies    ?Lactose intolerance (gi), Nickel, and Penicillins   ? ?Review of Systems   ?Review of Systems  ?Constitutional:  Negative for fever.  ?HENT:  Negative for ear pain.   ?Eyes:  Negative for pain.  ?Respiratory:  Negative for cough.   ?Cardiovascular:  Negative for chest pain.  ?Gastrointestinal:  Negative for abdominal pain.  ?Genitourinary:  Negative for flank pain.  ?Musculoskeletal:  Negative for back pain.  ?Skin:  Negative for rash.  ?Neurological:  Negative for headaches.  ? ?Physical Exam ?Updated Vital Signs ?BP 112/66 (BP Location: Right Arm)   Pulse 81    Temp 98.1 ?F (36.7 ?C)   Resp 16   Ht 5\' 9"  (1.753 m)   Wt 118.8 kg   SpO2 98%   BMI 38.69 kg/m?  ?Physical Exam ?Constitutional:   ?   General: She is not in acute distress. ?   Appearance: Normal appearance.  ?HENT:  ?   Head: Normocephalic.  ?   Nose: Nose normal.  ?Eyes:  ?   Extraocular Movements: Extraocular movements intact.  ?Cardiovascular:  ?   Rate and Rhythm: Normal rate.  ?Pulmonary:  ?   Effort: Pulmonary effort is normal.  ?Musculoskeletal:     ?   General: Normal range of motion.  ?   Cervical back: Normal range of motion.  ?Neurological:  ?   General: No focal deficit present.  ?   Mental Status: She is alert and oriented to person, place, and time. Mental status is at baseline.  ?   Cranial Nerves: No cranial nerve deficit.  ?   Motor: No weakness.  ? ? ?ED Results / Procedures / Treatments   ?Labs ?(all labs ordered are listed, but only abnormal results are displayed) ?Labs Reviewed  ?BASIC METABOLIC PANEL - Abnormal; Notable for the following components:  ?    Result Value  ? Sodium 133 (*)   ? Chloride 97 (*)   ? Glucose, Bld 472 (*)   ? All other components within normal limits  ?URINALYSIS, ROUTINE W REFLEX MICROSCOPIC - Abnormal; Notable for the following components:  ? Color, Urine COLORLESS (*)   ? Specific Gravity, Urine 1.032 (*)   ? Glucose, UA >1,000 (*)   ? Hgb urine dipstick TRACE (*)   ? Ketones, ur 15 (*)   ? Bacteria, UA FEW (*)   ? All other components within normal limits  ?CBG MONITORING, ED - Abnormal; Notable for the following components:  ? Glucose-Capillary 476 (*)   ? All other components within normal limits  ?CBG MONITORING, ED - Abnormal; Notable for the following components:  ? Glucose-Capillary 366 (*)   ? All other components within normal limits  ?CBG MONITORING, ED - Abnormal; Notable for the following components:  ? Glucose-Capillary 342 (*)   ? All other components within normal limits  ?CBC  ?PREGNANCY, URINE  ? ? ?EKG ?None ? ?Radiology ?No results  found. ? ?Procedures ?Procedures  ? ? ?Medications Ordered in ED ?Medications  ?sodium chloride 0.9 % bolus 1,000 mL (0 mLs Intravenous Stopped 08/29/21 1401)  ?sodium chloride 0.9 % bolus 1,000 mL (0 mLs Intravenous Stopped 08/29/21 1401)  ?insulin aspart (novoLOG) injection 12 Units (12 Units Subcutaneous Given 08/29/21 1400)  ? ? ?ED Course/ Medical Decision  Making/ A&P ?  ?                        ?Medical Decision Making ?Amount and/or Complexity of Data Reviewed ?External Data Reviewed: notes. ?   Details: Primary care visit with recent diagnosis of diabetes. ?Labs: ordered. ?   Details: CBC, chemistry.  Glucose elevated greater than 400. ? ?Risk ?Prescription drug management. ?Risk Details: Patient given 2 L bolus of IV fluids and subcu insulin with improvement of blood sugar. ? ?Patient advised to increase her metformin to 1000 units twice daily.  Advise continued blood glucose checks daily 3 times a day. ? ?Advised her to call her primary care doctor's office today and have close continued monitoring of her blood sugars. ? ?No evidence of DKA today. ? ? ? ? ? ? ? ? ? ? ?Final Clinical Impression(s) / ED Diagnoses ?Final diagnoses:  ?Other specified diabetes mellitus without complication, without long-term current use of insulin ([place])  ? ? ?Rx / DC Orders ?ED Discharge Orders   ? ? None  ? ?  ? ? ?  ?Luna Fuse, MD ?08/29/21 1511 ? ?

## 2021-08-29 NOTE — Discharge Instructions (Addendum)
Increase your metformin to 1000 units twice daily. ? ?Call your primary care doctor after discharge to arrange close follow-up and continued monitoring of your blood sugar.  Continue checking blood sugar 3 times a day. ? ?Return immediately back to the ER if: ? ?Your symptoms worsen within the next 12-24 hours. ?You develop new symptoms such as new fevers, persistent vomiting, new pain, shortness of breath, or new weakness or numbness, or if you have any other concerns. ? ?

## 2021-08-29 NOTE — ED Notes (Signed)
Pt discharged home after verbalizing understanding of discharge instructions; nad noted. 

## 2021-09-01 ENCOUNTER — Other Ambulatory Visit: Payer: Self-pay | Admitting: Family Medicine

## 2021-09-01 MED ORDER — FLUCONAZOLE 150 MG PO TABS: 150.0000 mg | ORAL_TABLET | Freq: Once | ORAL | 0 refills | Status: AC

## 2021-09-02 MED ORDER — FLUCONAZOLE 150 MG PO TABS: 150.0000 mg | ORAL_TABLET | Freq: Once | ORAL | 0 refills | Status: AC

## 2021-09-03 ENCOUNTER — Telehealth: Payer: Self-pay | Admitting: Family Medicine

## 2021-09-03 DIAGNOSIS — E1159 Type 2 diabetes mellitus with other circulatory complications: Secondary | ICD-10-CM

## 2021-09-03 NOTE — Telephone Encounter (Signed)
-----   Message from Ellamae Sia sent at 09/02/2021  4:34 PM EST ----- ?Regarding: lab orders for Friday, 3.17.23 ?Patient is scheduled for CPX labs, please order future labs, Thanks , Terri ? ? ?

## 2021-09-05 NOTE — Addendum Note (Signed)
Addended by: Eliezer Lofts E on: 09/05/2021 06:14 PM ? ? Modules accepted: Orders ? ?

## 2021-09-08 ENCOUNTER — Other Ambulatory Visit: Payer: Self-pay | Admitting: Family Medicine

## 2021-09-08 MED ORDER — OZEMPIC (0.25 OR 0.5 MG/DOSE) 2 MG/1.5ML ~~LOC~~ SOPN: 0.2500 mg | PEN_INJECTOR | SUBCUTANEOUS | 11 refills | Status: DC

## 2021-09-12 ENCOUNTER — Other Ambulatory Visit: Payer: Self-pay

## 2021-09-12 ENCOUNTER — Other Ambulatory Visit (INDEPENDENT_AMBULATORY_CARE_PROVIDER_SITE_OTHER): Payer: BC Managed Care – PPO

## 2021-09-12 DIAGNOSIS — E1159 Type 2 diabetes mellitus with other circulatory complications: Secondary | ICD-10-CM | POA: Diagnosis not present

## 2021-09-12 LAB — COMPREHENSIVE METABOLIC PANEL
ALT: 31 U/L (ref 0–35)
AST: 17 U/L (ref 0–37)
Albumin: 4.1 g/dL (ref 3.5–5.2)
Alkaline Phosphatase: 61 U/L (ref 39–117)
BUN: 11 mg/dL (ref 6–23)
CO2: 27 mEq/L (ref 19–32)
Calcium: 9.3 mg/dL (ref 8.4–10.5)
Chloride: 105 mEq/L (ref 96–112)
Creatinine, Ser: 0.83 mg/dL (ref 0.40–1.20)
GFR: 76.91 mL/min (ref >60.00)
Glucose, Bld: 245 mg/dL — ABNORMAL HIGH (ref 70–99)
Potassium: 3.4 mEq/L — ABNORMAL LOW (ref 3.5–5.1)
Sodium: 141 mEq/L (ref 135–145)
Total Bilirubin: 0.8 mg/dL (ref 0.2–1.2)
Total Protein: 6.7 g/dL (ref 6.0–8.3)

## 2021-09-12 LAB — LIPID PANEL
Cholesterol: 116 mg/dL (ref 0–200)
HDL: 50.8 mg/dL (ref >39.00)
LDL Cholesterol: 37 mg/dL (ref 0–99)
NonHDL: 64.77
Total CHOL/HDL Ratio: 2
Triglycerides: 138 mg/dL (ref 0.0–149.0)
VLDL: 27.6 mg/dL (ref 0.0–40.0)

## 2021-09-12 NOTE — Progress Notes (Signed)
No critical labs need to be addressed urgently. We will discuss labs in detail at upcoming office visit.   

## 2021-09-16 ENCOUNTER — Ambulatory Visit (INDEPENDENT_AMBULATORY_CARE_PROVIDER_SITE_OTHER): Payer: BC Managed Care – PPO | Admitting: Family Medicine

## 2021-09-16 ENCOUNTER — Encounter: Payer: Self-pay | Admitting: Family Medicine

## 2021-09-16 ENCOUNTER — Other Ambulatory Visit: Payer: Self-pay

## 2021-09-16 VITALS — BP 132/84 | HR 86 | Ht 69.0 in | Wt 271.0 lb

## 2021-09-16 DIAGNOSIS — I25119 Atherosclerotic heart disease of native coronary artery with unspecified angina pectoris: Secondary | ICD-10-CM

## 2021-09-16 DIAGNOSIS — I152 Hypertension secondary to endocrine disorders: Secondary | ICD-10-CM

## 2021-09-16 DIAGNOSIS — E785 Hyperlipidemia, unspecified: Secondary | ICD-10-CM

## 2021-09-16 DIAGNOSIS — Z0000 Encounter for general adult medical examination without abnormal findings: Secondary | ICD-10-CM | POA: Diagnosis not present

## 2021-09-16 DIAGNOSIS — Z6841 Body Mass Index (BMI) 40.0 and over, adult: Secondary | ICD-10-CM

## 2021-09-16 DIAGNOSIS — E1159 Type 2 diabetes mellitus with other circulatory complications: Secondary | ICD-10-CM | POA: Diagnosis not present

## 2021-09-16 NOTE — Patient Instructions (Addendum)
Increase dose of Ozempic to 0.5 mg weekly. ? ? Send blood sugar up date after 2 weeks. ? Continue metformin at current dose. ? Keep working  on low carb diet, increase potassium. ? Work on  regular exercise. Keep up with water intake. ?Keep appt with diabetes nutritionist. ?Set up yearly eye exam for diabetes and have the opthalmologist send [place] a copy of the evaluation for the chart. ? ? Consider Shingrix series and COVID bivalent booster. ?

## 2021-09-16 NOTE — Progress Notes (Signed)
? ? Patient ID: Christine Barrett, female    DOB: May 22, 1961, 61 y.o.   MRN: [id number] ? ?This visit was conducted in person. ? ?BP 132/84   Pulse 86   Ht _0  (1.753 m)   Wt 271 lb (122.9 kg)   SpO2 97%   BMI 40.02 kg/m?   ? ?CC:  ?Chief Complaint  ?Patient presents with  ? Annual Exam  ? ? ?Subjective:  ? ?HPI: ?Christine Barrett is a 61 y.o. female presenting on 09/16/2021 for Annual Exam ? ? DM: Metfomrin makes her nauseous off and on 23 hour after she takes it. ? 500 mg 2 tabs BID. ?  She has take ozempic 2 doses so far. No abd pain. ? She does feel more full lately. ? Keep up water. ? ?FBS 211-243 ?2 hour postprandial 273 ? ?   ?Wt Readings from Last 3 Encounters:  ?09/16/21 271 lb (122.9 kg)  ?08/29/21 262 lb (118.8 kg)  ?08/27/21 265 lb (120.2 kg)  ? ?Elevated Cholesterol: Now at goal LDL <  70! ? On crestor 20 mg daily ? Zetia 10 mg daily ?Using medications without problems: ?Muscle aches:  ?Diet compliance: working on low carb diet. ?Exercise: walking more as cramps are better. ?Other complaints: ? ? Did not tolerate stopping HCTZ.. so restarted. ? BP < 140/90 at home ? ?BP Readings from Last 3 Encounters:  ?09/16/21 132/84  ?08/29/21 112/66  ?08/27/21 130/82  ? ?Body mass index is 40.02 kg/m?. ? ? ?Relevant past medical, surgical, family and social history reviewed and updated as indicated. Interim medical history since our last visit reviewed. ?Allergies and medications reviewed and updated. ?Outpatient Medications Prior to Visit  ?Medication Sig Dispense Refill  ? albuterol (VENTOLIN HFA) 108 (90 Base) MCG/ACT inhaler INHALE 2 PUFFS BY MOUTH EVERY 6 HOURS AS NEEDED FOR WHEEZING OR  SHORTNESS  OF  BREATH 18 g 3  ? amLODipine (NORVASC) 5 MG tablet Take 1 tablet (5 mg total) by mouth daily. 180 tablet 3  ? aspirin EC 81 MG EC tablet Take 1 tablet (81 mg total) by mouth daily.    ? BLACK ELDERBERRY PO Take 2 each by mouth daily.    ? Blood Glucose Monitoring Suppl (ONETOUCH VERIO) w/Device KIT Use to check blood  sugar 2 times a day 1 kit 0  ? carvedilol (COREG) 25 MG tablet TAKE 1 TABLET BY MOUTH TWICE DAILY WITH A MEAL 180 tablet 3  ? clotrimazole (LOTRIMIN) 1 % cream Apply 1 application topically 2 (two) times daily. 30 g 0  ? ezetimibe (ZETIA) 10 MG tablet Take 1 tablet (10 mg total) by mouth daily. 90 tablet 3  ? famotidine (PEPCID) 20 MG tablet Take 1 tablet (20 mg total) by mouth daily. 90 tablet 3  ? fluticasone (FLONASE) 50 MCG/ACT nasal spray Place 2 sprays into both nostrils daily as needed for allergies or rhinitis.    ? glucose blood (ONETOUCH VERIO) test strip Use to check blood sugar 2 times a day 100 each 5  ? hydrochlorothiazide (HYDRODIURIL) 25 MG tablet Take 1 tablet (25 mg total) by mouth daily. 90 tablet 3  ? isosorbide mononitrate (IMDUR) 60 MG 24 hr tablet Take 2 tablets (120 mg total) by mouth daily. 180 tablet 3  ? Lancet Devices (ONE TOUCH DELICA LANCING DEV) MISC Use to check blood sugar 2 times a day 1 each 0  ? latanoprost (XALATAN) 0.005 % ophthalmic solution Place 1 drop into both eyes at bedtime.     ?  levocetirizine (XYZAL) 5 MG tablet Take 5 mg by mouth daily.    ? losartan (COZAAR) 100 MG tablet Take 1 tablet (100 mg total) by mouth daily. 90 tablet 3  ? metFORMIN (GLUCOPHAGE-XR) 500 MG 24 hr tablet Take 2 tablets (1,000 mg total) by mouth 2 (two) times daily. Increase  to this dose over time as instructed 120 tablet 11  ? montelukast (SINGULAIR) 10 MG tablet Take 1 tablet (10 mg total) by mouth at bedtime. 90 tablet 3  ? Multiple Vitamin (MULTIVITAMIN WITH MINERALS) TABS tablet Take 1 tablet by mouth daily.    ? Multiple Vitamins-Minerals (EMERGEN-C IMMUNE) PACK Take 1 packet by mouth daily.    ? nystatin cream (MYCOSTATIN) Apply 1 application topically 2 (two) times daily. External application 30 g 0  ? OneTouch Delica Lancets 58I MISC Use to check blood sugar 2 times a day 100 each 5  ? pantoprazole (PROTONIX) 40 MG tablet TAKE 1 TABLET DAILY 90 tablet 0  ? predniSONE (DELTASONE) 20 MG  tablet 3 tabs by mouth daily x 3 days, then 2 tabs by mouth daily x 2 days then 1 tab by mouth daily x 2 days 15 tablet 0  ? rosuvastatin (CRESTOR) 10 MG tablet Take 2 tablets (20 mg total) by mouth daily. 180 tablet 3  ? Semaglutide,0.25 or 0.5MG/DOS, (OZEMPIC, 0.25 OR 0.5 MG/DOSE,) 2 MG/1.5ML SOPN Inject 0.25 mg into the skin once a week. 1.5 mL 11  ? ?No facility-administered medications prior to visit.  ?  ? ?Per HPI unless specifically indicated in ROS section below ?Review of Systems  ?Constitutional:  Negative for fatigue and fever.  ?HENT:  Negative for congestion.   ?Eyes:  Negative for pain.  ?Respiratory:  Negative for cough and shortness of breath.   ?Cardiovascular:  Negative for chest pain, palpitations and leg swelling.  ?Gastrointestinal:  Negative for abdominal pain.  ?Genitourinary:  Negative for dysuria and vaginal bleeding.  ?Musculoskeletal:  Negative for back pain.  ?Neurological:  Negative for syncope, light-headedness and headaches.  ?Psychiatric/Behavioral:  Negative for dysphoric mood.   ?Objective:  ?BP 132/84   Pulse 86   Ht _0  (1.753 m)   Wt 271 lb (122.9 kg)   SpO2 97%   BMI 40.02 kg/m?   ?Wt Readings from Last 3 Encounters:  ?09/16/21 271 lb (122.9 kg)  ?08/29/21 262 lb (118.8 kg)  ?08/27/21 265 lb (120.2 kg)  ?  ?  ?Physical Exam ?Vitals and nursing note reviewed.  ?Constitutional:   ?   General: She is not in acute distress. ?   Appearance: Normal appearance. She is well-developed. She is not ill-appearing or toxic-appearing.  ?HENT:  ?   Head: Normocephalic.  ?   Right Ear: Hearing, tympanic membrane, ear canal and external ear normal.  ?   Left Ear: Hearing, tympanic membrane, ear canal and external ear normal.  ?   Nose: Nose normal.  ?Eyes:  ?   General: Lids are normal. Lids are everted, no foreign bodies appreciated.  ?   Conjunctiva/sclera: Conjunctivae normal.  ?   Pupils: Pupils are equal, round, and reactive to light.  ?Neck:  ?   Thyroid: No thyroid mass or  thyromegaly.  ?   Vascular: No carotid bruit.  ?   Trachea: Trachea normal.  ?Cardiovascular:  ?   Rate and Rhythm: Normal rate and regular rhythm.  ?   Heart sounds: Normal heart sounds, S1 normal and S2 normal. No murmur heard. ?  No gallop.  ?  Pulmonary:  ?   Effort: Pulmonary effort is normal. No respiratory distress.  ?   Breath sounds: Normal breath sounds. No wheezing, rhonchi or rales.  ?Abdominal:  ?   General: Bowel sounds are normal. There is no distension or abdominal bruit.  ?   Palpations: Abdomen is soft. There is no fluid wave or mass.  ?   Tenderness: There is no abdominal tenderness. There is no guarding or rebound.  ?   Hernia: No hernia is present.  ?Musculoskeletal:  ?   Cervical back: Normal range of motion and neck supple.  ?Lymphadenopathy:  ?   Cervical: No cervical adenopathy.  ?Skin: ?   General: Skin is warm and dry.  ?   Findings: No rash.  ?Neurological:  ?   Mental Status: She is alert.  ?   Cranial Nerves: No cranial nerve deficit.  ?   Sensory: No sensory deficit.  ?Psychiatric:     ?   Mood and Affect: Mood is not anxious or depressed.     ?   Speech: Speech normal.     ?   Behavior: Behavior normal. Behavior is cooperative.     ?   Judgment: Judgment normal.  ? ?   ?Results for orders placed or performed in visit on 09/12/21  ?Comprehensive metabolic panel  ?Result Value Ref Range  ? Sodium 141 135 - 145 mEq/L  ? Potassium 3.4 (L) 3.5 - 5.1 mEq/L  ? Chloride 105 96 - 112 mEq/L  ? CO2 27 19 - 32 mEq/L  ? Glucose, Bld 245 (H) 70 - 99 mg/dL  ? BUN 11 6 - 23 mg/dL  ? Creatinine, Ser 0.83 0.40 - 1.20 mg/dL  ? Total Bilirubin 0.8 0.2 - 1.2 mg/dL  ? Alkaline Phosphatase 61 39 - 117 U/L  ? AST 17 0 - 37 U/L  ? ALT 31 0 - 35 U/L  ? Total Protein 6.7 6.0 - 8.3 g/dL  ? Albumin 4.1 3.5 - 5.2 g/dL  ? GFR 76.91 >60.00 mL/min  ? Calcium 9.3 8.4 - 10.5 mg/dL  ?Lipid panel  ?Result Value Ref Range  ? Cholesterol 116 0 - 200 mg/dL  ? Triglycerides 138.0 0.0 - 149.0 mg/dL  ? HDL 50.80 >39.00 mg/dL  ?  VLDL 27.6 0.0 - 40.0 mg/dL  ? LDL Cholesterol 37 0 - 99 mg/dL  ? Total CHOL/HDL Ratio 2   ? NonHDL 64.77   ? ? ?This visit occurred during the SARS-CoV-2 public health emergency.  Safety protocols were in place, inclu

## 2021-09-17 DIAGNOSIS — Z1231 Encounter for screening mammogram for malignant neoplasm of breast: Secondary | ICD-10-CM | POA: Diagnosis not present

## 2021-09-17 LAB — HM MAMMOGRAPHY

## 2021-09-22 ENCOUNTER — Encounter: Payer: Self-pay | Admitting: Family Medicine

## 2021-09-29 ENCOUNTER — Encounter: Payer: Self-pay | Admitting: Family Medicine

## 2021-10-02 ENCOUNTER — Telehealth: Payer: Self-pay | Admitting: *Deleted

## 2021-10-02 ENCOUNTER — Other Ambulatory Visit: Payer: Self-pay | Admitting: Family Medicine

## 2021-10-02 MED ORDER — OZEMPIC (1 MG/DOSE) 4 MG/3ML ~~LOC~~ SOPN: 1.0000 mg | PEN_INJECTOR | SUBCUTANEOUS | 11 refills | Status: DC

## 2021-10-02 NOTE — Telephone Encounter (Signed)
Received fax from [facility] requesting PA for Ozempic '4mg'$ /31m.  PA forms completed for [employer] and faxed to [phone] ?

## 2021-10-07 ENCOUNTER — Telehealth: Payer: Self-pay

## 2021-10-07 MED ORDER — OZEMPIC (1 MG/DOSE) 4 MG/3ML ~~LOC~~ SOPN: 1.0000 mg | PEN_INJECTOR | SUBCUTANEOUS | 3 refills | Status: DC

## 2021-10-07 NOTE — Addendum Note (Signed)
Addended by: Carter Kitten on: 10/07/2021 03:05 PM ? ? Modules accepted: Orders ? ?

## 2021-10-07 NOTE — Telephone Encounter (Signed)
Patient called states that local pharmacy does not have abatable. [facility]  ?

## 2021-10-07 NOTE — Telephone Encounter (Signed)
Ozempic prescription sent to [facility] as requested.  ?

## 2021-10-07 NOTE — Telephone Encounter (Signed)
Please clarify phone message. ?

## 2021-10-08 NOTE — Telephone Encounter (Signed)
PA approved from 10/06/21 through 10/06/2022. ?

## 2021-10-16 ENCOUNTER — Encounter: Payer: BC Managed Care – PPO | Attending: Family Medicine

## 2021-10-20 ENCOUNTER — Encounter: Payer: Self-pay | Admitting: Family Medicine

## 2021-10-23 ENCOUNTER — Ambulatory Visit: Payer: BC Managed Care – PPO

## 2021-10-28 ENCOUNTER — Encounter: Payer: Self-pay | Admitting: Family Medicine

## 2021-10-29 DIAGNOSIS — I25119 Atherosclerotic heart disease of native coronary artery with unspecified angina pectoris: Secondary | ICD-10-CM | POA: Insufficient documentation

## 2021-10-29 NOTE — Assessment & Plan Note (Signed)
Stable, chronic.  Continue current medication.  HCTZ 25 mg daily 

## 2021-10-29 NOTE — Assessment & Plan Note (Signed)
Chronic, recent diagnosis ? ?Increase dose of Ozempic to 0.5 mg weekly. ? ? Send blood sugar up date after 2 weeks. ? Continue metformin at current dose. ? Keep working  on low carb diet, increase potassium. ? Work on  regular exercise. Keep up with water intake. ?Keep appt with diabetes nutritionist. ?Set up yearly eye exam for diabetes and have the opthalmologist send [place] a copy of the evaluation for the chart. ?

## 2021-10-29 NOTE — Assessment & Plan Note (Signed)
Encouraged exercise, weight loss, healthy eating habits. ?Started on GLP-1 which will assist with weight management. ?

## 2021-10-29 NOTE — Assessment & Plan Note (Signed)
Stable, chronic.  Continue current medication. ?Now at goal LDL <  70! ? On crestor 20 mg daily ? Zetia 10 mg daily ?

## 2021-10-29 NOTE — Assessment & Plan Note (Addendum)
Followed by cardiology. ?GLP 1 we will also provide cardiac protection. ?

## 2021-10-30 ENCOUNTER — Ambulatory Visit: Payer: BC Managed Care – PPO

## 2021-10-31 ENCOUNTER — Other Ambulatory Visit: Payer: Self-pay | Admitting: Family Medicine

## 2021-10-31 MED ORDER — ONDANSETRON HCL 4 MG PO TABS: 4.0000 mg | ORAL_TABLET | Freq: Three times a day (TID) | ORAL | 0 refills | Status: DC | PRN

## 2021-11-05 ENCOUNTER — Other Ambulatory Visit: Payer: Self-pay | Admitting: Family Medicine

## 2021-11-05 NOTE — Telephone Encounter (Signed)
Pt called and wanted to leave a message for Dr. [name] and the nurse, she would like to speak with the nurse about this medication:  ?Semaglutide, 1 MG/DOSE, (OZEMPIC, 1 MG/DOSE,) 4 MG/3ML SOPN ? ?Callback Number: [phone] ?

## 2021-11-05 NOTE — Telephone Encounter (Signed)
Spoke with [employer].  She is asking for a new Rx for semaglutide 0.5 mg to be sent to [facility] on [address].  She is having too many side effects with the 1 mg and the pharmacy told her they thought her insurance would pay for the 0.5 mg since it is a change in dosing.  She only wants a 1 month supply sent in since she is unsure she will be able to continue with this medication because of the side effects.  If okay to send Rx, please sign order below.  ?

## 2021-11-06 ENCOUNTER — Encounter: Payer: Self-pay | Admitting: Podiatry

## 2021-11-06 ENCOUNTER — Ambulatory Visit (INDEPENDENT_AMBULATORY_CARE_PROVIDER_SITE_OTHER): Payer: BC Managed Care – PPO

## 2021-11-06 ENCOUNTER — Ambulatory Visit: Payer: BC Managed Care – PPO | Admitting: Podiatry

## 2021-11-06 DIAGNOSIS — M722 Plantar fascial fibromatosis: Secondary | ICD-10-CM | POA: Diagnosis not present

## 2021-11-06 MED ORDER — MELOXICAM 15 MG PO TABS: 15.0000 mg | ORAL_TABLET | Freq: Every day | ORAL | 3 refills | Status: DC

## 2021-11-06 MED ORDER — TRIAMCINOLONE ACETONIDE 40 MG/ML IJ SUSP
20.0000 mg | Freq: Once | INTRAMUSCULAR | Status: AC
  Administered 2021-11-06: 20 mg

## 2021-11-06 MED ORDER — OZEMPIC (0.25 OR 0.5 MG/DOSE) 2 MG/1.5ML ~~LOC~~ SOPN: 0.5000 mg | PEN_INJECTOR | SUBCUTANEOUS | 0 refills | Status: DC

## 2021-11-08 NOTE — Progress Notes (Signed)
?Subjective:  ?Patient ID: Christine Barrett, female    DOB: 17-Nov-1960,  MRN: [id number] ?HPI ?Chief Complaint  ?Patient presents with  ? Foot Pain  ?  Plantar heel right - aching, sharp pain x 1 year, tried rolling ball, stretching, sport med doc gave arch sleeve, night splint  ? Diabetes  ?  Diabetic - last a1c was 11.4 (Feb 2023-1st dx)  ? New Patient (Initial Visit)  ? ? ?61 y.o. female presents with the above complaint.  ? ?ROS: Denies fever chills nausea vomiting muscle aches pains calf pain back pain chest pain shortness of breath. ? ?Past Medical History:  ?Diagnosis Date  ? Allergy   ? SEASONAL  ? Anemia   ? CAD S/P percutaneous coronary angioplasty 09/2013  ? a) Ostial AV G Cx - 2.5 mm Angiosculpt; mid LAD 40-60%; b) Myoview 07/2014: LOW RISK, small-severe fixed inferior defect c/w infarct w/o peri-infarct ischemia.  ? Chronic bronchitis ([place])   ? "frequently; not q yr" (10/03/2013)  ? Diabetes mellitus without complication ([place])   ? Type 2-diet controlled.   ? Diverticulosis   ? Essential hypertension   ? with prior Accelerated HTN  ? GERD (gastroesophageal reflux disease)   ? Hiatal hernia   ? Hx of non-ST elevation myocardial infarction (NSTEMI) 10/01/2013  ? Due to Accelerated HTN with existing CAD  ? Hyperlipemia   ? Migraine   ? "@ least once/month" (10/03/2013)  ? OSA (obstructive sleep apnea) 06/10/2016  ? Schatzki's ring   ? Seasonal allergies   ? Sinusitis   ? Spinal headache   ? during C-section of second child.   ? ?Past Surgical History:  ?Procedure Laterality Date  ? ABDOMINAL HYSTERECTOMY  1994  ? "partial"  ? CAPSULAR RELEASE Right 06/06/2020  ? Procedure: RIGHT SHOULDER MANIPULATION UNDER ANESTHESIA, ROTATOR CUFF REPAIR;  Surgeon: Meredith Pel, MD;  Location: [facility];  Service: Orthopedics;  Laterality: Right;  ? CARDIAC CATHETERIZATION N/A 08/05/2015  ? Procedure: Left Heart Cath and Coronary Angiography;  Surgeon: [name], MD;  Location: [facility];  Service: Cardiovascular;   Laterality: N/A;  ? [place]  ? COLONOSCOPY    ? CORONARY ANGIOPLASTY  10/03/2013  ? 95% ostial AV G Cx - 2.5 mm AngioSculpt Balloon PTCA; mid LAD 40-60%  ? LEFT HEART CATHETERIZATION WITH CORONARY ANGIOGRAM N/A 10/02/2013  ? Procedure: LEFT HEART CATHETERIZATION WITH CORONARY ANGIOGRAM;  Surgeon: [name], MD;  Location: [facility];  Service: Cardiovascular;  Laterality: N/A;  ? NASAL SEPTOPLASTY W/ TURBINOPLASTY  ~ 2007  ? NM MYOVIEW LTD  08/22/2014  ?  Low risk stress nuclear study with a small, severe, fixed defect in the distal inferior wall/apex suggestive of small prior infarct; no ischemia.  LV Wall Motion:  NL LV Function; NL Wall Motion  ? PERCUTANEOUS CORONARY STENT INTERVENTION (PCI-S) N/A 10/03/2013  ? cutting balloon angioplasty only no stent.   ? SINOSCOPY    ? TRANSTHORACIC ECHOCARDIOGRAM  10/02/2013  ? EF 55-60%; mild LVH, no RWMA,   ? TUBAL LIGATION  1989  ? ? ?Current Outpatient Medications:  ?  meloxicam (MOBIC) 15 MG tablet, Take 1 tablet (15 mg total) by mouth daily., Disp: 30 tablet, Rfl: 3 ?  albuterol (VENTOLIN HFA) 108 (90 Base) MCG/ACT inhaler, INHALE 2 PUFFS BY MOUTH EVERY 6 HOURS AS NEEDED FOR WHEEZING OR  SHORTNESS  OF  BREATH, Disp: 18 g, Rfl: 3 ?  amLODipine (NORVASC) 5 MG tablet, Take 1  tablet (5 mg total) by mouth daily., Disp: 180 tablet, Rfl: 3 ?  aspirin EC 81 MG EC tablet, Take 1 tablet (81 mg total) by mouth daily., Disp: , Rfl:  ?  BLACK ELDERBERRY PO, Take 2 each by mouth daily., Disp: , Rfl:  ?  Blood Glucose Monitoring Suppl (ONETOUCH VERIO) w/Device KIT, Use to check blood sugar 2 times a day, Disp: 1 kit, Rfl: 0 ?  carvedilol (COREG) 25 MG tablet, TAKE 1 TABLET BY MOUTH TWICE DAILY WITH A MEAL, Disp: 180 tablet, Rfl: 3 ?  clotrimazole (LOTRIMIN) 1 % cream, Apply 1 application topically 2 (two) times daily., Disp: 30 g, Rfl: 0 ?  ezetimibe (ZETIA) 10 MG tablet, Take 1 tablet (10 mg total) by mouth daily., Disp: 90 tablet, Rfl: 3 ?  famotidine (PEPCID)  20 MG tablet, Take 1 tablet (20 mg total) by mouth daily., Disp: 90 tablet, Rfl: 3 ?  fluticasone (FLONASE) 50 MCG/ACT nasal spray, Place 2 sprays into both nostrils daily as needed for allergies or rhinitis., Disp: , Rfl:  ?  glucose blood (ONETOUCH VERIO) test strip, Use to check blood sugar 2 times a day, Disp: 100 each, Rfl: 5 ?  hydrochlorothiazide (HYDRODIURIL) 25 MG tablet, Take 1 tablet (25 mg total) by mouth daily., Disp: 90 tablet, Rfl: 3 ?  isosorbide mononitrate (IMDUR) 60 MG 24 hr tablet, Take 2 tablets (120 mg total) by mouth daily., Disp: 180 tablet, Rfl: 3 ?  Lancet Devices (ONE TOUCH DELICA LANCING DEV) MISC, Use to check blood sugar 2 times a day, Disp: 1 each, Rfl: 0 ?  latanoprost (XALATAN) 0.005 % ophthalmic solution, Place 1 drop into both eyes at bedtime. , Disp: , Rfl:  ?  levocetirizine (XYZAL) 5 MG tablet, Take 5 mg by mouth daily., Disp: , Rfl:  ?  losartan (COZAAR) 100 MG tablet, Take 1 tablet (100 mg total) by mouth daily., Disp: 90 tablet, Rfl: 3 ?  metFORMIN (GLUCOPHAGE-XR) 500 MG 24 hr tablet, Take 2 tablets (1,000 mg total) by mouth 2 (two) times daily. Increase  to this dose over time as instructed, Disp: 120 tablet, Rfl: 11 ?  montelukast (SINGULAIR) 10 MG tablet, Take 1 tablet (10 mg total) by mouth at bedtime., Disp: 90 tablet, Rfl: 3 ?  Multiple Vitamin (MULTIVITAMIN WITH MINERALS) TABS tablet, Take 1 tablet by mouth daily., Disp: , Rfl:  ?  Multiple Vitamins-Minerals (EMERGEN-C IMMUNE) PACK, Take 1 packet by mouth daily., Disp: , Rfl:  ?  nystatin cream (MYCOSTATIN), Apply 1 application topically 2 (two) times daily. External application, Disp: 30 g, Rfl: 0 ?  ondansetron (ZOFRAN) 4 MG tablet, Take 1 tablet (4 mg total) by mouth every 8 (eight) hours as needed for nausea or vomiting., Disp: 20 tablet, Rfl: 0 ?  OneTouch Delica Lancets 78I MISC, Use to check blood sugar 2 times a day, Disp: 100 each, Rfl: 5 ?  pantoprazole (PROTONIX) 40 MG tablet, TAKE 1 TABLET DAILY, Disp: 90  tablet, Rfl: 0 ?  predniSONE (DELTASONE) 20 MG tablet, 3 tabs by mouth daily x 3 days, then 2 tabs by mouth daily x 2 days then 1 tab by mouth daily x 2 days, Disp: 15 tablet, Rfl: 0 ?  rosuvastatin (CRESTOR) 10 MG tablet, Take 2 tablets (20 mg total) by mouth daily., Disp: 180 tablet, Rfl: 3 ?  Semaglutide,0.25 or 0.5MG/DOS, (OZEMPIC, 0.25 OR 0.5 MG/DOSE,) 2 MG/1.5ML SOPN, Inject 0.5 mg into the skin once a week., Disp: 2 mL, Rfl: 0 ? ?  Allergies  ?Allergen Reactions  ? Lactose Intolerance (Gi)   ? Nickel Rash  ?  Nickel jewlery causes bumps and rash on skin  ? Penicillins Rash  ?  Pt states she has taken since intial  reaction without recurrence.  ?Has patient had a PCN reaction causing immediate rash, facial/tongue/throat swelling, SOB or lightheadedness with hypotension:  no ?Has patient had a PCN reaction causing severe rash involving mucus membranes or skin necrosis: no ?Has patient had a PCN reaction that required hospitalization no ?Has patient had a PCN reaction occurring within the last 10 years: no ?If all of the above answers are "NO", then may proceed with Cephalospori  ? ?Review of Systems ?Objective:  ?There were no vitals filed for this visit. ? ?General: Well developed, nourished, in no acute distress, alert and oriented x3  ? ?Dermatological: Skin is warm, dry and supple bilateral. Nails x 10 are well maintained; remaining integument appears unremarkable at this time. There are no open sores, no preulcerative lesions, no rash or signs of infection present. ? ?Vascular: Dorsalis Pedis artery and Posterior Tibial artery pedal pulses are 2/4 bilateral with immedate capillary fill time. Pedal hair growth present. No varicosities and no lower extremity edema present bilateral.  ? ?Neruologic: Grossly intact via light touch bilateral. Vibratory intact via tuning fork bilateral. Protective threshold with Semmes Wienstein monofilament intact to all pedal sites bilateral. Patellar and Achilles deep tendon  reflexes 2+ bilateral. No Babinski or clonus noted bilateral.  ? ?Musculoskeletal: No gross boney pedal deformities bilateral. No pain, crepitus, or limitation noted with foot and ankle range of motion bi

## 2021-12-01 ENCOUNTER — Encounter: Payer: Self-pay | Admitting: Family Medicine

## 2021-12-11 ENCOUNTER — Ambulatory Visit: Payer: BC Managed Care – PPO | Admitting: Podiatry

## 2021-12-12 ENCOUNTER — Encounter: Payer: Self-pay | Admitting: Family Medicine

## 2021-12-12 ENCOUNTER — Ambulatory Visit: Payer: BC Managed Care – PPO | Admitting: Family Medicine

## 2021-12-12 VITALS — BP 132/82 | HR 92 | Temp 98.7°F | Resp 16 | Ht 69.0 in | Wt 264.4 lb

## 2021-12-12 DIAGNOSIS — E1159 Type 2 diabetes mellitus with other circulatory complications: Secondary | ICD-10-CM

## 2021-12-12 LAB — POCT GLYCOSYLATED HEMOGLOBIN (HGB A1C): Hemoglobin A1C: 6.3 % — AB (ref 4.0–5.6)

## 2021-12-12 LAB — HM DIABETES FOOT EXAM

## 2021-12-12 NOTE — Progress Notes (Signed)
Patient ID: Christine Barrett, female    DOB: 09-24-1960, 61 y.o.   MRN: [id number]  This visit was conducted in person.  BP 132/82   Pulse 92   Temp 98.7 F (37.1 C)   Resp 16   Ht 5' 9" (1.753 m)   Wt 264 lb 7 oz (119.9 kg)   SpO2 98%   BMI 39.05 kg/m    CC:  Chief Complaint  Patient presents with   Diabetes    Subjective:   HPI: Christine Barrett is a 61 y.o. female presenting on 12/12/2021 for Diabetes   Diabetes:  She was unable to tolerated metformin or semaglutide... give GI upset.   She was taking ozempic 2 weeks ago given abd pain, bloating and gas despite returing to the lower dose. Lab Results  Component Value Date   HGBA1C 6.3 (A) 12/12/2021  Using medications without difficulties: Hypoglycemic episodes: Hyperglycemic episodes: Feet problems: Blood Sugars averaging: FBS 104-118, postprandial.. not lately.  She is eating low carb diet, minimal exercise.  She has dizziness when she is under 100. eye exam within last year:   Lost 10 lb Wt Readings from Last 3 Encounters:  12/12/21 264 lb 7 oz (119.9 kg)  09/16/21 271 lb (122.9 kg)  08/29/21 262 lb (118.8 kg)          Relevant past medical, surgical, family and social history reviewed and updated as indicated. Interim medical history since our last visit reviewed. Allergies and medications reviewed and updated. Outpatient Medications Prior to Visit  Medication Sig Dispense Refill   albuterol (VENTOLIN HFA) 108 (90 Base) MCG/ACT inhaler INHALE 2 PUFFS BY MOUTH EVERY 6 HOURS AS NEEDED FOR WHEEZING OR  SHORTNESS  OF  BREATH 18 g 3   amLODipine (NORVASC) 5 MG tablet Take 1 tablet (5 mg total) by mouth daily. 180 tablet 3   aspirin EC 81 MG EC tablet Take 1 tablet (81 mg total) by mouth daily.     BLACK ELDERBERRY PO Take 2 each by mouth daily.     Blood Glucose Monitoring Suppl (ONETOUCH VERIO) w/Device KIT Use to check blood sugar 2 times a day 1 kit 0   carvedilol (COREG) 25 MG tablet TAKE 1 TABLET BY MOUTH  TWICE DAILY WITH A MEAL 180 tablet 3   clotrimazole (LOTRIMIN) 1 % cream Apply 1 application topically 2 (two) times daily. 30 g 0   ezetimibe (ZETIA) 10 MG tablet Take 1 tablet (10 mg total) by mouth daily. 90 tablet 3   famotidine (PEPCID) 20 MG tablet Take 1 tablet (20 mg total) by mouth daily. 90 tablet 3   fluticasone (FLONASE) 50 MCG/ACT nasal spray Place 2 sprays into both nostrils daily as needed for allergies or rhinitis.     glucose blood (ONETOUCH VERIO) test strip Use to check blood sugar 2 times a day 100 each 5   hydrochlorothiazide (HYDRODIURIL) 25 MG tablet Take 1 tablet (25 mg total) by mouth daily. 90 tablet 3   isosorbide mononitrate (IMDUR) 60 MG 24 hr tablet Take 2 tablets (120 mg total) by mouth daily. 180 tablet 3   Lancet Devices (ONE TOUCH DELICA LANCING DEV) MISC Use to check blood sugar 2 times a day 1 each 0   latanoprost (XALATAN) 0.005 % ophthalmic solution Place 1 drop into both eyes at bedtime.      levocetirizine (XYZAL) 5 MG tablet Take 5 mg by mouth daily.     losartan (COZAAR) 100 MG tablet Take 1 tablet (  100 mg total) by mouth daily. 90 tablet 3   meloxicam (MOBIC) 15 MG tablet Take 1 tablet (15 mg total) by mouth daily. 30 tablet 3   metFORMIN (GLUCOPHAGE-XR) 500 MG 24 hr tablet Take 2 tablets (1,000 mg total) by mouth 2 (two) times daily. Increase  to this dose over time as instructed (Patient not taking: Reported on 12/12/2021) 120 tablet 11   montelukast (SINGULAIR) 10 MG tablet Take 1 tablet (10 mg total) by mouth at bedtime. 90 tablet 3   Multiple Vitamin (MULTIVITAMIN WITH MINERALS) TABS tablet Take 1 tablet by mouth daily.     Multiple Vitamins-Minerals (EMERGEN-C IMMUNE) PACK Take 1 packet by mouth daily.     nystatin cream (MYCOSTATIN) Apply 1 application topically 2 (two) times daily. External application 30 g 0   ondansetron (ZOFRAN) 4 MG tablet Take 1 tablet (4 mg total) by mouth every 8 (eight) hours as needed for nausea or vomiting. 20 tablet 0    OneTouch Delica Lancets 60F MISC Use to check blood sugar 2 times a day 100 each 5   pantoprazole (PROTONIX) 40 MG tablet TAKE 1 TABLET DAILY 90 tablet 0   predniSONE (DELTASONE) 20 MG tablet 3 tabs by mouth daily x 3 days, then 2 tabs by mouth daily x 2 days then 1 tab by mouth daily x 2 days 15 tablet 0   rosuvastatin (CRESTOR) 10 MG tablet Take 2 tablets (20 mg total) by mouth daily. 180 tablet 3   Semaglutide,0.25 or 0.5MG /DOS, (OZEMPIC, 0.25 OR 0.5 MG/DOSE,) 2 MG/1.5ML SOPN Inject 0.5 mg into the skin once a week. (Patient not taking: Reported on 12/12/2021) 2 mL 0   No facility-administered medications prior to visit.     Per HPI unless specifically indicated in ROS section below Review of Systems  Constitutional:  Negative for fatigue and fever.  HENT:  Negative for congestion.   Eyes:  Negative for pain.  Respiratory:  Negative for cough and shortness of breath.   Cardiovascular:  Negative for chest pain, palpitations and leg swelling.  Gastrointestinal:  Negative for abdominal pain.  Genitourinary:  Negative for dysuria and vaginal bleeding.  Musculoskeletal:  Negative for back pain.  Neurological:  Negative for syncope, light-headedness and headaches.  Psychiatric/Behavioral:  Negative for dysphoric mood.    Objective:  BP 132/82   Pulse 92   Temp 98.7 F (37.1 C)   Resp 16   Ht 5\' 9"  (1.753 m)   Wt 264 lb 7 oz (119.9 kg)   SpO2 98%   BMI 39.05 kg/m   Wt Readings from Last 3 Encounters:  12/12/21 264 lb 7 oz (119.9 kg)  09/16/21 271 lb (122.9 kg)  08/29/21 262 lb (118.8 kg)      Physical Exam Constitutional:      General: She is not in acute distress.    Appearance: Normal appearance. She is well-developed. She is not ill-appearing or toxic-appearing.  HENT:     Head: Normocephalic.     Right Ear: Hearing, tympanic membrane, ear canal and external ear normal. Tympanic membrane is not erythematous, retracted or bulging.     Left Ear: Hearing, tympanic membrane,  ear canal and external ear normal. Tympanic membrane is not erythematous, retracted or bulging.     Nose: No mucosal edema or rhinorrhea.     Right Sinus: No maxillary sinus tenderness or frontal sinus tenderness.     Left Sinus: No maxillary sinus tenderness or frontal sinus tenderness.     Mouth/Throat:  Pharynx: Uvula midline.  Eyes:     General: Lids are normal. Lids are everted, no foreign bodies appreciated.     Conjunctiva/sclera: Conjunctivae normal.     Pupils: Pupils are equal, round, and reactive to light.  Neck:     Thyroid: No thyroid mass or thyromegaly.     Vascular: No carotid bruit.     Trachea: Trachea normal.  Cardiovascular:     Rate and Rhythm: Normal rate and regular rhythm.     Pulses: Normal pulses.     Heart sounds: Normal heart sounds, S1 normal and S2 normal. No murmur heard.    No friction rub. No gallop.  Pulmonary:     Effort: Pulmonary effort is normal. No tachypnea or respiratory distress.     Breath sounds: Normal breath sounds. No decreased breath sounds, wheezing, rhonchi or rales.  Abdominal:     General: Bowel sounds are normal.     Palpations: Abdomen is soft.     Tenderness: There is no abdominal tenderness.  Musculoskeletal:     Cervical back: Normal range of motion and neck supple.  Skin:    General: Skin is warm and dry.     Findings: No rash.  Neurological:     Mental Status: She is alert.  Psychiatric:        Mood and Affect: Mood is not anxious or depressed.        Speech: Speech normal.        Behavior: Behavior normal. Behavior is cooperative.        Thought Content: Thought content normal.        Judgment: Judgment normal.      Diabetic foot exam: Normal inspection No skin breakdown No calluses  Normal DP pulses Normal sensation to light touch and monofilament Nails normal  Results for orders placed or performed in visit on 12/12/21  POCT glycosylated hemoglobin (Hb A1C)  Result Value Ref Range   Hemoglobin A1C 6.3  (A) 4.0 - 5.6 %   HbA1c POC (<> result, manual entry)     HbA1c, POC (prediabetic range)     HbA1c, POC (controlled diabetic range)       COVID 19 screen:  No recent travel or known exposure to COVID19 The patient denies respiratory symptoms of COVID 19 at this time. The importance of social distancing was discussed today.   Assessment and Plan    Problem List Items Addressed This Visit     Type 2 diabetes mellitus with other circulatory complications, HTN, CVD ([place]) - Primary    Chronic, significant improvement in last 3 months.  She initially was taking Ozempic until 2 weeks ago after not tolerating metformin.  She found that on the higher doses as well as the lower doses she had abdominal pain bloating and gas and was unable to tolerate Ozempic.  She has made significant changes with her diet.  She is working aggressively on low-carb diet and plans to start regular exercise.  She is not interested in medication at this time and I think this is reasonable given fasting blood sugars are at goal.  She will monitor postprandials and call if they are consistently above 180.  She will have yearly eye exam.  Foot exam was performed today.  She will follow-up in 3 months for reevaluation.      Relevant Orders   POCT glycosylated hemoglobin (Hb A1C) (Completed)     Eliezer Lofts, MD

## 2021-12-12 NOTE — Assessment & Plan Note (Signed)
Chronic, significant improvement in last 3 months.  She initially was taking Ozempic until 2 weeks ago after not tolerating metformin.  She found that on the higher doses as well as the lower doses she had abdominal pain bloating and gas and was unable to tolerate Ozempic.  She has made significant changes with her diet.  She is working aggressively on low-carb diet and plans to start regular exercise.  She is not interested in medication at this time and I think this is reasonable given fasting blood sugars are at goal.  She will monitor postprandials and call if they are consistently above 180.  She will have yearly eye exam.  Foot exam was performed today.  She will follow-up in 3 months for reevaluation.

## 2021-12-12 NOTE — Patient Instructions (Addendum)
Keep up  healthy eating habits.[name] low carb diet.  Add regular exercise.  Continue on now meds.  Call if 2 hour post prandial ( post meal) sugar  consistently > 200. Set up yearly eye exam for diabetes and have the opthalmologist send [place] a copy of the evaluation for the chart.

## 2021-12-21 ENCOUNTER — Other Ambulatory Visit: Payer: Self-pay | Admitting: Family Medicine

## 2021-12-21 DIAGNOSIS — R1310 Dysphagia, unspecified: Secondary | ICD-10-CM

## 2022-01-13 ENCOUNTER — Encounter: Payer: Self-pay | Admitting: Podiatry

## 2022-01-13 ENCOUNTER — Ambulatory Visit: Payer: BC Managed Care – PPO | Admitting: Podiatry

## 2022-01-13 DIAGNOSIS — M722 Plantar fascial fibromatosis: Secondary | ICD-10-CM | POA: Diagnosis not present

## 2022-01-13 MED ORDER — TRIAMCINOLONE ACETONIDE 40 MG/ML IJ SUSP
20.0000 mg | Freq: Once | INTRAMUSCULAR | Status: AC
  Administered 2022-01-13: 20 mg

## 2022-01-13 MED ORDER — MELOXICAM 15 MG PO TABS: 15.0000 mg | ORAL_TABLET | Freq: Every day | ORAL | 3 refills | Status: DC

## 2022-01-13 NOTE — Progress Notes (Signed)
She presents today for follow-up of her Planter fasciitis of her right foot.  She states that is doing so much better she would guesstimate about 85%.  Objective: She still has pain on palpation medial kidney typical of her right heel with strong palpable pulses.  Assessment: Planter fasciitis right.  Plan: Reinjected the right heel today 20 mg Kenalog 5 mg Marcaine.  Encouraged her to continue her meloxicam.

## 2022-02-24 ENCOUNTER — Ambulatory Visit: Payer: BC Managed Care – PPO | Admitting: Podiatry

## 2022-03-06 ENCOUNTER — Ambulatory Visit (HOSPITAL_COMMUNITY)
Admission: EM | Admit: 2022-03-06 | Discharge: 2022-03-06 | Disposition: A | Discharge status: Home / Self Care | Payer: BC Managed Care – PPO | Attending: Physician Assistant | Admitting: Physician Assistant

## 2022-03-06 ENCOUNTER — Encounter (HOSPITAL_COMMUNITY): Payer: Self-pay

## 2022-03-06 ENCOUNTER — Ambulatory Visit (INDEPENDENT_AMBULATORY_CARE_PROVIDER_SITE_OTHER): Payer: BC Managed Care – PPO

## 2022-03-06 DIAGNOSIS — J069 Acute upper respiratory infection, unspecified: Secondary | ICD-10-CM | POA: Diagnosis not present

## 2022-03-06 DIAGNOSIS — I2510 Atherosclerotic heart disease of native coronary artery without angina pectoris: Secondary | ICD-10-CM | POA: Insufficient documentation

## 2022-03-06 DIAGNOSIS — J302 Other seasonal allergic rhinitis: Secondary | ICD-10-CM | POA: Insufficient documentation

## 2022-03-06 DIAGNOSIS — J3489 Other specified disorders of nose and nasal sinuses: Secondary | ICD-10-CM | POA: Diagnosis not present

## 2022-03-06 DIAGNOSIS — Z8616 Personal history of COVID-19: Secondary | ICD-10-CM | POA: Insufficient documentation

## 2022-03-06 DIAGNOSIS — R059 Cough, unspecified: Secondary | ICD-10-CM

## 2022-03-06 DIAGNOSIS — E119 Type 2 diabetes mellitus without complications: Secondary | ICD-10-CM | POA: Diagnosis not present

## 2022-03-06 DIAGNOSIS — U071 COVID-19: Secondary | ICD-10-CM | POA: Insufficient documentation

## 2022-03-06 DIAGNOSIS — Z79899 Other long term (current) drug therapy: Secondary | ICD-10-CM | POA: Insufficient documentation

## 2022-03-06 DIAGNOSIS — R0602 Shortness of breath: Secondary | ICD-10-CM

## 2022-03-06 DIAGNOSIS — R062 Wheezing: Secondary | ICD-10-CM

## 2022-03-06 LAB — RESP PANEL BY RT-PCR (RSV, FLU A&B, COVID)  RVPGX2
Influenza A by PCR: NEGATIVE
Influenza B by PCR: NEGATIVE
Resp Syncytial Virus by PCR: NEGATIVE
SARS Coronavirus 2 by RT PCR: POSITIVE — AB

## 2022-03-06 MED ORDER — ALBUTEROL SULFATE HFA 108 (90 BASE) MCG/ACT IN AERS: INHALATION_SPRAY | RESPIRATORY_TRACT | 0 refills | Status: DC

## 2022-03-06 MED ORDER — PROMETHAZINE-DM 6.25-15 MG/5ML PO SYRP: 5.0000 mL | ORAL_SOLUTION | Freq: Two times a day (BID) | ORAL | 0 refills | Status: DC | PRN

## 2022-03-06 NOTE — ED Triage Notes (Signed)
Pt has been coughing , body ache , pt states when she breath , she has pain under rib cage ,sneezing, runny nose , low in energy  pt states she may have had a fever not sure. X4 days

## 2022-03-06 NOTE — Discharge Instructions (Signed)
Your x-ray was normal.  I believe that you have a virus.  We will contact you if you are positive for flu, RSV, COVID.  Monitor your MyChart for these results.  Use Mucinex, Flonase, Tylenol for symptom relief.  Use an albuterol inhaler every 4-6 hours as needed for cough and shortness of breath.  Take Promethazine DM up to twice a day as needed for cough.  This will make you sleepy so do not drive or drink alcohol with taking it.  If you have any worsening symptoms including increased cough, shortness of breath, fever, nausea, vomiting, weakness you need to go to the emergency room.

## 2022-03-06 NOTE — ED Provider Notes (Signed)
[place]    CSN: [id number] Arrival date & time: 03/06/22  1435      History   Chief Complaint Chief Complaint  Patient presents with   Cough    HPI Christine Barrett is a 61 y.o. female.   Patient presents today with a 3 to 4-day history of URI symptoms.  Reports cough, body aches, shortness of breath, rhinorrhea.  She denies any chest pain, nausea, vomiting, diarrhea.  Denies any known sick contacts.  When she was initially triaged her oxygen saturation was 91% but after sitting quietly for a few minutes this increased to 97% and stabilized there (at her baseline).  She has been using over-the-counter medications without improvement of symptoms.  She has had COVID with last episode December 2022.  She has had COVID vaccines.  She does have history of allergies and has been taking Flonase and antihistamines as needed.  She does have a history of chronic bronchitis but has not required albuterol inhaler since symptoms began.  Denies any recent antibiotic or steroid use.  She does have a history of heart disease as well as diabetes.  Reports her blood sugars are adequately controlled with diet alone.    Past Medical History:  Diagnosis Date   Allergy    SEASONAL   Anemia    CAD S/P percutaneous coronary angioplasty 09/2013   a) Ostial AV G Cx - 2.5 mm Angiosculpt; mid LAD 40-60%; b) Myoview 07/2014: LOW RISK, small-severe fixed inferior defect c/w infarct w/o peri-infarct ischemia.   Chronic bronchitis ([place])    "frequently; not q yr" (10/03/2013)   Diabetes mellitus without complication (HCC)    Type 2-diet controlled.    Diverticulosis    Essential hypertension    with prior Accelerated HTN   GERD (gastroesophageal reflux disease)    Hiatal hernia    Hx of non-ST elevation myocardial infarction (NSTEMI) 10/01/2013   Due to Accelerated HTN with existing CAD   Hyperlipemia    Migraine    "@ least once/month" (10/03/2013)   OSA (obstructive sleep apnea) 06/10/2016    Schatzki's ring    Seasonal allergies    Sinusitis    Spinal headache    during C-section of second child.     Patient Active Problem List   Diagnosis Date Noted   Coronary artery disease involving native heart with angina pectoris, unspecified vessel or lesion type ([place]) 10/29/2021   Perineal irritation in female 08/15/2021   Increased thirst 08/15/2021   Dysuria 08/15/2021   Plantar fasciitis of right foot 07/04/2021   Trochanteric bursitis of left hip 04/08/2021   Pain of left middle finger 04/08/2021   Other allergic rhinitis 01/01/2021   Frequent episodes of sinusitis 01/01/2021   History of bronchitis 01/01/2021   Allergic conjunctivitis of both eyes 01/01/2021   Family history of breast cancer in sister 09/20/2020   Type 2 diabetes mellitus with other circulatory complications, HTN, CVD ([employer]) 05/17/2020   Morbid obesity ([place]) 05/17/2020   BMI 40.0-44.9, adult ([place]) 05/17/2020   Acute non-recurrent maxillary sinusitis 11/04/2016   Acute pain of both knees 08/28/2016   OSA (obstructive sleep apnea) 06/10/2016   Costochondritis 06/09/2016   Bilateral leg cramps 02/07/2016   Peripheral edema 01/29/2016   Hypertension associated with diabetes ([place])    GERD (gastroesophageal reflux disease) 08/10/2014   Menopausal syndrome 08/10/2014   CAD S/P percutaneous coronary angioplasty - Ostial AVG Cx - Scoring balloon PTCA 10/03/2013   Hyperlipidemia with target LDL less than 70 10/02/2013  H/O non-ST elevation myocardial infarction (NSTEMI) 10/02/2013   Normocytic anemia, not due to blood loss    Allergic sinusitis 03/17/2007   MIGRAINE, COMMON W/O INTRACTABLE MIGRAINE 03/16/2007    Past Surgical History:  Procedure Laterality Date   ABDOMINAL HYSTERECTOMY  1994   "partial"   CAPSULAR RELEASE Right 06/06/2020   Procedure: RIGHT SHOULDER MANIPULATION UNDER ANESTHESIA, ROTATOR CUFF REPAIR;  Surgeon: Meredith Pel, MD;  Location: [facility];  Service: Orthopedics;  Laterality:  Right;   CARDIAC CATHETERIZATION N/A 08/05/2015   Procedure: Left Heart Cath and Coronary Angiography;  Surgeon: [name], MD;  Location: [facility];  Service: Cardiovascular;  Laterality: N/A;   CESAREAN SECTION  1989   COLONOSCOPY     CORONARY ANGIOPLASTY  10/03/2013   95% ostial AV G Cx - 2.5 mm AngioSculpt Balloon PTCA; mid LAD 40-60%   LEFT HEART CATHETERIZATION WITH CORONARY ANGIOGRAM N/A 10/02/2013   Procedure: LEFT HEART CATHETERIZATION WITH CORONARY ANGIOGRAM;  Surgeon: [name], MD;  Location: [facility] C[facility];  Service: Cardiovascular;  Laterality: N/A;   NASAL SEPTOPLASTY W/ TURBINOPLASTY  ~ 2007   NM MYOVIEW LTD  08/22/2014    Low risk stress nuclear study with a small, severe, fixed defect in the distal inferior wall/apex suggestive of small prior infarct; no ischemia.  LV Wall Motion:  NL LV Function; NL Wall Motion   PERCUTANEOUS CORONARY STENT INTERVENTION (PCI-S) N/A 10/03/2013   cutting balloon angioplasty only no stent.    SINOSCOPY     TRANSTHORACIC ECHOCARDIOGRAM  10/02/2013   EF 55-60%; mild LVH, no RWMA,    TUBAL LIGATION  1989    OB History   No obstetric history on file.      Home Medications    Prior to Admission medications   Medication Sig Start Date End Date Taking? Authorizing Provider  promethazine-dextromethorphan (PROMETHAZINE-DM) 6.25-15 MG/5ML syrup Take 5 mLs by mouth 2 (two) times daily as needed for cough. 03/06/22  Yes Raykwon Hobbs K, PA-C  albuterol (VENTOLIN HFA) 108 (90 Base) MCG/ACT inhaler INHALE 2 PUFFS BY MOUTH EVERY 6 HOURS AS NEEDED FOR WHEEZING OR  SHORTNESS  OF  BREATH 03/06/22   Szymon Foiles K, PA-C  amLODipine (NORVASC) 5 MG tablet Take 1 tablet (5 mg total) by mouth daily. 02/18/21   Buford Dresser, MD  aspirin EC 81 MG EC tablet Take 1 tablet (81 mg total) by mouth daily. 10/04/13   Geradine Girt, DO  BLACK ELDERBERRY PO Take 2 each by mouth daily.    [provider]  Blood Glucose Monitoring Suppl  (ONETOUCH VERIO) w/Device KIT Use to check blood sugar 2 times a day 08/29/21   [name], [name], MD  carvedilol (COREG) 25 MG tablet TAKE 1 TABLET BY MOUTH TWICE DAILY WITH A MEAL 02/07/21   [name], [name], MD  clotrimazole (LOTRIMIN) 1 % cream Apply 1 application topically 2 (two) times daily. 07/04/21   [name], [name], MD  famotidine (PEPCID) 20 MG tablet Take 1 tablet (20 mg total) by mouth daily. 08/15/21   [name], [name], MD  fluticasone (FLONASE) 50 MCG/ACT nasal spray Place 2 sprays into both nostrils daily as needed for allergies or rhinitis.    [provider]  glucose blood (ONETOUCH VERIO) test strip Use to check blood sugar 2 times a day 08/29/21   [name], [name], MD  hydrochlorothiazide (HYDRODIURIL) 25 MG tablet Take 1 tablet (25 mg total) by mouth daily. 02/07/21   [name],  [name], MD  isosorbide mononitrate (IMDUR) 60 MG 24 hr tablet Take 2 tablets (120 mg total) by mouth daily. 02/07/21   Jinny Sanders, MD  Lancet Devices (ONE TOUCH DELICA LANCING DEV) MISC Use to check blood sugar 2 times a day 08/29/21   [name], [name], MD  latanoprost (XALATAN) 0.005 % ophthalmic solution Place 1 drop into both eyes at bedtime.  05/28/20   [provider]  levocetirizine (XYZAL) 5 MG tablet Take 5 mg by mouth daily.    [provider]  losartan (COZAAR) 100 MG tablet Take 1 tablet (100 mg total) by mouth daily. 02/07/21   [name], [name], MD  meloxicam (MOBIC) 15 MG tablet Take 1 tablet (15 mg total) by mouth daily. 01/13/22   Hyatt, Max T, DPM  montelukast (SINGULAIR) 10 MG tablet Take 1 tablet (10 mg total) by mouth at bedtime. 02/07/21   Jinny Sanders, MD  Multiple Vitamin (MULTIVITAMIN WITH MINERALS) TABS tablet Take 1 tablet by mouth daily.    [provider]  Multiple Vitamins-Minerals (EMERGEN-C IMMUNE) PACK Take 1 packet by mouth daily.    [provider]  ondansetron (ZOFRAN) 4 MG tablet Take 1 tablet (4 mg total) by mouth every 8 (eight) hours as needed for  nausea or vomiting. 10/31/21   [name], [name], MD  OneTouch Delica Lancets 25Q MISC Use to check blood sugar 2 times a day 08/29/21   [name], [name], MD  pantoprazole (PROTONIX) 40 MG tablet TAKE 1 TABLET DAILY 12/21/21   [name], [name], MD  rosuvastatin (CRESTOR) 10 MG tablet Take 2 tablets (20 mg total) by mouth daily. 02/18/21 02/13/22  Buford Dresser, MD  Semaglutide,0.25 or 0.5MG /DOS, (OZEMPIC, 0.25 OR 0.5 MG/DOSE,) 2 MG/1.5ML SOPN Inject 0.5 mg into the skin once a week. Patient not taking: Reported on 12/12/2021 11/06/21   Jinny Sanders, MD    Family History Family History  Adopted: Yes  Problem Relation Age of Onset   Allergic rhinitis Mother    Diabetes Mother    Sinusitis Mother    Breast cancer Sister    Hypertension Sister    Colon cancer Neg Hx    Heart attack Neg Hx    Stroke Neg Hx     Social History Social History   Tobacco Use   Smoking status: Never    Passive exposure: Never   Smokeless tobacco: Never  Vaping Use   Vaping Use: Never used  Substance Use Topics   Alcohol use: Yes    Alcohol/week: 0.0 standard drinks of alcohol    Comment: occ   Drug use: No     Allergies   Lactose intolerance (gi), Nickel, and Penicillins   Review of Systems Review of Systems  Constitutional:  Positive for activity change and fatigue. Negative for appetite change and fever.  HENT:  Positive for congestion. Negative for sinus pressure, sneezing and sore throat.   Respiratory:  Positive for cough and shortness of breath.   Cardiovascular:  Negative for chest pain.  Gastrointestinal:  Negative for abdominal pain, diarrhea, nausea and vomiting.     Physical Exam Triage Vital Signs ED Triage Vitals  Enc Vitals Group     BP 03/06/22 1616 (!) 142/87     Pulse Rate 03/06/22 1616 89     Resp 03/06/22 1616 12     Temp 03/06/22 1616 98 F (36.7 C)     Temp src --      SpO2 03/06/22 1616 91 %  Weight 03/06/22 1613 275 lb (124.7 kg)     Height 03/06/22 1613 5'  9" (1.753 m)     Head Circumference --      Peak Flow --      Pain Score 03/06/22 1612 5     Pain Loc --      Pain Edu? --      Excl. in [place]? --    No data found.  Updated Vital Signs BP (!) 142/87 (BP Location: Left Arm)   Pulse 89   Temp 98 F (36.7 C)   Resp 12   Ht $R'5\' 9"'zV$  (1.753 m)   Wt 275 lb (124.7 kg)   SpO2 97%   BMI 40.61 kg/m   Visual Acuity Right Eye Distance:   Left Eye Distance:   Bilateral Distance:    Right Eye Near:   Left Eye Near:    Bilateral Near:     Physical Exam Vitals reviewed.  Constitutional:      General: She is awake. She is not in acute distress.    Appearance: Normal appearance. She is well-developed. She is not ill-appearing.     Comments: Very pleasant female appears stated age in no acute distress sitting comfortably in exam room  HENT:     Head: Normocephalic and atraumatic.     Right Ear: Tympanic membrane, ear canal and external ear normal. Tympanic membrane is not erythematous or bulging.     Left Ear: Tympanic membrane, ear canal and external ear normal. Tympanic membrane is not erythematous or bulging.     Nose:     Right Sinus: No maxillary sinus tenderness or frontal sinus tenderness.     Left Sinus: No maxillary sinus tenderness or frontal sinus tenderness.     Mouth/Throat:     Pharynx: Uvula midline. Posterior oropharyngeal erythema present. No oropharyngeal exudate.  Cardiovascular:     Rate and Rhythm: Normal rate and regular rhythm.     Heart sounds: Normal heart sounds, S1 normal and S2 normal. No murmur heard. Pulmonary:     Effort: Pulmonary effort is normal.     Breath sounds: Normal breath sounds. No wheezing, rhonchi or rales.     Comments: Clear to auscultation bilaterally Chest:     Chest wall: Tenderness present. No deformity or swelling.     Comments: Tenderness palpation over left inferior/lateral rib cage; pain is reproducible on exam. Psychiatric:        Behavior: Behavior is cooperative.      UC  Treatments / Results  Labs (all labs ordered are listed, but only abnormal results are displayed) Labs Reviewed  RESP PANEL BY RT-PCR (RSV, FLU A&B, COVID)  [id number]    EKG   Radiology DG Chest 2 View  Result Date: 03/06/2022 CLINICAL DATA:  Cough and shortness of breath. EXAM: CHEST - 2 VIEW COMPARISON:  Chest x-ray 02/03/2018 FINDINGS: The heart size and mediastinal contours are within normal limits. Both lungs are clear. The visualized skeletal structures are unremarkable. IMPRESSION: No active cardiopulmonary disease. Electronically Signed   By: Ronney Asters M.D.   On: 03/06/2022 16:58    Procedures Procedures (including critical care time)  Medications Ordered in UC Medications - No data to display  Initial Impression / Assessment and Plan / UC Course  I have reviewed the triage vital signs and the nursing notes.  Pertinent labs & imaging results that were available during my care of the patient were reviewed by me and considered in my medical decision making (  see chart for details).     Patient is well-appearing, afebrile, nontoxic, nontachycardic.  Chest x-ray was obtained that showed no acute cardiopulmonary disease.  No evidence of acute infection on physical exam that warrant initiation of antibiotics.  Discussed likely viral etiology of symptoms.  Flu/COVID/RSV testing was obtained-results pending.  We will contact her if these are positive and we recommended she monitor her MyChart for these results.  Given her history she is a candidate for antiviral therapy.  Unfortunately, we would need to discontinue several of her cardiovascular medicines due to potential interactions so would recommend molnupiravir over Paxlovid if she is positive for COVID-19.  She was given albuterol inhaler for shortness of breath and cough symptoms.  Recommended she use Mucinex, Flonase, Tylenol for symptom relief.  She was given Promethazine DM for cough with instruction not to drive or drink alcohol  while taking this medication as drowsiness is a common side effect.  If her symptoms are not improving by next week she is to return for reevaluation.  If she has any worsening symptoms including shortness of breath, fever, worsening cough, nausea, vomiting, weakness she needs to be seen immediately.  Strict return precautions given.  Work excuse note provided.  Final Clinical Impressions(s) / UC Diagnoses   Final diagnoses:  Viral URI with cough     Discharge Instructions      Your x-ray was normal.  I believe that you have a virus.  We will contact you if you are positive for flu, RSV, COVID.  Monitor your MyChart for these results.  Use Mucinex, Flonase, Tylenol for symptom relief.  Use an albuterol inhaler every 4-6 hours as needed for cough and shortness of breath.  Take Promethazine DM up to twice a day as needed for cough.  This will make you sleepy so do not drive or drink alcohol with taking it.  If you have any worsening symptoms including increased cough, shortness of breath, fever, nausea, vomiting, weakness you need to go to the emergency room.     ED Prescriptions     Medication Sig Dispense Auth. Provider   albuterol (VENTOLIN HFA) 108 (90 Base) MCG/ACT inhaler INHALE 2 PUFFS BY MOUTH EVERY 6 HOURS AS NEEDED FOR WHEEZING OR  SHORTNESS  OF  BREATH 18 g Falcon Mccaskey K, PA-C   promethazine-dextromethorphan (PROMETHAZINE-DM) 6.25-15 MG/5ML syrup Take 5 mLs by mouth 2 (two) times daily as needed for cough. 118 mL Mattthew Ziomek K, PA-C      PDMP not reviewed this encounter.   Terrilee Croak, PA-C 03/06/22 1723

## 2022-03-07 ENCOUNTER — Telehealth (HOSPITAL_COMMUNITY): Payer: Self-pay | Admitting: Physician Assistant

## 2022-03-07 MED ORDER — NIRMATRELVIR/RITONAVIR (PAXLOVID)TABLET: 3.0000 | ORAL_TABLET | Freq: Two times a day (BID) | ORAL | 0 refills | Status: AC

## 2022-03-07 NOTE — Telephone Encounter (Signed)
Patient tested positive for COVID-19.  She is a candidate for antiviral therapy.  Paxlovid was sent to her pharmacy.  MyChart message attached to her COVID test result was sent with additional instructions.

## 2022-03-09 ENCOUNTER — Telehealth: Payer: Self-pay | Admitting: Family Medicine

## 2022-03-09 NOTE — Telephone Encounter (Signed)
Spoke with patient this AM, patient did hear from after hours provider, and has no further questions.  PLEASE NOTE: All timestamps contained within this report are represented as [place] Standard Time. CONFIDENTIALTY NOTICE: This fax transmission is intended only for the addressee. It contains information that is legally privileged, confidential or otherwise protected from use or disclosure. If you are not the intended recipient, you are strictly prohibited from reviewing, disclosing, copying using or disseminating any of this information or taking any action in reliance on or regarding this information. If you have received this fax in error, please notify us immediately by telephone so that we can arrange for its return to [place]. Phone: [phone], Toll-Free: [phone], Fax: [phone] Page: 1 of 2 Call Id: [id number] [place] RECORD [facility] Patient Name: Christine Barrett Gender: Female DOB: 1960/12/18 Age: 61 Y 23 M 17 D Return Phone Number: [phone] (Primary) Address: City/ State/ Zip: [place]  [address] Client [place] Night - Client Client Site [place] Provider Eliezer Lofts - MD Contact Type Call Who Is Calling Patient / Member / Family / Caregiver Call Type Triage / Clinical Relationship To Patient Self Return Phone Number [phone] (Primary) Chief Complaint Headache Reason for Call Symptomatic / Request for Health Information Initial Comment Caller has sinus headache, drainage, coughing, sneezing. She was seen UC yesterday and tested Covid + but was not instructed about Rx or treatments. Translation No Nurse Assessment Nurse: Burnadette Peter, RN, Elmo Putt Date/Time (Eastern Time): 03/07/2022 2:56:21 PM Confirm and document reason for call. If symptomatic, describe symptoms. ---Caller has sinus headache, drainage, coughing, sneezing. Caller states  she was seen UC yesterday and tested Covid + but was not instructed about Rx or treatments. Caller denies any worsening symptoms since being seen in the UC. Caller denies any worsening SOB, chest tightness, or fever at this time. Does the patient have any new or worsening symptoms? ---No Guidelines Guideline Title Affirmed Question Affirmed Notes Nurse Date/Time ([place] Time) COVID-19 - Diagnosed or Suspected COVID-19 Disease, questions about Wynona Canes 08/02/5359 4:43:15 PM Disp. Time Eilene Ghazi Time) Disposition Final User 03/07/2022 2:49:05 PM Attempt made - message left Wynona Canes 4/0/0867 6:19:50 [place], RN, Elmo Putt Final Disposition 03/07/2022 3:01:25 PM Home Care Yes Burnadette Peter, RN, Elmo Putt PLEASE NOTE: All timestamps contained within this report are represented as [place] Standard Time. CONFIDENTIALTY NOTICE: This fax transmission is intended only for the addressee. It contains information that is legally privileged, confidential or otherwise protected from use or disclosure. If you are not the intended recipient, you are strictly prohibited from reviewing, disclosing, copying using or disseminating any of this information or taking any action in reliance on or regarding this information. If you have received this fax in error, please notify us immediately by telephone so that we can arrange for its return to [place]. Phone: [phone], Toll-Free: [phone], Fax: [phone] Page: 2 of 2 Call Id: [id number] [place] Disagree/Comply Comply Caller Understands Yes PreDisposition Home Care Care Advice Given Per Guideline COVID-19 - HOW TO PROTECT OTHERS - WHEN YOU ARE SICK WITH COVID-19: * STAY HOME A MINIMUM OF 5 DAYS: People with MILD COVID-19 can STOP HOME ISOLATION AFTER 5 DAYS if (1) fever has been gone for 24 hours (without using fever medicine) AND (2) symptoms are better. Continue to wear a well-fitted mask for a full 10 days when around others. *  WEAR A MASK FOR  10 DAYS: Wear a well-fitted mask for 10 full days any time you are around others inside your home or in public. Do not go to places where you are unable to wear a mask. * [place] HANDS OFTEN: Wash hands often with soap and water. After coughing or sneezing are important times. If soap and water are not available, use an alcohol-based hand sanitizer with at least 60% alcohol, covering all surfaces of your hands and rubbing them together until they feel dry. Avoid touching your eyes, nose, and mouth with unwashed hands. CALL BACK IF: * You have more questions CARE ADVICE given per COVID-19 - DIAGNOSED OR SUSPECTED (Adult) guideline. After Care Instructions Given Call Event Type User Date / Time Description Education document email Wynona Canes 01/05/1504 6:97:94 PM COVID-19 Diagnosed or Suspected

## 2022-03-15 DIAGNOSIS — U071 COVID-19: Secondary | ICD-10-CM | POA: Diagnosis not present

## 2022-03-15 DIAGNOSIS — Z03818 Encounter for observation for suspected exposure to other biological agents ruled out: Secondary | ICD-10-CM | POA: Diagnosis not present

## 2022-03-16 ENCOUNTER — Other Ambulatory Visit (INDEPENDENT_AMBULATORY_CARE_PROVIDER_SITE_OTHER): Payer: BC Managed Care – PPO

## 2022-03-16 ENCOUNTER — Telehealth (INDEPENDENT_AMBULATORY_CARE_PROVIDER_SITE_OTHER): Payer: BC Managed Care – PPO | Admitting: Family Medicine

## 2022-03-16 DIAGNOSIS — E1159 Type 2 diabetes mellitus with other circulatory complications: Secondary | ICD-10-CM

## 2022-03-16 DIAGNOSIS — D649 Anemia, unspecified: Secondary | ICD-10-CM | POA: Diagnosis not present

## 2022-03-16 LAB — CBC WITH DIFFERENTIAL/PLATELET
Basophils Absolute: 0.1 10*3/uL (ref 0.0–0.1)
Basophils Relative: 0.8 % (ref 0.0–3.0)
Eosinophils Absolute: 0.3 10*3/uL (ref 0.0–0.7)
Eosinophils Relative: 5.3 % — ABNORMAL HIGH (ref 0.0–5.0)
HCT: 38.1 % (ref 36.0–46.0)
Hemoglobin: 12.1 g/dL (ref 12.0–15.0)
Lymphocytes Relative: 37.4 % (ref 12.0–46.0)
Lymphs Abs: 2.4 10*3/uL (ref 0.7–4.0)
MCHC: 31.8 g/dL (ref 30.0–36.0)
MCV: 84.4 fl (ref 78.0–100.0)
Monocytes Absolute: 0.7 10*3/uL (ref 0.1–1.0)
Monocytes Relative: 10.1 % (ref 3.0–12.0)
Neutro Abs: 3 10*3/uL (ref 1.4–7.7)
Neutrophils Relative %: 46.4 % (ref 43.0–77.0)
Platelets: 222 10*3/uL (ref 150.0–400.0)
RBC: 4.52 Mil/uL (ref 3.87–5.11)
RDW: 14.1 % (ref 11.5–15.5)
WBC: 6.4 10*3/uL (ref 4.0–10.5)

## 2022-03-16 LAB — COMPREHENSIVE METABOLIC PANEL
ALT: 17 U/L (ref 0–35)
AST: 11 U/L (ref 0–37)
Albumin: 3.9 g/dL (ref 3.5–5.2)
Alkaline Phosphatase: 65 U/L (ref 39–117)
BUN: 16 mg/dL (ref 6–23)
CO2: 27 mEq/L (ref 19–32)
Calcium: 9.2 mg/dL (ref 8.4–10.5)
Chloride: 104 mEq/L (ref 96–112)
Creatinine, Ser: 0.81 mg/dL (ref 0.40–1.20)
GFR: 78.91 mL/min (ref >60.00)
Glucose, Bld: 166 mg/dL — ABNORMAL HIGH (ref 70–99)
Potassium: 4.1 mEq/L (ref 3.5–5.1)
Sodium: 140 mEq/L (ref 135–145)
Total Bilirubin: 0.7 mg/dL (ref 0.2–1.2)
Total Protein: 6.9 g/dL (ref 6.0–8.3)

## 2022-03-16 LAB — LDL CHOLESTEROL, DIRECT: Direct LDL: 158 mg/dL

## 2022-03-16 LAB — LIPID PANEL
Cholesterol: 215 mg/dL — ABNORMAL HIGH (ref 0–200)
HDL: 43.1 mg/dL (ref >39.00)
NonHDL: 171.95
Total CHOL/HDL Ratio: 5
Triglycerides: 209 mg/dL — ABNORMAL HIGH (ref 0.0–149.0)
VLDL: 41.8 mg/dL — ABNORMAL HIGH (ref 0.0–40.0)

## 2022-03-16 LAB — HEMOGLOBIN A1C: Hgb A1c MFr Bld: 7.4 % — ABNORMAL HIGH (ref 4.6–6.5)

## 2022-03-16 NOTE — Telephone Encounter (Signed)
-----   Message from Ellamae Sia sent at 03/16/2022  7:45 AM EDT ----- Regarding: lab orders now Patient is scheduled for CPX labs, please order future labs, Thanks , Terri

## 2022-03-16 NOTE — Progress Notes (Signed)
No critical labs need to be addressed urgently. We will discuss labs in detail at upcoming office visit.   

## 2022-03-17 ENCOUNTER — Encounter: Payer: Self-pay | Admitting: Family Medicine

## 2022-03-17 ENCOUNTER — Telehealth: Payer: Self-pay | Admitting: Family Medicine

## 2022-03-17 NOTE — Telephone Encounter (Signed)
Patient called in stating she tested positive for Covid on Sept. 8th. She is travelling this weekend and wants to know if it will be ok to get a Covid booster before going even though she was positive a few weeks ago. Please advise. Thank you!

## 2022-03-17 NOTE — Telephone Encounter (Signed)
No she should wait minimum of 30 days before booster. She should be protected for 90 days though... no need for concern.  The infection was her booster!

## 2022-03-18 NOTE — Telephone Encounter (Signed)
Ms. Christine Barrett notified as instructed by telephone.  Patient states understanding.

## 2022-03-20 ENCOUNTER — Ambulatory Visit: Payer: BC Managed Care – PPO | Admitting: Family Medicine

## 2022-04-07 ENCOUNTER — Ambulatory Visit: Payer: BC Managed Care – PPO | Admitting: Family Medicine

## 2022-04-07 ENCOUNTER — Encounter: Payer: Self-pay | Admitting: Family Medicine

## 2022-04-07 DIAGNOSIS — I152 Hypertension secondary to endocrine disorders: Secondary | ICD-10-CM | POA: Diagnosis not present

## 2022-04-07 DIAGNOSIS — E1159 Type 2 diabetes mellitus with other circulatory complications: Secondary | ICD-10-CM | POA: Diagnosis not present

## 2022-04-07 DIAGNOSIS — E785 Hyperlipidemia, unspecified: Secondary | ICD-10-CM | POA: Diagnosis not present

## 2022-04-07 MED ORDER — ROSUVASTATIN CALCIUM 20 MG PO TABS: 20.0000 mg | ORAL_TABLET | Freq: Every day | ORAL | 3 refills | Status: DC

## 2022-04-07 NOTE — Progress Notes (Signed)
Patient ID: Christine Barrett, female    DOB: 1960/08/30, 61 y.o.   MRN: [id number]  This visit was conducted in person.  BP 130/82   Pulse 91   Temp 98 F (36.7 C) (Oral)   Ht _0  (1.753 m)   Wt 285 lb 4 oz (129.4 kg)   SpO2 98%   BMI 42.12 kg/m    CC:  Chief Complaint  Patient presents with   Diabetes    Subjective:   HPI: Christine Barrett is a 61 y.o. female presenting on 04/07/2022 for Diabetes  Diabetes:   SE to ozempic and metformin. Lab Results  Component Value Date   HGBA1C 7.4 (H) 03/16/2022  Using medications without difficulties: Hypoglycemic episodes: Hyperglycemic episodes: Feet problems: Blood Sugars averaging: Fbs 120-150 eye exam within last year:    Elevated Cholesterol: Hx of CAD  On Crestor 20 mg daily and zetia 10 mg daily.. no longer well controlled  Frequently missing dosing. Lab Results  Component Value Date   CHOL 215 (H) 03/16/2022   HDL 43.10 03/16/2022   LDLCALC 37 09/12/2021   LDLDIRECT 158.0 03/16/2022   TRIG 209.0 (H) 03/16/2022   CHOLHDL 5 03/16/2022  Using medications without problems: Muscle aches:  Diet compliance: Exercise:limited given low back pain Other complaints:   HTN well controlled on current regimen  Relevant past medical, surgical, family and social history reviewed and updated as indicated. Interim medical history since our last visit reviewed. Allergies and medications reviewed and updated. Outpatient Medications Prior to Visit  Medication Sig Dispense Refill   albuterol (VENTOLIN HFA) 108 (90 Base) MCG/ACT inhaler INHALE 2 PUFFS BY MOUTH EVERY 6 HOURS AS NEEDED FOR WHEEZING OR  SHORTNESS  OF  BREATH 18 g 0   amLODipine (NORVASC) 5 MG tablet Take 1 tablet (5 mg total) by mouth daily. 180 tablet 3   aspirin EC 81 MG EC tablet Take 1 tablet (81 mg total) by mouth daily.     BLACK ELDERBERRY PO Take 2 each by mouth daily.     Blood Glucose Monitoring Suppl (ONETOUCH VERIO) w/Device KIT Use to check blood sugar 2  times a day 1 kit 0   carvedilol (COREG) 25 MG tablet TAKE 1 TABLET BY MOUTH TWICE DAILY WITH A MEAL 180 tablet 3   clotrimazole (LOTRIMIN) 1 % cream Apply 1 application topically 2 (two) times daily. 30 g 0   famotidine (PEPCID) 20 MG tablet Take 1 tablet (20 mg total) by mouth daily. 90 tablet 3   fluticasone (FLONASE) 50 MCG/ACT nasal spray Place 2 sprays into both nostrils daily as needed for allergies or rhinitis.     glucose blood (ONETOUCH VERIO) test strip Use to check blood sugar 2 times a day 100 each 5   hydrochlorothiazide (HYDRODIURIL) 25 MG tablet Take 1 tablet (25 mg total) by mouth daily. 90 tablet 3   isosorbide mononitrate (IMDUR) 60 MG 24 hr tablet Take 2 tablets (120 mg total) by mouth daily. 180 tablet 3   Lancet Devices (ONE TOUCH DELICA LANCING DEV) MISC Use to check blood sugar 2 times a day 1 each 0   latanoprost (XALATAN) 0.005 % ophthalmic solution Place 1 drop into both eyes at bedtime.      levocetirizine (XYZAL) 5 MG tablet Take 5 mg by mouth daily.     losartan (COZAAR) 100 MG tablet Take 1 tablet (100 mg total) by mouth daily. 90 tablet 3   meloxicam (MOBIC) 15 MG tablet Take 1 tablet (  15 mg total) by mouth daily. 30 tablet 3   montelukast (SINGULAIR) 10 MG tablet Take 1 tablet (10 mg total) by mouth at bedtime. 90 tablet 3   Multiple Vitamin (MULTIVITAMIN WITH MINERALS) TABS tablet Take 1 tablet by mouth daily.     Multiple Vitamins-Minerals (EMERGEN-C IMMUNE) PACK Take 1 packet by mouth daily.     ondansetron (ZOFRAN) 4 MG tablet Take 1 tablet (4 mg total) by mouth every 8 (eight) hours as needed for nausea or vomiting. 20 tablet 0   OneTouch Delica Lancets 16X MISC Use to check blood sugar 2 times a day 100 each 5   pantoprazole (PROTONIX) 40 MG tablet TAKE 1 TABLET DAILY 90 tablet 3   rosuvastatin (CRESTOR) 10 MG tablet Take 2 tablets (20 mg total) by mouth daily. 180 tablet 3   promethazine-dextromethorphan (PROMETHAZINE-DM) 6.25-15 MG/5ML syrup Take 5 mLs by  mouth 2 (two) times daily as needed for cough. 118 mL 0   Semaglutide,0.25 or 0.5MG/DOS, (OZEMPIC, 0.25 OR 0.5 MG/DOSE,) 2 MG/1.5ML SOPN Inject 0.5 mg into the skin once a week. (Patient not taking: Reported on 12/12/2021) 2 mL 0   No facility-administered medications prior to visit.     Per HPI unless specifically indicated in ROS section below Review of Systems  Constitutional:  Negative for fatigue and fever.  HENT:  Negative for congestion.   Eyes:  Negative for pain.  Respiratory:  Negative for cough and shortness of breath.   Cardiovascular:  Negative for chest pain, palpitations and leg swelling.  Gastrointestinal:  Negative for abdominal pain.  Genitourinary:  Negative for dysuria and vaginal bleeding.  Musculoskeletal:  Negative for back pain.  Neurological:  Negative for syncope, light-headedness and headaches.  Psychiatric/Behavioral:  Negative for dysphoric mood.    Objective:  BP 130/82   Pulse 91   Temp 98 F (36.7 C) (Oral)   Ht _0  (1.753 m)   Wt 285 lb 4 oz (129.4 kg)   SpO2 98%   BMI 42.12 kg/m   Wt Readings from Last 3 Encounters:  04/07/22 285 lb 4 oz (129.4 kg)  03/06/22 275 lb (124.7 kg)  12/12/21 264 lb 7 oz (119.9 kg)      Physical Exam Constitutional:      General: She is not in acute distress.    Appearance: Normal appearance. She is well-developed. She is not ill-appearing or toxic-appearing.  HENT:     Head: Normocephalic.     Right Ear: Hearing, tympanic membrane, ear canal and external ear normal. Tympanic membrane is not erythematous, retracted or bulging.     Left Ear: Hearing, tympanic membrane, ear canal and external ear normal. Tympanic membrane is not erythematous, retracted or bulging.     Nose: No mucosal edema or rhinorrhea.     Right Sinus: No maxillary sinus tenderness or frontal sinus tenderness.     Left Sinus: No maxillary sinus tenderness or frontal sinus tenderness.     Mouth/Throat:     Pharynx: Uvula midline.  Eyes:      General: Lids are normal. Lids are everted, no foreign bodies appreciated.     Conjunctiva/sclera: Conjunctivae normal.     Pupils: Pupils are equal, round, and reactive to light.  Neck:     Thyroid: No thyroid mass or thyromegaly.     Vascular: No carotid bruit.     Trachea: Trachea normal.  Cardiovascular:     Rate and Rhythm: Normal rate and regular rhythm.     Pulses: Normal  pulses.     Heart sounds: Normal heart sounds, S1 normal and S2 normal. No murmur heard.    No friction rub. No gallop.  Pulmonary:     Effort: Pulmonary effort is normal. No tachypnea or respiratory distress.     Breath sounds: Normal breath sounds. No decreased breath sounds, wheezing, rhonchi or rales.  Abdominal:     General: Bowel sounds are normal.     Palpations: Abdomen is soft.     Tenderness: There is no abdominal tenderness.  Musculoskeletal:     Cervical back: Normal range of motion and neck supple.  Skin:    General: Skin is warm and dry.     Findings: No rash.  Neurological:     Mental Status: She is alert.  Psychiatric:        Mood and Affect: Mood is not anxious or depressed.        Speech: Speech normal.        Behavior: Behavior normal. Behavior is cooperative.        Thought Content: Thought content normal.        Judgment: Judgment normal.       Results for orders placed or performed in visit on 03/16/22  CBC with Differential/Platelet  Result Value Ref Range   WBC 6.4 4.0 - 10.5 K/uL   RBC 4.52 3.87 - 5.11 Mil/uL   Hemoglobin 12.1 12.0 - 15.0 g/dL   HCT 38.1 36.0 - 46.0 %   MCV 84.4 78.0 - 100.0 fl   MCHC 31.8 30.0 - 36.0 g/dL   RDW 14.1 11.5 - 15.5 %   Platelets 222.0 150.0 - 400.0 K/uL   Neutrophils Relative % 46.4 43.0 - 77.0 %   Lymphocytes Relative 37.4 12.0 - 46.0 %   Monocytes Relative 10.1 3.0 - 12.0 %   Eosinophils Relative 5.3 (H) 0.0 - 5.0 %   Basophils Relative 0.8 0.0 - 3.0 %   Neutro Abs 3.0 1.4 - 7.7 K/uL   Lymphs Abs 2.4 0.7 - 4.0 K/uL   Monocytes  Absolute 0.7 0.1 - 1.0 K/uL   Eosinophils Absolute 0.3 0.0 - 0.7 K/uL   Basophils Absolute 0.1 0.0 - 0.1 K/uL     COVID 19 screen:  No recent travel or known exposure to COVID19 The patient denies respiratory symptoms of COVID 19 at this time. The importance of social distancing was discussed today.   Assessment and Plan Problem List Items Addressed This Visit     Hyperlipidemia with target LDL less than 70 (Chronic)    Chronic, significant recent worsening despite Crestor 10 mg twice daily and Zetia 10 mg daily.  She states that she has frequently forgotten to take the Crestor second dose and will try to be more consistent with compliance and taking this medication.  She will also continue to focus on low-cholesterol diet.      Relevant Medications   rosuvastatin (CRESTOR) 20 MG tablet   Hypertension associated with diabetes (HCC)    Chronic, well controlled Continue amlodipine 5 mg daily, Coreg 25 mg p.o. twice daily, high chlorothiazide 25 mg daily and losartan 100 mg p.o. daily      Relevant Medications   rosuvastatin (CRESTOR) 20 MG tablet   Morbid obesity (HCC)    Encouraged exercise, weight loss, healthy eating habits.       Type 2 diabetes mellitus with other circulatory complications, HTN, CVD (HCC)    Chronic, slightly worsening control with A1c now greater than 7. She did  have side effects to Ozempic and metformin.  She states there is significant room for improvement in her lifestyle. We will have her work on 3 more months of lifestyle changes to see if she can improve her A1c back down less than 7.  If over the next month to 3 months if she has noticed increasing fasting blood sugar she will call for consideration of additional medication.  We did discuss the possibility of a trial of Mounjaro versus a trial of Glucotrol versus Farxiga in office today.  Of note GLP-1's would be of the biggest benefit to her given her cardiac history, but she did not tolerate Ozempic  well.      Relevant Medications   rosuvastatin (CRESTOR) 20 MG tablet    Meds ordered this encounter  Medications   rosuvastatin (CRESTOR) 20 MG tablet    Sig: Take 1 tablet (20 mg total) by mouth daily.    Dispense:  90 tablet    Refill:  3   We will reevaluate with labs in 3 months Orders Placed This Encounter  Procedures   Hemoglobin A1c    Standing Status:   Future    Standing Expiration Date:   07/06/2022   Lipid panel    Standing Status:   Future    Standing Expiration Date:   07/06/2022   Comprehensive metabolic panel    Standing Status:   Future    Standing Expiration Date:   07/06/2022        Eliezer Lofts, MD

## 2022-04-07 NOTE — Assessment & Plan Note (Signed)
Encouraged exercise, weight loss, healthy eating habits. ? ?

## 2022-04-07 NOTE — Assessment & Plan Note (Signed)
Chronic, slightly worsening control with A1c now greater than 7. She did have side effects to Ozempic and metformin.  She states there is significant room for improvement in her lifestyle. We will have her work on 3 more months of lifestyle changes to see if she can improve her A1c back down less than 7.  If over the next month to 3 months if she has noticed increasing fasting blood sugar she will call for consideration of additional medication.  We did discuss the possibility of a trial of Mounjaro versus a trial of Glucotrol versus Farxiga in office today.  Of note GLP-1's would be of the biggest benefit to her given her cardiac history, but she did not tolerate Ozempic well.

## 2022-04-07 NOTE — Assessment & Plan Note (Signed)
Chronic, well controlled Continue amlodipine 5 mg daily, Coreg 25 mg p.o. twice daily, high chlorothiazide 25 mg daily and losartan 100 mg p.o. daily

## 2022-04-07 NOTE — Patient Instructions (Addendum)
Get back on track with lifestyle changes.... low impact exercise.  Start low back stretching.  Try to be more consistent with cholesterol medication.

## 2022-04-07 NOTE — Assessment & Plan Note (Signed)
Chronic, significant recent worsening despite Crestor 10 mg twice daily and Zetia 10 mg daily.  She states that she has frequently forgotten to take the Crestor second dose and will try to be more consistent with compliance and taking this medication.  She will also continue to focus on low-cholesterol diet.

## 2022-04-08 IMAGING — DX DG FINGER MIDDLE 2+V*L*
3 series · 3 of 3 positions shown · non-contrast
Comparison: None.

CLINICAL DATA: Pain and swelling

EXAM:
LEFT MIDDLE FINGER 2+V

[finger pa]
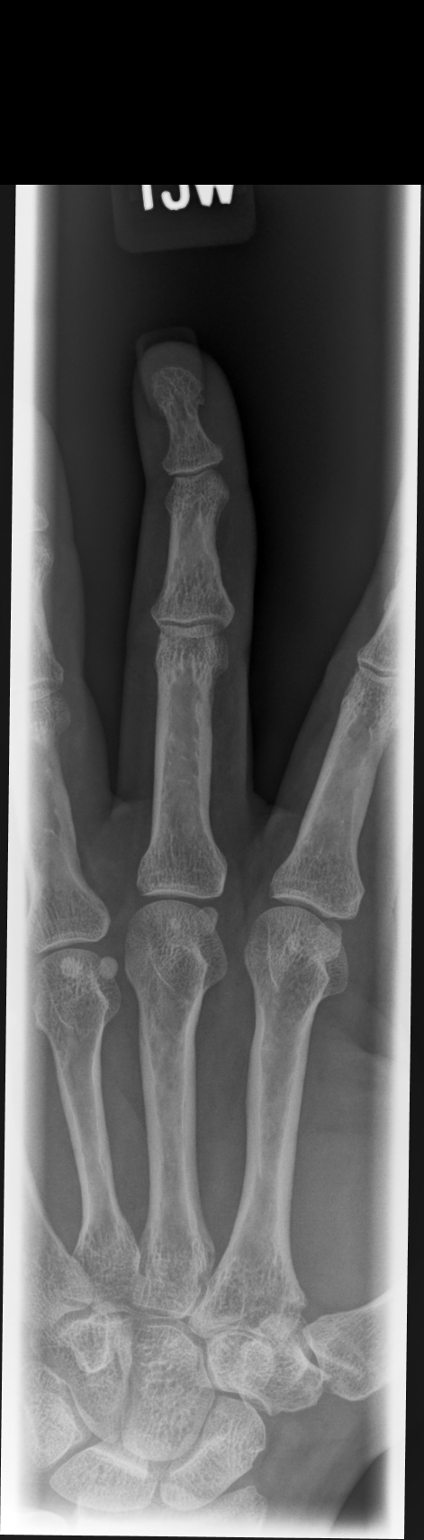

[finger mlo]
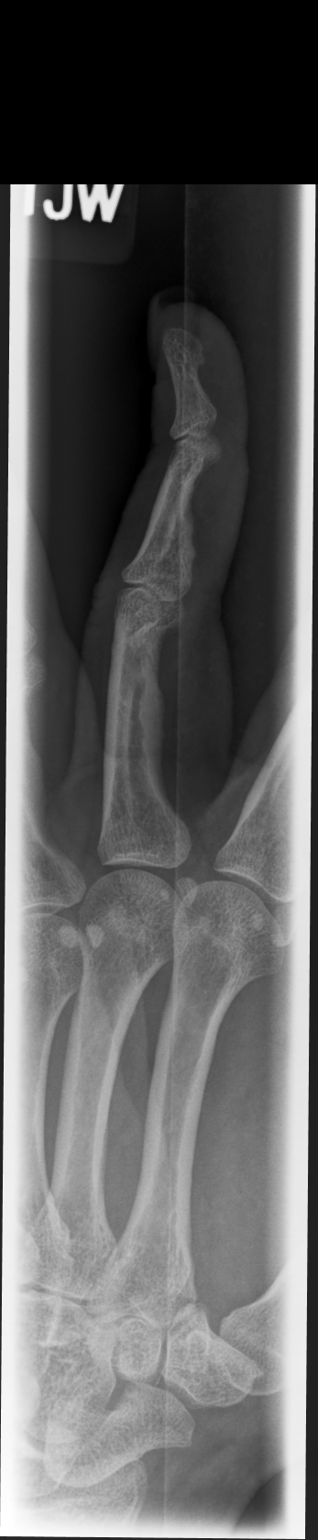

[finger lat]
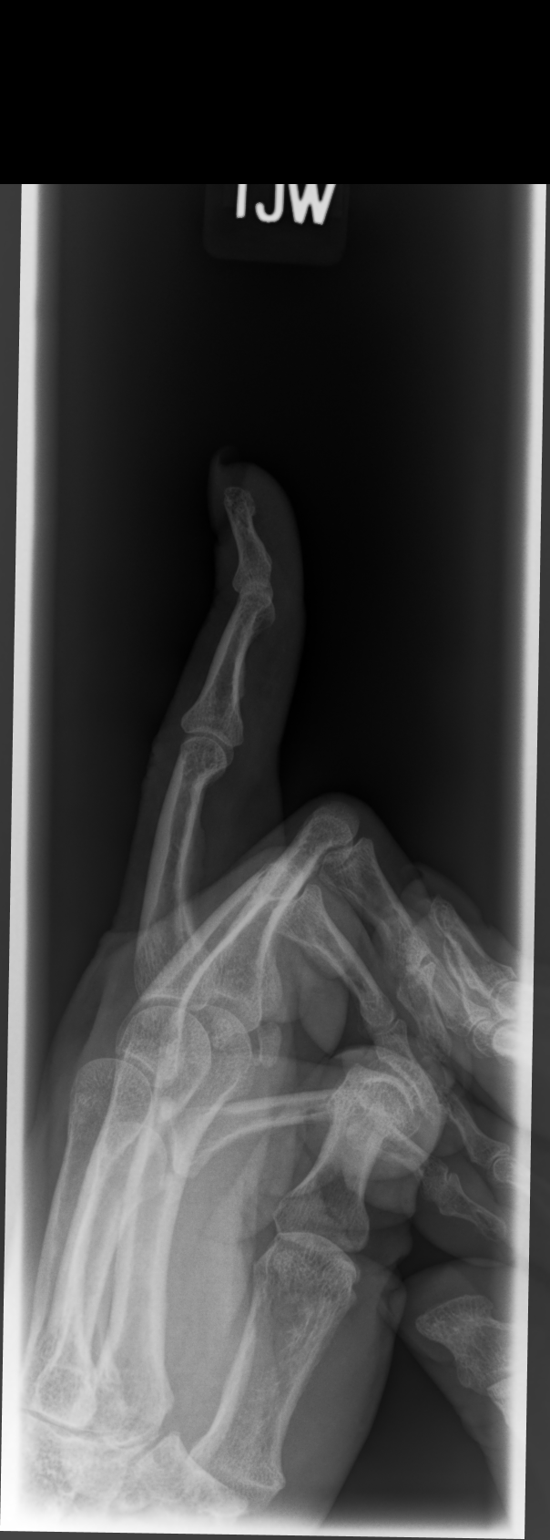

[3 of 3 positions shown; findings below may reference images not displayed]

FINDINGS: No fracture or dislocation is seen. There is mild extension in the
distal interphalangeal joint. Soft tissues are unremarkable.
IMPRESSION: No fracture or dislocation is seen. There is mild extension in the
distal interphalangeal joint which may be due to positioning or
suggest tendon or ligament injury.

## 2022-05-02 ENCOUNTER — Other Ambulatory Visit: Payer: Self-pay | Admitting: Family Medicine

## 2022-05-04 ENCOUNTER — Other Ambulatory Visit: Payer: Self-pay | Admitting: *Deleted

## 2022-05-04 MED ORDER — ROSUVASTATIN CALCIUM 20 MG PO TABS: 20.0000 mg | ORAL_TABLET | Freq: Every day | ORAL | 3 refills | Status: DC

## 2022-05-04 MED ORDER — FAMOTIDINE 20 MG PO TABS: 20.0000 mg | ORAL_TABLET | Freq: Every day | ORAL | 3 refills | Status: DC

## 2022-05-13 ENCOUNTER — Other Ambulatory Visit: Payer: Self-pay | Admitting: Family Medicine

## 2022-06-05 ENCOUNTER — Ambulatory Visit: Payer: BC Managed Care – PPO | Admitting: Orthopedic Surgery

## 2022-06-05 ENCOUNTER — Ambulatory Visit (INDEPENDENT_AMBULATORY_CARE_PROVIDER_SITE_OTHER): Payer: BC Managed Care – PPO

## 2022-06-05 DIAGNOSIS — M25562 Pain in left knee: Secondary | ICD-10-CM

## 2022-06-05 DIAGNOSIS — M25462 Effusion, left knee: Secondary | ICD-10-CM | POA: Diagnosis not present

## 2022-06-06 ENCOUNTER — Encounter: Payer: Self-pay | Admitting: Orthopedic Surgery

## 2022-06-06 MED ORDER — LIDOCAINE HCL 1 % IJ SOLN
5.0000 mL | INTRAMUSCULAR | Status: AC | PRN
  Administered 2022-06-05: 5 mL

## 2022-06-06 MED ORDER — METHYLPREDNISOLONE ACETATE 40 MG/ML IJ SUSP
40.0000 mg | INTRAMUSCULAR | Status: AC | PRN
  Administered 2022-06-05: 40 mg via INTRA_ARTICULAR

## 2022-06-06 MED ORDER — BUPIVACAINE HCL 0.25 % IJ SOLN
4.0000 mL | INTRAMUSCULAR | Status: AC | PRN
  Administered 2022-06-05: 4 mL via INTRA_ARTICULAR

## 2022-06-06 NOTE — Progress Notes (Signed)
Office Visit Note   Patient: Christine Barrett           Date of Birth: [birth year]           MRN: [id number] Visit Date: 06/05/2022 Requested by: Jinny Sanders, MD [place],  [place] [address] PCP: Jinny Sanders, MD  Subjective: Chief Complaint  Patient presents with   Left Knee - Pain    HPI: Christine Barrett is a 61 y.o. female who presents to the office reporting left knee pain.  Been going on for 6 weeks.  Denies any history of injury..  She describes inability to fully weight-bear.  Describes discrete lateral pain with swelling weakness giving way.  Has a long history of left knee problems but no prior left knee surgery.  She has tried ice heat Advil and Tylenol along with a home exercise program of range of motion and strengthening.  She is seeing a chiropractor for left hip and low back pain.  She describes popping in the lateral aspect of the knee when she gets up.  The knee swells at times as well.  Hard for her to stand for longer than 2 to 3 minutes without significant pain.  This is a new finding compared to the way her knee was prior to 6 weeks ago.              ROS: All systems reviewed are negative as they relate to the chief complaint within the history of present illness.  Patient denies fevers or chills.  Assessment & Plan: Visit Diagnoses:  1. Left knee pain, unspecified chronicity     Plan: Impression is left knee pain with fairly unremarkable plain radiographs.  Aspiration and injection of the knee is performed today.  MRI scan of the left knee is indicated for likely lateral meniscal tear with 3-week return for follow-up.  Follow-Up Instructions: No follow-ups on file.   Orders:  Orders Placed This Encounter  Procedures   XR KNEE 3 VIEW LEFT   No orders of the defined types were placed in this encounter.     Procedures: Large Joint Inj: L knee on 06/05/2022 6:21 AM Indications: diagnostic evaluation, joint swelling and pain Details: 18 G 1.5 in  needle, superolateral approach  Arthrogram: No  Medications: 5 mL lidocaine 1 %; 40 mg methylPREDNISolone acetate 40 MG/ML; 4 mL bupivacaine 0.25 % Outcome: tolerated well, no immediate complications Procedure, treatment alternatives, risks and benefits explained, specific risks discussed. Consent was given by the patient. Immediately prior to procedure a time out was called to verify the correct patient, procedure, equipment, support staff and site/side marked as required. Patient was prepped and draped in the usual sterile fashion.       Clinical Data: No additional findings.  Objective: Vital Signs: There were no vitals taken for this visit.  Physical Exam:  Constitutional: Patient appears well-developed HEENT:  Head: Normocephalic Eyes:EOM are normal Neck: Normal range of motion Cardiovascular: Normal rate Pulmonary/chest: Effort normal Neurologic: Patient is alert Skin: Skin is warm Psychiatric: Patient has normal mood and affect  Ortho Exam: Ortho exam demonstrates slightly antalgic gait to the left with positive lateral joint line tenderness in the left knee.  Effusion is present.  Collateral crucial ligaments are stable.  Mild patellofemoral crepitus is present bilaterally.  Range of motion is 0-1 20.  Collateral and cruciate ligaments are stable.  Specialty Comments:  No specialty comments available.  Imaging: XR KNEE 3 VIEW LEFT  Result  Date: 06/06/2022 AP lateral merchant radiographs left knee reviewed.  Mild degenerative changes noted in the medial lateral patellofemoral compartments.  Overall alignment normal.  Most significant degenerative changes noted in the lateral compartment with mild spurring noted.  No bone-on-bone changes apparent on radiographic review    PMFS History: Patient Active Problem List   Diagnosis Date Noted   Coronary artery disease involving native heart with angina pectoris, unspecified vessel or lesion type ([place]) 10/29/2021   Perineal  irritation in female 08/15/2021   Dysuria 08/15/2021   Plantar fasciitis of right foot 07/04/2021   Trochanteric bursitis of left hip 04/08/2021   Pain of left middle finger 04/08/2021   Other allergic rhinitis 01/01/2021   Frequent episodes of sinusitis 01/01/2021   History of bronchitis 01/01/2021   Allergic conjunctivitis of both eyes 01/01/2021   Family history of breast cancer in sister 09/20/2020   Type 2 diabetes mellitus with other circulatory complications, HTN, CVD ([place]) 05/17/2020   Morbid obesity ([place]) 05/17/2020   BMI 40.0-44.9, adult ([place]) 05/17/2020   Acute non-recurrent maxillary sinusitis 11/04/2016   Acute pain of both knees 08/28/2016   OSA (obstructive sleep apnea) 06/10/2016   Costochondritis 06/09/2016   Bilateral leg cramps 02/07/2016   Peripheral edema 01/29/2016   Hypertension associated with diabetes ([place])    GERD (gastroesophageal reflux disease) 08/10/2014   Menopausal syndrome 08/10/2014   CAD S/P percutaneous coronary angioplasty - Ostial AVG Cx - Scoring balloon PTCA 10/03/2013   Hyperlipidemia with target LDL less than 70 10/02/2013   H/O non-ST elevation myocardial infarction (NSTEMI) 10/02/2013   Normocytic anemia, not due to blood loss    Allergic sinusitis 03/17/2007   MIGRAINE, COMMON W/O INTRACTABLE MIGRAINE 03/16/2007   Past Medical History:  Diagnosis Date   Allergy    SEASONAL   Anemia    CAD S/P percutaneous coronary angioplasty 09/2013   a) Ostial AV G Cx - 2.5 mm Angiosculpt; mid LAD 40-60%; b) Myoview 07/2014: LOW RISK, small-severe fixed inferior defect c/w infarct w/o peri-infarct ischemia.   Chronic bronchitis ([place])    "frequently; not q yr" (10/03/2013)   Diabetes mellitus without complication (HCC)    Type 2-diet controlled.    Diverticulosis    Essential hypertension    with prior Accelerated HTN   GERD (gastroesophageal reflux disease)    Hiatal hernia    Hx of non-ST elevation myocardial infarction (NSTEMI) 10/01/2013   Due  to Accelerated HTN with existing CAD   Hyperlipemia    Migraine    "@ least once/month" (10/03/2013)   OSA (obstructive sleep apnea) 06/10/2016   Schatzki's ring    Seasonal allergies    Sinusitis    Spinal headache    during C-section of second child.     Family History  Adopted: Yes  Problem Relation Age of Onset   Allergic rhinitis Mother    Diabetes Mother    Sinusitis Mother    Breast cancer Sister    Hypertension Sister    Colon cancer Neg Hx    Heart attack Neg Hx    Stroke Neg Hx     Past Surgical History:  Procedure Laterality Date   ABDOMINAL HYSTERECTOMY  1994   "partial"   CAPSULAR RELEASE Right 06/06/2020   Procedure: RIGHT SHOULDER MANIPULATION UNDER ANESTHESIA, ROTATOR CUFF REPAIR;  Surgeon: Meredith Pel, MD;  Location: [facility];  Service: Orthopedics;  Laterality: Right;   CARDIAC CATHETERIZATION N/A 08/05/2015   Procedure: Left Heart Cath and Coronary Angiography;  Surgeon:  [name], MD;  Location: [facility];  Service: Cardiovascular;  Laterality: N/A;   CESAREAN SECTION  1989   COLONOSCOPY     CORONARY ANGIOPLASTY  10/03/2013   95% ostial AV G Cx - 2.5 mm AngioSculpt Balloon PTCA; mid LAD 40-60%   LEFT HEART CATHETERIZATION WITH CORONARY ANGIOGRAM N/A 10/02/2013   Procedure: LEFT HEART CATHETERIZATION WITH CORONARY ANGIOGRAM;  Surgeon: [name], MD;  Location: [facility];  Service: Cardiovascular;  Laterality: N/A;   NASAL SEPTOPLASTY W/ TURBINOPLASTY  ~ 2007   NM MYOVIEW LTD  08/22/2014    Low risk stress nuclear study with a small, severe, fixed defect in the distal inferior wall/apex suggestive of small prior infarct; no ischemia.  LV Wall Motion:  NL LV Function; NL Wall Motion   PERCUTANEOUS CORONARY STENT INTERVENTION (PCI-S) N/A 10/03/2013   cutting balloon angioplasty only no stent.    SINOSCOPY     TRANSTHORACIC ECHOCARDIOGRAM  10/02/2013   EF 55-60%; mild LVH, no RWMA,    TUBAL LIGATION  1989   Social History    Occupational History   Occupation: Research scientist (life sciences)  Tobacco Use   Smoking status: Never    Passive exposure: Never   Smokeless tobacco: Never  Vaping Use   Vaping Use: Never used  Substance and Sexual Activity   Alcohol use: Yes    Alcohol/week: 0.0 standard drinks of alcohol    Comment: occ   Drug use: No   Sexual activity: Yes    Birth control/protection: Surgical

## 2022-06-08 ENCOUNTER — Encounter: Payer: Self-pay | Admitting: Family Medicine

## 2022-06-08 DIAGNOSIS — I10 Essential (primary) hypertension: Secondary | ICD-10-CM

## 2022-06-08 MED ORDER — CARVEDILOL 25 MG PO TABS: ORAL_TABLET | ORAL | 3 refills | Status: DC

## 2022-06-08 MED ORDER — ISOSORBIDE MONONITRATE ER 60 MG PO TB24: 120.0000 mg | ORAL_TABLET | Freq: Every day | ORAL | 3 refills | Status: DC

## 2022-06-08 MED ORDER — EZETIMIBE 10 MG PO TABS: 10.0000 mg | ORAL_TABLET | Freq: Every day | ORAL | 3 refills | Status: DC

## 2022-06-12 ENCOUNTER — Telehealth: Payer: Self-pay | Admitting: Orthopedic Surgery

## 2022-06-12 ENCOUNTER — Other Ambulatory Visit: Payer: Self-pay

## 2022-06-12 DIAGNOSIS — M25562 Pain in left knee: Secondary | ICD-10-CM

## 2022-06-12 NOTE — Telephone Encounter (Signed)
Can you order right knee MRI.  Thanks

## 2022-06-12 NOTE — Telephone Encounter (Signed)
Can you please assist with this? Please expedite order since this was missed.  It should be the LEFT knee and not right.

## 2022-06-12 NOTE — Telephone Encounter (Signed)
Pt called stating Dr [name] was to send referral for right knee MRI a couple of weeks ago. Dont see on pt chart. Please send referral. Pt phone number is [phone].

## 2022-06-15 NOTE — Telephone Encounter (Signed)
Submitted to RTA manually for authorization- pending auth- [employer]TA Manually Submitted

## 2022-06-18 NOTE — Telephone Encounter (Signed)
No p.a required

## 2022-06-19 ENCOUNTER — Encounter: Payer: Self-pay | Admitting: Family Medicine

## 2022-06-19 ENCOUNTER — Ambulatory Visit
Admission: RE | Admit: 2022-06-19 | Discharge: 2022-06-19 | Disposition: A | Discharge status: Home / Self Care | Payer: BC Managed Care – PPO | Source: Ambulatory Visit | Attending: Orthopedic Surgery | Admitting: Orthopedic Surgery

## 2022-06-19 DIAGNOSIS — M25562 Pain in left knee: Secondary | ICD-10-CM

## 2022-06-24 MED ORDER — TIRZEPATIDE 2.5 MG/0.5ML ~~LOC~~ SOAJ: 2.5000 mg | SUBCUTANEOUS | 11 refills | Status: DC

## 2022-06-26 ENCOUNTER — Other Ambulatory Visit (HOSPITAL_COMMUNITY): Payer: Self-pay

## 2022-06-26 ENCOUNTER — Telehealth: Payer: Self-pay

## 2022-06-26 NOTE — Telephone Encounter (Signed)
Patient Advocate Encounter  Prior Authorization for [employer] 2.'5MG'$ /0.5ML pen-injectors  has been approved.    PA# [id number] Effective dates: 06/26/22 through 06/26/23  Approval letter attached to charts

## 2022-06-26 NOTE — Telephone Encounter (Signed)
Pharmacy Patient Advocate Encounter   Received notification that prior authorization for Mounjaro 2.'5MG'$ /0.5ML pen-injectors is required/requested.   PA submitted on 06/26/22 to (ins) [employer] via [email or web address]  EOC: [id number]  Status is pending

## 2022-06-26 NOTE — Telephone Encounter (Signed)
Called patient reviewed all information and repeated back to me. Will call if any questions.  ? ?

## 2022-07-02 ENCOUNTER — Other Ambulatory Visit: Payer: BC Managed Care – PPO

## 2022-07-02 ENCOUNTER — Ambulatory Visit: Payer: BC Managed Care – PPO | Admitting: Orthopedic Surgery

## 2022-07-02 DIAGNOSIS — S83272D Complex tear of lateral meniscus, current injury, left knee, subsequent encounter: Secondary | ICD-10-CM

## 2022-07-02 DIAGNOSIS — S83272A Complex tear of lateral meniscus, current injury, left knee, initial encounter: Secondary | ICD-10-CM | POA: Diagnosis not present

## 2022-07-04 ENCOUNTER — Encounter: Payer: Self-pay | Admitting: Orthopedic Surgery

## 2022-07-04 NOTE — Progress Notes (Signed)
Office Visit Note   Patient: Christine Barrett           Date of Birth: August 28, 1960           MRN: [id number] Visit Date: 07/02/2022 Requested by: Jinny Sanders, MD [place],  [place] [id number] PCP: Jinny Sanders, MD  Subjective: Chief Complaint  Patient presents with   Left Knee - Pain    HPI: Christine Barrett is a 61 y.o. female who presents to the office reporting left knee pain.  Since she was last seen she had an MRI scan.  MRI scan is reviewed with the patient.  It does show anterior horn lateral meniscal tear with some instability.  Overall at least 50% of that anterior horn meniscus is involved.  There is minimal arthritis on the MRI scan in the lateral medial and patellofemoral compartment.  She has no personal or family history of DVT or pulmonary embolism.  Symptoms ongoing for about a year but worse since August.  She has to do a lot of driving and gets in and out of the car which is her most symptomatic activity.  She has had an injection 06/06/2023 which gave her some relief.  That relief has waned..                ROS: All systems reviewed are negative as they relate to the chief complaint within the history of present illness.  Patient denies fevers or chills.  Assessment & Plan: Visit Diagnoses: No diagnosis found.  Plan: Impression is symptomatic suture horn lateral meniscal tear.  Plan is we had a long discussion about operative and nonoperative treatment for this.  Symptoms ongoing for a year.  I think she has really failed conservative measures.  Not too much arthritis in the knee.  Her choices are really to live with this as it is or consider arthroscopic intervention which has a reasonable chance to improve but not normalize her function and pain.  The expected rehabilitative course is discussed.  I think this could delay her need for further surgery if there is not too much arthritis visually in the lateral compartment.  The risk and benefits of the procedure  discussed with the patient include not limited to incomplete pain relief infection persistent pain as well as potential need for surgery for arthritis in the future.  All questions answered  Follow-Up Instructions: No follow-ups on file.   Orders:  No orders of the defined types were placed in this encounter.  No orders of the defined types were placed in this encounter.     Procedures: No procedures performed   Clinical Data: No additional findings.  Objective: Vital Signs: There were no vitals taken for this visit.  Physical Exam:  Constitutional: Patient appears well-developed HEENT:  Head: Normocephalic Eyes:EOM are normal Neck: Normal range of motion Cardiovascular: Normal rate Pulmonary/chest: Effort normal Neurologic: Patient is alert Skin: Skin is warm Psychiatric: Patient has normal mood and affect  Ortho Exam: Ortho exam demonstrates full active and passive range of motion of the knee.  Mild effusion is present.  No groin pain on the left knee with internal and external rotation of the leg.  Pedal pulses palpable.  She has anterolateral joint line tenderness discretely.  McMurray compression testing positive for lateral compartment pathology.  Extensor mechanism nontender and intact  Specialty Comments:  No specialty comments available.  Imaging: No results found.   PMFS History: Patient Active Problem List  Diagnosis Date Noted   Coronary artery disease involving native heart with angina pectoris, unspecified vessel or lesion type ([place]) 10/29/2021   Perineal irritation in female 08/15/2021   Dysuria 08/15/2021   Plantar fasciitis of right foot 07/04/2021   Trochanteric bursitis of left hip 04/08/2021   Pain of left middle finger 04/08/2021   Other allergic rhinitis 01/01/2021   Frequent episodes of sinusitis 01/01/2021   History of bronchitis 01/01/2021   Allergic conjunctivitis of both eyes 01/01/2021   Family history of breast cancer in sister  09/20/2020   Type 2 diabetes mellitus with other circulatory complications, HTN, CVD ([place]) 05/17/2020   Morbid obesity ([place]) 05/17/2020   BMI 40.0-44.9, adult ([place]) 05/17/2020   Acute non-recurrent maxillary sinusitis 11/04/2016   Acute pain of both knees 08/28/2016   OSA (obstructive sleep apnea) 06/10/2016   Costochondritis 06/09/2016   Bilateral leg cramps 02/07/2016   Peripheral edema 01/29/2016   Hypertension associated with diabetes ([place])    GERD (gastroesophageal reflux disease) 08/10/2014   Menopausal syndrome 08/10/2014   CAD S/P percutaneous coronary angioplasty - Ostial AVG Cx - Scoring balloon PTCA 10/03/2013   Hyperlipidemia with target LDL less than 70 10/02/2013   H/O non-ST elevation myocardial infarction (NSTEMI) 10/02/2013   Normocytic anemia, not due to blood loss    Allergic sinusitis 03/17/2007   MIGRAINE, COMMON W/O INTRACTABLE MIGRAINE 03/16/2007   Past Medical History:  Diagnosis Date   Allergy    SEASONAL   Anemia    CAD S/P percutaneous coronary angioplasty 09/2013   a) Ostial AV G Cx - 2.5 mm Angiosculpt; mid LAD 40-60%; b) Myoview 07/2014: LOW RISK, small-severe fixed inferior defect c/w infarct w/o peri-infarct ischemia.   Chronic bronchitis ([place])    "frequently; not q yr" (10/03/2013)   Diabetes mellitus without complication (H[facility])    Type 2-diet controlled.    Diverticulosis    Essential hypertension    with prior Accelerated HTN   GERD (gastroesophageal reflux disease)    Hiatal hernia    Hx of non-ST elevation myocardial infarction (NSTEMI) 10/01/2013   Due to Accelerated HTN with existing CAD   Hyperlipemia    Migraine    "@ least once/month" (10/03/2013)   OSA (obstructive sleep apnea) 06/10/2016   Schatzki's ring    Seasonal allergies    Sinusitis    Spinal headache    during C-section of second child.     Family History  Adopted: Yes  Problem Relation Age of Onset   Allergic rhinitis Mother    Diabetes Mother    Sinusitis Mother     Breast cancer Sister    Hypertension Sister    Colon cancer Neg Hx    Heart attack Neg Hx    Stroke Neg Hx     Past Surgical History:  Procedure Laterality Date   ABDOMINAL HYSTERECTOMY  1994   "partial"   CAPSULAR RELEASE Right 06/06/2020   Procedure: RIGHT SHOULDER MANIPULATION UNDER ANESTHESIA, ROTATOR CUFF REPAIR;  Surgeon: Meredith Pel, MD;  Location: [facility];  Service: Orthopedics;  Laterality: Right;   CARDIAC CATHETERIZATION N/A 08/05/2015   Procedure: Left Heart Cath and Coronary Angiography;  Surgeon: [name], MD;  Location: [facility];  Service: Cardiovascular;  Laterality: N/A;   CESAREAN SECTION  1989   COLONOSCOPY     CORONARY ANGIOPLASTY  10/03/2013   95% ostial AV G Cx - 2.5 mm AngioSculpt Balloon PTCA; mid LAD 40-60%   LEFT HEART CATHETERIZATION WITH CORONARY ANGIOGRAM  N/A 10/02/2013   Procedure: LEFT HEART CATHETERIZATION WITH CORONARY ANGIOGRAM;  Surgeon: [name], MD;  Location: [facility];  Service: Cardiovascular;  Laterality: N/A;   NASAL SEPTOPLASTY W/ TURBINOPLASTY  ~ 2007   NM MYOVIEW LTD  08/22/2014    Low risk stress nuclear study with a small, severe, fixed defect in the distal inferior wall/apex suggestive of small prior infarct; no ischemia.  LV Wall Motion:  NL LV Function; NL Wall Motion   PERCUTANEOUS CORONARY STENT INTERVENTION (PCI-S) N/A 10/03/2013   cutting balloon angioplasty only no stent.    SINOSCOPY     TRANSTHORACIC ECHOCARDIOGRAM  10/02/2013   EF 55-60%; mild LVH, no RWMA,    TUBAL LIGATION  1989   Social History   Occupational History   Occupation: Research scientist (life sciences)  Tobacco Use   Smoking status: Never    Passive exposure: Never   Smokeless tobacco: Never  Vaping Use   Vaping Use: Never used  Substance and Sexual Activity   Alcohol use: Yes    Alcohol/week: 0.0 standard drinks of alcohol    Comment: occ   Drug use: No   Sexual activity: Yes    Birth control/protection: Surgical

## 2022-07-09 ENCOUNTER — Ambulatory Visit: Payer: BC Managed Care – PPO | Admitting: Family Medicine

## 2022-07-16 ENCOUNTER — Encounter: Payer: Self-pay | Admitting: Podiatry

## 2022-07-16 ENCOUNTER — Ambulatory Visit: Payer: BC Managed Care – PPO | Admitting: Podiatry

## 2022-07-16 DIAGNOSIS — M722 Plantar fascial fibromatosis: Secondary | ICD-10-CM | POA: Diagnosis not present

## 2022-07-16 MED ORDER — TRIAMCINOLONE ACETONIDE 40 MG/ML IJ SUSP
20.0000 mg | Freq: Once | INTRAMUSCULAR | Status: AC
  Administered 2022-07-16: 20 mg

## 2022-07-16 NOTE — Progress Notes (Signed)
She presents today states that my foot is hurting and I am about to have knee surgery on the left side but the right side splints bother me.  Objective: Vital signs stable alert oriented x 3 pulses are palpable.  There is no erythema edema salines drainage odor she has pain on palpation MucoClear tubercle right heel.  Assessment: Planter fasciitis right.  Plan: I injected the area today 20 mg Kenalog 5 mg Marcaine point maximal tenderness.

## 2022-07-21 ENCOUNTER — Ambulatory Visit: Payer: BC Managed Care – PPO | Admitting: Podiatry

## 2022-07-27 ENCOUNTER — Telehealth: Payer: Self-pay | Admitting: Family Medicine

## 2022-07-27 DIAGNOSIS — E1159 Type 2 diabetes mellitus with other circulatory complications: Secondary | ICD-10-CM

## 2022-07-27 NOTE — Telephone Encounter (Signed)
-----  Message from Velna Hatchet, RT sent at 07/20/2022 10:07 AM EST ----- Regarding: Mon 2/5 lab Future lab orders for 3 month follow up needed, please.  Thanks, Anda Kraft

## 2022-08-03 ENCOUNTER — Other Ambulatory Visit: Payer: BC Managed Care – PPO

## 2022-08-04 ENCOUNTER — Telehealth (HOSPITAL_BASED_OUTPATIENT_CLINIC_OR_DEPARTMENT_OTHER): Payer: Self-pay | Admitting: Cardiology

## 2022-08-04 ENCOUNTER — Encounter (HOSPITAL_BASED_OUTPATIENT_CLINIC_OR_DEPARTMENT_OTHER): Payer: Self-pay

## 2022-08-04 NOTE — Telephone Encounter (Signed)
   Name: Lashawndra Lampkins  DOB: 09-11-1960  MRN: [id number]  Primary Cardiologist: Buford Dresser, MD  Chart reviewed as part of pre-operative protocol coverage. Because of [place] Liou's past medical history and time since last visit, she will require a follow-up in-office visit in order to better assess preoperative cardiovascular risk.  Pre-op covering staff: - Please schedule appointment and call patient to inform them. If patient already had an upcoming appointment within acceptable timeframe, please add "pre-op clearance" to the appointment notes so provider is aware. - Please contact requesting surgeon's office via preferred method (i.e, phone, fax) to inform them of need for appointment prior to surgery.  Lenna Sciara, NP  08/04/2022, 12:08 PM

## 2022-08-04 NOTE — Telephone Encounter (Signed)
Follow up appointment scheduled tomorrow with Christen Bame, NP; patient agreeable and voiced understanding.

## 2022-08-04 NOTE — Telephone Encounter (Signed)
   Pre-operative Risk Assessment    Patient Name: Christine Barrett  DOB: 10-05-1960 MRN: [id number]      Request for Surgical Clearance    Procedure:   Left Knee arthroscopy   Date of Surgery:  Clearance 08/10/22                                 Surgeon:  Dr. Meredith Pel  Surgeon's Group or Practice Name:  [facility] Phone number:  [phone] Fax number:  [phone]   Type of Clearance Requested:   - Medical  - Pharmacy:  Hold Blood thinners if taking any  TBD by cardiology   Type of Anesthesia:  General    Additional requests/questions:    [name]   08/04/2022, 11:56 AM

## 2022-08-05 ENCOUNTER — Ambulatory Visit: Payer: BC Managed Care – PPO | Attending: Nurse Practitioner | Admitting: Nurse Practitioner

## 2022-08-05 ENCOUNTER — Encounter: Payer: Self-pay | Admitting: Nurse Practitioner

## 2022-08-05 VITALS — BP 130/72 | HR 85 | Ht 69.0 in | Wt 282.0 lb

## 2022-08-05 DIAGNOSIS — Z9861 Coronary angioplasty status: Secondary | ICD-10-CM

## 2022-08-05 DIAGNOSIS — I2510 Atherosclerotic heart disease of native coronary artery without angina pectoris: Secondary | ICD-10-CM

## 2022-08-05 DIAGNOSIS — Z7189 Other specified counseling: Secondary | ICD-10-CM

## 2022-08-05 DIAGNOSIS — Z01818 Encounter for other preprocedural examination: Secondary | ICD-10-CM

## 2022-08-05 DIAGNOSIS — E1159 Type 2 diabetes mellitus with other circulatory complications: Secondary | ICD-10-CM | POA: Diagnosis not present

## 2022-08-05 DIAGNOSIS — E785 Hyperlipidemia, unspecified: Secondary | ICD-10-CM

## 2022-08-05 DIAGNOSIS — I10 Essential (primary) hypertension: Secondary | ICD-10-CM

## 2022-08-05 NOTE — Progress Notes (Signed)
Cardiology Office Note:    Date:  08/05/2022   ID:  Christine Barrett, DOB [birth year], MRN [id number]  PCP:  Jinny Sanders, MD   [facility] Providers Cardiologist:  Buford Dresser, MD     Referring MD: Jinny Sanders, MD   Chief Complaint: preoperative cardiac evaluation  History of Present Illness:    Christine Barrett is a very pleasant 61 y.o. female with a hx of CAD s/p prior PTCA, HTN, OSA, and HLD.  She initially established care with Dr. Harrell Gave 02/12/2020 as a consult from PCP for evaluation and management of hypertension and coronary artery disease.  She reported difficulty monitoring BP at home due to the need for a new cuff.  Also reported episodes of sharp chest pain almost like a burning in her central chest.  It is sore when palpated.  Her most reports an episode required her to lie down until symptoms resolved.  Also having episodes of dizzy spells, seeing white spots which she attributed to sinus issues.  Had also noticed occasional left LE edema localized in her foot, described it as "looking like a grapefruit."  She was advised to start amlodipine 5 mg daily for elevated BP. This was in addition to carvedilol, HCTZ, isosorbide, and losartan.  Symptoms of chest pain felt to be consistent with costochondritis.  With ASCVD and DM, discussion regarding recommendation to start either SGLT2 or GLP-1 agent, plan to discuss at return office visit in 3 months.  She was given a schedule to slowly resume rosuvastatin with goal of 20 mg daily. There is no record of return office visit.   Today, she is here for preoperative cardiac evaluation. She is tentatively scheduled for left knee arthroscopy 08/10/2022 with Dr. [name]. Has a torn meniscus. Had right rotator cuff repair following a fall last year. Reports she is overall feeling well. Notes shortness of breath with walking far distances. No orthopnea, PND. Has swelling around left knee, none in lower legs. Occasional flare ups of chest  discomfort that she feels are triggered by GERD or forgetting to take medications. No additional episodes of chest pain with exertion.  She denies palpitations, presyncope, syncope.   Past Medical History:  Diagnosis Date   Allergy    SEASONAL   Anemia    CAD S/P percutaneous coronary angioplasty 09/2013   a) Ostial AV G Cx - 2.5 mm Angiosculpt; mid LAD 40-60%; b) Myoview 07/2014: LOW RISK, small-severe fixed inferior defect c/w infarct w/o peri-infarct ischemia.   Chronic bronchitis ([place])    "frequently; not q yr" (10/03/2013)   Diabetes mellitus without complication (HCC)    Type 2-diet controlled.    Diverticulosis    Essential hypertension    with prior Accelerated HTN   GERD (gastroesophageal reflux disease)    Hiatal hernia    Hx of non-ST elevation myocardial infarction (NSTEMI) 10/01/2013   Due to Accelerated HTN with existing CAD   Hyperlipemia    Migraine    "@ least once/month" (10/03/2013)   OSA (obstructive sleep apnea) 06/10/2016   Schatzki's ring    Seasonal allergies    Sinusitis    Spinal headache    during C-section of second child.     Past Surgical History:  Procedure Laterality Date   ABDOMINAL HYSTERECTOMY  1994   "partial"   CAPSULAR RELEASE Right 06/06/2020   Procedure: RIGHT SHOULDER MANIPULATION UNDER ANESTHESIA, ROTATOR CUFF REPAIR;  Surgeon: Meredith Pel, MD;  Location: [facility];  Service: Orthopedics;  Laterality: Right;  CARDIAC CATHETERIZATION N/A 08/05/2015   Procedure: Left Heart Cath and Coronary Angiography;  Surgeon: [name], MD;  Location: [facility];  Service: Cardiovascular;  Laterality: N/A;   CESAREAN SECTION  1989   COLONOSCOPY     CORONARY ANGIOPLASTY  10/03/2013   95% ostial AV G Cx - 2.5 mm AngioSculpt Balloon PTCA; mid LAD 40-60%   LEFT HEART CATHETERIZATION WITH CORONARY ANGIOGRAM N/A 10/02/2013   Procedure: LEFT HEART CATHETERIZATION WITH CORONARY ANGIOGRAM;  Surgeon: [name], MD;  Location: [facility];   Service: Cardiovascular;  Laterality: N/A;   NASAL SEPTOPLASTY W/ TURBINOPLASTY  ~ 2007   NM MYOVIEW LTD  08/22/2014    Low risk stress nuclear study with a small, severe, fixed defect in the distal inferior wall/apex suggestive of small prior infarct; no ischemia.  LV Wall Motion:  NL LV Function; NL Wall Motion   PERCUTANEOUS CORONARY STENT INTERVENTION (PCI-S) N/A 10/03/2013   cutting balloon angioplasty only no stent.    SINOSCOPY     TRANSTHORACIC ECHOCARDIOGRAM  10/02/2013   EF 55-60%; mild LVH, no RWMA,    TUBAL LIGATION  1989    Current Medications: Current Meds  Medication Sig   albuterol (VENTOLIN HFA) 108 (90 Base) MCG/ACT inhaler INHALE 2 PUFFS BY MOUTH EVERY 6 HOURS AS NEEDED FOR WHEEZING OR  SHORTNESS  OF  BREATH   amLODipine (NORVASC) 5 MG tablet TAKE 1 TABLET DAILY   aspirin EC 81 MG EC tablet Take 1 tablet (81 mg total) by mouth daily.   BLACK ELDERBERRY PO Take 2 each by mouth daily.   Blood Glucose Monitoring Suppl (ONETOUCH VERIO) w/Device KIT Use to check blood sugar 2 times a day   carvedilol (COREG) 25 MG tablet TAKE 1 TABLET BY MOUTH TWICE DAILY WITH A MEAL   clotrimazole (LOTRIMIN) 1 % cream Apply 1 application topically 2 (two) times daily.   ezetimibe (ZETIA) 10 MG tablet Take 1 tablet (10 mg total) by mouth daily.   famotidine (PEPCID) 20 MG tablet Take 1 tablet (20 mg total) by mouth daily.   fluticasone (FLONASE) 50 MCG/ACT nasal spray Place 2 sprays into both nostrils daily as needed for allergies or rhinitis.   glucose blood (ONETOUCH VERIO) test strip Use to check blood sugar 2 times a day   hydrochlorothiazide (HYDRODIURIL) 25 MG tablet TAKE 1 TABLET DAILY   isosorbide mononitrate (IMDUR) 60 MG 24 hr tablet Take 2 tablets (120 mg total) by mouth daily.   Lancet Devices (ONE TOUCH DELICA LANCING DEV) MISC Use to check blood sugar 2 times a day   latanoprost (XALATAN) 0.005 % ophthalmic solution Place 1 drop into both eyes at bedtime.    levocetirizine  (XYZAL) 5 MG tablet Take 5 mg by mouth daily.   losartan (COZAAR) 100 MG tablet TAKE 1 TABLET DAILY   meloxicam (MOBIC) 15 MG tablet Take 1 tablet (15 mg total) by mouth daily.   montelukast (SINGULAIR) 10 MG tablet Take 1 tablet (10 mg total) by mouth at bedtime.   Multiple Vitamin (MULTIVITAMIN WITH MINERALS) TABS tablet Take 1 tablet by mouth daily.   Multiple Vitamins-Minerals (EMERGEN-C IMMUNE) PACK Take 1 packet by mouth daily.   ondansetron (ZOFRAN) 4 MG tablet Take 1 tablet (4 mg total) by mouth every 8 (eight) hours as needed for nausea or vomiting.   OneTouch Delica Lancets 35K MISC Use to check blood sugar 2 times a day   pantoprazole (PROTONIX) 40 MG tablet TAKE 1 TABLET  DAILY   rosuvastatin (CRESTOR) 20 MG tablet Take 1 tablet (20 mg total) by mouth daily.   tirzepatide [facility]) 2.5 MG/0.5ML Pen Inject 2.5 mg into the skin once a week.     Allergies:   Lactose intolerance (gi), Nickel, and Penicillins   Social History   Socioeconomic History   Marital status: Divorced    Spouse name: Herbie [place]   Number of children: 2   Years of education: Not on file   Highest education level: Not on file  Occupational History   Occupation: Research scientist (life sciences)  Tobacco Use   Smoking status: Never    Passive exposure: Never   Smokeless tobacco: Never  Vaping Use   Vaping Use: Never used  Substance and Sexual Activity   Alcohol use: Yes    Alcohol/week: 0.0 standard drinks of alcohol    Comment: occ   Drug use: No   Sexual activity: Yes    Birth control/protection: Surgical  Other Topics Concern   Not on file  Social History Narrative   She is a married mother of 2, grandmother 2. Her current marriage is than a pack years.    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 She never smoked and does not drink All. She does do time and balancing exercises but does not do routine of exercise.   She herself is adopted.    Social Determinants of Health   Financial Resource Strain: Not on file  Food Insecurity: Not on file  Transportation Needs: Not on file  Physical Activity: Not on file  Stress: Not on file  Social Connections: Not on file     Family History: The patient's family history includes Allergic rhinitis in her mother; Breast cancer in her sister; Diabetes in her mother; Hypertension in her sister; Sinusitis in her mother. There is no history of Colon cancer, Heart attack, or Stroke. She was adopted.  ROS:   Please see the history of present illness.   All other systems reviewed and are negative.  Labs/Other Studies Reviewed:    The following studies were reviewed today:  LHC 08/05/2015 Ost 2nd Diag to 2nd Diag lesion, 80% stenosed. 2 small for PCI Prox LAD to Mid LAD lesion, 60% stenosed. Prox Cx to Mid Cx lesion, 50% stenosed. The lesion was previously treated with angioplasty one to two years ago.    Essentially stable coronary artery disease as compared to last catheterization. The PTCA site does have some restenosis but only 50% and a very focal segment.   Potential culprit for angiogram would be the diagonal lesion that is not amenable to PCI. If the patient were to have worsening symptoms, would recommend noninvasive stress testing to evaluate the LAD which appears to be stable from 2015. FFR and LV gram not performed due to concern for radial/brachial spasm.   Plan: 3 Standard post radial cath care with TR band removal  Discharge after bedrest and TR band removal Increase Imdur dose to 120 mg Consider adding diuretic    Diagnostic Dominance: Right  Intervention   Recent Labs: 03/16/2022: ALT 17; BUN 16; Creatinine, Ser 0.81; Hemoglobin 12.1; Platelets 222.0; Potassium 4.1; Sodium 140  Recent Lipid Panel    Component Value Date/Time   CHOL 215 (H) 03/16/2022 1154   TRIG 209.0 (H) 03/16/2022 1154   HDL 43.10 03/16/2022 1154   CHOLHDL 5 03/16/2022 1154   VLDL 41.8 (H)  03/16/2022 1154  LDLCALC 37 09/12/2021 0801   LDLCALC 151 (H) 05/08/2019 0824   LDLDIRECT 158.0 03/16/2022 1154     Risk Assessment/Calculations:      Physical Exam:    VS:  BP 130/72   Pulse 85   Ht '5\' 9"'$  (1.753 m)   Wt 282 lb (127.9 kg)   SpO2 93%   BMI 41.64 kg/m     Wt Readings from Last 3 Encounters:  08/05/22 282 lb (127.9 kg)  04/07/22 285 lb 4 oz (129.4 kg)  03/06/22 275 lb (124.7 kg)     GEN: Obese, well developed in no acute distress HEENT: Normal NECK: No JVD; No carotid bruits CARDIAC: RRR, no murmurs, rubs, gallops RESPIRATORY:  Clear to auscultation without rales, wheezing or rhonchi  ABDOMEN: Soft, non-tender, non-distended MUSCULOSKELETAL:  No edema; No deformity. 2+ pedal pulses, equal bilaterally SKIN: Warm and dry NEUROLOGIC:  Alert and oriented x 3 PSYCHIATRIC:  Normal affect   EKG:  EKG is ordered today.  The ekg ordered today demonstrates normal sinus rhythm at 85 bpm, nonspecific ST abnormality, no acute change from previous tracing  Diagnoses:    1. Cardiac risk counseling   2. Pre-op evaluation   3. CAD S/P percutaneous coronary angioplasty   4. Type 2 diabetes mellitus with other circulatory complications, HTN, CVD ([place])   5. Essential hypertension   6. Hyperlipidemia LDL goal <70    Assessment and Plan:     Preoperative cardiac evaluation: The patient is doing well from a cardiac perspective. Therefore, based on ACC/AHA guidelines, the patient would be at acceptable risk for the planned procedure without further cardiovascular testing. According to the Revised Cardiac Risk Index (RCRI), her Perioperative Risk of Major Cardiac Event is (%): 0.9. Her Functional Capacity in METs is: 7.59 according to the Duke Activity Status Index (DASI). She may hold  aspirin x 1 week prior to procedure and should resume as soon as hemodynamically stable.  Will fax clearance to Dr. Randel Pigg office.  CAD without angina: Last cath 07/2015 with Ost 2nd diag to  2nd diag 80% stenosed, too small for PCI, prox Lad to mid LAD lesion 60% stenosed, prox Cx to mid Cx lesion 50% stenosed, lesion previously treated with angioplasty 1 to 2 years prior.  Stable coronary artery disease as compared to last cath.  PTCA site has restenosis but only 50% and very focal segment.  Cardiac risk counseling: Admits that she needs to take action to take better care of herself. Encouraged mostly plant-based eating for management of diabetes and heart disease. Encouraged 150 minutes moderate intensity exercise each week as well as weight loss for overall reduction in CV risk.  Advised her to contact us if she would like referral to PREP after recovering from knee surgery.   Type 2 DM: A1C 7.4 on 03/16/22. Encouraged diet and exercise as noted above. Admits she needs to work on better diet and increasing exercise. Did not tolerate Ozempic. Is now on Mounjaro. Management per PCP.   Hypertension: BP is well controlled   Hyperlipidemia LDL goal < 55: Elevated triglycerides 209 and LDL 158 on 03/16/22. Was advised to return for repeat labs with PCP on increased medical therapy.  Emphasized the importance of LDL < 70, ideally < 55.     Disposition: 1 year with Dr. Harrell Gave  Medication Adjustments/Labs and Tests Ordered: Current medicines are reviewed at length with the patient today.  Concerns regarding medicines are outlined above.  Orders Placed This Encounter  Procedures  EKG 12-Lead   No orders of the defined types were placed in this encounter.   Patient Instructions  Medication Instructions:   Your physician recommends that you continue on your current medications as directed. Please refer to the Current Medication list given to you today.  You can hold Asprin 7 days prior to surgery.   *If you need a refill on your cardiac medications before your next appointment, please call your pharmacy*   Lab Work:  None ordered.  If you have labs (blood work) drawn today  and your tests are completely normal, you will receive your results only by: [place] (if you have MyChart) OR A paper copy in the mail If you have any lab test that is abnormal or we need to change your treatment, we will call you to review the results.   Testing/Procedures:  None ordered.   Follow-Up: At [facility], you and your health needs are our priority.  As part of our continuing mission to provide you with exceptional heart care, we have created designated Provider Care Teams.  These Care Teams include your primary Cardiologist (physician) and Advanced Practice Providers (APPs -  Physician Assistants and Nurse Practitioners) who all work together to provide you with the care you need, when you need it.  We recommend signing up for the patient portal called "MyChart".  Sign up information is provided on this After Visit Summary.  MyChart is used to connect with patients for Virtual Visits (Telemedicine).  Patients are able to view lab/test results, encounter notes, upcoming appointments, etc.  Non-urgent messages can be sent to your provider as well.   To learn more about what you can do with MyChart, go to [email or web address].    Your next appointment:   1 year(s)  Provider:   Buford Dresser, MD    Other Instructions  Your physician wants you to follow-up in: 1 year with Dr. Harrell Gave.  You will receive a reminder letter in the mail two months in advance. If you don't receive a letter, please call our office to schedule the follow-up appointment.     Signed, Emmaline Life, NP  08/05/2022 4:34 PM    [place]

## 2022-08-05 NOTE — Patient Instructions (Signed)
Medication Instructions:   Your physician recommends that you continue on your current medications as directed. Please refer to the Current Medication list given to you today.  You can hold Asprin 7 days prior to surgery.   *If you need a refill on your cardiac medications before your next appointment, please call your pharmacy*   Lab Work:  None ordered.  If you have labs (blood work) drawn today and your tests are completely normal, you will receive your results only by: [place] (if you have MyChart) OR A paper copy in the mail If you have any lab test that is abnormal or we need to change your treatment, we will call you to review the results.   Testing/Procedures:  None ordered.   Follow-Up: At [facility], you and your health needs are our priority.  As part of our continuing mission to provide you with exceptional heart care, we have created designated Provider Care Teams.  These Care Teams include your primary Cardiologist (physician) and Advanced Practice Providers (APPs -  Physician Assistants and Nurse Practitioners) who all work together to provide you with the care you need, when you need it.  We recommend signing up for the patient portal called "MyChart".  Sign up information is provided on this After Visit Summary.  MyChart is used to connect with patients for Virtual Visits (Telemedicine).  Patients are able to view lab/test results, encounter notes, upcoming appointments, etc.  Non-urgent messages can be sent to your provider as well.   To learn more about what you can do with MyChart, go to [email or web address].    Your next appointment:   1 year(s)  Provider:   Buford Dresser, MD    Other Instructions  Your physician wants you to follow-up in: 1 year with Dr. Harrell Gave.  You will receive a reminder letter in the mail two months in advance. If you don't receive a letter, please call our office to schedule the follow-up  appointment.

## 2022-08-10 ENCOUNTER — Encounter: Payer: Self-pay | Admitting: Orthopedic Surgery

## 2022-08-10 ENCOUNTER — Other Ambulatory Visit: Payer: Self-pay | Admitting: Surgical

## 2022-08-10 ENCOUNTER — Telehealth: Payer: Self-pay | Admitting: Orthopedic Surgery

## 2022-08-10 DIAGNOSIS — G8918 Other acute postprocedural pain: Secondary | ICD-10-CM | POA: Diagnosis not present

## 2022-08-10 DIAGNOSIS — S83272A Complex tear of lateral meniscus, current injury, left knee, initial encounter: Secondary | ICD-10-CM | POA: Diagnosis not present

## 2022-08-10 DIAGNOSIS — M94262 Chondromalacia, left knee: Secondary | ICD-10-CM | POA: Diagnosis not present

## 2022-08-10 DIAGNOSIS — X58XXXA Exposure to other specified factors, initial encounter: Secondary | ICD-10-CM | POA: Diagnosis not present

## 2022-08-10 DIAGNOSIS — Y999 Unspecified external cause status: Secondary | ICD-10-CM | POA: Diagnosis not present

## 2022-08-10 MED ORDER — ASPIRIN 81 MG PO CHEW: 81.0000 mg | CHEWABLE_TABLET | Freq: Two times a day (BID) | ORAL | 0 refills | Status: DC

## 2022-08-10 MED ORDER — METHOCARBAMOL 500 MG PO TABS: 500.0000 mg | ORAL_TABLET | Freq: Three times a day (TID) | ORAL | 1 refills | Status: DC | PRN

## 2022-08-10 MED ORDER — OXYCODONE-ACETAMINOPHEN 5-325 MG PO TABS: 1.0000 | ORAL_TABLET | ORAL | 0 refills | Status: DC | PRN

## 2022-08-10 MED ORDER — ONDANSETRON HCL 4 MG PO TABS: 4.0000 mg | ORAL_TABLET | Freq: Three times a day (TID) | ORAL | 0 refills | Status: DC | PRN

## 2022-08-10 NOTE — Telephone Encounter (Signed)
called

## 2022-08-10 NOTE — Telephone Encounter (Signed)
Patient called asked if Dr [name] would call her because she need clarification on what was done during surgery,Weight bearing and patient need a raise toilet seat and where to get one.  The number to contact patient is [phone]

## 2022-08-11 ENCOUNTER — Other Ambulatory Visit: Payer: Self-pay

## 2022-08-11 ENCOUNTER — Telehealth: Payer: Self-pay | Admitting: Orthopedic Surgery

## 2022-08-11 ENCOUNTER — Ambulatory Visit: Payer: BC Managed Care – PPO | Admitting: Family Medicine

## 2022-08-11 ENCOUNTER — Other Ambulatory Visit: Payer: Self-pay | Admitting: Surgical

## 2022-08-11 DIAGNOSIS — M25462 Effusion, left knee: Secondary | ICD-10-CM

## 2022-08-11 DIAGNOSIS — S83272D Complex tear of lateral meniscus, current injury, left knee, subsequent encounter: Secondary | ICD-10-CM

## 2022-08-11 DIAGNOSIS — M25562 Pain in left knee: Secondary | ICD-10-CM

## 2022-08-11 MED ORDER — HYDROMORPHONE HCL 2 MG PO TABS: 2.0000 mg | ORAL_TABLET | ORAL | 0 refills | Status: DC | PRN

## 2022-08-11 NOTE — Telephone Encounter (Signed)
Pt called again asking for a call back concerning her scripts for medical supplies of wheelchair, crutches, and toilet seat. Pt states she called medical supply company and has questions . Please call pt at [phone].

## 2022-08-11 NOTE — Telephone Encounter (Signed)
Pt called requesting a call back concerning scripts for a raised toilet seat and crutches. Pt also states the pain medication is still not touching her pain and need something stronger. Please send to pharmacy on file [place]. Please call pt about this matter at [phone]. Pt asked for this to be sent as urgent message.

## 2022-08-11 NOTE — Telephone Encounter (Signed)
See other note. This has been addressed. 

## 2022-08-11 NOTE — Telephone Encounter (Signed)
Sent in RX for Dilaudid to see if this is more helpful.  Do not take with the other pain RX oxycodone

## 2022-08-11 NOTE — Telephone Encounter (Signed)
Tried calling patient to discuss request for DME, no answer, went to VM

## 2022-08-11 NOTE — Telephone Encounter (Signed)
IC discussed walker vs crutches with patient. Christine Barrett had at length conversation with patient as well regarding this. Also discussed WTB status as well. Advised patient that rx for pain medication had also been sent to pharmacy.  Faxed script for DME to [facility] [phone] per patients request

## 2022-08-11 NOTE — Telephone Encounter (Signed)
See other note waiting to be advised by [facility] on pain meds.

## 2022-08-11 NOTE — Telephone Encounter (Signed)
Patient states she received a call but no message left. She also states she is in a lot of pain and requesting a different type of pain medication and that the walker she got isn't helping. Please advise.[name]

## 2022-08-13 ENCOUNTER — Telehealth: Payer: Self-pay

## 2022-08-13 ENCOUNTER — Encounter: Payer: Self-pay | Admitting: Orthopedic Surgery

## 2022-08-13 NOTE — Telephone Encounter (Signed)
Christine Barrett with [facility] called stating that she will need progress notes, any other notes that states how a wheelchair will help patient, and a new Rx with patient's DOB, name, and what type of wheelchair needed, (elevating leg rest).  CB# [phone], fax# [phone].  Please advise.  Thank you.

## 2022-08-14 ENCOUNTER — Other Ambulatory Visit: Payer: Self-pay | Admitting: Surgical

## 2022-08-14 ENCOUNTER — Telehealth: Payer: Self-pay | Admitting: Orthopedic Surgery

## 2022-08-14 MED ORDER — HYDROMORPHONE HCL 4 MG PO TABS: 2.0000 mg | ORAL_TABLET | Freq: Four times a day (QID) | ORAL | 0 refills | Status: DC | PRN

## 2022-08-14 NOTE — Telephone Encounter (Signed)
Faxed op note, demographics and called [facility]

## 2022-08-14 NOTE — Telephone Encounter (Signed)
I think a wheelchair with elevated leg should be good.  Can use operative note with request for nwb status postop unless some other document needed

## 2022-08-14 NOTE — Telephone Encounter (Signed)
Patient called in stating the pain medication that was prescribed is not in stock and she has call all surrounding pharmacies and only 1 has it [facility] on [address] but they only have 1 mg and she was prescribed 2 mg please advise and if not she will need a different medication over all

## 2022-08-14 NOTE — Telephone Encounter (Signed)
Sent in RX for 56m tabs to only take half tablet for 225mtotal.  [facility]

## 2022-08-17 ENCOUNTER — Encounter: Payer: Self-pay | Admitting: Orthopedic Surgery

## 2022-08-17 ENCOUNTER — Ambulatory Visit (INDEPENDENT_AMBULATORY_CARE_PROVIDER_SITE_OTHER): Payer: BC Managed Care – PPO | Admitting: Orthopedic Surgery

## 2022-08-17 ENCOUNTER — Ambulatory Visit (HOSPITAL_COMMUNITY)
Admission: RE | Admit: 2022-08-17 | Discharge: 2022-08-17 | Disposition: A | Discharge status: Home / Self Care | Payer: BC Managed Care – PPO | Source: Ambulatory Visit | Attending: Orthopedic Surgery | Admitting: Orthopedic Surgery

## 2022-08-17 DIAGNOSIS — S83272D Complex tear of lateral meniscus, current injury, left knee, subsequent encounter: Secondary | ICD-10-CM | POA: Diagnosis not present

## 2022-08-17 NOTE — Progress Notes (Signed)
Post-Op Visit Note   Patient: Christine Barrett           Date of Birth: [birth year]           MRN: [id number] Visit Date: 08/17/2022 PCP: Jinny Sanders, MD   Assessment & Plan:  Chief Complaint:  Chief Complaint  Patient presents with   Left Knee - Routine Post Op    02/12/224 left knee scope; LMT deb   Visit Diagnoses:  1. Complex tear of lateral meniscus of left knee as current injury, subsequent encounter     Plan: Christine Barrett is a patient presents now a week out left knee arthroscopy with lateral meniscal tear repair.  Taking Dilaudid for pain.  On aspirin for DVT prophylaxis.  Nonweightbearing.  Oxycodone did not help.  On exam she has mild calf tenderness.  Negative Homans but she does have some tenderness to palpation of the calf.  Not much effusion.  Portal incisions intact and the sutures are removed.  Plan is ultrasound rule out DVT left lower extremity.  Ace wrap 3-week return.  Continue nonweightbearing and hold off on motion in that left leg.  When we see her back in 3 weeks we will transition her to partial weightbearing for 2 weeks then full weightbearing thereafter.  Okay to start knee range of motion at that time as well.  Follow-Up Instructions: Return in about 3 weeks (around 09/07/2022).   Orders:  Orders Placed This Encounter  Procedures   VAS [place] LOWER EXTREMITY VENOUS (DVT)   No orders of the defined types were placed in this encounter.   Imaging: No results found.  PMFS History: Patient Active Problem List   Diagnosis Date Noted   Coronary artery disease involving native heart with angina pectoris, unspecified vessel or lesion type ([place]) 10/29/2021   Perineal irritation in female 08/15/2021   Dysuria 08/15/2021   Plantar fasciitis of right foot 07/04/2021   Trochanteric bursitis of left hip 04/08/2021   Pain of left middle finger 04/08/2021   Other allergic rhinitis 01/01/2021   Frequent episodes of sinusitis 01/01/2021   History of bronchitis 01/01/2021    Allergic conjunctivitis of both eyes 01/01/2021   Family history of breast cancer in sister 09/20/2020   Type 2 diabetes mellitus with other circulatory complications, HTN, CVD ([place]) 05/17/2020   Morbid obesity ([place]) 05/17/2020   BMI 40.0-44.9, adult ([place]) 05/17/2020   Acute non-recurrent maxillary sinusitis 11/04/2016   Acute pain of both knees 08/28/2016   OSA (obstructive sleep apnea) 06/10/2016   Costochondritis 06/09/2016   Bilateral leg cramps 02/07/2016   Peripheral edema 01/29/2016   Hypertension associated with diabetes ([place])    GERD (gastroesophageal reflux disease) 08/10/2014   Menopausal syndrome 08/10/2014   CAD S/P percutaneous coronary angioplasty - Ostial AVG Cx - Scoring balloon PTCA 10/03/2013   Hyperlipidemia with target LDL less than 70 10/02/2013   H/O non-ST elevation myocardial infarction (NSTEMI) 10/02/2013   Normocytic anemia, not due to blood loss    Allergic sinusitis 03/17/2007   MIGRAINE, COMMON W/O INTRACTABLE MIGRAINE 03/16/2007   Past Medical History:  Diagnosis Date   Allergy    SEASONAL   Anemia    CAD S/P percutaneous coronary angioplasty 09/2013   a) Ostial AV G Cx - 2.5 mm Angiosculpt; mid LAD 40-60%; b) Myoview 07/2014: LOW RISK, small-severe fixed inferior defect c/w infarct w/o peri-infarct ischemia.   Chronic bronchitis ([place])    "frequently; not q yr" (10/03/2013)   Diabetes mellitus without complication ([place])  Type 2-diet controlled.    Diverticulosis    Essential hypertension    with prior Accelerated HTN   GERD (gastroesophageal reflux disease)    Hiatal hernia    Hx of non-ST elevation myocardial infarction (NSTEMI) 10/01/2013   Due to Accelerated HTN with existing CAD   Hyperlipemia    Migraine    "@ least once/month" (10/03/2013)   OSA (obstructive sleep apnea) 06/10/2016   Schatzki's ring    Seasonal allergies    Sinusitis    Spinal headache    during C-section of second child.     Family History  Adopted: Yes  Problem  Relation Age of Onset   Allergic rhinitis Mother    Diabetes Mother    Sinusitis Mother    Breast cancer Sister    Hypertension Sister    Colon cancer Neg Hx    Heart attack Neg Hx    Stroke Neg Hx     Past Surgical History:  Procedure Laterality Date   ABDOMINAL HYSTERECTOMY  1994   "partial"   CAPSULAR RELEASE Right 06/06/2020   Procedure: RIGHT SHOULDER MANIPULATION UNDER ANESTHESIA, ROTATOR CUFF REPAIR;  Surgeon: Meredith Pel, MD;  Location: [facility];  Service: Orthopedics;  Laterality: Right;   CARDIAC CATHETERIZATION N/A 08/05/2015   Procedure: Left Heart Cath and Coronary Angiography;  Surgeon: [name], MD;  Location: [facility];  Service: Cardiovascular;  Laterality: N/A;   CESAREAN SECTION  1989   COLONOSCOPY     CORONARY ANGIOPLASTY  10/03/2013   95% ostial AV G Cx - 2.5 mm AngioSculpt Balloon PTCA; mid LAD 40-60%   LEFT HEART CATHETERIZATION WITH CORONARY ANGIOGRAM N/A 10/02/2013   Procedure: LEFT HEART CATHETERIZATION WITH CORONARY ANGIOGRAM;  Surgeon: [name], MD;  Location: [facility] C[facility];  Service: Cardiovascular;  Laterality: N/A;   NASAL SEPTOPLASTY W/ TURBINOPLASTY  ~ 2007   NM MYOVIEW LTD  08/22/2014    Low risk stress nuclear study with a small, severe, fixed defect in the distal inferior wall/apex suggestive of small prior infarct; no ischemia.  LV Wall Motion:  NL LV Function; NL Wall Motion   PERCUTANEOUS CORONARY STENT INTERVENTION (PCI-S) N/A 10/03/2013   cutting balloon angioplasty only no stent.    SINOSCOPY     TRANSTHORACIC ECHOCARDIOGRAM  10/02/2013   EF 55-60%; mild LVH, no RWMA,    TUBAL LIGATION  1989   Social History   Occupational History   Occupation: Research scientist (life sciences)  Tobacco Use   Smoking status: Never    Passive exposure: Never   Smokeless tobacco: Never  Vaping Use   Vaping Use: Never used  Substance and Sexual Activity   Alcohol use: Yes    Alcohol/week: 0.0 standard drinks of alcohol    Comment: occ    Drug use: No   Sexual activity: Yes    Birth control/protection: Surgical

## 2022-08-17 NOTE — Progress Notes (Signed)
A left lower extremity venous exam was completed.  Attempted to call negative results to [phone] at 15:00. Results were relayed to patient and she was sent home.  Jodeen Mclin, RVT

## 2022-08-20 ENCOUNTER — Encounter: Payer: Self-pay | Admitting: Orthopedic Surgery

## 2022-08-20 ENCOUNTER — Encounter: Payer: Self-pay | Admitting: Family Medicine

## 2022-08-21 NOTE — Telephone Encounter (Signed)
Per appt notes pt has appt with Dr [name] on 08/25/22 at 9:40 AM. Also please see pt message to Dr [name] ortho and his comments 08/20/22. Cindy who scheduled appt said that pt only wanted to see Dr [name] (other providers had sooner appts).  Sending note to Dr [name] and [employer] pool and spoke with Butch Penny CMA.

## 2022-08-21 NOTE — Telephone Encounter (Signed)
I spoke with pt; pt has no CP and no SOB. Pt said she has not increased her salt intake. Pt said that she is in a recliner but it does not go back so her legs are higher than pts heart. I advised pt she could try putting pillows under her legs to elevate them more. Pt has read the recommendations from Dr [name] also. Pt said about 1 hr ago BP was 116/69 but recording from electronic BP cuff said irregular heart beat was detected. Pt said she has not felt irregular heart beat. Pt daughter took BP now and BP was 133/78 P 78 and did not mention anything in BP cuff recording that there was any irregular heart beat this time. Pt said her feet are swollen and I offered pt a sooner appt at [facility] and UC & ED precautions were also given and pt voiced understanding. Pt said she would wait on seeing Dr [name] on 08/25/22. Sending note to Dr [name] and [facility] pool.

## 2022-08-21 NOTE — Telephone Encounter (Signed)
Noted  

## 2022-08-21 NOTE — Telephone Encounter (Signed)
[facility] - Client TELEPHONE ADVICE RECORD [facility] Patient Name: Amberly HU NT Gender: Female DOB: Feb 05, 1961 Age: 62 Y 62 M Return Phone Number: [phone] (Primary) Address: City/ State/ Zip: [place]  [address] Client [facility] - Client Client Site [place] - Day Provider Eliezer Lofts - MD Contact Type Call Who Is Calling Patient / Member / Family / Caregiver Call Type Triage / Clinical Relationship To Patient Self Return Phone Number [phone] (Primary) Chief Complaint Numbness Reason for Call Symptomatic / Request for [place] states she has swelling in both of her feet, cold feeling, and toes are numb. Translation No Nurse Assessment Nurse: Selena Batten, RN, Amy Date/Time Eilene Ghazi Time): 08/21/2022 2:46:34 PM Confirm and document reason for call. If symptomatic, describe symptoms. ---Caller states she has had worsening swelling to both of her feet, states it started last week. Caller had surgery for meniscus tear last week, is non weight bearing for 4-6 weeks. Caller had follow up this week with surgeon and it was noted she had swelling and tenderness to the back of her calves, had u/s and did not have blood clots. Caller states since then the swelling has gotten significantly worse. Does the patient have any new or worsening symptoms? ---Yes Will a triage be completed? ---Yes Related visit to physician within the last 2 weeks? ---Yes Does the PT have any chronic conditions? (i.e. diabetes, asthma, this includes High risk factors for pregnancy, etc.) ---Yes List chronic conditions. ---hypertension, diabetes, high cholesterol Is this a behavioral health or substance abuse call? ---No Guidelines Guideline Title Affirmed Question Affirmed Notes Nurse Date/Time Eilene Ghazi Time) Leg Swelling and Edema [1] Thigh, calf, or ankle swelling  AND [2] bilateral AND [3] 1 side is more swollen Selena Batten, RN, Amy 08/21/2022 2:50:11 PM PLEASE NOTE: All timestamps contained within this report are represented as [place] Standard Time. CONFIDENTIALTY NOTICE: This fax transmission is intended only for the addressee. It contains information that is legally privileged, confidential or otherwise protected from use or disclosure. If you are not the intended recipient, you are strictly prohibited from reviewing, disclosing, copying using or disseminating any of this information or taking any action in reliance on or regarding this information. If you have received this fax in error, please notify us immediately by telephone so that we can arrange for its return to [place]. Phone: [phone], Toll-Free: [phone], Fax: [phone] Page: 2 of 2 Call Id: [id number] [place]. Time Eilene Ghazi Time) Disposition Final User 08/21/2022 3:04:12 PM See HCP within 4 Hours (or PCP triage) Yes Selena Batten, RN, Amy Final Disposition 08/21/2022 3:04:12 PM See HCP within 4 Hours (or PCP triage) Yes Selena Batten, RN, Amy Caller Disagree/Comply Comply Caller Understands Yes PreDisposition Did not know what to do Care Advice Given Per Guideline SEE HCP (OR PCP TRIAGE) WITHIN 4 HOURS: * IF OFFICE WILL BE CLOSED AND NO PCP (PRIMARY CARE PROVIDER) SECOND-LEVEL TRIAGE: You need to be seen within the next 3 or 4 hours. A nearby Urgent Care Center [facility]) is often a good source of care. Another choice is to go to the ED. Go sooner if you become worse. CARE ADVICE given per Leg Swelling and Edema (Adult) guideline. Comments User: Clabe Seal, RN Date/Time Eilene Ghazi Time): 08/21/2022 2:51:42 PM both feet cold to the touch User: Clabe Seal, RN Date/Time Eilene Ghazi Time): 08/21/2022 3:02:54 PM contacted backline, no appointments available this afternoon. Referrals GO TO FACILITY UNDECIDE

## 2022-08-25 ENCOUNTER — Encounter: Payer: Self-pay | Admitting: Family Medicine

## 2022-08-25 ENCOUNTER — Ambulatory Visit: Payer: BC Managed Care – PPO | Admitting: Family Medicine

## 2022-08-25 VITALS — BP 112/70 | HR 87 | Temp 97.6°F | Ht 69.0 in

## 2022-08-25 DIAGNOSIS — R609 Edema, unspecified: Secondary | ICD-10-CM

## 2022-08-25 LAB — COMPREHENSIVE METABOLIC PANEL
ALT: 18 U/L (ref 0–35)
AST: 13 U/L (ref 0–37)
Albumin: 4 g/dL (ref 3.5–5.2)
Alkaline Phosphatase: 77 U/L (ref 39–117)
BUN: 16 mg/dL (ref 6–23)
CO2: 30 mEq/L (ref 19–32)
Calcium: 9.9 mg/dL (ref 8.4–10.5)
Chloride: 102 mEq/L (ref 96–112)
Creatinine, Ser: 0.68 mg/dL (ref 0.40–1.20)
GFR: 94.38 mL/min (ref >60.00)
Glucose, Bld: 126 mg/dL — ABNORMAL HIGH (ref 70–99)
Potassium: 3.6 mEq/L (ref 3.5–5.1)
Sodium: 139 mEq/L (ref 135–145)
Total Bilirubin: 0.7 mg/dL (ref 0.2–1.2)
Total Protein: 7.7 g/dL (ref 6.0–8.3)

## 2022-08-25 MED ORDER — TIRZEPATIDE 2.5 MG/0.5ML ~~LOC~~ SOAJ: 2.5000 mg | SUBCUTANEOUS | 11 refills | Status: DC

## 2022-08-25 MED ORDER — FUROSEMIDE 20 MG PO TABS: 20.0000 mg | ORAL_TABLET | Freq: Every day | ORAL | 0 refills | Status: DC

## 2022-08-25 MED ORDER — POTASSIUM CHLORIDE CRYS ER 20 MEQ PO TBCR: 20.0000 meq | EXTENDED_RELEASE_TABLET | Freq: Every day | ORAL | 0 refills | Status: DC

## 2022-08-25 NOTE — Patient Instructions (Addendum)
Please stop at the lab to have labs drawn... will call with results later today.  Elevated legs above heart as much able.  Tomorrow..Hold HCTZ.[name] start furosemide 20 mg daily. Take a potassium tablet with it.  Continue daily for the next 3-4 days.. Call with update.  Follow Blood pressure daily.

## 2022-08-25 NOTE — Progress Notes (Signed)
Patient ID: Christine Barrett, female    DOB: 06/22/1961, 62 y.o.   MRN: [id number]  This visit was conducted in person.  BP 112/70   Pulse 87   Temp 97.6 F (36.4 C) (Temporal)   Ht '5\' 9"'$  (1.753 m)   SpO2 98%   BMI 41.64 kg/m    CC:  Chief Complaint  Patient presents with   Foot Swelling    Subjective:   HPI: Christine Barrett is a 62 y.o. female presenting on 08/25/2022 for Foot Swelling   Left knee arthroscopy for complex lateral meniscal tear of left knee 08/10/2021  Dr. [name]. Reviewed last OV 08/17/2022 with ORTHO.[name] ttp in calf.. sent for [place].[name] No DVT seen on 08/17/2022  Currently non weight bearing.  She is triying to increase   She has noted significant swelling in left foot since the surgery. Left foot is numb.   She has tried treating with heat.. she is fairly immobile since she has to keep knee straight.. some limited elevation.   No CP, No SOB, npo palpitations.   She is on amlodipine 5 mg daily,  carvedilol 25 mg daily, HCTZ 25 mg daily, losartan  100 mg daily and isosorbide. BP Readings from Last 3 Encounters:  08/25/22 112/70  08/05/22 130/72  04/07/22 130/82    Wt Readings from Last 3 Encounters:  08/05/22 282 lb (127.9 kg)  04/07/22 285 lb 4 oz (129.4 kg)  03/06/22 275 lb (124.7 kg)        Relevant past medical, surgical, family and social history reviewed and updated as indicated. Interim medical history since our last visit reviewed. Allergies and medications reviewed and updated. Outpatient Medications Prior to Visit  Medication Sig Dispense Refill   albuterol (VENTOLIN HFA) 108 (90 Base) MCG/ACT inhaler INHALE 2 PUFFS BY MOUTH EVERY 6 HOURS AS NEEDED FOR WHEEZING OR  SHORTNESS  OF  BREATH 18 g 0   amLODipine (NORVASC) 5 MG tablet TAKE 1 TABLET DAILY 180 tablet 3   aspirin (ASPIRIN CHILDRENS) 81 MG chewable tablet Chew 1 tablet (81 mg total) by mouth 2 (two) times daily. To prevent blood clots 60 tablet 0   BLACK ELDERBERRY PO Take 2 each by mouth daily.      Blood Glucose Monitoring Suppl (ONETOUCH VERIO) w/Device KIT Use to check blood sugar 2 times a day 1 kit 0   carvedilol (COREG) 25 MG tablet TAKE 1 TABLET BY MOUTH TWICE DAILY WITH A MEAL 180 tablet 3   clotrimazole (LOTRIMIN) 1 % cream Apply 1 application topically 2 (two) times daily. 30 g 0   ezetimibe (ZETIA) 10 MG tablet Take 1 tablet (10 mg total) by mouth daily. 90 tablet 3   famotidine (PEPCID) 20 MG tablet Take 1 tablet (20 mg total) by mouth daily. 90 tablet 3   fluticasone (FLONASE) 50 MCG/ACT nasal spray Place 2 sprays into both nostrils daily as needed for allergies or rhinitis.     glucose blood (ONETOUCH VERIO) test strip Use to check blood sugar 2 times a day 100 each 5   hydrochlorothiazide (HYDRODIURIL) 25 MG tablet TAKE 1 TABLET DAILY 90 tablet 3   HYDROmorphone (DILAUDID) 4 MG tablet Take 0.5 tablets (2 mg total) by mouth every 6 (six) hours as needed for severe pain. 20 tablet 0   isosorbide mononitrate (IMDUR) 60 MG 24 hr tablet Take 2 tablets (120 mg total) by mouth daily. 180 tablet 3   Lancet Devices (ONE TOUCH DELICA LANCING DEV) MISC Use to  check blood sugar 2 times a day 1 each 0   latanoprost (XALATAN) 0.005 % ophthalmic solution Place 1 drop into both eyes at bedtime.      levocetirizine (XYZAL) 5 MG tablet Take 5 mg by mouth daily.     losartan (COZAAR) 100 MG tablet TAKE 1 TABLET DAILY 90 tablet 3   methocarbamol (ROBAXIN) 500 MG tablet Take 1 tablet (500 mg total) by mouth every 8 (eight) hours as needed for muscle spasms. 30 tablet 1   montelukast (SINGULAIR) 10 MG tablet Take 1 tablet (10 mg total) by mouth at bedtime. 90 tablet 3   Multiple Vitamin (MULTIVITAMIN WITH MINERALS) TABS tablet Take 1 tablet by mouth daily.     Multiple Vitamins-Minerals (EMERGEN-C IMMUNE) PACK Take 1 packet by mouth daily.     ondansetron (ZOFRAN) 4 MG tablet Take 1 tablet (4 mg total) by mouth every 8 (eight) hours as needed for nausea or vomiting. 20 tablet 0   OneTouch Delica  Lancets 99991111 MISC Use to check blood sugar 2 times a day 100 each 5   pantoprazole (PROTONIX) 40 MG tablet TAKE 1 TABLET DAILY 90 tablet 3   rosuvastatin (CRESTOR) 20 MG tablet Take 1 tablet (20 mg total) by mouth daily. 90 tablet 3   meloxicam (MOBIC) 15 MG tablet Take 1 tablet (15 mg total) by mouth daily. 30 tablet 3   tirzepatide (MOUNJARO) 2.5 MG/0.5ML Pen Inject 2.5 mg into the skin once a week. 2 mL 11   No facility-administered medications prior to visit.     Per HPI unless specifically indicated in ROS section below Review of Systems  Constitutional:  Negative for fatigue and fever.  HENT:  Negative for congestion.   Eyes:  Negative for pain.  Respiratory:  Negative for cough and shortness of breath.   Cardiovascular:  Negative for chest pain, palpitations and leg swelling.  Gastrointestinal:  Negative for abdominal pain.  Genitourinary:  Negative for dysuria and vaginal bleeding.  Musculoskeletal:  Negative for back pain.  Neurological:  Negative for syncope, light-headedness and headaches.  Psychiatric/Behavioral:  Negative for dysphoric mood.    Objective:  BP 112/70   Pulse 87   Temp 97.6 F (36.4 C) (Temporal)   Ht '5\' 9"'$  (1.753 m)   SpO2 98%   BMI 41.64 kg/m   Wt Readings from Last 3 Encounters:  08/05/22 282 lb (127.9 kg)  04/07/22 285 lb 4 oz (129.4 kg)  03/06/22 275 lb (124.7 kg)      Physical Exam Constitutional:      General: She is not in acute distress.    Appearance: Normal appearance. She is well-developed. She is obese. She is not ill-appearing or toxic-appearing.  HENT:     Head: Normocephalic.     Right Ear: Hearing, tympanic membrane, ear canal and external ear normal. Tympanic membrane is not erythematous, retracted or bulging.     Left Ear: Hearing, tympanic membrane, ear canal and external ear normal. Tympanic membrane is not erythematous, retracted or bulging.     Nose: No mucosal edema or rhinorrhea.     Right Sinus: No maxillary sinus  tenderness or frontal sinus tenderness.     Left Sinus: No maxillary sinus tenderness or frontal sinus tenderness.     Mouth/Throat:     Pharynx: Uvula midline.  Eyes:     General: Lids are normal. Lids are everted, no foreign bodies appreciated.     Conjunctiva/sclera: Conjunctivae normal.     Pupils: Pupils are equal,  round, and reactive to light.  Neck:     Thyroid: No thyroid mass or thyromegaly.     Vascular: No carotid bruit.     Trachea: Trachea normal.  Cardiovascular:     Rate and Rhythm: Normal rate and regular rhythm.     Pulses: Normal pulses.     Heart sounds: Normal heart sounds, S1 normal and S2 normal. No murmur heard.    No friction rub. No gallop.  Pulmonary:     Effort: Pulmonary effort is normal. No tachypnea or respiratory distress.     Breath sounds: Normal breath sounds. No decreased breath sounds, wheezing, rhonchi or rales.  Abdominal:     General: Bowel sounds are normal.     Palpations: Abdomen is soft.     Tenderness: There is no abdominal tenderness.  Musculoskeletal:     Cervical back: Normal range of motion and neck supple.     Right lower leg: 2+ Edema present.     Left lower leg: 1+ Edema present.  Skin:    General: Skin is warm and dry.     Findings: No rash.  Neurological:     Mental Status: She is alert.  Psychiatric:        Mood and Affect: Mood is not anxious or depressed.        Speech: Speech normal.        Behavior: Behavior normal. Behavior is cooperative.        Thought Content: Thought content normal.        Judgment: Judgment normal.       Results for orders placed or performed in visit on 03/16/22  CBC with Differential/Platelet  Result Value Ref Range   WBC 6.4 4.0 - 10.5 K/uL   RBC 4.52 3.87 - 5.11 Mil/uL   Hemoglobin 12.1 12.0 - 15.0 g/dL   HCT 38.1 36.0 - 46.0 %   MCV 84.4 78.0 - 100.0 fl   MCHC 31.8 30.0 - 36.0 g/dL   RDW 14.1 11.5 - 15.5 %   Platelets 222.0 150.0 - 400.0 K/uL   Neutrophils Relative % 46.4 43.0 -  77.0 %   Lymphocytes Relative 37.4 12.0 - 46.0 %   Monocytes Relative 10.1 3.0 - 12.0 %   Eosinophils Relative 5.3 (H) 0.0 - 5.0 %   Basophils Relative 0.8 0.0 - 3.0 %   Neutro Abs 3.0 1.4 - 7.7 K/uL   Lymphs Abs 2.4 0.7 - 4.0 K/uL   Monocytes Absolute 0.7 0.1 - 1.0 K/uL   Eosinophils Absolute 0.3 0.0 - 0.7 K/uL   Basophils Absolute 0.1 0.0 - 0.1 K/uL    Assessment and Plan  Peripheral edema Assessment & Plan: Acute, related to recent left knee surgery.  Given calf pain is bilateral with bilateral swelling, as well as with negative bilateral lower extremity Doppler on February 19 I think progressing clot is unlikely. She has some difficulty elevating her legs above her heart but when she does this she notices an improvement in the peripheral swelling bilaterally.  Will evaluate complete metabolic panel for possible secondary causes of peripheral swelling, if negative will have her hold hydrochlorothiazide and change to Lasix 20 mg daily along with potassium supplementation.  She will start this daily for the next 3 to 4 days and call for an update.  We may need to continue daily versus changed to as needed.  She will follow her blood pressure and look out for lightheadedness on this medication. Encouraged her to elevate her  legs above her heart. When she is cleared for physical therapy the swelling will likely continue to improve.  Orders: -     Comprehensive metabolic panel  Other orders -     Tirzepatide; Inject 2.5 mg into the skin once a week.  Dispense: 2 mL; Refill: 11 -     Furosemide; Take 1 tablet (20 mg total) by mouth daily. Take with potassium  Dispense: 15 tablet; Refill: 0 -     Potassium Chloride Crys ER; Take 1 tablet (20 mEq total) by mouth daily. Along with potassium.  Dispense: 15 tablet; Refill: 0    Return if symptoms worsen or fail to improve.   Eliezer Lofts, MD

## 2022-08-25 NOTE — Assessment & Plan Note (Signed)
Acute, related to recent left knee surgery.  Given calf pain is bilateral with bilateral swelling, as well as with negative bilateral lower extremity Doppler on February 19 I think progressing clot is unlikely. She has some difficulty elevating her legs above her heart but when she does this she notices an improvement in the peripheral swelling bilaterally.  Will evaluate complete metabolic panel for possible secondary causes of peripheral swelling, if negative will have her hold hydrochlorothiazide and change to Lasix 20 mg daily along with potassium supplementation.  She will start this daily for the next 3 to 4 days and call for an update.  We may need to continue daily versus changed to as needed.  She will follow her blood pressure and look out for lightheadedness on this medication. Encouraged her to elevate her legs above her heart. When she is cleared for physical therapy the swelling will likely continue to improve.

## 2022-09-01 ENCOUNTER — Other Ambulatory Visit: Payer: Self-pay | Admitting: Surgical

## 2022-09-03 ENCOUNTER — Other Ambulatory Visit: Payer: Self-pay | Admitting: Family Medicine

## 2022-09-03 DIAGNOSIS — E1159 Type 2 diabetes mellitus with other circulatory complications: Secondary | ICD-10-CM

## 2022-09-07 ENCOUNTER — Ambulatory Visit (INDEPENDENT_AMBULATORY_CARE_PROVIDER_SITE_OTHER): Payer: BC Managed Care – PPO | Admitting: Orthopedic Surgery

## 2022-09-07 ENCOUNTER — Encounter: Payer: Self-pay | Admitting: Orthopedic Surgery

## 2022-09-07 DIAGNOSIS — M25462 Effusion, left knee: Secondary | ICD-10-CM

## 2022-09-07 DIAGNOSIS — M25562 Pain in left knee: Secondary | ICD-10-CM

## 2022-09-07 DIAGNOSIS — S83272D Complex tear of lateral meniscus, current injury, left knee, subsequent encounter: Secondary | ICD-10-CM

## 2022-09-07 NOTE — Progress Notes (Signed)
Post-Op Visit Note   Patient: Christine Barrett           Date of Birth: 1961/05/10           MRN: [id number] Visit Date: 09/07/2022 PCP: Jinny Sanders, MD   Assessment & Plan:  Chief Complaint:  Chief Complaint  Patient presents with   Left Knee - Routine Post Op   Visit Diagnoses:  1. Complex tear of lateral meniscus of left knee as current injury, subsequent encounter   2. Left knee pain, unspecified chronicity   3. Effusion, left knee     Plan: [name] is a 62 year old patient who underwent left knee arthroscopy with anterior horn lateral meniscal repair performed 08/10/2022.  She is been weightbearing in a walker.  On examination there is a mild effusion present.  That is aspirated today and we got about 25 cc out.  No calf tenderness negative Homans.  Range of motion is to about 85 degrees.  Plan at this time is physical therapy here 2 times a week for 4 weeks for quad strengthening range of motion.  Follow-up in 4 weeks for clinical recheck.  Follow-Up Instructions: No follow-ups on file.   Orders:  Orders Placed This Encounter  Procedures   Ambulatory referral to Physical Therapy   No orders of the defined types were placed in this encounter.   Imaging: No results found.  PMFS History: Patient Active Problem List   Diagnosis Date Noted   Coronary artery disease involving native heart with angina pectoris, unspecified vessel or lesion type ([place]) 10/29/2021   Perineal irritation in female 08/15/2021   Dysuria 08/15/2021   Plantar fasciitis of right foot 07/04/2021   Trochanteric bursitis of left hip 04/08/2021   Pain of left middle finger 04/08/2021   Other allergic rhinitis 01/01/2021   Frequent episodes of sinusitis 01/01/2021   History of bronchitis 01/01/2021   Allergic conjunctivitis of both eyes 01/01/2021   Family history of breast cancer in sister 09/20/2020   Type 2 diabetes mellitus with other circulatory complications, HTN, CVD ([place]) 05/17/2020   Morbid  obesity ([place]) 05/17/2020   BMI 40.0-44.9, adult ([place]) 05/17/2020   Acute non-recurrent maxillary sinusitis 11/04/2016   Acute pain of both knees 08/28/2016   OSA (obstructive sleep apnea) 06/10/2016   Costochondritis 06/09/2016   Bilateral leg cramps 02/07/2016   Peripheral edema 01/29/2016   Hypertension associated with diabetes ([place])    GERD (gastroesophageal reflux disease) 08/10/2014   Menopausal syndrome 08/10/2014   CAD S/P percutaneous coronary angioplasty - Ostial AVG Cx - Scoring balloon PTCA 10/03/2013   Hyperlipidemia with target LDL less than 70 10/02/2013   H/O non-ST elevation myocardial infarction (NSTEMI) 10/02/2013   Normocytic anemia, not due to blood loss    Allergic sinusitis 03/17/2007   MIGRAINE, COMMON W/O INTRACTABLE MIGRAINE 03/16/2007   Past Medical History:  Diagnosis Date   Allergy    SEASONAL   Anemia    CAD S/P percutaneous coronary angioplasty 09/2013   a) Ostial AV G Cx - 2.5 mm Angiosculpt; mid LAD 40-60%; b) Myoview 07/2014: LOW RISK, small-severe fixed inferior defect c/w infarct w/o peri-infarct ischemia.   Chronic bronchitis ([place])    "frequently; not q yr" (10/03/2013)   Diabetes mellitus without complication (HCC)    Type 2-diet controlled.    Diverticulosis    Essential hypertension    with prior Accelerated HTN   GERD (gastroesophageal reflux disease)    Hiatal hernia    Hx of non-ST elevation myocardial  infarction (NSTEMI) 10/01/2013   Due to Accelerated HTN with existing CAD   Hyperlipemia    Migraine    "@ least once/month" (10/03/2013)   OSA (obstructive sleep apnea) 06/10/2016   Schatzki's ring    Seasonal allergies    Sinusitis    Spinal headache    during C-section of second child.     Family History  Adopted: Yes  Problem Relation Age of Onset   Allergic rhinitis Mother    Diabetes Mother    Sinusitis Mother    Breast cancer Sister    Hypertension Sister    Colon cancer Neg Hx    Heart attack Neg Hx    Stroke Neg Hx      Past Surgical History:  Procedure Laterality Date   ABDOMINAL HYSTERECTOMY  1994   "partial"   CAPSULAR RELEASE Right 06/06/2020   Procedure: RIGHT SHOULDER MANIPULATION UNDER ANESTHESIA, ROTATOR CUFF REPAIR;  Surgeon: Meredith Pel, MD;  Location: [place];  Service: Orthopedics;  Laterality: Right;   CARDIAC CATHETERIZATION N/A 08/05/2015   Procedure: Left Heart Cath and Coronary Angiography;  Surgeon: [name], MD;  Location: [place] [facility];  Service: Cardiovascular;  Laterality: N/A;   CESAREAN SECTION  1989   COLONOSCOPY     CORONARY ANGIOPLASTY  10/03/2013   95% ostial AV G Cx - 2.5 mm AngioSculpt Balloon PTCA; mid LAD 40-60%   LEFT HEART CATHETERIZATION WITH CORONARY ANGIOGRAM N/A 10/02/2013   Procedure: LEFT HEART CATHETERIZATION WITH CORONARY ANGIOGRAM;  Surgeon: [name], MD;  Location: [facility] CATH LAB;  Service: Cardiovascular;  Laterality: N/A;   NASAL SEPTOPLASTY W/ TURBINOPLASTY  ~ 2007   NM MYOVIEW LTD  08/22/2014    Low risk stress nuclear study with a small, severe, fixed defect in the distal inferior wall/apex suggestive of small prior infarct; no ischemia.  LV Wall Motion:  NL LV Function; NL Wall Motion   PERCUTANEOUS CORONARY STENT INTERVENTION (PCI-S) N/A 10/03/2013   cutting balloon angioplasty only no stent.    SINOSCOPY     TRANSTHORACIC ECHOCARDIOGRAM  10/02/2013   EF 55-60%; mild LVH, no RWMA,    TUBAL LIGATION  1989   Social History   Occupational History   Occupation: Research scientist (life sciences)  Tobacco Use   Smoking status: Never    Passive exposure: Never   Smokeless tobacco: Never  Vaping Use   Vaping Use: Never used  Substance and Sexual Activity   Alcohol use: Yes    Alcohol/week: 0.0 standard drinks of alcohol    Comment: occ   Drug use: No   Sexual activity: Yes    Birth control/protection: Surgical

## 2022-09-08 ENCOUNTER — Telehealth: Payer: Self-pay

## 2022-09-08 NOTE — Telephone Encounter (Signed)
-----   Message from Meredith Pel, MD sent at 09/07/2022  7:08 PM EDT ----- Corrin Parker can you set this patient up to follow-up with [facility] in 4 weeks.  Thanks

## 2022-09-08 NOTE — Telephone Encounter (Signed)
Pls make sure appt is scheduled per Dr Randel Pigg note.

## 2022-09-09 NOTE — Therapy (Incomplete)
OUTPATIENT PHYSICAL THERAPY EVALUATION   Patient Name: Christine Barrett MRN: [id number] DOB:1960/12/04, 62 y.o., female Today's Date: 09/09/2022  END OF SESSION:   Past Medical History:  Diagnosis Date   Allergy    SEASONAL   Anemia    CAD S/P percutaneous coronary angioplasty 09/2013   a) Ostial AV G Cx - 2.5 mm Angiosculpt; mid LAD 40-60%; b) Myoview 07/2014: LOW RISK, small-severe fixed inferior defect c/w infarct w/o peri-infarct ischemia.   Chronic bronchitis ([place])    "frequently; not q yr" (10/03/2013)   Diabetes mellitus without complication (HCC)    Type 2-diet controlled.    Diverticulosis    Essential hypertension    with prior Accelerated HTN   GERD (gastroesophageal reflux disease)    Hiatal hernia    Hx of non-ST elevation myocardial infarction (NSTEMI) 10/01/2013   Due to Accelerated HTN with existing CAD   Hyperlipemia    Migraine    "@ least once/month" (10/03/2013)   OSA (obstructive sleep apnea) 06/10/2016   Schatzki's ring    Seasonal allergies    Sinusitis    Spinal headache    during C-section of second child.    Past Surgical History:  Procedure Laterality Date   ABDOMINAL HYSTERECTOMY  1994   "partial"   CAPSULAR RELEASE Right 06/06/2020   Procedure: RIGHT SHOULDER MANIPULATION UNDER ANESTHESIA, ROTATOR CUFF REPAIR;  Surgeon: Meredith Pel, MD;  Location: [place];  Service: Orthopedics;  Laterality: Right;   CARDIAC CATHETERIZATION N/A 08/05/2015   Procedure: Left Heart Cath and Coronary Angiography;  Surgeon: [name], MD;  Location: [place] [facility];  Service: Cardiovascular;  Laterality: N/A;   CESAREAN SECTION  1989   COLONOSCOPY     CORONARY ANGIOPLASTY  10/03/2013   95% ostial AV G Cx - 2.5 mm AngioSculpt Balloon PTCA; mid LAD 40-60%   LEFT HEART CATHETERIZATION WITH CORONARY ANGIOGRAM N/A 10/02/2013   Procedure: LEFT HEART CATHETERIZATION WITH CORONARY ANGIOGRAM;  Surgeon: [name], MD;  Location: [facility] CATH LAB;  Service:  Cardiovascular;  Laterality: N/A;   NASAL SEPTOPLASTY W/ TURBINOPLASTY  ~ 2007   NM MYOVIEW LTD  08/22/2014    Low risk stress nuclear study with a small, severe, fixed defect in the distal inferior wall/apex suggestive of small prior infarct; no ischemia.  LV Wall Motion:  NL LV Function; NL Wall Motion   PERCUTANEOUS CORONARY STENT INTERVENTION (PCI-S) N/A 10/03/2013   cutting balloon angioplasty only no stent.    SINOSCOPY     TRANSTHORACIC ECHOCARDIOGRAM  10/02/2013   EF 55-60%; mild LVH, no RWMA,    TUBAL LIGATION  1989   Patient Active Problem List   Diagnosis Date Noted   Coronary artery disease involving native heart with angina pectoris, unspecified vessel or lesion type ([place]) 10/29/2021   Perineal irritation in female 08/15/2021   Dysuria 08/15/2021   Plantar fasciitis of right foot 07/04/2021   Trochanteric bursitis of left hip 04/08/2021   Pain of left middle finger 04/08/2021   Other allergic rhinitis 01/01/2021   Frequent episodes of sinusitis 01/01/2021   History of bronchitis 01/01/2021   Allergic conjunctivitis of both eyes 01/01/2021   Family history of breast cancer in sister 09/20/2020   Type 2 diabetes mellitus with other circulatory complications, HTN, CVD ([place]) 05/17/2020   Morbid obesity ([place]) 05/17/2020   BMI 40.0-44.9, adult ([place]) 05/17/2020   Acute non-recurrent maxillary sinusitis 11/04/2016   Acute pain of both knees 08/28/2016   OSA (obstructive sleep apnea) 06/10/2016  Costochondritis 06/09/2016   Bilateral leg cramps 02/07/2016   Peripheral edema 01/29/2016   Hypertension associated with diabetes ([place])    GERD (gastroesophageal reflux disease) 08/10/2014   Menopausal syndrome 08/10/2014   CAD S/P percutaneous coronary angioplasty - Ostial AVG Cx - Scoring balloon PTCA 10/03/2013   Hyperlipidemia with target LDL less than 70 10/02/2013   H/O non-ST elevation myocardial infarction (NSTEMI) 10/02/2013   Normocytic anemia, not due to blood loss     Allergic sinusitis 03/17/2007   MIGRAINE, COMMON W/O INTRACTABLE MIGRAINE 03/16/2007    PCP: Jinny Sanders MD  REFERRING PROVIDER: Meredith Pel, MD  REFERRING DIAG: [phone] (ICD-10-CM) - Complex tear of lateral meniscus of left knee as current injury, subsequent encounter M25.562 (ICD-10-CM) - Left knee pain, unspecified chronicity M25.462 (ICD-10-CM) - Effusion, left knee  THERAPY DIAG:  No diagnosis found.  Rationale for Evaluation and Treatment: Rehabilitation  ONSET DATE: Surgery 08/10/2022  SUBJECTIVE:   SUBJECTIVE STATEMENT: S/p Lt knee arthroscopy with anterior horn lateral meniscus repair 08/10/2022.  Was WB with walker for weeks after surgery performed.   PERTINENT HISTORY: Checked about swelling amount with doppler on Feb 19th with decreased MD noted DVT unlikely (see 08/25/2022 MD note) PAIN:  NPRS scale: ***/10 Pain location: Lt knee  Pain description: *** Aggravating factors: *** Relieving factors: ***  PRECAUTIONS: Meniscus repair.   WEIGHT BEARING RESTRICTIONS: PWB for 2 weeks (ending 09/21/2022 per MD note then FWB after)  FALLS:  Has patient fallen in last 6 months? No  LIVING ENVIRONMENT: Lives with: {OPRC lives with:25569::"lives with their family"} Lives in: {Lives in:25570} Stairs: {opstairs:27293} Has following equipment at home: {Assistive devices:23999}  OCCUPATION: ***  PLOF: Independent  PATIENT GOALS: ***  Next MD visit:   OBJECTIVE:   PATIENT SURVEYS:  FOTO intake:    predicted:    COGNITION: Overall cognitive status: WFL    SENSATION: {sensation:27233}  EDEMA:  {edema:24020}  MUSCLE LENGTH: Hamstrings: Right *** deg; Left *** deg Thomas test: Right *** deg; Left *** deg  POSTURE: {posture:25561}  PALPATION: ***  LOWER EXTREMITY ROM:   ROM Right eval Left eval  Hip flexion    Hip extension    Hip abduction    Hip adduction    Hip internal rotation    Hip external rotation    Knee flexion     Knee extension    Ankle dorsiflexion    Ankle plantarflexion    Ankle inversion    Ankle eversion     (Blank rows = not tested)  LOWER EXTREMITY MMT:  MMT Right eval Left eval  Hip flexion    Hip extension    Hip abduction    Hip adduction    Hip internal rotation    Hip external rotation    Knee flexion    Knee extension    Ankle dorsiflexion    Ankle plantarflexion    Ankle inversion    Ankle eversion     (Blank rows = not tested)  LOWER EXTREMITY SPECIAL TESTS:  {LEspecialtests:26242}  FUNCTIONAL TESTS:  18 inch chair transfer: Lt SLS: Rt SLS:  GAIT: Distance walked: *** Assistive device utilized: {Assistive devices:23999} Level of assistance: {Levels of assistance:24026} Comments: ***   TODAY'S TREATMENT  DATE: Therex:    HEP instruction/performance c cues for techniques, handout provided.  Trial set performed of each for comprehension and symptom assessment.  See below for exercise list  PATIENT EDUCATION:  Education details: HEP, POC Person educated: Patient Education method: Explanation, Demonstration, Verbal cues, and Handouts Education comprehension: verbalized understanding, returned demonstration, and verbal cues required  HOME EXERCISE PROGRAM: ***  ASSESSMENT:  CLINICAL IMPRESSION: Patient is a 62 y.o. who comes to clinic with complaints of Lt knee pain with mobility, strength and movement coordination deficits that impair their ability to perform usual daily and recreational functional activities without increase difficulty/symptoms at this time.  Patient to benefit from skilled PT services to address impairments and limitations to improve to previous level of function without restriction secondary to condition.   OBJECTIVE IMPAIRMENTS: {opptimpairments:25111}.   ACTIVITY LIMITATIONS: {activitylimitations:27494}  PARTICIPATION LIMITATIONS:  {participationrestrictions:25113}  PERSONAL FACTORS:  DM, GERD, HTN, hyperlipidemia  are also affecting patient's functional outcome.   REHAB POTENTIAL: Good  CLINICAL DECISION MAKING: Stable/uncomplicated  EVALUATION COMPLEXITY: Low   GOALS: Goals reviewed with patient? Yes  SHORT TERM GOALS: (target date for Short term goals are 3 weeks ***)   1.  Patient will demonstrate independent use of home exercise program to maintain progress from in clinic treatments.  Goal status: New  LONG TERM GOALS: (target dates for all long term goals are 10 weeks  *** )   1. Patient will demonstrate/report pain at worst less than or equal to 2/10 to facilitate minimal limitation in daily activity secondary to pain symptoms.  Goal status: New   2. Patient will demonstrate independent use of home exercise program to facilitate ability to maintain/progress functional gains from skilled physical therapy services.  Goal status: New   3. Patient will demonstrate FOTO outcome > or = *** % to indicate reduced disability due to condition.  Goal status: New   4.  Patient will demonstrate Lt LE MMT 5/5 throughout to faciltiate usual transfers, stairs, squatting at [facility] for daily life.   Goal status: New   5.  Patient will demonstrate Lt knee AROM 0-110 deg to facilitate transfers, ambulation, bending for daily activity at PLOF s limitation.  Goal status: New   6.  *** Goal status: New   7.  *** Goal Status: New   PLAN:  PT FREQUENCY: 1-2x/week  PT DURATION: 10 weeks  PLANNED INTERVENTIONS: Therapeutic exercises, Therapeutic activity, Neuro Muscular re-education, Balance training, Gait training, Patient/Family education, Joint mobilization, Stair training, DME instructions, Dry Needling, Electrical stimulation, Traction, Cryotherapy, vasopneumatic deviceMoist heat, Taping, Ultrasound, Ionotophoresis '4mg'$ /ml Dexamethasone, and aquatic therapy, Manual therapy.  All included unless  contraindicated  PLAN FOR NEXT SESSION: Review HEP knowledge/results.      Scot Jun, PT, DPT, OCS, ATC 09/09/22  1:31 PM

## 2022-09-11 ENCOUNTER — Ambulatory Visit: Payer: BC Managed Care – PPO | Admitting: Physical Therapy

## 2022-09-11 ENCOUNTER — Encounter: Payer: Self-pay | Admitting: Physical Therapy

## 2022-09-11 ENCOUNTER — Other Ambulatory Visit: Payer: Self-pay

## 2022-09-11 DIAGNOSIS — R600 Localized edema: Secondary | ICD-10-CM | POA: Diagnosis not present

## 2022-09-11 DIAGNOSIS — M25662 Stiffness of left knee, not elsewhere classified: Secondary | ICD-10-CM | POA: Diagnosis not present

## 2022-09-11 DIAGNOSIS — R2689 Other abnormalities of gait and mobility: Secondary | ICD-10-CM

## 2022-09-11 DIAGNOSIS — M25562 Pain in left knee: Secondary | ICD-10-CM | POA: Diagnosis not present

## 2022-09-11 DIAGNOSIS — M6281 Muscle weakness (generalized): Secondary | ICD-10-CM | POA: Diagnosis not present

## 2022-09-11 NOTE — Therapy (Signed)
OUTPATIENT PHYSICAL THERAPY EVALUATION   Patient Name: Christine Barrett MRN: [id number] DOB:1961/02/13, 62 y.o., female Today's Date: 09/11/2022  END OF SESSION:  PT End of Session - 09/11/22 0836     Visit Number 1    Number of Visits 12    Date for PT Re-Evaluation 10/23/22    Authorization Type BCBS $30 copay, 90 visit limit    PT Start Time 0805    PT Stop Time 0835    PT Time Calculation (min) 30 min    Activity Tolerance Patient tolerated treatment well    Behavior During Therapy [facility] for tasks assessed/performed             Past Medical History:  Diagnosis Date   Allergy    SEASONAL   Anemia    CAD S/P percutaneous coronary angioplasty 09/2013   a) Ostial AV G Cx - 2.5 mm Angiosculpt; mid LAD 40-60%; b) Myoview 07/2014: LOW RISK, small-severe fixed inferior defect c/w infarct w/o peri-infarct ischemia.   Chronic bronchitis ([place])    "frequently; not q yr" (10/03/2013)   Diabetes mellitus without complication (HCC)    Type 2-diet controlled.    Diverticulosis    Essential hypertension    with prior Accelerated HTN   GERD (gastroesophageal reflux disease)    Hiatal hernia    Hx of non-ST elevation myocardial infarction (NSTEMI) 10/01/2013   Due to Accelerated HTN with existing CAD   Hyperlipemia    Migraine    "@ least once/month" (10/03/2013)   OSA (obstructive sleep apnea) 06/10/2016   Schatzki's ring    Seasonal allergies    Sinusitis    Spinal headache    during C-section of second child.    Past Surgical History:  Procedure Laterality Date   ABDOMINAL HYSTERECTOMY  1994   "partial"   CAPSULAR RELEASE Right 06/06/2020   Procedure: RIGHT SHOULDER MANIPULATION UNDER ANESTHESIA, ROTATOR CUFF REPAIR;  Surgeon: Meredith Pel, MD;  Location: [place];  Service: Orthopedics;  Laterality: Right;   CARDIAC CATHETERIZATION N/A 08/05/2015   Procedure: Left Heart Cath and Coronary Angiography;  Surgeon: [name], MD;  Location: [place] CV LAB;  Service:  Cardiovascular;  Laterality: N/A;   CESAREAN SECTION  1989   COLONOSCOPY     CORONARY ANGIOPLASTY  10/03/2013   95% ostial AV G Cx - 2.5 mm AngioSculpt Balloon PTCA; mid LAD 40-60%   LEFT HEART CATHETERIZATION WITH CORONARY ANGIOGRAM N/A 10/02/2013   Procedure: LEFT HEART CATHETERIZATION WITH CORONARY ANGIOGRAM;  Surgeon: [name], MD;  Location: [facility] CATH LAB;  Service: Cardiovascular;  Laterality: N/A;   NASAL SEPTOPLASTY W/ TURBINOPLASTY  ~ 2007   NM MYOVIEW LTD  08/22/2014    Low risk stress nuclear study with a small, severe, fixed defect in the distal inferior wall/apex suggestive of small prior infarct; no ischemia.  LV Wall Motion:  NL LV Function; NL Wall Motion   PERCUTANEOUS CORONARY STENT INTERVENTION (PCI-S) N/A 10/03/2013   cutting balloon angioplasty only no stent.    SINOSCOPY     TRANSTHORACIC ECHOCARDIOGRAM  10/02/2013   EF 55-60%; mild LVH, no RWMA,    TUBAL LIGATION  1989   Patient Active Problem List   Diagnosis Date Noted   Coronary artery disease involving native heart with angina pectoris, unspecified vessel or lesion type ([place]) 10/29/2021   Perineal irritation in female 08/15/2021   Dysuria 08/15/2021   Plantar fasciitis of right foot 07/04/2021   Trochanteric bursitis of left hip 04/08/2021  Pain of left middle finger 04/08/2021   Other allergic rhinitis 01/01/2021   Frequent episodes of sinusitis 01/01/2021   History of bronchitis 01/01/2021   Allergic conjunctivitis of both eyes 01/01/2021   Family history of breast cancer in sister 09/20/2020   Type 2 diabetes mellitus with other circulatory complications, HTN, CVD ([place]) 05/17/2020   Morbid obesity ([place]) 05/17/2020   BMI 40.0-44.9, adult ([place]) 05/17/2020   Acute non-recurrent maxillary sinusitis 11/04/2016   Acute pain of both knees 08/28/2016   OSA (obstructive sleep apnea) 06/10/2016   Costochondritis 06/09/2016   Bilateral leg cramps 02/07/2016   Peripheral edema 01/29/2016   Hypertension  associated with diabetes ([place])    GERD (gastroesophageal reflux disease) 08/10/2014   Menopausal syndrome 08/10/2014   CAD S/P percutaneous coronary angioplasty - Ostial AVG Cx - Scoring balloon PTCA 10/03/2013   Hyperlipidemia with target LDL less than 70 10/02/2013   H/O non-ST elevation myocardial infarction (NSTEMI) 10/02/2013   Normocytic anemia, not due to blood loss    Allergic sinusitis 03/17/2007   MIGRAINE, COMMON W/O INTRACTABLE MIGRAINE 03/16/2007    PCP: Jinny Sanders MD  REFERRING PROVIDER: Meredith Pel, MD  REFERRING DIAG: [phone] (ICD-10-CM) - Complex tear of lateral meniscus of left knee as current injury, subsequent encounter M25.562 (ICD-10-CM) - Left knee pain, unspecified chronicity M25.462 (ICD-10-CM) - Effusion, left knee  THERAPY DIAG:  Acute pain of left knee  Localized edema  Stiffness of left knee, not elsewhere classified  Muscle weakness (generalized)  Other abnormalities of gait and mobility  Rationale for Evaluation and Treatment: Rehabilitation  ONSET DATE: Surgery 08/10/2022  SUBJECTIVE:   SUBJECTIVE STATEMENT: S/p Lt knee arthroscopy with anterior horn lateral meniscus repair 08/10/2022.  Was WB with walker for weeks after surgery performed. She's had a lot of swelling in her leg, but that has improved with elevation.  PERTINENT HISTORY: Anemia, CAD, DM, HTN, HLD, hx NSTEMI, hx migraines  PAIN:  NPRS scale: 0 currently, up to 3/10 Pain location: Lt knee  Pain description: sore Aggravating factors: bending knee, prolonged positioning Relieving factors: repositioning, ice  PRECAUTIONS: Meniscus repair.   WEIGHT BEARING RESTRICTIONS: PWB for 2 weeks (ending 09/21/2022 per MD note then FWB after)  FALLS:  Has patient fallen in last 6 months? No  LIVING ENVIRONMENT: Lives with: lives alone and currently living at son's house (1 step to enter his home, and staying downstairs) Lives in: House/apartment Stairs: No Has  following equipment at home: Single point cane and Std W  OCCUPATION: Programme researcher, broadcasting/film/video; currently not working  PLOF: Independent; spend time with grandchildren, go out with friends; travel, no regular exercise  PATIENT GOALS: improve knee function, walk without device  Next MD visit: 10/05/22   OBJECTIVE:   PATIENT SURVEYS:  09/11/22: FOTO 29 (predicted 58)  COGNITION: Overall cognitive status: WFL   EDEMA:  09/11/22: not formally measured but Lt knee and lower leg edema present  POSTURE: rounded shoulders and forward head  LOWER EXTREMITY ROM:   ROM Right eval Left eval  Knee flexion  A: 87 P:90  Knee extension  A: -5 (LAQ) P: 0   (Blank rows = not tested)  LOWER EXTREMITY MMT:  Deferred formal testing; Lt knee grossly 3-/5  MMT Right eval Left eval  Hip flexion    Hip extension    Hip abduction    Hip adduction    Hip internal rotation    Hip external rotation    Knee flexion  Knee extension    Ankle dorsiflexion    Ankle plantarflexion    Ankle inversion    Ankle eversion     (Blank rows = not tested)  FUNCTIONAL TESTS:  09/11/22:  5x STS: 24.12 sec with UE support needed  GAIT: 09/11/22 Distance walked: 100' Assistive device utilized:  Std W Level of assistance: Modified independence Comments: antalgic gait; decreased velocity   TODAY'S TREATMENT  DATE: 09/11/22 Therex:    HEP instruction/performance c cues for techniques, handout provided.  Trial set performed of each for comprehension and symptom assessment.  See below for exercise list  PATIENT EDUCATION:  Education details: HEP, POC Person educated: Patient Education method: Explanation, Demonstration, Verbal cues, and Handouts Education comprehension: verbalized understanding, returned demonstration, and verbal cues required  HOME EXERCISE PROGRAM: Access Code: [email or web address] URL: [email or web address] Date: 09/11/2022 Prepared by: Faustino Congress  Exercises - Long Sitting Quad Set  - 5-10 x daily - 7 x weekly - 1 sets - 5-10 reps - 5 sec hold - Supine Heel Slide with Strap  - 5-10 x daily - 7 x weekly - 1 sets - 5-10 reps - Seated Knee Extension AROM  - 3-5 x daily - 7 x weekly - 1-2 sets - 10 reps - 5 sec hold - Seated Knee Flexion Stretch  - 3-5 x daily - 7 x weekly - 1-2 sets - 10 reps - 5 sec hold  ASSESSMENT:  CLINICAL IMPRESSION: Pt is a 62 y/o female who presents to OPPT s/p Lt knee scope with meniscus repair on 08/10/22.She demonstrates decreased strength and ROM as well as gait abnormalities and expected post op pain and swelling affecting function.  She will benefit from PT to address deficits listed.    OBJECTIVE IMPAIRMENTS: Abnormal gait, decreased activity tolerance, decreased balance, decreased endurance, decreased knowledge of use of DME, decreased mobility, difficulty walking, decreased ROM, decreased strength, increased edema, increased fascial restrictions, and pain.   ACTIVITY LIMITATIONS: carrying, lifting, bending, sitting, standing, squatting, sleeping, stairs, transfers, bed mobility, and locomotion level  PARTICIPATION LIMITATIONS: meal prep, cleaning, laundry, driving, shopping, community activity, and occupation  PERSONAL FACTORS: 3+ comorbidities: Anemia, CAD, DM, HTN, HLD, hx NSTEMI, hx migraines  are also affecting patient's functional outcome.   REHAB POTENTIAL: Good  CLINICAL DECISION MAKING: Stable/uncomplicated  EVALUATION COMPLEXITY: Low   GOALS: Goals reviewed with patient? Yes  SHORT TERM GOALS: (target date for Short term goals are 3 weeks 10/02/2022)   1.  Patient will demonstrate independent use of home exercise program to maintain progress from in clinic treatments.  Goal status: New  LONG TERM GOALS: (target dates for all long term goals: 10/23/2022    1. Patient will demonstrate/report pain at worst less than or equal to 2/10 to facilitate minimal limitation in daily  activity secondary to pain symptoms. Goal status: New   2. Patient will demonstrate independent use of home exercise program to facilitate ability to maintain/progress functional gains from skilled physical therapy services. Goal status: New   3. Patient will demonstrate FOTO outcome > or = 53 % to indicate reduced disability due to condition. Goal status: New   4.  Patient will demonstrate Lt LE MMT 5/5 throughout to faciltiate usual transfers, stairs, squatting at [facility] for daily life.  Goal status: New   5.  Patient will demonstrate Lt knee AROM 0-110 deg to facilitate transfers, ambulation, bending for daily activity at PLOF s limitation.  Goal status: New   6.  Improve 5x STS to <  20 sec without UE support for improved functional strength Goal status: New  PLAN:  PT FREQUENCY: 1-2x/week  PT DURATION: 6 weeks  PLANNED INTERVENTIONS: Therapeutic exercises, Therapeutic activity, Neuro Muscular re-education, Balance training, Gait training, Patient/Family education, Joint mobilization, Stair training, DME instructions, Dry Needling, Electrical stimulation, Traction, Cryotherapy, vasopneumatic deviceMoist heat, Taping, Ultrasound, Ionotophoresis 4mg /ml Dexamethasone, and aquatic therapy, Manual therapy.  All included unless contraindicated  PLAN FOR NEXT SESSION:  Review HEP knowledge/results. Work on ROM, PWB until 09/21/22    Laureen Abrahams, PT, DPT 09/11/22 8:36 AM

## 2022-09-14 ENCOUNTER — Ambulatory Visit: Payer: BC Managed Care – PPO | Admitting: Rehabilitative and Restorative Service Providers"

## 2022-09-14 ENCOUNTER — Encounter: Payer: Self-pay | Admitting: Rehabilitative and Restorative Service Providers"

## 2022-09-14 DIAGNOSIS — M6281 Muscle weakness (generalized): Secondary | ICD-10-CM | POA: Diagnosis not present

## 2022-09-14 DIAGNOSIS — M25662 Stiffness of left knee, not elsewhere classified: Secondary | ICD-10-CM

## 2022-09-14 DIAGNOSIS — R600 Localized edema: Secondary | ICD-10-CM | POA: Diagnosis not present

## 2022-09-14 DIAGNOSIS — M25562 Pain in left knee: Secondary | ICD-10-CM

## 2022-09-14 DIAGNOSIS — R2689 Other abnormalities of gait and mobility: Secondary | ICD-10-CM

## 2022-09-14 NOTE — Therapy (Signed)
OUTPATIENT PHYSICAL THERAPY TREATMENT   Patient Name: Christine Barrett MRN: [id number] DOB:1960-12-08, 62 y.o., female Today's Date: 09/14/2022  END OF SESSION:  PT End of Session - 09/14/22 1003     Visit Number 2    Number of Visits 12    Date for PT Re-Evaluation 10/23/22    Authorization Type [employer] $30 copay, 90 visit limit    PT Start Time 1004    PT Stop Time 1043    PT Time Calculation (min) 39 min    Activity Tolerance Patient limited by pain    Behavior During Therapy [facility] for tasks assessed/performed              Past Medical History:  Diagnosis Date   Allergy    SEASONAL   Anemia    CAD S/P percutaneous coronary angioplasty 09/2013   a) Ostial AV G Cx - 2.5 mm Angiosculpt; mid LAD 40-60%; b) Myoview 07/2014: LOW RISK, small-severe fixed inferior defect c/w infarct w/o peri-infarct ischemia.   Chronic bronchitis ([place])    "frequently; not q yr" (10/03/2013)   Diabetes mellitus without complication (HCC)    Type 2-diet controlled.    Diverticulosis    Essential hypertension    with prior Accelerated HTN   GERD (gastroesophageal reflux disease)    Hiatal hernia    Hx of non-ST elevation myocardial infarction (NSTEMI) 10/01/2013   Due to Accelerated HTN with existing CAD   Hyperlipemia    Migraine    "@ least once/month" (10/03/2013)   OSA (obstructive sleep apnea) 06/10/2016   Schatzki's ring    Seasonal allergies    Sinusitis    Spinal headache    during C-section of second child.    Past Surgical History:  Procedure Laterality Date   ABDOMINAL HYSTERECTOMY  1994   "partial"   CAPSULAR RELEASE Right 06/06/2020   Procedure: RIGHT SHOULDER MANIPULATION UNDER ANESTHESIA, ROTATOR CUFF REPAIR;  Surgeon: Meredith Pel, MD;  Location: [place];  Service: Orthopedics;  Laterality: Right;   CARDIAC CATHETERIZATION N/A 08/05/2015   Procedure: Left Heart Cath and Coronary Angiography;  Surgeon: [name], MD;  Location: [place] CV LAB;  Service:  Cardiovascular;  Laterality: N/A;   CESAREAN SECTION  1989   COLONOSCOPY     CORONARY ANGIOPLASTY  10/03/2013   95% ostial AV G Cx - 2.5 mm AngioSculpt Balloon PTCA; mid LAD 40-60%   LEFT HEART CATHETERIZATION WITH CORONARY ANGIOGRAM N/A 10/02/2013   Procedure: LEFT HEART CATHETERIZATION WITH CORONARY ANGIOGRAM;  Surgeon: [name], MD;  Location: [facility] CATH LAB;  Service: Cardiovascular;  Laterality: N/A;   NASAL SEPTOPLASTY W/ TURBINOPLASTY  ~ 2007   NM MYOVIEW LTD  08/22/2014    Low risk stress nuclear study with a small, severe, fixed defect in the distal inferior wall/apex suggestive of small prior infarct; no ischemia.  LV Wall Motion:  NL LV Function; NL Wall Motion   PERCUTANEOUS CORONARY STENT INTERVENTION (PCI-S) N/A 10/03/2013   cutting balloon angioplasty only no stent.    SINOSCOPY     TRANSTHORACIC ECHOCARDIOGRAM  10/02/2013   EF 55-60%; mild LVH, no RWMA,    TUBAL LIGATION  1989   Patient Active Problem List   Diagnosis Date Noted   Coronary artery disease involving native heart with angina pectoris, unspecified vessel or lesion type ([place]) 10/29/2021   Perineal irritation in female 08/15/2021   Dysuria 08/15/2021   Plantar fasciitis of right foot 07/04/2021   Trochanteric bursitis of left hip 04/08/2021  Pain of left middle finger 04/08/2021   Other allergic rhinitis 01/01/2021   Frequent episodes of sinusitis 01/01/2021   History of bronchitis 01/01/2021   Allergic conjunctivitis of both eyes 01/01/2021   Family history of breast cancer in sister 09/20/2020   Type 2 diabetes mellitus with other circulatory complications, HTN, CVD ([place]) 05/17/2020   Morbid obesity ([place]) 05/17/2020   BMI 40.0-44.9, adult ([place]) 05/17/2020   Acute non-recurrent maxillary sinusitis 11/04/2016   Acute pain of both knees 08/28/2016   OSA (obstructive sleep apnea) 06/10/2016   Costochondritis 06/09/2016   Bilateral leg cramps 02/07/2016   Peripheral edema 01/29/2016   Hypertension  associated with diabetes ([place])    GERD (gastroesophageal reflux disease) 08/10/2014   Menopausal syndrome 08/10/2014   CAD S/P percutaneous coronary angioplasty - Ostial AVG Cx - Scoring balloon PTCA 10/03/2013   Hyperlipidemia with target LDL less than 70 10/02/2013   H/O non-ST elevation myocardial infarction (NSTEMI) 10/02/2013   Normocytic anemia, not due to blood loss    Allergic sinusitis 03/17/2007   MIGRAINE, COMMON W/O INTRACTABLE MIGRAINE 03/16/2007    PCP: Jinny Sanders MD  REFERRING PROVIDER: Meredith Pel, MD  REFERRING DIAG: [phone] (ICD-10-CM) - Complex tear of lateral meniscus of left knee as current injury, subsequent encounter M25.562 (ICD-10-CM) - Left knee pain, unspecified chronicity M25.462 (ICD-10-CM) - Effusion, left knee  THERAPY DIAG:  Acute pain of left knee  Localized edema  Stiffness of left knee, not elsewhere classified  Muscle weakness (generalized)  Other abnormalities of gait and mobility  Rationale for Evaluation and Treatment: Rehabilitation  ONSET DATE: Surgery 08/10/2022  SUBJECTIVE:   SUBJECTIVE STATEMENT: She indicated no specific pain complaints at rest upon arrival.  She mentioned soreness yesterday after being out and about.  Using standard walker for ambulation at this time.  She reported getting swelling at times.   PERTINENT HISTORY: Anemia, CAD, DM, HTN, HLD, hx NSTEMI, hx migraines  PAIN:  NPRS scale: 0 currently, up to 3/10 Pain location: Lt knee  Pain description: sore Aggravating factors: bending knee, prolonged positioning, standing/walking Relieving factors: repositioning, ice  PRECAUTIONS: Meniscus repair.   WEIGHT BEARING RESTRICTIONS: PWB for 2 weeks (ending 09/21/2022 per MD note then FWB after)  FALLS:  Has patient fallen in last 6 months? No  LIVING ENVIRONMENT: Lives with: lives alone and currently living at son's house (1 step to enter his home, and staying downstairs) Lives in:  House/apartment Stairs: No Has following equipment at home: Single point cane and Std W  OCCUPATION: Programme researcher, broadcasting/film/video; currently not working  PLOF: Independent; spend time with grandchildren, go out with friends; travel, no regular exercise  PATIENT GOALS: improve knee function, walk without device  Next MD visit: 10/05/22   OBJECTIVE:   PATIENT SURVEYS:  09/11/22: FOTO 29 (predicted 59)  COGNITION: Overall cognitive status: WFL   EDEMA:  09/11/22: not formally measured but Lt knee and lower leg edema present  POSTURE: rounded shoulders and forward head  LOWER EXTREMITY ROM:   ROM Right eval Left eval Left 09/14/2022  Knee flexion  A: 87 P:90 AROM in supine heel slide: 102  Knee extension  A: -5 (LAQ) P: 0    (Blank rows = not tested)  LOWER EXTREMITY MMT:  Deferred formal testing; Lt knee grossly 3-/5  MMT Right eval Left eval  Hip flexion    Hip extension    Hip abduction    Hip adduction    Hip internal rotation  Hip external rotation    Knee flexion    Knee extension    Ankle dorsiflexion    Ankle plantarflexion    Ankle inversion    Ankle eversion     (Blank rows = not tested)  FUNCTIONAL TESTS:  09/11/22:  5x STS: 24.12 sec with UE support needed  GAIT: 09/11/22 Distance walked: 100' Assistive device utilized:  Std W Level of assistance: Modified independence Comments: antalgic gait; decreased velocity   TODAY'S TREATMENT:                                                 DATE:09/14/22 Therex: Nustep Lvl 5 UE/LE  Seated Lt knee LAQ c pauses in end range x 15  Seated quad set Lt 5 sec hold x 10  Supine Lt knee AROM heel slide c quad set combo 5 sec hold each way x 10  Supine LAQ x 10 (cues for home for swelling reduction) Supine Lt SLR x 10     TODAY'S TREATMENT:                                                 DATE:09/11/22 Therex:    HEP instruction/performance c cues for techniques, handout provided.  Trial set performed of  each for comprehension and symptom assessment.  See below for exercise list  PATIENT EDUCATION:  Education details: HEP, POC Person educated: Patient Education method: Explanation, Demonstration, Verbal cues, and Handouts Education comprehension: verbalized understanding, returned demonstration, and verbal cues required  HOME EXERCISE PROGRAM: Access Code: [email or web address] URL: [email or web address] Date: 09/14/2022 Prepared by: Scot Jun  Exercises - Supine Heel Slide with Strap  - 5-10 x daily - 7 x weekly - 1 sets - 5-10 reps - Seated Knee Extension AROM (Mirrored)  - 3-5 x daily - 7 x weekly - 1-2 sets - 10 reps - 5 sec hold - Seated Knee Flexion Stretch (Mirrored)  - 3-5 x daily - 7 x weekly - 1-2 sets - 10 reps - 5 sec hold - Seated Quad Set (Mirrored)  - 3-5 x daily - 7 x weekly - 1 sets - 10 reps - 5 hold - Supine 90/90 Sciatic Nerve Glide with Knee Flexion/Extension  - 1-2 x daily - 7 x weekly - 1-2 sets - 10 reps  ASSESSMENT:  CLINICAL IMPRESSION: Continued focus on PWB activity within clinic c goals of mobility and strength gains by date where WBAT is allowed to promote stability and transitioning to [facility] as appropriate in future.  Continued skilled PT services warranted at this time to continue to work towards established goals.    OBJECTIVE IMPAIRMENTS: Abnormal gait, decreased activity tolerance, decreased balance, decreased endurance, decreased knowledge of use of DME, decreased mobility, difficulty walking, decreased ROM, decreased strength, increased edema, increased fascial restrictions, and pain.   ACTIVITY LIMITATIONS: carrying, lifting, bending, sitting, standing, squatting, sleeping, stairs, transfers, bed mobility, and locomotion level  PARTICIPATION LIMITATIONS: meal prep, cleaning, laundry, driving, shopping, community activity, and occupation  PERSONAL FACTORS: 3+ comorbidities: Anemia, CAD, DM, HTN, HLD, hx NSTEMI, hx migraines  are also affecting  patient's functional outcome.   REHAB POTENTIAL: Good  CLINICAL DECISION MAKING: Stable/uncomplicated  EVALUATION COMPLEXITY: Low   GOALS: Goals reviewed  with patient? Yes  SHORT TERM GOALS: (target date for Short term goals are 3 weeks 10/02/2022)   1.  Patient will demonstrate independent use of home exercise program to maintain progress from in clinic treatments.  Goal status: New  LONG TERM GOALS: (target dates for all long term goals: 10/23/2022    1. Patient will demonstrate/report pain at worst less than or equal to 2/10 to facilitate minimal limitation in daily activity secondary to pain symptoms. Goal status: New   2. Patient will demonstrate independent use of home exercise program to facilitate ability to maintain/progress functional gains from skilled physical therapy services. Goal status: New   3. Patient will demonstrate FOTO outcome > or = 53 % to indicate reduced disability due to condition. Goal status: New   4.  Patient will demonstrate Lt LE MMT 5/5 throughout to faciltiate usual transfers, stairs, squatting at [facility] for daily life.  Goal status: New   5.  Patient will demonstrate Lt knee AROM 0-110 deg to facilitate transfers, ambulation, bending for daily activity at PLOF s limitation.  Goal status: New   6.  Improve 5x STS to < 20 sec without UE support for improved functional strength Goal status: New  PLAN:  PT FREQUENCY: 1-2x/week  PT DURATION: 6 weeks  PLANNED INTERVENTIONS: Therapeutic exercises, Therapeutic activity, Neuro Muscular re-education, Balance training, Gait training, Patient/Family education, Joint mobilization, Stair training, DME instructions, Dry Needling, Electrical stimulation, Traction, Cryotherapy, vasopneumatic deviceMoist heat, Taping, Ultrasound, Ionotophoresis 4mg /ml Dexamethasone, and aquatic therapy, Manual therapy.  All included unless contraindicated  PLAN FOR NEXT SESSION: PWB until 09/21/22.  SLR supine or seated for  home program next visit if good response from today.    Scot Jun, PT, DPT, OCS, ATC 09/14/22  10:45 AM

## 2022-09-17 ENCOUNTER — Ambulatory Visit: Payer: BC Managed Care – PPO | Admitting: Rehabilitative and Restorative Service Providers"

## 2022-09-17 ENCOUNTER — Encounter: Payer: Self-pay | Admitting: Rehabilitative and Restorative Service Providers"

## 2022-09-17 DIAGNOSIS — R2689 Other abnormalities of gait and mobility: Secondary | ICD-10-CM

## 2022-09-17 DIAGNOSIS — M25562 Pain in left knee: Secondary | ICD-10-CM

## 2022-09-17 DIAGNOSIS — M6281 Muscle weakness (generalized): Secondary | ICD-10-CM | POA: Diagnosis not present

## 2022-09-17 DIAGNOSIS — R600 Localized edema: Secondary | ICD-10-CM | POA: Diagnosis not present

## 2022-09-17 DIAGNOSIS — M25662 Stiffness of left knee, not elsewhere classified: Secondary | ICD-10-CM | POA: Diagnosis not present

## 2022-09-17 NOTE — Therapy (Signed)
OUTPATIENT PHYSICAL THERAPY TREATMENT   Patient Name: Christine Barrett MRN: [id number] DOB:06/03/1961, 62 y.o., female Today's Date: 09/17/2022  END OF SESSION:  PT End of Session - 09/17/22 0934     Visit Number 3    Number of Visits 12    Date for PT Re-Evaluation 10/23/22    Authorization Type [employer] $30 copay, 90 visit limit    PT Start Time 0926    PT Stop Time 1006    PT Time Calculation (min) 40 min    Activity Tolerance Patient tolerated treatment well    Behavior During Therapy [facility] for tasks assessed/performed               Past Medical History:  Diagnosis Date   Allergy    SEASONAL   Anemia    CAD S/P percutaneous coronary angioplasty 09/2013   a) Ostial AV G Cx - 2.5 mm Angiosculpt; mid LAD 40-60%; b) Myoview 07/2014: LOW RISK, small-severe fixed inferior defect c/w infarct w/o peri-infarct ischemia.   Chronic bronchitis ([place])    "frequently; not q yr" (10/03/2013)   Diabetes mellitus without complication (HCC)    Type 2-diet controlled.    Diverticulosis    Essential hypertension    with prior Accelerated HTN   GERD (gastroesophageal reflux disease)    Hiatal hernia    Hx of non-ST elevation myocardial infarction (NSTEMI) 10/01/2013   Due to Accelerated HTN with existing CAD   Hyperlipemia    Migraine    "@ least once/month" (10/03/2013)   OSA (obstructive sleep apnea) 06/10/2016   Schatzki's ring    Seasonal allergies    Sinusitis    Spinal headache    during C-section of second child.    Past Surgical History:  Procedure Laterality Date   ABDOMINAL HYSTERECTOMY  1994   "partial"   CAPSULAR RELEASE Right 06/06/2020   Procedure: RIGHT SHOULDER MANIPULATION UNDER ANESTHESIA, ROTATOR CUFF REPAIR;  Surgeon: Meredith Pel, MD;  Location: [place];  Service: Orthopedics;  Laterality: Right;   CARDIAC CATHETERIZATION N/A 08/05/2015   Procedure: Left Heart Cath and Coronary Angiography;  Surgeon: [name], MD;  Location: [place] CV LAB;  Service:  Cardiovascular;  Laterality: N/A;   CESAREAN SECTION  1989   COLONOSCOPY     CORONARY ANGIOPLASTY  10/03/2013   95% ostial AV G Cx - 2.5 mm AngioSculpt Balloon PTCA; mid LAD 40-60%   LEFT HEART CATHETERIZATION WITH CORONARY ANGIOGRAM N/A 10/02/2013   Procedure: LEFT HEART CATHETERIZATION WITH CORONARY ANGIOGRAM;  Surgeon: [name], MD;  Location: [facility] CATH LAB;  Service: Cardiovascular;  Laterality: N/A;   NASAL SEPTOPLASTY W/ TURBINOPLASTY  ~ 2007   NM MYOVIEW LTD  08/22/2014    Low risk stress nuclear study with a small, severe, fixed defect in the distal inferior wall/apex suggestive of small prior infarct; no ischemia.  LV Wall Motion:  NL LV Function; NL Wall Motion   PERCUTANEOUS CORONARY STENT INTERVENTION (PCI-S) N/A 10/03/2013   cutting balloon angioplasty only no stent.    SINOSCOPY     TRANSTHORACIC ECHOCARDIOGRAM  10/02/2013   EF 55-60%; mild LVH, no RWMA,    TUBAL LIGATION  1989   Patient Active Problem List   Diagnosis Date Noted   Coronary artery disease involving native heart with angina pectoris, unspecified vessel or lesion type ([place]) 10/29/2021   Perineal irritation in female 08/15/2021   Dysuria 08/15/2021   Plantar fasciitis of right foot 07/04/2021   Trochanteric bursitis of left hip  04/08/2021   Pain of left middle finger 04/08/2021   Other allergic rhinitis 01/01/2021   Frequent episodes of sinusitis 01/01/2021   History of bronchitis 01/01/2021   Allergic conjunctivitis of both eyes 01/01/2021   Family history of breast cancer in sister 09/20/2020   Type 2 diabetes mellitus with other circulatory complications, HTN, CVD ([place]) 05/17/2020   Morbid obesity ([place]) 05/17/2020   BMI 40.0-44.9, adult ([place]) 05/17/2020   Acute non-recurrent maxillary sinusitis 11/04/2016   Acute pain of both knees 08/28/2016   OSA (obstructive sleep apnea) 06/10/2016   Costochondritis 06/09/2016   Bilateral leg cramps 02/07/2016   Peripheral edema 01/29/2016   Hypertension  associated with diabetes ([place])    GERD (gastroesophageal reflux disease) 08/10/2014   Menopausal syndrome 08/10/2014   CAD S/P percutaneous coronary angioplasty - Ostial AVG Cx - Scoring balloon PTCA 10/03/2013   Hyperlipidemia with target LDL less than 70 10/02/2013   H/O non-ST elevation myocardial infarction (NSTEMI) 10/02/2013   Normocytic anemia, not due to blood loss    Allergic sinusitis 03/17/2007   MIGRAINE, COMMON W/O INTRACTABLE MIGRAINE 03/16/2007    PCP: Jinny Sanders MD  REFERRING PROVIDER: Meredith Pel, MD  REFERRING DIAG: [phone] (ICD-10-CM) - Complex tear of lateral meniscus of left knee as current injury, subsequent encounter M25.562 (ICD-10-CM) - Left knee pain, unspecified chronicity M25.462 (ICD-10-CM) - Effusion, left knee  THERAPY DIAG:  Acute pain of left knee  Localized edema  Stiffness of left knee, not elsewhere classified  Muscle weakness (generalized)  Other abnormalities of gait and mobility  Rationale for Evaluation and Treatment: Rehabilitation  ONSET DATE: Surgery 08/10/2022  SUBJECTIVE:   SUBJECTIVE STATEMENT: She indicated no pain upon arrival today and did ok after last visit.  She reported a point tenderness "knot" in lower lateral Lt shin that was only painful to touch, new in last couple days.    PERTINENT HISTORY: Anemia, CAD, DM, HTN, HLD, hx NSTEMI, hx migraines  PAIN:  NPRS scale: 0 currently, up to 3/10 Pain location: Lt knee  Pain description: sore Aggravating factors: bending knee, prolonged positioning, standing/walking Relieving factors: repositioning, ice  PRECAUTIONS: Meniscus repair.   WEIGHT BEARING RESTRICTIONS: PWB for 2 weeks (ending 09/21/2022 per MD note then FWB after)  FALLS:  Has patient fallen in last 6 months? No  LIVING ENVIRONMENT: Lives with: lives alone and currently living at son's house (1 step to enter his home, and staying downstairs) Lives in: House/apartment Stairs: No Has  following equipment at home: Single point cane and Std W  OCCUPATION: Programme researcher, broadcasting/film/video; currently not working  PLOF: Independent; spend time with grandchildren, go out with friends; travel, no regular exercise  PATIENT GOALS: improve knee function, walk without device  Next MD visit: 10/05/22   OBJECTIVE:   PATIENT SURVEYS:  09/11/22: FOTO 29 (predicted 24)  COGNITION: Overall cognitive status: WFL   EDEMA:  09/11/22: not formally measured but Lt knee and lower leg edema present  POSTURE: rounded shoulders and forward head  LOWER EXTREMITY ROM:   ROM Right eval Left eval Left 09/14/2022  Knee flexion  A: 87 P:90 AROM in supine heel slide: 102  Knee extension  A: -5 (LAQ) P: 0    (Blank rows = not tested)  LOWER EXTREMITY MMT:  Deferred formal testing; Lt knee grossly 3-/5  MMT Right eval Left eval  Hip flexion    Hip extension    Hip abduction    Hip adduction    Hip internal  rotation    Hip external rotation    Knee flexion    Knee extension    Ankle dorsiflexion    Ankle plantarflexion    Ankle inversion    Ankle eversion     (Blank rows = not tested)  FUNCTIONAL TESTS:  09/11/22:  5x STS: 24.12 sec with UE support needed  GAIT: 09/11/22 Distance walked: 100' Assistive device utilized:  Std W Level of assistance: Modified independence Comments: antalgic gait; decreased velocity   TODAY'S TREATMENT:                                                 DATE:09/17/2022 Therex: Nustep Lvl 5 UE/LE 10 mins Seated quad set Lt 5 sec hold x 10  Seated quad set Lt c SLR 2 x 5  Seated Lt knee LAQ c pauses in end range 2.5 lbs 3 x 10   TODAY'S TREATMENT:                                                 DATE:09/14/22 Therex: Nustep Lvl 5 UE/LE 10 mins Seated Lt knee LAQ c pauses in end range x 15  Seated quad set Lt 5 sec hold x 10  Supine Lt knee AROM heel slide c quad set combo 5 sec hold each way x 10  Supine LAQ x 10 (cues for home for swelling  reduction) Supine Lt SLR x 10     TODAY'S TREATMENT:                                                 DATE:09/11/22 Therex:    HEP instruction/performance c cues for techniques, handout provided.  Trial set performed of each for comprehension and symptom assessment.  See below for exercise list  PATIENT EDUCATION:  09/17/2022 Education details: HEP update Person educated: Patient Education method: Explanation, Demonstration, Verbal cues, and Handouts Education comprehension: verbalized understanding, returned demonstration, and verbal cues required  HOME EXERCISE PROGRAM: Access Code: [email or web address] URL: [email or web address] Date: 09/17/2022 Prepared by: Scot Jun  Exercises - Supine Heel Slide with Strap  - 5-10 x daily - 7 x weekly - 1 sets - 5-10 reps - Seated Knee Extension AROM (Mirrored)  - 3-5 x daily - 7 x weekly - 1-2 sets - 10 reps - 5 sec hold - Seated Knee Flexion Stretch (Mirrored)  - 3-5 x daily - 7 x weekly - 1-2 sets - 10 reps - 5 sec hold - Seated Quad Set (Mirrored)  - 3-5 x daily - 7 x weekly - 1 sets - 10 reps - 5 hold - Supine 90/90 Sciatic Nerve Glide with Knee Flexion/Extension  - 1-2 x daily - 7 x weekly - 1-2 sets - 10 reps - Seated Straight Leg Heel Taps  - 1-2 x daily - 7 x weekly - 3 sets - 10 reps  ASSESSMENT:  CLINICAL IMPRESSION: Cyst like nodule noted in lower 1/3 lateral Lt lower leg with tenderness to touch, no warmth or redness and not in calf musculature.  No pain evident from area during intervention in  clinic.  Instruction to monitor and report upon return next visit.    Improvement in quad activation and SLR ability noted today.  Continued skilled PT services warranted at this time.    OBJECTIVE IMPAIRMENTS: Abnormal gait, decreased activity tolerance, decreased balance, decreased endurance, decreased knowledge of use of DME, decreased mobility, difficulty walking, decreased ROM, decreased strength, increased edema, increased  fascial restrictions, and pain.   ACTIVITY LIMITATIONS: carrying, lifting, bending, sitting, standing, squatting, sleeping, stairs, transfers, bed mobility, and locomotion level  PARTICIPATION LIMITATIONS: meal prep, cleaning, laundry, driving, shopping, community activity, and occupation  PERSONAL FACTORS: 3+ comorbidities: Anemia, CAD, DM, HTN, HLD, hx NSTEMI, hx migraines  are also affecting patient's functional outcome.   REHAB POTENTIAL: Good  CLINICAL DECISION MAKING: Stable/uncomplicated  EVALUATION COMPLEXITY: Low   GOALS: Goals reviewed with patient? Yes  SHORT TERM GOALS: (target date for Short term goals are 3 weeks 10/02/2022)   1.  Patient will demonstrate independent use of home exercise program to maintain progress from in clinic treatments.  Goal status: met  LONG TERM GOALS: (target dates for all long term goals: 10/23/2022    1. Patient will demonstrate/report pain at worst less than or equal to 2/10 to facilitate minimal limitation in daily activity secondary to pain symptoms. Goal status: New   2. Patient will demonstrate independent use of home exercise program to facilitate ability to maintain/progress functional gains from skilled physical therapy services. Goal status: New   3. Patient will demonstrate FOTO outcome > or = 53 % to indicate reduced disability due to condition. Goal status: New   4.  Patient will demonstrate Lt LE MMT 5/5 throughout to faciltiate usual transfers, stairs, squatting at [facility] for daily life.  Goal status: New   5.  Patient will demonstrate Lt knee AROM 0-110 deg to facilitate transfers, ambulation, bending for daily activity at PLOF s limitation.  Goal status: New   6.  Improve 5x STS to < 20 sec without UE support for improved functional strength Goal status: New  PLAN:  PT FREQUENCY: 1-2x/week  PT DURATION: 6 weeks  PLANNED INTERVENTIONS: Therapeutic exercises, Therapeutic activity, Neuro Muscular re-education,  Balance training, Gait training, Patient/Family education, Joint mobilization, Stair training, DME instructions, Dry Needling, Electrical stimulation, Traction, Cryotherapy, vasopneumatic deviceMoist heat, Taping, Ultrasound, Ionotophoresis 4mg /ml Dexamethasone, and aquatic therapy, Manual therapy.  All included unless contraindicated  PLAN FOR NEXT SESSION: PWB until 09/21/2022.  Continued quad strengthening.  Introduce static balance and SPC ambulation trials on 09/21/2022   Scot Jun, PT, DPT, OCS, ATC 09/17/22  10:23 AM

## 2022-09-21 ENCOUNTER — Encounter: Payer: Self-pay | Admitting: Rehabilitative and Restorative Service Providers"

## 2022-09-21 ENCOUNTER — Ambulatory Visit: Payer: BC Managed Care – PPO | Admitting: Rehabilitative and Restorative Service Providers"

## 2022-09-21 DIAGNOSIS — M25562 Pain in left knee: Secondary | ICD-10-CM

## 2022-09-21 DIAGNOSIS — M6281 Muscle weakness (generalized): Secondary | ICD-10-CM | POA: Diagnosis not present

## 2022-09-21 DIAGNOSIS — M25662 Stiffness of left knee, not elsewhere classified: Secondary | ICD-10-CM | POA: Diagnosis not present

## 2022-09-21 DIAGNOSIS — R600 Localized edema: Secondary | ICD-10-CM

## 2022-09-21 DIAGNOSIS — R2689 Other abnormalities of gait and mobility: Secondary | ICD-10-CM

## 2022-09-21 NOTE — Therapy (Signed)
OUTPATIENT PHYSICAL THERAPY TREATMENT   Patient Name: Christine Barrett MRN: [id number] DOB:[birth year], 62 y.o., female Today's Date: 09/21/2022  END OF SESSION:  PT End of Session - 09/21/22 0846     Visit Number 4    Number of Visits 12    Date for PT Re-Evaluation 10/23/22    Authorization Type [employer] $30 copay, 90 visit limit    PT Start Time 0837    PT Stop Time 0917    PT Time Calculation (min) 40 min    Activity Tolerance Patient tolerated treatment well    Behavior During Therapy [facility] for tasks assessed/performed                Past Medical History:  Diagnosis Date   Allergy    SEASONAL   Anemia    CAD S/P percutaneous coronary angioplasty 09/2013   a) Ostial AV G Cx - 2.5 mm Angiosculpt; mid LAD 40-60%; b) Myoview 07/2014: LOW RISK, small-severe fixed inferior defect c/w infarct w/o peri-infarct ischemia.   Chronic bronchitis ([place])    "frequently; not q yr" (10/03/2013)   Diabetes mellitus without complication (HCC)    Type 2-diet controlled.    Diverticulosis    Essential hypertension    with prior Accelerated HTN   GERD (gastroesophageal reflux disease)    Hiatal hernia    Hx of non-ST elevation myocardial infarction (NSTEMI) 10/01/2013   Due to Accelerated HTN with existing CAD   Hyperlipemia    Migraine    "@ least once/month" (10/03/2013)   OSA (obstructive sleep apnea) 06/10/2016   Schatzki's ring    Seasonal allergies    Sinusitis    Spinal headache    during C-section of second child.    Past Surgical History:  Procedure Laterality Date   ABDOMINAL HYSTERECTOMY  1994   "partial"   CAPSULAR RELEASE Right 06/06/2020   Procedure: RIGHT SHOULDER MANIPULATION UNDER ANESTHESIA, ROTATOR CUFF REPAIR;  Surgeon: Meredith Pel, MD;  Location: [place];  Service: Orthopedics;  Laterality: Right;   CARDIAC CATHETERIZATION N/A 08/05/2015   Procedure: Left Heart Cath and Coronary Angiography;  Surgeon: [name], MD;  Location: [place] CV LAB;  Service:  Cardiovascular;  Laterality: N/A;   CESAREAN SECTION  1989   COLONOSCOPY     CORONARY ANGIOPLASTY  10/03/2013   95% ostial AV G Cx - 2.5 mm AngioSculpt Balloon PTCA; mid LAD 40-60%   LEFT HEART CATHETERIZATION WITH CORONARY ANGIOGRAM N/A 10/02/2013   Procedure: LEFT HEART CATHETERIZATION WITH CORONARY ANGIOGRAM;  Surgeon: [name], MD;  Location: [facility] CATH LAB;  Service: Cardiovascular;  Laterality: N/A;   NASAL SEPTOPLASTY W/ TURBINOPLASTY  ~ 2007   NM MYOVIEW LTD  08/22/2014    Low risk stress nuclear study with a small, severe, fixed defect in the distal inferior wall/apex suggestive of small prior infarct; no ischemia.  LV Wall Motion:  NL LV Function; NL Wall Motion   PERCUTANEOUS CORONARY STENT INTERVENTION (PCI-S) N/A 10/03/2013   cutting balloon angioplasty only no stent.    SINOSCOPY     TRANSTHORACIC ECHOCARDIOGRAM  10/02/2013   EF 55-60%; mild LVH, no RWMA,    TUBAL LIGATION  1989   Patient Active Problem List   Diagnosis Date Noted   Coronary artery disease involving native heart with angina pectoris, unspecified vessel or lesion type ([place]) 10/29/2021   Perineal irritation in female 08/15/2021   Dysuria 08/15/2021   Plantar fasciitis of right foot 07/04/2021   Trochanteric bursitis of left  hip 04/08/2021   Pain of left middle finger 04/08/2021   Other allergic rhinitis 01/01/2021   Frequent episodes of sinusitis 01/01/2021   History of bronchitis 01/01/2021   Allergic conjunctivitis of both eyes 01/01/2021   Family history of breast cancer in sister 09/20/2020   Type 2 diabetes mellitus with other circulatory complications, HTN, CVD ([place]) 05/17/2020   Morbid obesity ([place]) 05/17/2020   BMI 40.0-44.9, adult ([place]) 05/17/2020   Acute non-recurrent maxillary sinusitis 11/04/2016   Acute pain of both knees 08/28/2016   OSA (obstructive sleep apnea) 06/10/2016   Costochondritis 06/09/2016   Bilateral leg cramps 02/07/2016   Peripheral edema 01/29/2016   Hypertension  associated with diabetes ([place])    GERD (gastroesophageal reflux disease) 08/10/2014   Menopausal syndrome 08/10/2014   CAD S/P percutaneous coronary angioplasty - Ostial AVG Cx - Scoring balloon PTCA 10/03/2013   Hyperlipidemia with target LDL less than 70 10/02/2013   H/O non-ST elevation myocardial infarction (NSTEMI) 10/02/2013   Normocytic anemia, not due to blood loss    Allergic sinusitis 03/17/2007   MIGRAINE, COMMON W/O INTRACTABLE MIGRAINE 03/16/2007    PCP: Jinny Sanders MD  REFERRING PROVIDER: Meredith Pel, MD  REFERRING DIAG: [phone] (ICD-10-CM) - Complex tear of lateral meniscus of left knee as current injury, subsequent encounter M25.562 (ICD-10-CM) - Left knee pain, unspecified chronicity M25.462 (ICD-10-CM) - Effusion, left knee  THERAPY DIAG:  Acute pain of left knee  Localized edema  Stiffness of left knee, not elsewhere classified  Muscle weakness (generalized)  Other abnormalities of gait and mobility  Rationale for Evaluation and Treatment: Rehabilitation  ONSET DATE: Surgery 08/10/2022  SUBJECTIVE:   SUBJECTIVE STATEMENT: She indicated she did well over the weekend "trying to do what I should."  She reported some short distance walking with cane at times from weekend.  No pain upon arrival.    PERTINENT HISTORY: Anemia, CAD, DM, HTN, HLD, hx NSTEMI, hx migraines  PAIN:  NPRS scale: 0 currently, up to 3/10 Pain location: Lt knee  Pain description: sore Aggravating factors: bending knee, prolonged positioning, standing/walking Relieving factors: repositioning, ice  PRECAUTIONS: Meniscus repair.   WEIGHT BEARING RESTRICTIONS: PWB for 2 weeks (ending 09/21/2022 per MD note then FWB after)  FALLS:  Has patient fallen in last 6 months? No  LIVING ENVIRONMENT: Lives with: lives alone and currently living at son's house (1 step to enter his home, and staying downstairs) Lives in: House/apartment Stairs: No Has following equipment at  home: Single point cane and Std W  OCCUPATION: Programme researcher, broadcasting/film/video; currently not working  PLOF: Independent; spend time with grandchildren, go out with friends; travel, no regular exercise  PATIENT GOALS: improve knee function, walk without device  Next MD visit: 10/05/22   OBJECTIVE:   PATIENT SURVEYS:  09/11/22: FOTO 29 (predicted 11)  COGNITION: Overall cognitive status: WFL   EDEMA:  09/11/22: not formally measured but Lt knee and lower leg edema present  POSTURE: rounded shoulders and forward head  LOWER EXTREMITY ROM:   ROM Right eval Left eval Left 09/14/2022  Knee flexion  A: 87 P:90 AROM in supine heel slide: 102  Knee extension  A: -5 (LAQ) P: 0    (Blank rows = not tested)  LOWER EXTREMITY MMT:  Eval: Deferred formal testing; Lt knee grossly 3-/5  MMT Right 09/21/2022 Left 09/21/2022  Hip flexion 5/5 5/5  Hip extension    Hip abduction    Hip adduction    Hip internal rotation  Hip external rotation    Knee flexion 5/5 5/5  Knee extension 5/5 4/5  Ankle dorsiflexion    Ankle plantarflexion    Ankle inversion    Ankle eversion     (Blank rows = not tested)  FUNCTIONAL TESTS:  09/11/22:  5x STS: 24.12 sec with UE support needed  GAIT: 09/21/2022:  SPC use in Rt UE in clinic level surfaces < 150 ft with supervision.   09/11/22 Distance walked: 100' Assistive device utilized:  Std W Level of assistance: Modified independence Comments: antalgic gait; decreased velocity   TODAY'S TREATMENT:                                                 DATE:09/21/2022 Therex: Nustep Lvl 6 UE/LE 10 mins Leg press Double leg 75 lbs x 15 , single Lt only 31 lbs x 15 Incline gastroc stretch bilateral 30 sec x 3  Seated LAQ 5 lbs 3 sec hold in extension x 15   TherActivity SPC use within clinic c verbal instruction of sequencing and supervision.  Performed various household distances between activity in clinic < 150 ft each time. Sit to stand to sit 20  inch table s UE assist x 10 (cues for home use)  Neuro Re-ed: Tandem stance 1 min x 1 bilateral c instruction  TODAY'S TREATMENT:                                                 DATE:09/17/2022 Therex: Nustep Lvl 5 UE/LE 10 mins Seated quad set Lt 5 sec hold x 10  Seated quad set Lt c SLR 2 x 5  Seated Lt knee LAQ c pauses in end range 2.5 lbs 3 x 10   TODAY'S TREATMENT:                                                 DATE:09/14/22 Therex: Nustep Lvl 5 UE/LE 10 mins Seated Lt knee LAQ c pauses in end range x 15  Seated quad set Lt 5 sec hold x 10  Supine Lt knee AROM heel slide c quad set combo 5 sec hold each way x 10  Supine LAQ x 10 (cues for home for swelling reduction) Supine Lt SLR x 10    PATIENT EDUCATION:  09/17/2022 Education details: HEP update Person educated: Patient Education method: Consulting civil engineer, Demonstration, Verbal cues, and Handouts Education comprehension: verbalized understanding, returned demonstration, and verbal cues required  HOME EXERCISE PROGRAM: Access Code: [email or web address] URL: [email or web address] Date: 09/17/2022 Prepared by: Scot Jun  Exercises - Supine Heel Slide with Strap  - 5-10 x daily - 7 x weekly - 1 sets - 5-10 reps - Seated Knee Extension AROM (Mirrored)  - 3-5 x daily - 7 x weekly - 1-2 sets - 10 reps - 5 sec hold - Seated Knee Flexion Stretch (Mirrored)  - 3-5 x daily - 7 x weekly - 1-2 sets - 10 reps - 5 sec hold - Seated Quad Set (Mirrored)  - 3-5 x daily - 7 x weekly - 1 sets - 10 reps - 5  hold - Supine 90/90 Sciatic Nerve Glide with Knee Flexion/Extension  - 1-2 x daily - 7 x weekly - 1-2 sets - 10 reps - Seated Straight Leg Heel Taps  - 1-2 x daily - 7 x weekly - 3 sets - 10 reps  ASSESSMENT:  CLINICAL IMPRESSION: Introduction of WB activity with good overall tolerance.  Pt was safe with household distance level surface ambulation c SPC but with noted reduced gait speed (mixture of step to and step through pattern  at times).  Continued skilled PT services warranted to progress strength and balance.    OBJECTIVE IMPAIRMENTS: Abnormal gait, decreased activity tolerance, decreased balance, decreased endurance, decreased knowledge of use of DME, decreased mobility, difficulty walking, decreased ROM, decreased strength, increased edema, increased fascial restrictions, and pain.   ACTIVITY LIMITATIONS: carrying, lifting, bending, sitting, standing, squatting, sleeping, stairs, transfers, bed mobility, and locomotion level  PARTICIPATION LIMITATIONS: meal prep, cleaning, laundry, driving, shopping, community activity, and occupation  PERSONAL FACTORS: 3+ comorbidities: Anemia, CAD, DM, HTN, HLD, hx NSTEMI, hx migraines  are also affecting patient's functional outcome.   REHAB POTENTIAL: Good  CLINICAL DECISION MAKING: Stable/uncomplicated  EVALUATION COMPLEXITY: Low   GOALS: Goals reviewed with patient? Yes  SHORT TERM GOALS: (target date for Short term goals are 3 weeks 10/02/2022)   1.  Patient will demonstrate independent use of home exercise program to maintain progress from in clinic treatments.  Goal status: met  LONG TERM GOALS: (target dates for all long term goals: 10/23/2022    1. Patient will demonstrate/report pain at worst less than or equal to 2/10 to facilitate minimal limitation in daily activity secondary to pain symptoms. Goal status: on going 09/21/2022   2. Patient will demonstrate independent use of home exercise program to facilitate ability to maintain/progress functional gains from skilled physical therapy services. Goal status: on going 09/21/2022   3. Patient will demonstrate FOTO outcome > or = 53 % to indicate reduced disability due to condition. Goal status: on going 09/21/2022   4.  Patient will demonstrate Lt LE MMT 5/5 throughout to faciltiate usual transfers, stairs, squatting at [facility] for daily life.  Goal status: on going 09/21/2022   5.  Patient will demonstrate  Lt knee AROM 0-110 deg to facilitate transfers, ambulation, bending for daily activity at PLOF s limitation.  Goal status: on going 09/21/2022   6.  Improve 5x STS to < 20 sec without UE support for improved functional strength Goal status: on going 09/21/2022  PLAN:  PT FREQUENCY: 1-2x/week  PT DURATION: 6 weeks  PLANNED INTERVENTIONS: Therapeutic exercises, Therapeutic activity, Neuro Muscular re-education, Balance training, Gait training, Patient/Family education, Joint mobilization, Stair training, DME instructions, Dry Needling, Electrical stimulation, Traction, Cryotherapy, vasopneumatic deviceMoist heat, Taping, Ultrasound, Ionotophoresis 4mg /ml Dexamethasone, and aquatic therapy, Manual therapy.  All included unless contraindicated  PLAN FOR NEXT SESSION: No more WB restriction as of 09/21/2022 per MD note previously reviewed.    Scot Jun, PT, DPT, OCS, ATC 09/21/22  9:15 AM

## 2022-09-23 ENCOUNTER — Ambulatory Visit: Payer: BC Managed Care – PPO | Admitting: Physical Therapy

## 2022-09-23 ENCOUNTER — Encounter: Payer: Self-pay | Admitting: Physical Therapy

## 2022-09-23 DIAGNOSIS — M25562 Pain in left knee: Secondary | ICD-10-CM

## 2022-09-23 DIAGNOSIS — M25662 Stiffness of left knee, not elsewhere classified: Secondary | ICD-10-CM | POA: Diagnosis not present

## 2022-09-23 DIAGNOSIS — M6281 Muscle weakness (generalized): Secondary | ICD-10-CM

## 2022-09-23 DIAGNOSIS — R2689 Other abnormalities of gait and mobility: Secondary | ICD-10-CM

## 2022-09-23 DIAGNOSIS — R600 Localized edema: Secondary | ICD-10-CM | POA: Diagnosis not present

## 2022-09-23 NOTE — Therapy (Signed)
OUTPATIENT PHYSICAL THERAPY TREATMENT   Patient Name: Geanine Kurtzer MRN: [id number] DOB:08/18/1960, 62 y.o., female Today's Date: 09/23/2022  END OF SESSION:  PT End of Session - 09/23/22 0821     Visit Number 5    Number of Visits 12    Date for PT Re-Evaluation 10/23/22    Authorization Type [employer] $30 copay, 90 visit limit    PT Start Time 0800    PT Stop Time 0841    PT Time Calculation (min) 41 min    Activity Tolerance Patient tolerated treatment well    Behavior During Therapy [facility] for tasks assessed/performed                 Past Medical History:  Diagnosis Date   Allergy    SEASONAL   Anemia    CAD S/P percutaneous coronary angioplasty 09/2013   a) Ostial AV G Cx - 2.5 mm Angiosculpt; mid LAD 40-60%; b) Myoview 07/2014: LOW RISK, small-severe fixed inferior defect c/w infarct w/o peri-infarct ischemia.   Chronic bronchitis ([place])    "frequently; not q yr" (10/03/2013)   Diabetes mellitus without complication (HCC)    Type 2-diet controlled.    Diverticulosis    Essential hypertension    with prior Accelerated HTN   GERD (gastroesophageal reflux disease)    Hiatal hernia    Hx of non-ST elevation myocardial infarction (NSTEMI) 10/01/2013   Due to Accelerated HTN with existing CAD   Hyperlipemia    Migraine    "@ least once/month" (10/03/2013)   OSA (obstructive sleep apnea) 06/10/2016   Schatzki's ring    Seasonal allergies    Sinusitis    Spinal headache    during C-section of second child.    Past Surgical History:  Procedure Laterality Date   ABDOMINAL HYSTERECTOMY  1994   "partial"   CAPSULAR RELEASE Right 06/06/2020   Procedure: RIGHT SHOULDER MANIPULATION UNDER ANESTHESIA, ROTATOR CUFF REPAIR;  Surgeon: Meredith Pel, MD;  Location: [place];  Service: Orthopedics;  Laterality: Right;   CARDIAC CATHETERIZATION N/A 08/05/2015   Procedure: Left Heart Cath and Coronary Angiography;  Surgeon: [name], MD;  Location: [place] CV LAB;   Service: Cardiovascular;  Laterality: N/A;   CESAREAN SECTION  1989   COLONOSCOPY     CORONARY ANGIOPLASTY  10/03/2013   95% ostial AV G Cx - 2.5 mm AngioSculpt Balloon PTCA; mid LAD 40-60%   LEFT HEART CATHETERIZATION WITH CORONARY ANGIOGRAM N/A 10/02/2013   Procedure: LEFT HEART CATHETERIZATION WITH CORONARY ANGIOGRAM;  Surgeon: [name], MD;  Location: [facility] CATH [facility];  Service: Cardiovascular;  Laterality: N/A;   NASAL SEPTOPLASTY W/ TURBINOPLASTY  ~ 2007   NM MYOVIEW LTD  08/22/2014    Low risk stress nuclear study with a small, severe, fixed defect in the distal inferior wall/apex suggestive of small prior infarct; no ischemia.  LV Wall Motion:  NL LV Function; NL Wall Motion   PERCUTANEOUS CORONARY STENT INTERVENTION (PCI-S) N/A 10/03/2013   cutting balloon angioplasty only no stent.    SINOSCOPY     TRANSTHORACIC ECHOCARDIOGRAM  10/02/2013   EF 55-60%; mild LVH, no RWMA,    TUBAL LIGATION  1989   Patient Active Problem List   Diagnosis Date Noted   Coronary artery disease involving native heart with angina pectoris, unspecified vessel or lesion type ([place]) 10/29/2021   Perineal irritation in female 08/15/2021   Dysuria 08/15/2021   Plantar fasciitis of right foot 07/04/2021   Trochanteric bursitis of  left hip 04/08/2021   Pain of left middle finger 04/08/2021   Other allergic rhinitis 01/01/2021   Frequent episodes of sinusitis 01/01/2021   History of bronchitis 01/01/2021   Allergic conjunctivitis of both eyes 01/01/2021   Family history of breast cancer in sister 09/20/2020   Type 2 diabetes mellitus with other circulatory complications, HTN, CVD ([place]) 05/17/2020   Morbid obesity ([place]) 05/17/2020   BMI 40.0-44.9, adult ([place]) 05/17/2020   Acute non-recurrent maxillary sinusitis 11/04/2016   Acute pain of both knees 08/28/2016   OSA (obstructive sleep apnea) 06/10/2016   Costochondritis 06/09/2016   Bilateral leg cramps 02/07/2016   Peripheral edema 01/29/2016    Hypertension associated with diabetes ([name])    GERD (gastroesophageal reflux disease) 08/10/2014   Menopausal syndrome 08/10/2014   CAD S/P percutaneous coronary angioplasty - Ostial AVG Cx - Scoring balloon PTCA 10/03/2013   Hyperlipidemia with target LDL less than 70 10/02/2013   H/O non-ST elevation myocardial infarction (NSTEMI) 10/02/2013   Normocytic anemia, not due to blood loss    Allergic sinusitis 03/17/2007   MIGRAINE, COMMON W/O INTRACTABLE MIGRAINE 03/16/2007    PCP: Jinny Sanders MD  REFERRING PROVIDER: Meredith Pel, MD  REFERRING DIAG: [phone] (ICD-10-CM) - Complex tear of lateral meniscus of left knee as current injury, subsequent encounter M25.562 (ICD-10-CM) - Left knee pain, unspecified chronicity M25.462 (ICD-10-CM) - Effusion, left knee  THERAPY DIAG:  Acute pain of left knee  Localized edema  Stiffness of left knee, not elsewhere classified  Muscle weakness (generalized)  Other abnormalities of gait and mobility  Rationale for Evaluation and Treatment: Rehabilitation  ONSET DATE: Surgery 08/10/2022  SUBJECTIVE:   SUBJECTIVE STATEMENT: Working on using her cane more; but more stiff and sore today.  No significant pain though.   PERTINENT HISTORY: Anemia, CAD, DM, HTN, HLD, hx NSTEMI, hx migraines  PAIN:  NPRS scale: 0 currently, up to 3/10 Pain location: Lt knee  Pain description: sore Aggravating factors: bending knee, prolonged positioning, standing/walking Relieving factors: repositioning, ice  PRECAUTIONS: Meniscus repair.   WEIGHT BEARING RESTRICTIONS: PWB for 2 weeks (ending 09/21/2022 per MD note then FWB after)  FALLS:  Has patient fallen in last 6 months? No  LIVING ENVIRONMENT: Lives with: lives alone and currently living at son's house (1 step to enter his home, and staying downstairs) Lives in: House/apartment Stairs: No Has following equipment at home: Single point cane and Std W  OCCUPATION: Insurance underwriter; currently not working  PLOF: Independent; spend time with grandchildren, go out with friends; travel, no regular exercise  PATIENT GOALS: improve knee function, walk without device  Next MD visit: 10/05/22   OBJECTIVE:   PATIENT SURVEYS:  09/11/22: FOTO 29 (predicted 71)  COGNITION: Overall cognitive status: WFL   EDEMA:  09/11/22: not formally measured but Lt knee and lower leg edema present  POSTURE: rounded shoulders and forward head  LOWER EXTREMITY ROM:   ROM Right eval Left eval Left 09/14/2022  Knee flexion  A: 87 P:90 AROM in supine heel slide: 102  Knee extension  A: -5 (LAQ) P: 0    (Blank rows = not tested)  LOWER EXTREMITY MMT:  Eval: Deferred formal testing; Lt knee grossly 3-/5  MMT Right 09/21/2022 Left 09/21/2022  Hip flexion 5/5 5/5  Hip extension    Hip abduction    Hip adduction    Hip internal rotation    Hip external rotation    Knee flexion 5/5 5/5  Knee extension  5/5 4/5  Ankle dorsiflexion    Ankle plantarflexion    Ankle inversion    Ankle eversion     (Blank rows = not tested)  FUNCTIONAL TESTS:  09/11/22:  5x STS: 24.12 sec with UE support needed  GAIT: 09/21/2022:  SPC use in Rt UE in clinic level surfaces < 150 ft with supervision.   09/11/22 Distance walked: 100' Assistive device utilized:  Std W Level of assistance: Modified independence Comments: antalgic gait; decreased velocity   TODAY'S TREATMENT:                                                 DATE:09/23/2022 Therex: Nustep Lvl 6 UE/LE 10 mins Leg press Double leg 75 lbs 2 x 15 , single Lt only 31 lbs 2 x 15 Tandem stand 2x30 sec bil without UE support Tandem gait in // bars with intermittent UE support x 3 lap Seated LAQ 5 lbs 3 sec hold in extension 2 x 15    TODAY'S TREATMENT:                                                 DATE:09/21/2022 Therex: Nustep Lvl 6 UE/LE 10 mins Leg press Double leg 75 lbs x 15 , single Lt only 31 lbs x 15 Incline gastroc  stretch bilateral 30 sec x 3  Seated LAQ 5 lbs 3 sec hold in extension x 15   TherActivity SPC use within clinic c verbal instruction of sequencing and supervision.  Performed various household distances between activity in clinic < 150 ft each time. Sit to stand to sit 20 inch table s UE assist x 10 (cues for home use)  Neuro Re-ed: Tandem stance 1 min x 1 bilateral c instruction  TODAY'S TREATMENT:                                                 DATE:09/17/2022 Therex: Nustep Lvl 5 UE/LE 10 mins Seated quad set Lt 5 sec hold x 10  Seated quad set Lt c SLR 2 x 5  Seated Lt knee LAQ c pauses in end range 2.5 lbs 3 x 10   TODAY'S TREATMENT:                                                 DATE:09/14/22 Therex: Nustep Lvl 5 UE/LE 10 mins Seated Lt knee LAQ c pauses in end range x 15  Seated quad set Lt 5 sec hold x 10  Supine Lt knee AROM heel slide c quad set combo 5 sec hold each way x 10  Supine LAQ x 10 (cues for home for swelling reduction) Supine Lt SLR x 10    PATIENT EDUCATION:  09/17/2022 Education details: HEP update Person educated: Patient Education method: Consulting civil engineer, Demonstration, Verbal cues, and Handouts Education comprehension: verbalized understanding, returned demonstration, and verbal cues required  HOME EXERCISE PROGRAM: Access Code: [email or web address] URL: [email or web address] Date: 09/17/2022 Prepared  by: Scot Jun  Exercises - Supine Heel Slide with Strap  - 5-10 x daily - 7 x weekly - 1 sets - 5-10 reps - Seated Knee Extension AROM (Mirrored)  - 3-5 x daily - 7 x weekly - 1-2 sets - 10 reps - 5 sec hold - Seated Knee Flexion Stretch (Mirrored)  - 3-5 x daily - 7 x weekly - 1-2 sets - 10 reps - 5 sec hold - Seated Quad Set (Mirrored)  - 3-5 x daily - 7 x weekly - 1 sets - 10 reps - 5 hold - Supine 90/90 Sciatic Nerve Glide with Knee Flexion/Extension  - 1-2 x daily - 7 x weekly - 1-2 sets - 10 reps - Seated Straight Leg Heel Taps  - 1-2 x  daily - 7 x weekly - 3 sets - 10 reps  ASSESSMENT:  CLINICAL IMPRESSION: Pt tolerated session well today with focus on closed chain activities today.  Will continue to benefit from PT to maximize function.  OBJECTIVE IMPAIRMENTS: Abnormal gait, decreased activity tolerance, decreased balance, decreased endurance, decreased knowledge of use of DME, decreased mobility, difficulty walking, decreased ROM, decreased strength, increased edema, increased fascial restrictions, and pain.   ACTIVITY LIMITATIONS: carrying, lifting, bending, sitting, standing, squatting, sleeping, stairs, transfers, bed mobility, and locomotion level  PARTICIPATION LIMITATIONS: meal prep, cleaning, laundry, driving, shopping, community activity, and occupation  PERSONAL FACTORS: 3+ comorbidities: Anemia, CAD, DM, HTN, HLD, hx NSTEMI, hx migraines  are also affecting patient's functional outcome.   REHAB POTENTIAL: Good  CLINICAL DECISION MAKING: Stable/uncomplicated  EVALUATION COMPLEXITY: Low   GOALS: Goals reviewed with patient? Yes  SHORT TERM GOALS: (target date for Short term goals are 3 weeks 10/02/2022)  1.  Patient will demonstrate independent use of home exercise program to maintain progress from in clinic treatments. Goal status: met  LONG TERM GOALS: (target dates for all long term goals: 10/23/2022    1. Patient will demonstrate/report pain at worst less than or equal to 2/10 to facilitate minimal limitation in daily activity secondary to pain symptoms. Goal status: on going 09/21/2022   2. Patient will demonstrate independent use of home exercise program to facilitate ability to maintain/progress functional gains from skilled physical therapy services. Goal status: on going 09/21/2022   3. Patient will demonstrate FOTO outcome > or = 53 % to indicate reduced disability due to condition. Goal status: on going 09/21/2022   4.  Patient will demonstrate Lt LE MMT 5/5 throughout to faciltiate usual  transfers, stairs, squatting at [facility] for daily life.  Goal status: on going 09/21/2022   5.  Patient will demonstrate Lt knee AROM 0-110 deg to facilitate transfers, ambulation, bending for daily activity at PLOF s limitation.  Goal status: on going 09/21/2022   6.  Improve 5x STS to < 20 sec without UE support for improved functional strength Goal status: on going 09/21/2022  PLAN:  PT FREQUENCY: 1-2x/week  PT DURATION: 6 weeks  PLANNED INTERVENTIONS: Therapeutic exercises, Therapeutic activity, Neuro Muscular re-education, Balance training, Gait training, Patient/Family education, Joint mobilization, Stair training, DME instructions, Dry Needling, Electrical stimulation, Traction, Cryotherapy, vasopneumatic deviceMoist heat, Taping, Ultrasound, Ionotophoresis 4mg /ml Dexamethasone, and aquatic therapy, Manual therapy.  All included unless contraindicated  PLAN FOR NEXT SESSION: check STG #1; continue strengthening exercises;  No more WB restriction as of 09/21/2022 per MD note previously reviewed.    Laureen Abrahams, PT, DPT 09/23/22 8:41 AM

## 2022-09-28 ENCOUNTER — Ambulatory Visit (INDEPENDENT_AMBULATORY_CARE_PROVIDER_SITE_OTHER): Payer: BC Managed Care – PPO | Admitting: Physical Therapy

## 2022-09-28 ENCOUNTER — Encounter: Payer: Self-pay | Admitting: Physical Therapy

## 2022-09-28 DIAGNOSIS — M25562 Pain in left knee: Secondary | ICD-10-CM

## 2022-09-28 DIAGNOSIS — M6281 Muscle weakness (generalized): Secondary | ICD-10-CM | POA: Diagnosis not present

## 2022-09-28 DIAGNOSIS — R600 Localized edema: Secondary | ICD-10-CM

## 2022-09-28 DIAGNOSIS — M25662 Stiffness of left knee, not elsewhere classified: Secondary | ICD-10-CM | POA: Diagnosis not present

## 2022-09-28 NOTE — Therapy (Signed)
OUTPATIENT PHYSICAL THERAPY TREATMENT   Patient Name: Christine Barrett MRN: [id number] DOB:04/08/1961, 62 y.o., female Today's Date: 09/28/2022  END OF SESSION:  PT End of Session - 09/28/22 1017     Visit Number 6    Number of Visits 12    Date for PT Re-Evaluation 10/23/22    Authorization Type [employer] $30 copay, 90 visit limit    PT Start Time 1013    PT Stop Time 1054    PT Time Calculation (min) 41 min    Activity Tolerance Patient tolerated treatment well    Behavior During Therapy WFL for tasks assessed/performed                  Past Medical History:  Diagnosis Date   Allergy    SEASONAL   Anemia    CAD S/P percutaneous coronary angioplasty 09/2013   a) Ostial AV G Cx - 2.5 mm Angiosculpt; mid LAD 40-60%; b) Myoview 07/2014: LOW RISK, small-severe fixed inferior defect c/w infarct w/o peri-infarct ischemia.   Chronic bronchitis    "frequently; not q yr" (10/03/2013)   Diabetes mellitus without complication    Type 2-diet controlled.    Diverticulosis    Essential hypertension    with prior Accelerated HTN   GERD (gastroesophageal reflux disease)    Hiatal hernia    Hx of non-ST elevation myocardial infarction (NSTEMI) 10/01/2013   Due to Accelerated HTN with existing CAD   Hyperlipemia    Migraine    "@ least once/month" (10/03/2013)   OSA (obstructive sleep apnea) 06/10/2016   Schatzki's ring    Seasonal allergies    Sinusitis    Spinal headache    during C-section of second child.    Past Surgical History:  Procedure Laterality Date   ABDOMINAL HYSTERECTOMY  1994   "partial"   CAPSULAR RELEASE Right 06/06/2020   Procedure: RIGHT SHOULDER MANIPULATION UNDER ANESTHESIA, ROTATOR CUFF REPAIR;  Surgeon: Meredith Pel, MD;  Location: [place];  Service: Orthopedics;  Laterality: Right;   CARDIAC CATHETERIZATION N/A 08/05/2015   Procedure: Left Heart Cath and Coronary Angiography;  Surgeon: [name], MD;  Location: [facility];  Service:  Cardiovascular;  Laterality: N/A;   CESAREAN SECTION  1989   COLONOSCOPY     CORONARY ANGIOPLASTY  10/03/2013   95% ostial AV G Cx - 2.5 mm AngioSculpt Balloon PTCA; mid LAD 40-60%   LEFT HEART CATHETERIZATION WITH CORONARY ANGIOGRAM N/A 10/02/2013   Procedure: LEFT HEART CATHETERIZATION WITH CORONARY ANGIOGRAM;  Surgeon: [name], MD;  Location: [facility];  Service: Cardiovascular;  Laterality: N/A;   NASAL SEPTOPLASTY W/ TURBINOPLASTY  ~ 2007   NM MYOVIEW LTD  08/22/2014    Low risk stress nuclear study with a small, severe, fixed defect in the distal inferior wall/apex suggestive of small prior infarct; no ischemia.  LV Wall Motion:  NL LV Function; NL Wall Motion   PERCUTANEOUS CORONARY STENT INTERVENTION (PCI-S) N/A 10/03/2013   cutting balloon angioplasty only no stent.    SINOSCOPY     TRANSTHORACIC ECHOCARDIOGRAM  10/02/2013   EF 55-60%; mild LVH, no RWMA,    TUBAL LIGATION  1989   Patient Active Problem List   Diagnosis Date Noted   Coronary artery disease involving native heart with angina pectoris, unspecified vessel or lesion type 10/29/2021   Perineal irritation in female 08/15/2021   Dysuria 08/15/2021   Plantar fasciitis of right foot 07/04/2021   Trochanteric bursitis of left hip  04/08/2021   Pain of left middle finger 04/08/2021   Other allergic rhinitis 01/01/2021   Frequent episodes of sinusitis 01/01/2021   History of bronchitis 01/01/2021   Allergic conjunctivitis of both eyes 01/01/2021   Family history of breast cancer in sister 09/20/2020   Type 2 diabetes mellitus with other circulatory complications, HTN, CVD ([place]) 05/17/2020   Morbid obesity 05/17/2020   BMI 40.0-44.9, adult 05/17/2020   Acute non-recurrent maxillary sinusitis 11/04/2016   Acute pain of both knees 08/28/2016   OSA (obstructive sleep apnea) 06/10/2016   Costochondritis 06/09/2016   Bilateral leg cramps 02/07/2016   Peripheral edema 01/29/2016   Hypertension associated with  diabetes    GERD (gastroesophageal reflux disease) 08/10/2014   Menopausal syndrome 08/10/2014   CAD S/P percutaneous coronary angioplasty - Ostial AVG Cx - Scoring balloon PTCA 10/03/2013   Hyperlipidemia with target LDL less than 70 10/02/2013   H/O non-ST elevation myocardial infarction (NSTEMI) 10/02/2013   Normocytic anemia, not due to blood loss    Allergic sinusitis 03/17/2007   MIGRAINE, COMMON W/O INTRACTABLE MIGRAINE 03/16/2007    PCP: Jinny Sanders MD  REFERRING PROVIDER: Meredith Pel, MD  REFERRING DIAG: [phone] (ICD-10-CM) - Complex tear of lateral meniscus of left knee as current injury, subsequent encounter M25.562 (ICD-10-CM) - Left knee pain, unspecified chronicity M25.462 (ICD-10-CM) - Effusion, left knee  THERAPY DIAG:  Acute pain of left knee  Localized edema  Stiffness of left knee, not elsewhere classified  Muscle weakness (generalized)  Rationale for Evaluation and Treatment: Rehabilitation  ONSET DATE: Surgery 08/10/2022  SUBJECTIVE:   SUBJECTIVE STATEMENT: When walking with cane feels a pull and more sore  PERTINENT HISTORY: Anemia, CAD, DM, HTN, HLD, hx NSTEMI, hx migraines  PAIN:  NPRS scale: 0 currently, up to 3/10 Pain location: Lt knee  Pain description: sore Aggravating factors: bending knee, prolonged positioning, standing/walking Relieving factors: repositioning, ice  PRECAUTIONS: Meniscus repair.   WEIGHT BEARING RESTRICTIONS: PWB for 2 weeks (ending 09/21/2022 per MD note then FWB after)  FALLS:  Has patient fallen in last 6 months? No  LIVING ENVIRONMENT: Lives with: lives alone and currently living at son's house (1 step to enter his home, and staying downstairs) Lives in: House/apartment Stairs: No Has following equipment at home: Single point cane and Std W  OCCUPATION: Programme researcher, broadcasting/film/video; currently not working  PLOF: Independent; spend time with grandchildren, go out with friends; travel, no  regular exercise  PATIENT GOALS: improve knee function, walk without device   OBJECTIVE:   PATIENT SURVEYS:  09/11/22: FOTO 29 (predicted 42)  COGNITION: Overall cognitive status: WFL   EDEMA:  09/11/22: not formally measured but Lt knee and lower leg edema present  POSTURE: rounded shoulders and forward head  LOWER EXTREMITY ROM:   ROM Left eval Left 09/14/2022 Left 09/28/22  Knee flexion A: 87 P:90 AROM in supine heel slide: 102 A: 111  Knee extension A: -5 (LAQ) P: 0  A: -3 (LAQ)   (Blank rows = not tested)  LOWER EXTREMITY MMT:  Eval: Deferred formal testing; Lt knee grossly 3-/5  MMT Right 09/21/2022 Left 09/21/2022  Hip flexion 5/5 5/5  Hip extension    Hip abduction    Hip adduction    Hip internal rotation    Hip external rotation    Knee flexion 5/5 5/5  Knee extension 5/5 4/5  Ankle dorsiflexion    Ankle plantarflexion    Ankle inversion    Ankle eversion     (  Blank rows = not tested)  FUNCTIONAL TESTS:  09/11/22:  5x STS: 24.12 sec with UE support needed  GAIT: 09/21/2022:  SPC use in Rt UE in clinic level surfaces < 150 ft with supervision.   09/11/22 Distance walked: 100' Assistive device utilized:  Std W Level of assistance: Modified independence Comments: antalgic gait; decreased velocity   TODAY'S TREATMENT:                                                 DATE:09/28/2022 Therex: Nustep Lvl 6 UE/LE 10 mins Leg press Double leg 100 lbs 2 x 15 , single Lt only 37 lbs 2 x 15 Seated LAQ 5 lbs 3 sec hold in extension 2 x 15  ROM measurements - see above AA heel slides with 5-10 sec hold x 15 reps  TODAY'S TREATMENT:                                                 DATE:09/23/2022 Therex: Nustep Lvl 6 UE/LE 10 mins Leg press Double leg 75 lbs 2 x 15 , single Lt only 31 lbs 2 x 15 Tandem stand 2x30 sec bil without UE support Tandem gait in // bars with intermittent UE support x 3 lap Seated LAQ 5 lbs 3 sec hold in extension 2 x 15    TODAY'S  TREATMENT:                                                 DATE:09/21/2022 Therex: Nustep Lvl 6 UE/LE 10 mins Leg press Double leg 75 lbs x 15 , single Lt only 31 lbs x 15 Incline gastroc stretch bilateral 30 sec x 3  Seated LAQ 5 lbs 3 sec hold in extension x 15   TherActivity SPC use within clinic c verbal instruction of sequencing and supervision.  Performed various household distances between activity in clinic < 150 ft each time. Sit to stand to sit 20 inch table s UE assist x 10 (cues for home use)  Neuro Re-ed: Tandem stance 1 min x 1 bilateral c instruction  PATIENT EDUCATION:  09/17/2022 Education details: HEP update Person educated: Patient Education method: Explanation, Demonstration, Verbal cues, and Handouts Education comprehension: verbalized understanding, returned demonstration, and verbal cues required  HOME EXERCISE PROGRAM: Access Code: [email or web address] URL: [email or web address] Date: 09/17/2022 Prepared by: Scot Jun  Exercises - Supine Heel Slide with Strap  - 5-10 x daily - 7 x weekly - 1 sets - 5-10 reps - Seated Knee Extension AROM (Mirrored)  - 3-5 x daily - 7 x weekly - 1-2 sets - 10 reps - 5 sec hold - Seated Knee Flexion Stretch (Mirrored)  - 3-5 x daily - 7 x weekly - 1-2 sets - 10 reps - 5 sec hold - Seated Quad Set (Mirrored)  - 3-5 x daily - 7 x weekly - 1 sets - 10 reps - 5 hold - Supine 90/90 Sciatic Nerve Glide with Knee Flexion/Extension  - 1-2 x daily - 7 x weekly - 1-2 sets - 10 reps - Seated Straight Leg Heel Taps  -  1-2 x daily - 7 x weekly - 3 sets - 10 reps  ASSESSMENT:  CLINICAL IMPRESSION: AROM improved today and overall progressing steadily with PT.  Anticipate 3-4 more weeks of PT at least to maximize function and mobility.  Still relying heavily on [facility] for ambulation so plan to work on balance and gait to begin weaning from [facility].  OBJECTIVE IMPAIRMENTS: Abnormal gait, decreased activity tolerance, decreased balance,  decreased endurance, decreased knowledge of use of DME, decreased mobility, difficulty walking, decreased ROM, decreased strength, increased edema, increased fascial restrictions, and pain.   ACTIVITY LIMITATIONS: carrying, lifting, bending, sitting, standing, squatting, sleeping, stairs, transfers, bed mobility, and locomotion level  PARTICIPATION LIMITATIONS: meal prep, cleaning, laundry, driving, shopping, community activity, and occupation  PERSONAL FACTORS: 3+ comorbidities: Anemia, CAD, DM, HTN, HLD, hx NSTEMI, hx migraines  are also affecting patient's functional outcome.   REHAB POTENTIAL: Good  CLINICAL DECISION MAKING: Stable/uncomplicated  EVALUATION COMPLEXITY: Low   GOALS: Goals reviewed with patient? Yes  SHORT TERM GOALS: (target date for Short term goals are 3 weeks 10/02/2022)  1.  Patient will demonstrate independent use of home exercise program to maintain progress from in clinic treatments. Goal status: met  LONG TERM GOALS: (target dates for all long term goals: 10/23/2022    1. Patient will demonstrate/report pain at worst less than or equal to 2/10 to facilitate minimal limitation in daily activity secondary to pain symptoms. Goal status: on going 09/21/2022   2. Patient will demonstrate independent use of home exercise program to facilitate ability to maintain/progress functional gains from skilled physical therapy services. Goal status: on going 09/21/2022   3. Patient will demonstrate FOTO outcome > or = 53 % to indicate reduced disability due to condition. Goal status: on going 09/21/2022   4.  Patient will demonstrate Lt LE MMT 5/5 throughout to faciltiate usual transfers, stairs, squatting at [facility] for daily life.  Goal status: on going 09/21/2022   5.  Patient will demonstrate Lt knee AROM 0-110 deg to facilitate transfers, ambulation, bending for daily activity at PLOF s limitation.  Goal status: on going 09/21/2022   6.  Improve 5x STS to < 20 sec  without UE support for improved functional strength Goal status: on going 09/21/2022  PLAN:  PT FREQUENCY: 1-2x/week  PT DURATION: 6 weeks  PLANNED INTERVENTIONS: Therapeutic exercises, Therapeutic activity, Neuro Muscular re-education, Balance training, Gait training, Patient/Family education, Joint mobilization, Stair training, DME instructions, Dry Needling, Electrical stimulation, Traction, Cryotherapy, vasopneumatic deviceMoist heat, Taping, Ultrasound, Ionotophoresis 4mg /ml Dexamethasone, and aquatic therapy, Manual therapy.  All included unless contraindicated  PLAN FOR NEXT SESSION: work on balance, pre gait without device;  continue strengthening exercises;  No more WB restriction as of 09/21/2022 per MD note previously reviewed.   Next MD visit: 10/05/22   Laureen Abrahams, PT, DPT 09/28/22 11:01 AM

## 2022-09-30 ENCOUNTER — Encounter: Payer: Self-pay | Admitting: Physical Therapy

## 2022-09-30 ENCOUNTER — Ambulatory Visit (INDEPENDENT_AMBULATORY_CARE_PROVIDER_SITE_OTHER): Payer: BC Managed Care – PPO | Admitting: Physical Therapy

## 2022-09-30 DIAGNOSIS — R600 Localized edema: Secondary | ICD-10-CM | POA: Diagnosis not present

## 2022-09-30 DIAGNOSIS — M25662 Stiffness of left knee, not elsewhere classified: Secondary | ICD-10-CM | POA: Diagnosis not present

## 2022-09-30 DIAGNOSIS — M6281 Muscle weakness (generalized): Secondary | ICD-10-CM | POA: Diagnosis not present

## 2022-09-30 DIAGNOSIS — R2689 Other abnormalities of gait and mobility: Secondary | ICD-10-CM

## 2022-09-30 DIAGNOSIS — M25562 Pain in left knee: Secondary | ICD-10-CM

## 2022-09-30 NOTE — Therapy (Signed)
OUTPATIENT PHYSICAL THERAPY TREATMENT   Patient Name: Aliviana Ewy MRN: [id number] DOB:11/19/1960, 62 y.o., female Today's Date: 09/30/2022  END OF SESSION:  PT End of Session - 09/30/22 0803     Visit Number 7    Number of Visits 12    Date for PT Re-Evaluation 10/23/22    Authorization Type [employer] $30 copay, 90 visit limit    PT Start Time 0800    PT Stop Time 0840    PT Time Calculation (min) 40 min    Activity Tolerance Patient tolerated treatment well    Behavior During Therapy [facility] for tasks assessed/performed                   Past Medical History:  Diagnosis Date   Allergy    SEASONAL   Anemia    CAD S/P percutaneous coronary angioplasty 09/2013   a) Ostial AV G Cx - 2.5 mm Angiosculpt; mid LAD 40-60%; b) Myoview 07/2014: LOW RISK, small-severe fixed inferior defect c/w infarct w/o peri-infarct ischemia.   Chronic bronchitis    "frequently; not q yr" (10/03/2013)   Diabetes mellitus without complication    Type 2-diet controlled.    Diverticulosis    Essential hypertension    with prior Accelerated HTN   GERD (gastroesophageal reflux disease)    Hiatal hernia    Hx of non-ST elevation myocardial infarction (NSTEMI) 10/01/2013   Due to Accelerated HTN with existing CAD   Hyperlipemia    Migraine    "@ least once/month" (10/03/2013)   OSA (obstructive sleep apnea) 06/10/2016   Schatzki's ring    Seasonal allergies    Sinusitis    Spinal headache    during C-section of second child.    Past Surgical History:  Procedure Laterality Date   ABDOMINAL HYSTERECTOMY  1994   "partial"   CAPSULAR RELEASE Right 06/06/2020   Procedure: RIGHT SHOULDER MANIPULATION UNDER ANESTHESIA, ROTATOR CUFF REPAIR;  Surgeon: Meredith Pel, MD;  Location: [facility];  Service: Orthopedics;  Laterality: Right;   CARDIAC CATHETERIZATION N/A 08/05/2015   Procedure: Left Heart Cath and Coronary Angiography;  Surgeon: [name], MD;  Location: [facility];  Service:  Cardiovascular;  Laterality: N/A;   CESAREAN SECTION  1989   COLONOSCOPY     CORONARY ANGIOPLASTY  10/03/2013   95% ostial AV G Cx - 2.5 mm AngioSculpt Balloon PTCA; mid LAD 40-60%   LEFT HEART CATHETERIZATION WITH CORONARY ANGIOGRAM N/A 10/02/2013   Procedure: LEFT HEART CATHETERIZATION WITH CORONARY ANGIOGRAM;  Surgeon: [name], MD;  Location: [facility];  Service: Cardiovascular;  Laterality: N/A;   NASAL SEPTOPLASTY W/ TURBINOPLASTY  ~ 2007   NM MYOVIEW LTD  08/22/2014    Low risk stress nuclear study with a small, severe, fixed defect in the distal inferior wall/apex suggestive of small prior infarct; no ischemia.  LV Wall Motion:  NL LV Function; NL Wall Motion   PERCUTANEOUS CORONARY STENT INTERVENTION (PCI-S) N/A 10/03/2013   cutting balloon angioplasty only no stent.    SINOSCOPY     TRANSTHORACIC ECHOCARDIOGRAM  10/02/2013   EF 55-60%; mild LVH, no RWMA,    TUBAL LIGATION  1989   Patient Active Problem List   Diagnosis Date Noted   Coronary artery disease involving native heart with angina pectoris, unspecified vessel or lesion type 10/29/2021   Perineal irritation in female 08/15/2021   Dysuria 08/15/2021   Plantar fasciitis of right foot 07/04/2021   Trochanteric bursitis of left  hip 04/08/2021   Pain of left middle finger 04/08/2021   Other allergic rhinitis 01/01/2021   Frequent episodes of sinusitis 01/01/2021   History of bronchitis 01/01/2021   Allergic conjunctivitis of both eyes 01/01/2021   Family history of breast cancer in sister 09/20/2020   Type 2 diabetes mellitus with other circulatory complications, HTN, CVD ([place]) 05/17/2020   Morbid obesity 05/17/2020   BMI 40.0-44.9, adult 05/17/2020   Acute non-recurrent maxillary sinusitis 11/04/2016   Acute pain of both knees 08/28/2016   OSA (obstructive sleep apnea) 06/10/2016   Costochondritis 06/09/2016   Bilateral leg cramps 02/07/2016   Peripheral edema 01/29/2016   Hypertension associated with  diabetes    GERD (gastroesophageal reflux disease) 08/10/2014   Menopausal syndrome 08/10/2014   CAD S/P percutaneous coronary angioplasty - Ostial AVG Cx - Scoring balloon PTCA 10/03/2013   Hyperlipidemia with target LDL less than 70 10/02/2013   H/O non-ST elevation myocardial infarction (NSTEMI) 10/02/2013   Normocytic anemia, not due to blood loss    Allergic sinusitis 03/17/2007   MIGRAINE, COMMON W/O INTRACTABLE MIGRAINE 03/16/2007    PCP: Jinny Sanders MD  REFERRING PROVIDER: Meredith Pel, MD  REFERRING DIAG: [phone] (ICD-10-CM) - Complex tear of lateral meniscus of left knee as current injury, subsequent encounter M25.562 (ICD-10-CM) - Left knee pain, unspecified chronicity M25.462 (ICD-10-CM) - Effusion, left knee  THERAPY DIAG:  Acute pain of left knee  Localized edema  Stiffness of left knee, not elsewhere classified  Muscle weakness (generalized)  Other abnormalities of gait and mobility  Rationale for Evaluation and Treatment: Rehabilitation  ONSET DATE: Surgery 08/10/2022  SUBJECTIVE:   SUBJECTIVE STATEMENT: More sore today; ended up working all day yesterday when she was supposed to work half day  PERTINENT HISTORY: Anemia, CAD, DM, HTN, HLD, hx NSTEMI, hx migraines  PAIN:  NPRS scale: 0 currently, up to 3/10 Pain location: Lt knee  Pain description: sore Aggravating factors: bending knee, prolonged positioning, standing/walking Relieving factors: repositioning, ice  PRECAUTIONS: Meniscus repair.   WEIGHT BEARING RESTRICTIONS: PWB for 2 weeks (ending 09/21/2022 per MD note then FWB after)  FALLS:  Has patient fallen in last 6 months? No  LIVING ENVIRONMENT: Lives with: lives alone and currently living at son's house (1 step to enter his home, and staying downstairs) Lives in: House/apartment Stairs: No Has following equipment at home: Single point cane and Std W  OCCUPATION: Programme researcher, broadcasting/film/video; currently not  working  PLOF: Independent; spend time with grandchildren, go out with friends; travel, no regular exercise  PATIENT GOALS: improve knee function, walk without device   OBJECTIVE:   PATIENT SURVEYS:  09/11/22: FOTO 29 (predicted 37)  COGNITION: Overall cognitive status: WFL   EDEMA:  09/11/22: not formally measured but Lt knee and lower leg edema present  POSTURE: rounded shoulders and forward head  LOWER EXTREMITY ROM:   ROM Left eval Left 09/14/2022 Left 09/28/22  Knee flexion A: 87 P:90 AROM in supine heel slide: 102 A: 111  Knee extension A: -5 (LAQ) P: 0  A: -3 (LAQ)   (Blank rows = not tested)  LOWER EXTREMITY MMT:  Eval: Deferred formal testing; Lt knee grossly 3-/5  MMT Right 09/21/2022 Left 09/21/2022  Hip flexion 5/5 5/5  Knee flexion 5/5 5/5  Knee extension 5/5 4/5   (Blank rows = not tested)  FUNCTIONAL TESTS:  09/11/22:  5x STS: 24.12 sec with UE support needed  GAIT: 09/21/2022:  SPC use in Rt UE in clinic  level surfaces < 150 ft with supervision.   09/11/22 Distance walked: 100' Assistive device utilized:  Std W Level of assistance: Modified independence Comments: antalgic gait; decreased velocity  TODAY'S TREATMENT:                                                 DATE:09/30/2022 Therex: Nustep Lvl 6 UE/LE 10 mins Leg press Double leg 100 lbs 2 x 15 , single Lt only 37 lbs 2 x 15  Neuro Re-Ed Side stepping on foam balance beam x 5 laps in // bars; intermittent UE support Tandem walking with light UE support on foam beam in // bars x 5 laps SLS activities on LLE to 3 Blaze Pods (15 sec on, 30 sec rest) x 5 cycles   TODAY'S TREATMENT:                                                 DATE:09/28/2022 Therex: Nustep Lvl 6 UE/LE 10 mins Leg press Double leg 100 lbs 2 x 15 , single Lt only 37 lbs 2 x 15 Seated LAQ 5 lbs 3 sec hold in extension 2 x 15  ROM measurements - see above AA heel slides with 5-10 sec hold x 15 reps  TODAY'S TREATMENT:                                                  DATE:09/23/2022 Therex: Nustep Lvl 6 UE/LE 10 mins Leg press Double leg 75 lbs 2 x 15 , single Lt only 31 lbs 2 x 15 Tandem stand 2x30 sec bil without UE support Tandem gait in // bars with intermittent UE support x 3 lap Seated LAQ 5 lbs 3 sec hold in extension 2 x 15    TODAY'S TREATMENT:                                                 DATE:09/21/2022 Therex: Nustep Lvl 6 UE/LE 10 mins Leg press Double leg 75 lbs x 15 , single Lt only 31 lbs x 15 Incline gastroc stretch bilateral 30 sec x 3  Seated LAQ 5 lbs 3 sec hold in extension x 15   TherActivity SPC use within clinic c verbal instruction of sequencing and supervision.  Performed various household distances between activity in clinic < 150 ft each time. Sit to stand to sit 20 inch table s UE assist x 10 (cues for home use)  Neuro Re-ed: Tandem stance 1 min x 1 bilateral c instruction  PATIENT EDUCATION:  09/17/2022 Education details: HEP update Person educated: Patient Education method: Explanation, Demonstration, Verbal cues, and Handouts Education comprehension: verbalized understanding, returned demonstration, and verbal cues required  HOME EXERCISE PROGRAM: Access Code: [email or web address] URL: [email or web address] Date: 09/17/2022 Prepared by: Scot Jun  Exercises - Supine Heel Slide with Strap  - 5-10 x daily - 7 x weekly - 1 sets - 5-10 reps - Seated Knee Extension AROM (Mirrored)  -  3-5 x daily - 7 x weekly - 1-2 sets - 10 reps - 5 sec hold - Seated Knee Flexion Stretch (Mirrored)  - 3-5 x daily - 7 x weekly - 1-2 sets - 10 reps - 5 sec hold - Seated Quad Set (Mirrored)  - 3-5 x daily - 7 x weekly - 1 sets - 10 reps - 5 hold - Supine 90/90 Sciatic Nerve Glide with Knee Flexion/Extension  - 1-2 x daily - 7 x weekly - 1-2 sets - 10 reps - Seated Straight Leg Heel Taps  - 1-2 x daily - 7 x weekly - 3 sets - 10 reps  ASSESSMENT:  CLINICAL IMPRESSION: Pt tolerated  session well today with progression of balance activities and continued strengthening.  Will continue to benefit from PT to maximize function.  OBJECTIVE IMPAIRMENTS: Abnormal gait, decreased activity tolerance, decreased balance, decreased endurance, decreased knowledge of use of DME, decreased mobility, difficulty walking, decreased ROM, decreased strength, increased edema, increased fascial restrictions, and pain.   ACTIVITY LIMITATIONS: carrying, lifting, bending, sitting, standing, squatting, sleeping, stairs, transfers, bed mobility, and locomotion level  PARTICIPATION LIMITATIONS: meal prep, cleaning, laundry, driving, shopping, community activity, and occupation  PERSONAL FACTORS: 3+ comorbidities: Anemia, CAD, DM, HTN, HLD, hx NSTEMI, hx migraines  are also affecting patient's functional outcome.   REHAB POTENTIAL: Good  CLINICAL DECISION MAKING: Stable/uncomplicated  EVALUATION COMPLEXITY: Low   GOALS: Goals reviewed with patient? Yes  SHORT TERM GOALS: (target date for Short term goals are 3 weeks 10/02/2022)  1.  Patient will demonstrate independent use of home exercise program to maintain progress from in clinic treatments. Goal status: met  LONG TERM GOALS: (target dates for all long term goals: 10/23/2022    1. Patient will demonstrate/report pain at worst less than or equal to 2/10 to facilitate minimal limitation in daily activity secondary to pain symptoms. Goal status: on going 09/21/2022   2. Patient will demonstrate independent use of home exercise program to facilitate ability to maintain/progress functional gains from skilled physical therapy services. Goal status: on going 09/21/2022   3. Patient will demonstrate FOTO outcome > or = 53 % to indicate reduced disability due to condition. Goal status: on going 09/21/2022   4.  Patient will demonstrate Lt LE MMT 5/5 throughout to faciltiate usual transfers, stairs, squatting at [facility] for daily life.  Goal status: on  going 09/21/2022   5.  Patient will demonstrate Lt knee AROM 0-110 deg to facilitate transfers, ambulation, bending for daily activity at PLOF s limitation.  Goal status: on going 09/21/2022   6.  Improve 5x STS to < 20 sec without UE support for improved functional strength Goal status: on going 09/21/2022  PLAN:  PT FREQUENCY: 1-2x/week  PT DURATION: 6 weeks  PLANNED INTERVENTIONS: Therapeutic exercises, Therapeutic activity, Neuro Muscular re-education, Balance training, Gait training, Patient/Family education, Joint mobilization, Stair training, DME instructions, Dry Needling, Electrical stimulation, Traction, Cryotherapy, vasopneumatic deviceMoist heat, Taping, Ultrasound, Ionotophoresis 4mg /ml Dexamethasone, and aquatic therapy, Manual therapy.  All included unless contraindicated  PLAN FOR NEXT SESSION: MD note and check STG,  work on balance, pre gait without device;  continue strengthening exercises  Next MD visit: 10/05/22   Laureen Abrahams, PT, DPT 09/30/22 8:42 AM

## 2022-10-01 DIAGNOSIS — Z1231 Encounter for screening mammogram for malignant neoplasm of breast: Secondary | ICD-10-CM | POA: Diagnosis not present

## 2022-10-01 LAB — HM MAMMOGRAPHY

## 2022-10-05 ENCOUNTER — Encounter: Payer: BC Managed Care – PPO | Admitting: Orthopedic Surgery

## 2022-10-05 ENCOUNTER — Ambulatory Visit (INDEPENDENT_AMBULATORY_CARE_PROVIDER_SITE_OTHER): Payer: BC Managed Care – PPO | Admitting: Physical Therapy

## 2022-10-05 ENCOUNTER — Encounter: Payer: Self-pay | Admitting: Physical Therapy

## 2022-10-05 ENCOUNTER — Ambulatory Visit (INDEPENDENT_AMBULATORY_CARE_PROVIDER_SITE_OTHER): Payer: BC Managed Care – PPO | Admitting: Surgical

## 2022-10-05 DIAGNOSIS — M25562 Pain in left knee: Secondary | ICD-10-CM | POA: Diagnosis not present

## 2022-10-05 DIAGNOSIS — R600 Localized edema: Secondary | ICD-10-CM

## 2022-10-05 DIAGNOSIS — R2689 Other abnormalities of gait and mobility: Secondary | ICD-10-CM

## 2022-10-05 DIAGNOSIS — M25662 Stiffness of left knee, not elsewhere classified: Secondary | ICD-10-CM | POA: Diagnosis not present

## 2022-10-05 DIAGNOSIS — S83272D Complex tear of lateral meniscus, current injury, left knee, subsequent encounter: Secondary | ICD-10-CM

## 2022-10-05 DIAGNOSIS — M6281 Muscle weakness (generalized): Secondary | ICD-10-CM | POA: Diagnosis not present

## 2022-10-05 NOTE — Therapy (Signed)
OUTPATIENT PHYSICAL THERAPY TREATMENT   Patient Name: Christine Barrett MRN: [id number] DOB:22-Aug-1960, 62 y.o., female Today's Date: 10/05/2022  END OF SESSION:  PT End of Session - 10/05/22 1304     Visit Number 8    Number of Visits 12    Date for PT Re-Evaluation 10/23/22    Authorization Type [employer] $30 copay, 90 visit limit    PT Start Time 1300    PT Stop Time 1340    PT Time Calculation (min) 40 min    Activity Tolerance Patient tolerated treatment well    Behavior During Therapy WFL for tasks assessed/performed                    Past Medical History:  Diagnosis Date   Allergy    SEASONAL   Anemia    CAD S/P percutaneous coronary angioplasty 09/2013   a) Ostial AV G Cx - 2.5 mm Angiosculpt; mid LAD 40-60%; b) Myoview 07/2014: LOW RISK, small-severe fixed inferior defect c/w infarct w/o peri-infarct ischemia.   Chronic bronchitis    "frequently; not q yr" (10/03/2013)   Diabetes mellitus without complication    Type 2-diet controlled.    Diverticulosis    Essential hypertension    with prior Accelerated HTN   GERD (gastroesophageal reflux disease)    Hiatal hernia    Hx of non-ST elevation myocardial infarction (NSTEMI) 10/01/2013   Due to Accelerated HTN with existing CAD   Hyperlipemia    Migraine    "@ least once/month" (10/03/2013)   OSA (obstructive sleep apnea) 06/10/2016   Schatzki's ring    Seasonal allergies    Sinusitis    Spinal headache    during C-section of second child.    Past Surgical History:  Procedure Laterality Date   ABDOMINAL HYSTERECTOMY  1994   "partial"   CAPSULAR RELEASE Right 06/06/2020   Procedure: RIGHT SHOULDER MANIPULATION UNDER ANESTHESIA, ROTATOR CUFF REPAIR;  Surgeon: Cammy Copaean, Gregory Scott, MD;  Location: [facility] OR;  Service: Orthopedics;  Laterality: Right;   CARDIAC CATHETERIZATION N/A 08/05/2015   Procedure: Left Heart Cath and Coronary Angiography;  Surgeon: Marykay Lexavid W Harding, MD;  [facility];  Service:  Cardiovascular;  Laterality: N/A;   CESAREAN SECTION  1989   COLONOSCOPY     CORONARY ANGIOPLASTY  10/03/2013   95% ostial AV G Cx - 2.5 mm AngioSculpt Balloon PTCA; mid LAD 40-60%   LEFT HEART CATHETERIZATION WITH CORONARY ANGIOGRAM N/A 10/02/2013   Procedure: LEFT HEART CATHETERIZATION WITH CORONARY ANGIOGRAM;  Surgeon: Lennette Biharihomas A Kelly, MD;  [facility];  Service: Cardiovascular;  Laterality: N/A;   NASAL SEPTOPLASTY W/ TURBINOPLASTY  ~ 2007   NM MYOVIEW LTD  08/22/2014    Low risk stress nuclear study with a small, severe, fixed defect in the distal inferior wall/apex suggestive of small prior infarct; no ischemia.  LV Wall Motion:  NL LV Function; NL Wall Motion   PERCUTANEOUS CORONARY STENT INTERVENTION (PCI-S) N/A 10/03/2013   cutting balloon angioplasty only no stent.    SINOSCOPY     TRANSTHORACIC ECHOCARDIOGRAM  10/02/2013   EF 55-60%; mild LVH, no RWMA,    TUBAL LIGATION  1989   Patient Active Problem List   Diagnosis Date Noted   Coronary artery disease involving native heart with angina pectoris, unspecified vessel or lesion type 10/29/2021   Perineal irritation in female 08/15/2021   Dysuria 08/15/2021   Plantar fasciitis of right foot 07/04/2021   Trochanteric bursitis of  left hip 04/08/2021   Pain of left middle finger 04/08/2021   Other allergic rhinitis 01/01/2021   Frequent episodes of sinusitis 01/01/2021   History of bronchitis 01/01/2021   Allergic conjunctivitis of both eyes 01/01/2021   Family history of breast cancer in sister 09/20/2020   Type 2 diabetes mellitus with other circulatory complications, HTN, CVD (HCC) 05/17/2020   Morbid obesity 05/17/2020   BMI 40.0-44.9, adult 05/17/2020   Acute non-recurrent maxillary sinusitis 11/04/2016   Acute pain of both knees 08/28/2016   OSA (obstructive sleep apnea) 06/10/2016   Costochondritis 06/09/2016   Bilateral leg cramps 02/07/2016   Peripheral edema 01/29/2016   Hypertension associated with  diabetes    GERD (gastroesophageal reflux disease) 08/10/2014   Menopausal syndrome 08/10/2014   CAD S/P percutaneous coronary angioplasty - Ostial AVG Cx - Scoring balloon PTCA 10/03/2013   Hyperlipidemia with target LDL less than 70 10/02/2013   H/O non-ST elevation myocardial infarction (NSTEMI) 10/02/2013   Normocytic anemia, not due to blood loss    Allergic sinusitis 03/17/2007   MIGRAINE, COMMON W/O INTRACTABLE MIGRAINE 03/16/2007    PCP: Excell Seltzer MD  REFERRING PROVIDER: Cammy Copa, MD  REFERRING DIAG: [phone] (ICD-10-CM) - Complex tear of lateral meniscus of left knee as current injury, subsequent encounter M25.562 (ICD-10-CM) - Left knee pain, unspecified chronicity M25.462 (ICD-10-CM) - Effusion, left knee  THERAPY DIAG:  Acute pain of left knee  Localized edema  Stiffness of left knee, not elsewhere classified  Muscle weakness (generalized)  Other abnormalities of gait and mobility  Rationale for Evaluation and Treatment: Rehabilitation  ONSET DATE: Surgery 08/10/2022  SUBJECTIVE:   SUBJECTIVE STATEMENT: Was very sore after last visit; still just having some discomfort   PERTINENT HISTORY: Anemia, CAD, DM, HTN, HLD, hx NSTEMI, hx migraines  PAIN:  NPRS scale: 0 currently, up to 3/10 Pain location: Lt knee  Pain description: sore Aggravating factors: bending knee, prolonged positioning, standing/walking Relieving factors: repositioning, ice  PRECAUTIONS: Meniscus repair.   WEIGHT BEARING RESTRICTIONS: PWB for 2 weeks (ending 09/21/2022 per MD note then FWB after)  FALLS:  Has patient fallen in last 6 months? No  LIVING ENVIRONMENT: Lives with: lives alone and currently living at son's house (1 step to enter his home, and staying downstairs) Lives in: House/apartment Stairs: No Has following equipment at home: Single point cane and Std W  OCCUPATION: Research scientist (medical); currently not working  PLOF: Independent; spend  time with grandchildren, go out with friends; travel, no regular exercise  PATIENT GOALS: improve knee function, walk without device   OBJECTIVE:   PATIENT SURVEYS:  09/11/22: FOTO 29 (predicted 53) 10/05/22: FOTO 52  COGNITION: Overall cognitive status: WFL   EDEMA:  09/11/22: not formally measured but Lt knee and lower leg edema present  POSTURE: rounded shoulders and forward head  LOWER EXTREMITY ROM:   ROM Left eval Left 09/14/2022 Left 09/28/22 Left 10/05/22  Knee flexion A: 87 P:90 AROM in supine heel slide: 102 A: 111 A: 115  Knee extension A: -5 (LAQ) P: 0  A: -3 (LAQ) A: -2 (LAQ)   (Blank rows = not tested)  LOWER EXTREMITY MMT:  Eval: Deferred formal testing; Lt knee grossly 3-/5  MMT Right 09/21/2022 Left 09/21/2022  Hip flexion 5/5 5/5  Knee flexion 5/5 5/5  Knee extension 5/5 4/5   (Blank rows = not tested)  FUNCTIONAL TESTS:  09/11/22:  5x STS: 24.12 sec with UE support needed  GAIT: 09/21/2022:  SPC  use in Rt UE in clinic level surfaces < 150 ft with supervision.   09/11/22 Distance walked: 100' Assistive device utilized:  Std W Level of assistance: Modified independence Comments: antalgic gait; decreased velocity  TODAY'S TREATMENT:                                                 DATE:10/05/2022 Therex: SciFit bike L4 x 10 min LAQ 2x10 on Lt; 5# AROM measurements - see above for details Leg press Double leg 100 lbs 3x10 , single Lt only 43 lbs 3x10 Sidestepping to [employer]; 3 pods with random hit 30 sec x 3 cycles   TODAY'S TREATMENT:                                                 DATE:09/30/2022 Therex: Nustep Lvl 6 UE/LE 10 mins Leg press Double leg 100 lbs 2 x 15 , single Lt only 37 lbs 2 x 15  Neuro Re-Ed Side stepping on foam balance beam x 5 laps in // bars; intermittent UE support Tandem walking with light UE support on foam beam in // bars x 5 laps SLS activities on LLE to 3 Blaze Pods (15 sec on, 30 sec rest) x 5 cycles   TODAY'S  TREATMENT:                                                 DATE:09/28/2022 Therex: Nustep Lvl 6 UE/LE 10 mins Leg press Double leg 100 lbs 2 x 15 , single Lt only 37 lbs 2 x 15 Seated LAQ 5 lbs 3 sec hold in extension 2 x 15  ROM measurements - see above AA heel slides with 5-10 sec hold x 15 reps  TODAY'S TREATMENT:                                                 DATE:09/23/2022 Therex: Nustep Lvl 6 UE/LE 10 mins Leg press Double leg 75 lbs 2 x 15 , single Lt only 31 lbs 2 x 15 Tandem stand 2x30 sec bil without UE support Tandem gait in // bars with intermittent UE support x 3 lap Seated LAQ 5 lbs 3 sec hold in extension 2 x 15   PATIENT EDUCATION:  09/17/2022 Education details: HEP update Person educated: Patient Education method: Programmer, multimedia, Demonstration, Verbal cues, and Handouts Education comprehension: verbalized understanding, returned demonstration, and verbal cues required  HOME EXERCISE PROGRAM: Access Code: [email or web address] URL: [email or web address] Date: 09/17/2022 Prepared by: Chyrel Masson  Exercises - Supine Heel Slide with Strap  - 5-10 x daily - 7 x weekly - 1 sets - 5-10 reps - Seated Knee Extension AROM (Mirrored)  - 3-5 x daily - 7 x weekly - 1-2 sets - 10 reps - 5 sec hold - Seated Knee Flexion Stretch (Mirrored)  - 3-5 x daily - 7 x weekly - 1-2 sets - 10 reps - 5 sec hold - Seated Quad  Set (Mirrored)  - 3-5 x daily - 7 x weekly - 1 sets - 10 reps - 5 hold - Supine 90/90 Sciatic Nerve Glide with Knee Flexion/Extension  - 1-2 x daily - 7 x weekly - 1-2 sets - 10 reps - Seated Straight Leg Heel Taps  - 1-2 x daily - 7 x weekly - 3 sets - 10 reps  ASSESSMENT:  CLINICAL IMPRESSION: ROM improving and progressing well with PT.  Good tolerance to sidestepping activity today.  OBJECTIVE IMPAIRMENTS: Abnormal gait, decreased activity tolerance, decreased balance, decreased endurance, decreased knowledge of use of DME, decreased mobility, difficulty  walking, decreased ROM, decreased strength, increased edema, increased fascial restrictions, and pain.   ACTIVITY LIMITATIONS: carrying, lifting, bending, sitting, standing, squatting, sleeping, stairs, transfers, bed mobility, and locomotion level  PARTICIPATION LIMITATIONS: meal prep, cleaning, laundry, driving, shopping, community activity, and occupation  PERSONAL FACTORS: 3+ comorbidities: Anemia, CAD, DM, HTN, HLD, hx NSTEMI, hx migraines  are also affecting patient's functional outcome.   REHAB POTENTIAL: Good  CLINICAL DECISION MAKING: Stable/uncomplicated  EVALUATION COMPLEXITY: Low   GOALS: Goals reviewed with patient? Yes  SHORT TERM GOALS: (target date for Short term goals are 3 weeks 10/02/2022)  1.  Patient will demonstrate independent use of home exercise program to maintain progress from in clinic treatments. Goal status: met  LONG TERM GOALS: (target dates for all long term goals: 10/23/2022    1. Patient will demonstrate/report pain at worst less than or equal to 2/10 to facilitate minimal limitation in daily activity secondary to pain symptoms. Goal status: on going 09/21/2022   2. Patient will demonstrate independent use of home exercise program to facilitate ability to maintain/progress functional gains from skilled physical therapy services. Goal status: on going 09/21/2022   3. Patient will demonstrate FOTO outcome > or = 53 % to indicate reduced disability due to condition. Goal status: on going 09/21/2022   4.  Patient will demonstrate Lt LE MMT 5/5 throughout to faciltiate usual transfers, stairs, squatting at [facility] for daily life.  Goal status: on going 09/21/2022   5.  Patient will demonstrate Lt knee AROM 0-110 deg to facilitate transfers, ambulation, bending for daily activity at PLOF s limitation.  Goal status: on going 09/21/2022   6.  Improve 5x STS to < 20 sec without UE support for improved functional strength Goal status: on going  09/21/2022  PLAN:  PT FREQUENCY: 1-2x/week  PT DURATION: 6 weeks  PLANNED INTERVENTIONS: Therapeutic exercises, Therapeutic activity, Neuro Muscular re-education, Balance training, Gait training, Patient/Family education, Joint mobilization, Stair training, DME instructions, Dry Needling, Electrical stimulation, Traction, Cryotherapy, vasopneumatic deviceMoist heat, Taping, Ultrasound, Ionotophoresis 4mg /ml Dexamethasone, and aquatic therapy, Manual therapy.  All included unless contraindicated  PLAN FOR NEXT SESSION:  work on balance, pre gait without device;  continue strengthening exercises  Next MD visit: 10/05/22   Clarita Crane, PT, DPT 10/05/22 1:43 PM

## 2022-10-06 MED ORDER — ASPIRIN 81 MG PO CHEW: CHEWABLE_TABLET | ORAL | 1 refills | Status: DC

## 2022-10-07 ENCOUNTER — Encounter: Payer: Self-pay | Admitting: Rehabilitative and Restorative Service Providers"

## 2022-10-07 ENCOUNTER — Encounter: Payer: Self-pay | Admitting: Surgical

## 2022-10-07 ENCOUNTER — Ambulatory Visit (INDEPENDENT_AMBULATORY_CARE_PROVIDER_SITE_OTHER): Payer: BC Managed Care – PPO | Admitting: Rehabilitative and Restorative Service Providers"

## 2022-10-07 ENCOUNTER — Encounter: Payer: BC Managed Care – PPO | Admitting: Physical Therapy

## 2022-10-07 DIAGNOSIS — M6281 Muscle weakness (generalized): Secondary | ICD-10-CM | POA: Diagnosis not present

## 2022-10-07 DIAGNOSIS — R600 Localized edema: Secondary | ICD-10-CM | POA: Diagnosis not present

## 2022-10-07 DIAGNOSIS — R2689 Other abnormalities of gait and mobility: Secondary | ICD-10-CM

## 2022-10-07 DIAGNOSIS — M25662 Stiffness of left knee, not elsewhere classified: Secondary | ICD-10-CM | POA: Diagnosis not present

## 2022-10-07 DIAGNOSIS — M25562 Pain in left knee: Secondary | ICD-10-CM

## 2022-10-07 MED ORDER — BUPIVACAINE HCL 0.25 % IJ SOLN
4.0000 mL | INTRAMUSCULAR | Status: AC | PRN
  Administered 2022-10-05: 4 mL via INTRA_ARTICULAR

## 2022-10-07 MED ORDER — LIDOCAINE HCL 1 % IJ SOLN
5.0000 mL | INTRAMUSCULAR | Status: AC | PRN
  Administered 2022-10-05: 5 mL

## 2022-10-07 NOTE — Progress Notes (Signed)
   Procedure Note  Patient: Christine Barrett             Date of Birth: Oct 29, 1960           MRN: [id number]             Visit Date: 10/05/2022  Procedures: Visit Diagnoses:  1. Complex tear of lateral meniscus of left knee as current injury, subsequent encounter     Large Joint Inj: L knee on 10/05/2022 12:41 PM Indications: diagnostic evaluation, joint swelling and pain Details: 18 G 1.5 in needle, superolateral approach  Arthrogram: No  Medications: 5 mL lidocaine 1 %; 4 mL bupivacaine 0.25 % Aspirate: 32 mL Outcome: tolerated well, no immediate complications  32 cc aspirated from the left knee.  Injection consisting of 4 cc of bupivacaine mixed with 1 cc of Toradol was administered.  No complications. Procedure, treatment alternatives, risks and benefits explained, specific risks discussed. Consent was given by the patient. Immediately prior to procedure a time out was called to verify the correct patient, procedure, equipment, support staff and site/side marked as required. Patient was prepped and draped in the usual sterile fashion.

## 2022-10-07 NOTE — Therapy (Signed)
OUTPATIENT PHYSICAL THERAPY TREATMENT   Patient Name: Christine Barrett MRN: [id number] DOB:05/30/1961, 62 y.o., female Today's Date: 10/07/2022  END OF SESSION:  PT End of Session - 10/07/22 1350     Visit Number 9    Number of Visits 12    Date for PT Re-Evaluation 10/23/22    Authorization Type [employer] $30 copay, 90 visit limit    PT Start Time 1345    PT Stop Time 1427    PT Time Calculation (min) 42 min    Activity Tolerance Patient limited by pain    Behavior During Therapy [facility] for tasks assessed/performed                     Past Medical History:  Diagnosis Date   Allergy    SEASONAL   Anemia    CAD S/P percutaneous coronary angioplasty 09/2013   a) Ostial AV G Cx - 2.5 mm Angiosculpt; mid LAD 40-60%; b) Myoview 07/2014: LOW RISK, small-severe fixed inferior defect c/w infarct w/o peri-infarct ischemia.   Chronic bronchitis    "frequently; not q yr" (10/03/2013)   Diabetes mellitus without complication    Type 2-diet controlled.    Diverticulosis    Essential hypertension    with prior Accelerated HTN   GERD (gastroesophageal reflux disease)    Hiatal hernia    Hx of non-ST elevation myocardial infarction (NSTEMI) 10/01/2013   Due to Accelerated HTN with existing CAD   Hyperlipemia    Migraine    "@ least once/month" (10/03/2013)   OSA (obstructive sleep apnea) 06/10/2016   Schatzki's ring    Seasonal allergies    Sinusitis    Spinal headache    during C-section of second child.    Past Surgical History:  Procedure Laterality Date   ABDOMINAL HYSTERECTOMY  1994   "partial"   CAPSULAR RELEASE Right 06/06/2020   Procedure: RIGHT SHOULDER MANIPULATION UNDER ANESTHESIA, ROTATOR CUFF REPAIR;  Surgeon: Cammy Copa, MD;  Location: [facility] OR;  Service: Orthopedics;  Laterality: Right;   CARDIAC CATHETERIZATION N/A 08/05/2015   Procedure: Left Heart Cath and Coronary Angiography;  Surgeon: Marykay Lex, MD;  Location: [facility] INVASIVE CV LAB;  Service:  Cardiovascular;  Laterality: N/A;   CESAREAN SECTION  1989   COLONOSCOPY     CORONARY ANGIOPLASTY  10/03/2013   95% ostial AV G Cx - 2.5 mm AngioSculpt Balloon PTCA; mid LAD 40-60%   LEFT HEART CATHETERIZATION WITH CORONARY ANGIOGRAM N/A 10/02/2013   Procedure: LEFT HEART CATHETERIZATION WITH CORONARY ANGIOGRAM;  Surgeon: Lennette Bihari, MD;  Location: [facility] CATH LAB;  Service: Cardiovascular;  Laterality: N/A;   NASAL SEPTOPLASTY W/ TURBINOPLASTY  ~ 2007   NM MYOVIEW LTD  08/22/2014    Low risk stress nuclear study with a small, severe, fixed defect in the distal inferior wall/apex suggestive of small prior infarct; no ischemia.  LV Wall Motion:  NL LV Function; NL Wall Motion   PERCUTANEOUS CORONARY STENT INTERVENTION (PCI-S) N/A 10/03/2013   cutting balloon angioplasty only no stent.    SINOSCOPY     TRANSTHORACIC ECHOCARDIOGRAM  10/02/2013   EF 55-60%; mild LVH, no RWMA,    TUBAL LIGATION  1989   Patient Active Problem List   Diagnosis Date Noted   Coronary artery disease involving native heart with angina pectoris, unspecified vessel or lesion type 10/29/2021   Perineal irritation in female 08/15/2021   Dysuria 08/15/2021   Plantar fasciitis of right foot 07/04/2021   Trochanteric bursitis  of left hip 04/08/2021   Pain of left middle finger 04/08/2021   Other allergic rhinitis 01/01/2021   Frequent episodes of sinusitis 01/01/2021   History of bronchitis 01/01/2021   Allergic conjunctivitis of both eyes 01/01/2021   Family history of breast cancer in sister 09/20/2020   Type 2 diabetes mellitus with other circulatory complications, HTN, CVD (HCC) 05/17/2020   Morbid obesity 05/17/2020   BMI 40.0-44.9, adult 05/17/2020   Acute non-recurrent maxillary sinusitis 11/04/2016   Acute pain of both knees 08/28/2016   OSA (obstructive sleep apnea) 06/10/2016   Costochondritis 06/09/2016   Bilateral leg cramps 02/07/2016   Peripheral edema 01/29/2016   Hypertension associated with  diabetes    GERD (gastroesophageal reflux disease) 08/10/2014   Menopausal syndrome 08/10/2014   CAD S/P percutaneous coronary angioplasty - Ostial AVG Cx - Scoring balloon PTCA 10/03/2013   Hyperlipidemia with target LDL less than 70 10/02/2013   H/O non-ST elevation myocardial infarction (NSTEMI) 10/02/2013   Normocytic anemia, not due to blood loss    Allergic sinusitis 03/17/2007   MIGRAINE, COMMON W/O INTRACTABLE MIGRAINE 03/16/2007    PCP: Excell Seltzer MD  REFERRING PROVIDER: Cammy Copa, MD  REFERRING DIAG: [phone] (ICD-10-CM) - Complex tear of lateral meniscus of left knee as current injury, subsequent encounter M25.562 (ICD-10-CM) - Left knee pain, unspecified chronicity M25.462 (ICD-10-CM) - Effusion, left knee  THERAPY DIAG:  Acute pain of left knee  Localized edema  Stiffness of left knee, not elsewhere classified  Muscle weakness (generalized)  Other abnormalities of gait and mobility  Rationale for Evaluation and Treatment: Rehabilitation  ONSET DATE: Surgery 08/10/2022  SUBJECTIVE:   SUBJECTIVE STATEMENT: She indicated more soreness than specific pain but felt it with rest and activity.  Knee can feel like it buckles per Pt.  Had fluid pulled off knee at MD visit.  She reported elevation at home as only intervention since MD visit.    PERTINENT HISTORY: Anemia, CAD, DM, HTN, HLD, hx NSTEMI, hx migraines  PAIN:  NPRS scale:  7/10 upon arrival for soreness.  Pain location: Lt knee  Pain description: sore Aggravating factors: bending knee, prolonged positioning, standing/walking Relieving factors: repositioning, ice  PRECAUTIONS: Meniscus repair.   WEIGHT BEARING RESTRICTIONS: PWB for 2 weeks (ending 09/21/2022 per MD note then FWB after)  FALLS:  Has patient fallen in last 6 months? No  LIVING ENVIRONMENT: Lives with: lives alone and currently living at son's house (1 step to enter his home, and staying downstairs) Lives in:  House/apartment Stairs: No Has following equipment at home: Single point cane and Std W  OCCUPATION: Research scientist (medical); currently not working  PLOF: Independent; spend time with grandchildren, go out with friends; travel, no regular exercise  PATIENT GOALS: improve knee function, walk without device   OBJECTIVE:   PATIENT SURVEYS:  10/05/22: FOTO 52 09/11/22: FOTO 29 (predicted 53)   COGNITION: Overall cognitive status: WFL   EDEMA:  09/11/22: not formally measured but Lt knee and lower leg edema present  POSTURE: rounded shoulders and forward head  LOWER EXTREMITY ROM:   ROM Left eval Left 09/14/2022 Left 09/28/22 Left 10/05/22  Knee flexion A: 87 P:90 AROM in supine heel slide: 102 A: 111 A: 115  Knee extension A: -5 (LAQ) P: 0  A: -3 (LAQ) A: -2 (LAQ)   (Blank rows = not tested)  LOWER EXTREMITY MMT:  Eval: Deferred formal testing; Lt knee grossly 3-/5  MMT Right 09/21/2022 Left 09/21/2022  Hip flexion  5/5 5/5  Knee flexion 5/5 5/5  Knee extension 5/5 4/5   (Blank rows = not tested)  FUNCTIONAL TESTS:  09/11/22:  5x STS: 24.12 sec with UE support needed  GAIT: 09/21/2022:  SPC use in Rt UE in clinic level surfaces < 150 ft with supervision.   09/11/22 Distance walked: 100' Assistive device utilized:  Std W Level of assistance: Modified independence Comments: antalgic gait; decreased velocity  TODAY'S TREATMENT:                                                 DATE:10/07/2022 Therex: Recumbent bike lvl 1 8 mins ROM, seat 9 Incline gastroc stretch 30 sec x 3 LAQ 3 lbs (reduced due to arrival pain) with pause in end ranges 2 x 15 Seated SLR 2 x 10 Lt  Supine LAQ with hip flexion in 90 deg x 10    Vasopneumatic  10 mins Lt knee in elevation medium compression 34 degrees  TODAY'S TREATMENT:                                                 DATE:10/05/2022 Therex: SciFit bike L4 x 10 min LAQ 2x10 on Lt; 5# AROM measurements - see above for  details Leg press Double leg 100 lbs 3x10 , single Lt only 43 lbs 3x10 Sidestepping to [employer]; 3 pods with random hit 30 sec x 3 cycles   TODAY'S TREATMENT:                                                 DATE:09/30/2022 Therex: Nustep Lvl 6 UE/LE 10 mins Leg press Double leg 100 lbs 2 x 15 , single Lt only 37 lbs 2 x 15  Neuro Re-Ed Side stepping on foam balance beam x 5 laps in // bars; intermittent UE support Tandem walking with light UE support on foam beam in // bars x 5 laps SLS activities on LLE to 3 Blaze Pods (15 sec on, 30 sec rest) x 5 cycles   TODAY'S TREATMENT:                                                 DATE:09/28/2022 Therex: Nustep Lvl 6 UE/LE 10 mins Leg press Double leg 100 lbs 2 x 15 , single Lt only 37 lbs 2 x 15 Seated LAQ 5 lbs 3 sec hold in extension 2 x 15  ROM measurements - see above AA heel slides with 5-10 sec hold x 15 reps   PATIENT EDUCATION:  09/17/2022 Education details: HEP update Person educated: Patient Education method: Programmer, multimediaxplanation, Demonstration, Verbal cues, and Handouts Education comprehension: verbalized understanding, returned demonstration, and verbal cues required  HOME EXERCISE PROGRAM: Access Code: [email or web address] URL: [email or web address] Date: 09/17/2022 Prepared by: Chyrel MassonMichael Larysa Pall  Exercises - Supine Heel Slide with Strap  - 5-10 x daily - 7 x weekly - 1 sets - 5-10 reps - Seated Knee Extension AROM (Mirrored)  -  3-5 x daily - 7 x weekly - 1-2 sets - 10 reps - 5 sec hold - Seated Knee Flexion Stretch (Mirrored)  - 3-5 x daily - 7 x weekly - 1-2 sets - 10 reps - 5 sec hold - Seated Quad Set (Mirrored)  - 3-5 x daily - 7 x weekly - 1 sets - 10 reps - 5 hold - Supine 90/90 Sciatic Nerve Glide with Knee Flexion/Extension  - 1-2 x daily - 7 x weekly - 1-2 sets - 10 reps - Seated Straight Leg Heel Taps  - 1-2 x daily - 7 x weekly - 3 sets - 10 reps  ASSESSMENT:  CLINICAL IMPRESSION: Reduced WB activity in clinic  due to pain complaints reported.  Encouraged use of walker if necessary when symptoms worsen to reduce WB complaints as needed.  Focused today on ROM, swelling reduction techniques in intervention.  OBJECTIVE IMPAIRMENTS: Abnormal gait, decreased activity tolerance, decreased balance, decreased endurance, decreased knowledge of use of DME, decreased mobility, difficulty walking, decreased ROM, decreased strength, increased edema, increased fascial restrictions, and pain.   ACTIVITY LIMITATIONS: carrying, lifting, bending, sitting, standing, squatting, sleeping, stairs, transfers, bed mobility, and locomotion level  PARTICIPATION LIMITATIONS: meal prep, cleaning, laundry, driving, shopping, community activity, and occupation  PERSONAL FACTORS: 3+ comorbidities: Anemia, CAD, DM, HTN, HLD, hx NSTEMI, hx migraines  are also affecting patient's functional outcome.   REHAB POTENTIAL: Good  CLINICAL DECISION MAKING: Stable/uncomplicated  EVALUATION COMPLEXITY: Low   GOALS: Goals reviewed with patient? Yes  SHORT TERM GOALS: (target date for Short term goals are 3 weeks 10/02/2022)  1.  Patient will demonstrate independent use of home exercise program to maintain progress from in clinic treatments. Goal status: met  LONG TERM GOALS: (target dates for all long term goals: 10/23/2022    1. Patient will demonstrate/report pain at worst less than or equal to 2/10 to facilitate minimal limitation in daily activity secondary to pain symptoms. Goal status: on going 09/21/2022   2. Patient will demonstrate independent use of home exercise program to facilitate ability to maintain/progress functional gains from skilled physical therapy services. Goal status: on going 09/21/2022   3. Patient will demonstrate FOTO outcome > or = 53 % to indicate reduced disability due to condition. Goal status: on going 09/21/2022   4.  Patient will demonstrate Lt LE MMT 5/5 throughout to faciltiate usual transfers,  stairs, squatting at [facility] for daily life.  Goal status: on going 09/21/2022   5.  Patient will demonstrate Lt knee AROM 0-110 deg to facilitate transfers, ambulation, bending for daily activity at PLOF s limitation.  Goal status: on going 09/21/2022   6.  Improve 5x STS to < 20 sec without UE support for improved functional strength Goal status: on going 09/21/2022  PLAN:  PT FREQUENCY: 1-2x/week  PT DURATION: 6 weeks  PLANNED INTERVENTIONS: Therapeutic exercises, Therapeutic activity, Neuro Muscular re-education, Balance training, Gait training, Patient/Family education, Joint mobilization, Stair training, DME instructions, Dry Needling, Electrical stimulation, Traction, Cryotherapy, vasopneumatic deviceMoist heat, Taping, Ultrasound, Ionotophoresis 4mg /ml Dexamethasone, and aquatic therapy, Manual therapy.  All included unless contraindicated  PLAN FOR NEXT SESSION:  Return to WB activity as tolerated by symptoms/ presentation.  Progressive strengthening improvements important.   Recert upcoming (10/23/2022)   Chyrel Masson, PT, DPT, OCS, ATC 10/07/22  2:20 PM

## 2022-10-07 NOTE — Progress Notes (Signed)
Post-Op Visit Note   Patient: Christine Barrett           Date of Birth: 16-Jan-1961           MRN: [id number] Visit Date: 10/05/2022 PCP: Excell Seltzer, MD   Assessment & Plan:  Chief Complaint:  Chief Complaint  Patient presents with   Left Knee - Follow-up     left knee arthroscopy with anterior horn lateral meniscal repair performed 08/10/2022   Visit Diagnoses:  1. Complex tear of lateral meniscus of left knee as current injury, subsequent encounter     Plan: Patient is a 61 year old female who returns s/p left knee arthroscopy with anterior horn lateral meniscal repair performed on 08/10/2022.  She has been weightbearing and last saw Dr. August Saucer about a month ago.  Currently walking with a cane instead of a walker.  She states that pain is getting better and better.  She is progressing well physical therapy.  She is going to physical therapy 2 times per week.  On exam, she has 0 degrees extension and 115 degrees of knee flexion.  She does have moderate effusion.  Quad strength is excellent rated 5/5.  She is able to perform straight leg raise multiple times.  She ambulates with small limp due to antalgia.  We discussed options available to patient, she would like to try aspirating her knee again.  She had it aspirated by Dr. August Saucer a month ago and had 25 cc that was aspirated.  Today, 32 cc was aspirated from the left knee.  Toradol injection administered and she tolerated the procedure well.  Will see how this does for her and follow-up with Dr. August Saucer in 4 weeks for clinical recheck.  Aspirin refilled.  Follow-Up Instructions: No follow-ups on file.   Orders:  No orders of the defined types were placed in this encounter.  Meds ordered this encounter  Medications   aspirin (ASPIRIN LOW DOSE) 81 MG chewable tablet    Sig: CHEW 1 TABLET (81 MG TOTAL) BY MOUTH 2 (TWO) TIMES DAILY. TO PREVENT BLOOD CLOTS    Dispense:  180 tablet    Refill:  1    Imaging: No results found.  PMFS  History: Patient Active Problem List   Diagnosis Date Noted   Coronary artery disease involving native heart with angina pectoris, unspecified vessel or lesion type 10/29/2021   Perineal irritation in female 08/15/2021   Dysuria 08/15/2021   Plantar fasciitis of right foot 07/04/2021   Trochanteric bursitis of left hip 04/08/2021   Pain of left middle finger 04/08/2021   Other allergic rhinitis 01/01/2021   Frequent episodes of sinusitis 01/01/2021   History of bronchitis 01/01/2021   Allergic conjunctivitis of both eyes 01/01/2021   Family history of breast cancer in sister 09/20/2020   Type 2 diabetes mellitus with other circulatory complications, HTN, CVD (HCC) 05/17/2020   Morbid obesity 05/17/2020   BMI 40.0-44.9, adult 05/17/2020   Acute non-recurrent maxillary sinusitis 11/04/2016   Acute pain of both knees 08/28/2016   OSA (obstructive sleep apnea) 06/10/2016   Costochondritis 06/09/2016   Bilateral leg cramps 02/07/2016   Peripheral edema 01/29/2016   Hypertension associated with diabetes    GERD (gastroesophageal reflux disease) 08/10/2014   Menopausal syndrome 08/10/2014   CAD S/P percutaneous coronary angioplasty - Ostial AVG Cx - Scoring balloon PTCA 10/03/2013   Hyperlipidemia with target LDL less than 70 10/02/2013   H/O non-ST elevation myocardial infarction (NSTEMI) 10/02/2013   Normocytic anemia, not  due to blood loss    Allergic sinusitis 03/17/2007   MIGRAINE, COMMON W/O INTRACTABLE MIGRAINE 03/16/2007   Past Medical History:  Diagnosis Date   Allergy    SEASONAL   Anemia    CAD S/P percutaneous coronary angioplasty 09/2013   a) Ostial AV G Cx - 2.5 mm Angiosculpt; mid LAD 40-60%; b) Myoview 07/2014: LOW RISK, small-severe fixed inferior defect c/w infarct w/o peri-infarct ischemia.   Chronic bronchitis    "frequently; not q yr" (10/03/2013)   Diabetes mellitus without complication    Type 2-diet controlled.    Diverticulosis    Essential hypertension     with prior Accelerated HTN   GERD (gastroesophageal reflux disease)    Hiatal hernia    Hx of non-ST elevation myocardial infarction (NSTEMI) 10/01/2013   Due to Accelerated HTN with existing CAD   Hyperlipemia    Migraine    "@ least once/month" (10/03/2013)   OSA (obstructive sleep apnea) 06/10/2016   Schatzki's ring    Seasonal allergies    Sinusitis    Spinal headache    during C-section of second child.     Family History  Adopted: Yes  Problem Relation Age of Onset   Allergic rhinitis Mother    Diabetes Mother    Sinusitis Mother    Breast cancer Sister    Hypertension Sister    Colon cancer Neg Hx    Heart attack Neg Hx    Stroke Neg Hx     Past Surgical History:  Procedure Laterality Date   ABDOMINAL HYSTERECTOMY  1994   "partial"   CAPSULAR RELEASE Right 06/06/2020   Procedure: RIGHT SHOULDER MANIPULATION UNDER ANESTHESIA, ROTATOR CUFF REPAIR;  Surgeon: Cammy Copa, MD;  Location: [facility] OR;  Service: Orthopedics;  Laterality: Right;   CARDIAC CATHETERIZATION N/A 08/05/2015   Procedure: Left Heart Cath and Coronary Angiography;  Surgeon: Marykay Lex, MD;  Location[facility] INVASIVE CV LAB;  Service: Cardiovascular;  Laterality: N/A;   CESAREAN SECTION  1989   COLONOSCOPY     CORONARY ANGIOPLASTY  10/03/2013   95% ostial AV G Cx - 2.5 mm AngioSculpt Balloon PTCA; mid LAD 40-60%   LEFT HEART CATHETERIZATION WITH CORONARY ANGIOGRAM N/A 10/02/2013   Procedure: LEFT HEART CATHETERIZATION WITH CORONARY ANGIOGRAM;  Surgeon: Lennette Bihari, MD;  Location: [facility] CATH LAB;  Service: Cardiovascular;  Laterality: N/A;   NASAL SEPTOPLASTY W/ TURBINOPLASTY  ~ 2007   NM MYOVIEW LTD  08/22/2014    Low risk stress nuclear study with a small, severe, fixed defect in the distal inferior wall/apex suggestive of small prior infarct; no ischemia.  LV Wall Motion:  NL LV Function; NL Wall Motion   PERCUTANEOUS CORONARY STENT INTERVENTION (PCI-S) N/A 10/03/2013   cutting balloon angioplasty  only no stent.    SINOSCOPY     TRANSTHORACIC ECHOCARDIOGRAM  10/02/2013   EF 55-60%; mild LVH, no RWMA,    TUBAL LIGATION  1989   Social History   Occupational History   Occupation: Biomedical engineer  Tobacco Use   Smoking status: Never    Passive exposure: Never   Smokeless tobacco: Never  Vaping Use   Vaping Use: Never used  Substance and Sexual Activity   Alcohol use: Yes    Alcohol/week: 0.0 standard drinks of alcohol    Comment: occ   Drug use: No   Sexual activity: Yes    Birth control/protection: Surgical

## 2022-10-12 ENCOUNTER — Ambulatory Visit (INDEPENDENT_AMBULATORY_CARE_PROVIDER_SITE_OTHER): Payer: BC Managed Care – PPO | Admitting: Physical Therapy

## 2022-10-12 ENCOUNTER — Encounter: Payer: Self-pay | Admitting: Orthopedic Surgery

## 2022-10-12 ENCOUNTER — Encounter: Payer: Self-pay | Admitting: Physical Therapy

## 2022-10-12 DIAGNOSIS — M6281 Muscle weakness (generalized): Secondary | ICD-10-CM

## 2022-10-12 DIAGNOSIS — M25662 Stiffness of left knee, not elsewhere classified: Secondary | ICD-10-CM | POA: Diagnosis not present

## 2022-10-12 DIAGNOSIS — R600 Localized edema: Secondary | ICD-10-CM | POA: Diagnosis not present

## 2022-10-12 DIAGNOSIS — R2689 Other abnormalities of gait and mobility: Secondary | ICD-10-CM

## 2022-10-12 DIAGNOSIS — M25562 Pain in left knee: Secondary | ICD-10-CM

## 2022-10-12 NOTE — Therapy (Signed)
OUTPATIENT PHYSICAL THERAPY TREATMENT   Patient Name: Christine Barrett MRN: [id number] DOB:June 10, 1961, 62 y.o., female Today's Date: 10/12/2022  END OF SESSION:  PT End of Session - 10/12/22 0914     Visit Number 10    Number of Visits 12    Date for PT Re-Evaluation 10/23/22    Authorization Type [employer] $30 copay, 90 visit limit    PT Start Time 0915    PT Stop Time 1010    PT Time Calculation (min) 55 min    Activity Tolerance Patient limited by pain    Behavior During Therapy [facility] for tasks assessed/performed                      Past Medical History:  Diagnosis Date   Allergy    SEASONAL   Anemia    CAD S/P percutaneous coronary angioplasty 09/2013   a) Ostial AV G Cx - 2.5 mm Angiosculpt; mid LAD 40-60%; b) Myoview 07/2014: LOW RISK, small-severe fixed inferior defect c/w infarct w/o peri-infarct ischemia.   Chronic bronchitis    "frequently; not q yr" (10/03/2013)   Diabetes mellitus without complication    Type 2-diet controlled.    Diverticulosis    Essential hypertension    with prior Accelerated HTN   GERD (gastroesophageal reflux disease)    Hiatal hernia    Hx of non-ST elevation myocardial infarction (NSTEMI) 10/01/2013   Due to Accelerated HTN with existing CAD   Hyperlipemia    Migraine    "@ least once/month" (10/03/2013)   OSA (obstructive sleep apnea) 06/10/2016   Schatzki's ring    Seasonal allergies    Sinusitis    Spinal headache    during C-section of second child.    Past Surgical History:  Procedure Laterality Date   ABDOMINAL HYSTERECTOMY  1994   "partial"   CAPSULAR RELEASE Right 06/06/2020   Procedure: RIGHT SHOULDER MANIPULATION UNDER ANESTHESIA, ROTATOR CUFF REPAIR;  Surgeon: Cammy Copa, MD;  Location: [facility] OR;  Service: Orthopedics;  Laterality: Right;   CARDIAC CATHETERIZATION N/A 08/05/2015   Procedure: Left Heart Cath and Coronary Angiography;  Surgeon: Marykay Lex, MD;  Location: [facility] INVASIVE CV LAB;  Service:  Cardiovascular;  Laterality: N/A;   CESAREAN SECTION  1989   COLONOSCOPY     CORONARY ANGIOPLASTY  10/03/2013   95% ostial AV G Cx - 2.5 mm AngioSculpt Balloon PTCA; mid LAD 40-60%   LEFT HEART CATHETERIZATION WITH CORONARY ANGIOGRAM N/A 10/02/2013   Procedure: LEFT HEART CATHETERIZATION WITH CORONARY ANGIOGRAM;  Surgeon: Lennette Bihari, MD;  Location: [facility] CATH LAB;  Service: Cardiovascular;  Laterality: N/A;   NASAL SEPTOPLASTY W/ TURBINOPLASTY  ~ 2007   NM MYOVIEW LTD  08/22/2014    Low risk stress nuclear study with a small, severe, fixed defect in the distal inferior wall/apex suggestive of small prior infarct; no ischemia.  LV Wall Motion:  NL LV Function; NL Wall Motion   PERCUTANEOUS CORONARY STENT INTERVENTION (PCI-S) N/A 10/03/2013   cutting balloon angioplasty only no stent.    SINOSCOPY     TRANSTHORACIC ECHOCARDIOGRAM  10/02/2013   EF 55-60%; mild LVH, no RWMA,    TUBAL LIGATION  1989   Patient Active Problem List   Diagnosis Date Noted   Coronary artery disease involving native heart with angina pectoris, unspecified vessel or lesion type 10/29/2021   Perineal irritation in female 08/15/2021   Dysuria 08/15/2021   Plantar fasciitis of right foot 07/04/2021   Trochanteric  bursitis of left hip 04/08/2021   Pain of left middle finger 04/08/2021   Other allergic rhinitis 01/01/2021   Frequent episodes of sinusitis 01/01/2021   History of bronchitis 01/01/2021   Allergic conjunctivitis of both eyes 01/01/2021   Family history of breast cancer in sister 09/20/2020   Type 2 diabetes mellitus with other circulatory complications, HTN, CVD (HCC) 05/17/2020   Morbid obesity 05/17/2020   BMI 40.0-44.9, adult 05/17/2020   Acute non-recurrent maxillary sinusitis 11/04/2016   Acute pain of both knees 08/28/2016   OSA (obstructive sleep apnea) 06/10/2016   Costochondritis 06/09/2016   Bilateral leg cramps 02/07/2016   Peripheral edema 01/29/2016   Hypertension associated with  diabetes    GERD (gastroesophageal reflux disease) 08/10/2014   Menopausal syndrome 08/10/2014   CAD S/P percutaneous coronary angioplasty - Ostial AVG Cx - Scoring balloon PTCA 10/03/2013   Hyperlipidemia with target LDL less than 70 10/02/2013   H/O non-ST elevation myocardial infarction (NSTEMI) 10/02/2013   Normocytic anemia, not due to blood loss    Allergic sinusitis 03/17/2007   MIGRAINE, COMMON W/O INTRACTABLE MIGRAINE 03/16/2007    PCP: Excell Seltzer MD  REFERRING PROVIDER: Cammy Copa, MD  REFERRING DIAG: [phone] (ICD-10-CM) - Complex tear of lateral meniscus of left knee as current injury, subsequent encounter M25.562 (ICD-10-CM) - Left knee pain, unspecified chronicity M25.462 (ICD-10-CM) - Effusion, left knee  THERAPY DIAG:  Acute pain of left knee  Localized edema  Stiffness of left knee, not elsewhere classified  Other abnormalities of gait and mobility  Muscle weakness (generalized)  Rationale for Evaluation and Treatment: Rehabilitation  ONSET DATE: Surgery 08/10/2022  SUBJECTIVE:   SUBJECTIVE STATEMENT: Feels like her knee is buckling more since having fluid removed and injection.  Can feel the swelling returning now.     PERTINENT HISTORY: Anemia, CAD, DM, HTN, HLD, hx NSTEMI, hx migraines  PAIN:  NPRS scale:  6/10 upon arrival for soreness.  Pain location: Lt knee  Pain description: sore Aggravating factors: bending knee, prolonged positioning, standing/walking Relieving factors: repositioning, ice  PRECAUTIONS: Meniscus repair.   WEIGHT BEARING RESTRICTIONS: PWB for 2 weeks (ending 09/21/2022 per MD note then FWB after)  FALLS:  Has patient fallen in last 6 months? No  LIVING ENVIRONMENT: Lives with: lives alone and currently living at son's house (1 step to enter his home, and staying downstairs) Lives in: House/apartment Stairs: No Has following equipment at home: Single point cane and Std W  OCCUPATION: Youth worker; currently not working  PLOF: Independent; spend time with grandchildren, go out with friends; travel, no regular exercise  PATIENT GOALS: improve knee function, walk without device   OBJECTIVE:   PATIENT SURVEYS:  10/05/22: FOTO 52 09/11/22: FOTO 29 (predicted 53)   COGNITION: Overall cognitive status: WFL   EDEMA:  09/11/22: not formally measured but Lt knee and lower leg edema present  POSTURE: rounded shoulders and forward head  LOWER EXTREMITY ROM:   ROM Left eval Left 09/14/2022 Left 09/28/22 Left 10/05/22  Knee flexion A: 87 P:90 AROM in supine heel slide: 102 A: 111 A: 115  Knee extension A: -5 (LAQ) P: 0  A: -3 (LAQ) A: -2 (LAQ)   (Blank rows = not tested)  LOWER EXTREMITY MMT:  Eval: Deferred formal testing; Lt knee grossly 3-/5  MMT Right 09/21/2022 Left 09/21/2022  Hip flexion 5/5 5/5  Knee flexion 5/5 5/5  Knee extension 5/5 4/5   (Blank rows = not tested)  FUNCTIONAL TESTS:  09/11/22:  5x STS: 24.12 sec with UE support needed  GAIT: 09/21/2022:  SPC use in Rt UE in clinic level surfaces < 150 ft with supervision.   09/11/22 Distance walked: 100' Assistive device utilized:  Std W Level of assistance: Modified independence Comments: antalgic gait; decreased velocity  TODAY'S TREATMENT:                                                 DATE:10/12/2022 Therex: SciFit bike seat 12 x 8 min; L3 LAQ on Lt; 3 sec hold 3x10; 5# Seated Lt SLR 2x10 Standing hip abduction working on decreasing UE support x10 bil Standing hip extension working on decreasing UE support x 10 bil Calf raises x 20 reps bil UE support    Vasopneumatic  10 mins Lt knee in elevation medium compression 34 degrees   TODAY'S TREATMENT:                                                 DATE:10/07/2022 Therex: Recumbent bike lvl 1 8 mins ROM, seat 9 Incline gastroc stretch 30 sec x 3 LAQ 3 lbs (reduced due to arrival pain) with pause in end ranges 2 x 15 Seated SLR 2 x 10 Lt   Supine LAQ with hip flexion in 90 deg x 10    Vasopneumatic  10 mins Lt knee in elevation medium compression 34 degrees  TODAY'S TREATMENT:                                                 DATE:10/05/2022 Therex: SciFit bike L4 x 10 min LAQ 2x10 on Lt; 5# AROM measurements - see above for details Leg press Double leg 100 lbs 3x10 , single Lt only 43 lbs 3x10 Sidestepping to Blaze Pods; 3 pods with random hit 30 sec x 3 cycles   TODAY'S TREATMENT:                                                 DATE:09/30/2022 Therex: Nustep Lvl 6 UE/LE 10 mins Leg press Double leg 100 lbs 2 x 15 , single Lt only 37 lbs 2 x 15  Neuro Re-Ed Side stepping on foam balance beam x 5 laps in // bars; intermittent UE support Tandem walking with light UE support on foam beam in // bars x 5 laps SLS activities on LLE to 3 Blaze Pods (15 sec on, 30 sec rest) x 5 cycles   TODAY'S TREATMENT:                                                 DATE:09/28/2022 Therex: Nustep Lvl 6 UE/LE 10 mins Leg press Double leg 100 lbs 2 x 15 , single Lt only 37 lbs 2 x 15 Seated LAQ 5 lbs 3 sec hold in extension  2 x 15  ROM measurements - see above AA heel slides with 5-10 sec hold x 15 reps   PATIENT EDUCATION:  09/17/2022 Education details: HEP update Person educated: Patient Education method: Programmer, multimedia, Demonstration, Verbal cues, and Handouts Education comprehension: verbalized understanding, returned demonstration, and verbal cues required  HOME EXERCISE PROGRAM: Access Code: [email or web address] URL: [email or web address] Date: 09/17/2022 Prepared by: Chyrel Masson  Exercises - Supine Heel Slide with Strap  - 5-10 x daily - 7 x weekly - 1 sets - 5-10 reps - Seated Knee Extension AROM (Mirrored)  - 3-5 x daily - 7 x weekly - 1-2 sets - 10 reps - 5 sec hold - Seated Knee Flexion Stretch (Mirrored)  - 3-5 x daily - 7 x weekly - 1-2 sets - 10 reps - 5 sec hold - Seated Quad Set (Mirrored)  - 3-5 x daily - 7 x  weekly - 1 sets - 10 reps - 5 hold - Supine 90/90 Sciatic Nerve Glide with Knee Flexion/Extension  - 1-2 x daily - 7 x weekly - 1-2 sets - 10 reps - Seated Straight Leg Heel Taps  - 1-2 x daily - 7 x weekly - 3 sets - 10 reps  ASSESSMENT:  CLINICAL IMPRESSION: Overall able to tolerate progression of exercises today, but does have some buckling in standing activities.  Will continue to benefit from PT to maximize function.  OBJECTIVE IMPAIRMENTS: Abnormal gait, decreased activity tolerance, decreased balance, decreased endurance, decreased knowledge of use of DME, decreased mobility, difficulty walking, decreased ROM, decreased strength, increased edema, increased fascial restrictions, and pain.   ACTIVITY LIMITATIONS: carrying, lifting, bending, sitting, standing, squatting, sleeping, stairs, transfers, bed mobility, and locomotion level  PARTICIPATION LIMITATIONS: meal prep, cleaning, laundry, driving, shopping, community activity, and occupation  PERSONAL FACTORS: 3+ comorbidities: Anemia, CAD, DM, HTN, HLD, hx NSTEMI, hx migraines  are also affecting patient's functional outcome.   REHAB POTENTIAL: Good  CLINICAL DECISION MAKING: Stable/uncomplicated  EVALUATION COMPLEXITY: Low   GOALS: Goals reviewed with patient? Yes  SHORT TERM GOALS: (target date for Short term goals are 3 weeks 10/02/2022)  1.  Patient will demonstrate independent use of home exercise program to maintain progress from in clinic treatments. Goal status: met  LONG TERM GOALS: (target dates for all long term goals: 10/23/2022    1. Patient will demonstrate/report pain at worst less than or equal to 2/10 to facilitate minimal limitation in daily activity secondary to pain symptoms. Goal status: on going 09/21/2022   2. Patient will demonstrate independent use of home exercise program to facilitate ability to maintain/progress functional gains from skilled physical therapy services. Goal status: on going  09/21/2022   3. Patient will demonstrate FOTO outcome > or = 53 % to indicate reduced disability due to condition. Goal status: on going 09/21/2022   4.  Patient will demonstrate Lt LE MMT 5/5 throughout to faciltiate usual transfers, stairs, squatting at [facility] for daily life.  Goal status: on going 09/21/2022   5.  Patient will demonstrate Lt knee AROM 0-110 deg to facilitate transfers, ambulation, bending for daily activity at PLOF s limitation.  Goal status: on going 09/21/2022   6.  Improve 5x STS to < 20 sec without UE support for improved functional strength Goal status: on going 09/21/2022  PLAN:  PT FREQUENCY: 1-2x/week  PT DURATION: 6 weeks  PLANNED INTERVENTIONS: Therapeutic exercises, Therapeutic activity, Neuro Muscular re-education, Balance training, Gait training, Patient/Family education, Joint mobilization, Stair training, DME instructions, Dry Needling, Electrical stimulation, Traction,  Cryotherapy, vasopneumatic deviceMoist heat, Taping, Ultrasound, Ionotophoresis /ml Dexamethasone, and aquatic therapy, Manual therapy.  All included unless contraindicated  PLAN FOR NEXT SESSION:  Closed chain activities as able,  Progressive strengthening improvements important.   Recert upcoming (10/23/2022)   Clarita Crane, PT, DPT 10/12/22 10:03 AM

## 2022-10-14 ENCOUNTER — Encounter: Payer: BC Managed Care – PPO | Admitting: Rehabilitative and Restorative Service Providers"

## 2022-10-17 ENCOUNTER — Encounter: Payer: Self-pay | Admitting: Orthopedic Surgery

## 2022-10-19 ENCOUNTER — Encounter: Payer: Self-pay | Admitting: Rehabilitative and Restorative Service Providers"

## 2022-10-19 ENCOUNTER — Ambulatory Visit (INDEPENDENT_AMBULATORY_CARE_PROVIDER_SITE_OTHER): Payer: BC Managed Care – PPO | Admitting: Rehabilitative and Restorative Service Providers"

## 2022-10-19 DIAGNOSIS — R600 Localized edema: Secondary | ICD-10-CM

## 2022-10-19 DIAGNOSIS — M25562 Pain in left knee: Secondary | ICD-10-CM

## 2022-10-19 DIAGNOSIS — M6281 Muscle weakness (generalized): Secondary | ICD-10-CM

## 2022-10-19 DIAGNOSIS — M25662 Stiffness of left knee, not elsewhere classified: Secondary | ICD-10-CM

## 2022-10-19 NOTE — Therapy (Signed)
OUTPATIENT PHYSICAL THERAPY TREATMENT   Patient Name: Eduardo Wurth MRN: [id number] DOB:Jul 05, 1960, 62 y.o., female Today's Date: 10/19/2022  END OF SESSION:  PT End of Session - 10/19/22 0801     Visit Number 11    Number of Visits 12    Date for PT Re-Evaluation 10/23/22    Authorization Type [employer] $30 copay, 90 visit limit    PT Start Time 0800    PT Stop Time 0850    PT Time Calculation (min) 50 min    Activity Tolerance Patient limited by pain    Behavior During Therapy [facility] for tasks assessed/performed                       Past Medical History:  Diagnosis Date   Allergy    SEASONAL   Anemia    CAD S/P percutaneous coronary angioplasty 09/2013   a) Ostial AV G Cx - 2.5 mm Angiosculpt; mid LAD 40-60%; b) Myoview 07/2014: LOW RISK, small-severe fixed inferior defect c/w infarct w/o peri-infarct ischemia.   Chronic bronchitis    "frequently; not q yr" (10/03/2013)   Diabetes mellitus without complication    Type 2-diet controlled.    Diverticulosis    Essential hypertension    with prior Accelerated HTN   GERD (gastroesophageal reflux disease)    Hiatal hernia    Hx of non-ST elevation myocardial infarction (NSTEMI) 10/01/2013   Due to Accelerated HTN with existing CAD   Hyperlipemia    Migraine    "@ least once/month" (10/03/2013)   OSA (obstructive sleep apnea) 06/10/2016   Schatzki's ring    Seasonal allergies    Sinusitis    Spinal headache    during C-section of second child.    Past Surgical History:  Procedure Laterality Date   ABDOMINAL HYSTERECTOMY  1994   "partial"   CAPSULAR RELEASE Right 06/06/2020   Procedure: RIGHT SHOULDER MANIPULATION UNDER ANESTHESIA, ROTATOR CUFF REPAIR;  Surgeon: Cammy Copa, MD;  Location: [facility] OR;  Service: Orthopedics;  Laterality: Right;   CARDIAC CATHETERIZATION N/A 08/05/2015   Procedure: Left Heart Cath and Coronary Angiography;  Surgeon: Marykay Lex, MD;  Location: [facility] INVASIVE CV LAB;  Service:  Cardiovascular;  Laterality: N/A;   CESAREAN SECTION  1989   COLONOSCOPY     CORONARY ANGIOPLASTY  10/03/2013   95% ostial AV G Cx - 2.5 mm AngioSculpt Balloon PTCA; mid LAD 40-60%   LEFT HEART CATHETERIZATION WITH CORONARY ANGIOGRAM N/A 10/02/2013   Procedure: LEFT HEART CATHETERIZATION WITH CORONARY ANGIOGRAM;  Surgeon: Lennette Bihari, MD;  Location: [facility] CATH LAB;  Service: Cardiovascular;  Laterality: N/A;   NASAL SEPTOPLASTY W/ TURBINOPLASTY  ~ 2007   NM MYOVIEW LTD  08/22/2014    Low risk stress nuclear study with a small, severe, fixed defect in the distal inferior wall/apex suggestive of small prior infarct; no ischemia.  LV Wall Motion:  NL LV Function; NL Wall Motion   PERCUTANEOUS CORONARY STENT INTERVENTION (PCI-S) N/A 10/03/2013   cutting balloon angioplasty only no stent.    SINOSCOPY     TRANSTHORACIC ECHOCARDIOGRAM  10/02/2013   EF 55-60%; mild LVH, no RWMA,    TUBAL LIGATION  1989   Patient Active Problem List   Diagnosis Date Noted   Coronary artery disease involving native heart with angina pectoris, unspecified vessel or lesion type 10/29/2021   Perineal irritation in female 08/15/2021   Dysuria 08/15/2021   Plantar fasciitis of right foot 07/04/2021  Trochanteric bursitis of left hip 04/08/2021   Pain of left middle finger 04/08/2021   Other allergic rhinitis 01/01/2021   Frequent episodes of sinusitis 01/01/2021   History of bronchitis 01/01/2021   Allergic conjunctivitis of both eyes 01/01/2021   Family history of breast cancer in sister 09/20/2020   Type 2 diabetes mellitus with other circulatory complications, HTN, CVD (HCC) 05/17/2020   Morbid obesity 05/17/2020   BMI 40.0-44.9, adult 05/17/2020   Acute non-recurrent maxillary sinusitis 11/04/2016   Acute pain of both knees 08/28/2016   OSA (obstructive sleep apnea) 06/10/2016   Costochondritis 06/09/2016   Bilateral leg cramps 02/07/2016   Peripheral edema 01/29/2016   Hypertension associated with  diabetes    GERD (gastroesophageal reflux disease) 08/10/2014   Menopausal syndrome 08/10/2014   CAD S/P percutaneous coronary angioplasty - Ostial AVG Cx - Scoring balloon PTCA 10/03/2013   Hyperlipidemia with target LDL less than 70 10/02/2013   H/O non-ST elevation myocardial infarction (NSTEMI) 10/02/2013   Normocytic anemia, not due to blood loss    Allergic sinusitis 03/17/2007   MIGRAINE, COMMON W/O INTRACTABLE MIGRAINE 03/16/2007    PCP: Excell Seltzer MD  REFERRING PROVIDER: Cammy Copa, MD  REFERRING DIAG: [phone] (ICD-10-CM) - Complex tear of lateral meniscus of left knee as current injury, subsequent encounter M25.562 (ICD-10-CM) - Left knee pain, unspecified chronicity M25.462 (ICD-10-CM) - Effusion, left knee  THERAPY DIAG:  Acute pain of left knee  Localized edema  Stiffness of left knee, not elsewhere classified  Muscle weakness (generalized)  Rationale for Evaluation and Treatment: Rehabilitation  ONSET DATE: Surgery 08/10/2022  SUBJECTIVE:   SUBJECTIVE STATEMENT: She indicated she felt 6/10 pain with walking and weight bearing.  She indicated stiffness, pulling, and swelling continued.    PERTINENT HISTORY: Anemia, CAD, DM, HTN, HLD, hx NSTEMI, hx migraines  PAIN:  NPRS scale:  6/10  Pain location: Lt knee  Pain description: sore Aggravating factors: bending knee, prolonged positioning, standing/walking Relieving factors: repositioning, ice  PRECAUTIONS: Meniscus repair.   WEIGHT BEARING RESTRICTIONS: PWB for 2 weeks (ending 09/21/2022 per MD note then FWB after)  FALLS:  Has patient fallen in last 6 months? No  LIVING ENVIRONMENT: Lives with: lives alone and currently living at son's house (1 step to enter his home, and staying downstairs) Lives in: House/apartment Stairs: No Has following equipment at home: Single point cane and Std W  OCCUPATION: Research scientist (medical); currently not working  PLOF: Independent; spend  time with grandchildren, go out with friends; travel, no regular exercise  PATIENT GOALS: improve knee function, walk without device   OBJECTIVE:   PATIENT SURVEYS:  10/05/22: FOTO 52 09/11/22: FOTO 29 (predicted 53)   COGNITION: Overall cognitive status: WFL   EDEMA:  09/11/22: not formally measured but Lt knee and lower leg edema present  POSTURE: rounded shoulders and forward head  LOWER EXTREMITY ROM:   ROM Left eval Left 09/14/2022 Left 09/28/22 Left 10/05/22 Left 10/19/2022  Knee flexion A: 87 P:90 AROM in supine heel slide: 102 A: 111 A: 115 110 AROM in supine heel slide limited by pain  Knee extension A: -5 (LAQ) P: 0  A: -3 (LAQ) A: -2 (LAQ)    (Blank rows = not tested)  LOWER EXTREMITY MMT:  Eval: Deferred formal testing; Lt knee grossly 3-/5  MMT Right 09/21/2022 Left 09/21/2022  Hip flexion 5/5 5/5  Knee flexion 5/5 5/5  Knee extension 5/5 4/5   (Blank rows = not tested)  FUNCTIONAL TESTS:  09/11/22:  5x STS: 24.12 sec with UE support needed  GAIT: 10/19/2022:  SPC use in Rt UE with antalgic gait with reduced stance on Lt leg, reduced step length with Rt leg, decreased gait speed.   09/21/2022:  SPC use in Rt UE in clinic level surfaces < 150 ft with supervision.   09/11/22 Distance walked: 100' Assistive device utilized:  Std W Level of assistance: Modified independence Comments: antalgic gait; decreased velocity  TODAY'S TREATMENT:                                                 DATE:10/19/2022 Therex: SciFit bike seat 14, lvl 3.0, 10 mins  Knee extension machine double leg up, Lt leg lowering 2 x 10 5 lbs  Leg press Double leg 87 lbs x 15, single leg 73 lbs 2 x 15 Lt leg Seated SLR 2 x 10 c slow movement focus Supine Lt AROM heel slide x 5   Additional time spent in education of routine HEP for mobility/strengthening.   Vasopneumatic  10 mins Lt knee in elevation medium compression 34 degrees  TODAY'S TREATMENT:                                                  DATE:10/12/2022 Therex: SciFit bike seat 12 x 8 min; L3 LAQ on Lt; 3 sec hold 3x10; 5# Seated Lt SLR 2x10 Standing hip abduction working on decreasing UE support x10 bil Standing hip extension working on decreasing UE support x 10 bil Calf raises x 20 reps bil UE support    Vasopneumatic  10 mins Lt knee in elevation medium compression 34 degrees   TODAY'S TREATMENT:                                                 DATE:10/07/2022 Therex: Recumbent bike lvl 1 8 mins ROM, seat 9 Incline gastroc stretch 30 sec x 3 LAQ 3 lbs (reduced due to arrival pain) with pause in end ranges 2 x 15 Seated SLR 2 x 10 Lt  Supine LAQ with hip flexion in 90 deg x 10    Vasopneumatic  10 mins Lt knee in elevation medium compression 34 degrees  TODAY'S TREATMENT:                                                 DATE:10/05/2022 Therex: SciFit bike L4 x 10 min LAQ 2x10 on Lt; 5# AROM measurements - see above for details Leg press Double leg 100 lbs 3x10 , single Lt only 43 lbs 3x10 Sidestepping to Blaze Pods; 3 pods with random hit 30 sec x 3 cycles   TODAY'S TREATMENT:  DATE:09/30/2022 Therex: Nustep Lvl 6 UE/LE 10 mins Leg press Double leg 100 lbs 2 x 15 , single Lt only 37 lbs 2 x 15  Neuro Re-Ed Side stepping on foam balance beam x 5 laps in // bars; intermittent UE support Tandem walking with light UE support on foam beam in // bars x 5 laps SLS activities on LLE to 3 Blaze Pods (15 sec on, 30 sec rest) x 5 cycles   PATIENT EDUCATION:  09/17/2022 Education details: HEP update Person educated: Patient Education method: Programmer, multimedia, Demonstration, Verbal cues, and Handouts Education comprehension: verbalized understanding, returned demonstration, and verbal cues required  HOME EXERCISE PROGRAM: Access Code: [email or web address] URL: [email or web address] Date: 09/17/2022 Prepared by: Chyrel Masson  Exercises - Supine Heel  Slide with Strap  - 5-10 x daily - 7 x weekly - 1 sets - 5-10 reps - Seated Knee Extension AROM (Mirrored)  - 3-5 x daily - 7 x weekly - 1-2 sets - 10 reps - 5 sec hold - Seated Knee Flexion Stretch (Mirrored)  - 3-5 x daily - 7 x weekly - 1-2 sets - 10 reps - 5 sec hold - Seated Quad Set (Mirrored)  - 3-5 x daily - 7 x weekly - 1 sets - 10 reps - 5 hold - Supine 90/90 Sciatic Nerve Glide with Knee Flexion/Extension  - 1-2 x daily - 7 x weekly - 1-2 sets - 10 reps - Seated Straight Leg Heel Taps  - 1-2 x daily - 7 x weekly - 3 sets - 10 reps  ASSESSMENT:  CLINICAL IMPRESSION: Altered gait and pain noted in clinic c SPC use (decreased step length with uninvolved side, reduced stance on surgery leg).  Necessary for improved strength and mobility of Lt knee to facilitate improved WB activity and functional movement patterns.   OBJECTIVE IMPAIRMENTS: Abnormal gait, decreased activity tolerance, decreased balance, decreased endurance, decreased knowledge of use of DME, decreased mobility, difficulty walking, decreased ROM, decreased strength, increased edema, increased fascial restrictions, and pain.   ACTIVITY LIMITATIONS: carrying, lifting, bending, sitting, standing, squatting, sleeping, stairs, transfers, bed mobility, and locomotion level  PARTICIPATION LIMITATIONS: meal prep, cleaning, laundry, driving, shopping, community activity, and occupation  PERSONAL FACTORS: 3+ comorbidities: Anemia, CAD, DM, HTN, HLD, hx NSTEMI, hx migraines  are also affecting patient's functional outcome.   REHAB POTENTIAL: Good  CLINICAL DECISION MAKING: Stable/uncomplicated  EVALUATION COMPLEXITY: Low   GOALS: Goals reviewed with patient? Yes  SHORT TERM GOALS: (target date for Short term goals are 3 weeks 10/02/2022)  1.  Patient will demonstrate independent use of home exercise program to maintain progress from in clinic treatments. Goal status: met  LONG TERM GOALS: (target dates for all long term  goals: 10/23/2022    1. Patient will demonstrate/report pain at worst less than or equal to 2/10 to facilitate minimal limitation in daily activity secondary to pain symptoms. Goal status: on going 10/19/2022   2. Patient will demonstrate independent use of home exercise program to facilitate ability to maintain/progress functional gains from skilled physical therapy services. Goal status: on going 10/19/2022   3. Patient will demonstrate FOTO outcome > or = 53 % to indicate reduced disability due to condition. Goal status: on going 10/19/2022   4.  Patient will demonstrate Lt LE MMT 5/5 throughout to faciltiate usual transfers, stairs, squatting at [facility] for daily life.  Goal status: on going 10/19/2022   5.  Patient will demonstrate Lt knee AROM 0-110 deg to facilitate transfers, ambulation, bending for daily  activity at PLOF s limitation.  Goal status: on going 10/19/2022   6.  Improve 5x STS to < 20 sec without UE support for improved functional strength Goal status: on going 10/19/2022  PLAN:  PT FREQUENCY: 1-2x/week  PT DURATION: 6 weeks  PLANNED INTERVENTIONS: Therapeutic exercises, Therapeutic activity, Neuro Muscular re-education, Balance training, Gait training, Patient/Family education, Joint mobilization, Stair training, DME instructions, Dry Needling, Electrical stimulation, Traction, Cryotherapy, vasopneumatic deviceMoist heat, Taping, Ultrasound, Ionotophoresis 4mg /ml Dexamethasone, and aquatic therapy, Manual therapy.  All included unless contraindicated  PLAN FOR NEXT SESSION:   Recert upcoming with objective data collection.  Continued to progress strength and WB control as tolerated.    Chyrel Masson, PT, DPT, OCS, ATC 10/19/22  8:44 AM

## 2022-10-21 ENCOUNTER — Encounter: Payer: BC Managed Care – PPO | Admitting: Rehabilitative and Restorative Service Providers"

## 2022-10-22 DIAGNOSIS — H401131 Primary open-angle glaucoma, bilateral, mild stage: Secondary | ICD-10-CM | POA: Diagnosis not present

## 2022-10-22 LAB — HM DIABETES EYE EXAM

## 2022-10-26 ENCOUNTER — Encounter: Payer: Self-pay | Admitting: Rehabilitative and Restorative Service Providers"

## 2022-10-26 ENCOUNTER — Ambulatory Visit (INDEPENDENT_AMBULATORY_CARE_PROVIDER_SITE_OTHER): Payer: BC Managed Care – PPO | Admitting: Rehabilitative and Restorative Service Providers"

## 2022-10-26 DIAGNOSIS — R2689 Other abnormalities of gait and mobility: Secondary | ICD-10-CM

## 2022-10-26 DIAGNOSIS — M6281 Muscle weakness (generalized): Secondary | ICD-10-CM

## 2022-10-26 DIAGNOSIS — M25662 Stiffness of left knee, not elsewhere classified: Secondary | ICD-10-CM | POA: Diagnosis not present

## 2022-10-26 DIAGNOSIS — R600 Localized edema: Secondary | ICD-10-CM | POA: Diagnosis not present

## 2022-10-26 DIAGNOSIS — M25562 Pain in left knee: Secondary | ICD-10-CM | POA: Diagnosis not present

## 2022-10-26 NOTE — Therapy (Addendum)
OUTPATIENT PHYSICAL THERAPY TREATMENT/ PROGRESS NOTE/ RECERT  /DISCHARGE   Patient Name: Christine Barrett MRN: [id number] DOB:07/03/1960, 62 y.o., female Today's Date: 10/26/2022  Progress Note Reporting Period 09/11/2022 to 10/26/2022  See note below for Objective Data and Assessment of Progress/Goals.   END OF SESSION:  PT End of Session - 10/26/22 0757     Visit Number 12    Number of Visits 27    Date for PT Re-Evaluation 12/21/22    Authorization Type [employer] $30 copay, 90 visit limit    PT Start Time 0758    PT Stop Time 0840    PT Time Calculation (min) 42 min    Activity Tolerance Patient limited by pain    Behavior During Therapy [facility] for tasks assessed/performed             Past Medical History:  Diagnosis Date   Allergy    SEASONAL   Anemia    CAD S/P percutaneous coronary angioplasty 09/2013   a) Ostial AV G Cx - 2.5 mm Angiosculpt; mid LAD 40-60%; b) Myoview 07/2014: LOW RISK, small-severe fixed inferior defect c/w infarct w/o peri-infarct ischemia.   Chronic bronchitis (HCC)    "frequently; not q yr" (10/03/2013)   Diabetes mellitus without complication (HCC)    Type 2-diet controlled.    Diverticulosis    Essential hypertension    with prior Accelerated HTN   GERD (gastroesophageal reflux disease)    Hiatal hernia    Hx of non-ST elevation myocardial infarction (NSTEMI) 10/01/2013   Due to Accelerated HTN with existing CAD   Hyperlipemia    Migraine    "@ least once/month" (10/03/2013)   OSA (obstructive sleep apnea) 06/10/2016   Schatzki's ring    Seasonal allergies    Sinusitis    Spinal headache    during C-section of second child.    Past Surgical History:  Procedure Laterality Date   ABDOMINAL HYSTERECTOMY  1994   "partial"   CAPSULAR RELEASE Right 06/06/2020   Procedure: RIGHT SHOULDER MANIPULATION UNDER ANESTHESIA, ROTATOR CUFF REPAIR;  Surgeon: Cammy Copa, MD;  Location: [facility] OR;  Service: Orthopedics;  Laterality: Right;   CARDIAC  CATHETERIZATION N/A 08/05/2015   Procedure: Left Heart Cath and Coronary Angiography;  Surgeon: Marykay Lex, MD;  Location: [facility] INVASIVE CV LAB;  Service: Cardiovascular;  Laterality: N/A;   CESAREAN SECTION  1989   COLONOSCOPY     CORONARY ANGIOPLASTY  10/03/2013   95% ostial AV G Cx - 2.5 mm AngioSculpt Balloon PTCA; mid LAD 40-60%   LEFT HEART CATHETERIZATION WITH CORONARY ANGIOGRAM N/A 10/02/2013   Procedure: LEFT HEART CATHETERIZATION WITH CORONARY ANGIOGRAM;  Surgeon: Lennette Bihari, MD;  Location: [facility] CATH LAB;  Service: Cardiovascular;  Laterality: N/A;   NASAL SEPTOPLASTY W/ TURBINOPLASTY  ~ 2007   NM MYOVIEW LTD  08/22/2014    Low risk stress nuclear study with a small, severe, fixed defect in the distal inferior wall/apex suggestive of small prior infarct; no ischemia.  LV Wall Motion:  NL LV Function; NL Wall Motion   PERCUTANEOUS CORONARY STENT INTERVENTION (PCI-S) N/A 10/03/2013   cutting balloon angioplasty only no stent.    SINOSCOPY     TRANSTHORACIC ECHOCARDIOGRAM  10/02/2013   EF 55-60%; mild LVH, no RWMA,    TUBAL LIGATION  1989   Patient Active Problem List   Diagnosis Date Noted   Coronary artery disease involving native heart with angina pectoris, unspecified vessel or lesion type (HCC) 10/29/2021   Perineal  irritation in female 08/15/2021   Dysuria 08/15/2021   Plantar fasciitis of right foot 07/04/2021   Trochanteric bursitis of left hip 04/08/2021   Pain of left middle finger 04/08/2021   Other allergic rhinitis 01/01/2021   Frequent episodes of sinusitis 01/01/2021   History of bronchitis 01/01/2021   Allergic conjunctivitis of both eyes 01/01/2021   Family history of breast cancer in sister 09/20/2020   Type 2 diabetes mellitus with other circulatory complications, HTN, CVD (HCC) 05/17/2020   Morbid obesity (HCC) 05/17/2020   BMI 40.0-44.9, adult (HCC) 05/17/2020   Acute non-recurrent maxillary sinusitis 11/04/2016   Acute pain of both knees 08/28/2016    OSA (obstructive sleep apnea) 06/10/2016   Costochondritis 06/09/2016   Bilateral leg cramps 02/07/2016   Peripheral edema 01/29/2016   Hypertension associated with diabetes (HCC)    GERD (gastroesophageal reflux disease) 08/10/2014   Menopausal syndrome 08/10/2014   CAD S/P percutaneous coronary angioplasty - Ostial AVG Cx - Scoring balloon PTCA 10/03/2013   Hyperlipidemia with target LDL less than 70 10/02/2013   H/O non-ST elevation myocardial infarction (NSTEMI) 10/02/2013   Normocytic anemia, not due to blood loss    Allergic sinusitis 03/17/2007   MIGRAINE, COMMON W/O INTRACTABLE MIGRAINE 03/16/2007    PCP: Excell Seltzer MD  REFERRING PROVIDER: Cammy Copa, MD  REFERRING DIAG: [phone] (ICD-10-CM) - Complex tear of lateral meniscus of left knee as current injury, subsequent encounter M25.562 (ICD-10-CM) - Left knee pain, unspecified chronicity M25.462 (ICD-10-CM) - Effusion, left knee  THERAPY DIAG:  Acute pain of left knee - Plan: PT plan of care cert/re-cert  Localized edema - Plan: PT plan of care cert/re-cert  Stiffness of left knee, not elsewhere classified - Plan: PT plan of care cert/re-cert  Muscle weakness (generalized) - Plan: PT plan of care cert/re-cert  Other abnormalities of gait and mobility - Plan: PT plan of care cert/re-cert  Rationale for Evaluation and Treatment: Rehabilitation  ONSET DATE: Surgery 08/10/2022  SUBJECTIVE:   SUBJECTIVE STATEMENT: She reported progressive swelling and complaints in WB activity with consistent pain reported.  Had to switch back to walker due to symptoms presentation.    PERTINENT HISTORY: Anemia, CAD, DM, HTN, HLD, hx NSTEMI, hx migraines  PAIN:  NPRS scale:  at current 7/10, at best 7/10, at worst 9/10 Pain location: Lt knee  Pain description: sore Aggravating factors: WB activity, swelling reponse.  Relieving factors: repositioning, ice  PRECAUTIONS: Meniscus repair.   WEIGHT BEARING  RESTRICTIONS: PWB for 2 weeks (ending 09/21/2022 per MD note then FWB after)  FALLS:  Has patient fallen in last 6 months? No  LIVING ENVIRONMENT: Lives with: lives alone and currently living at son's house (1 step to enter his home, and staying downstairs) Lives in: House/apartment Stairs: No Has following equipment at home: Single point cane and Std W  OCCUPATION: Research scientist (medical); currently not working  PLOF: Independent; spend time with grandchildren, go out with friends; travel, no regular exercise  PATIENT GOALS: improve knee function, walk without device   OBJECTIVE:   PATIENT SURVEYS:  10/05/22: FOTO 52 09/11/22: FOTO 29 (predicted 53)   COGNITION: Overall cognitive status: WFL   EDEMA:  09/11/22: not formally measured but Lt knee and lower leg edema present  POSTURE: rounded shoulders and forward head  LOWER EXTREMITY ROM:   ROM Left eval Left 09/14/2022 Left 09/28/22 Left 10/05/22 Left 10/19/2022 Lt 10/26/2022  Knee flexion A: 87 P:90 AROM in supine heel slide: 102 A: 111 A:  115 110 AROM in supine heel slide limited by pain 1000 AROM in supine heel slide c pain  Knee extension A: -5 (LAQ) P: 0  A: -3 (LAQ) A: -2 (LAQ)  -8 AROM in seated LAQ  PROM in supine heel prop   (Blank rows = not tested)  LOWER EXTREMITY MMT:  Eval: Deferred formal testing; Lt knee grossly 3-/5  MMT Right 09/21/2022 Left 09/21/2022 Lt 10/26/2022  Hip flexion 5/5 5/5   Knee flexion 5/5 5/5 5/5  Knee extension 5/5 4/5 4+/5   (Blank rows = not tested)  FUNCTIONAL TESTS:  09/11/22:  5x STS: 24.12 sec with UE support needed  GAIT: 10/26/2022 : Ambulation into clinic c FWW step through pattern c reduced gait speed.    10/19/2022:  SPC use in Rt UE with antalgic gait with reduced stance on Lt leg, reduced step length with Rt leg, decreased gait speed.   09/21/2022:  SPC use in Rt UE in clinic level surfaces < 150 ft with supervision.   09/11/22 Distance walked:  100' Assistive device utilized:  Std W Level of assistance: Modified independence Comments: antalgic gait; decreased velocity  TODAY'S TREATMENT:                                                 DATE:10/26/2022 Therex: Nustep Lvl 6 10 mins UE/LE  Seated Lt knee LAQ c pause in end ranges 2 x 15 0 lbs Supine Lt AROM heel slide x 10 5 sec hold    Vasopneumatic  15 mins Lt knee in elevation medium compression 34 degrees  TODAY'S TREATMENT:                                                 DATE:10/19/2022 Therex: SciFit bike seat 14, lvl 3.0, 10 mins  Knee extension machine double leg up, Lt leg lowering 2 x 10 5 lbs  Leg press Double leg 87 lbs x 15, single leg 73 lbs 2 x 15 Lt leg Seated SLR 2 x 10 c slow movement focus Supine Lt AROM heel slide x 5   Additional time spent in education of routine HEP for mobility/strengthening.   Vasopneumatic  10 mins Lt knee in elevation medium compression 34 degrees  TODAY'S TREATMENT:                                                 DATE:10/12/2022 Therex: SciFit bike seat 12 x 8 min; L3 LAQ on Lt; 3 sec hold 3x10; 5# Seated Lt SLR 2x10 Standing hip abduction working on decreasing UE support x10 bil Standing hip extension working on decreasing UE support x 10 bil Calf raises x 20 reps bil UE support    Vasopneumatic  10 mins Lt knee in elevation medium compression 34 degrees   TODAY'S TREATMENT:  DATE:10/07/2022 Therex: Recumbent bike lvl 1 8 mins ROM, seat 9 Incline gastroc stretch 30 sec x 3 LAQ 3 lbs (reduced due to arrival pain) with pause in end ranges 2 x 15 Seated SLR 2 x 10 Lt  Supine LAQ with hip flexion in 90 deg x 10    Vasopneumatic  10 mins Lt knee in elevation medium compression 34 degrees   PATIENT EDUCATION:  09/17/2022 Education details: HEP update Person educated: Patient Education method: Programmer, multimedia, Demonstration, Verbal cues, and Handouts Education comprehension:  verbalized understanding, returned demonstration, and verbal cues required  HOME EXERCISE PROGRAM: Access Code: [email or web address] URL: [email or web address] Date: 09/17/2022 Prepared by: Chyrel Masson  Exercises - Supine Heel Slide with Strap  - 5-10 x daily - 7 x weekly - 1 sets - 5-10 reps - Seated Knee Extension AROM (Mirrored)  - 3-5 x daily - 7 x weekly - 1-2 sets - 10 reps - 5 sec hold - Seated Knee Flexion Stretch (Mirrored)  - 3-5 x daily - 7 x weekly - 1-2 sets - 10 reps - 5 sec hold - Seated Quad Set (Mirrored)  - 3-5 x daily - 7 x weekly - 1 sets - 10 reps - 5 hold - Supine 90/90 Sciatic Nerve Glide with Knee Flexion/Extension  - 1-2 x daily - 7 x weekly - 1-2 sets - 10 reps - Seated Straight Leg Heel Taps  - 1-2 x daily - 7 x weekly - 3 sets - 10 reps  ASSESSMENT:  CLINICAL IMPRESSION: Pt has attended 12 visits overall during course of treatment cycle.  Pt had initially shown progres in reduced dependency on walker with transitioning to [facility] use and improvement in WB activity tolerance but in last few weeks she has experienced increased symptoms in WB activity, increased edema knee and overall increased difficulty with activity.  Most recent MD visit resulted in drawing of fluid that has seemingly returned.  See objective data for updated information.  Plan to hold PT until MD visit on 10/28/2022 with return pending MD plan of care adjustments according to presentation.   OBJECTIVE IMPAIRMENTS: Abnormal gait, decreased activity tolerance, decreased balance, decreased endurance, decreased knowledge of use of DME, decreased mobility, difficulty walking, decreased ROM, decreased strength, increased edema, increased fascial restrictions, and pain.   ACTIVITY LIMITATIONS: carrying, lifting, bending, sitting, standing, squatting, sleeping, stairs, transfers, bed mobility, and locomotion level  PARTICIPATION LIMITATIONS: meal prep, cleaning, laundry, driving, shopping, community  activity, and occupation  PERSONAL FACTORS: 3+ comorbidities: Anemia, CAD, DM, HTN, HLD, hx NSTEMI, hx migraines  are also affecting patient's functional outcome.   REHAB POTENTIAL: Good  CLINICAL DECISION MAKING: Stable/uncomplicated  EVALUATION COMPLEXITY: Low   GOALS: Goals reviewed with patient? Yes  SHORT TERM GOALS: (target date for Short term goals are 3 weeks 10/02/2022)  1.  Patient will demonstrate independent use of home exercise program to maintain progress from in clinic treatments. Goal status: met  LONG TERM GOALS: (target dates for all long term goals: 12/21/2022  (revised)    1. Patient will demonstrate/report pain at worst less than or equal to 2/10 to facilitate minimal limitation in daily activity secondary to pain symptoms. Goal status: on going 10/26/2022   2. Patient will demonstrate independent use of home exercise program to facilitate ability to maintain/progress functional gains from skilled physical therapy services. Goal status: on going 10/26/2022   3. Patient will demonstrate FOTO outcome > or = 53 % to indicate reduced disability due to condition. Goal status: on going  10/26/2022   4.  Patient will demonstrate Lt LE MMT 5/5 throughout to faciltiate usual transfers, stairs, squatting at [facility] for daily life.  Goal status: on going 10/26/2022   5.  Patient will demonstrate Lt knee AROM 0-110 deg to facilitate transfers, ambulation, bending for daily activity at PLOF s limitation.  Goal status: on going 10/26/2022   6.  Improve 5x STS to < 20 sec without UE support for improved functional strength Goal status: on going 10/26/2022  PLAN:  PT FREQUENCY: 1-2x/week  PT DURATION: 8 weeks  PLANNED INTERVENTIONS: Therapeutic exercises, Therapeutic activity, Neuro Muscular re-education, Balance training, Gait training, Patient/Family education, Joint mobilization, Stair training, DME instructions, Dry Needling, Electrical stimulation, Traction, Cryotherapy,  vasopneumatic deviceMoist heat, Taping, Ultrasound, Ionotophoresis 4mg /ml Dexamethasone, and aquatic therapy, Manual therapy.  All included unless contraindicated  PLAN FOR NEXT SESSION:   Follow up on MD visit and any changes to POC accordingly.    Chyrel Masson, PT, DPT, OCS, ATC 10/26/22  8:27 AM   PHYSICAL THERAPY DISCHARGE SUMMARY  Visits from Start of Care: 12  Current functional level related to goals / functional outcomes: See note   Remaining deficits: See note   Education / Equipment: HEP  Patient goals were partially met. Patient is being discharged due to the physician's request.  Chyrel Masson, PT, DPT, OCS, ATC 12/17/22  9:45 AM

## 2022-10-28 ENCOUNTER — Ambulatory Visit (INDEPENDENT_AMBULATORY_CARE_PROVIDER_SITE_OTHER): Payer: BC Managed Care – PPO | Admitting: Orthopedic Surgery

## 2022-10-28 ENCOUNTER — Encounter: Payer: BC Managed Care – PPO | Admitting: Rehabilitative and Restorative Service Providers"

## 2022-10-28 DIAGNOSIS — S83272D Complex tear of lateral meniscus, current injury, left knee, subsequent encounter: Secondary | ICD-10-CM

## 2022-10-30 NOTE — Progress Notes (Unsigned)
Post-Op Visit Note   Patient: Christine Barrett           Date of Birth: 1961-02-08           MRN: [id number] Visit Date: 10/28/2022 PCP: Excell Seltzer, MD   Assessment & Plan:  Chief Complaint:  Chief Complaint  Patient presents with   Left Knee - Follow-up   Visit Diagnoses:  1. Complex tear of lateral meniscus of left knee as current injury, subsequent encounter     Plan: Christine Barrett is a 62 year old patient with left knee pain.  Had aspiration done 10/05/2022.  States she is not really doing that well.  Having significant pain and swelling.  Had anterior horn lateral meniscal repair.  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  She is doing physical therapy as well.  She is almost 3 months out from her surgery.  On examination she does have recurrent effusion and antalgic gait to the left.  No calf tenderness negative Homans.  I think that it is possible that she has progressive arthritis in that lateral compartment.  Prior MRI scan is reviewed and the main finding was significant anterior horn lateral meniscal degeneration.  Plan is repeat MRI scan just to assess for rapid degenerative arthritis in that lateral compartment versus edema and bone bruising indicating progressive wear and tear.  That might's lead [place] towards consideration for further intervention versus living with what she has as long as possible.  Follow-up after that scan.  Left knee is also aspirated today with about 40 cc obtained.  Follow-Up Instructions: No follow-ups on file.   Orders:  Orders Placed This Encounter  Procedures   MR Knee Left w/o contrast   No orders of the defined types were placed in this encounter.   Imaging: No results found.  PMFS History: Patient Active Problem List   Diagnosis Date Noted   Coronary artery disease involving native heart with angina pectoris, unspecified vessel or lesion type (HCC) 10/29/2021   Perineal irritation in female 08/15/2021    Dysuria 08/15/2021   Plantar fasciitis of right foot 07/04/2021   Trochanteric bursitis of left hip 04/08/2021   Pain of left middle finger 04/08/2021   Other allergic rhinitis 01/01/2021   Frequent episodes of sinusitis 01/01/2021   History of bronchitis 01/01/2021   Allergic conjunctivitis of both eyes 01/01/2021   Family history of breast cancer in sister 09/20/2020   Type 2 diabetes mellitus with other circulatory complications, HTN, CVD (HCC) 05/17/2020   Morbid obesity (HCC) 05/17/2020   BMI 40.0-44.9, adult (HCC) 05/17/2020   Acute non-recurrent maxillary sinusitis 11/04/2016   Acute pain of both knees 08/28/2016   OSA (obstructive sleep apnea) 06/10/2016   Costochondritis 06/09/2016   Bilateral leg cramps 02/07/2016   Peripheral edema 01/29/2016   Hypertension associated with diabetes (HCC)    GERD (gastroesophageal reflux disease) 08/10/2014   Menopausal syndrome 08/10/2014   CAD S/P percutaneous coronary angioplasty - Ostial AVG Cx - Scoring balloon PTCA 10/03/2013   Hyperlipidemia with target LDL less than 70 10/02/2013   H/O non-ST elevation myocardial infarction (NSTEMI) 10/02/2013   Normocytic anemia, not due to blood loss    Allergic sinusitis 03/17/2007   MIGRAINE, COMMON W/O INTRACTABLE MIGRAINE 03/16/2007   Past Medical History:  Diagnosis Date   Allergy    SEASONAL   Anemia    CAD S/P percutaneous coronary angioplasty 09/2013   a) Ostial AV G Cx -  2.5 mm Angiosculpt; mid LAD 40-60%; b) Myoview 07/2014: LOW RISK, small-severe fixed inferior defect c/w infarct w/o peri-infarct ischemia.   Chronic bronchitis (HCC)    "frequently; not q yr" (10/03/2013)   Diabetes mellitus without complication (HCC)    Type 2-diet controlled.    Diverticulosis    Essential hypertension    with prior Accelerated HTN   GERD (gastroesophageal reflux disease)    Hiatal hernia    Hx of non-ST elevation myocardial infarction (NSTEMI) 10/01/2013   Due to Accelerated HTN with existing  CAD   Hyperlipemia    Migraine    "@ least once/month" (10/03/2013)   OSA (obstructive sleep apnea) 06/10/2016   Schatzki's ring    Seasonal allergies    Sinusitis    Spinal headache    during C-section of second child.     Family History  Adopted: Yes  Problem Relation Age of Onset   Allergic rhinitis Mother    Diabetes Mother    Sinusitis Mother    Breast cancer Sister    Hypertension Sister    Colon cancer Neg Hx    Heart attack Neg Hx    Stroke Neg Hx     Past Surgical History:  Procedure Laterality Date   ABDOMINAL HYSTERECTOMY  1994   "partial"   CAPSULAR RELEASE Right 06/06/2020   Procedure: RIGHT SHOULDER MANIPULATION UNDER ANESTHESIA, ROTATOR CUFF REPAIR;  Surgeon: Cammy Copa, MD;  Location: [facility] OR;  Service: Orthopedics;  Laterality: Right;   CARDIAC CATHETERIZATION N/A 08/05/2015   Procedure: Left Heart Cath and Coronary Angiography;  Surgeon: Marykay Lex, MD;  Location: [facility] INVASIVE CV LAB;  Service: Cardiovascular;  Laterality: N/A;   CESAREAN SECTION  1989   COLONOSCOPY     CORONARY ANGIOPLASTY  10/03/2013   95% ostial AV G Cx - 2.5 mm AngioSculpt Balloon PTCA; mid LAD 40-60%   LEFT HEART CATHETERIZATION WITH CORONARY ANGIOGRAM N/A 10/02/2013   Procedure: LEFT HEART CATHETERIZATION WITH CORONARY ANGIOGRAM;  Surgeon: Lennette Bihari, MD;  Location: [facility] CATH LAB;  Service: Cardiovascular;  Laterality: N/A;   NASAL SEPTOPLASTY W/ TURBINOPLASTY  ~ 2007   NM MYOVIEW LTD  08/22/2014    Low risk stress nuclear study with a small, severe, fixed defect in the distal inferior wall/apex suggestive of small prior infarct; no ischemia.  LV Wall Motion:  NL LV Function; NL Wall Motion   PERCUTANEOUS CORONARY STENT INTERVENTION (PCI-S) N/A 10/03/2013   cutting balloon angioplasty only no stent.    SINOSCOPY     TRANSTHORACIC ECHOCARDIOGRAM  10/02/2013   EF 55-60%; mild LVH, no RWMA,    TUBAL LIGATION  1989   Social History   Occupational History   Occupation:  Biomedical engineer  Tobacco Use   Smoking status: Never    Passive exposure: Never   Smokeless tobacco: Never  Vaping Use   Vaping Use: Never used  Substance and Sexual Activity   Alcohol use: Yes    Alcohol/week: 0.0 standard drinks of alcohol    Comment: occ   Drug use: No   Sexual activity: Yes    Birth control/protection: Surgical

## 2022-10-31 ENCOUNTER — Encounter: Payer: Self-pay | Admitting: Orthopedic Surgery

## 2022-11-02 ENCOUNTER — Other Ambulatory Visit: Payer: Self-pay | Admitting: Family Medicine

## 2022-11-02 ENCOUNTER — Encounter: Payer: BC Managed Care – PPO | Admitting: Physical Therapy

## 2022-11-02 NOTE — Telephone Encounter (Signed)
Last refill 08/25/22  2 ml/11 Last office visit 08/25/22

## 2022-11-04 ENCOUNTER — Encounter: Payer: BC Managed Care – PPO | Admitting: Rehabilitative and Restorative Service Providers"

## 2022-11-09 ENCOUNTER — Other Ambulatory Visit: Payer: Self-pay | Admitting: *Deleted

## 2022-11-09 DIAGNOSIS — E1159 Type 2 diabetes mellitus with other circulatory complications: Secondary | ICD-10-CM

## 2022-11-09 MED ORDER — ONETOUCH VERIO VI STRP: ORAL_STRIP | 3 refills | Status: AC

## 2022-11-09 MED ORDER — ONETOUCH DELICA PLUS LANCET30G MISC: 3 refills | Status: AC

## 2022-11-09 MED ORDER — ISOSORBIDE MONONITRATE ER 60 MG PO TB24: 120.0000 mg | ORAL_TABLET | Freq: Every day | ORAL | 0 refills | Status: DC

## 2022-11-09 NOTE — Addendum Note (Signed)
Addended by: Damita Lack on: 11/09/2022 04:46 PM   Modules accepted: Orders

## 2022-11-09 NOTE — Telephone Encounter (Signed)
Last office visit 08/25/22 for peripheral edema.  Last refilled 08/25/2022 for #15 with no refills.  Previous AVS States:  Elevated legs above heart as much able.  Tomorrow..Hold HCTZ.[name] start furosemide 20 mg daily. Take a potassium tablet with it.  Continue daily for the next 3-4 days.. Call with update.  Follow Blood pressure daily. [facility] 90 day supply.  Next Appt: CPE 11/20/2022.

## 2022-11-10 ENCOUNTER — Encounter: Payer: BC Managed Care – PPO | Admitting: Physical Therapy

## 2022-11-10 NOTE — Telephone Encounter (Signed)
Call Long-term use of furosemide may not be appropriate. I would suggest if she feels she has had significant improvement with furosemide 20 mg EVERY DAY along with the potassium tablet, that we have her come in to recheck her kidney function and potassium levels.  If she is just using the furosemide 20 mg prn here and there this is likely not needed and I will refill smaller amount

## 2022-11-11 ENCOUNTER — Encounter: Payer: BC Managed Care – PPO | Admitting: Rehabilitative and Restorative Service Providers"

## 2022-11-11 MED ORDER — MOUNJARO 2.5 MG/0.5ML ~~LOC~~ SOAJ: SUBCUTANEOUS | 0 refills | Status: DC

## 2022-11-11 NOTE — Addendum Note (Signed)
Addended by: Damita Lack on: 11/11/2022 10:04 AM   Modules accepted: Orders

## 2022-11-11 NOTE — Telephone Encounter (Signed)
Spoke with [employer].  She states she is not taking the Lasix.  She states once she finished the first prescription that Dr. Ermalene Searing gave her, she went back on her HCTZ.   Medication list updated.   While  on the phone,  patient states she is changing jobs soon and will be losing her insurance.  She ask if we could send in a 90 day supply of Mounjaro to [facility] to try and get her through insurance change.  Refill sent in as requested.  FYI to Dr. Ermalene Searing.

## 2022-11-12 DIAGNOSIS — H401131 Primary open-angle glaucoma, bilateral, mild stage: Secondary | ICD-10-CM | POA: Diagnosis not present

## 2022-11-13 ENCOUNTER — Telehealth: Payer: Self-pay | Admitting: Radiology

## 2022-11-13 ENCOUNTER — Other Ambulatory Visit (INDEPENDENT_AMBULATORY_CARE_PROVIDER_SITE_OTHER): Payer: BC Managed Care – PPO

## 2022-11-13 DIAGNOSIS — E1159 Type 2 diabetes mellitus with other circulatory complications: Secondary | ICD-10-CM

## 2022-11-13 DIAGNOSIS — Z7985 Long-term (current) use of injectable non-insulin antidiabetic drugs: Secondary | ICD-10-CM

## 2022-11-13 LAB — COMPREHENSIVE METABOLIC PANEL
ALT: 16 U/L (ref 0–35)
AST: 17 U/L (ref 0–37)
Albumin: 4.3 g/dL (ref 3.5–5.2)
Alkaline Phosphatase: 82 U/L (ref 39–117)
BUN: 15 mg/dL (ref 6–23)
CO2: 27 mEq/L (ref 19–32)
Calcium: 9.7 mg/dL (ref 8.4–10.5)
Chloride: 105 mEq/L (ref 96–112)
Creatinine, Ser: 0.84 mg/dL (ref 0.40–1.20)
GFR: 75.19 mL/min (ref >60.00)
Glucose, Bld: 111 mg/dL — ABNORMAL HIGH (ref 70–99)
Potassium: 3.7 mEq/L (ref 3.5–5.1)
Sodium: 139 mEq/L (ref 135–145)
Total Bilirubin: 0.7 mg/dL (ref 0.2–1.2)
Total Protein: 7.7 g/dL (ref 6.0–8.3)

## 2022-11-13 LAB — HEMOGLOBIN A1C: Hgb A1c MFr Bld: 6.5 % (ref 4.6–6.5)

## 2022-11-13 LAB — MICROALBUMIN / CREATININE URINE RATIO
Creatinine,U: 260.6 mg/dL
Microalb Creat Ratio: 0.6 mg/g (ref 0.0–30.0)
Microalb, Ur: 1.6 mg/dL (ref 0.0–1.9)

## 2022-11-13 LAB — LIPID PANEL
Cholesterol: 248 mg/dL — ABNORMAL HIGH (ref 0–200)
HDL: 48.3 mg/dL (ref >39.00)
LDL Cholesterol: 165 mg/dL — ABNORMAL HIGH (ref 0–99)
NonHDL: 199.24
Total CHOL/HDL Ratio: 5
Triglycerides: 173 mg/dL — ABNORMAL HIGH (ref 0.0–149.0)
VLDL: 34.6 mg/dL (ref 0.0–40.0)

## 2022-11-13 NOTE — Progress Notes (Signed)
No critical labs need to be addressed urgently. We will discuss labs in detail at upcoming office visit.   

## 2022-11-13 NOTE — Telephone Encounter (Signed)
I called patient and advised. She already has 90 day supply and was taking this to prevent heart attack. She is unsure why rx request was sent here. Advised we would disregard. She expressed understanding.

## 2022-11-13 NOTE — Telephone Encounter (Signed)
I dont think she needs this unless her PCP wants her to take it.  Originally was just prescribed to prevent DVT after surgery

## 2022-11-13 NOTE — Telephone Encounter (Signed)
Received fax from [facility] that patient is requesting 90 day prescription for Aspirin Low Dose Chew Tabs 81mg .  Aspirin low dose chew tabs 81mg  90 day supply 3 refills  Please advise.

## 2022-11-15 ENCOUNTER — Ambulatory Visit
Admission: RE | Admit: 2022-11-15 | Discharge: 2022-11-15 | Disposition: A | Discharge status: Home / Self Care | Payer: BC Managed Care – PPO | Source: Ambulatory Visit | Attending: Orthopedic Surgery | Admitting: Orthopedic Surgery

## 2022-11-15 DIAGNOSIS — M23342 Other meniscus derangements, anterior horn of lateral meniscus, left knee: Secondary | ICD-10-CM | POA: Diagnosis not present

## 2022-11-15 DIAGNOSIS — S83272D Complex tear of lateral meniscus, current injury, left knee, subsequent encounter: Secondary | ICD-10-CM

## 2022-11-16 ENCOUNTER — Encounter: Payer: BC Managed Care – PPO | Admitting: Physical Therapy

## 2022-11-18 ENCOUNTER — Encounter: Payer: Self-pay | Admitting: Orthopedic Surgery

## 2022-11-18 ENCOUNTER — Ambulatory Visit (INDEPENDENT_AMBULATORY_CARE_PROVIDER_SITE_OTHER): Payer: BC Managed Care – PPO | Admitting: Orthopedic Surgery

## 2022-11-18 ENCOUNTER — Telehealth: Payer: Self-pay | Admitting: Orthopedic Surgery

## 2022-11-18 ENCOUNTER — Encounter: Payer: BC Managed Care – PPO | Admitting: Physical Therapy

## 2022-11-18 DIAGNOSIS — S83272D Complex tear of lateral meniscus, current injury, left knee, subsequent encounter: Secondary | ICD-10-CM

## 2022-11-18 NOTE — Progress Notes (Deleted)
Post-Op Visit Note   Patient: Christine Barrett           Date of Birth: 1960/09/02           MRN: [id number] Visit Date: 11/18/2022 PCP: Excell Seltzer, MD   Assessment & Plan:  Chief Complaint:  Chief Complaint  Patient presents with   Left Knee - Follow-up    MRI review   Visit Diagnoses:  1. Complex tear of lateral meniscus of left knee as current injury, subsequent encounter     Plan: ***  Follow-Up Instructions: No follow-ups on file.   Orders:  No orders of the defined types were placed in this encounter.  No orders of the defined types were placed in this encounter.   Imaging: No results found.  PMFS History: Patient Active Problem List   Diagnosis Date Noted   Coronary artery disease involving native heart with angina pectoris, unspecified vessel or lesion type (HCC) 10/29/2021   Perineal irritation in female 08/15/2021   Dysuria 08/15/2021   Plantar fasciitis of right foot 07/04/2021   Trochanteric bursitis of left hip 04/08/2021   Pain of left middle finger 04/08/2021   Other allergic rhinitis 01/01/2021   Frequent episodes of sinusitis 01/01/2021   History of bronchitis 01/01/2021   Allergic conjunctivitis of both eyes 01/01/2021   Family history of breast cancer in sister 09/20/2020   Type 2 diabetes mellitus with other circulatory complications, HTN, CVD (HCC) 05/17/2020   Morbid obesity (HCC) 05/17/2020   BMI 40.0-44.9, adult (HCC) 05/17/2020   Acute non-recurrent maxillary sinusitis 11/04/2016   Acute pain of both knees 08/28/2016   OSA (obstructive sleep apnea) 06/10/2016   Costochondritis 06/09/2016   Bilateral leg cramps 02/07/2016   Peripheral edema 01/29/2016   Hypertension associated with diabetes (HCC)    GERD (gastroesophageal reflux disease) 08/10/2014   Menopausal syndrome 08/10/2014   CAD S/P percutaneous coronary angioplasty - Ostial AVG Cx - Scoring balloon PTCA 10/03/2013   Hyperlipidemia with target LDL less than 70 10/02/2013    H/O non-ST elevation myocardial infarction (NSTEMI) 10/02/2013   Normocytic anemia, not due to blood loss    Allergic sinusitis 03/17/2007   MIGRAINE, COMMON W/O INTRACTABLE MIGRAINE 03/16/2007   Past Medical History:  Diagnosis Date   Allergy    SEASONAL   Anemia    CAD S/P percutaneous coronary angioplasty 09/2013   a) Ostial AV G Cx - 2.5 mm Angiosculpt; mid LAD 40-60%; b) Myoview 07/2014: LOW RISK, small-severe fixed inferior defect c/w infarct w/o peri-infarct ischemia.   Chronic bronchitis (HCC)    "frequently; not q yr" (10/03/2013)   Diabetes mellitus without complication (HCC)    Type 2-diet controlled.    Diverticulosis    Essential hypertension    with prior Accelerated HTN   GERD (gastroesophageal reflux disease)    Hiatal hernia    Hx of non-ST elevation myocardial infarction (NSTEMI) 10/01/2013   Due to Accelerated HTN with existing CAD   Hyperlipemia    Migraine    "@ least once/month" (10/03/2013)   OSA (obstructive sleep apnea) 06/10/2016   Schatzki's ring    Seasonal allergies    Sinusitis    Spinal headache    during C-section of second child.     Family History  Adopted: Yes  Problem Relation Age of Onset   Allergic rhinitis Mother    Diabetes Mother    Sinusitis Mother    Breast cancer Sister    Hypertension Sister    Colon cancer  Neg Hx    Heart attack Neg Hx    Stroke Neg Hx     Past Surgical History:  Procedure Laterality Date   ABDOMINAL HYSTERECTOMY  1994   "partial"   CAPSULAR RELEASE Right 06/06/2020   Procedure: RIGHT SHOULDER MANIPULATION UNDER ANESTHESIA, ROTATOR CUFF REPAIR;  Surgeon: Cammy Copa, MD;  Location: [facility] OR;  Service: Orthopedics;  Laterality: Right;   CARDIAC CATHETERIZATION N/A 08/05/2015   Procedure: Left Heart Cath and Coronary Angiography;  Surgeon: Marykay Lex, MD;  Location: [facility] INVASIVE CV LAB;  Service: Cardiovascular;  Laterality: N/A;   CESAREAN SECTION  1989   COLONOSCOPY     CORONARY ANGIOPLASTY   10/03/2013   95% ostial AV G Cx - 2.5 mm AngioSculpt Balloon PTCA; mid LAD 40-60%   LEFT HEART CATHETERIZATION WITH CORONARY ANGIOGRAM N/A 10/02/2013   Procedure: LEFT HEART CATHETERIZATION WITH CORONARY ANGIOGRAM;  Surgeon: Lennette Bihari, MD;  Location: [facility] CATH LAB;  Service: Cardiovascular;  Laterality: N/A;   NASAL SEPTOPLASTY W/ TURBINOPLASTY  ~ 2007   NM MYOVIEW LTD  08/22/2014    Low risk stress nuclear study with a small, severe, fixed defect in the distal inferior wall/apex suggestive of small prior infarct; no ischemia.  LV Wall Motion:  NL LV Function; NL Wall Motion   PERCUTANEOUS CORONARY STENT INTERVENTION (PCI-S) N/A 10/03/2013   cutting balloon angioplasty only no stent.    SINOSCOPY     TRANSTHORACIC ECHOCARDIOGRAM  10/02/2013   EF 55-60%; mild LVH, no RWMA,    TUBAL LIGATION  1989   Social History   Occupational History   Occupation: Biomedical engineer  Tobacco Use   Smoking status: Never    Passive exposure: Never   Smokeless tobacco: Never  Vaping Use   Vaping Use: Never used  Substance and Sexual Activity   Alcohol use: Yes    Alcohol/week: 0.0 standard drinks of alcohol    Comment: occ   Drug use: No   Sexual activity: Yes    Birth control/protection: Surgical

## 2022-11-18 NOTE — Telephone Encounter (Signed)
VOB submitted for Monovisc, left knee 

## 2022-11-18 NOTE — Progress Notes (Signed)
Post-Op Visit Note   Patient: Christine Barrett           Date of Birth: [birth year]           MRN: [id number] Visit Date: 11/18/2022 PCP: Excell Seltzer, MD   Assessment & Plan:  Chief Complaint:  Chief Complaint  Patient presents with   Left Knee - Follow-up    MRI review   Visit Diagnoses:  1. Complex tear of lateral meniscus of left knee as current injury, subsequent encounter     Plan: Christine Barrett is a patient is now about 5 months out from left knee anterior horn meniscal repair.  Been having some pain and swelling.  Since she was last seen she had an MRI scan of the left knee.  That is reviewed.  Shows reasonably good position of that meniscal repair of that anterior horn.  Effusion is present.  No bone edema.  That on the lateral side.  Overall I think that the meniscal repair looks good but it is likely only functioning at about 50% meniscal cushion ability which is giving her some recurrent pain and swelling.  I think she is having some degenerative changes going on in that lateral compartment of the left knee.  She does have a mild effusion today.  Would like to plan to try aspiration with gel injection to see if we can get some pain relief. This patient is diagnosed with osteoarthritis of the knee(s).    Radiographs show evidence of joint space narrowing, osteophytes, subchondral sclerosis and/or subchondral cysts.  This patient has knee pain which interferes with functional and activities of daily living.    This patient has experienced inadequate response, adverse effects and/or intolerance with conservative treatments such as acetaminophen, NSAIDS, topical creams, physical therapy or regular exercise, knee bracing and/or weight loss.   This patient has experienced inadequate response or has a contraindication to intra articular steroid injections for at least 3 months.   This patient is not scheduled to have a total knee replacement within 6 months of starting treatment with  viscosupplementation.   Follow-Up Instructions: No follow-ups on file.   Orders:  No orders of the defined types were placed in this encounter.  No orders of the defined types were placed in this encounter.   Imaging: No results found.  PMFS History: Patient Active Problem List   Diagnosis Date Noted   Coronary artery disease involving native heart with angina pectoris, unspecified vessel or lesion type (HCC) 10/29/2021   Perineal irritation in female 08/15/2021   Dysuria 08/15/2021   Plantar fasciitis of right foot 07/04/2021   Trochanteric bursitis of left hip 04/08/2021   Pain of left middle finger 04/08/2021   Other allergic rhinitis 01/01/2021   Frequent episodes of sinusitis 01/01/2021   History of bronchitis 01/01/2021   Allergic conjunctivitis of both eyes 01/01/2021   Family history of breast cancer in sister 09/20/2020   Type 2 diabetes mellitus with other circulatory complications, HTN, CVD (HCC) 05/17/2020   Morbid obesity (HCC) 05/17/2020   BMI 40.0-44.9, adult (HCC) 05/17/2020   Acute non-recurrent maxillary sinusitis 11/04/2016   Acute pain of both knees 08/28/2016   OSA (obstructive sleep apnea) 06/10/2016   Costochondritis 06/09/2016   Bilateral leg cramps 02/07/2016   Peripheral edema 01/29/2016   Hypertension associated with diabetes (HCC)    GERD (gastroesophageal reflux disease) 08/10/2014   Menopausal syndrome 08/10/2014   CAD S/P percutaneous coronary angioplasty - Ostial AVG Cx - Scoring balloon PTCA 10/03/2013  Hyperlipidemia with target LDL less than 70 10/02/2013   H/O non-ST elevation myocardial infarction (NSTEMI) 10/02/2013   Normocytic anemia, not due to blood loss    Allergic sinusitis 03/17/2007   MIGRAINE, COMMON W/O INTRACTABLE MIGRAINE 03/16/2007   Past Medical History:  Diagnosis Date   Allergy    SEASONAL   Anemia    CAD S/P percutaneous coronary angioplasty 09/2013   a) Ostial AV G Cx - 2.5 mm Angiosculpt; mid LAD 40-60%; b)  Myoview 07/2014: LOW RISK, small-severe fixed inferior defect c/w infarct w/o peri-infarct ischemia.   Chronic bronchitis (HCC)    "frequently; not q yr" (10/03/2013)   Diabetes mellitus without complication (HCC)    Type 2-diet controlled.    Diverticulosis    Essential hypertension    with prior Accelerated HTN   GERD (gastroesophageal reflux disease)    Hiatal hernia    Hx of non-ST elevation myocardial infarction (NSTEMI) 10/01/2013   Due to Accelerated HTN with existing CAD   Hyperlipemia    Migraine    "@ least once/month" (10/03/2013)   OSA (obstructive sleep apnea) 06/10/2016   Schatzki's ring    Seasonal allergies    Sinusitis    Spinal headache    during C-section of second child.     Family History  Adopted: Yes  Problem Relation Age of Onset   Allergic rhinitis Mother    Diabetes Mother    Sinusitis Mother    Breast cancer Sister    Hypertension Sister    Colon cancer Neg Hx    Heart attack Neg Hx    Stroke Neg Hx     Past Surgical History:  Procedure Laterality Date   ABDOMINAL HYSTERECTOMY  1994   "partial"   CAPSULAR RELEASE Right 06/06/2020   Procedure: RIGHT SHOULDER MANIPULATION UNDER ANESTHESIA, ROTATOR CUFF REPAIR;  Surgeon: Cammy Copa, MD;  Location: [facility] OR;  Service: Orthopedics;  Laterality: Right;   CARDIAC CATHETERIZATION N/A 08/05/2015   Procedure: Left Heart Cath and Coronary Angiography;  Surgeon: Marykay Lex, MD;  Location: [facility] INVASIVE CV LAB;  Service: Cardiovascular;  Laterality: N/A;   CESAREAN SECTION  1989   COLONOSCOPY     CORONARY ANGIOPLASTY  10/03/2013   95% ostial AV G Cx - 2.5 mm AngioSculpt Balloon PTCA; mid LAD 40-60%   LEFT HEART CATHETERIZATION WITH CORONARY ANGIOGRAM N/A 10/02/2013   Procedure: LEFT HEART CATHETERIZATION WITH CORONARY ANGIOGRAM;  Surgeon: Lennette Bihari, MD;  Location: [facility] CATH LAB;  Service: Cardiovascular;  Laterality: N/A;   NASAL SEPTOPLASTY W/ TURBINOPLASTY  ~ 2007   NM MYOVIEW LTD  08/22/2014     Low risk stress nuclear study with a small, severe, fixed defect in the distal inferior wall/apex suggestive of small prior infarct; no ischemia.  LV Wall Motion:  NL LV Function; NL Wall Motion   PERCUTANEOUS CORONARY STENT INTERVENTION (PCI-S) N/A 10/03/2013   cutting balloon angioplasty only no stent.    SINOSCOPY     TRANSTHORACIC ECHOCARDIOGRAM  10/02/2013   EF 55-60%; mild LVH, no RWMA,    TUBAL LIGATION  1989   Social History   Occupational History   Occupation: Biomedical engineer  Tobacco Use   Smoking status: Never    Passive exposure: Never   Smokeless tobacco: Never  Vaping Use   Vaping Use: Never used  Substance and Sexual Activity   Alcohol use: Yes    Alcohol/week: 0.0 standard drinks of alcohol    Comment: occ   Drug use: No  Sexual activity: Yes    Birth control/protection: Surgical

## 2022-11-18 NOTE — Telephone Encounter (Signed)
Patient was seen today for left knee pain with Dr August Saucer. He would like to see if we can get her approved for gel injection. If possible before the end of the month? She changes insurance by June 1st. Thanks April.

## 2022-11-20 ENCOUNTER — Telehealth: Payer: Self-pay

## 2022-11-20 ENCOUNTER — Ambulatory Visit (INDEPENDENT_AMBULATORY_CARE_PROVIDER_SITE_OTHER): Payer: BC Managed Care – PPO | Admitting: Family Medicine

## 2022-11-20 ENCOUNTER — Encounter: Payer: Self-pay | Admitting: Family Medicine

## 2022-11-20 VITALS — BP 136/80 | HR 96 | Temp 98.2°F | Ht 68.5 in | Wt 274.0 lb

## 2022-11-20 DIAGNOSIS — Z7984 Long term (current) use of oral hypoglycemic drugs: Secondary | ICD-10-CM

## 2022-11-20 DIAGNOSIS — E1159 Type 2 diabetes mellitus with other circulatory complications: Secondary | ICD-10-CM | POA: Diagnosis not present

## 2022-11-20 DIAGNOSIS — Z0000 Encounter for general adult medical examination without abnormal findings: Secondary | ICD-10-CM

## 2022-11-20 DIAGNOSIS — E785 Hyperlipidemia, unspecified: Secondary | ICD-10-CM

## 2022-11-20 DIAGNOSIS — Z6841 Body Mass Index (BMI) 40.0 and over, adult: Secondary | ICD-10-CM

## 2022-11-20 DIAGNOSIS — N951 Menopausal and female climacteric states: Secondary | ICD-10-CM

## 2022-11-20 DIAGNOSIS — I152 Hypertension secondary to endocrine disorders: Secondary | ICD-10-CM

## 2022-11-20 LAB — HM DIABETES FOOT EXAM

## 2022-11-20 NOTE — Patient Instructions (Addendum)
Schedule shingrix vaccine when ready.  Can use vaginal lubricant like olive oil, coconut oil, KY jelly, Replens.

## 2022-11-20 NOTE — Assessment & Plan Note (Signed)
Encouraged exercise, weight loss, healthy eating habits. ? ?

## 2022-11-20 NOTE — Progress Notes (Signed)
Patient ID: Christine Barrett, female    DOB: [birth year], 61 y.o.   MRN: [id number]  This visit was conducted in person.  BP 136/80 (BP Location: Left Arm, Patient Position: Sitting, Cuff Size: Large)   Pulse 96   Temp 98.2 F (36.8 C) (Temporal)   Ht 5' 8.5" (1.74 m)   Wt 274 lb (124.3 kg)   SpO2 98%   BMI 41.06 kg/m    CC:  Chief Complaint  Patient presents with   Annual Exam    Subjective:   HPI: Christine Barrett is a 62 y.o. female presenting on 11/20/2022 for Annual Exam  The patient presents for  complete physical and review of chronic health problems. He/She also has the following acute concerns today:   Still dealing with left knee issue.. using cane. Followed by Ortho, Dr. August Saucer.     DM: Excellent control on current regimen. Tolerating Mounjaro well except some diarrhea , occ nausea 2 days after injection. Lab Results  Component Value Date   HGBA1C 6.5 11/13/2022  FBS 112-125 2 hour postprandial  not checking, no numbers > 200     She has lost 10 lbs in last 3 months. Wt Readings from Last 3 Encounters:  11/20/22 274 lb (124.3 kg)  08/05/22 282 lb (127.9 kg)  04/07/22 285 lb 4 oz (129.4 kg)   Elevated Cholesterol: No longer at goal < 70 has been compliant with meds until last month because lost her medicine, also diet not great.  On crestor 20 mg daily  Zetia 10 mg daily Lab Results  Component Value Date   CHOL 248 (H) 11/13/2022   HDL 48.30 11/13/2022   LDLCALC 165 (H) 11/13/2022   LDLDIRECT 158.0 03/16/2022   TRIG 173.0 (H) 11/13/2022   CHOLHDL 5 11/13/2022  Using medications without problems: Muscle aches:  Diet compliance: working on low carb diet. Exercise: walking more as cramps are better. Other complaints:   HTN   Well controlled on current regimen.  BP < 140/90 at home  BP Readings from Last 3 Encounters:  11/20/22 136/80  08/25/22 112/70  08/05/22 130/72   Body mass index is 41.06 kg/m.   Relevant past medical, surgical, family and  social history reviewed and updated as indicated. Interim medical history since our last visit reviewed. Allergies and medications reviewed and updated. Outpatient Medications Prior to Visit  Medication Sig Dispense Refill   albuterol (VENTOLIN HFA) 108 (90 Base) MCG/ACT inhaler INHALE 2 PUFFS BY MOUTH EVERY 6 HOURS AS NEEDED FOR WHEEZING OR  SHORTNESS  OF  BREATH 18 g 0   amLODipine (NORVASC) 5 MG tablet TAKE 1 TABLET DAILY 180 tablet 3   aspirin (ASPIRIN LOW DOSE) 81 MG chewable tablet CHEW 1 TABLET (81 MG TOTAL) BY MOUTH 2 (TWO) TIMES DAILY. TO PREVENT BLOOD CLOTS 180 tablet 1   BLACK ELDERBERRY PO Take 2 each by mouth daily.     Blood Glucose Monitoring Suppl (ONETOUCH VERIO) w/Device KIT Use to check blood sugar 2 times a day 1 kit 0   carvedilol (COREG) 25 MG tablet TAKE 1 TABLET BY MOUTH TWICE DAILY WITH A MEAL 180 tablet 3   clotrimazole (LOTRIMIN) 1 % cream Apply 1 application topically 2 (two) times daily. 30 g 0   ezetimibe (ZETIA) 10 MG tablet Take 1 tablet (10 mg total) by mouth daily. 90 tablet 3   famotidine (PEPCID) 20 MG tablet Take 1 tablet (20 mg total) by mouth daily. 90 tablet 3   fluticasone (  FLONASE) 50 MCG/ACT nasal spray Place 2 sprays into both nostrils daily as needed for allergies or rhinitis.     glucose blood (ONETOUCH VERIO) test strip Use to check blood sugar 2 times a day 200 strip 3   hydrochlorothiazide (HYDRODIURIL) 25 MG tablet TAKE 1 TABLET DAILY 90 tablet 3   HYDROmorphone (DILAUDID) 4 MG tablet Take 0.5 tablets (2 mg total) by mouth every 6 (six) hours as needed for severe pain. 20 tablet 0   isosorbide mononitrate (IMDUR) 60 MG 24 hr tablet Take 2 tablets (120 mg total) by mouth daily. 180 tablet 0   Lancet Devices (ONE TOUCH DELICA LANCING DEV) MISC Use to check blood sugar 2 times a day 1 each 0   Lancets (ONETOUCH DELICA PLUS LANCET30G) MISC USE TO CHECK BLOOD SUGAR 2 TIMES A DAY 200 each 3   latanoprost (XALATAN) 0.005 % ophthalmic solution Place 1  drop into both eyes at bedtime.      levocetirizine (XYZAL) 5 MG tablet Take 5 mg by mouth daily.     losartan (COZAAR) 100 MG tablet TAKE 1 TABLET DAILY 90 tablet 3   methocarbamol (ROBAXIN) 500 MG tablet Take 1 tablet (500 mg total) by mouth every 8 (eight) hours as needed for muscle spasms. 30 tablet 1   montelukast (SINGULAIR) 10 MG tablet Take 1 tablet (10 mg total) by mouth at bedtime. 90 tablet 3   Multiple Vitamin (MULTIVITAMIN WITH MINERALS) TABS tablet Take 1 tablet by mouth daily.     Multiple Vitamins-Minerals (EMERGEN-C IMMUNE) PACK Take 1 packet by mouth daily.     ondansetron (ZOFRAN) 4 MG tablet Take 1 tablet (4 mg total) by mouth every 8 (eight) hours as needed for nausea or vomiting. 20 tablet 0   potassium chloride SA (KLOR-CON M) 20 MEQ tablet Take 1 tablet (20 mEq total) by mouth daily. Along with potassium. 15 tablet 0   rosuvastatin (CRESTOR) 20 MG tablet Take 1 tablet (20 mg total) by mouth daily. 90 tablet 3   tirzepatide (MOUNJARO) 2.5 MG/0.5ML Pen INJECT 2.5 MG SUBCUTANEOUSLY WEEKLY 6 mL 0   pantoprazole (PROTONIX) 40 MG tablet TAKE 1 TABLET DAILY 90 tablet 3   No facility-administered medications prior to visit.     Per HPI unless specifically indicated in ROS section below Review of Systems  Constitutional:  Negative for fatigue and fever.  HENT:  Negative for congestion.   Eyes:  Negative for pain.  Respiratory:  Negative for cough and shortness of breath.   Cardiovascular:  Negative for chest pain, palpitations and leg swelling.  Gastrointestinal:  Negative for abdominal pain.  Genitourinary:  Negative for dysuria and vaginal bleeding.  Musculoskeletal:  Negative for back pain.  Neurological:  Negative for syncope, light-headedness and headaches.  Psychiatric/Behavioral:  Negative for dysphoric mood.    Objective:  BP 136/80 (BP Location: Left Arm, Patient Position: Sitting, Cuff Size: Large)   Pulse 96   Temp 98.2 F (36.8 C) (Temporal)   Ht 5' 8.5"  (1.74 m)   Wt 274 lb (124.3 kg)   SpO2 98%   BMI 41.06 kg/m   Wt Readings from Last 3 Encounters:  11/20/22 274 lb (124.3 kg)  08/05/22 282 lb (127.9 kg)  04/07/22 285 lb 4 oz (129.4 kg)      Physical Exam Vitals and nursing note reviewed.  Constitutional:      General: She is not in acute distress.    Appearance: Normal appearance. She is well-developed. She is not ill-appearing  or toxic-appearing.  HENT:     Head: Normocephalic.     Right Ear: Hearing, tympanic membrane, ear canal and external ear normal.     Left Ear: Hearing, tympanic membrane, ear canal and external ear normal.     Nose: Nose normal.  Eyes:     General: Lids are normal. Lids are everted, no foreign bodies appreciated.     Conjunctiva/sclera: Conjunctivae normal.     Pupils: Pupils are equal, round, and reactive to light.  Neck:     Thyroid: No thyroid mass or thyromegaly.     Vascular: No carotid bruit.     Trachea: Trachea normal.  Cardiovascular:     Rate and Rhythm: Normal rate and regular rhythm.     Heart sounds: Normal heart sounds, S1 normal and S2 normal. No murmur heard.    No gallop.  Pulmonary:     Effort: Pulmonary effort is normal. No respiratory distress.     Breath sounds: Normal breath sounds. No wheezing, rhonchi or rales.  Abdominal:     General: Bowel sounds are normal. There is no distension or abdominal bruit.     Palpations: Abdomen is soft. There is no fluid wave or mass.     Tenderness: There is no abdominal tenderness. There is no guarding or rebound.     Hernia: No hernia is present.  Musculoskeletal:     Cervical back: Normal range of motion and neck supple.  Lymphadenopathy:     Cervical: No cervical adenopathy.  Skin:    General: Skin is warm and dry.     Findings: No rash.  Neurological:     Mental Status: She is alert.     Cranial Nerves: No cranial nerve deficit.     Sensory: No sensory deficit.  Psychiatric:        Mood and Affect: Mood is not anxious or  depressed.        Speech: Speech normal.        Behavior: Behavior normal. Behavior is cooperative.        Judgment: Judgment normal.   Diabetic foot exam: Normal inspection No skin breakdown No calluses  Normal DP pulses Normal sensation to light touch and monofilament Nails normal     Results for orders placed or performed in visit on 11/20/22  HM DIABETES FOOT EXAM  Result Value Ref Range   HM Diabetic Foot Exam done   HM DIABETES EYE EXAM  Result Value Ref Range   HM Diabetic Eye Exam No Retinopathy No Retinopathy    This visit occurred during the SARS-CoV-2 public health emergency.  Safety protocols were in place, including screening questions prior to the visit, additional usage of staff PPE, and extensive cleaning of exam room while observing appropriate contact time as indicated for disinfecting solutions.   COVID 19 screen:  No recent travel or known exposure to COVID19 The patient denies respiratory symptoms of COVID 19 at this time. The importance of social distancing was discussed today.   Assessment and Plan   The patient's preventative maintenance and recommended screening tests for an annual wellness exam were reviewed in full today. Brought up to date unless services declined.  Counselled on the importance of diet, exercise, and its role in overall health and mortality. The patient's FH and SH was reviewed, including their home life, tobacco status, and drug and alcohol status.   Hep C screening neg 2020  Uptodate with Tdap PNA. Given flu and encouraged COVID vaccine.  Consider Shingrix  vaccine  Last pap 2015 neg PAP and HPV neg... Partial hysterectomy, no family history of ovarian cancer.  mammogram  10/01/2022,  Sister with breast cancer.Marland KitchenMarland KitchenCalculated pt at lifetime risk of 10.5% in 2024... MRI indicated for breast cancer screening > 20%.  colon: 10/2014 repeat in 10 years Dr. Rhea Belton  Problem List Items Addressed This Visit     Hyperlipidemia with target LDL  less than 70 (Chronic)    Chronic,  LDL  no longer at goal... will restart the medicaitons and wrk on compliance with low cholesterol diet.      Hypertension associated with diabetes (HCC)    Chronic, well controlled  Continue amlodipine 5 mg daily, Coreg 25 mg p.o. twice daily, hydrochlorothiazide 25 mg daily and losartan 100 mg p.o. daily      Morbid obesity (HCC)    Encouraged exercise, weight loss, healthy eating habits.       Type 2 diabetes mellitus with other circulatory complications, HTN, CVD (HCC)    Chronic,  now at goal on  Mounjaro 2.5 mg weekly.      Vaginal dryness, menopausal    Chronic  Can use vaginal lubricant like olive oil, coconut oil, KY jelly, Replens.      Other Visit Diagnoses     Routine general medical examination at a health care facility    -  Primary        Kerby Nora, MD

## 2022-11-20 NOTE — Assessment & Plan Note (Signed)
Chronic,  now at goal on  Mounjaro 2.5 mg weekly.

## 2022-11-20 NOTE — Assessment & Plan Note (Addendum)
Chronic,  LDL  no longer at goal... will restart the medicaitons and wrk on compliance with low cholesterol diet.

## 2022-11-20 NOTE — Telephone Encounter (Signed)
Faxed completed PA form to [facility] [place] at [phone] and [phone] for Monovisc, left knee. PA pending

## 2022-11-20 NOTE — Assessment & Plan Note (Signed)
Chronic, well controlled  Continue amlodipine 5 mg daily, Coreg 25 mg p.o. twice daily, hydrochlorothiazide 25 mg daily and losartan 100 mg p.o. daily

## 2022-11-23 ENCOUNTER — Other Ambulatory Visit: Payer: Self-pay | Admitting: Family Medicine

## 2022-11-23 DIAGNOSIS — R1310 Dysphagia, unspecified: Secondary | ICD-10-CM

## 2022-11-24 ENCOUNTER — Encounter: Payer: BC Managed Care – PPO | Admitting: Physical Therapy

## 2022-11-26 ENCOUNTER — Encounter: Payer: BC Managed Care – PPO | Admitting: Physical Therapy

## 2022-12-02 ENCOUNTER — Telehealth: Payer: Self-pay

## 2022-12-02 NOTE — Telephone Encounter (Signed)
Per Asatta with [employer] pre-certification patient was approved to have gel injection from 11/20/2022-11/28/2022,due to patients insurance being terminated on 11/28/2022. I did advise [name] that I never received anything about the gel injection being approved.    Patient would need to reinstate her insurance to get approved for gel injection. Talked with patient and advised her of the message above. I did offer TriVisc to patient and she stated that she will give me a call back to let me know.

## 2022-12-02 NOTE — Assessment & Plan Note (Signed)
Chronic  Can use vaginal lubricant like olive oil, coconut oil, KY jelly, Replens.

## 2022-12-06 ENCOUNTER — Encounter: Payer: Self-pay | Admitting: Family Medicine

## 2022-12-06 DIAGNOSIS — I10 Essential (primary) hypertension: Secondary | ICD-10-CM

## 2022-12-07 ENCOUNTER — Encounter: Payer: Self-pay | Admitting: Orthopedic Surgery

## 2022-12-07 NOTE — Telephone Encounter (Signed)
See message from patient. Patient has been out of two of her medications for 2 months.

## 2022-12-08 MED ORDER — CARVEDILOL 25 MG PO TABS: ORAL_TABLET | ORAL | 1 refills | Status: DC

## 2022-12-08 MED ORDER — LOSARTAN POTASSIUM 100 MG PO TABS: 100.0000 mg | ORAL_TABLET | Freq: Every day | ORAL | 1 refills | Status: DC

## 2022-12-08 MED ORDER — FAMOTIDINE 20 MG PO TABS: 20.0000 mg | ORAL_TABLET | Freq: Every day | ORAL | 1 refills | Status: DC

## 2022-12-08 MED ORDER — AMLODIPINE BESYLATE 5 MG PO TABS: 5.0000 mg | ORAL_TABLET | Freq: Every day | ORAL | 1 refills | Status: DC

## 2022-12-10 ENCOUNTER — Telehealth: Payer: Self-pay | Admitting: Orthopedic Surgery

## 2022-12-10 DIAGNOSIS — H401131 Primary open-angle glaucoma, bilateral, mild stage: Secondary | ICD-10-CM | POA: Diagnosis not present

## 2022-12-10 NOTE — Telephone Encounter (Signed)
Patient called in to follow up on this. She would like a phone call from someone regarding this. Thank you!

## 2022-12-10 NOTE — Telephone Encounter (Signed)
I called the patient.  She is having continued knee pain and swelling.  I think the meniscal tear repair is still intact but probably not as functional as we would hope.  She is not really able to work much.  We talked a lot and essentially her options would be to live with this for as long as she could versus knee replacement.  She wants to go ahead and get that knee replaced.  Eunice Blase would you mind calling her today and just this get her scheduled for left knee replacement with Biomet press-fit spinal anesthetic [id number] adductor canal block Luke assist 3 hours overnight 23-hour observation spinal anesthetic.  Eunice Blase can you fill out blue sheet and call her today.  Thanks

## 2022-12-10 NOTE — Telephone Encounter (Signed)
thx

## 2022-12-10 NOTE — Telephone Encounter (Signed)
Pt called requesting MRI pick up and summary notes. Pt states she reached out to Dr August Saucer for advice for knee and she need to know what I the next steps for care. Please call pt at [phone].

## 2022-12-10 NOTE — Telephone Encounter (Signed)
Spoke with Christine Barrett and let her know prescriptions were sent to requested pharmacy on 12/08/2022.

## 2022-12-14 ENCOUNTER — Telehealth: Payer: Self-pay | Admitting: Orthopedic Surgery

## 2022-12-14 NOTE — Telephone Encounter (Signed)
Called patient to offer available surgery dates for left total knee.  She states it is better to hold off at this time because she does not have insurance coverage and is working on financial assistance or applying for some time of aid to cover the surgery.  She will call at later date when ready to schedule.

## 2022-12-14 NOTE — Telephone Encounter (Signed)
thx

## 2023-01-07 ENCOUNTER — Encounter: Payer: Self-pay | Admitting: Orthopedic Surgery

## 2023-01-07 NOTE — Telephone Encounter (Signed)
Yes 4 weeks thx

## 2023-01-11 ENCOUNTER — Other Ambulatory Visit: Payer: Self-pay

## 2023-01-21 NOTE — Progress Notes (Signed)
Surgical Instructions    Your procedure is scheduled on Tuesday August 6,2024.  Report to [facility] Main Entrance "A" at 9:35 A.M., then check in with the Admitting office.  Call this number if you have problems the morning of surgery:  [phone]   If you have any questions prior to your surgery date call [phone]: Open Monday-Friday 8am-4pm If you experience any cold or flu symptoms such as cough, fever, chills, shortness of breath, etc. between now and your scheduled surgery, please notify us at the above number     Remember:  Do not eat after midnight the night before your surgery  You may drink clear liquids until 8:35 the morning of your surgery.   Clear liquids allowed are: Water, Non-Citrus Juices (without pulp), Carbonated Beverages, Clear Tea, Black Coffee ONLY (NO MILK, CREAM OR POWDERED CREAMER of any kind), and Gatorade.   Enhanced Recovery after Surgery for Orthopedics Enhanced Recovery after Surgery is a protocol used to improve the stress on your body and your recovery after surgery.  Patient Instructions  The day of surgery (if you have diabetes):  Drink ONE small 10 oz bottle of water by 8:35 am the morning of surgery This bottle was given to you during your hospital  pre-op appointment visit.  Nothing else to drink after completing the  Small 10 oz bottle of water.         If you have questions, please contact your surgeon's office.    Take these medicines the morning of surgery with A SIP OF WATER:  amLODipine (NORVASC)  carvedilol (COREG)  ezetimibe (ZETIA)  famotidine (PEPCID)  isosorbide mononitrate (IMDUR)  levocetirizine (XYZAL)  pantoprazole (PROTONIX)  rosuvastatin (CRESTOR)  timolol eye drops If needed:  albuterol Inhaler: Please bring with you the day of surgery. fluticasone (FLONASE)  ondansetron [facility])   Follow your surgeon's instructions on when to stop Aspirin.  If no instructions were given by your surgeon then you will need  to call the office to get those instructions.    As of today, STOP taking any Aspirin (unless otherwise instructed by your surgeon) Aleve, Naproxen, Ibuprofen, Motrin, Advil, Goody's, BC's, all herbal medications, fish oil, and all vitamins.    WHAT DO I DO ABOUT MY DIABETES MEDICATION?   Do not take oral diabetes medicines (pills) the morning of surgery.   Stop tirzepatide [facility]) 7 days prior to surgery. Last dose on ________.  The day of surgery, do not take other diabetes injectables, including Byetta (exenatide), Bydureon (exenatide ER), Victoza (liraglutide), or Trulicity (dulaglutide).  If your CBG is greater than 220 mg/dL, you may take  of your sliding scale (correction) dose of insulin.   HOW TO MANAGE YOUR DIABETES BEFORE AND AFTER SURGERY  Why is it important to control my blood sugar before and after surgery? Improving blood sugar levels before and after surgery helps healing and can limit problems. A way of improving blood sugar control is eating a healthy diet by:  Eating less sugar and carbohydrates  Increasing activity/exercise  Talking with your doctor about reaching your blood sugar goals High blood sugars (greater than 180 mg/dL) can raise your risk of infections and slow your recovery, so you will need to focus on controlling your diabetes during the weeks before surgery. Make sure that the doctor who takes care of your diabetes knows about your planned surgery including the date and location.  How do I manage my blood sugar before surgery? Check your blood sugar at least 4  times a day, starting 2 days before surgery, to make sure that the level is not too high or low.  Check your blood sugar the morning of your surgery when you wake up and every 2 hours until you get to the Short Stay unit.  If your blood sugar is less than 70 mg/dL, you will need to treat for low blood sugar: Do not take insulin. Treat a low blood sugar (less than 70 mg/dL) with  cup of  clear juice (cranberry or apple), 4 glucose tablets, OR glucose gel. Recheck blood sugar in 15 minutes after treatment (to make sure it is greater than 70 mg/dL). If your blood sugar is not greater than 70 mg/dL on recheck, call [phone] for further instructions. Report your blood sugar to the short stay nurse when you get to Short Stay.  If you are admitted to the hospital after surgery: Your blood sugar will be checked by the staff and you will probably be given insulin after surgery (instead of oral diabetes medicines) to make sure you have good blood sugar levels. The goal for blood sugar control after surgery is 80-180 mg/dL.   Special instructions:    Oral Hygiene is also important to reduce your risk of infection.  Remember - BRUSH YOUR TEETH THE MORNING OF SURGERY WITH YOUR REGULAR TOOTHPASTE   [facility]- Preparing For Surgery    Pre-operative 5 CHG Bath Instructions   You can play a key role in reducing the risk of infection after surgery. Your skin needs to be as free of germs as possible. You can reduce the number of germs on your skin by washing with CHG (chlorhexidine gluconate) soap before surgery. CHG is an antiseptic soap that kills germs and continues to kill germs even after washing.   DO NOT use if you have an allergy to chlorhexidine/CHG or antibacterial soaps. If your skin becomes reddened or irritated, stop using the CHG and notify one of our RNs at [phone].   Please shower with the CHG soap starting 4 days before surgery using the following schedule:     Please keep in mind the following:  DO NOT shave, including legs and underarms, starting the day of your first shower.   You may shave your face at any point before/day of surgery.  Place clean sheets on your bed the day you start using CHG soap. Use a clean washcloth (not used since being washed) for each shower. DO NOT sleep with pets once you start using the CHG.   CHG Shower Instructions:  If  you choose to wash your hair and private area, wash first with your normal shampoo/soap.  After you use shampoo/soap, rinse your hair and body thoroughly to remove shampoo/soap residue.  Turn the water OFF and apply about 3 tablespoons (45 ml) of CHG soap to a CLEAN washcloth.  Apply CHG soap ONLY FROM YOUR NECK DOWN TO YOUR TOES (washing for 3-5 minutes)  DO NOT use CHG soap on face, private areas, open wounds, or sores.  Pay special attention to the area where your surgery is being performed.  If you are having back surgery, having someone wash your back for you may be helpful. Wait 2 minutes after CHG soap is applied, then you may rinse off the CHG soap.  Pat dry with a clean towel  Put on clean clothes/pajamas   If you choose to wear lotion, please use ONLY the CHG-compatible lotions on the back of this paper.  Additional instructions for the day of surgery: DO NOT APPLY any lotions, deodorants, cologne, or perfumes.   Put on clean/comfortable clothes.  Brush your teeth.  Ask your nurse before applying any prescription medications to the skin.  Do not wear nail polish, gel polish, artificial nails, or any other type of covering on natural nails (fingers and toes) If you have artificial nails or gel coating that need to be removed by a nail salon, please have this removed prior to surgery. Artificial nails or gel coating may interfere with anesthesia's ability to adequately monitor your vital signs.      CHG Compatible Lotions   Aveeno Moisturizing lotion  Cetaphil Moisturizing Cream  Cetaphil Moisturizing Lotion  Clairol Herbal Essence Moisturizing Lotion, Dry Skin  Clairol Herbal Essence Moisturizing Lotion, Extra Dry Skin  Clairol Herbal Essence Moisturizing Lotion, Normal Skin  Curel Age Defying Therapeutic Moisturizing Lotion with Alpha Hydroxy  Curel Extreme Care Body Lotion  Curel Soothing Hands Moisturizing Hand Lotion  Curel Therapeutic Moisturizing Cream,  Fragrance-Free  Curel Therapeutic Moisturizing Lotion, Fragrance-Free  Curel Therapeutic Moisturizing Lotion, Original Formula  Eucerin Daily Replenishing Lotion  Eucerin Dry Skin Therapy Plus Alpha Hydroxy Crme  Eucerin Dry Skin Therapy Plus Alpha Hydroxy Lotion  Eucerin Original Crme  Eucerin Original Lotion  Eucerin Plus Crme Eucerin Plus Lotion  Eucerin TriLipid Replenishing Lotion  Keri Anti-Bacterial Hand Lotion  Keri Deep Conditioning Original Lotion Dry Skin Formula Softly Scented  Keri Deep Conditioning Original Lotion, Fragrance Free Sensitive Skin Formula  Keri Lotion Fast Absorbing Fragrance Free Sensitive Skin Formula  Keri Lotion Fast Absorbing Softly Scented Dry Skin Formula  Keri Original Lotion  Keri Skin Renewal Lotion Keri Silky Smooth Lotion  Keri Silky Smooth Sensitive Skin Lotion  Nivea Body Creamy Conditioning Oil  Nivea Body Extra Enriched Lotion  Nivea Body Original Lotion  Nivea Body Sheer Moisturizing Lotion Nivea Crme  Nivea Skin Firming Lotion  NutraDerm 30 Skin Lotion  NutraDerm Skin Lotion  NutraDerm Therapeutic Skin Cream  NutraDerm Therapeutic Skin Lotion  ProShield Protective Hand Cream  Provon moisturizing lotion  Do not bring valuables to the hospital.  [facility] is not responsible for any belongings or valuables.    Do NOT Smoke (Tobacco/Vaping)  24 hours prior to your procedure  If you use a CPAP at night, you may bring your mask for your overnight stay.   Contacts, glasses, hearing aids, dentures or partials may not be worn into surgery, please bring cases for these belongings   For patients admitted to the hospital, discharge time will be determined by your treatment team.   Patients discharged the day of surgery will not be allowed to drive home, and someone needs to stay with them for 24 hours.   SURGICAL WAITING ROOM VISITATION Patients having surgery or a procedure may have no more than 2 support people in the waiting  area - these visitors may rotate.   Children under the age of 11 must have an adult with them who is not the patient. If the patient needs to stay at the hospital during part of their recovery, the visitor guidelines for inpatient rooms apply. Pre-op nurse will coordinate an appropriate time for 1 support person to accompany patient in pre-op.  This support person may not rotate.   Please refer to [email or web address] for the visitor guidelines for Inpatients (after your surgery is over and you are in a regular room).   If you received a COVID test during your pre-op visit, it  is requested that you wear a mask when out in public, stay away from anyone that may not be feeling well, and notify your surgeon if you develop symptoms. If you have been in contact with anyone that has tested positive in the last 10 days, please notify your surgeon.    Please read over the following fact sheets that you were given.

## 2023-01-22 ENCOUNTER — Other Ambulatory Visit: Payer: Self-pay

## 2023-01-22 ENCOUNTER — Encounter (HOSPITAL_COMMUNITY)
Admission: RE | Admit: 2023-01-22 | Discharge: 2023-01-22 | Disposition: A | Discharge status: Home / Self Care | Payer: 59 | Source: Ambulatory Visit | Attending: Orthopedic Surgery | Admitting: Orthopedic Surgery

## 2023-01-22 ENCOUNTER — Encounter (HOSPITAL_COMMUNITY): Payer: Self-pay

## 2023-01-22 VITALS — BP 152/84 | HR 79 | Temp 98.3°F | Resp 18 | Ht 69.0 in | Wt 282.6 lb

## 2023-01-22 DIAGNOSIS — M79645 Pain in left finger(s): Secondary | ICD-10-CM

## 2023-01-22 DIAGNOSIS — Z01812 Encounter for preprocedural laboratory examination: Secondary | ICD-10-CM | POA: Insufficient documentation

## 2023-01-22 DIAGNOSIS — E1159 Type 2 diabetes mellitus with other circulatory complications: Secondary | ICD-10-CM

## 2023-01-22 DIAGNOSIS — Z01818 Encounter for other preprocedural examination: Secondary | ICD-10-CM

## 2023-01-22 HISTORY — DX: Unspecified convulsions: R56.9

## 2023-01-22 HISTORY — DX: Unspecified osteoarthritis, unspecified site: M19.90

## 2023-01-22 HISTORY — DX: Acute myocardial infarction, unspecified: I21.9

## 2023-01-22 LAB — CBC
HCT: 39.5 % (ref 36.0–46.0)
Hemoglobin: 12.3 g/dL (ref 12.0–15.0)
MCH: 26.2 pg (ref 26.0–34.0)
MCHC: 31.1 g/dL (ref 30.0–36.0)
MCV: 84.2 fL (ref 80.0–100.0)
Platelets: 231 10*3/uL (ref 150–400)
RBC: 4.69 MIL/uL (ref 3.87–5.11)
RDW: 15 % (ref 11.5–15.5)
WBC: 7.6 10*3/uL (ref 4.0–10.5)
nRBC: 0 % (ref 0.0–0.2)

## 2023-01-22 LAB — BASIC METABOLIC PANEL
Anion gap: 10 (ref 5–15)
BUN: 10 mg/dL (ref 8–23)
CO2: 26 mmol/L (ref 22–32)
Calcium: 9.3 mg/dL (ref 8.9–10.3)
Chloride: 107 mmol/L (ref 98–111)
Creatinine, Ser: 0.77 mg/dL (ref 0.44–1.00)
GFR, Estimated: >60 mL/min (ref >60)
Glucose, Bld: 135 mg/dL — ABNORMAL HIGH (ref 70–99)
Potassium: 3.9 mmol/L (ref 3.5–5.1)
Sodium: 143 mmol/L (ref 135–145)

## 2023-01-22 LAB — URINALYSIS, W/ REFLEX TO CULTURE (INFECTION SUSPECTED)
Bilirubin Urine: NEGATIVE
Glucose, UA: NEGATIVE mg/dL
Ketones, ur: NEGATIVE mg/dL
Leukocytes,Ua: NEGATIVE
Nitrite: NEGATIVE
Protein, ur: NEGATIVE mg/dL
Specific Gravity, Urine: 1.02 (ref 1.005–1.030)
pH: 5 (ref 5.0–8.0)

## 2023-01-22 LAB — SURGICAL PCR SCREEN
MRSA, PCR: NEGATIVE
Staphylococcus aureus: NEGATIVE

## 2023-01-22 LAB — GLUCOSE, CAPILLARY: Glucose-Capillary: 164 mg/dL — ABNORMAL HIGH (ref 70–99)

## 2023-01-22 NOTE — Progress Notes (Signed)
Surgical Instructions    Your procedure is scheduled on Tuesday August 6,2024.  Report to [facility] Main Entrance "A" at 9:35 A.M., then check in with the Admitting office.  Call this number if you have problems the morning of surgery:  [phone]   If you have any questions prior to your surgery date call [phone]: Open Monday-Friday 8am-4pm If you experience any cold or flu symptoms such as cough, fever, chills, shortness of breath, etc. between now and your scheduled surgery, please notify us at the above number     Remember:  Do not eat after midnight the night before your surgery  You may drink clear liquids until 8:35 the morning of your surgery.   Clear liquids allowed are: Water, Non-Citrus Juices (without pulp), Carbonated Beverages, Clear Tea, Black Coffee ONLY (NO MILK, CREAM OR POWDERED CREAMER of any kind), and Gatorade.   Enhanced Recovery after Surgery for Orthopedics Enhanced Recovery after Surgery is a protocol used to improve the stress on your body and your recovery after surgery.  Patient Instructions  The day of surgery (if you have diabetes):  Drink ONE small 10 oz bottle of water by 8:35 am the morning of surgery This bottle was given to you during your hospital  pre-op appointment visit.  Nothing else to drink after completing the  Small 10 oz bottle of water.         If you have questions, please contact your surgeon's office.    Take these medicines the morning of surgery with A SIP OF WATER:  amLODipine (NORVASC)  carvedilol (COREG)  ezetimibe (ZETIA)  famotidine (PEPCID)  isosorbide mononitrate (IMDUR)  levocetirizine (XYZAL)  pantoprazole (PROTONIX)  rosuvastatin (CRESTOR)  timolol eye drops If needed:  albuterol Inhaler: Please bring with you the day of surgery. fluticasone (FLONASE)  ondansetron [facility])   Follow your surgeon's instructions on when to stop Aspirin.  If no instructions were given by your surgeon then you will need  to call the office to get those instructions.    As of today, STOP taking any Aspirin (unless otherwise instructed by your surgeon) Aleve, Naproxen, Ibuprofen, Motrin, Advil, Goody's, BC's, all herbal medications, fish oil, and all vitamins.    WHAT DO I DO ABOUT MY DIABETES MEDICATION?   Do not take oral diabetes medicines (pills) the morning of surgery.   Stop tirzepatide [facility]) 7 days prior to surgery. Last dose on ________.  The day of surgery, do not take other diabetes injectables, including Byetta (exenatide), Bydureon (exenatide ER), Victoza (liraglutide), or Trulicity (dulaglutide).  If your CBG is greater than 220 mg/dL, you may take  of your sliding scale (correction) dose of insulin.   HOW TO MANAGE YOUR DIABETES BEFORE AND AFTER SURGERY  Why is it important to control my blood sugar before and after surgery? Improving blood sugar levels before and after surgery helps healing and can limit problems. A way of improving blood sugar control is eating a healthy diet by:  Eating less sugar and carbohydrates  Increasing activity/exercise  Talking with your doctor about reaching your blood sugar goals High blood sugars (greater than 180 mg/dL) can raise your risk of infections and slow your recovery, so you will need to focus on controlling your diabetes during the weeks before surgery. Make sure that the doctor who takes care of your diabetes knows about your planned surgery including the date and location.  How do I manage my blood sugar before surgery? Check your blood sugar at least 4  times a day, starting 2 days before surgery, to make sure that the level is not too high or low.  Check your blood sugar the morning of your surgery when you wake up and every 2 hours until you get to the Short Stay unit.  If your blood sugar is less than 70 mg/dL, you will need to treat for low blood sugar: Do not take insulin. Treat a low blood sugar (less than 70 mg/dL) with  cup of  clear juice (cranberry or apple), 4 glucose tablets, OR glucose gel. Recheck blood sugar in 15 minutes after treatment (to make sure it is greater than 70 mg/dL). If your blood sugar is not greater than 70 mg/dL on recheck, call [phone] for further instructions. Report your blood sugar to the short stay nurse when you get to Short Stay.  If you are admitted to the hospital after surgery: Your blood sugar will be checked by the staff and you will probably be given insulin after surgery (instead of oral diabetes medicines) to make sure you have good blood sugar levels. The goal for blood sugar control after surgery is 80-180 mg/dL.   Special instructions:    Oral Hygiene is also important to reduce your risk of infection.  Remember - BRUSH YOUR TEETH THE MORNING OF SURGERY WITH YOUR REGULAR TOOTHPASTE   [facility]- Preparing For Surgery    Pre-operative 5 CHG Bath Instructions   You can play a key role in reducing the risk of infection after surgery. Your skin needs to be as free of germs as possible. You can reduce the number of germs on your skin by washing with CHG (chlorhexidine gluconate) soap before surgery. CHG is an antiseptic soap that kills germs and continues to kill germs even after washing.   DO NOT use if you have an allergy to chlorhexidine/CHG or antibacterial soaps. If your skin becomes reddened or irritated, stop using the CHG and notify one of our RNs at [phone].   Please shower with the CHG soap starting 4 days before surgery using the following schedule:     Please keep in mind the following:  DO NOT shave, including legs and underarms, starting the day of your first shower.   You may shave your face at any point before/day of surgery.  Place clean sheets on your bed the day you start using CHG soap. Use a clean washcloth (not used since being washed) for each shower. DO NOT sleep with pets once you start using the CHG.   CHG Shower Instructions:  If  you choose to wash your hair and private area, wash first with your normal shampoo/soap.  After you use shampoo/soap, rinse your hair and body thoroughly to remove shampoo/soap residue.  Turn the water OFF and apply about 3 tablespoons (45 ml) of CHG soap to a CLEAN washcloth.  Apply CHG soap ONLY FROM YOUR NECK DOWN TO YOUR TOES (washing for 3-5 minutes)  DO NOT use CHG soap on face, private areas, open wounds, or sores.  Pay special attention to the area where your surgery is being performed.  If you are having back surgery, having someone wash your back for you may be helpful. Wait 2 minutes after CHG soap is applied, then you may rinse off the CHG soap.  Pat dry with a clean towel  Put on clean clothes/pajamas   If you choose to wear lotion, please use ONLY the CHG-compatible lotions on the back of this paper.  Additional instructions for the day of surgery: DO NOT APPLY any lotions, deodorants, cologne, or perfumes.   Put on clean/comfortable clothes.  Brush your teeth.  Ask your nurse before applying any prescription medications to the skin.  Do not wear nail polish, gel polish, artificial nails, or any other type of covering on natural nails (fingers and toes) If you have artificial nails or gel coating that need to be removed by a nail salon, please have this removed prior to surgery. Artificial nails or gel coating may interfere with anesthesia's ability to adequately monitor your vital signs.      CHG Compatible Lotions   Aveeno Moisturizing lotion  Cetaphil Moisturizing Cream  Cetaphil Moisturizing Lotion  Clairol Herbal Essence Moisturizing Lotion, Dry Skin  Clairol Herbal Essence Moisturizing Lotion, Extra Dry Skin  Clairol Herbal Essence Moisturizing Lotion, Normal Skin  Curel Age Defying Therapeutic Moisturizing Lotion with Alpha Hydroxy  Curel Extreme Care Body Lotion  Curel Soothing Hands Moisturizing Hand Lotion  Curel Therapeutic Moisturizing Cream,  Fragrance-Free  Curel Therapeutic Moisturizing Lotion, Fragrance-Free  Curel Therapeutic Moisturizing Lotion, Original Formula  Eucerin Daily Replenishing Lotion  Eucerin Dry Skin Therapy Plus Alpha Hydroxy Crme  Eucerin Dry Skin Therapy Plus Alpha Hydroxy Lotion  Eucerin Original Crme  Eucerin Original Lotion  Eucerin Plus Crme Eucerin Plus Lotion  Eucerin TriLipid Replenishing Lotion  Keri Anti-Bacterial Hand Lotion  Keri Deep Conditioning Original Lotion Dry Skin Formula Softly Scented  Keri Deep Conditioning Original Lotion, Fragrance Free Sensitive Skin Formula  Keri Lotion Fast Absorbing Fragrance Free Sensitive Skin Formula  Keri Lotion Fast Absorbing Softly Scented Dry Skin Formula  Keri Original Lotion  Keri Skin Renewal Lotion Keri Silky Smooth Lotion  Keri Silky Smooth Sensitive Skin Lotion  Nivea Body Creamy Conditioning Oil  Nivea Body Extra Enriched Lotion  Nivea Body Original Lotion  Nivea Body Sheer Moisturizing Lotion Nivea Crme  Nivea Skin Firming Lotion  NutraDerm 30 Skin Lotion  NutraDerm Skin Lotion  NutraDerm Therapeutic Skin Cream  NutraDerm Therapeutic Skin Lotion  ProShield Protective Hand Cream  Provon moisturizing lotion  Do not bring valuables to the hospital.  [facility] is not responsible for any belongings or valuables.    Do NOT Smoke (Tobacco/Vaping)  24 hours prior to your procedure  If you use a CPAP at night, you may bring your mask for your overnight stay.   Contacts, glasses, hearing aids, dentures or partials may not be worn into surgery, please bring cases for these belongings   For patients admitted to the hospital, discharge time will be determined by your treatment team.   Patients discharged the day of surgery will not be allowed to drive home, and someone needs to stay with them for 24 hours.   SURGICAL WAITING ROOM VISITATION Patients having surgery or a procedure may have no more than 2 support people in the waiting  area - these visitors may rotate.   Children under the age of 61 must have an adult with them who is not the patient. If the patient needs to stay at the hospital during part of their recovery, the visitor guidelines for inpatient rooms apply. Pre-op nurse will coordinate an appropriate time for 1 support person to accompany patient in pre-op.  This support person may not rotate.   Please refer to [email or web address] for the visitor guidelines for Inpatients (after your surgery is over and you are in a regular room).   If you received a COVID test during your pre-op visit, it  is requested that you wear a mask when out in public, stay away from anyone that may not be feeling well, and notify your surgeon if you develop symptoms. If you have been in contact with anyone that has tested positive in the last 10 days, please notify your surgeon.    Please read over the following fact sheets that you were given.

## 2023-01-22 NOTE — Progress Notes (Addendum)
PCP - Dr. Lisabeth Pick  Cardiologist - Dr. Clarisse Gouge Chirstopher  EP-no  Endocrine-no  Pulm-no  Chest x-ray - 03/06/22  EKG - 08/05/22  Stress Test - 07/01/22  ECHO - 10/02/13  Cardiac Cath -   AICD-no PM-no LOOP-no  Nerve Stimulator-no  Dialysis-no  Sleep Study - no CPAP - no  LABS-CBC, BMP, A1C- drawn 01/13/23  ASA-  ERAS-yes  HA1C-drawn 01/13/23- 6.5 GLP-1-yes mounjaro- last dose 01/18/23 Fasting Blood Sugar - 120 Checks Blood Sugar __3___ times a day  Anesthesia-  Pt denies having chest pain, sob, or fever at this time. All instructions explained to the pt, with a verbal understanding of the material. Pt agrees to go over the instructions while at home for a better understanding. The opportunity to ask questions was provided.

## 2023-01-23 ENCOUNTER — Encounter: Payer: Self-pay | Admitting: Family Medicine

## 2023-01-23 ENCOUNTER — Encounter (HOSPITAL_BASED_OUTPATIENT_CLINIC_OR_DEPARTMENT_OTHER): Payer: Self-pay

## 2023-01-23 ENCOUNTER — Encounter: Payer: Self-pay | Admitting: Orthopedic Surgery

## 2023-01-29 ENCOUNTER — Encounter: Payer: Self-pay | Admitting: Family Medicine

## 2023-01-29 MED ORDER — EZETIMIBE 10 MG PO TABS: 10.0000 mg | ORAL_TABLET | Freq: Every day | ORAL | 3 refills | Status: DC

## 2023-02-01 MED ORDER — TRANEXAMIC ACID 1000 MG/10ML IV SOLN
2000.0000 mg | INTRAVENOUS | Status: DC
  Filled 2023-02-01 (×2): qty 20

## 2023-02-01 NOTE — Anesthesia Preprocedure Evaluation (Signed)
Anesthesia Evaluation  Patient identified by MRN, date of birth, ID band Patient awake    Reviewed: Allergy & Precautions, NPO status , Patient's Chart, lab work & pertinent test results, reviewed documented beta blocker date and time   History of Anesthesia Complications (+) POST - OP SPINAL HEADACHE and history of anesthetic complications (PDPH after C/S)  Airway Mallampati: II  TM Distance: >3 FB Neck ROM: Full    Dental no notable dental hx.    Pulmonary sleep apnea    Pulmonary exam normal        Cardiovascular hypertension, Pt. on medications and Pt. on home beta blockers + CAD, + Past MI and + Cardiac Stents (2015)  Normal cardiovascular exam     Neuro/Psych  Headaches, Seizures - (childhood), Well Controlled,     GI/Hepatic Neg liver ROS, hiatal hernia,GERD  Medicated,,  Endo/Other  diabetes (on Mounjaro), Type 2    Renal/GU negative Renal ROS  negative genitourinary   Musculoskeletal  (+) Arthritis ,    Abdominal   Peds  Hematology negative hematology ROS (+)   Anesthesia Other Findings Day of surgery medications reviewed with patient.  Reproductive/Obstetrics negative OB ROS                             Anesthesia Physical Anesthesia Plan  ASA: 3  Anesthesia Plan: Spinal   Post-op Pain Management: Tylenol PO (pre-op)* and Regional block*   Induction:   PONV Risk Score and Plan: 3 and Treatment may vary due to age or medical condition, Midazolam, Dexamethasone, Ondansetron, Propofol infusion and TIVA  Airway Management Planned: Natural Airway and Simple Face Mask  Additional Equipment: None  Intra-op Plan:   Post-operative Plan:   Informed Consent: I have reviewed the patients History and Physical, chart, labs and discussed the procedure including the risks, benefits and alternatives for the proposed anesthesia with the patient or authorized representative who has  indicated his/her understanding and acceptance.       Plan Discussed with: CRNA  Anesthesia Plan Comments:        Anesthesia Quick Evaluation

## 2023-02-02 ENCOUNTER — Observation Stay (HOSPITAL_COMMUNITY): Payer: Medicaid Other | Admitting: Anesthesiology

## 2023-02-02 ENCOUNTER — Encounter (HOSPITAL_COMMUNITY)
Admission: AD | Disposition: A | Discharge status: Home Health | Payer: Self-pay | Source: Home / Self Care | Attending: Orthopedic Surgery

## 2023-02-02 ENCOUNTER — Observation Stay (HOSPITAL_BASED_OUTPATIENT_CLINIC_OR_DEPARTMENT_OTHER): Payer: Medicaid Other | Admitting: Anesthesiology

## 2023-02-02 ENCOUNTER — Inpatient Hospital Stay (HOSPITAL_COMMUNITY)
Admission: AD | Admit: 2023-02-02 | Discharge: 2023-02-05 | DRG: 470 | Disposition: A | Discharge status: Home Health | Payer: Medicaid Other | Attending: Orthopedic Surgery | Admitting: Orthopedic Surgery

## 2023-02-02 ENCOUNTER — Other Ambulatory Visit: Payer: Self-pay

## 2023-02-02 ENCOUNTER — Encounter (HOSPITAL_COMMUNITY): Payer: Self-pay | Admitting: Orthopedic Surgery

## 2023-02-02 DIAGNOSIS — Z90710 Acquired absence of both cervix and uterus: Secondary | ICD-10-CM

## 2023-02-02 DIAGNOSIS — Z880 Allergy status to penicillin: Secondary | ICD-10-CM

## 2023-02-02 DIAGNOSIS — E119 Type 2 diabetes mellitus without complications: Secondary | ICD-10-CM | POA: Diagnosis not present

## 2023-02-02 DIAGNOSIS — M1712 Unilateral primary osteoarthritis, left knee: Secondary | ICD-10-CM

## 2023-02-02 DIAGNOSIS — I2510 Atherosclerotic heart disease of native coronary artery without angina pectoris: Secondary | ICD-10-CM | POA: Diagnosis present

## 2023-02-02 DIAGNOSIS — Z803 Family history of malignant neoplasm of breast: Secondary | ICD-10-CM

## 2023-02-02 DIAGNOSIS — I10 Essential (primary) hypertension: Secondary | ICD-10-CM

## 2023-02-02 DIAGNOSIS — Z96652 Presence of left artificial knee joint: Secondary | ICD-10-CM

## 2023-02-02 DIAGNOSIS — G4733 Obstructive sleep apnea (adult) (pediatric): Secondary | ICD-10-CM | POA: Diagnosis present

## 2023-02-02 DIAGNOSIS — Z01818 Encounter for other preprocedural examination: Secondary | ICD-10-CM

## 2023-02-02 DIAGNOSIS — E1159 Type 2 diabetes mellitus with other circulatory complications: Secondary | ICD-10-CM | POA: Diagnosis present

## 2023-02-02 DIAGNOSIS — G8918 Other acute postprocedural pain: Secondary | ICD-10-CM | POA: Diagnosis not present

## 2023-02-02 DIAGNOSIS — S83207A Unspecified tear of unspecified meniscus, current injury, left knee, initial encounter: Secondary | ICD-10-CM | POA: Diagnosis present

## 2023-02-02 DIAGNOSIS — E785 Hyperlipidemia, unspecified: Secondary | ICD-10-CM | POA: Diagnosis present

## 2023-02-02 DIAGNOSIS — I252 Old myocardial infarction: Secondary | ICD-10-CM

## 2023-02-02 DIAGNOSIS — Z8249 Family history of ischemic heart disease and other diseases of the circulatory system: Secondary | ICD-10-CM

## 2023-02-02 DIAGNOSIS — E1169 Type 2 diabetes mellitus with other specified complication: Secondary | ICD-10-CM | POA: Diagnosis present

## 2023-02-02 DIAGNOSIS — R110 Nausea: Secondary | ICD-10-CM | POA: Diagnosis not present

## 2023-02-02 DIAGNOSIS — Z833 Family history of diabetes mellitus: Secondary | ICD-10-CM

## 2023-02-02 DIAGNOSIS — G8929 Other chronic pain: Secondary | ICD-10-CM | POA: Diagnosis present

## 2023-02-02 DIAGNOSIS — Z9861 Coronary angioplasty status: Secondary | ICD-10-CM

## 2023-02-02 DIAGNOSIS — Z91048 Other nonmedicinal substance allergy status: Secondary | ICD-10-CM

## 2023-02-02 DIAGNOSIS — M179 Osteoarthritis of knee, unspecified: Secondary | ICD-10-CM | POA: Diagnosis present

## 2023-02-02 DIAGNOSIS — E739 Lactose intolerance, unspecified: Secondary | ICD-10-CM | POA: Diagnosis present

## 2023-02-02 DIAGNOSIS — I152 Hypertension secondary to endocrine disorders: Secondary | ICD-10-CM | POA: Diagnosis present

## 2023-02-02 HISTORY — PX: TOTAL KNEE ARTHROPLASTY: SHX125

## 2023-02-02 LAB — GLUCOSE, CAPILLARY
Glucose-Capillary: 136 mg/dL — ABNORMAL HIGH (ref 70–99)
Glucose-Capillary: 116 mg/dL — ABNORMAL HIGH (ref 70–99)
Glucose-Capillary: 109 mg/dL — ABNORMAL HIGH (ref 70–99)
Glucose-Capillary: 91 mg/dL (ref 70–99)
Glucose-Capillary: 114 mg/dL — ABNORMAL HIGH (ref 70–99)

## 2023-02-02 SURGERY — ARTHROPLASTY, KNEE, TOTAL
Anesthesia: Spinal | Site: Knee | Laterality: Left

## 2023-02-02 MED ORDER — HYDROMORPHONE HCL 1 MG/ML IJ SOLN
INTRAMUSCULAR | Status: AC
  Filled 2023-02-02: qty 1

## 2023-02-02 MED ORDER — METHOCARBAMOL 500 MG PO TABS
500.0000 mg | ORAL_TABLET | Freq: Four times a day (QID) | ORAL | Status: DC | PRN
  Administered 2023-02-03 – 2023-02-05 (×6): 500 mg via ORAL
  Filled 2023-02-02 (×7): qty 1

## 2023-02-02 MED ORDER — VANCOMYCIN HCL 1000 MG IV SOLR
INTRAVENOUS | Status: AC
  Filled 2023-02-02: qty 20

## 2023-02-02 MED ORDER — CARVEDILOL 25 MG PO TABS
25.0000 mg | ORAL_TABLET | Freq: Two times a day (BID) | ORAL | Status: DC
  Administered 2023-02-03 – 2023-02-05 (×5): 25 mg via ORAL
  Filled 2023-02-02 (×5): qty 1

## 2023-02-02 MED ORDER — BUPIVACAINE-EPINEPHRINE (PF) 0.5% -1:200000 IJ SOLN
INTRAMUSCULAR | Status: DC | PRN
  Administered 2023-02-02: 15 mL via PERINEURAL

## 2023-02-02 MED ORDER — SODIUM CHLORIDE (PF) 0.9 % IJ SOLN
INTRAMUSCULAR | Status: DC | PRN
  Administered 2023-02-02: 60 mL

## 2023-02-02 MED ORDER — AMISULPRIDE (ANTIEMETIC) 5 MG/2ML IV SOLN: 10.0000 mg | Freq: Once | INTRAVENOUS | Status: DC | PRN

## 2023-02-02 MED ORDER — ORAL CARE MOUTH RINSE: 15.0000 mL | Freq: Once | OROMUCOSAL | Status: DC

## 2023-02-02 MED ORDER — CEFAZOLIN IN SODIUM CHLORIDE 3-0.9 GM/100ML-% IV SOLN
3.0000 g | INTRAVENOUS | Status: DC
  Filled 2023-02-02: qty 100

## 2023-02-02 MED ORDER — CEFAZOLIN IN SODIUM CHLORIDE 3-0.9 GM/100ML-% IV SOLN
3.0000 g | INTRAVENOUS | Status: AC
  Administered 2023-02-02: 3 g via INTRAVENOUS

## 2023-02-02 MED ORDER — ACETAMINOPHEN 500 MG PO TABS
1000.0000 mg | ORAL_TABLET | Freq: Once | ORAL | Status: AC
  Administered 2023-02-02: 1000 mg via ORAL

## 2023-02-02 MED ORDER — VANCOMYCIN HCL 1000 MG IV SOLR
INTRAVENOUS | Status: DC | PRN
  Administered 2023-02-02: 1000 mg

## 2023-02-02 MED ORDER — CLONIDINE HCL (ANALGESIA) 100 MCG/ML EP SOLN
EPIDURAL | Status: AC
  Filled 2023-02-02: qty 10

## 2023-02-02 MED ORDER — POVIDONE-IODINE 7.5 % EX SOLN: Freq: Once | CUTANEOUS | Status: DC

## 2023-02-02 MED ORDER — SODIUM CHLORIDE 0.9 % IR SOLN
Status: DC | PRN
  Administered 2023-02-02: 3000 mL

## 2023-02-02 MED ORDER — DOCUSATE SODIUM 100 MG PO CAPS
100.0000 mg | ORAL_CAPSULE | Freq: Two times a day (BID) | ORAL | Status: DC
  Administered 2023-02-02 – 2023-02-05 (×6): 100 mg via ORAL
  Filled 2023-02-02 (×6): qty 1

## 2023-02-02 MED ORDER — PHENOL 1.4 % MT LIQD: 1.0000 | OROMUCOSAL | Status: DC | PRN

## 2023-02-02 MED ORDER — HYDROMORPHONE HCL 1 MG/ML IJ SOLN
0.2500 mg | INTRAMUSCULAR | Status: DC | PRN
  Administered 2023-02-02: 0.5 mg via INTRAVENOUS

## 2023-02-02 MED ORDER — PROPOFOL 10 MG/ML IV BOLUS
INTRAVENOUS | Status: DC | PRN
  Administered 2023-02-02 (×2): 25 mg via INTRAVENOUS

## 2023-02-02 MED ORDER — METOCLOPRAMIDE HCL 5 MG PO TABS: 5.0000 mg | ORAL_TABLET | Freq: Three times a day (TID) | ORAL | Status: DC | PRN

## 2023-02-02 MED ORDER — MORPHINE SULFATE (PF) 10 MG/ML IJ SOLN
INTRAMUSCULAR | Status: DC | PRN
  Administered 2023-02-02: 19 mL via INTRADERMAL

## 2023-02-02 MED ORDER — HYDROCHLOROTHIAZIDE 25 MG PO TABS
25.0000 mg | ORAL_TABLET | Freq: Every day | ORAL | Status: DC
  Administered 2023-02-03 – 2023-02-05 (×3): 25 mg via ORAL
  Filled 2023-02-02 (×3): qty 1

## 2023-02-02 MED ORDER — EPHEDRINE SULFATE (PRESSORS) 50 MG/ML IJ SOLN
INTRAMUSCULAR | Status: DC | PRN
  Administered 2023-02-02 (×2): 10 mg via INTRAVENOUS

## 2023-02-02 MED ORDER — 0.9 % SODIUM CHLORIDE (POUR BTL) OPTIME
TOPICAL | Status: DC | PRN
  Administered 2023-02-02: 1000 mL
  Administered 2023-02-02: 4000 mL

## 2023-02-02 MED ORDER — BUPIVACAINE HCL (PF) 0.25 % IJ SOLN
INTRAMUSCULAR | Status: AC
  Filled 2023-02-02: qty 30

## 2023-02-02 MED ORDER — FENTANYL CITRATE (PF) 100 MCG/2ML IJ SOLN: 50.0000 ug | Freq: Once | INTRAMUSCULAR | Status: AC

## 2023-02-02 MED ORDER — CHLORHEXIDINE GLUCONATE 0.12 % MT SOLN: 15.0000 mL | Freq: Once | OROMUCOSAL | Status: AC

## 2023-02-02 MED ORDER — ACETAMINOPHEN 500 MG PO TABS
1000.0000 mg | ORAL_TABLET | Freq: Once | ORAL | Status: DC
  Filled 2023-02-02: qty 2

## 2023-02-02 MED ORDER — CHLORHEXIDINE GLUCONATE 0.12 % MT SOLN: 15.0000 mL | Freq: Once | OROMUCOSAL | Status: DC

## 2023-02-02 MED ORDER — EZETIMIBE 10 MG PO TABS
10.0000 mg | ORAL_TABLET | Freq: Every day | ORAL | Status: DC
  Administered 2023-02-03 – 2023-02-05 (×3): 10 mg via ORAL
  Filled 2023-02-02 (×3): qty 1

## 2023-02-02 MED ORDER — ACETAMINOPHEN 500 MG PO TABS
1000.0000 mg | ORAL_TABLET | Freq: Four times a day (QID) | ORAL | Status: AC
  Administered 2023-02-02 – 2023-02-03 (×4): 1000 mg via ORAL
  Filled 2023-02-02 (×3): qty 2

## 2023-02-02 MED ORDER — TRANEXAMIC ACID-NACL 1000-0.7 MG/100ML-% IV SOLN
1000.0000 mg | INTRAVENOUS | Status: DC
  Filled 2023-02-02: qty 100

## 2023-02-02 MED ORDER — FAMOTIDINE 20 MG PO TABS
20.0000 mg | ORAL_TABLET | Freq: Every day | ORAL | Status: DC
  Administered 2023-02-03 – 2023-02-05 (×3): 20 mg via ORAL
  Filled 2023-02-02 (×3): qty 1

## 2023-02-02 MED ORDER — PROPOFOL 1000 MG/100ML IV EMUL
INTRAVENOUS | Status: AC
  Filled 2023-02-02: qty 200

## 2023-02-02 MED ORDER — INSULIN ASPART 100 UNIT/ML IJ SOLN: 0.0000 [IU] | INTRAMUSCULAR | Status: DC | PRN

## 2023-02-02 MED ORDER — SODIUM CHLORIDE 0.9 % IV SOLN: INTRAVENOUS | Status: AC

## 2023-02-02 MED ORDER — METHOCARBAMOL 1000 MG/10ML IJ SOLN: 500.0000 mg | Freq: Four times a day (QID) | INTRAVENOUS | Status: DC | PRN

## 2023-02-02 MED ORDER — BUPIVACAINE LIPOSOME 1.3 % IJ SUSP
INTRAMUSCULAR | Status: AC
  Filled 2023-02-02: qty 20

## 2023-02-02 MED ORDER — ONDANSETRON HCL 4 MG PO TABS
4.0000 mg | ORAL_TABLET | Freq: Four times a day (QID) | ORAL | Status: DC | PRN
  Administered 2023-02-04: 4 mg via ORAL
  Filled 2023-02-02: qty 1

## 2023-02-02 MED ORDER — ONDANSETRON HCL 4 MG/2ML IJ SOLN
INTRAMUSCULAR | Status: AC
  Filled 2023-02-02: qty 2

## 2023-02-02 MED ORDER — LOSARTAN POTASSIUM 50 MG PO TABS
100.0000 mg | ORAL_TABLET | Freq: Every day | ORAL | Status: DC
  Administered 2023-02-03 – 2023-02-05 (×3): 100 mg via ORAL
  Filled 2023-02-02 (×3): qty 2

## 2023-02-02 MED ORDER — ISOSORBIDE MONONITRATE ER 60 MG PO TB24
120.0000 mg | ORAL_TABLET | Freq: Every day | ORAL | Status: DC
  Administered 2023-02-03 – 2023-02-05 (×3): 120 mg via ORAL
  Filled 2023-02-02 (×3): qty 2

## 2023-02-02 MED ORDER — MIDAZOLAM HCL 2 MG/2ML IJ SOLN: 2.0000 mg | Freq: Once | INTRAMUSCULAR | Status: AC

## 2023-02-02 MED ORDER — METOCLOPRAMIDE HCL 5 MG/ML IJ SOLN: 5.0000 mg | Freq: Three times a day (TID) | INTRAMUSCULAR | Status: DC | PRN

## 2023-02-02 MED ORDER — ONDANSETRON HCL 4 MG/2ML IJ SOLN
INTRAMUSCULAR | Status: DC | PRN
Start: 2023-02-02 — End: 2023-02-02
  Administered 2023-02-02: 4 mg via INTRAVENOUS

## 2023-02-02 MED ORDER — ACETAMINOPHEN 325 MG PO TABS
325.0000 mg | ORAL_TABLET | Freq: Four times a day (QID) | ORAL | Status: DC | PRN
  Administered 2023-02-03 – 2023-02-04 (×2): 650 mg via ORAL
  Filled 2023-02-02 (×2): qty 2

## 2023-02-02 MED ORDER — TRANEXAMIC ACID 1000 MG/10ML IV SOLN
2000.0000 mg | INTRAVENOUS | Status: DC
  Filled 2023-02-02: qty 20

## 2023-02-02 MED ORDER — IRRISEPT - 450ML BOTTLE WITH 0.05% CHG IN STERILE WATER, USP 99.95% OPTIME
TOPICAL | Status: DC | PRN
  Administered 2023-02-02: 450 mL via TOPICAL

## 2023-02-02 MED ORDER — TRANEXAMIC ACID-NACL 1000-0.7 MG/100ML-% IV SOLN
1000.0000 mg | INTRAVENOUS | Status: AC
  Administered 2023-02-02: 1000 mg via INTRAVENOUS

## 2023-02-02 MED ORDER — PROPOFOL 500 MG/50ML IV EMUL
INTRAVENOUS | Status: DC | PRN
  Administered 2023-02-02: 100 ug/kg/min via INTRAVENOUS

## 2023-02-02 MED ORDER — ASPIRIN 81 MG PO CHEW
81.0000 mg | CHEWABLE_TABLET | Freq: Two times a day (BID) | ORAL | Status: DC
  Administered 2023-02-02 – 2023-02-05 (×6): 81 mg via ORAL
  Filled 2023-02-02 (×6): qty 1

## 2023-02-02 MED ORDER — PANTOPRAZOLE SODIUM 40 MG PO TBEC
40.0000 mg | DELAYED_RELEASE_TABLET | Freq: Every day | ORAL | Status: DC
  Administered 2023-02-03 – 2023-02-05 (×3): 40 mg via ORAL
  Filled 2023-02-02 (×3): qty 1

## 2023-02-02 MED ORDER — TRANEXAMIC ACID 1000 MG/10ML IV SOLN
INTRAVENOUS | Status: DC | PRN
  Administered 2023-02-02: 2000 mg via TOPICAL

## 2023-02-02 MED ORDER — PHENYLEPHRINE HCL-NACL 20-0.9 MG/250ML-% IV SOLN
INTRAVENOUS | Status: DC | PRN
  Administered 2023-02-02: 30 ug/min via INTRAVENOUS

## 2023-02-02 MED ORDER — AMLODIPINE BESYLATE 5 MG PO TABS
5.0000 mg | ORAL_TABLET | Freq: Every day | ORAL | Status: DC
  Administered 2023-02-03 – 2023-02-05 (×3): 5 mg via ORAL
  Filled 2023-02-02 (×3): qty 1

## 2023-02-02 MED ORDER — LACTATED RINGERS IV SOLN: INTRAVENOUS | Status: DC

## 2023-02-02 MED ORDER — MORPHINE SULFATE (PF) 4 MG/ML IV SOLN
INTRAVENOUS | Status: AC
  Filled 2023-02-02: qty 2

## 2023-02-02 MED ORDER — POVIDONE-IODINE 10 % EX SWAB: 2.0000 | Freq: Once | CUTANEOUS | Status: DC

## 2023-02-02 MED ORDER — POVIDONE-IODINE 10 % EX SWAB
2.0000 | Freq: Once | CUTANEOUS | Status: DC
  Administered 2023-02-02: 2 via TOPICAL

## 2023-02-02 MED ORDER — CEFAZOLIN SODIUM-DEXTROSE 2-4 GM/100ML-% IV SOLN
2.0000 g | Freq: Three times a day (TID) | INTRAVENOUS | Status: AC
  Administered 2023-02-03 (×2): 2 g via INTRAVENOUS
  Filled 2023-02-02 (×2): qty 100

## 2023-02-02 MED ORDER — MENTHOL 3 MG MT LOZG: 1.0000 | LOZENGE | OROMUCOSAL | Status: DC | PRN

## 2023-02-02 MED ORDER — BUPIVACAINE IN DEXTROSE 0.75-8.25 % IT SOLN
INTRATHECAL | Status: DC | PRN
  Administered 2023-02-02: 2 mL via INTRATHECAL

## 2023-02-02 MED ORDER — CELECOXIB 100 MG PO CAPS
100.0000 mg | ORAL_CAPSULE | Freq: Two times a day (BID) | ORAL | Status: DC
  Administered 2023-02-02 – 2023-02-05 (×6): 100 mg via ORAL
  Filled 2023-02-02 (×6): qty 1

## 2023-02-02 MED ORDER — MIDAZOLAM HCL 2 MG/2ML IJ SOLN
INTRAMUSCULAR | Status: AC
  Administered 2023-02-02: 2 mg via INTRAVENOUS
  Filled 2023-02-02: qty 2

## 2023-02-02 MED ORDER — ONDANSETRON HCL 4 MG/2ML IJ SOLN
4.0000 mg | Freq: Four times a day (QID) | INTRAMUSCULAR | Status: DC | PRN
  Filled 2023-02-02: qty 2

## 2023-02-02 MED ORDER — HYDROMORPHONE HCL 1 MG/ML IJ SOLN
0.5000 mg | INTRAMUSCULAR | Status: DC | PRN
  Administered 2023-02-02 – 2023-02-04 (×8): 0.5 mg via INTRAVENOUS
  Filled 2023-02-02 (×9): qty 0.5

## 2023-02-02 MED ORDER — TRANEXAMIC ACID-NACL 1000-0.7 MG/100ML-% IV SOLN
INTRAVENOUS | Status: AC
  Filled 2023-02-02: qty 100

## 2023-02-02 MED ORDER — CHLORHEXIDINE GLUCONATE 0.12 % MT SOLN
OROMUCOSAL | Status: AC
  Administered 2023-02-02: 15 mL via OROMUCOSAL
  Filled 2023-02-02: qty 15

## 2023-02-02 MED ORDER — ORAL CARE MOUTH RINSE: 15.0000 mL | Freq: Once | OROMUCOSAL | Status: AC

## 2023-02-02 MED ORDER — FENTANYL CITRATE (PF) 100 MCG/2ML IJ SOLN
INTRAMUSCULAR | Status: AC
  Administered 2023-02-02: 50 ug via INTRAVENOUS
  Filled 2023-02-02: qty 2

## 2023-02-02 SURGICAL SUPPLY — 87 items
BAG COUNTER SPONGE SURGICOUNT (BAG) ×1 IMPLANT
BAG DECANTER FOR FLEXI CONT (MISCELLANEOUS) ×1 IMPLANT
BAG SPNG CNTER NS LX DISP (BAG) ×1
BANDAGE ESMARK 6X9 LF (GAUZE/BANDAGES/DRESSINGS) ×1 IMPLANT
BLADE SAG 18X100X1.27 (BLADE) ×1 IMPLANT
BLADE SAGITTAL (BLADE) ×1
BLADE SAW THK.89X75X18XSGTL (BLADE) ×1 IMPLANT
BNDG CMPR 5X6 CHSV STRCH STRL (GAUZE/BANDAGES/DRESSINGS) ×1
BNDG CMPR 9X6 STRL LF SNTH (GAUZE/BANDAGES/DRESSINGS) ×1
BNDG CMPR MED 15X6 ELC VLCR LF (GAUZE/BANDAGES/DRESSINGS) ×1
BNDG COHESIVE 6X5 TAN ST LF (GAUZE/BANDAGES/DRESSINGS) ×1 IMPLANT
BNDG ELASTIC 6X15 VLCR STRL LF (GAUZE/BANDAGES/DRESSINGS) ×1 IMPLANT
BNDG ESMARK 6X9 LF (GAUZE/BANDAGES/DRESSINGS) ×1
BOWL SMART MIX CTS (DISPOSABLE) IMPLANT
BSPLAT TIB 5D E CMNT STM LT (Knees) ×1 IMPLANT
CEMENT BONE SIMPLEX SPEEDSET (Cement) IMPLANT
CNTNR URN SCR LID CUP LEK RST (MISCELLANEOUS) ×1 IMPLANT
COMP FEM CMT CR STD 9 LT (Joint) ×1 IMPLANT
COMPONENT FEM CMT CR STD 9 LT (Joint) IMPLANT
CONT SPEC 4OZ STRL OR WHT (MISCELLANEOUS) ×1
COVER SURGICAL LIGHT HANDLE (MISCELLANEOUS) ×1 IMPLANT
CUFF TOURN SGL QUICK 34 (TOURNIQUET CUFF)
CUFF TOURN SGL QUICK 42 (TOURNIQUET CUFF) IMPLANT
CUFF TRNQT CYL 34X4.125X (TOURNIQUET CUFF) ×1 IMPLANT
DRAPE INCISE IOBAN 66X45 STRL (DRAPES) IMPLANT
DRAPE ORTHO SPLIT 77X108 STRL (DRAPES) ×3
DRAPE SURG ORHT 6 SPLT 77X108 (DRAPES) ×3 IMPLANT
DRAPE U-SHAPE 47X51 STRL (DRAPES) ×1 IMPLANT
DRSG AQUACEL AG ADV 3.5X14 (GAUZE/BANDAGES/DRESSINGS) IMPLANT
DURAPREP 26ML APPLICATOR (WOUND CARE) ×2 IMPLANT
ELECT CAUTERY BLADE 6.4 (BLADE) ×1 IMPLANT
ELECT REM PT RETURN 9FT ADLT (ELECTROSURGICAL) ×1
ELECTRODE REM PT RTRN 9FT ADLT (ELECTROSURGICAL) ×1 IMPLANT
GAUZE SPONGE 4X4 12PLY STRL (GAUZE/BANDAGES/DRESSINGS) ×1 IMPLANT
GLOVE BIOGEL PI IND STRL 7.0 (GLOVE) ×1 IMPLANT
GLOVE BIOGEL PI IND STRL 8 (GLOVE) ×1 IMPLANT
GLOVE ECLIPSE 7.0 STRL STRAW (GLOVE) ×1 IMPLANT
GLOVE ECLIPSE 8.0 STRL XLNG CF (GLOVE) ×1 IMPLANT
GLOVE SURG ENC MOIS LTX SZ6.5 (GLOVE) ×3 IMPLANT
GOWN STRL REUS W/ TWL LRG LVL3 (GOWN DISPOSABLE) ×3 IMPLANT
GOWN STRL REUS W/TWL LRG LVL3 (GOWN DISPOSABLE) ×3
HANDPIECE INTERPULSE COAX TIP (DISPOSABLE) ×1
HOOD PEEL AWAY T7 (MISCELLANEOUS) ×3 IMPLANT
IMMOBILIZER KNEE 20 (SOFTGOODS)
IMMOBILIZER KNEE 20 THIGH 36 (SOFTGOODS) IMPLANT
IMMOBILIZER KNEE 22 UNIV (SOFTGOODS) IMPLANT
IMMOBILIZER KNEE 24 THIGH 36 (MISCELLANEOUS) IMPLANT
IMMOBILIZER KNEE 24 UNIV (MISCELLANEOUS) ×1
KIT BASIN OR (CUSTOM PROCEDURE TRAY) ×1 IMPLANT
KIT TURNOVER KIT B (KITS) ×1 IMPLANT
LINER TIB FXD PS EF/8-11 16 LT (Liner) IMPLANT
MANIFOLD NEPTUNE II (INSTRUMENTS) ×1 IMPLANT
NDL 22X1.5 STRL (OR ONLY) (MISCELLANEOUS) ×2 IMPLANT
NDL SPNL 18GX3.5 QUINCKE PK (NEEDLE) ×1 IMPLANT
NEEDLE 22X1.5 STRL (OR ONLY) (MISCELLANEOUS) ×2 IMPLANT
NEEDLE SPNL 18GX3.5 QUINCKE PK (NEEDLE) ×1 IMPLANT
NS IRRIG 1000ML POUR BTL (IV SOLUTION) ×2 IMPLANT
PACK TOTAL JOINT (CUSTOM PROCEDURE TRAY) ×1 IMPLANT
PAD ARMBOARD 7.5X6 YLW CONV (MISCELLANEOUS) ×2 IMPLANT
PAD CAST 4YDX4 CTTN HI CHSV (CAST SUPPLIES) ×1 IMPLANT
PADDING CAST COTTON 4X4 STRL (CAST SUPPLIES) ×1
PADDING CAST COTTON 6X4 STRL (CAST SUPPLIES) ×1 IMPLANT
PIN DRILL HDLS TROCAR 75 4PK (PIN) IMPLANT
SCREW FEMALE HEX FIX 25X2.5 (ORTHOPEDIC DISPOSABLE SUPPLIES) IMPLANT
SET HNDPC FAN SPRY TIP SCT (DISPOSABLE) ×1 IMPLANT
SPIKE FLUID TRANSFER (MISCELLANEOUS) ×1 IMPLANT
STEM POLY PAT PLY 38M KNEE (Knees) IMPLANT
STEM TIB ST PERS 14+30 (Stem) IMPLANT
STEM TIBIA 5 DEG SZ E L KNEE (Knees) IMPLANT
STRIP CLOSURE SKIN 1/2X4 (GAUZE/BANDAGES/DRESSINGS) ×2 IMPLANT
SUCTION TUBE FRAZIER 10FR DISP (SUCTIONS) ×1 IMPLANT
SUT MAXBRAID (SUTURE) IMPLANT
SUT MNCRL AB 3-0 PS2 18 (SUTURE) ×1 IMPLANT
SUT VIC AB 0 CT1 27 (SUTURE) ×5
SUT VIC AB 0 CT1 27XBRD ANBCTR (SUTURE) ×3 IMPLANT
SUT VIC AB 1 CT1 36 (SUTURE) ×5 IMPLANT
SUT VIC AB 2-0 CT1 27 (SUTURE) ×4
SUT VIC AB 2-0 CT1 TAPERPNT 27 (SUTURE) ×4 IMPLANT
SUT VIC AB 2-0 CT2 27 (SUTURE) IMPLANT
SYR 30ML LL (SYRINGE) ×3 IMPLANT
SYR TB 1ML LUER SLIP (SYRINGE) ×1 IMPLANT
TIBIA STEM 5 DEG SZ E L KNEE (Knees) ×1 IMPLANT
TOWEL GREEN STERILE (TOWEL DISPOSABLE) ×2 IMPLANT
TOWEL GREEN STERILE FF (TOWEL DISPOSABLE) ×2 IMPLANT
TRAY CATH INTERMITTENT SS 16FR (CATHETERS) IMPLANT
WATER STERILE IRR 1000ML POUR (IV SOLUTION) IMPLANT
YANKAUER SUCT BULB TIP NO VENT (SUCTIONS) ×1 IMPLANT

## 2023-02-02 NOTE — H&P (Addendum)
TOTAL KNEE ADMISSION H&P  Patient is being admitted for left total knee arthroplasty.  Subjective:  Chief Complaint:left knee pain.  HPI: Christine Barrett, 62 y.o. female, has a history of pain and functional disability in the left knee due to arthritis and has failed non-surgical conservative treatments for greater than 12 weeks to includeNSAID's and/or analgesics, corticosteriod injections, viscosupplementation injections, flexibility and strengthening excercises, and activity modification.  Onset of symptoms was gradual, starting 4 years ago with rapidlly worsening course since that time. The patient noted prior procedures on the knee to include  arthroscopy and menisectomy on the left knee(s).  Patient currently rates pain in the left knee(s) at 9 out of 10 with activity. Patient has night pain, worsening of pain with activity and weight bearing, pain that interferes with activities of daily living, pain with passive range of motion, crepitus, and joint swelling.  Patient has evidence of subchondral sclerosis and joint space narrowing by imaging studies. This patient has had  prior left knee arthroscopy with lateral meniscal repair.  Patient had maceration of the anterior horn.  Patient has reported continued pain and inability to fully weight-bear for prolonged period of time since this intervention. . There is no active infection.  Patient has nickel allergy.  Plan to use nickel free implants..  Patient Active Problem List   Diagnosis Date Noted   OA (osteoarthritis) of knee 02/02/2023   Coronary artery disease involving native heart with angina pectoris, unspecified vessel or lesion type (HCC) 10/29/2021   Perineal irritation in female 08/15/2021   Dysuria 08/15/2021   Plantar fasciitis of right foot 07/04/2021   Trochanteric bursitis of left hip 04/08/2021   Pain of left middle finger 04/08/2021   Other allergic rhinitis 01/01/2021   Frequent episodes of sinusitis 01/01/2021   History of  bronchitis 01/01/2021   Allergic conjunctivitis of both eyes 01/01/2021   Family history of breast cancer in sister 09/20/2020   Type 2 diabetes mellitus with other circulatory complications, HTN, CVD (HCC) 05/17/2020   Morbid obesity (HCC) 05/17/2020   BMI 40.0-44.9, adult (HCC) 05/17/2020   Acute non-recurrent maxillary sinusitis 11/04/2016   Acute pain of both knees 08/28/2016   OSA (obstructive sleep apnea) 06/10/2016   Costochondritis 06/09/2016   Bilateral leg cramps 02/07/2016   Peripheral edema 01/29/2016   Hypertension associated with diabetes (HCC)    GERD (gastroesophageal reflux disease) 08/10/2014   Vaginal dryness, menopausal 08/10/2014   CAD S/P percutaneous coronary angioplasty - Ostial AVG Cx - Scoring balloon PTCA 10/03/2013   Hyperlipidemia with target LDL less than 70 10/02/2013   H/O non-ST elevation myocardial infarction (NSTEMI) 10/02/2013   Normocytic anemia, not due to blood loss    Allergic sinusitis 03/17/2007   MIGRAINE, COMMON W/O INTRACTABLE MIGRAINE 03/16/2007   Past Medical History:  Diagnosis Date   Allergy    SEASONAL   Anemia    Arthritis    CAD S/P percutaneous coronary angioplasty 09/2013   a) Ostial AV G Cx - 2.5 mm Angiosculpt; mid LAD 40-60%; b) Myoview 07/2014: LOW RISK, small-severe fixed inferior defect c/w infarct w/o peri-infarct ischemia.   Chronic bronchitis (HCC)    "frequently; not q yr" (10/03/2013)   Diabetes mellitus without complication (HCC)    Type 2-diet controlled.    Diverticulosis    Essential hypertension    with prior Accelerated HTN   GERD (gastroesophageal reflux disease)    Hiatal hernia    Hx of non-ST elevation myocardial infarction (NSTEMI) 10/01/2013   Due to  Accelerated HTN with existing CAD   Hyperlipemia    Migraine    "@ least once/month" (10/03/2013)   Myocardial infarction (HCC)    OSA (obstructive sleep apnea) 06/10/2016   Schatzki's ring    Seasonal allergies    Seizure (HCC)    1 in elementary  school, never knew what caused it   Sinusitis    Spinal headache    during C-section of second child.     Past Surgical History:  Procedure Laterality Date   ABDOMINAL HYSTERECTOMY  1994   "partial"   CAPSULAR RELEASE Right 06/06/2020   Procedure: RIGHT SHOULDER MANIPULATION UNDER ANESTHESIA, ROTATOR CUFF REPAIR;  Surgeon: Cammy Copa, MD;  Location: [facility] OR;  Service: Orthopedics;  Laterality: Right;   CARDIAC CATHETERIZATION N/A 08/05/2015   Procedure: Left Heart Cath and Coronary Angiography;  Surgeon: Marykay Lex, MD;  Location: [facility] INVASIVE CV LAB;  Service: Cardiovascular;  Laterality: N/A;   CESAREAN SECTION  1989   COLONOSCOPY     CORONARY ANGIOPLASTY  10/03/2013   95% ostial AV G Cx - 2.5 mm AngioSculpt Balloon PTCA; mid LAD 40-60%   LEFT HEART CATHETERIZATION WITH CORONARY ANGIOGRAM N/A 10/02/2013   Procedure: LEFT HEART CATHETERIZATION WITH CORONARY ANGIOGRAM;  Surgeon: Lennette Bihari, MD;  Location: [facility] CATH LAB;  Service: Cardiovascular;  Laterality: N/A;   NASAL SEPTOPLASTY W/ TURBINOPLASTY  ~ 2007   NM MYOVIEW LTD  08/22/2014    Low risk stress nuclear study with a small, severe, fixed defect in the distal inferior wall/apex suggestive of small prior infarct; no ischemia.  LV Wall Motion:  NL LV Function; NL Wall Motion   PERCUTANEOUS CORONARY STENT INTERVENTION (PCI-S) N/A 10/03/2013   cutting balloon angioplasty only no stent.    SINOSCOPY     TRANSTHORACIC ECHOCARDIOGRAM  10/02/2013   EF 55-60%; mild LVH, no RWMA,    TUBAL LIGATION  1989    Current Facility-Administered Medications  Medication Dose Route Frequency Provider Last Rate Last Admin   ceFAZolin (ANCEF) IVPB 3g/100 mL premix  3 g Intravenous On Call to OR Magnant, Charles L, PA-C       fentaNYL (SUBLIMAZE) 100 MCG/2ML injection            insulin aspart (novoLOG) injection 0-14 Units  0-14 Units Subcutaneous Q2H PRN Kaylyn Layer, MD       lactated ringers infusion   Intravenous Continuous  Kaylyn Layer, MD 10 mL/hr at 02/02/23 1024 New Bag at 02/02/23 1024   midazolam (VERSED) 2 MG/2ML injection            tranexamic acid (CYKLOKAPRON) 2,000 mg in sodium chloride 0.9 % 50 mL Topical Application  2,000 mg Topical To OR Magnant, Charles L, PA-C       tranexamic acid (CYKLOKAPRON) IVPB 1,000 mg  1,000 mg Intravenous To OR Magnant, Charles L, PA-C       Allergies  Allergen Reactions   Lactose Intolerance (Gi)    Nickel Rash    Nickel jewlery causes bumps and rash on skin   Penicillins Rash    Pt states she has taken since intial  reaction without recurrence.  Has patient had a PCN reaction causing immediate rash, facial/tongue/throat swelling, SOB or lightheadedness with hypotension:  no Has patient had a PCN reaction causing severe rash involving mucus membranes or skin necrosis: no Has patient had a PCN reaction that required hospitalization no Has patient had a PCN reaction occurring within the last 10 years: no  If all of the above answers are "NO", then may proceed with Cephalospori    Social History   Tobacco Use   Smoking status: Never    Passive exposure: Never   Smokeless tobacco: Never  Substance Use Topics   Alcohol use: Not Currently    Comment: occ    Family History  Adopted: Yes  Problem Relation Age of Onset   Allergic rhinitis Mother    Diabetes Mother    Sinusitis Mother    Breast cancer Sister    Hypertension Sister    Colon cancer Neg Hx    Heart attack Neg Hx    Stroke Neg Hx      Review of Systems  Musculoskeletal:  Positive for arthralgias.  All other systems reviewed and are negative.   Objective:  Physical Exam Vitals reviewed.  HENT:     Head: Normocephalic.     Nose: Nose normal.     Mouth/Throat:     Mouth: Mucous membranes are moist.  Eyes:     Pupils: Pupils are equal, round, and reactive to light.  Cardiovascular:     Rate and Rhythm: Normal rate.     Pulses: Normal pulses.  Pulmonary:     Effort: Pulmonary  effort is normal.  Abdominal:     General: Abdomen is flat.  Musculoskeletal:     Cervical back: Normal range of motion.  Skin:    General: Skin is warm.     Capillary Refill: Capillary refill takes less than 2 seconds.  Neurological:     General: No focal deficit present.     Mental Status: She is alert.  Psychiatric:        Mood and Affect: Mood normal.     Vital signs in last 24 hours: Temp:  [98.3 F (36.8 C)] 98.3 F (36.8 C) (08/06 0957) Pulse Rate:  [84] 84 (08/06 0957) Resp:  [18] 18 (08/06 0957) BP: (107)/(75) 107/75 (08/06 0957) SpO2:  [96 %] 96 % (08/06 0957) Weight:  [122.5 kg] 122.5 kg (08/06 0957)  Labs:   Estimated body mass index is 39.87 kg/m as calculated from the following:   Height as of this encounter: 5\' 9"  (1.753 m).   Weight as of this encounter: 122.5 kg.   Imaging Review Plain radiographs demonstrate moderate degenerative joint disease of the left knee(s). The overall alignment ismild valgus. The bone quality appears to be good for age and reported activity level.      Assessment/Plan:  End stage arthritis, left knee   The patient history, physical examination, clinical judgment of the provider and imaging studies are consistent with end stage degenerative joint disease of the left knee(s) and total knee arthroplasty is deemed medically necessary. The treatment options including medical management, injection therapy arthroscopy and arthroplasty were discussed at length. The risks and benefits of total knee arthroplasty were presented and reviewed. The risks due to aseptic loosening, infection, stiffness, patella tracking problems, thromboembolic complications and other imponderables were discussed. The patient acknowledged the explanation, agreed to proceed with the plan and consent was signed. Patient is being admitted for inpatient treatment for surgery, pain control, PT, OT, prophylactic antibiotics, VTE prophylaxis, progressive ambulation  and ADL's and discharge planning. The patient is planning to be discharged home with home health services     Patient's anticipated LOS is less than 2 midnights, meeting these requirements: - Younger than 11 - Lives within 1 hour of care - Has a competent adult at home to recover with  post-op recover - NO history of  - Chronic pain requiring opiods  - Diabetes  - Coronary Artery Disease  - Heart failure  - Heart attack  - Stroke  - DVT/VTE  - Cardiac arrhythmia  - Respiratory Failure/COPD  - Renal failure  - Anemia  - Advanced Liver disease

## 2023-02-02 NOTE — Transfer of Care (Signed)
Immediate Anesthesia Transfer of Care Note  Patient: Christine Barrett  Procedure(s) Performed: LEFT TOTAL KNEE ARTHROPLASTY (Left: Knee)  Patient Location: PACU  Anesthesia Type:MAC combined with regional for post-op pain  Level of Consciousness: awake and alert   Airway & Oxygen Therapy: Patient Spontanous Breathing  Post-op Assessment: Report given to RN and Post -op Vital signs reviewed and stable  Post vital signs: Reviewed and stable  Last Vitals:  Vitals Value Taken Time  BP 139/82 02/02/23 1958  Temp 98   Pulse 84 02/02/23 2000  Resp 15 02/02/23 2000  SpO2 99 % 02/02/23 2000  Vitals shown include unfiled device data.  Last Pain:  Vitals:   02/02/23 1145  TempSrc:   PainSc: 0-No pain         Complications: No notable events documented.

## 2023-02-02 NOTE — OR Nursing (Signed)
Care of patient assumed at 1096.

## 2023-02-02 NOTE — Anesthesia Procedure Notes (Signed)
Anesthesia Regional Block: Adductor canal block   Pre-Anesthetic Checklist: , timeout performed,  Correct Patient, Correct Site, Correct Laterality,  Correct Procedure, Correct Position, site marked,  Risks and benefits discussed,  Pre-op evaluation,  At surgeon's request and post-op pain management  Laterality: Left  Prep: Maximum Sterile Barrier Precautions used, chloraprep       Needles:  Injection technique: Single-shot  Needle Type: Echogenic Stimulator Needle     Needle Length: 9cm  Needle Gauge: 22     Additional Needles:   Procedures:,,,, ultrasound used (permanent image in chart),,    Narrative:  Start time: 02/02/2023 11:26 AM End time: 02/02/2023 11:29 AM Injection made incrementally with aspirations every 5 mL.  Performed by: Personally  Anesthesiologist: Kaylyn Layer, MD  Additional Notes: Risks, benefits, and alternative discussed. Patient gave consent for procedure. Patient prepped and draped in sterile fashion. Sedation administered, patient remains easily responsive to voice. Relevant anatomy identified with ultrasound guidance. Local anesthetic given in 5cc increments with no signs or symptoms of intravascular injection. No pain or paraesthesias with injection. Patient monitored throughout procedure with signs of LAST or immediate complications. Tolerated well. Ultrasound image placed in chart.  Amalia Greenhouse, MD

## 2023-02-02 NOTE — Anesthesia Postprocedure Evaluation (Signed)
Anesthesia Post Note  Patient: Christine Barrett  Procedure(s) Performed: LEFT TOTAL KNEE ARTHROPLASTY (Left: Knee)     Patient location during evaluation: PACU Anesthesia Type: Spinal Level of consciousness: sedated and patient cooperative Pain management: pain level controlled Vital Signs Assessment: post-procedure vital signs reviewed and stable Respiratory status: spontaneous breathing Cardiovascular status: stable Anesthetic complications: no   No notable events documented.  Last Vitals:  Vitals:   02/02/23 2100 02/02/23 2120  BP: (!) 155/79 (!) 156/82  Pulse: 72 70  Resp: 16   Temp: 36.7 C 36.5 C  SpO2: 100% 100%    Last Pain:  Vitals:   02/02/23 2149  TempSrc:   PainSc: 5                  Lewie Loron

## 2023-02-02 NOTE — Anesthesia Procedure Notes (Signed)
Spinal  Patient location during procedure: OR Start time: 02/02/2023 4:40 PM End time: 02/02/2023 4:45 PM Reason for block: surgical anesthesia Staffing Performed: anesthesiologist  Anesthesiologist: Leonides Grills, MD Performed by: Leonides Grills, MD Authorized by: Leonides Grills, MD   Preanesthetic Checklist Completed: patient identified, IV checked, risks and benefits discussed, surgical consent, monitors and equipment checked, pre-op evaluation and timeout performed Spinal Block Patient position: sitting Prep: DuraPrep Patient monitoring: cardiac monitor, continuous pulse ox and blood pressure Approach: midline Location: L4-5 Injection technique: single-shot Needle Needle type: Pencan  Needle gauge: 24 G Needle length: 9 cm Assessment Sensory level: T10 Events: CSF return Additional Notes Functioning IV was confirmed and monitors were applied. Sterile prep and drape, including hand hygiene and sterile gloves were used. The patient was positioned and the spine was prepped. The skin was anesthetized with lidocaine.  Free flow of clear CSF was obtained prior to injecting local anesthetic into the CSF.  The spinal needle aspirated freely following injection.  The needle was carefully withdrawn.  The patient tolerated the procedure well.

## 2023-02-02 NOTE — Brief Op Note (Signed)
   02/02/2023  8:01 PM  PATIENT:  Christine Barrett  62 y.o. female  PRE-OPERATIVE DIAGNOSIS:  left knee osteoarthritis  POST-OPERATIVE DIAGNOSIS:  left knee osteoarthritis  PROCEDURE:  Procedure(s): LEFT TOTAL KNEE ARTHROPLASTY  SURGEON:  Surgeon(s): Cammy Copa, MD  ASSISTANT: magnant pa  ANESTHESIA:   spinal  EBL: 50 ml    Total I/O In: -  Out: 200 [Urine:200]  BLOOD ADMINISTERED: none  DRAINS: none   LOCAL MEDICATIONS USED: Marcaine morphine clonidine Exparel vancomycin powder  SPECIMEN:  No Specimen  COUNTS:  YES  TOURNIQUET:   Total Tourniquet Time Documented: Thigh (Left) - 91 minutes Total: Thigh (Left) - 91 minutes   DICTATION: .Other Dictation: Dictation Number [id number]  PLAN OF CARE: Admit for overnight observation  PATIENT DISPOSITION:  PACU - hemodynamically stable

## 2023-02-02 NOTE — Op Note (Unsigned)
NAMEJEDA, SWANICK MEDICAL RECORD NO: [id number] ACCOUNT NO: [id number] DATE OF BIRTH: 1960/10/14 FACILITY: [facility] LOCATION: [facility] PHYSICIAN: Graylin Shiver. August Saucer, MD  Operative Report   PREOPERATIVE DIAGNOSIS:  Left knee arthritis.  POSTOPERATIVE DIAGNOSIS:  Left knee arthritis.  PROCEDURE:  Left total knee replacement using Persona cemented components, cruciate retaining standard nickel-free left size 9 femur with size E cemented tibia ____ with 38 mm 3 peg cemented patella and 14 mm cemented stem extension on the tibia with 16  mm medial congruent polyethylene.  SURGEON:  Graylin Shiver. August Saucer, MD  ASSISTANT:  Karenann Cai, PA.  INDICATIONS:  The patient is a 62 year old patient with refractory knee pain, who presents for operative management after explanation of risks and benefits.  DESCRIPTION OF PROCEDURE:  The patient was brought to the operating room where spinal anesthetic was induced.  Preoperative antibiotics administered.  Timeout was called.  Left leg was prescrubbed with alcohol, Betadine, allowed to air dry, prepped with  DuraPrep solution and draped in sterile manner.  Timeout was called.  Leg was elevated and exsanguinated with Esmarch wrap.  Tourniquet was inflated.  Collier Flowers was used to cover the operative field.  Anterior approach to the knee was made.  Skin and  subcutaneous tissue were sharply divided.  Median parapatellar approach was made and marked with #1 Vicryl suture.  IrriSept solution utilized after the incision as well as after the arthrotomy.  Lateral patellofemoral ligament was released.  Fat pad was  partially excised.  Minimal medial soft tissue dissection was performed in this mildly valgus knee.  We then used an osteotome to go around to the posteromedial corner.  The MCL was partially injured with this and it was repaired using nonabsorbable  MaxBraid suture supplemented with #1 Vicryl suture.  The patient did have arthritis in both the medial and lateral compartments  with the lateral compartment being worse.  Patellofemoral compartment was relatively spared.  Soft tissue removed from the  anterior and distal femur.  Knee was flexed.  Osteophytes were removed.  ACL released.  With the posterior retractor in place and retractors protecting the collateral ligaments, intramedullary alignment was used to make a cut off the tibial plateau.  The  least affected plateau appeared to be the medial plateau.  A 10 mm cut was made off of the medial side, which ended up being slightly more on the lateral side.  As a result, we only made an 8 mm cut off the distal femur using intramedullary alignment,  cut in 5 degrees of valgus.  This allowed a 12 mm spacer to fit reasonably well with only slight hyperextension.  Femur was then cut in 3 degrees of external rotation, which gave symmetric flexion and extension gaps.  Femur was a size 9.  A tibial tray  was placed aligned with the medial third of the tibial tubercle.  Next, we placed a trial femoral component, tibial component with a 14 spacer, which gave full extension and good flexion and good stability to varus and valgus stress at 0, 30 and 90  degrees. Next, the patella was cut down from 26 to approximately 15.  A 3-peg patella trial was placed.  With these trial components in positioned, the patient had a slight hyperextension with full flexion and excellent patellar stability using no thumbs  technique.  Final preparation was made on the tibia as well as the femur.  Trial components were removed.  Thorough irrigation was performed using pulsatile irrigation.  Next, we anesthetized the  capsule using a solution of Marcaine, Exparel and saline.   TXA sponge was allowed to soak along with IrriSept for 3 minutes in the knee.  At this time, we dried the bony surfaces, put a bone plug, placed vancomycin powder down the tibial canal and then cemented the tibia into position.  Femur was then cemented  into position with excess cement  removed.  Patella cemented and we allowed the cement to harden using a 14 spacer.  Trialed a 16 spacer and that gave a less hyperextension and excellent stability to varus and valgus stress at 0, 30 and 90 degrees.  Patella also tracked well.  The true spacer was placed.  Then, the tourniquet was released.  Bleeding points encountered controlled using electrocautery.  Thorough irrigation was performed.  Next, arthrotomy was closed over bolster using #1 Vicryl  suture.  Before final arthrotomy closure, we irrigated with IrriSept again and put vancomycin powder into the knee.  A final arthrotomy closure was performed and then we injected solution of Marcaine, morphine, clonidine into the knee for postoperative  pain relief.  Irrigated again with IrriSept solution and vancomycin powder, followed by interrupted inverted 0 Vicryl suture, 2-0 Vicryl suture, and 3-0 Monocryl with Steri-Strips.  Aquacel dressing applied.  The patient tolerated the procedure well  without immediate complications, transferred to the recovery room in stable condition.  Luke's assistance was required at all times for retraction, opening, closing, mobilization of tissue.  His assistance was a medical necessity.   NIK D: 02/02/2023 8:11:34 pm T: 02/02/2023 8:36:00 pm  JOB: [id number]/ [id number]

## 2023-02-02 NOTE — Progress Notes (Signed)
Orthopedic Tech Progress Note Patient Details:  Christine Barrett 11-28-1960 [id number]  CPM Left Knee CPM Left Knee: On Left Knee Flexion (Degrees): 10 Left Knee Extension (Degrees): 40  Post Interventions Patient Tolerated: Vanessa Ralphs 02/02/2023, 8:51 PM

## 2023-02-03 ENCOUNTER — Encounter (HOSPITAL_COMMUNITY): Payer: Self-pay | Admitting: Orthopedic Surgery

## 2023-02-03 DIAGNOSIS — G4733 Obstructive sleep apnea (adult) (pediatric): Secondary | ICD-10-CM | POA: Diagnosis not present

## 2023-02-03 DIAGNOSIS — Z880 Allergy status to penicillin: Secondary | ICD-10-CM | POA: Diagnosis not present

## 2023-02-03 DIAGNOSIS — R110 Nausea: Secondary | ICD-10-CM | POA: Diagnosis not present

## 2023-02-03 DIAGNOSIS — S83207A Unspecified tear of unspecified meniscus, current injury, left knee, initial encounter: Secondary | ICD-10-CM | POA: Diagnosis not present

## 2023-02-03 DIAGNOSIS — G8929 Other chronic pain: Secondary | ICD-10-CM | POA: Diagnosis not present

## 2023-02-03 DIAGNOSIS — Z803 Family history of malignant neoplasm of breast: Secondary | ICD-10-CM | POA: Diagnosis not present

## 2023-02-03 DIAGNOSIS — Z96652 Presence of left artificial knee joint: Secondary | ICD-10-CM | POA: Diagnosis not present

## 2023-02-03 DIAGNOSIS — I2510 Atherosclerotic heart disease of native coronary artery without angina pectoris: Secondary | ICD-10-CM | POA: Diagnosis not present

## 2023-02-03 DIAGNOSIS — M1712 Unilateral primary osteoarthritis, left knee: Secondary | ICD-10-CM | POA: Diagnosis not present

## 2023-02-03 DIAGNOSIS — Z91048 Other nonmedicinal substance allergy status: Secondary | ICD-10-CM | POA: Diagnosis not present

## 2023-02-03 DIAGNOSIS — Z90710 Acquired absence of both cervix and uterus: Secondary | ICD-10-CM | POA: Diagnosis not present

## 2023-02-03 DIAGNOSIS — E739 Lactose intolerance, unspecified: Secondary | ICD-10-CM | POA: Diagnosis not present

## 2023-02-03 DIAGNOSIS — Z9861 Coronary angioplasty status: Secondary | ICD-10-CM | POA: Diagnosis not present

## 2023-02-03 DIAGNOSIS — I252 Old myocardial infarction: Secondary | ICD-10-CM | POA: Diagnosis not present

## 2023-02-03 DIAGNOSIS — E1169 Type 2 diabetes mellitus with other specified complication: Secondary | ICD-10-CM | POA: Diagnosis not present

## 2023-02-03 DIAGNOSIS — Z833 Family history of diabetes mellitus: Secondary | ICD-10-CM | POA: Diagnosis not present

## 2023-02-03 DIAGNOSIS — E785 Hyperlipidemia, unspecified: Secondary | ICD-10-CM | POA: Diagnosis not present

## 2023-02-03 DIAGNOSIS — M179 Osteoarthritis of knee, unspecified: Secondary | ICD-10-CM | POA: Diagnosis not present

## 2023-02-03 DIAGNOSIS — Z8249 Family history of ischemic heart disease and other diseases of the circulatory system: Secondary | ICD-10-CM | POA: Diagnosis not present

## 2023-02-03 DIAGNOSIS — I152 Hypertension secondary to endocrine disorders: Secondary | ICD-10-CM | POA: Diagnosis not present

## 2023-02-03 DIAGNOSIS — E1159 Type 2 diabetes mellitus with other circulatory complications: Secondary | ICD-10-CM | POA: Diagnosis not present

## 2023-02-03 LAB — GLUCOSE, CAPILLARY
Glucose-Capillary: 125 mg/dL — ABNORMAL HIGH (ref 70–99)
Glucose-Capillary: 147 mg/dL — ABNORMAL HIGH (ref 70–99)
Glucose-Capillary: 162 mg/dL — ABNORMAL HIGH (ref 70–99)
Glucose-Capillary: 189 mg/dL — ABNORMAL HIGH (ref 70–99)

## 2023-02-03 MED ORDER — INSULIN ASPART 100 UNIT/ML IJ SOLN
0.0000 [IU] | Freq: Three times a day (TID) | INTRAMUSCULAR | Status: DC
  Administered 2023-02-03: 3 [IU] via SUBCUTANEOUS
  Administered 2023-02-03: 2 [IU] via SUBCUTANEOUS
  Administered 2023-02-04 – 2023-02-05 (×3): 3 [IU] via SUBCUTANEOUS
  Administered 2023-02-05: 2 [IU] via SUBCUTANEOUS

## 2023-02-03 MED ORDER — INSULIN ASPART 100 UNIT/ML IJ SOLN: 0.0000 [IU] | Freq: Every day | INTRAMUSCULAR | Status: DC

## 2023-02-03 MED ORDER — INSULIN ASPART 100 UNIT/ML IJ SOLN
4.0000 [IU] | Freq: Three times a day (TID) | INTRAMUSCULAR | Status: DC
  Administered 2023-02-03 – 2023-02-05 (×4): 4 [IU] via SUBCUTANEOUS

## 2023-02-03 NOTE — Progress Notes (Signed)
Pt can not tolerate bone foam at this time. Educated about the purpose and importance of using it however pt refused due to pain.

## 2023-02-03 NOTE — Progress Notes (Signed)
  Subjective: Christine Barrett is a 62 y.o. female s/p left TKA.  They are POD 1.  Pt's pain is moderate but controlled.  Pt denies any complain of chest pain, shortness of breath, abdominal pain, calf pain.  Patient denies any fevers or chills.  She is on the CPM machine this morning.  Has not work with physical therapy yet.  Objective: Vital signs in last 24 hours: Temp:  [97.4 F (36.3 C)-98.3 F (36.8 C)] 98 F (36.7 C) (08/07 0809) Pulse Rate:  [67-85] 85 (08/07 0809) Resp:  [9-19] 18 (08/07 0809) BP: (99-158)/(62-98) 158/80 (08/07 0809) SpO2:  [88 %-100 %] 98 % (08/07 0809) Weight:  [122.5 kg] 122.5 kg (08/06 0957)  Intake/Output from previous day: 08/06 0701 - 08/07 0700 In: -  Out: 200 [Urine:200] Intake/Output this shift: No intake/output data recorded.  Exam:  No gross blood or drainage overlying the dressing 2+ DP pulse Sensation intact distally in the operative foot Able to dorsiflex and plantarflex the operative foot No calf tenderness.  Negative Homans' sign. Not able to perform straight leg raise   Labs: No results for input(s): "HGB" in the last 72 hours. No results for input(s): "WBC", "RBC", "HCT", "PLT" in the last 72 hours. No results for input(s): "NA", "K", "CL", "CO2", "BUN", "CREATININE", "GLUCOSE", "CALCIUM" in the last 72 hours. No results for input(s): "LABPT", "INR" in the last 72 hours.  Assessment/Plan: Pt is POD: S/p TKA.    -Plan to discharge to home likely tomorrow pending patient's pain and PT eval  -WBAT with a walker.  She needs to be in knee immobilizer at all times when she is not using CPM machine or working on flexion exercises with physical therapy.  -Follow-up with Dr. August Saucer in clinic 2 weeks postoperatively    [facility] 02/03/2023, 9:21 AM

## 2023-02-03 NOTE — Progress Notes (Signed)
Physical Therapy Treatment Patient Details Name: Christine Barrett MRN: [id number] DOB: 1961/02/26 Today's Date: 02/03/2023   History of Present Illness Pt is a 62 year old female admitted on 02/02/23 for L TKA. History of pain and functional disability in the left knee due to arthritis and has failed non-surgical conservative treatments for greater than 12 weeks to includeNSAID's and/or analgesics, corticosteriod injections, viscosupplementation injections, flexibility and strengthening excercises, and activity modification.    PT Comments  Pt with fair tolerance to second treatment today. Pt continues to be limited by pain however was able to progress ambulation in hallway today. Pt declined knee immobilizer today stating that it was too uncomfortable. No change in DC/DME recs at this time. PT will continue to follow.   If plan is discharge home, recommend the following: A little help with walking and/or transfers;A little help with bathing/dressing/bathroom;Assistance with cooking/housework;Direct supervision/assist for medications management;Assist for transportation;Help with stairs or ramp for entrance   Can travel by private vehicle        Equipment Recommendations  None recommended by PT    Recommendations for Other Services       Precautions / Restrictions Precautions Precautions: Knee Precaution Booklet Issued: Yes (comment) Precaution Comments: Pt given TKA booklet and performed exercises. Required Braces or Orthoses: Knee Immobilizer - Left (Pt declined immobilizer stating that it was too uncomfortable.) Knee Immobilizer - Left: On except when in CPM Restrictions Weight Bearing Restrictions: Yes LLE Weight Bearing: Weight bearing as tolerated     Mobility  Bed Mobility Overal bed mobility: Needs Assistance Bed Mobility: Supine to Sit     Supine to sit: Contact guard     General bed mobility comments: Increased time and use of bed rails    Transfers Overall transfer  level: Needs assistance Equipment used: Rolling walker (2 wheels) Transfers: Sit to/from Stand Sit to Stand: From elevated surface, Min assist           General transfer comment: Cues for hand placement with elevated bed    Ambulation/Gait Ambulation/Gait assistance: Supervision Gait Distance (Feet): 50 Feet Assistive device: Rolling walker (2 wheels) Gait Pattern/deviations: Antalgic, Trunk flexed, Knee flexed in stance - left, Step-through pattern Gait velocity: decreased     General Gait Details: Pt with slowed step through pattern   Stairs             Wheelchair Mobility     Tilt Bed    Modified Rankin (Stroke Patients Only)       Balance Overall balance assessment: Needs assistance Sitting-balance support: Bilateral upper extremity supported, Feet supported Sitting balance-Leahy Scale: Good     Standing balance support: Bilateral upper extremity supported Standing balance-Leahy Scale: Poor Standing balance comment: Reliant on RW                            Cognition Arousal: Alert Behavior During Therapy: WFL for tasks assessed/performed Overall Cognitive Status: Within Functional Limits for tasks assessed                                          Exercises General Exercises - Lower Extremity Ankle Circles/Pumps: AROM, Both, 10 reps, Supine Quad Sets: AROM, Left, 5 reps, Supine    General Comments General comments (skin integrity, edema, etc.): VSS      Pertinent Vitals/Pain Pain Assessment Pain Assessment: 0-10 Pain Score:  10-Worst pain ever Pain Location: L knee Pain Descriptors / Indicators: Grimacing, Discomfort Pain Intervention(s): Monitored during session, Premedicated before session, Limited activity within patient's tolerance, Repositioned    Home Living                          Prior Function            PT Goals (current goals can now be found in the care plan section) Acute Rehab PT  Goals Patient Stated Goal: to go home PT Goal Formulation: With patient Time For Goal Achievement: 02/17/23 Potential to Achieve Goals: Good Progress towards PT goals: Progressing toward goals    Frequency    Min 1X/week      PT Plan      Co-evaluation              AM-PAC PT "6 Clicks" Mobility   Outcome Measure  Help needed turning from your back to your side while in a flat bed without using bedrails?: A Little Help needed moving from lying on your back to sitting on the side of a flat bed without using bedrails?: A Little Help needed moving to and from a bed to a chair (including a wheelchair)?: A Little Help needed standing up from a chair using your arms (e.g., wheelchair or bedside chair)?: A Little Help needed to walk in hospital room?: A Little Help needed climbing 3-5 steps with a railing? : A Lot 6 Click Score: 17    End of Session Equipment Utilized During Treatment: Gait belt Activity Tolerance: Patient tolerated treatment well Patient left: in chair;with call bell/phone within reach;with family/visitor present Nurse Communication: Mobility status PT Visit Diagnosis: Other abnormalities of gait and mobility (R26.89)     Time: 4098-1191 PT Time Calculation (min) (ACUTE ONLY): 30 min  Charges:    $Gait Training: 8-22 mins $Therapeutic Exercise: 8-22 mins PT General Charges $$ ACUTE PT VISIT: 1 Visit                     Christine Barrett, PT, DPT Acute Rehab Services [phone]    Christine Barrett 02/03/2023, 2:44 PM

## 2023-02-03 NOTE — TOC CM/SW Note (Addendum)
Transition of Care [facility]) - Inpatient Brief Assessment   Patient Details  Name: Christine Barrett MRN: [id number] Date of Birth: 03-05-1961  Transition of Care [facility]) CM/SW Contact:    Epifanio Lesches, RN Phone Number: 02/03/2023, 4:16 PM   Clinical Narrative:     - s/p left TKA  From home with family. Daughter to assist with care once d/c. PTA home health services arranged with [facility] by provider. Pt with DME: RW and CPM @ home. Pt without RX med concerns.Post hospital f/u noted on AVS. Pt with transportation to home.  TOC team following and will continue assisting with needs....  02/05/2023  @ 1125 HHOT added for home health services, Amy with [facility] made aware.  02/05/2023 @ 1440 pt with [facility] request . Order placed. Referral made with [employer]. Equipment delivered to pt's bedside.  Transition of Care Asessment: Insurance and Status: Insurance coverage has been reviewed Patient has primary care physician: Yes Home environment has been reviewed: PTA independent with ADL's Prior level of function:: From home with family. Prior/Current Home Services: No current home services Social Determinants of Health Reivew: SDOH reviewed no interventions necessary Readmission risk has been reviewed: No Transition of care needs: no transition of care needs at this time

## 2023-02-03 NOTE — Progress Notes (Signed)
Physical Therapy Evaluation Patient Details Name: Glendola Renderos MRN: [id number] DOB: 1961/02/16 Today's Date: 02/03/2023  History of Present Illness  Pt is a 62 year old female admitted on 02/02/23 for L TKA.  Clinical Impression  Pt presents with admitting diagnosis above. Pt today was limited by pain however was able to ambulate short distance in hallway with RW supervision. Follow physician recommendations for DC. PT will continue to follow.       If plan is discharge home, recommend the following: A little help with walking and/or transfers;A little help with bathing/dressing/bathroom;Assistance with cooking/housework;Direct supervision/assist for medications management;Assist for transportation;Help with stairs or ramp for entrance   Can travel by private vehicle        Equipment Recommendations None recommended by PT  Recommendations for Other Services       Functional Status Assessment Patient has had a recent decline in their functional status and demonstrates the ability to make significant improvements in function in a reasonable and predictable amount of time.     Precautions / Restrictions Precautions Precautions: Knee Precaution Booklet Issued: No Required Braces or Orthoses: Knee Immobilizer - Left (Per chart however no immobilizer in room.) Knee Immobilizer - Left: On except when in CPM Restrictions Weight Bearing Restrictions: Yes LLE Weight Bearing: Weight bearing as tolerated      Mobility  Bed Mobility Overal bed mobility: Needs Assistance Bed Mobility: Supine to Sit     Supine to sit: Contact guard     General bed mobility comments: Increased time and use of bed rails    Transfers Overall transfer level: Needs assistance Equipment used: Rolling walker (2 wheels) Transfers: Sit to/from Stand Sit to Stand: Contact guard assist, From elevated surface           General transfer comment: Cues for hand placement with elevated bed     Ambulation/Gait Ambulation/Gait assistance: Supervision Gait Distance (Feet): 30 Feet Assistive device: Rolling walker (2 wheels) Gait Pattern/deviations: Antalgic, Trunk flexed, Knee flexed in stance - left, Step-through pattern Gait velocity: decreased     General Gait Details: Pt with slowed step through pattern  Stairs            Wheelchair Mobility     Tilt Bed    Modified Rankin (Stroke Patients Only)       Balance Overall balance assessment: Needs assistance Sitting-balance support: Bilateral upper extremity supported, Feet supported Sitting balance-Leahy Scale: Good     Standing balance support: Bilateral upper extremity supported Standing balance-Leahy Scale: Poor Standing balance comment: Reliant on RW                             Pertinent Vitals/Pain Pain Assessment Pain Assessment: 0-10 Pain Score: 10-Worst pain ever Pain Location: L knee Pain Descriptors / Indicators: Grimacing, Discomfort Pain Intervention(s): Monitored during session, Limited activity within patient's tolerance, Premedicated before session, Repositioned    Home Living Family/patient expects to be discharged to:: Private residence Living Arrangements: Other (Comment) (Roommate) Available Help at Discharge: Family;Available 24 hours/day (Daughter will stay with pt) Type of Home: Apartment Home Access: Level entry       Home Layout: One level Home Equipment: Agricultural consultant (2 wheels);Cane - single point      Prior Function Prior Level of Function : Independent/Modified Independent;Driving;Working/employed             Mobility Comments: Mod I SPC ADLs Comments: Ind     Extremity/Trunk Assessment  Communication      Cognition Arousal: Alert Behavior During Therapy: WFL for tasks assessed/performed Overall Cognitive Status: Within Functional Limits for tasks assessed                                           General Comments General comments (skin integrity, edema, etc.): VSS    Exercises     Assessment/Plan    PT Assessment Patient needs continued PT services  PT Problem List Decreased strength;Decreased range of motion;Decreased activity tolerance;Decreased balance;Decreased coordination;Decreased mobility;Decreased cognition;Decreased knowledge of use of DME;Decreased safety awareness;Decreased knowledge of precautions;Pain;Obesity       PT Treatment Interventions DME instruction;Gait training;Stair training;Functional mobility training;Therapeutic activities;Therapeutic exercise;Balance training;Neuromuscular re-education;Cognitive remediation;Patient/family education    PT Goals (Current goals can be found in the Care Plan section)  Acute Rehab PT Goals Patient Stated Goal: to go home PT Goal Formulation: With patient Time For Goal Achievement: 02/17/23 Potential to Achieve Goals: Good    Frequency Min 1X/week     Co-evaluation               AM-PAC PT "6 Clicks" Mobility  Outcome Measure Help needed turning from your back to your side while in a flat bed without using bedrails?: A Little Help needed moving from lying on your back to sitting on the side of a flat bed without using bedrails?: A Little Help needed moving to and from a bed to a chair (including a wheelchair)?: A Little Help needed standing up from a chair using your arms (e.g., wheelchair or bedside chair)?: A Little Help needed to walk in hospital room?: A Little Help needed climbing 3-5 steps with a railing? : A Lot 6 Click Score: 17    End of Session Equipment Utilized During Treatment: Gait belt Activity Tolerance: Patient tolerated treatment well Patient left: in chair;with call bell/phone within reach;with family/visitor present Nurse Communication: Mobility status PT Visit Diagnosis: Other abnormalities of gait and mobility (R26.89)    Time: 1610-9604 PT Time Calculation (min) (ACUTE ONLY):  32 min   Charges:   PT Evaluation $PT Eval Moderate Complexity: 1 Mod PT Treatments $Gait Training: 8-22 mins PT General Charges $$ ACUTE PT VISIT: 1 Visit         Shela Nevin, PT, DPT Acute Rehab Services [phone]   [name] 02/03/2023, 11:58 AM

## 2023-02-03 NOTE — Plan of Care (Signed)

## 2023-02-04 LAB — GLUCOSE, CAPILLARY
Glucose-Capillary: 136 mg/dL — ABNORMAL HIGH (ref 70–99)
Glucose-Capillary: 196 mg/dL — ABNORMAL HIGH (ref 70–99)
Glucose-Capillary: 151 mg/dL — ABNORMAL HIGH (ref 70–99)
Glucose-Capillary: 148 mg/dL — ABNORMAL HIGH (ref 70–99)

## 2023-02-04 MED ORDER — HYDROMORPHONE HCL 2 MG PO TABS
1.0000 mg | ORAL_TABLET | ORAL | Status: DC
  Administered 2023-02-04 – 2023-02-05 (×6): 2 mg via ORAL
  Filled 2023-02-04 (×6): qty 1

## 2023-02-04 NOTE — Progress Notes (Signed)
Physical Therapy Treatment Patient Details Name: Kamaia Quiocho MRN: [id number] DOB: [birth year] Today's Date: 02/04/2023   History of Present Illness Pt is a 62 year old female admitted on 02/02/23 for L TKA. History of pain and functional disability in the left knee due to arthritis and has failed non-surgical conservative treatments for greater than 12 weeks to includeNSAID's and/or analgesics, corticosteriod injections, viscosupplementation injections, flexibility and strengthening excercises, and activity modification.    PT Comments  Pt tolerated treatment well today. Pt reported slightly less pain today and was able to progress ambulation today with RW supervision level. Pt was also able to tolerate KI today during ambulation. No change in DC/DME recs at this time. PT is likely close to being ready for DC.   If plan is discharge home, recommend the following: A little help with walking and/or transfers;A little help with bathing/dressing/bathroom;Assistance with cooking/housework;Direct supervision/assist for medications management;Assist for transportation;Help with stairs or ramp for entrance   Can travel by private vehicle        Equipment Recommendations  None recommended by PT    Recommendations for Other Services       Precautions / Restrictions Precautions Precautions: Knee Precaution Booklet Issued: No Precaution Comments: booklet given yesterday Required Braces or Orthoses: Knee Immobilizer - Left Knee Immobilizer - Left: On except when in CPM Restrictions Weight Bearing Restrictions: Yes LLE Weight Bearing: Weight bearing as tolerated     Mobility  Bed Mobility Overal bed mobility: Needs Assistance Bed Mobility: Supine to Sit     Supine to sit: Contact guard     General bed mobility comments: Increased time and use of bed rails    Transfers Overall transfer level: Needs assistance Equipment used: Rolling walker (2 wheels) Transfers: Sit to/from Stand Sit to  Stand: From elevated surface, Min assist           General transfer comment: Cues for hand placement with elevated bed    Ambulation/Gait Ambulation/Gait assistance: Supervision Gait Distance (Feet): 75 Feet Assistive device: Rolling walker (2 wheels) Gait Pattern/deviations: Antalgic, Trunk flexed, Knee flexed in stance - left, Step-through pattern Gait velocity: decreased     General Gait Details: Pt with slowed step through pattern   Stairs             Wheelchair Mobility     Tilt Bed    Modified Rankin (Stroke Patients Only)       Balance Overall balance assessment: Needs assistance Sitting-balance support: Bilateral upper extremity supported, Feet supported Sitting balance-Leahy Scale: Good     Standing balance support: Bilateral upper extremity supported Standing balance-Leahy Scale: Poor Standing balance comment: Reliant on RW                            Cognition Arousal: Alert Behavior During Therapy: WFL for tasks assessed/performed Overall Cognitive Status: Within Functional Limits for tasks assessed                                          Exercises      General Comments General comments (skin integrity, edema, etc.): VSS      Pertinent Vitals/Pain Pain Assessment Pain Assessment: 0-10 Pain Score: 8  Pain Location: L knee Pain Descriptors / Indicators: Grimacing, Discomfort Pain Intervention(s): Monitored during session, Limited activity within patient's tolerance, Premedicated before session, Repositioned    Home  Living                          Prior Function            PT Goals (current goals can now be found in the care plan section) Acute Rehab PT Goals Patient Stated Goal: to go home Progress towards PT goals: Progressing toward goals    Frequency    Min 1X/week      PT Plan      Co-evaluation              AM-PAC PT "6 Clicks" Mobility   Outcome Measure  Help  needed turning from your back to your side while in a flat bed without using bedrails?: A Little Help needed moving from lying on your back to sitting on the side of a flat bed without using bedrails?: A Little Help needed moving to and from a bed to a chair (including a wheelchair)?: A Little Help needed standing up from a chair using your arms (e.g., wheelchair or bedside chair)?: A Little Help needed to walk in hospital room?: A Little Help needed climbing 3-5 steps with a railing? : A Lot 6 Click Score: 17    End of Session Equipment Utilized During Treatment: Gait belt Activity Tolerance: Patient tolerated treatment well Patient left: in chair;with call bell/phone within reach;with family/visitor present Nurse Communication: Mobility status PT Visit Diagnosis: Other abnormalities of gait and mobility (R26.89)     Time: 3235-5732 PT Time Calculation (min) (ACUTE ONLY): 34 min  Charges:    $Gait Training: 23-37 mins PT General Charges $$ ACUTE PT VISIT: 1 Visit                     Shela Nevin, PT, DPT Acute Rehab Services [phone]    [name] 02/04/2023, 12:13 PM

## 2023-02-04 NOTE — Progress Notes (Signed)
Orthopedic Tech Progress Note Patient Details:  Christine Barrett 1960-10-17 [id number]  Pt put in CPM at 1845. Due to pt body habitus, the CPM machine cannot be placed further up her thigh to allow her knee to be exactly where the machine flexes. It is placed in the best obtainable position for the pt.   I also emphasized the importance of trying to use the bone foam as much as tolerable. Pt did state she is aware of needing to be in the knee immobilizer when not using the CPM.  CPM Left Knee CPM Left Knee: On Left Knee Flexion (Degrees): 45 Left Knee Extension (Degrees): 0 Additional Comments: pt could not tolerate 50* of flexion  Post Interventions Patient Tolerated: Fair Instructions Provided: Care of device   Carmine Savoy 02/04/2023, 7:46 PM

## 2023-02-04 NOTE — Plan of Care (Signed)
  Problem: Clinical Measurements: Goal: Ability to maintain clinical measurements within normal limits will improve Outcome: Progressing Goal: Will remain free from infection Outcome: Progressing Goal: Diagnostic test results will improve Outcome: Progressing Goal: Respiratory complications will improve Outcome: Progressing Goal: Cardiovascular complication will be avoided Outcome: Progressing   Problem: Nutrition: Goal: Adequate nutrition will be maintained Outcome: Progressing   Problem: Elimination: Goal: Will not experience complications related to bowel motility Outcome: Progressing   Problem: Pain Managment: Goal: General experience of comfort will improve Outcome: Progressing

## 2023-02-04 NOTE — Progress Notes (Signed)
Arrived to place PIV per IV team consult for pain meds.  Patient states that she has now been started on by mouth pain meds and does not need IV.  Confirmed with Tammy P, RN that patient does not need IV at this time if she does not want IV pain meds.  Patient states she would like to see how the by mouth meds work first.  Patient and Tammy aware that IV team consult to be re entered if IV meds become necessary.

## 2023-02-04 NOTE — Progress Notes (Signed)
Physical Therapy Treatment Patient Details Name: Christine Barrett MRN: [id number] DOB: [birth year] Today's Date: 02/04/2023   History of Present Illness Pt is a 62 year old female admitted on 02/02/23 for L TKA. History of pain and functional disability in the left knee due to arthritis and has failed non-surgical conservative treatments for greater than 12 weeks to includeNSAID's and/or analgesics, corticosteriod injections, viscosupplementation injections, flexibility and strengthening excercises, and activity modification.    PT Comments  Second session limited today by pain and nausea. Pt educated on importance of having food on stomach when taking pain meds. Pt refused OOB mobility however was agreeable to bed exercises. No change in DC/DME recs at this time.  PT will continue to follow.   If plan is discharge home, recommend the following: A little help with walking and/or transfers;A little help with bathing/dressing/bathroom;Assistance with cooking/housework;Direct supervision/assist for medications management;Assist for transportation;Help with stairs or ramp for entrance   Can travel by private vehicle        Equipment Recommendations  None recommended by PT    Recommendations for Other Services       Precautions / Restrictions Precautions Precautions: Knee Precaution Booklet Issued: No Precaution Comments: booklet given yesterday Required Braces or Orthoses: Knee Immobilizer - Left Knee Immobilizer - Left: On except when in CPM Restrictions Weight Bearing Restrictions: Yes LLE Weight Bearing: Weight bearing as tolerated     Mobility  Bed Mobility Overal bed mobility: Needs Assistance Bed Mobility: Supine to Sit     Supine to sit: Contact guard     General bed mobility comments: Pt declined mobility due to pain and nausea    Transfers Overall transfer level: Needs assistance Equipment used: Rolling walker (2 wheels) Transfers: Sit to/from Stand Sit to Stand: From  elevated surface, Min assist           General transfer comment: Cues for hand placement with elevated bed    Ambulation/Gait Ambulation/Gait assistance: Supervision Gait Distance (Feet): 75 Feet Assistive device: Rolling walker (2 wheels) Gait Pattern/deviations: Antalgic, Trunk flexed, Knee flexed in stance - left, Step-through pattern Gait velocity: decreased     General Gait Details: Pt with slowed step through pattern   Stairs             Wheelchair Mobility     Tilt Bed    Modified Rankin (Stroke Patients Only)       Balance Overall balance assessment: Needs assistance Sitting-balance support: Bilateral upper extremity supported, Feet supported Sitting balance-Leahy Scale: Good     Standing balance support: Bilateral upper extremity supported Standing balance-Leahy Scale: Poor Standing balance comment: Reliant on RW                            Cognition Arousal: Alert Behavior During Therapy: WFL for tasks assessed/performed Overall Cognitive Status: Within Functional Limits for tasks assessed                                          Exercises General Exercises - Lower Extremity Ankle Circles/Pumps: AROM, Both, 10 reps, Supine Quad Sets: AROM, Left, Supine, 10 reps Gluteal Sets: AROM, Both, 10 reps, Supine    General Comments General comments (skin integrity, edema, etc.): VSS      Pertinent Vitals/Pain Pain Assessment Pain Assessment: 0-10 Pain Score: 10-Worst pain ever Pain Location: L knee Pain Descriptors /  Indicators: Grimacing, Discomfort Pain Intervention(s): Monitored during session, Limited activity within patient's tolerance, Premedicated before session    Home Living                          Prior Function            PT Goals (current goals can now be found in the care plan section) Acute Rehab PT Goals Patient Stated Goal: to go home Progress towards PT goals: Progressing toward  goals    Frequency    Min 1X/week      PT Plan      Co-evaluation              AM-PAC PT "6 Clicks" Mobility   Outcome Measure  Help needed turning from your back to your side while in a flat bed without using bedrails?: A Little Help needed moving from lying on your back to sitting on the side of a flat bed without using bedrails?: A Little Help needed moving to and from a bed to a chair (including a wheelchair)?: A Little Help needed standing up from a chair using your arms (e.g., wheelchair or bedside chair)?: A Little Help needed to walk in hospital room?: A Little Help needed climbing 3-5 steps with a railing? : A Lot 6 Click Score: 17    End of Session Equipment Utilized During Treatment: Gait belt Activity Tolerance: Patient limited by pain;Other (comment) (Limited by nausea) Patient left: in bed;with call bell/phone within reach;with family/visitor present Nurse Communication: Mobility status PT Visit Diagnosis: Other abnormalities of gait and mobility (R26.89)     Time: 1914-7829 PT Time Calculation (min) (ACUTE ONLY): 9 min  Charges:    $Gait Training: 23-37 mins $Therapeutic Exercise: 8-22 mins PT General Charges $$ ACUTE PT VISIT: 1 Visit                     Christine Barrett, PT, DPT Acute Rehab Services [phone]    Christine Barrett 02/04/2023, 3:44 PM

## 2023-02-04 NOTE — Progress Notes (Signed)
  Subjective: Christine Barrett is a 62 y.o. female s/p left TKA.  They are POD2.  Pt's pain is controlled.  Pt denies any complain of chest pain, shortness of breath, abdominal pain, calf pain.  Patient denies any fevers or chills. Complaining of nausea today.  Able to ambulate with PT about 75 feet.    Objective: Vital signs in last 24 hours: Temp:  [99.2 F (37.3 C)-99.8 F (37.7 C)] 99.2 F (37.3 C) (08/08 0430) Pulse Rate:  [81-89] 84 (08/08 0430) Resp:  [18] 18 (08/08 0430) BP: (116-143)/(61-77) 116/61 (08/08 0430) SpO2:  [95 %-98 %] 97 % (08/08 0430)  Intake/Output from previous day: 08/07 0701 - 08/08 0700 In: 240 [P.O.:240] Out: 1300 [Urine:1300] Intake/Output this shift: Total I/O In: 240 [P.O.:240] Out: -   Exam:  No gross blood or drainage overlying the dressing 2+ DP pulse Sensation intact distally in the operative foot Able to dorsiflex and plantarflex the operative foot No calf tenderness.  Negative Homans' sign. Not able to perform straight leg raise   Labs: No results for input(s): "HGB" in the last 72 hours. No results for input(s): "WBC", "RBC", "HCT", "PLT" in the last 72 hours. No results for input(s): "NA", "K", "CL", "CO2", "BUN", "CREATININE", "GLUCOSE", "CALCIUM" in the last 72 hours. No results for input(s): "LABPT", "INR" in the last 72 hours.  Assessment/Plan: Pt is POD2 s/p TKA.    -Plan to discharge to home likely tomorrow pending patient's pain and PT eval. Doesn't feel ready for discharge yet  -WBAT with a walker  -Follow-up with Dr. August Saucer in clinic 2 weeks postoperatively    [facility] 02/04/2023, 12:26 PM

## 2023-02-04 NOTE — Plan of Care (Signed)

## 2023-02-05 DIAGNOSIS — Z96652 Presence of left artificial knee joint: Secondary | ICD-10-CM | POA: Diagnosis not present

## 2023-02-05 DIAGNOSIS — M1712 Unilateral primary osteoarthritis, left knee: Secondary | ICD-10-CM | POA: Diagnosis not present

## 2023-02-05 LAB — GLUCOSE, CAPILLARY
Glucose-Capillary: 135 mg/dL — ABNORMAL HIGH (ref 70–99)
Glucose-Capillary: 156 mg/dL — ABNORMAL HIGH (ref 70–99)

## 2023-02-05 MED ORDER — ONDANSETRON HCL 4 MG PO TABS: 4.0000 mg | ORAL_TABLET | Freq: Four times a day (QID) | ORAL | 0 refills | Status: DC | PRN

## 2023-02-05 MED ORDER — METHOCARBAMOL 500 MG PO TABS: 500.0000 mg | ORAL_TABLET | Freq: Three times a day (TID) | ORAL | 1 refills | Status: DC | PRN

## 2023-02-05 MED ORDER — CELECOXIB 100 MG PO CAPS: 100.0000 mg | ORAL_CAPSULE | Freq: Two times a day (BID) | ORAL | 0 refills | Status: DC

## 2023-02-05 MED ORDER — DOCUSATE SODIUM 100 MG PO CAPS: 100.0000 mg | ORAL_CAPSULE | Freq: Two times a day (BID) | ORAL | 0 refills | Status: DC

## 2023-02-05 MED ORDER — ASPIRIN 81 MG PO CHEW: 81.0000 mg | CHEWABLE_TABLET | Freq: Two times a day (BID) | ORAL | 0 refills | Status: DC

## 2023-02-05 MED ORDER — ACETAMINOPHEN 325 MG PO TABS: 325.0000 mg | ORAL_TABLET | Freq: Four times a day (QID) | ORAL | 0 refills | Status: DC | PRN

## 2023-02-05 MED ORDER — HYDROMORPHONE HCL 2 MG PO TABS: 2.0000 mg | ORAL_TABLET | Freq: Four times a day (QID) | ORAL | 0 refills | Status: DC | PRN

## 2023-02-05 NOTE — Progress Notes (Signed)
  Subjective: Patient stable.  Pain controlled with oral Dilaudid but she did have some nausea yesterday.  Was able to walk out in the hallway but has not done stairs yet.  Patient does not have stairs at home.   Objective: Vital signs in last 24 hours: Temp:  [98.9 F (37.2 C)-99.5 F (37.5 C)] 98.9 F (37.2 C) (08/08 2033) Pulse Rate:  [84-97] 84 (08/09 0511) Resp:  [16-18] 16 (08/09 0511) BP: (112-114)/(60-63) 112/60 (08/09 0511) SpO2:  [94 %-98 %] 95 % (08/09 0511)  Intake/Output from previous day: 08/08 0701 - 08/09 0700 In: 360 [P.O.:360] Out: -  Intake/Output this shift: No intake/output data recorded.  Exam:  Sensation intact distally Intact pulses distally Dorsiflexion/Plantar flexion intact  Labs: No results for input(s): "HGB" in the last 72 hours. No results for input(s): "WBC", "RBC", "HCT", "PLT" in the last 72 hours. No results for input(s): "NA", "K", "CL", "CO2", "BUN", "CREATININE", "GLUCOSE", "CALCIUM" in the last 72 hours. No results for input(s): "LABPT", "INR" in the last 72 hours.  Assessment/Plan: Plan at this time is physical therapy today.  Anticipate possible discharge this afternoon after 2 sessions of physical therapy.  If not today then we will plan for discharge tomorrow.  Will ask social work to evaluate patient's suitability for home health aide as well.  She will need home health physical therapy for the first 2 weeks also.   Marrianne Mood  02/05/2023, 7:35 AM

## 2023-02-05 NOTE — Progress Notes (Signed)
Physical Therapy Treatment Patient Details Name: Christine Barrett MRN: [id number] DOB: [birth year] Today's Date: 02/05/2023   History of Present Illness The pt is a 62 yo female presenting 8/6 for L TKA due to arthritis and pain in L knee. PMH includes: CAD, bursitis of L hip, DM II, HTN, obesity, OSA, and HLD.    PT Comments  The pt was agreeable to session, reports better pain control this morning. Pt able to complete bed mobility without physical assist, but did need minA to steady RW with sit-stand transfer this morning. Pt then completing great distance hallway ambulation with supervision and use of RW, cues for improved gait mechanics. The pt does not have stairs to enter her home and is able to walk the distances needed to return to her home. She will continue to benefit from skilled PT to progress functional strength and ROM in her R knee as well as improved stability and reduced dependence on DME for gait. She is safe to return home with available support when medically stable.    If plan is discharge home, recommend the following: A little help with walking and/or transfers;A little help with bathing/dressing/bathroom;Assistance with cooking/housework;Direct supervision/assist for medications management;Assist for transportation;Help with stairs or ramp for entrance   Can travel by private vehicle        Equipment Recommendations  None recommended by PT    Recommendations for Other Services       Precautions / Restrictions Precautions Precautions: Knee Precaution Booklet Issued: No Precaution Comments: pt with bend in knee/bed upon arrival, reiterated importance of knee flat at rest Required Braces or Orthoses: Knee Immobilizer - Left Knee Immobilizer - Left: On except when in CPM Restrictions Weight Bearing Restrictions: Yes LLE Weight Bearing: Weight bearing as tolerated     Mobility  Bed Mobility Overal bed mobility: Needs Assistance Bed Mobility: Supine to Sit     Supine  to sit: Contact guard     General bed mobility comments: Increased time and use of bed rails    Transfers Overall transfer level: Needs assistance Equipment used: Rolling walker (2 wheels) Transfers: Sit to/from Stand Sit to Stand: Min assist           General transfer comment: minA to steady RW as pt pulling on RW with L hand, cues for hands on bed.    Ambulation/Gait Ambulation/Gait assistance: Supervision Gait Distance (Feet): 150 Feet Assistive device: Rolling walker (2 wheels) Gait Pattern/deviations: Antalgic, Trunk flexed, Knee flexed in stance - left, Step-through pattern Gait velocity: decreased Gait velocity interpretation: <1.31 ft/sec, indicative of household ambulator   General Gait Details: Pt with slowed step through pattern      Balance Overall balance assessment: Needs assistance Sitting-balance support: Bilateral upper extremity supported, Feet supported Sitting balance-Leahy Scale: Good     Standing balance support: Bilateral upper extremity supported Standing balance-Leahy Scale: Poor Standing balance comment: Reliant on RW                            Cognition Arousal: Alert Behavior During Therapy: WFL for tasks assessed/performed Overall Cognitive Status: Within Functional Limits for tasks assessed                                          Exercises      General Comments General comments (skin integrity, edema, etc.): VSS on  RA      Pertinent Vitals/Pain Pain Assessment Pain Assessment: 0-10 Pain Score: 6  Pain Location: L knee Pain Descriptors / Indicators: Grimacing, Discomfort Pain Intervention(s): Limited activity within patient's tolerance, Monitored during session, Repositioned     PT Goals (current goals can now be found in the care plan section) Acute Rehab PT Goals Patient Stated Goal: to go home PT Goal Formulation: With patient Time For Goal Achievement: 02/17/23 Potential to Achieve  Goals: Good Progress towards PT goals: Progressing toward goals    Frequency    Min 1X/week       AM-PAC PT "6 Clicks" Mobility   Outcome Measure  Help needed turning from your back to your side while in a flat bed without using bedrails?: A Little Help needed moving from lying on your back to sitting on the side of a flat bed without using bedrails?: A Little Help needed moving to and from a bed to a chair (including a wheelchair)?: A Little Help needed standing up from a chair using your arms (e.g., wheelchair or bedside chair)?: A Little Help needed to walk in hospital room?: A Little Help needed climbing 3-5 steps with a railing? : A Lot 6 Click Score: 17    End of Session Equipment Utilized During Treatment: Gait belt Activity Tolerance: Patient tolerated treatment well Patient left: in chair;with call bell/phone within reach;with family/visitor present Nurse Communication: Mobility status PT Visit Diagnosis: Other abnormalities of gait and mobility (R26.89)     Time: 9147-8295 PT Time Calculation (min) (ACUTE ONLY): 28 min  Charges:    $Gait Training: 8-22 mins $Therapeutic Activity: 8-22 mins PT General Charges $$ ACUTE PT VISIT: 1 Visit                     Vickki Muff, PT, DPT   Acute Rehabilitation Department Office [phone] Secure Chat Communication Preferred   Ronnie Derby 02/05/2023, 12:47 PM

## 2023-02-05 NOTE — Progress Notes (Signed)
Coy Saunas to be D/C'd  per MD order.  Discussed with the patient and all questions fully answered.  VSS, Skin clean, dry and intact without evidence of skin break down, no evidence of skin tears noted.  IV catheter discontinued intact. Site without signs and symptoms of complications. Dressing and pressure applied.  An After Visit Summary was printed and given to the patient. Patient received prescription.  D/c education completed with patient/family including follow up instructions, medication list, d/c activities limitations if indicated, with other d/c instructions as indicated by MD - patient able to verbalize understanding, all questions fully answered.   Patient instructed to return to ED, call 911, or call MD for any changes in condition.   Patient to be escorted via WC, and D/C home via private auto.

## 2023-02-05 NOTE — Plan of Care (Signed)
  Problem: Education: Goal: Knowledge of General Education information will improve Description: Including pain rating scale, medication(s)/side effects and non-pharmacologic comfort measures Outcome: Adequate for Discharge   Problem: Health Behavior/Discharge Planning: Goal: Ability to manage health-related needs will improve Outcome: Adequate for Discharge   Problem: Clinical Measurements: Goal: Ability to maintain clinical measurements within normal limits will improve Outcome: Adequate for Discharge Goal: Will remain free from infection Outcome: Adequate for Discharge Goal: Diagnostic test results will improve Outcome: Adequate for Discharge Goal: Respiratory complications will improve Outcome: Adequate for Discharge Goal: Cardiovascular complication will be avoided Outcome: Adequate for Discharge   Problem: Activity: Goal: Risk for activity intolerance will decrease Outcome: Adequate for Discharge   Problem: Nutrition: Goal: Adequate nutrition will be maintained Outcome: Adequate for Discharge   Problem: Coping: Goal: Level of anxiety will decrease Outcome: Adequate for Discharge   Problem: Elimination: Goal: Will not experience complications related to bowel motility Outcome: Adequate for Discharge Goal: Will not experience complications related to urinary retention Outcome: Adequate for Discharge   Problem: Pain Managment: Goal: General experience of comfort will improve Outcome: Adequate for Discharge   Problem: Safety: Goal: Ability to remain free from injury will improve Outcome: Adequate for Discharge   Problem: Skin Integrity: Goal: Risk for impaired skin integrity will decrease Outcome: Adequate for Discharge   Problem: Education: Goal: Knowledge of the prescribed therapeutic regimen will improve Outcome: Adequate for Discharge Goal: Individualized Educational Video(s) Outcome: Adequate for Discharge   Problem: Activity: Goal: Ability to avoid  complications of mobility impairment will improve Outcome: Adequate for Discharge Goal: Range of joint motion will improve Outcome: Adequate for Discharge   Problem: Clinical Measurements: Goal: Postoperative complications will be avoided or minimized Outcome: Adequate for Discharge   Problem: Pain Management: Goal: Pain level will decrease with appropriate interventions Outcome: Adequate for Discharge   Problem: Skin Integrity: Goal: Will show signs of wound healing Outcome: Adequate for Discharge   Problem: Education: Goal: Ability to describe self-care measures that may prevent or decrease complications (Diabetes Survival Skills Education) will improve Outcome: Adequate for Discharge Goal: Individualized Educational Video(s) Outcome: Adequate for Discharge   Problem: Coping: Goal: Ability to adjust to condition or change in health will improve Outcome: Adequate for Discharge   Problem: Fluid Volume: Goal: Ability to maintain a balanced intake and output will improve Outcome: Adequate for Discharge   Problem: Health Behavior/Discharge Planning: Goal: Ability to identify and utilize available resources and services will improve Outcome: Adequate for Discharge Goal: Ability to manage health-related needs will improve Outcome: Adequate for Discharge   Problem: Metabolic: Goal: Ability to maintain appropriate glucose levels will improve Outcome: Adequate for Discharge   Problem: Nutritional: Goal: Maintenance of adequate nutrition will improve Outcome: Adequate for Discharge Goal: Progress toward achieving an optimal weight will improve Outcome: Adequate for Discharge   Problem: Skin Integrity: Goal: Risk for impaired skin integrity will decrease Outcome: Adequate for Discharge   Problem: Tissue Perfusion: Goal: Adequacy of tissue perfusion will improve Outcome: Adequate for Discharge

## 2023-02-08 ENCOUNTER — Telehealth: Payer: Self-pay | Admitting: *Deleted

## 2023-02-08 ENCOUNTER — Telehealth: Payer: Self-pay | Admitting: Orthopedic Surgery

## 2023-02-08 DIAGNOSIS — I119 Hypertensive heart disease without heart failure: Secondary | ICD-10-CM | POA: Diagnosis not present

## 2023-02-08 DIAGNOSIS — Z96652 Presence of left artificial knee joint: Secondary | ICD-10-CM | POA: Diagnosis not present

## 2023-02-08 DIAGNOSIS — Z471 Aftercare following joint replacement surgery: Secondary | ICD-10-CM | POA: Diagnosis not present

## 2023-02-08 DIAGNOSIS — E1159 Type 2 diabetes mellitus with other circulatory complications: Secondary | ICD-10-CM | POA: Diagnosis not present

## 2023-02-08 DIAGNOSIS — I25118 Atherosclerotic heart disease of native coronary artery with other forms of angina pectoris: Secondary | ICD-10-CM | POA: Diagnosis not present

## 2023-02-08 DIAGNOSIS — D649 Anemia, unspecified: Secondary | ICD-10-CM | POA: Diagnosis not present

## 2023-02-08 DIAGNOSIS — E785 Hyperlipidemia, unspecified: Secondary | ICD-10-CM | POA: Diagnosis not present

## 2023-02-08 DIAGNOSIS — Z6839 Body mass index (BMI) 39.0-39.9, adult: Secondary | ICD-10-CM | POA: Diagnosis not present

## 2023-02-08 DIAGNOSIS — G4733 Obstructive sleep apnea (adult) (pediatric): Secondary | ICD-10-CM | POA: Diagnosis not present

## 2023-02-08 NOTE — Telephone Encounter (Signed)
Gretchen from [facility] called to do a verbal order. 3x this wk and 2x next week. JY#[phone]

## 2023-02-08 NOTE — Transitions of Care (Post Inpatient/ED Visit) (Signed)
02/08/2023  Name: Christine Barrett MRN: [id number] DOB: 10/24/1960  Today's TOC FU Call Status: Today's TOC FU Call Status:: Successful TOC FU Call Completed TOC FU Call Complete Date: 02/08/23  Transition Care Management Follow-up Telephone Call Date of Discharge: 02/05/23 Discharge Facility: [facility]) Type of Discharge: Inpatient Admission Primary Inpatient Discharge Diagnosis:: OA (osteoarthritis) of knee How have you been since you were released from the hospital?: Better Any questions or concerns?: No  Items Reviewed: Did you receive and understand the discharge instructions provided?: Yes Medications obtained,verified, and reconciled?: Yes (Medications Reviewed) Any new allergies since your discharge?: No Dietary orders reviewed?: No Do you have support at home?: Yes People in Home: alone Name of Support/Comfort Primary Source: Terrence  Medications Reviewed Today: Medications Reviewed Today     Reviewed by Luella Cook, RN (Case Manager) on 02/08/23 at 1205  Med List Status: <None>   Medication Order Taking? Sig Documenting Provider Last Dose Status Informant  acetaminophen (TYLENOL) 325 MG tablet [id number] Yes Take 1-2 tablets (325-650 mg total) by mouth every 6 (six) hours as needed for mild pain (pain score 1-3 or temp > 100.5). Cammy Copa, MD Taking Active   albuterol [facility] HFA) 108 [id number] Base) MCG/ACT inhaler [id number] Yes INHALE 2 PUFFS BY MOUTH EVERY 6 HOURS AS NEEDED FOR WHEEZING OR  SHORTNESS  OF  BREATH Raspet, Erin K, PA-C Taking Active Self  amLODipine (NORVASC) 5 MG tablet [id number] Yes Take 1 tablet (5 mg total) by mouth daily. Excell Seltzer, MD Taking Active Self  aspirin 81 MG chewable tablet [id number] Yes Chew 1 tablet (81 mg total) by mouth 2 (two) times daily. Cammy Copa, MD Taking Active   BLACK ELDERBERRY PO [id number] Yes Take 2 each by mouth daily. [provider] Taking Active Self  Blood Glucose Monitoring Suppl  (ONETOUCH VERIO) w/Device KIT [id number] Yes Use to check blood sugar 2 times a day [name], [name], MD Taking Active Self  carvedilol (COREG) 25 MG tablet [id number] Yes TAKE 1 TABLET BY MOUTH TWICE DAILY WITH A MEAL [name], [name], MD Taking Active Self  celecoxib (CELEBREX) 100 MG capsule [id number] Yes Take 1 capsule (100 mg total) by mouth 2 (two) times daily. Cammy Copa, MD Taking Active   Cholecalciferol (VITAMIN D3 PO) [id number] Yes Take 1 tablet by mouth daily. [provider] Taking Active Self  docusate sodium (COLACE) 100 MG capsule [id number] Yes Take 1 capsule (100 mg total) by mouth 2 (two) times daily. Cammy Copa, MD Taking Active   ezetimibe (ZETIA) 10 MG tablet [id number] Yes Take 1 tablet (10 mg total) by mouth daily. Excell Seltzer, MD Taking Active   famotidine (PEPCID) 20 MG tablet [id number] Yes Take 1 tablet (20 mg total) by mouth daily. Excell Seltzer, MD Taking Active Self  fluticasone (FLONASE) 50 MCG/ACT nasal spray [id number] Yes Place 2 sprays into both nostrils daily as needed for allergies or rhinitis. [provider] Taking Active Self  glucose blood (ONETOUCH VERIO) test strip [id number] Yes Use to check blood sugar 2 times a day [name], [name], MD Taking Active Self  hydrochlorothiazide (HYDRODIURIL) 25 MG tablet [id number] Yes TAKE 1 TABLET DAILY [name], [name], MD Taking Active Self  HYDROmorphone (DILAUDID) 2 MG tablet [id number] Yes Take 1 tablet (2 mg total) by mouth every 6 (six) hours as needed for severe pain. Cammy Copa, MD Taking Active   isosorbide mononitrate (IMDUR) 60 MG 24 hr tablet [id number]  Yes Take 2 tablets (120 mg total) by mouth daily. Excell Seltzer, MD Taking Active Self  Lancet Devices (ONE TOUCH DELICA LANCING DEV) MISC [id number] Yes Use to check blood sugar 2 times a day Excell Seltzer, MD Taking Active Self  Lancets Letta Pate DELICA PLUS LANCET30G) MISC [id number] Yes USE TO CHECK BLOOD SUGAR 2 TIMES A DAY  [name], [name], MD Taking Active Self  latanoprost (XALATAN) 0.005 % ophthalmic solution [id number] Yes Place 1 drop into both eyes at bedtime.  [provider] Taking Active Self  levocetirizine (XYZAL) 5 MG tablet [id number] Yes Take 5 mg by mouth daily. [provider] Taking Active Self  losartan (COZAAR) 100 MG tablet [id number] Yes Take 1 tablet (100 mg total) by mouth daily. Excell Seltzer, MD Taking Active Self  methocarbamol (ROBAXIN) 500 MG tablet [id number] Yes Take 1 tablet (500 mg total) by mouth every 8 (eight) hours as needed for muscle spasms. Cammy Copa, MD Taking Active   montelukast (SINGULAIR) 10 MG tablet [id number] Yes Take 1 tablet (10 mg total) by mouth at bedtime. Excell Seltzer, MD Taking Active Self  Multiple Vitamin (MULTIVITAMIN WITH MINERALS) TABS tablet [id number] Yes Take 1 tablet by mouth daily. [provider] Taking Active Self  Multiple Vitamins-Minerals (EMERGEN-C IMMUNE) PACK [id number] Yes Take 1 packet by mouth daily as needed (during cold season). [provider] Taking Active Self  ondansetron (ZOFRAN) 4 MG tablet [id number] Yes Take 1 tablet (4 mg total) by mouth every 6 (six) hours as needed for nausea. Cammy Copa, MD Taking Active   pantoprazole (PROTONIX) 40 MG tablet [id number] Yes TAKE 1 TABLET DAILY [name], [name], MD Taking Active Self  rosuvastatin (CRESTOR) 20 MG tablet [id number] Yes Take 1 tablet (20 mg total) by mouth daily. Excell Seltzer, MD Taking Active Self  timolol (TIMOPTIC) 0.5 % ophthalmic solution [id number] Yes Place 1 drop into both eyes every morning. [provider] Taking Active Self  tirzepatide Greggory Keen) 2.5 MG/0.5ML Pen [id number] Yes INJECT 2.5 MG SUBCUTANEOUSLY WEEKLY [name], [name], MD Taking Active Self  zinc gluconate 50 MG tablet [id number] Yes Take 50 mg by mouth daily. [provider] Taking Active Self            Home Care and Equipment/Supplies: Were  Home Health Services Ordered?: Yes Name of Home Health Agency:: [facility] Has Agency set up a time to come to your home?: Yes First Home Health Visit Date: 02/08/23 Any new equipment or medical supplies ordered?: Yes Name of Medical supply agency?: [facility] Were you able to get the equipment/medical supplies?: Yes Do you have any questions related to the use of the equipment/supplies?: No  Functional Questionnaire: Do you need assistance with bathing/showering or dressing?: Yes Do you need assistance with meal preparation?: Yes Do you need assistance with eating?: No Do you have difficulty maintaining continence: No Do you need assistance with getting out of bed/getting out of a chair/moving?: Yes (using walker/ CPM) Do you have difficulty managing or taking your medications?: No  Follow up appointments reviewed: PCP Follow-up appointment confirmed?: Yes Date of PCP follow-up appointment?: 02/10/23 Follow-up Provider: [facility] Follow-up appointment confirmed?: Yes Date of Specialist follow-up appointment?: 02/17/23 Follow-Up Specialty Provider:: Terrilyn Saver Do you need transportation to your follow-up appointment?: No Do you understand care options if your condition(s) worsen?: Yes-patient verbalized understanding  SDOH Interventions Today    Flowsheet Row Most Recent Value  SDOH Interventions   Food Insecurity Interventions  Intervention Not Indicated  Housing Interventions Intervention Not Indicated  Transportation Interventions Intervention Not Indicated, Patient Resources (Friends/Family)      Interventions Today    Flowsheet Row Most Recent Value  General Interventions   General Interventions Discussed/Reviewed General Interventions Discussed, General Interventions Reviewed, Referral to Nurse, Doctor Visits  [Referred to [facility] Coordination nurse]  Doctor Visits Discussed/Reviewed Doctor Visits Discussed  Exercise Interventions    Exercise Discussed/Reviewed Exercise Discussed  Placide.Kidney PT/OT]  Pharmacy Interventions   Pharmacy Dicussed/Reviewed Pharmacy Topics Discussed       TOC Interventions Today    Flowsheet Row Most Recent Value  TOC Interventions   TOC Interventions Discussed/Reviewed TOC Interventions Discussed, TOC Interventions Reviewed, Arranged PCP follow up within 7 days/Care Guide scheduled        Gean Maidens BSN RN [facility] [phone]

## 2023-02-09 ENCOUNTER — Other Ambulatory Visit: Payer: Self-pay | Admitting: Surgical

## 2023-02-09 ENCOUNTER — Encounter: Payer: Self-pay | Admitting: Orthopedic Surgery

## 2023-02-09 MED ORDER — HYDROMORPHONE HCL 2 MG PO TABS: 1.0000 mg | ORAL_TABLET | Freq: Four times a day (QID) | ORAL | 0 refills | Status: DC | PRN

## 2023-02-09 NOTE — Telephone Encounter (Signed)
Okay for this

## 2023-02-09 NOTE — Telephone Encounter (Signed)
Called and gave verbal ok. She also stated bandage is coming off, has tried to tape it. I gave ok to change it to another waterproof bandage.

## 2023-02-10 ENCOUNTER — Encounter: Payer: Self-pay | Admitting: Family Medicine

## 2023-02-10 ENCOUNTER — Ambulatory Visit (INDEPENDENT_AMBULATORY_CARE_PROVIDER_SITE_OTHER): Payer: Medicaid Other | Admitting: Family Medicine

## 2023-02-10 VITALS — BP 126/80 | HR 98 | Temp 98.7°F | Ht 68.5 in | Wt 272.0 lb

## 2023-02-10 DIAGNOSIS — Z471 Aftercare following joint replacement surgery: Secondary | ICD-10-CM | POA: Diagnosis not present

## 2023-02-10 DIAGNOSIS — Z96652 Presence of left artificial knee joint: Secondary | ICD-10-CM

## 2023-02-10 DIAGNOSIS — Z7985 Long-term (current) use of injectable non-insulin antidiabetic drugs: Secondary | ICD-10-CM | POA: Diagnosis not present

## 2023-02-10 DIAGNOSIS — I25118 Atherosclerotic heart disease of native coronary artery with other forms of angina pectoris: Secondary | ICD-10-CM | POA: Diagnosis not present

## 2023-02-10 DIAGNOSIS — I119 Hypertensive heart disease without heart failure: Secondary | ICD-10-CM | POA: Diagnosis not present

## 2023-02-10 DIAGNOSIS — E785 Hyperlipidemia, unspecified: Secondary | ICD-10-CM | POA: Diagnosis not present

## 2023-02-10 DIAGNOSIS — G4733 Obstructive sleep apnea (adult) (pediatric): Secondary | ICD-10-CM | POA: Diagnosis not present

## 2023-02-10 DIAGNOSIS — D649 Anemia, unspecified: Secondary | ICD-10-CM | POA: Diagnosis not present

## 2023-02-10 DIAGNOSIS — E1159 Type 2 diabetes mellitus with other circulatory complications: Secondary | ICD-10-CM | POA: Diagnosis not present

## 2023-02-10 DIAGNOSIS — Z6839 Body mass index (BMI) 39.0-39.9, adult: Secondary | ICD-10-CM | POA: Diagnosis not present

## 2023-02-10 MED ORDER — ONETOUCH VERIO W/DEVICE KIT
PACK | 0 refills | Status: AC
Start: 2023-02-10 — End: ?

## 2023-02-10 NOTE — Assessment & Plan Note (Signed)
Healing well.  Some associated constipation from hydromorphone.  Encouraged her to increase liquids and treat constipation with MiraLAX as daily as needed.

## 2023-02-10 NOTE — Assessment & Plan Note (Signed)
Chronic, given recent surgery need for tight diabetes control for swift healing post op, I would recommended close follow-up of blood sugars.  She will continue Mounjaro 2.5 mg weekly until follow-up in 3 months.[name]

## 2023-02-10 NOTE — Progress Notes (Signed)
Patient ID: Christine Barrett, female    DOB: [birth year], 61 y.o.   MRN: [id number]  This visit was conducted in person.  BP 126/80   Pulse 98   Temp 98.7 F (37.1 C) (Temporal)   Ht 5' 8.5" (1.74 m)   Wt 272 lb (123.4 kg)   SpO2 (!) 81%   BMI 40.76 kg/m    CC:  Chief Complaint  Patient presents with   Hospitalization Follow-up    Subjective:   HPI: Christine Barrett is a 62 y.o. female presenting on 02/10/2023 for Hospitalization Follow-up  Reviewed recent operative notes from February 02, 2023 for left total knee arthroplasty, Dr. August Saucer  HAs follow up 8/21 with Ortho.   She has a history of type 2 diabetes Lab Results  Component Value Date   HGBA1C 6.7 (H) 01/22/2023  On Mounjaro 2.5 mg weekly... started back this week.  Lost 22 lbs totally. She has noted decreased appetite.   Does not have working machine.. has not been checking CBGs lately.   BP well controlled.  No CP, no SOB.   She has been constipated no BM on stool softner but started miralax snd broth.. had BM today. Using hydromorphone for pain.. using every 6 hours.. no today.  Wt Readings from Last 3 Encounters:  02/10/23 272 lb (123.4 kg)  02/02/23 270 lb (122.5 kg)  01/22/23 282 lb 9.6 oz (128.2 kg)    Hg normal pre op 12.4    Relevant past medical, surgical, family and social history reviewed and updated as indicated. Interim medical history since our last visit reviewed. Allergies and medications reviewed and updated. Outpatient Medications Prior to Visit  Medication Sig Dispense Refill   acetaminophen (TYLENOL) 325 MG tablet Take 1-2 tablets (325-650 mg total) by mouth every 6 (six) hours as needed for mild pain (pain score 1-3 or temp > 100.5). 60 tablet 0   albuterol (VENTOLIN HFA) 108 (90 Base) MCG/ACT inhaler INHALE 2 PUFFS BY MOUTH EVERY 6 HOURS AS NEEDED FOR WHEEZING OR  SHORTNESS  OF  BREATH 18 g 0   amLODipine (NORVASC) 5 MG tablet Take 1 tablet (5 mg total) by mouth daily. 180 tablet 1   aspirin  81 MG chewable tablet Chew 1 tablet (81 mg total) by mouth 2 (two) times daily. 60 tablet 0   BLACK ELDERBERRY PO Take 2 each by mouth daily.     Blood Glucose Monitoring Suppl (ONETOUCH VERIO) w/Device KIT Use to check blood sugar 2 times a day 1 kit 0   carvedilol (COREG) 25 MG tablet TAKE 1 TABLET BY MOUTH TWICE DAILY WITH A MEAL 180 tablet 1   celecoxib (CELEBREX) 100 MG capsule Take 1 capsule (100 mg total) by mouth 2 (two) times daily. 40 capsule 0   Cholecalciferol (VITAMIN D3 PO) Take 1 tablet by mouth daily.     docusate sodium (COLACE) 100 MG capsule Take 1 capsule (100 mg total) by mouth 2 (two) times daily. 10 capsule 0   ezetimibe (ZETIA) 10 MG tablet Take 1 tablet (10 mg total) by mouth daily. 90 tablet 3   famotidine (PEPCID) 20 MG tablet Take 1 tablet (20 mg total) by mouth daily. 90 tablet 1   fluticasone (FLONASE) 50 MCG/ACT nasal spray Place 2 sprays into both nostrils daily as needed for allergies or rhinitis.     glucose blood (ONETOUCH VERIO) test strip Use to check blood sugar 2 times a day 200 strip 3   hydrochlorothiazide (HYDRODIURIL) 25 MG  tablet TAKE 1 TABLET DAILY 90 tablet 3   HYDROmorphone (DILAUDID) 2 MG tablet Take 0.5-1 tablets (1-2 mg total) by mouth every 6 (six) hours as needed for severe pain. 30 tablet 0   isosorbide mononitrate (IMDUR) 60 MG 24 hr tablet Take 2 tablets (120 mg total) by mouth daily. 180 tablet 0   Lancet Devices (ONE TOUCH DELICA LANCING DEV) MISC Use to check blood sugar 2 times a day 1 each 0   Lancets (ONETOUCH DELICA PLUS LANCET30G) MISC USE TO CHECK BLOOD SUGAR 2 TIMES A DAY 200 each 3   latanoprost (XALATAN) 0.005 % ophthalmic solution Place 1 drop into both eyes at bedtime.      levocetirizine (XYZAL) 5 MG tablet Take 5 mg by mouth daily.     losartan (COZAAR) 100 MG tablet Take 1 tablet (100 mg total) by mouth daily. 90 tablet 1   methocarbamol (ROBAXIN) 500 MG tablet Take 1 tablet (500 mg total) by mouth every 8 (eight) hours as  needed for muscle spasms. 30 tablet 1   montelukast (SINGULAIR) 10 MG tablet Take 1 tablet (10 mg total) by mouth at bedtime. 90 tablet 3   Multiple Vitamin (MULTIVITAMIN WITH MINERALS) TABS tablet Take 1 tablet by mouth daily.     Multiple Vitamins-Minerals (EMERGEN-C IMMUNE) PACK Take 1 packet by mouth daily as needed (during cold season).     ondansetron (ZOFRAN) 4 MG tablet Take 1 tablet (4 mg total) by mouth every 6 (six) hours as needed for nausea. 20 tablet 0   pantoprazole (PROTONIX) 40 MG tablet TAKE 1 TABLET DAILY 90 tablet 3   rosuvastatin (CRESTOR) 20 MG tablet Take 1 tablet (20 mg total) by mouth daily. 90 tablet 3   timolol (TIMOPTIC) 0.5 % ophthalmic solution Place 1 drop into both eyes every morning.     tirzepatide (MOUNJARO) 2.5 MG/0.5ML Pen INJECT 2.5 MG SUBCUTANEOUSLY WEEKLY 6 mL 0   zinc gluconate 50 MG tablet Take 50 mg by mouth daily.     No facility-administered medications prior to visit.     Per HPI unless specifically indicated in ROS section below Review of Systems  Constitutional:  Negative for fatigue and fever.  HENT:  Negative for congestion.   Eyes:  Negative for pain.  Respiratory:  Negative for cough and shortness of breath.   Cardiovascular:  Negative for chest pain, palpitations and leg swelling.  Gastrointestinal:  Negative for abdominal pain.  Genitourinary:  Negative for dysuria and vaginal bleeding.  Musculoskeletal:  Negative for back pain.  Neurological:  Negative for syncope, light-headedness and headaches.  Psychiatric/Behavioral:  Negative for dysphoric mood.    Objective:  BP 126/80   Pulse 98   Temp 98.7 F (37.1 C) (Temporal)   Ht 5' 8.5" (1.74 m)   Wt 272 lb (123.4 kg)   SpO2 (!) 81%   BMI 40.76 kg/m   Wt Readings from Last 3 Encounters:  02/10/23 272 lb (123.4 kg)  02/02/23 270 lb (122.5 kg)  01/22/23 282 lb 9.6 oz (128.2 kg)      Physical Exam Constitutional:      General: She is not in acute distress.    Appearance:  Normal appearance. She is well-developed. She is not ill-appearing or toxic-appearing.  HENT:     Head: Normocephalic.     Right Ear: Hearing, tympanic membrane, ear canal and external ear normal. Tympanic membrane is not erythematous, retracted or bulging.     Left Ear: Hearing, tympanic membrane, ear canal and  external ear normal. Tympanic membrane is not erythematous, retracted or bulging.     Nose: No mucosal edema or rhinorrhea.     Right Sinus: No maxillary sinus tenderness or frontal sinus tenderness.     Left Sinus: No maxillary sinus tenderness or frontal sinus tenderness.     Mouth/Throat:     Mouth: Oropharynx is clear and moist and mucous membranes are normal.     Pharynx: Uvula midline.  Eyes:     General: Lids are normal. Lids are everted, no foreign bodies appreciated.     Extraocular Movements: EOM normal.     Conjunctiva/sclera: Conjunctivae normal.     Pupils: Pupils are equal, round, and reactive to light.  Neck:     Thyroid: No thyroid mass or thyromegaly.     Vascular: No carotid bruit.     Trachea: Trachea normal.  Cardiovascular:     Rate and Rhythm: Normal rate and regular rhythm.     Pulses: Normal pulses.     Heart sounds: Normal heart sounds, S1 normal and S2 normal. No murmur heard.    No friction rub. No gallop.  Pulmonary:     Effort: Pulmonary effort is normal. No tachypnea or respiratory distress.     Breath sounds: Normal breath sounds. No decreased breath sounds, wheezing, rhonchi or rales.  Abdominal:     General: Bowel sounds are normal.     Palpations: Abdomen is soft.     Tenderness: There is no abdominal tenderness.  Musculoskeletal:     Cervical back: Normal range of motion and neck supple.     Comments: Well-healing surgical site left anterior knee with bandage in place, moderate swelling, no redness.  Skin:    General: Skin is warm, dry and intact.     Findings: No rash.  Neurological:     Mental Status: She is alert.  Psychiatric:         Mood and Affect: Mood is not anxious or depressed.        Speech: Speech normal.        Behavior: Behavior normal. Behavior is cooperative.        Thought Content: Thought content normal.        Cognition and Memory: Cognition and memory normal.        Judgment: Judgment normal.       Results for orders placed or performed during the hospital encounter of 02/02/23  Glucose, capillary  Result Value Ref Range   Glucose-Capillary 136 (H) 70 - 99 mg/dL  Glucose, capillary  Result Value Ref Range   Glucose-Capillary 116 (H) 70 - 99 mg/dL  Glucose, capillary  Result Value Ref Range   Glucose-Capillary 109 (H) 70 - 99 mg/dL   Comment 1 Notify RN    Comment 2 Document in Chart   Glucose, capillary  Result Value Ref Range   Glucose-Capillary 91 70 - 99 mg/dL  Glucose, capillary  Result Value Ref Range   Glucose-Capillary 114 (H) 70 - 99 mg/dL  Glucose, capillary  Result Value Ref Range   Glucose-Capillary 125 (H) 70 - 99 mg/dL  Glucose, capillary  Result Value Ref Range   Glucose-Capillary 147 (H) 70 - 99 mg/dL  Glucose, capillary  Result Value Ref Range   Glucose-Capillary 162 (H) 70 - 99 mg/dL  Glucose, capillary  Result Value Ref Range   Glucose-Capillary 189 (H) 70 - 99 mg/dL  Glucose, capillary  Result Value Ref Range   Glucose-Capillary 136 (H) 70 - 99 mg/dL  Glucose, capillary  Result Value Ref Range   Glucose-Capillary 196 (H) 70 - 99 mg/dL  Glucose, capillary  Result Value Ref Range   Glucose-Capillary 151 (H) 70 - 99 mg/dL  Glucose, capillary  Result Value Ref Range   Glucose-Capillary 148 (H) 70 - 99 mg/dL  Glucose, capillary  Result Value Ref Range   Glucose-Capillary 135 (H) 70 - 99 mg/dL  Glucose, capillary  Result Value Ref Range   Glucose-Capillary 156 (H) 70 - 99 mg/dL    Assessment and Plan  There are no diagnoses linked to this encounter.  No follow-ups on file.   Kerby Nora, MD

## 2023-02-12 DIAGNOSIS — Z96652 Presence of left artificial knee joint: Secondary | ICD-10-CM | POA: Diagnosis not present

## 2023-02-12 DIAGNOSIS — E1159 Type 2 diabetes mellitus with other circulatory complications: Secondary | ICD-10-CM | POA: Diagnosis not present

## 2023-02-12 DIAGNOSIS — Z6839 Body mass index (BMI) 39.0-39.9, adult: Secondary | ICD-10-CM | POA: Diagnosis not present

## 2023-02-12 DIAGNOSIS — D649 Anemia, unspecified: Secondary | ICD-10-CM | POA: Diagnosis not present

## 2023-02-12 DIAGNOSIS — E785 Hyperlipidemia, unspecified: Secondary | ICD-10-CM | POA: Diagnosis not present

## 2023-02-12 DIAGNOSIS — I25118 Atherosclerotic heart disease of native coronary artery with other forms of angina pectoris: Secondary | ICD-10-CM | POA: Diagnosis not present

## 2023-02-12 DIAGNOSIS — Z471 Aftercare following joint replacement surgery: Secondary | ICD-10-CM | POA: Diagnosis not present

## 2023-02-12 DIAGNOSIS — G4733 Obstructive sleep apnea (adult) (pediatric): Secondary | ICD-10-CM | POA: Diagnosis not present

## 2023-02-12 DIAGNOSIS — I119 Hypertensive heart disease without heart failure: Secondary | ICD-10-CM | POA: Diagnosis not present

## 2023-02-14 DIAGNOSIS — M1712 Unilateral primary osteoarthritis, left knee: Secondary | ICD-10-CM

## 2023-02-15 ENCOUNTER — Ambulatory Visit: Payer: Self-pay

## 2023-02-15 DIAGNOSIS — Z471 Aftercare following joint replacement surgery: Secondary | ICD-10-CM | POA: Diagnosis not present

## 2023-02-15 DIAGNOSIS — D649 Anemia, unspecified: Secondary | ICD-10-CM | POA: Diagnosis not present

## 2023-02-15 DIAGNOSIS — I25118 Atherosclerotic heart disease of native coronary artery with other forms of angina pectoris: Secondary | ICD-10-CM | POA: Diagnosis not present

## 2023-02-15 DIAGNOSIS — Z6839 Body mass index (BMI) 39.0-39.9, adult: Secondary | ICD-10-CM | POA: Diagnosis not present

## 2023-02-15 DIAGNOSIS — Z96652 Presence of left artificial knee joint: Secondary | ICD-10-CM | POA: Diagnosis not present

## 2023-02-15 DIAGNOSIS — G4733 Obstructive sleep apnea (adult) (pediatric): Secondary | ICD-10-CM | POA: Diagnosis not present

## 2023-02-15 DIAGNOSIS — I119 Hypertensive heart disease without heart failure: Secondary | ICD-10-CM | POA: Diagnosis not present

## 2023-02-15 DIAGNOSIS — E1159 Type 2 diabetes mellitus with other circulatory complications: Secondary | ICD-10-CM | POA: Diagnosis not present

## 2023-02-15 DIAGNOSIS — E785 Hyperlipidemia, unspecified: Secondary | ICD-10-CM | POA: Diagnosis not present

## 2023-02-15 NOTE — Patient Outreach (Addendum)
  Care Coordination   Initial Visit Note   02/15/2023 Name: Christine Barrett MRN: [id number] DOB: Jul 25, 1960  Christine Barrett is a 62 y.o. year old female who sees Excell Seltzer, MD for primary care. I spoke with  Christine Barrett by phone today.  What matters to the patients health and wellness today?  Patient states she is doing ok post left TKR on 02/02/23.  Patient states she is using a CPM daily as advised and has home PT 3 x per week.  Patient states she is also walking around in her home several times a day and does her recommended home exercises as advised.  Patient reports pain level is a 2.  She states her leg is still bandaged. She denies any noticeable drainage through bandage.   Patient states her home PT is scheduled to end this week and she will start outpatient therapy going forward.  She reports her daughter in law and son are helping her with her care. Patient states she is scheduled to have her post op follow up with provider on 02/17/23.   Goals Addressed             This Visit's Progress    Ongoing improvement post TKR       Interventions Today    Flowsheet Row Most Recent Value  Chronic Disease   Chronic disease during today's visit Other  [left total knee replacement]  General Interventions   General Interventions Discussed/Reviewed General Interventions Discussed, Doctor Visits  [evaluation of current treatment plan status post left TKR and patients adherence to plan as established by provider. Assessed for pain level, sign/symptoms of infection]  Doctor Visits Discussed/Reviewed Doctor Visits Discussed  [reviewed upcoming provider visits. Advised to keep follow up appointments as recommended.]  Exercise Interventions   Exercise Discussed/Reviewed Exercise Discussed  [Assessed for ongoing home health PT. Advised to do exercises regularly on non PT days.]  Education Interventions   Education Provided Provided Education, Provided Printed Education  [Advised to notify provider for  any increase in pain or signs/ symptoms of infection. Education article on after care for TKR sent to patient in Mychart.]  Pharmacy Interventions   Pharmacy Dicussed/Reviewed Pharmacy Topics Discussed  [medication reviewed and compliance discussed. Take pain medications as recommended by provider.]              SDOH assessments and interventions completed:  Yes  SDOH Interventions Today    Flowsheet Row Most Recent Value  SDOH Interventions   Food Insecurity Interventions Intervention Not Indicated  Housing Interventions Intervention Not Indicated  Transportation Interventions Intervention Not Indicated        Care Coordination Interventions:  No, not indicated   Follow up plan: Follow up call scheduled for 03/04/23    Encounter Outcome:  Pt. Visit Completed   George Ina RN,BSN,CCM [facility] Care Coordination [phone] direct line

## 2023-02-15 NOTE — Patient Instructions (Signed)
Visit Information  Thank you for taking time to visit with me today. Please don't hesitate to contact me if I can be of assistance to you.   Following are the goals we discussed today:   Goals Addressed             This Visit's Progress    Ongoing improvement post TKR       Interventions Today    Flowsheet Row Most Recent Value  Chronic Disease   Chronic disease during today's visit Other  [left total knee replacement]  General Interventions   General Interventions Discussed/Reviewed General Interventions Discussed, Doctor Visits  [evaluation of current treatment plan status post left TKR and patients adherence to plan as established by provider. Assessed for pain level, sign/symptoms of infection]  Doctor Visits Discussed/Reviewed Doctor Visits Discussed  [reviewed upcoming provider visits. Advised to keep follow up appointments as recommended.]  Exercise Interventions   Exercise Discussed/Reviewed Exercise Discussed  [Assessed for ongoing home health PT. Advised to do exercises regularly on non PT days.]  Education Interventions   Education Provided Provided Education  [Advised to notify provider for any increase in pain or signs/ symptoms of infection.]  Pharmacy Interventions   Pharmacy Dicussed/Reviewed Pharmacy Topics Discussed  [medication reviewed and compliance discussed. Take pain medications as recommended by provider.]              Our next appointment is by telephone on 03/04/23 at `10:30 am  Please call the care guide team at [phone] if you need to cancel or reschedule your appointment.   If you are experiencing a Mental Health or Behavioral Health Crisis or need someone to talk to, please call the Suicide and Crisis Lifeline: 988 call 1-800-273-TALK (toll free, 24 hour hotline)  Patient verbalizes understanding of instructions and care plan provided today and agrees to view in MyChart. Active MyChart status and patient understanding of how to access  instructions and care plan via MyChart confirmed with patient.     Christine Ina RN,BSN,CCM [facility] Care Coordination [phone] direct line  Total Knee Replacement, Care After After the procedure, it is common to have stiffness and discomfort, or redness, pain, and swelling around your cut from surgery (incision). You may also have a small amount of blood or clear fluid coming from your incision. Follow these instructions at home: Your doctor may give you more instructions. If you have problems, contact your doctor. Medicines Take over-the-counter and prescription medicines only as told by your doctor. If you were prescribed a blood thinner, take it as told by your doctor. If told, take steps to prevent problems with pooping (constipation). You may need to: Drink enough fluid to keep your pee (urine) pale yellow. Take medicines. You will be told what medicines to take. Eat foods that are high in fiber. These include beans, whole grains, and fresh fruits and vegetables. Limit foods that are high in fat and sugar. These include fried or sweet foods. Incision care  Follow instructions from your doctor about how to take care of your incision. Make sure you: Wash your hands with soap and water for at least 20 seconds before and after you change your bandage. If you cannot use soap and water, use hand sanitizer. Change your bandage. Leave stitches, staples, or skin glue in place for at least 2 weeks. Leave tape strips alone unless you are told to take them off. You may trim the edges of the tape strips if they curl up. Do not take baths, swim,  or use a hot tub until your doctor says it is okay. Check your incision every day for signs of infection. Check for: More redness, swelling, or pain. More fluid or blood. Warmth. Pus or a bad smell. Activity Rest as told by your doctor. Get up to take short walks every 1 to 2 hours. Ask for help if you feel weak or unsteady. Follow instructions from  your doctor about: Using a walker, crutches, or a cane. How much body weight you may safely support on your affected leg (weight-bearing restrictions). How to get out of a bed and chair and how to go up and down stairs. A physical therapist will show you how to do this. Do exercises as told by your doctor or physical therapist. Avoid activities that put stress on your knees. These include running, jumping rope, and doing jumping jacks. Do not play contact sports until your doctor says it is okay. Return to your normal activities when your doctor says that it is safe. Managing pain, stiffness, and swelling  If told, put ice on your knee. To do this: Put ice in a plastic bag or use an icing device (cold flow pad). Follow your doctor's directions about how to use the icing device. Place a towel between your skin and the bag or between your skin and the icing device. Leave the ice on for 20 minutes, 2-3 times a day. Take off the ice if your skin turns bright red. This is very important. If you cannot feel pain, heat, or cold, you have a greater risk of damage to the area. Move your toes often. Raise your leg above the level of your heart while you are sitting or lying down. Use a few pillows to keep your leg straight. Do not put a pillow just under the knee. If the knee is bent for a long time, this may make the knee stiff. Wear elastic knee support as told by your doctor. Safety  To help prevent falls: Keep floors clear of objects you may trip over. Place items that you may need within easy reach. Wear an apron or tool belt with pockets for carrying objects. This leaves your hands free to help with your balance. Do not drive or use machines until your doctor says it is safe. General instructions Wear compression stockings as told by your doctor. Continue with breathing exercises. This helps prevent lung infection. Do not smoke or use any products that contain nicotine or tobacco. If you  need help quitting, ask your doctor. If you plan to visit a dentist: Tell your doctor. Ask about things to do before your teeth are cleaned. Tell your dentist about your new joint. Keep all follow-up visits. Contact a doctor if: You have a fever or chills. You have a cough or feel short of breath. Your medicine is not controlling your pain. You have any of these signs of infection around your incision: More redness, swelling, or pain. More fluid or blood. Warmth. Pus or a bad smell. You fall. Get help right away if: You have very bad pain. You have trouble breathing. You have chest pain. You have pain in your calf or leg. You have redness, swelling, or warmth in your calf or leg. Your incision breaks open after the stitches or staples are taken out. These symptoms may be an emergency. Get help right away. Call your local emergency services (911 in the [place] Do not wait to see if the symptoms will go away. Do not drive  yourself to the hospital. Summary After the procedure, it is common to have stiffness and discomfort, or redness, pain, and swelling around your incision. Follow instructions from your doctor about how to take care of your incision. Use crutches, a walker, or a cane as told by your doctor. If you were prescribed a blood thinner, take it as told by your doctor. Keep all follow-up visits. This information is not intended to replace advice given to you by your health care provider. Make sure you discuss any questions you have with your health care provider. Document Revised: 12/05/2019 Document Reviewed: 12/05/2019 Elsevier Patient Education  2024 [employer].

## 2023-02-17 ENCOUNTER — Ambulatory Visit: Payer: Medicaid Other | Admitting: Surgical

## 2023-02-17 ENCOUNTER — Encounter: Payer: Self-pay | Admitting: Surgical

## 2023-02-17 ENCOUNTER — Other Ambulatory Visit (INDEPENDENT_AMBULATORY_CARE_PROVIDER_SITE_OTHER): Payer: Medicaid Other

## 2023-02-17 DIAGNOSIS — Z96652 Presence of left artificial knee joint: Secondary | ICD-10-CM

## 2023-02-17 DIAGNOSIS — S83272D Complex tear of lateral meniscus, current injury, left knee, subsequent encounter: Secondary | ICD-10-CM

## 2023-02-17 NOTE — Progress Notes (Signed)
Post-Op Visit Note   Patient: Christine Barrett           Date of Birth: 28-Aug-1960           MRN: [id number] Visit Date: 02/17/2023 PCP: Excell Seltzer, MD   Assessment & Plan:  Chief Complaint:  Chief Complaint  Patient presents with   Left Knee - Routine Post Op   Visit Diagnoses:  1. Status post total knee replacement, left   2. Complex tear of lateral meniscus of left knee as current injury, subsequent encounter     Plan: Christine Barrett is a 62 y.o. female who presents s/p left total knee arthroplasty on 02/02/23.  Doing well overall.  Using CPM machine up to 70 degrees.  They deny any calf pain, shortness of breath, chest pain, abdominal pain.  Pain is overall controlled and taking dilaudid for pain control 1-2 times per day.  Taking aspirin for DVT prophylaxis.  Ambulating with walker. No knee buckling.  She is doing laps around her apartment complex hallway.  Also doing stairs at her apartment complex stairs.  On exam, patient has range of motion 10 degrees extension to 70 degrees of knee flexion.  Incision is healing well without evidence of infection or dehiscence.  2+ DP pulse of the operative extremity.  No calf tenderness, negative Homans' sign.  Able to perform straight leg raise.  Intact ankle dorsiflexion.  MCL with good endpoint.  Plan is continue with home health physical therapy.  Start outpatient physical therapy upstairs.  Needs to focus on extension and flexion but primarily extension.  Also needs quad strengthening and gait training.  Main restriction is needs to be ambulating in knee immobilizer at all times for the first 6 weeks after surgery.  Follow-up in 4 weeks for clinical recheck with Christine Barrett.    Follow-Up Instructions: No follow-ups on file.   Orders:  Orders Placed This Encounter  Procedures   XR Knee 1-2 Views Left   No orders of the defined types were placed in this encounter.   Imaging: No results found.  PMFS History: Patient Active Problem List    Diagnosis Date Noted   Arthritis of left knee 02/14/2023   OA (osteoarthritis) of knee 02/02/2023   Status post total knee replacement, left 02/02/2023   Coronary artery disease involving native heart with angina pectoris, unspecified vessel or lesion type (HCC) 10/29/2021   Perineal irritation in female 08/15/2021   Dysuria 08/15/2021   Plantar fasciitis of right foot 07/04/2021   Trochanteric bursitis of left hip 04/08/2021   Pain of left middle finger 04/08/2021   Other allergic rhinitis 01/01/2021   Frequent episodes of sinusitis 01/01/2021   History of bronchitis 01/01/2021   Allergic conjunctivitis of both eyes 01/01/2021   Family history of breast cancer in sister 09/20/2020   Type 2 diabetes mellitus with other circulatory complications, HTN, CVD (HCC) 05/17/2020   Morbid obesity (HCC) 05/17/2020   BMI 40.0-44.9, adult (HCC) 05/17/2020   Acute non-recurrent maxillary sinusitis 11/04/2016   Acute pain of both knees 08/28/2016   OSA (obstructive sleep apnea) 06/10/2016   Costochondritis 06/09/2016   Bilateral leg cramps 02/07/2016   Peripheral edema 01/29/2016   Hypertension associated with diabetes (HCC)    GERD (gastroesophageal reflux disease) 08/10/2014   Vaginal dryness, menopausal 08/10/2014   CAD S/P percutaneous coronary angioplasty - Ostial AVG Cx - Scoring balloon PTCA 10/03/2013   Hyperlipidemia with target LDL less than 70 10/02/2013   H/O non-ST elevation myocardial  infarction (NSTEMI) 10/02/2013   Normocytic anemia, not due to blood loss    Allergic sinusitis 03/17/2007   MIGRAINE, COMMON W/O INTRACTABLE MIGRAINE 03/16/2007   Past Medical History:  Diagnosis Date   Allergy    SEASONAL   Anemia    Arthritis    CAD S/P percutaneous coronary angioplasty 09/2013   a) Ostial AV G Cx - 2.5 mm Angiosculpt; mid LAD 40-60%; b) Myoview 07/2014: LOW RISK, small-severe fixed inferior defect c/w infarct w/o peri-infarct ischemia.   Chronic bronchitis (HCC)     "frequently; not q yr" (10/03/2013)   Diabetes mellitus without complication (HCC)    Type 2-diet controlled.    Diverticulosis    Essential hypertension    with prior Accelerated HTN   GERD (gastroesophageal reflux disease)    Hiatal hernia    Hx of non-ST elevation myocardial infarction (NSTEMI) 10/01/2013   Due to Accelerated HTN with existing CAD   Hyperlipemia    Migraine    "@ least once/month" (10/03/2013)   Myocardial infarction (HCC)    OSA (obstructive sleep apnea) 06/10/2016   Schatzki's ring    Seasonal allergies    Seizure (HCC)    1 in elementary school, never knew what caused it   Sinusitis    Spinal headache    during C-section of second child.     Family History  Adopted: Yes  Problem Relation Age of Onset   Allergic rhinitis Mother    Diabetes Mother    Sinusitis Mother    Breast cancer Sister    Hypertension Sister    Colon cancer Neg Hx    Heart attack Neg Hx    Stroke Neg Hx     Past Surgical History:  Procedure Laterality Date   ABDOMINAL HYSTERECTOMY  1994   "partial"   CAPSULAR RELEASE Right 06/06/2020   Procedure: RIGHT SHOULDER MANIPULATION UNDER ANESTHESIA, ROTATOR CUFF REPAIR;  Surgeon: Cammy Copa, MD;  Location: [facility] OR;  Service: Orthopedics;  Laterality: Right;   CARDIAC CATHETERIZATION N/A 08/05/2015   Procedure: Left Heart Cath and Coronary Angiography;  Surgeon: Marykay Lex, MD;  Location: [facility] INVASIVE CV LAB;  Service: Cardiovascular;  Laterality: N/A;   CESAREAN SECTION  1989   COLONOSCOPY     CORONARY ANGIOPLASTY  10/03/2013   95% ostial AV G Cx - 2.5 mm AngioSculpt Balloon PTCA; mid LAD 40-60%   LEFT HEART CATHETERIZATION WITH CORONARY ANGIOGRAM N/A 10/02/2013   Procedure: LEFT HEART CATHETERIZATION WITH CORONARY ANGIOGRAM;  Surgeon: Lennette Bihari, MD;  Location: [facility] CATH LAB;  Service: Cardiovascular;  Laterality: N/A;   NASAL SEPTOPLASTY W/ TURBINOPLASTY  ~ 2007   NM MYOVIEW LTD  08/22/2014    Low risk stress nuclear  study with a small, severe, fixed defect in the distal inferior wall/apex suggestive of small prior infarct; no ischemia.  LV Wall Motion:  NL LV Function; NL Wall Motion   PERCUTANEOUS CORONARY STENT INTERVENTION (PCI-S) N/A 10/03/2013   cutting balloon angioplasty only no stent.    SINOSCOPY     TOTAL KNEE ARTHROPLASTY Left 02/02/2023   Procedure: LEFT TOTAL KNEE ARTHROPLASTY;  Surgeon: Cammy Copa, MD;  Location: [facility] OR;  Service: Orthopedics;  Laterality: Left;   TRANSTHORACIC ECHOCARDIOGRAM  10/02/2013   EF 55-60%; mild LVH, no RWMA,    TUBAL LIGATION  1989   Social History   Occupational History   Occupation: Biomedical engineer  Tobacco Use   Smoking status: Never    Passive exposure: Never  Smokeless tobacco: Never  Vaping Use   Vaping status: Never Used  Substance and Sexual Activity   Alcohol use: Not Currently    Comment: occ   Drug use: No   Sexual activity: Yes    Birth control/protection: Surgical

## 2023-02-22 ENCOUNTER — Telehealth: Payer: Self-pay | Admitting: Orthopedic Surgery

## 2023-02-22 NOTE — Telephone Encounter (Signed)
[facility] call from [facility] advised tried to do discharge last week. Charri advised would like to get a verbal order to do discharge visit this week   1 Wk 1.  The number to contact Christine Barrett is [phone]

## 2023-02-23 NOTE — Telephone Encounter (Signed)
Called and gave verbal ok. I talked to the pt as well and she has OP PT already set up

## 2023-02-23 NOTE — Telephone Encounter (Signed)
I think patient's preference is to continue HHPT but if this is not possible due to insurance, okay to discharge and start outpatient PT where she would like

## 2023-02-25 NOTE — Therapy (Signed)
OUTPATIENT PHYSICAL THERAPY LOWER EXTREMITY EVALUATION   Patient Name: Christine Barrett MRN: [id number] DOB:05-06-1961, 62 y.o., female Today's Date: 02/26/2023  END OF SESSION:  PT End of Session - 02/26/23 1020     Visit Number 1    Number of Visits 16    Date for PT Re-Evaluation 04/28/23    Authorization Type Daniel MCD    PT Start Time 0745    PT Stop Time 0830    PT Time Calculation (min) 45 min    Activity Tolerance Patient tolerated treatment well    Behavior During Therapy [facility] for tasks assessed/performed             Past Medical History:  Diagnosis Date   Allergy    SEASONAL   Anemia    Arthritis    CAD S/P percutaneous coronary angioplasty 09/2013   a) Ostial AV G Cx - 2.5 mm Angiosculpt; mid LAD 40-60%; b) Myoview 07/2014: LOW RISK, small-severe fixed inferior defect c/w infarct w/o peri-infarct ischemia.   Chronic bronchitis (HCC)    "frequently; not q yr" (10/03/2013)   Diabetes mellitus without complication (HCC)    Type 2-diet controlled.    Diverticulosis    Essential hypertension    with prior Accelerated HTN   GERD (gastroesophageal reflux disease)    Hiatal hernia    Hx of non-ST elevation myocardial infarction (NSTEMI) 10/01/2013   Due to Accelerated HTN with existing CAD   Hyperlipemia    Migraine    "@ least once/month" (10/03/2013)   Myocardial infarction (HCC)    OSA (obstructive sleep apnea) 06/10/2016   Schatzki's ring    Seasonal allergies    Seizure (HCC)    1 in elementary school, never knew what caused it   Sinusitis    Spinal headache    during C-section of second child.    Past Surgical History:  Procedure Laterality Date   ABDOMINAL HYSTERECTOMY  1994   "partial"   CAPSULAR RELEASE Right 06/06/2020   Procedure: RIGHT SHOULDER MANIPULATION UNDER ANESTHESIA, ROTATOR CUFF REPAIR;  Surgeon: Cammy Copa, MD;  Location: [facility] OR;  Service: Orthopedics;  Laterality: Right;   CARDIAC CATHETERIZATION N/A 08/05/2015   Procedure: Left  Heart Cath and Coronary Angiography;  Surgeon: Marykay Lex, MD;  Location: [facility] INVASIVE CV LAB;  Service: Cardiovascular;  Laterality: N/A;   CESAREAN SECTION  1989   COLONOSCOPY     CORONARY ANGIOPLASTY  10/03/2013   95% ostial AV G Cx - 2.5 mm AngioSculpt Balloon PTCA; mid LAD 40-60%   LEFT HEART CATHETERIZATION WITH CORONARY ANGIOGRAM N/A 10/02/2013   Procedure: LEFT HEART CATHETERIZATION WITH CORONARY ANGIOGRAM;  Surgeon: Lennette Bihari, MD;  Location: [facility] CATH LAB;  Service: Cardiovascular;  Laterality: N/A;   NASAL SEPTOPLASTY W/ TURBINOPLASTY  ~ 2007   NM MYOVIEW LTD  08/22/2014    Low risk stress nuclear study with a small, severe, fixed defect in the distal inferior wall/apex suggestive of small prior infarct; no ischemia.  LV Wall Motion:  NL LV Function; NL Wall Motion   PERCUTANEOUS CORONARY STENT INTERVENTION (PCI-S) N/A 10/03/2013   cutting balloon angioplasty only no stent.    SINOSCOPY     TOTAL KNEE ARTHROPLASTY Left 02/02/2023   Procedure: LEFT TOTAL KNEE ARTHROPLASTY;  Surgeon: Cammy Copa, MD;  Location: [facility] OR;  Service: Orthopedics;  Laterality: Left;   TRANSTHORACIC ECHOCARDIOGRAM  10/02/2013   EF 55-60%; mild LVH, no RWMA,    TUBAL LIGATION  1989   Patient  Active Problem List   Diagnosis Date Noted   Arthritis of left knee 02/14/2023   OA (osteoarthritis) of knee 02/02/2023   Status post total knee replacement, left 02/02/2023   Coronary artery disease involving native heart with angina pectoris, unspecified vessel or lesion type (HCC) 10/29/2021   Perineal irritation in female 08/15/2021   Dysuria 08/15/2021   Plantar fasciitis of right foot 07/04/2021   Trochanteric bursitis of left hip 04/08/2021   Pain of left middle finger 04/08/2021   Other allergic rhinitis 01/01/2021   Frequent episodes of sinusitis 01/01/2021   History of bronchitis 01/01/2021   Allergic conjunctivitis of both eyes 01/01/2021   Family history of breast cancer in sister  09/20/2020   Type 2 diabetes mellitus with other circulatory complications, HTN, CVD (HCC) 05/17/2020   Morbid obesity (HCC) 05/17/2020   BMI 40.0-44.9, adult (HCC) 05/17/2020   Acute non-recurrent maxillary sinusitis 11/04/2016   Acute pain of both knees 08/28/2016   OSA (obstructive sleep apnea) 06/10/2016   Costochondritis 06/09/2016   Bilateral leg cramps 02/07/2016   Peripheral edema 01/29/2016   Hypertension associated with diabetes (HCC)    GERD (gastroesophageal reflux disease) 08/10/2014   Vaginal dryness, menopausal 08/10/2014   CAD S/P percutaneous coronary angioplasty - Ostial AVG Cx - Scoring balloon PTCA 10/03/2013   Hyperlipidemia with target LDL less than 70 10/02/2013   H/O non-ST elevation myocardial infarction (NSTEMI) 10/02/2013   Normocytic anemia, not due to blood loss    Allergic sinusitis 03/17/2007   MIGRAINE, COMMON W/O INTRACTABLE MIGRAINE 03/16/2007    PCP: Excell Seltzer, MD   REFERRING PROVIDER: Julieanne Cotton, PA-C  REFERRING DIAG: [phone] (ICD-10-CM) - Status post total knee replacement, left  THERAPY DIAG:  Acute pain of left knee  Muscle weakness (generalized)  Other abnormalities of gait and mobility  Rationale for Evaluation and Treatment: Rehabilitation  ONSET DATE: 02/02/23 DOS  SUBJECTIVE:   SUBJECTIVE STATEMENT: 02/02/23 L TKA, has been ambulating with RW as well negotiating stairs with step through pattern. Pain levels minimal just stiff and sore  PERTINENT HISTORY: 1. Status post total knee replacement, left   2. Complex tear of lateral meniscus of left knee as current injury, subsequent encounter       Plan: Christine Barrett is a 62 y.o. female who presents s/p left total knee arthroplasty on 02/02/23.  Doing well overall.  Using CPM machine up to 70 degrees.  They deny any calf pain, shortness of breath, chest pain, abdominal pain.  Pain is overall controlled and taking dilaudid for pain control 1-2 times per day.  Taking aspirin  for DVT prophylaxis.  Ambulating with walker. No knee buckling.  She is doing laps around her apartment complex hallway.  Also doing stairs at her apartment complex stairs.   On exam, patient has range of motion 10 degrees extension to 70 degrees of knee flexion.  Incision is healing well without evidence of infection or dehiscence.  2+ DP pulse of the operative extremity.  No calf tenderness, negative Homans' sign.  Able to perform straight leg raise.  Intact ankle dorsiflexion.  MCL with good endpoint.   Plan is continue with home health physical therapy.  Start outpatient physical therapy upstairs.  Needs to focus on extension and flexion but primarily extension.  Also needs quad strengthening and gait training.  Main restriction is needs to be ambulating in knee immobilizer at all times for the first 6 weeks after surgery.  Follow-up in 4 weeks for clinical recheck  with Dr. August Saucer.   PAIN:  Are you having pain? Yes: NPRS scale: 10/10 Pain location: L knee Pain description: achy, stiff Aggravating factors: ROM, activity Relieving factors: rest  PRECAUTIONS: Knee  RED FLAGS: None   WEIGHT BEARING RESTRICTIONS: No  FALLS:  Has patient fallen in last 6 months? No    OCCUPATION: ins broker  PLOF: Independent  PATIENT GOALS: To regain the function in my L knee  NEXT MD VISIT: 4 weeks  OBJECTIVE:   DIAGNOSTIC FINDINGS: none  PATIENT SURVEYS:   FOTO: 55(61 predicted)  MUSCLE LENGTH: deferred  POSTURE: No Significant postural limitations  LOWER EXTREMITY ROM:  A/PROM Right eval Left eval  Hip flexion    Hip extension    Hip abduction    Hip adduction    Hip internal rotation    Hip external rotation    Knee flexion  75/80d  Knee extension  -25/-10d  Ankle dorsiflexion    Ankle plantarflexion    Ankle inversion    Ankle eversion     (Blank rows = not tested)  LOWER EXTREMITY MMT:  MMT Right eval Left eval  Hip flexion    Hip extension    Hip abduction     Hip adduction    Hip internal rotation    Hip external rotation    Knee flexion  3  Knee extension  3-  Ankle dorsiflexion    Ankle plantarflexion    Ankle inversion    Ankle eversion     (Blank rows = not tested)  FUNCTIONAL TESTS:  30s chair stand 1 rep  GAIT: Distance walked: 78ft x2 Assistive device utilized: Environmental consultant - 2 wheeled Level of assistance: Modified independence Comments: slow cadence with step through pattern   TODAY'S TREATMENT:                                                                                                                              DATE: 02/26/23 Eval and HEP    PATIENT EDUCATION:  Education details: Discussed eval findings, rehab rationale and POC and patient is in agreement  Person educated: Patient Education method: Explanation Education comprehension: verbalized understanding and needs further education  HOME EXERCISE PROGRAM: Access Code: [email or web address] URL: [email or web address] Date: 02/26/2023 Prepared by: Gustavus Bryant  Exercises - Seated Heel Slide  - 5 x daily - 5 x weekly - 1 sets - 10 reps - Long Sitting Quad Set with Towel Roll Under Heel  - 5 x daily - 5 x weekly - 1 sets - 10 reps - 3s hold - Supine Heel Slide with Strap  - 5 x daily - 5 x weekly - 1 sets - 10 reps - Small Range Straight Leg Raise  - 5 x daily - 5 x weekly - 1 sets - 10 reps  ASSESSMENT:  CLINICAL IMPRESSION: Patient is a 62 y.o. female who was seen today for physical therapy evaluation and treatment for post-op L TKA. Main c/o  is soreness and stiffness as opposed to sharp pain.  ROM limited in flexion and extension, poor L quad activation noted.  Using a RW for mobility w/o reports of knee buckling or giving way.  Has CPM for home use.  Has been negotiating steps with rail and step to pattern.  Good candidate for OPPT to increase ROM, strength and function advancing of DME as appropriate.  OBJECTIVE IMPAIRMENTS: Abnormal gait, decreased  activity tolerance, decreased coordination, decreased endurance, decreased knowledge of condition, decreased knowledge of use of DME, decreased mobility, difficulty walking, decreased ROM, decreased strength, improper body mechanics, and pain.   ACTIVITY LIMITATIONS: carrying, lifting, standing, squatting, and stairs  PERSONAL FACTORS: Age, Fitness, Past/current experiences, and 1 comorbidity: DM  are also affecting patient's functional outcome.   REHAB POTENTIAL: Good  CLINICAL DECISION MAKING: Stable/uncomplicated  EVALUATION COMPLEXITY: Low   GOALS: Goals reviewed with patient? No  SHORT TERM GOALS: Target date: 03/26/2023   Patient to demonstrate independence in HEP  Baseline: [email or web address] Goal status: INITIAL  2.  Increase L knee AROM to 90d flexion and -10d extension Baseline:  A/PROM Right eval Left eval  Hip flexion    Hip extension    Hip abduction    Hip adduction    Hip internal rotation    Hip external rotation    Knee flexion  75/80d  Knee extension  -25/-10d   Goal status: INITIAL  3.  Decrease pain to 4/10 Baseline: 8/10 Goal status: INITIAL    LONG TERM GOALS: Target date: 04/23/2023    Able to ascend 16 steps with LRAD and most appropriate pattern Baseline: 4 steps with B rails and step to pattern Goal status: INITIAL  2.  Increases L knee ROM to -5d extension and 120d flexion Baseline:  A/PROM Right eval Left eval  Hip flexion    Hip extension    Hip abduction    Hip adduction    Hip internal rotation    Hip external rotation    Knee flexion  75/80d  Knee extension  -25/-10d   Goal status: INITIAL  3.  Increase L knee strength to 4/5 Baseline:  MMT Right eval Left eval  Hip flexion    Hip extension    Hip abduction    Hip adduction    Hip internal rotation    Hip external rotation    Knee flexion  3  Knee extension  3-   Goal status: INITIAL  4.  549ft ambulation with LRAD Baseline: 40ft with RW Goal status:  INITIAL  5.  Increase FOTO score to 61 Baseline: 55 Goal status: INITIAL  6.  Increase reps to 6 on 30s chair stand test Baseline: 1 rep Goal status: INITIAL   PLAN:  PT FREQUENCY: 1-2x/week  PT DURATION: 8 weeks  PLANNED INTERVENTIONS: Therapeutic exercises, Therapeutic activity, Neuromuscular re-education, Balance training, Gait training, Patient/Family education, Self Care, Joint mobilization, Stair training, DME instructions, Dry Needling, Electrical stimulation, Cryotherapy, Moist heat, scar mobilization, Manual therapy, and Re-evaluation  PLAN FOR NEXT SESSION: HEP review and update, manual techniques as appropriate, aerobic tasks, ROM and flexibility activities, strengthening and PREs, TPDN, gait and balance training as needed     Hildred Laser, PT 02/26/2023, 1:32 PM   Check all possible CPT codes: 16109 - PT Re-evaluation, 97110- Therapeutic Exercise, [phone]- Neuro Re-education, [phone] - Gait Training, 97140 - Manual Therapy, 97530 - Therapeutic Activities, and 97535 - Self Care    Check all conditions that are expected to impact treatment: {  Conditions expected to impact treatment:Morbid obesity and Diabetes mellitus   If treatment provided at initial evaluation, no treatment charged due to lack of authorization.

## 2023-02-26 ENCOUNTER — Ambulatory Visit: Payer: Medicaid Other | Attending: Surgical

## 2023-02-26 ENCOUNTER — Telehealth: Payer: Self-pay | Admitting: Orthopedic Surgery

## 2023-02-26 ENCOUNTER — Other Ambulatory Visit: Payer: Self-pay

## 2023-02-26 DIAGNOSIS — M6281 Muscle weakness (generalized): Secondary | ICD-10-CM | POA: Insufficient documentation

## 2023-02-26 DIAGNOSIS — R2689 Other abnormalities of gait and mobility: Secondary | ICD-10-CM | POA: Insufficient documentation

## 2023-02-26 DIAGNOSIS — M25562 Pain in left knee: Secondary | ICD-10-CM | POA: Diagnosis not present

## 2023-02-26 DIAGNOSIS — Z96652 Presence of left artificial knee joint: Secondary | ICD-10-CM | POA: Diagnosis not present

## 2023-02-26 NOTE — Discharge Summary (Signed)
Physician Discharge Summary      Patient ID: Christine Barrett MRN: [id number] DOB/AGE: Apr 30, 1961 62 y.o.  Admit date: 02/02/2023 Discharge date: 02/05/2023  Admission Diagnoses:  Principal Problem:   OA (osteoarthritis) of knee Active Problems:   Status post total knee replacement, left   Arthritis of left knee   Discharge Diagnoses:  Same  Surgeries: Procedure(s): LEFT TOTAL KNEE ARTHROPLASTY on 02/02/2023   Consultants:   Discharged Condition: Stable  Hospital Course: Christine Barrett is an 62 y.o. female who was admitted 02/02/2023 with a chief complaint of left knee pain, and found to have a diagnosis of left knee meniscal tear with osteoarthritis.  They were brought to the operating room on 02/02/2023 and underwent the above named procedures.  Pt awoke from anesthesia without complication and was transferred to the floor. On POD1, patient's pain was overall controlled but she had some issues with nausea and difficulty ambulating with PT.  This improved by 8/9 and she was discharged home.  No red flag signs or symptoms throughout her stay..  Pt will f/u with Dr. August Barrett in clinic in ~2 weeks.   Antibiotics given:  Anti-infectives (From admission, onward)    Start     Dose/Rate Route Frequency Ordered Stop   02/03/23 0000  ceFAZolin (ANCEF) IVPB 2g/100 mL premix        2 g 200 mL/hr over 30 Minutes Intravenous Every 8 hours 02/02/23 2119 02/03/23 0907   02/02/23 1725  vancomycin (VANCOCIN) powder  Status:  Discontinued          As needed 02/02/23 1726 02/02/23 2006   02/02/23 1030  ceFAZolin (ANCEF) IVPB 3g/100 mL premix        3 g 200 mL/hr over 30 Minutes Intravenous On call to O.R. 02/02/23 1028 02/02/23 1646   02/02/23 0945  ceFAZolin (ANCEF) IVPB 3g/100 mL premix  Status:  Discontinued        3 g 200 mL/hr over 30 Minutes Intravenous On call to O.R. 02/02/23 4098 02/02/23 1010     .  Recent vital signs:  Vitals:   02/05/23 0751 02/05/23 1445  BP: 120/71 97/62  Pulse: 86 90   Resp:    Temp: 98.2 F (36.8 C) 98 F (36.7 C)  SpO2: 98% 95%    Recent laboratory studies:  Results for orders placed or performed during the hospital encounter of 02/02/23  Glucose, capillary  Result Value Ref Range   Glucose-Capillary 136 (H) 70 - 99 mg/dL  Glucose, capillary  Result Value Ref Range   Glucose-Capillary 116 (H) 70 - 99 mg/dL  Glucose, capillary  Result Value Ref Range   Glucose-Capillary 109 (H) 70 - 99 mg/dL   Comment 1 Notify RN    Comment 2 Document in Chart   Glucose, capillary  Result Value Ref Range   Glucose-Capillary 91 70 - 99 mg/dL  Glucose, capillary  Result Value Ref Range   Glucose-Capillary 114 (H) 70 - 99 mg/dL  Glucose, capillary  Result Value Ref Range   Glucose-Capillary 125 (H) 70 - 99 mg/dL  Glucose, capillary  Result Value Ref Range   Glucose-Capillary 147 (H) 70 - 99 mg/dL  Glucose, capillary  Result Value Ref Range   Glucose-Capillary 162 (H) 70 - 99 mg/dL  Glucose, capillary  Result Value Ref Range   Glucose-Capillary 189 (H) 70 - 99 mg/dL  Glucose, capillary  Result Value Ref Range   Glucose-Capillary 136 (H) 70 - 99 mg/dL  Glucose, capillary  Result Value Ref Range  Glucose-Capillary 196 (H) 70 - 99 mg/dL  Glucose, capillary  Result Value Ref Range   Glucose-Capillary 151 (H) 70 - 99 mg/dL  Glucose, capillary  Result Value Ref Range   Glucose-Capillary 148 (H) 70 - 99 mg/dL  Glucose, capillary  Result Value Ref Range   Glucose-Capillary 135 (H) 70 - 99 mg/dL  Glucose, capillary  Result Value Ref Range   Glucose-Capillary 156 (H) 70 - 99 mg/dL    Discharge Medications:   Allergies as of 02/05/2023       Reactions   Lactose Intolerance (gi)    Nickel Rash   Nickel jewlery causes bumps and rash on skin   Penicillins Rash   Pt states she has taken since intial  reaction without recurrence.  Has patient had a PCN reaction causing immediate rash, facial/tongue/throat swelling, SOB or lightheadedness with  hypotension:  no Has patient had a PCN reaction causing severe rash involving mucus membranes or skin necrosis: no Has patient had a PCN reaction that required hospitalization no Has patient had a PCN reaction occurring within the last 10 years: no If all of the above answers are "NO", then may proceed with Cephalospori        Medication List     STOP taking these medications    clotrimazole 1 % cream Commonly known as: LOTRIMIN   HYDROmorphone 4 MG tablet Commonly known as: Dilaudid   potassium chloride SA 20 MEQ tablet Commonly known as: KLOR-CON M       TAKE these medications    acetaminophen 325 MG tablet Commonly known as: TYLENOL Take 1-2 tablets (325-650 mg total) by mouth every 6 (six) hours as needed for mild pain (pain score 1-3 or temp > 100.5).   albuterol 108 (90 Base) MCG/ACT inhaler Commonly known as: VENTOLIN HFA INHALE 2 PUFFS BY MOUTH EVERY 6 HOURS AS NEEDED FOR WHEEZING OR  SHORTNESS  OF  BREATH   amLODipine 5 MG tablet Commonly known as: NORVASC Take 1 tablet (5 mg total) by mouth daily.   aspirin 81 MG chewable tablet Chew 1 tablet (81 mg total) by mouth 2 (two) times daily. What changed:  how much to take how to take this when to take this additional instructions   BLACK ELDERBERRY PO Take 2 each by mouth daily.   carvedilol 25 MG tablet Commonly known as: COREG TAKE 1 TABLET BY MOUTH TWICE DAILY WITH A MEAL   celecoxib 100 MG capsule Commonly known as: CELEBREX Take 1 capsule (100 mg total) by mouth 2 (two) times daily.   docusate sodium 100 MG capsule Commonly known as: COLACE Take 1 capsule (100 mg total) by mouth 2 (two) times daily.   Emergen-C Immune Pack Take 1 packet by mouth daily as needed (during cold season).   ezetimibe 10 MG tablet Commonly known as: Zetia Take 1 tablet (10 mg total) by mouth daily.   famotidine 20 MG tablet Commonly known as: PEPCID Take 1 tablet (20 mg total) by mouth daily.   fluticasone  50 MCG/ACT nasal spray Commonly known as: FLONASE Place 2 sprays into both nostrils daily as needed for allergies or rhinitis.   hydrochlorothiazide 25 MG tablet Commonly known as: HYDRODIURIL TAKE 1 TABLET DAILY   isosorbide mononitrate 60 MG 24 hr tablet Commonly known as: IMDUR Take 2 tablets (120 mg total) by mouth daily.   latanoprost 0.005 % ophthalmic solution Commonly known as: XALATAN Place 1 drop into both eyes at bedtime.   levocetirizine 5 MG tablet  Commonly known as: XYZAL Take 5 mg by mouth daily.   losartan 100 MG tablet Commonly known as: COZAAR Take 1 tablet (100 mg total) by mouth daily.   methocarbamol 500 MG tablet Commonly known as: ROBAXIN Take 1 tablet (500 mg total) by mouth every 8 (eight) hours as needed for muscle spasms.   montelukast 10 MG tablet Commonly known as: SINGULAIR Take 1 tablet (10 mg total) by mouth at bedtime.   Mounjaro 2.5 MG/0.5ML Pen Generic drug: tirzepatide INJECT 2.5 MG SUBCUTANEOUSLY WEEKLY   multivitamin with minerals Tabs tablet Take 1 tablet by mouth daily.   ondansetron 4 MG tablet Commonly known as: ZOFRAN Take 1 tablet (4 mg total) by mouth every 6 (six) hours as needed for nausea. What changed:  when to take this reasons to take this   ONE TOUCH DELICA LANCING DEV Misc Use to check blood sugar 2 times a day   OneTouch Delica Plus Lancet30G Misc USE TO CHECK BLOOD SUGAR 2 TIMES A DAY   OneTouch Verio test strip Generic drug: glucose blood Use to check blood sugar 2 times a day   pantoprazole 40 MG tablet Commonly known as: PROTONIX TAKE 1 TABLET DAILY   rosuvastatin 20 MG tablet Commonly known as: CRESTOR Take 1 tablet (20 mg total) by mouth daily.   timolol 0.5 % ophthalmic solution Commonly known as: TIMOPTIC Place 1 drop into both eyes every morning.   VITAMIN D3 PO Take 1 tablet by mouth daily.   zinc gluconate 50 MG tablet Take 50 mg by mouth daily.        Diagnostic Studies: XR  Knee 1-2 Views Left  Result Date: 02/17/2023 AP, lateral views of left knee reviewed.  Left total knee prosthesis in good position and alignment without any complicating features.   Disposition: Discharge disposition: 01-Home or Self Care       Discharge Instructions     Call MD / Call 911   Complete by: As directed    If you experience chest pain or shortness of breath, CALL 911 and be transported to the hospital emergency room.  If you develope a fever above 101 F, pus (white drainage) or increased drainage or redness at the wound, or calf pain, call your surgeon's office.   Constipation Prevention   Complete by: As directed    Drink plenty of fluids.  Prune juice may be helpful.  You may use a stool softener, such as Colace (over the counter) 100 mg twice a day.  Use MiraLax (over the counter) for constipation as needed.   Diet - low sodium heart healthy   Complete by: As directed    Increase activity slowly as tolerated   Complete by: As directed    Post-operative opioid taper instructions:   Complete by: As directed    POST-OPERATIVE OPIOID TAPER INSTRUCTIONS: It is important to wean off of your opioid medication as soon as possible. If you do not need pain medication after your surgery it is ok to stop day one. Opioids include: Codeine, Hydrocodone(Norco, Vicodin), Oxycodone(Percocet, oxycontin) and hydromorphone amongst others.  Long term and even short term use of opiods can cause: Increased pain response Dependence Constipation Depression Respiratory depression And more.  Withdrawal symptoms can include Flu like symptoms Nausea, vomiting And more Techniques to manage these symptoms Hydrate well Eat regular healthy meals Stay active Use relaxation techniques(deep breathing, meditating, yoga) Do Not substitute Alcohol to help with tapering If you have been on opioids for less than  two weeks and do not have pain than it is ok to stop all together.  Plan to wean  off of opioids This plan should start within one week post op of your joint replacement. Maintain the same interval or time between taking each dose and first decrease the dose.  Cut the total daily intake of opioids by one tablet each day Next start to increase the time between doses. The last dose that should be eliminated is the evening dose.           Follow-up Information     Excell Seltzer, MD Follow up.   Specialty: Family Medicine Contact information: [address] [place] [address] [phone]         Excell Seltzer, MD .   Specialty: [facility] Contact information: [address] [place] [address] [phone]         [facility] Follow up.   Why: home health PT services will be provided by [facility], start of care within 48 hours post discharge Contact information: [address] [place] [address] [phone]                  Signed: Karenann Cai 02/26/2023, 8:04 AM

## 2023-02-26 NOTE — Telephone Encounter (Signed)
Christine Barrett from [facility] phy called stating she can not get in contact with pt, not only have her contacts not answered but pt will not answer her door either. Christine Barrett has made the decision to discharge the pt. Best call back number is [phone]

## 2023-03-02 NOTE — Telephone Encounter (Signed)
Charisse aware patient is going to outpatient therapy

## 2023-03-03 ENCOUNTER — Telehealth: Payer: Self-pay

## 2023-03-03 NOTE — Telephone Encounter (Signed)
Error

## 2023-03-04 ENCOUNTER — Ambulatory Visit: Payer: Self-pay

## 2023-03-04 NOTE — Patient Instructions (Signed)
Visit Information  Thank you for taking time to visit with me today. Please don't hesitate to contact me if I can be of assistance to you.   Following are the goals we discussed today:   Goals Addressed             This Visit's Progress    Ongoing improvement post TKR       Interventions Today    Flowsheet Row Most Recent Value  Chronic Disease   Chronic disease during today's visit Other, Diabetes  [left total knee replacement]  General Interventions   General Interventions Discussed/Reviewed General Interventions Reviewed, Doctor Visits  [evaluation of current treatment plan for mentioned health conditions and patients adherence to plan as established by provider.  Assessed pain level, wound healing and for new or ongoing symptoms.]  Doctor Visits Discussed/Reviewed Doctor Visits Reviewed  [reviewed upcoming provider visits.  Discussed patients most recent orthopedic follow up appointment on 02/17/23. Advised to keep follow up appointments with providers as recommended.]  Exercise Interventions   Exercise Discussed/Reviewed Physical Activity  [assessed for patients current activity level. Encouraged to continue advised physical therapy exercises and ambulate as much as possible. Complimented patient on her progress.]  Education Interventions   Education Provided Provided Education  [Advised patient to continue to use ice/ heat to knee for pain managment. Advised to contact provider for any new or ongoing symptoms/ concerns.]  Pharmacy Interventions   Pharmacy Dicussed/Reviewed Pharmacy Topics Reviewed  [medications reviewed.  Advised to take medications as prescribed.]              Our next appointment is by telephone on 03/30/23 at 10 am  Please call the care guide team at [phone] if you need to cancel or reschedule your appointment.   If you are experiencing a Mental Health or Behavioral Health Crisis or need someone to talk to, please call the Suicide and Crisis  Lifeline: 988 call 1-800-273-TALK (toll free, 24 hour hotline)  Patient verbalizes understanding of instructions and care plan provided today and agrees to view in MyChart. Active MyChart status and patient understanding of how to access instructions and care plan via MyChart confirmed with patient.     George Ina RN,BSN,CCM [facility] Care Coordination [phone] direct line

## 2023-03-04 NOTE — Patient Outreach (Signed)
  Care Coordination   Follow Up Visit Note   03/04/2023 Name: Christine Barrett MRN: [id number] DOB: 12-03-1960  Christine Barrett is a 62 y.o. year old female who sees Excell Seltzer, MD for primary care. I spoke with  Coy Saunas by phone today.  What matters to the patients health and wellness today?  Patient states she continues to progress with her left knee replacement.  She reports she is walking several times per day inside and outside with walker and climbing stairs daily. She states her home health therapy has ended and now will be starting outpatient therapy.  Patient reports wound incision to left knee has healed well.  She reports her pain level is a 4 however states she experiences more stiffness and soreness than pain. Patient reports having follow up visit with orthopedic surgeon on 02/17/23 and next follow up is scheduled for 03/19/23.    Goals Addressed             This Visit's Progress    Ongoing improvement post TKR       Interventions Today    Flowsheet Row Most Recent Value  Chronic Disease   Chronic disease during today's visit Other, Diabetes  [left total knee replacement]  General Interventions   General Interventions Discussed/Reviewed General Interventions Reviewed, Doctor Visits  [evaluation of current treatment plan for mentioned health conditions and patients adherence to plan as established by provider.  Assessed pain level, wound healing and for new or ongoing symptoms.]  Doctor Visits Discussed/Reviewed Doctor Visits Reviewed  [reviewed upcoming provider visits.  Discussed patients most recent orthopedic follow up appointment on 02/17/23. Advised to keep follow up appointments with providers as recommended.]  Exercise Interventions   Exercise Discussed/Reviewed Physical Activity  [assessed for patients current activity level. Encouraged to continue advised physical therapy exercises and ambulate as much as possible. Complimented patient on her progress.]  Education  Interventions   Education Provided Provided Education  [Advised patient to continue to use ice/ heat to knee for pain managment. Advised to contact provider for any new or ongoing symptoms/ concerns.]  Pharmacy Interventions   Pharmacy Dicussed/Reviewed Pharmacy Topics Reviewed  [medications reviewed.  Advised to take medications as prescribed.]              SDOH assessments and interventions completed:  No     Care Coordination Interventions:  Yes, provided   Follow up plan: Follow up call scheduled for 03/30/23    Encounter Outcome:  Patient Visit Completed   George Ina RN,BSN,CCM [facility] Care Coordination [phone] direct line

## 2023-03-05 ENCOUNTER — Ambulatory Visit: Payer: Medicaid Other | Attending: Surgical | Admitting: Physical Therapy

## 2023-03-05 ENCOUNTER — Ambulatory Visit (INDEPENDENT_AMBULATORY_CARE_PROVIDER_SITE_OTHER): Payer: Medicaid Other

## 2023-03-05 ENCOUNTER — Encounter: Payer: Self-pay | Admitting: Physical Therapy

## 2023-03-05 ENCOUNTER — Other Ambulatory Visit: Payer: Self-pay

## 2023-03-05 DIAGNOSIS — M6281 Muscle weakness (generalized): Secondary | ICD-10-CM | POA: Diagnosis present

## 2023-03-05 DIAGNOSIS — M1712 Unilateral primary osteoarthritis, left knee: Secondary | ICD-10-CM | POA: Diagnosis not present

## 2023-03-05 DIAGNOSIS — M25562 Pain in left knee: Secondary | ICD-10-CM

## 2023-03-05 DIAGNOSIS — R600 Localized edema: Secondary | ICD-10-CM | POA: Insufficient documentation

## 2023-03-05 DIAGNOSIS — R2689 Other abnormalities of gait and mobility: Secondary | ICD-10-CM | POA: Diagnosis present

## 2023-03-05 DIAGNOSIS — M25662 Stiffness of left knee, not elsewhere classified: Secondary | ICD-10-CM | POA: Insufficient documentation

## 2023-03-05 DIAGNOSIS — Z96652 Presence of left artificial knee joint: Secondary | ICD-10-CM | POA: Diagnosis not present

## 2023-03-05 NOTE — Therapy (Signed)
OUTPATIENT PHYSICAL THERAPY TREATMENT   Patient Name: Christine Barrett MRN: [id number] DOB:[birth year], 61 y.o., female Today's Date: 03/05/2023  END OF SESSION:  PT End of Session - 03/05/23 0911     Visit Number 2    Number of Visits 16    Date for PT Re-Evaluation 04/28/23    Authorization Type [employer]    PT Start Time 0845    PT Stop Time 0930    PT Time Calculation (min) 45 min    Activity Tolerance Patient tolerated treatment well    Behavior During Therapy Christine Barrett for tasks assessed/performed              Past Medical History:  Diagnosis Date   Allergy    SEASONAL   Anemia    Arthritis    CAD S/P percutaneous coronary angioplasty 09/2013   a) Ostial AV G Cx - 2.5 mm Angiosculpt; mid LAD 40-60%; b) Myoview 07/2014: LOW RISK, small-severe fixed inferior defect c/w infarct w/o peri-infarct ischemia.   Chronic bronchitis (HCC)    "frequently; not q yr" (10/03/2013)   Diabetes mellitus without complication (HCC)    Type 2-diet controlled.    Diverticulosis    Essential hypertension    with prior Accelerated HTN   GERD (gastroesophageal reflux disease)    Hiatal hernia    Hx of non-ST elevation myocardial infarction (NSTEMI) 10/01/2013   Due to Accelerated HTN with existing CAD   Hyperlipemia    Migraine    "@ least once/month" (10/03/2013)   Myocardial infarction (HCC)    OSA (obstructive sleep apnea) 06/10/2016   Schatzki's ring    Seasonal allergies    Seizure (HCC)    1 in elementary school, never knew what caused it   Sinusitis    Spinal headache    during C-section of second child.    Past Surgical History:  Procedure Laterality Date   ABDOMINAL HYSTERECTOMY  1994   "partial"   CAPSULAR RELEASE Right 06/06/2020   Procedure: RIGHT SHOULDER MANIPULATION UNDER ANESTHESIA, ROTATOR CUFF REPAIR;  Surgeon: Cammy Copa, MD;  Location: [facility] OR;  Service: Orthopedics;  Laterality: Right;   CARDIAC CATHETERIZATION N/A 08/05/2015   Procedure: Left Heart Cath and  Coronary Angiography;  Surgeon: Marykay Lex, MD;  [facility] INVASIVE CV LAB;  Service: Cardiovascular;  Laterality: N/A;   CESAREAN SECTION  1989   COLONOSCOPY     CORONARY ANGIOPLASTY  10/03/2013   95% ostial AV G Cx - 2.5 mm AngioSculpt Balloon PTCA; mid LAD 40-60%   LEFT HEART CATHETERIZATION WITH CORONARY ANGIOGRAM N/A 10/02/2013   Procedure: LEFT HEART CATHETERIZATION WITH CORONARY ANGIOGRAM;  Surgeon: Lennette Bihari, MD;  Location: [facility] CATH LAB;  Service: Cardiovascular;  Laterality: N/A;   NASAL SEPTOPLASTY W/ TURBINOPLASTY  ~ 2007   NM MYOVIEW LTD  08/22/2014    Low risk stress nuclear study with a small, severe, fixed defect in the distal inferior wall/apex suggestive of small prior infarct; no ischemia.  LV Wall Motion:  NL LV Function; NL Wall Motion   PERCUTANEOUS CORONARY STENT INTERVENTION (PCI-S) N/A 10/03/2013   cutting balloon angioplasty only no stent.    SINOSCOPY     TOTAL KNEE ARTHROPLASTY Left 02/02/2023   Procedure: LEFT TOTAL KNEE ARTHROPLASTY;  Surgeon: Cammy Copa, MD;  Location: [facility] OR;  Service: Orthopedics;  Laterality: Left;   TRANSTHORACIC ECHOCARDIOGRAM  10/02/2013   EF 55-60%; mild LVH, no RWMA,    TUBAL LIGATION  1989   Patient Active  Problem List   Diagnosis Date Noted   Arthritis of left knee 02/14/2023   OA (osteoarthritis) of knee 02/02/2023   Status post total knee replacement, left 02/02/2023   Coronary artery disease involving native heart with angina pectoris, unspecified vessel or lesion type (HCC) 10/29/2021   Perineal irritation in female 08/15/2021   Dysuria 08/15/2021   Plantar fasciitis of right foot 07/04/2021   Trochanteric bursitis of left hip 04/08/2021   Pain of left middle finger 04/08/2021   Other allergic rhinitis 01/01/2021   Frequent episodes of sinusitis 01/01/2021   History of bronchitis 01/01/2021   Allergic conjunctivitis of both eyes 01/01/2021   Family history of breast cancer in sister 09/20/2020   Type 2  diabetes mellitus with other circulatory complications, HTN, CVD (HCC) 05/17/2020   Morbid obesity (HCC) 05/17/2020   BMI 40.0-44.9, adult (HCC) 05/17/2020   Acute non-recurrent maxillary sinusitis 11/04/2016   Acute pain of both knees 08/28/2016   OSA (obstructive sleep apnea) 06/10/2016   Costochondritis 06/09/2016   Bilateral leg cramps 02/07/2016   Peripheral edema 01/29/2016   Hypertension associated with diabetes (HCC)    GERD (gastroesophageal reflux disease) 08/10/2014   Vaginal dryness, menopausal 08/10/2014   CAD S/P percutaneous coronary angioplasty - Ostial AVG Cx - Scoring balloon PTCA 10/03/2013   Hyperlipidemia with target LDL less than 70 10/02/2013   H/O non-ST elevation myocardial infarction (NSTEMI) 10/02/2013   Normocytic anemia, not due to blood loss    Allergic sinusitis 03/17/2007   MIGRAINE, COMMON W/O INTRACTABLE MIGRAINE 03/16/2007    PCP: Christine Seltzer, MD   REFERRING PROVIDER: Julieanne Cotton, PA-C  REFERRING DIAG: [phone] (ICD-10-CM) - Status post total knee replacement, left  THERAPY DIAG:  Acute pain of left knee  Muscle weakness (generalized)  Other abnormalities of gait and mobility  Rationale for Evaluation and Treatment: Rehabilitation  ONSET DATE: 02/02/23 DOS  SUBJECTIVE:   SUBJECTIVE STATEMENT: Patient reports she wasn't able to do much this past week because she was having migraines. States she is not having much pain, is more sore and stiff.  PERTINENT HISTORY: 1. Status post total knee replacement, left   2. Complex tear of lateral meniscus of left knee as current injury, subsequent encounter       Plan: Christine Barrett is a 62 y.o. female who presents s/p left total knee arthroplasty on 02/02/23.  Doing well overall.  Using CPM machine up to 70 degrees.  They deny any calf pain, shortness of breath, chest pain, abdominal pain.  Pain is overall controlled and taking dilaudid for pain control 1-2 times per day.  Taking aspirin for DVT  prophylaxis.  Ambulating with walker. No knee buckling.  She is doing laps around her apartment complex hallway.  Also doing stairs at her apartment complex stairs.   On exam, patient has range of motion 10 degrees extension to 70 degrees of knee flexion.  Incision is healing well without evidence of infection or dehiscence.  2+ DP pulse of the operative extremity.  No calf tenderness, negative Homans' sign.  Able to perform straight leg raise.  Intact ankle dorsiflexion.  MCL with good endpoint.   Plan is continue with home health physical therapy.  Start outpatient physical therapy upstairs.  Needs to focus on extension and flexion but primarily extension.  Also needs quad strengthening and gait training.  Main restriction is needs to be ambulating in knee immobilizer at all times for the first 6 weeks after surgery.  Follow-up in 4  weeks for clinical recheck with Dr. August Saucer.    PAIN:  Are you having pain? Yes:  NPRS scale: 1/10 Pain location: Left knee Pain description: Sore, stiff Aggravating factors: ROM, activity Relieving factors: Rest, ice, heat  PRECAUTIONS: Knee  PATIENT GOALS: To regain the function in my L knee   OBJECTIVE:   PATIENT SURVEYS:   FOTO: 55(61 predicted)  MUSCLE LENGTH: deferred  LOWER EXTREMITY ROM:  A/PROM Right eval Left eval Left 03/05/2023  Hip flexion     Hip extension     Hip abduction     Hip adduction     Hip internal rotation     Hip external rotation     Knee flexion  75/80d 85 AROM / 90 PROM  Knee extension  -25/-10d -6 AROM  Ankle dorsiflexion     Ankle plantarflexion     Ankle inversion     Ankle eversion      (Blank rows = not tested)  LOWER EXTREMITY MMT:  MMT Right eval Left eval  Hip flexion    Hip extension    Hip abduction    Hip adduction    Hip internal rotation    Hip external rotation    Knee flexion  3  Knee extension  3-  Ankle dorsiflexion    Ankle plantarflexion    Ankle inversion    Ankle eversion      (Blank rows = not tested)  FUNCTIONAL TESTS:  30s chair stand 1 rep  GAIT: Distance walked: 43ft x2 Assistive device utilized: Environmental consultant - 2 wheeled Level of assistance: Modified independence Comments: slow cadence with step through pattern   TODAY'S TREATMENT:        [facility] Adult PT Treatment:                                                DATE: 03/05/2023 Therapeutic Exercise: NuStep L3 x 5 min with UE/LE to work on knee motion Quad set with towel roll under knee 10 x 5 sec SLR partial range 3 x 5 SAQ with 3# 2 x 10 Supine heel slide with strap 2 x 5 with 5 sec hold LAQ with 3# x 10 Standing TKE with red x 15 Manual Therapy: Seated and supine knee mobs to improve knee motion Supine patellar mobs all directions  Seated and supine passive knee flexion and extension   PATIENT EDUCATION:  Education details: HEP Person educated: Patient Education method: Explanation Education comprehension: verbalized understanding and needs further education  HOME EXERCISE PROGRAM: Access Code: [email or web address] URL: [email or web address] Date: 02/26/2023 Prepared by: Gustavus Bryant  Exercises - Seated Heel Slide  - 5 x daily - 5 x weekly - 1 sets - 10 reps - Long Sitting Quad Set with Towel Roll Under Heel  - 5 x daily - 5 x weekly - 1 sets - 10 reps - 3s hold - Supine Heel Slide with Strap  - 5 x daily - 5 x weekly - 1 sets - 10 reps - Small Range Straight Leg Raise  - 5 x daily - 5 x weekly - 1 sets - 10 reps   ASSESSMENT:  CLINICAL IMPRESSION: Patient tolerated therapy well with no adverse effects. Therapy focused on progressing the left knee motion and quad strengthening. She demonstrates improvement in her left knee motion this visit and is progressing well with quad strengthening with  increased resistance and initiating closed chain standing exercises. No changes to HEP this visit. Patient would benefit from continued skilled PT to progress her knee mobility and strength in order  to reduce pain and maximize functional ability.  OBJECTIVE IMPAIRMENTS: Abnormal gait, decreased activity tolerance, decreased coordination, decreased endurance, decreased knowledge of condition, decreased knowledge of use of DME, decreased mobility, difficulty walking, decreased ROM, decreased strength, improper body mechanics, and pain.   ACTIVITY LIMITATIONS: carrying, lifting, standing, squatting, and stairs  PERSONAL FACTORS: Age, Fitness, Past/current experiences, and 1 comorbidity: DM  are also affecting patient's functional outcome.    GOALS: Goals reviewed with patient? No  SHORT TERM GOALS: Target date: 03/26/2023   Patient to demonstrate independence in HEP  Baseline: [email or web address] Goal status: INITIAL  2.  Increase L knee AROM to 90d flexion and -10d extension Baseline:  A/PROM Right eval Left eval  Hip flexion    Hip extension    Hip abduction    Hip adduction    Hip internal rotation    Hip external rotation    Knee flexion  75/80d  Knee extension  -25/-10d   Goal status: INITIAL  3.  Decrease pain to 4/10 Baseline: 8/10 Goal status: INITIAL   LONG TERM GOALS: Target date: 04/23/2023    Able to ascend 16 steps with LRAD and most appropriate pattern Baseline: 4 steps with B rails and step to pattern Goal status: INITIAL  2.  Increases L knee ROM to -5d extension and 120d flexion Baseline:  A/PROM Right eval Left eval  Hip flexion    Hip extension    Hip abduction    Hip adduction    Hip internal rotation    Hip external rotation    Knee flexion  75/80d  Knee extension  -25/-10d   Goal status: INITIAL  3.  Increase L knee strength to 4/5 Baseline:  MMT Right eval Left eval  Hip flexion    Hip extension    Hip abduction    Hip adduction    Hip internal rotation    Hip external rotation    Knee flexion  3  Knee extension  3-   Goal status: INITIAL  4.  587ft ambulation with LRAD Baseline: 93ft with RW Goal status: INITIAL  5.   Increase FOTO score to 61 Baseline: 55 Goal status: INITIAL  6.  Increase reps to 6 on 30s chair stand test Baseline: 1 rep Goal status: INITIAL   PLAN:  PT FREQUENCY: 1-2x/week  PT DURATION: 8 weeks  PLANNED INTERVENTIONS: Therapeutic exercises, Therapeutic activity, Neuromuscular re-education, Balance training, Gait training, Patient/Family education, Self Care, Joint mobilization, Stair training, DME instructions, Dry Needling, Electrical stimulation, Cryotherapy, Moist heat, scar mobilization, Manual therapy, and Re-evaluation  PLAN FOR NEXT SESSION: HEP review and update, manual techniques as appropriate, aerobic tasks, ROM and flexibility activities, strengthening and PREs, TPDN, gait and balance training as needed    Rosana Hoes, PT, DPT, LAT, ATC 03/05/23  9:45 AM Phone: [phone] Fax: [phone]

## 2023-03-05 NOTE — Progress Notes (Signed)
Patient came in today to be fitted for a hinged knee brace. I have fit her for an XL brace for left knee. It had great security on knee. No sliding or having to readjust. She was told to wear this during the day to stabilize her knee as she transitions from the walker to her cane. She will come back to her follow up in two weeks with Dean/Luke

## 2023-03-09 NOTE — Therapy (Signed)
OUTPATIENT PHYSICAL THERAPY TREATMENT   Patient Name: Christine Barrett MRN: [id number] DOB:[birth year], 62 y.o., female Today's Date: 03/10/2023  END OF SESSION:  PT End of Session - 03/10/23 0831     Visit Number 3    Number of Visits 16    Date for PT Re-Evaluation 04/28/23    Authorization Type [employer]    PT Start Time 0830    PT Stop Time 0920    PT Time Calculation (min) 50 min    Activity Tolerance Patient tolerated treatment well    Behavior During Therapy [facility] for tasks assessed/performed               Past Medical History:  Diagnosis Date   Allergy    SEASONAL   Anemia    Arthritis    CAD S/P percutaneous coronary angioplasty 09/2013   a) Ostial AV G Cx - 2.5 mm Angiosculpt; mid LAD 40-60%; b) Myoview 07/2014: LOW RISK, small-severe fixed inferior defect c/w infarct w/o peri-infarct ischemia.   Chronic bronchitis (HCC)    "frequently; not q yr" (10/03/2013)   Diabetes mellitus without complication (HCC)    Type 2-diet controlled.    Diverticulosis    Essential hypertension    with prior Accelerated HTN   GERD (gastroesophageal reflux disease)    Hiatal hernia    Hx of non-ST elevation myocardial infarction (NSTEMI) 10/01/2013   Due to Accelerated HTN with existing CAD   Hyperlipemia    Migraine    "@ least once/month" (10/03/2013)   Myocardial infarction (HCC)    OSA (obstructive sleep apnea) 06/10/2016   Schatzki's ring    Seasonal allergies    Seizure (HCC)    1 in elementary school, never knew what caused it   Sinusitis    Spinal headache    during C-section of second child.    Past Surgical History:  Procedure Laterality Date   ABDOMINAL HYSTERECTOMY  1994   "partial"   CAPSULAR RELEASE Right 06/06/2020   Procedure: RIGHT SHOULDER MANIPULATION UNDER ANESTHESIA, ROTATOR CUFF REPAIR;  Surgeon: Cammy Copa, MD;  Location: [facility] OR;  Service: Orthopedics;  Laterality: Right;   CARDIAC CATHETERIZATION N/A 08/05/2015   Procedure: Left Heart Cath and  Coronary Angiography;  Surgeon: Marykay Lex, MD;  Location: [facility] INVASIVE CV LAB;  Service: Cardiovascular;  Laterality: N/A;   CESAREAN SECTION  1989   COLONOSCOPY     CORONARY ANGIOPLASTY  10/03/2013   95% ostial AV G Cx - 2.5 mm AngioSculpt Balloon PTCA; mid LAD 40-60%   LEFT HEART CATHETERIZATION WITH CORONARY ANGIOGRAM N/A 10/02/2013   Procedure: LEFT HEART CATHETERIZATION WITH CORONARY ANGIOGRAM;  Surgeon: Lennette Bihari, MD;  Location: [facility] CATH LAB;  Service: Cardiovascular;  Laterality: N/A;   NASAL SEPTOPLASTY W/ TURBINOPLASTY  ~ 2007   NM MYOVIEW LTD  08/22/2014    Low risk stress nuclear study with a small, severe, fixed defect in the distal inferior wall/apex suggestive of small prior infarct; no ischemia.  LV Wall Motion:  NL LV Function; NL Wall Motion   PERCUTANEOUS CORONARY STENT INTERVENTION (PCI-S) N/A 10/03/2013   cutting balloon angioplasty only no stent.    SINOSCOPY     TOTAL KNEE ARTHROPLASTY Left 02/02/2023   Procedure: LEFT TOTAL KNEE ARTHROPLASTY;  Surgeon: Cammy Copa, MD;  Location: [facility] OR;  Service: Orthopedics;  Laterality: Left;   TRANSTHORACIC ECHOCARDIOGRAM  10/02/2013   EF 55-60%; mild LVH, no RWMA,    TUBAL LIGATION  1989   Patient  Active Problem List   Diagnosis Date Noted   Arthritis of left knee 02/14/2023   OA (osteoarthritis) of knee 02/02/2023   Status post total knee replacement, left 02/02/2023   Coronary artery disease involving native heart with angina pectoris, unspecified vessel or lesion type (HCC) 10/29/2021   Perineal irritation in female 08/15/2021   Dysuria 08/15/2021   Plantar fasciitis of right foot 07/04/2021   Trochanteric bursitis of left hip 04/08/2021   Pain of left middle finger 04/08/2021   Other allergic rhinitis 01/01/2021   Frequent episodes of sinusitis 01/01/2021   History of bronchitis 01/01/2021   Allergic conjunctivitis of both eyes 01/01/2021   Family history of breast cancer in sister 09/20/2020   Type 2  diabetes mellitus with other circulatory complications, HTN, CVD (HCC) 05/17/2020   Morbid obesity (HCC) 05/17/2020   BMI 40.0-44.9, adult (HCC) 05/17/2020   Acute non-recurrent maxillary sinusitis 11/04/2016   Acute pain of both knees 08/28/2016   OSA (obstructive sleep apnea) 06/10/2016   Costochondritis 06/09/2016   Bilateral leg cramps 02/07/2016   Peripheral edema 01/29/2016   Hypertension associated with diabetes (HCC)    GERD (gastroesophageal reflux disease) 08/10/2014   Vaginal dryness, menopausal 08/10/2014   CAD S/P percutaneous coronary angioplasty - Ostial AVG Cx - Scoring balloon PTCA 10/03/2013   Hyperlipidemia with target LDL less than 70 10/02/2013   H/O non-ST elevation myocardial infarction (NSTEMI) 10/02/2013   Normocytic anemia, not due to blood loss    Allergic sinusitis 03/17/2007   MIGRAINE, COMMON W/O INTRACTABLE MIGRAINE 03/16/2007    PCP: Excell Seltzer, MD   REFERRING PROVIDER: Julieanne Cotton, PA-C  REFERRING DIAG: [phone] (ICD-10-CM) - Status post total knee replacement, left  THERAPY DIAG:  Acute pain of left knee  Muscle weakness (generalized)  Other abnormalities of gait and mobility  Localized edema  Stiffness of left knee, not elsewhere classified  Rationale for Evaluation and Treatment: Rehabilitation  ONSET DATE: 02/02/23 DOS  SUBJECTIVE:   SUBJECTIVE STATEMENT: Patient arrives into clinic ambulating with [facility] and Lt knee brace that she received from Dr. August Saucer who stated she should be using it when walking.   PERTINENT HISTORY: 1. Status post total knee replacement, left   2. Complex tear of lateral meniscus of left knee as current injury, subsequent encounter       Plan: Christine Barrett is a 62 y.o. female who presents s/p left total knee arthroplasty on 02/02/23.  Doing well overall.  Using CPM machine up to 70 degrees.  They deny any calf pain, shortness of breath, chest pain, abdominal pain.  Pain is overall controlled and taking  dilaudid for pain control 1-2 times per day.  Taking aspirin for DVT prophylaxis.  Ambulating with walker. No knee buckling.  She is doing laps around her apartment complex hallway.  Also doing stairs at her apartment complex stairs.   On exam, patient has range of motion 10 degrees extension to 70 degrees of knee flexion.  Incision is healing well without evidence of infection or dehiscence.  2+ DP pulse of the operative extremity.  No calf tenderness, negative Homans' sign.  Able to perform straight leg raise.  Intact ankle dorsiflexion.  MCL with good endpoint.   Plan is continue with home health physical therapy.  Start outpatient physical therapy upstairs.  Needs to focus on extension and flexion but primarily extension.  Also needs quad strengthening and gait training.  Main restriction is needs to be ambulating in knee immobilizer at all times  for the first 6 weeks after surgery.  Follow-up in 4 weeks for clinical recheck with Dr. August Saucer.    PAIN:  Are you having pain? Yes:  NPRS scale: 1/10 Pain location: Left knee Pain description: Sore, stiff Aggravating factors: ROM, activity Relieving factors: Rest, ice, heat  PRECAUTIONS: Knee  PATIENT GOALS: To regain the function in my L knee   OBJECTIVE:   PATIENT SURVEYS:   FOTO: 55(61 predicted)  MUSCLE LENGTH: deferred  LOWER EXTREMITY ROM:  A/PROM Right eval Left eval Left 03/05/2023 Left 03/09/23  Hip flexion      Hip extension      Hip abduction      Hip adduction      Hip internal rotation      Hip external rotation      Knee flexion  75/80d 85 AROM / 90 PROM 87 PROM  Knee extension  -25/-10d -6 AROM -5 AROM  Ankle dorsiflexion      Ankle plantarflexion      Ankle inversion      Ankle eversion       (Blank rows = not tested)  LOWER EXTREMITY MMT:  MMT Right eval Left eval  Hip flexion    Hip extension    Hip abduction    Hip adduction    Hip internal rotation    Hip external rotation    Knee flexion  3   Knee extension  3-  Ankle dorsiflexion    Ankle plantarflexion    Ankle inversion    Ankle eversion     (Blank rows = not tested)  FUNCTIONAL TESTS:  30s chair stand 1 rep  GAIT: Distance walked: 32ft x2 Assistive device utilized: Environmental consultant - 2 wheeled Level of assistance: Modified independence Comments: slow cadence with step through pattern   TODAY'S TREATMENT:    [facility] Adult PT Treatment:                                                DATE: 03/10/23 Therapeutic Exercise: NuStep L4 x 5 min with UE/LE to work on knee motion SLR partial range 2 x 5 SAQ with 3# x 10 Supine heel slide with strap 2 x 5 with 5 sec hold LAQ with 3# x 10 Standing TKE with red x 15 STS - focus on weight shifting to LLE Manual Therapy: Seated and supine knee mobs to improve knee motion Supine patellar mobs all directions  Seated and supine passive knee flexion and extension Modalities: Vasopneumatic (Game Ready)   Location:  left knee Time:  10 minutes Pressure:  low Temperature:  34 degrees       [facility] Adult PT Treatment:                                                DATE: 03/05/2023 Therapeutic Exercise: NuStep L3 x 5 min with UE/LE to work on knee motion Quad set with towel roll under knee 10 x 5 sec SLR partial range 3 x 5 SAQ with 3# 2 x 10 Supine heel slide with strap 2 x 5 with 5 sec hold LAQ with 3# x 10 Standing TKE with red x 15 Manual Therapy: Seated and supine knee mobs to improve knee motion Supine  patellar mobs all directions  Seated and supine passive knee flexion and extension   PATIENT EDUCATION:  Education details: HEP Person educated: Patient Education method: Explanation Education comprehension: verbalized understanding and needs further education  HOME EXERCISE PROGRAM: Access Code: [email or web address] URL: [email or web address] Date: 02/26/2023 Prepared by: Gustavus Bryant  Exercises - Seated Heel Slide  - 5 x daily - 5 x weekly - 1 sets - 10 reps - Long  Sitting Quad Set with Towel Roll Under Heel  - 5 x daily - 5 x weekly - 1 sets - 10 reps - 3s hold - Supine Heel Slide with Strap  - 5 x daily - 5 x weekly - 1 sets - 10 reps - Small Range Straight Leg Raise  - 5 x daily - 5 x weekly - 1 sets - 10 reps   ASSESSMENT:  CLINICAL IMPRESSION: Patient presents to PT ambulating with [facility] for first time and utilizing knee brace that she received from surgeon last week. Session today continued to focus on knee ROM and quadriceps strengthening outside of brace to improve functional independence. Since obtaining knee brace she has been standing from sitting keeping her Lt knee straight, encouraged patient to utilize LLE with focus on weight shift to Lt and to bend her knee when going from sitting <-> standing. ROM slightly regressed today with 87 of flexion achieved PROM. Patient continues to benefit from skilled PT services and should be progressed as able to improve functional independence.     OBJECTIVE IMPAIRMENTS: Abnormal gait, decreased activity tolerance, decreased coordination, decreased endurance, decreased knowledge of condition, decreased knowledge of use of DME, decreased mobility, difficulty walking, decreased ROM, decreased strength, improper body mechanics, and pain.   ACTIVITY LIMITATIONS: carrying, lifting, standing, squatting, and stairs  PERSONAL FACTORS: Age, Fitness, Past/current experiences, and 1 comorbidity: DM  are also affecting patient's functional outcome.    GOALS: Goals reviewed with patient? No  SHORT TERM GOALS: Target date: 03/26/2023   Patient to demonstrate independence in HEP  Baseline: [email or web address] Goal status: INITIAL  2.  Increase L knee AROM to 90d flexion and -10d extension Baseline:  A/PROM Right eval Left eval  Hip flexion    Hip extension    Hip abduction    Hip adduction    Hip internal rotation    Hip external rotation    Knee flexion  75/80d  Knee extension  -25/-10d   Goal status:  INITIAL  3.  Decrease pain to 4/10 Baseline: 8/10 Goal status: INITIAL   LONG TERM GOALS: Target date: 04/23/2023    Able to ascend 16 steps with LRAD and most appropriate pattern Baseline: 4 steps with B rails and step to pattern Goal status: INITIAL  2.  Increases L knee ROM to -5d extension and 120d flexion Baseline:  A/PROM Right eval Left eval  Hip flexion    Hip extension    Hip abduction    Hip adduction    Hip internal rotation    Hip external rotation    Knee flexion  75/80d  Knee extension  -25/-10d   Goal status: INITIAL  3.  Increase L knee strength to 4/5 Baseline:  MMT Right eval Left eval  Hip flexion    Hip extension    Hip abduction    Hip adduction    Hip internal rotation    Hip external rotation    Knee flexion  3  Knee extension  3-   Goal status: INITIAL  4.  549ft ambulation with  LRAD Baseline: 4ft with RW Goal status: INITIAL  5.  Increase FOTO score to 61 Baseline: 55 Goal status: INITIAL  6.  Increase reps to 6 on 30s chair stand test Baseline: 1 rep Goal status: INITIAL   PLAN:  PT FREQUENCY: 1-2x/week  PT DURATION: 8 weeks  PLANNED INTERVENTIONS: Therapeutic exercises, Therapeutic activity, Neuromuscular re-education, Balance training, Gait training, Patient/Family education, Self Care, Joint mobilization, Stair training, DME instructions, Dry Needling, Electrical stimulation, Cryotherapy, Moist heat, scar mobilization, Manual therapy, and Re-evaluation  PLAN FOR NEXT SESSION: HEP review and update, manual techniques as appropriate, aerobic tasks, ROM and flexibility activities, strengthening and PREs, TPDN, gait and balance training as needed    Berta Minor PTA 03/10/23  9:14 AM Phone: [phone] Fax: [phone]

## 2023-03-10 ENCOUNTER — Ambulatory Visit: Payer: Medicaid Other

## 2023-03-10 DIAGNOSIS — M25562 Pain in left knee: Secondary | ICD-10-CM

## 2023-03-10 DIAGNOSIS — R2689 Other abnormalities of gait and mobility: Secondary | ICD-10-CM

## 2023-03-10 DIAGNOSIS — M6281 Muscle weakness (generalized): Secondary | ICD-10-CM

## 2023-03-10 DIAGNOSIS — M25662 Stiffness of left knee, not elsewhere classified: Secondary | ICD-10-CM

## 2023-03-10 DIAGNOSIS — R600 Localized edema: Secondary | ICD-10-CM

## 2023-03-11 NOTE — Therapy (Signed)
OUTPATIENT PHYSICAL THERAPY TREATMENT   Patient Name: Christine Barrett MRN: [id number] DOB:1960/10/28, 62 y.o., female Today's Date: 03/12/2023  END OF SESSION:  PT End of Session - 03/12/23 0830     Visit Number 4    Number of Visits 16    Date for PT Re-Evaluation 04/28/23    Authorization Type [employer]    PT Start Time 0830    PT Stop Time 0910    PT Time Calculation (min) 40 min    Activity Tolerance Patient tolerated treatment well    Behavior During Therapy [facility] for tasks assessed/performed                Past Medical History:  Diagnosis Date   Allergy    SEASONAL   Anemia    Arthritis    CAD S/P percutaneous coronary angioplasty 09/2013   a) Ostial AV G Cx - 2.5 mm Angiosculpt; mid LAD 40-60%; b) Myoview 07/2014: LOW RISK, small-severe fixed inferior defect c/w infarct w/o peri-infarct ischemia.   Chronic bronchitis (HCC)    "frequently; not q yr" (10/03/2013)   Diabetes mellitus without complication (HCC)    Type 2-diet controlled.    Diverticulosis    Essential hypertension    with prior Accelerated HTN   GERD (gastroesophageal reflux disease)    Hiatal hernia    Hx of non-ST elevation myocardial infarction (NSTEMI) 10/01/2013   Due to Accelerated HTN with existing CAD   Hyperlipemia    Migraine    "@ least once/month" (10/03/2013)   Myocardial infarction (HCC)    OSA (obstructive sleep apnea) 06/10/2016   Schatzki's ring    Seasonal allergies    Seizure (HCC)    1 in elementary school, never knew what caused it   Sinusitis    Spinal headache    during C-section of second child.    Past Surgical History:  Procedure Laterality Date   ABDOMINAL HYSTERECTOMY  1994   "partial"   CAPSULAR RELEASE Right 06/06/2020   Procedure: RIGHT SHOULDER MANIPULATION UNDER ANESTHESIA, ROTATOR CUFF REPAIR;  Surgeon: Christine Copa, MD;  Location: [facility] OR;  Service: Orthopedics;  Laterality: Right;   CARDIAC CATHETERIZATION N/A 08/05/2015   Procedure: Left Heart Cath  and Coronary Angiography;  Surgeon: Christine Lex, MD;  Location: [facility] INVASIVE CV LAB;  Service: Cardiovascular;  Laterality: N/A;   CESAREAN SECTION  1989   COLONOSCOPY     CORONARY ANGIOPLASTY  10/03/2013   95% ostial AV G Cx - 2.5 mm AngioSculpt Balloon PTCA; mid LAD 40-60%   LEFT HEART CATHETERIZATION WITH CORONARY ANGIOGRAM N/A 10/02/2013   Procedure: LEFT HEART CATHETERIZATION WITH CORONARY ANGIOGRAM;  Surgeon: Christine Bihari, MD;  Location: [facility] CATH LAB;  Service: Cardiovascular;  Laterality: N/A;   NASAL SEPTOPLASTY W/ TURBINOPLASTY  ~ 2007   NM MYOVIEW LTD  08/22/2014    Low risk stress nuclear study with a small, severe, fixed defect in the distal inferior wall/apex suggestive of small prior infarct; no ischemia.  LV Wall Motion:  NL LV Function; NL Wall Motion   PERCUTANEOUS CORONARY STENT INTERVENTION (PCI-S) N/A 10/03/2013   cutting balloon angioplasty only no stent.    SINOSCOPY     TOTAL KNEE ARTHROPLASTY Left 02/02/2023   Procedure: LEFT TOTAL KNEE ARTHROPLASTY;  Surgeon: Christine Copa, MD;  Location: [facility] OR;  Service: Orthopedics;  Laterality: Left;   TRANSTHORACIC ECHOCARDIOGRAM  10/02/2013   EF 55-60%; mild LVH, no RWMA,    TUBAL LIGATION  1989  Patient Active Problem List   Diagnosis Date Noted   Arthritis of left knee 02/14/2023   OA (osteoarthritis) of knee 02/02/2023   Status post total knee replacement, left 02/02/2023   Coronary artery disease involving native heart with angina pectoris, unspecified vessel or lesion type (HCC) 10/29/2021   Perineal irritation in female 08/15/2021   Dysuria 08/15/2021   Plantar fasciitis of right foot 07/04/2021   Trochanteric bursitis of left hip 04/08/2021   Pain of left middle finger 04/08/2021   Other allergic rhinitis 01/01/2021   Frequent episodes of sinusitis 01/01/2021   History of bronchitis 01/01/2021   Allergic conjunctivitis of both eyes 01/01/2021   Family history of breast cancer in sister 09/20/2020   Type  2 diabetes mellitus with other circulatory complications, HTN, CVD (HCC) 05/17/2020   Morbid obesity (HCC) 05/17/2020   BMI 40.0-44.9, adult (HCC) 05/17/2020   Acute non-recurrent maxillary sinusitis 11/04/2016   Acute pain of both knees 08/28/2016   OSA (obstructive sleep apnea) 06/10/2016   Costochondritis 06/09/2016   Bilateral leg cramps 02/07/2016   Peripheral edema 01/29/2016   Hypertension associated with diabetes (HCC)    GERD (gastroesophageal reflux disease) 08/10/2014   Vaginal dryness, menopausal 08/10/2014   CAD S/P percutaneous coronary angioplasty - Ostial AVG Cx - Scoring balloon PTCA 10/03/2013   Hyperlipidemia with target LDL less than 70 10/02/2013   H/O non-ST elevation myocardial infarction (NSTEMI) 10/02/2013   Normocytic anemia, not due to blood loss    Allergic sinusitis 03/17/2007   MIGRAINE, COMMON W/O INTRACTABLE MIGRAINE 03/16/2007    PCP: Christine Seltzer, MD   REFERRING PROVIDER: Julieanne Cotton, PA-C  REFERRING DIAG: [phone] (ICD-10-CM) - Status post total knee replacement, left  THERAPY DIAG:  Acute pain of left knee  Muscle weakness (generalized)  Other abnormalities of gait and mobility  Rationale for Evaluation and Treatment: Rehabilitation  ONSET DATE: 02/02/23 DOS  SUBJECTIVE:   SUBJECTIVE STATEMENT: Describes more soreness and stiffness after last session.  Symptoms lasted a day and was unable to participate in ADLs due to discomfort.  PERTINENT HISTORY: 1. Status post total knee replacement, left   2. Complex tear of lateral meniscus of left knee as current injury, subsequent encounter       Plan: Christine Barrett is a 62 y.o. female who presents s/p left total knee arthroplasty on 02/02/23.  Doing well overall.  Using CPM machine up to 70 degrees.  They deny any calf pain, shortness of breath, chest pain, abdominal pain.  Pain is overall controlled and taking dilaudid for pain control 1-2 times per day.  Taking aspirin for DVT prophylaxis.   Ambulating with walker. No knee buckling.  She is doing laps around her apartment complex hallway.  Also doing stairs at her apartment complex stairs.   On exam, patient has range of motion 10 degrees extension to 70 degrees of knee flexion.  Incision is healing well without evidence of infection or dehiscence.  2+ DP pulse of the operative extremity.  No calf tenderness, negative Homans' sign.  Able to perform straight leg raise.  Intact ankle dorsiflexion.  MCL with good endpoint.   Plan is continue with home health physical therapy.  Start outpatient physical therapy upstairs.  Needs to focus on extension and flexion but primarily extension.  Also needs quad strengthening and gait training.  Main restriction is needs to be ambulating in knee immobilizer at all times for the first 6 weeks after surgery.  Follow-up in 4 weeks for clinical  recheck with Dr. August Saucer.    PAIN:  Are you having pain? Yes:  NPRS scale: 1/10 Pain location: Left knee Pain description: Sore, stiff Aggravating factors: ROM, activity Relieving factors: Rest, ice, heat  PRECAUTIONS: Knee  PATIENT GOALS: To regain the function in my L knee   OBJECTIVE:   PATIENT SURVEYS:   FOTO: 55(61 predicted)  MUSCLE LENGTH: deferred  LOWER EXTREMITY ROM:  A/PROM Right eval Left eval Left 03/05/2023 Left 03/09/23  Hip flexion      Hip extension      Hip abduction      Hip adduction      Hip internal rotation      Hip external rotation      Knee flexion  75/80d 85 AROM / 90 PROM 87 PROM  Knee extension  -25/-10d -6 AROM -5 AROM  Ankle dorsiflexion      Ankle plantarflexion      Ankle inversion      Ankle eversion       (Blank rows = not tested)  LOWER EXTREMITY MMT:  MMT Right eval Left eval  Hip flexion    Hip extension    Hip abduction    Hip adduction    Hip internal rotation    Hip external rotation    Knee flexion  3  Knee extension  3-  Ankle dorsiflexion    Ankle plantarflexion    Ankle inversion     Ankle eversion     (Blank rows = not tested)  FUNCTIONAL TESTS:  30s chair stand 1 rep  GAIT: Distance walked: 71ft x2 Assistive device utilized: Environmental consultant - 2 wheeled Level of assistance: Modified independence Comments: slow cadence with step through pattern   TODAY'S TREATMENT:    [facility] Adult PT Treatment:                                                DATE: 03/12/23 Therapeutic Exercise: Nustep L2 8 min focus on ROM SAQ 15x2 Quad sets 3s 15x Heel slides w/strap 15x 92d TKE in standing GTB 15x Ice over bolster 10 min  Self Care: Reviewed brace donning/doffing emphasizing need to apply brace superior to account for inferior migration.   [facility] Adult PT Treatment:                                                DATE: 03/10/23 Therapeutic Exercise: NuStep L4 x 5 min with UE/LE to work on knee motion SLR partial range 2 x 5 SAQ with 3# x 10 Supine heel slide with strap 2 x 5 with 5 sec hold LAQ with 3# x 10 Standing TKE with red x 15 STS - focus on weight shifting to LLE Manual Therapy: Seated and supine knee mobs to improve knee motion Supine patellar mobs all directions  Seated and supine passive knee flexion and extension Modalities: Vasopneumatic (Game Ready)   Location:  left knee Time:  10 minutes Pressure:  low Temperature:  34 degrees       [facility] Adult PT Treatment:  DATE: 03/05/2023 Therapeutic Exercise: NuStep L3 x 5 min with UE/LE to work on knee motion Quad set with towel roll under knee 10 x 5 sec SLR partial range 3 x 5 SAQ with 3# 2 x 10 Supine heel slide with strap 2 x 5 with 5 sec hold LAQ with 3# x 10 Standing TKE with red x 15 Manual Therapy: Seated and supine knee mobs to improve knee motion Supine patellar mobs all directions  Seated and supine passive knee flexion and extension   PATIENT EDUCATION:  Education details: HEP Person educated: Patient Education method: Explanation Education  comprehension: verbalized understanding and needs further education  HOME EXERCISE PROGRAM: Access Code: [email or web address] URL: [email or web address] Date: 02/26/2023 Prepared by: Gustavus Bryant  Exercises - Seated Heel Slide  - 5 x daily - 5 x weekly - 1 sets - 10 reps - Long Sitting Quad Set with Towel Roll Under Heel  - 5 x daily - 5 x weekly - 1 sets - 10 reps - 3s hold - Supine Heel Slide with Strap  - 5 x daily - 5 x weekly - 1 sets - 10 reps - Small Range Straight Leg Raise  - 5 x daily - 5 x weekly - 1 sets - 10 reps   ASSESSMENT:  CLINICAL IMPRESSION: Focus of today was facilitating L quad contraction using various techniques and concentration on flexion ROM.  Reviewed brace application as noted.  ROM 95d flexion following heel slides. Added CKC TKE to facilitate extension in gait.  Despite ongoin pain issues function and ROM increased.  Patient encouraged to focus on flexion and quad facilitation.    Patient presents to PT ambulating with [facility] for first time and utilizing knee brace that she received from surgeon last week. Session today continued to focus on knee ROM and quadriceps strengthening outside of brace to improve functional independence. Since obtaining knee brace she has been standing from sitting keeping her Lt knee straight, encouraged patient to utilize LLE with focus on weight shift to Lt and to bend her knee when going from sitting <-> standing. ROM slightly regressed today with 87 of flexion achieved PROM. Patient continues to benefit from skilled PT services and should be progressed as able to improve functional independence.     OBJECTIVE IMPAIRMENTS: Abnormal gait, decreased activity tolerance, decreased coordination, decreased endurance, decreased knowledge of condition, decreased knowledge of use of DME, decreased mobility, difficulty walking, decreased ROM, decreased strength, improper body mechanics, and pain.   ACTIVITY LIMITATIONS: carrying,  lifting, standing, squatting, and stairs  PERSONAL FACTORS: Age, Fitness, Past/current experiences, and 1 comorbidity: DM  are also affecting patient's functional outcome.    GOALS: Goals reviewed with patient? No  SHORT TERM GOALS: Target date: 03/26/2023   Patient to demonstrate independence in HEP  Baseline: [email or web address] Goal status: INITIAL  2.  Increase L knee AROM to 90d flexion and -10d extension Baseline:  A/PROM Right eval Left eval  Hip flexion    Hip extension    Hip abduction    Hip adduction    Hip internal rotation    Hip external rotation    Knee flexion  75/80d  Knee extension  -25/-10d   Goal status: INITIAL  3.  Decrease pain to 4/10 Baseline: 8/10 Goal status: INITIAL   LONG TERM GOALS: Target date: 04/23/2023    Able to ascend 16 steps with LRAD and most appropriate pattern Baseline: 4 steps with B rails and step to pattern Goal status: INITIAL  2.  Increases L knee ROM to -5d extension and 120d flexion Baseline:  A/PROM Right eval Left eval  Hip flexion    Hip extension    Hip abduction    Hip adduction    Hip internal rotation    Hip external rotation    Knee flexion  75/80d  Knee extension  -25/-10d   Goal status: INITIAL  3.  Increase L knee strength to 4/5 Baseline:  MMT Right eval Left eval  Hip flexion    Hip extension    Hip abduction    Hip adduction    Hip internal rotation    Hip external rotation    Knee flexion  3  Knee extension  3-   Goal status: INITIAL  4.  521ft ambulation with LRAD Baseline: 86ft with RW Goal status: INITIAL  5.  Increase FOTO score to 61 Baseline: 55 Goal status: INITIAL  6.  Increase reps to 6 on 30s chair stand test Baseline: 1 rep Goal status: INITIAL   PLAN:  PT FREQUENCY: 1-2x/week  PT DURATION: 8 weeks  PLANNED INTERVENTIONS: Therapeutic exercises, Therapeutic activity, Neuromuscular re-education, Balance training, Gait training, Patient/Family education, Self Care,  Joint mobilization, Stair training, DME instructions, Dry Needling, Electrical stimulation, Cryotherapy, Moist heat, scar mobilization, Manual therapy, and Re-evaluation  PLAN FOR NEXT SESSION: HEP review and update, manual techniques as appropriate, aerobic tasks, ROM and flexibility activities, strengthening and PREs, TPDN, gait and balance training as needed    Hildred Laser PT 03/12/23  10:56 AM Phone: [phone] Fax: [phone]

## 2023-03-12 ENCOUNTER — Ambulatory Visit: Payer: Medicaid Other

## 2023-03-12 DIAGNOSIS — M25562 Pain in left knee: Secondary | ICD-10-CM

## 2023-03-12 DIAGNOSIS — M6281 Muscle weakness (generalized): Secondary | ICD-10-CM

## 2023-03-12 DIAGNOSIS — R2689 Other abnormalities of gait and mobility: Secondary | ICD-10-CM

## 2023-03-13 NOTE — Therapy (Signed)
OUTPATIENT PHYSICAL THERAPY TREATMENT   Patient Name: Christine Barrett MRN: [id number] DOB:06-07-1961, 62 y.o., female Today's Date: 03/16/2023  END OF SESSION:  PT End of Session - 03/16/23 0911     Visit Number 5    Number of Visits 16    Date for PT Re-Evaluation 04/28/23    Authorization Type [employer]    PT Start Time 0915    PT Stop Time 0955    PT Time Calculation (min) 40 min    Activity Tolerance Patient tolerated treatment well    Behavior During Therapy [facility] for tasks assessed/performed              Past Medical History:  Diagnosis Date   Allergy    SEASONAL   Anemia    Arthritis    CAD S/P percutaneous coronary angioplasty 09/2013   a) Ostial AV G Cx - 2.5 mm Angiosculpt; mid LAD 40-60%; b) Myoview 07/2014: LOW RISK, small-severe fixed inferior defect c/w infarct w/o peri-infarct ischemia.   Chronic bronchitis (HCC)    "frequently; not q yr" (10/03/2013)   Diabetes mellitus without complication (HCC)    Type 2-diet controlled.    Diverticulosis    Essential hypertension    with prior Accelerated HTN   GERD (gastroesophageal reflux disease)    Hiatal hernia    Hx of non-ST elevation myocardial infarction (NSTEMI) 10/01/2013   Due to Accelerated HTN with existing CAD   Hyperlipemia    Migraine    "@ least once/month" (10/03/2013)   Myocardial infarction (HCC)    OSA (obstructive sleep apnea) 06/10/2016   Schatzki's ring    Seasonal allergies    Seizure (HCC)    1 in elementary school, never knew what caused it   Sinusitis    Spinal headache    during C-section of second child.    Past Surgical History:  Procedure Laterality Date   ABDOMINAL HYSTERECTOMY  1994   "partial"   CAPSULAR RELEASE Right 06/06/2020   Procedure: RIGHT SHOULDER MANIPULATION UNDER ANESTHESIA, ROTATOR CUFF REPAIR;  Surgeon: Cammy Copa, MD;  Location: [facility] OR;  Service: Orthopedics;  Laterality: Right;   CARDIAC CATHETERIZATION N/A 08/05/2015   Procedure: Left Heart Cath and  Coronary Angiography;  Surgeon: Marykay Lex, MD;  Location: [facility] INVASIVE CV LAB;  Service: Cardiovascular;  Laterality: N/A;   CESAREAN SECTION  1989   COLONOSCOPY     CORONARY ANGIOPLASTY  10/03/2013   95% ostial AV G Cx - 2.5 mm AngioSculpt Balloon PTCA; mid LAD 40-60%   LEFT HEART CATHETERIZATION WITH CORONARY ANGIOGRAM N/A 10/02/2013   Procedure: LEFT HEART CATHETERIZATION WITH CORONARY ANGIOGRAM;  Surgeon: Lennette Bihari, MD;  Location: [facility] CATH LAB;  Service: Cardiovascular;  Laterality: N/A;   NASAL SEPTOPLASTY W/ TURBINOPLASTY  ~ 2007   NM MYOVIEW LTD  08/22/2014    Low risk stress nuclear study with a small, severe, fixed defect in the distal inferior wall/apex suggestive of small prior infarct; no ischemia.  LV Wall Motion:  NL LV Function; NL Wall Motion   PERCUTANEOUS CORONARY STENT INTERVENTION (PCI-S) N/A 10/03/2013   cutting balloon angioplasty only no stent.    SINOSCOPY     TOTAL KNEE ARTHROPLASTY Left 02/02/2023   Procedure: LEFT TOTAL KNEE ARTHROPLASTY;  Surgeon: Cammy Copa, MD;  Location: [facility] OR;  Service: Orthopedics;  Laterality: Left;   TRANSTHORACIC ECHOCARDIOGRAM  10/02/2013   EF 55-60%; mild LVH, no RWMA,    TUBAL LIGATION  1989   Patient Active  Problem List   Diagnosis Date Noted   Arthritis of left knee 02/14/2023   OA (osteoarthritis) of knee 02/02/2023   Status post total knee replacement, left 02/02/2023   Coronary artery disease involving native heart with angina pectoris, unspecified vessel or lesion type (HCC) 10/29/2021   Perineal irritation in female 08/15/2021   Dysuria 08/15/2021   Plantar fasciitis of right foot 07/04/2021   Trochanteric bursitis of left hip 04/08/2021   Pain of left middle finger 04/08/2021   Other allergic rhinitis 01/01/2021   Frequent episodes of sinusitis 01/01/2021   History of bronchitis 01/01/2021   Allergic conjunctivitis of both eyes 01/01/2021   Family history of breast cancer in sister 09/20/2020   Type 2  diabetes mellitus with other circulatory complications, HTN, CVD (HCC) 05/17/2020   Morbid obesity (HCC) 05/17/2020   BMI 40.0-44.9, adult (HCC) 05/17/2020   Acute non-recurrent maxillary sinusitis 11/04/2016   Acute pain of both knees 08/28/2016   OSA (obstructive sleep apnea) 06/10/2016   Costochondritis 06/09/2016   Bilateral leg cramps 02/07/2016   Peripheral edema 01/29/2016   Hypertension associated with diabetes (HCC)    GERD (gastroesophageal reflux disease) 08/10/2014   Vaginal dryness, menopausal 08/10/2014   CAD S/P percutaneous coronary angioplasty - Ostial AVG Cx - Scoring balloon PTCA 10/03/2013   Hyperlipidemia with target LDL less than 70 10/02/2013   H/O non-ST elevation myocardial infarction (NSTEMI) 10/02/2013   Normocytic anemia, not due to blood loss    Allergic sinusitis 03/17/2007   MIGRAINE, COMMON W/O INTRACTABLE MIGRAINE 03/16/2007    PCP: Excell Seltzer, MD   REFERRING PROVIDER: Julieanne Cotton, PA-C  REFERRING DIAG: [phone] (ICD-10-CM) - Status post total knee replacement, left  THERAPY DIAG:  Acute pain of left knee  Muscle weakness (generalized)  Other abnormalities of gait and mobility  Localized edema  Stiffness of left knee, not elsewhere classified  Rationale for Evaluation and Treatment: Rehabilitation  ONSET DATE: 02/02/23 DOS  SUBJECTIVE:   SUBJECTIVE STATEMENT: Patient reports that she hit her knee on her bed yesterday and has had increased pain since then and was unable to perform her HEP.   PERTINENT HISTORY: 1. Status post total knee replacement, left   2. Complex tear of lateral meniscus of left knee as current injury, subsequent encounter       Plan: Christine Barrett is a 62 y.o. female who presents s/p left total knee arthroplasty on 02/02/23.  Doing well overall.  Using CPM machine up to 70 degrees.  They deny any calf pain, shortness of breath, chest pain, abdominal pain.  Pain is overall controlled and taking dilaudid for pain  control 1-2 times per day.  Taking aspirin for DVT prophylaxis.  Ambulating with walker. No knee buckling.  She is doing laps around her apartment complex hallway.  Also doing stairs at her apartment complex stairs.   On exam, patient has range of motion 10 degrees extension to 70 degrees of knee flexion.  Incision is healing well without evidence of infection or dehiscence.  2+ DP pulse of the operative extremity.  No calf tenderness, negative Homans' sign.  Able to perform straight leg raise.  Intact ankle dorsiflexion.  MCL with good endpoint.   Plan is continue with home health physical therapy.  Start outpatient physical therapy upstairs.  Needs to focus on extension and flexion but primarily extension.  Also needs quad strengthening and gait training.  Main restriction is needs to be ambulating in knee immobilizer at all times for the  first 6 weeks after surgery.  Follow-up in 4 weeks for clinical recheck with Dr. August Saucer.    PAIN:  Are you having pain? Yes:  NPRS scale: 7/10 Pain location: Left knee Pain description: Sore, stiff Aggravating factors: ROM, activity Relieving factors: Rest, ice, heat  PRECAUTIONS: Knee  PATIENT GOALS: To regain the function in my L knee   OBJECTIVE:   PATIENT SURVEYS:   FOTO: 55(61 predicted)  MUSCLE LENGTH: deferred  LOWER EXTREMITY ROM:  A/PROM Right eval Left eval Left 03/05/2023 Left 03/09/23  Hip flexion      Hip extension      Hip abduction      Hip adduction      Hip internal rotation      Hip external rotation      Knee flexion  75/80d 85 AROM / 90 PROM 87 PROM  Knee extension  -25/-10d -6 AROM -5 AROM  Ankle dorsiflexion      Ankle plantarflexion      Ankle inversion      Ankle eversion       (Blank rows = not tested)  LOWER EXTREMITY MMT:  MMT Right eval Left eval  Hip flexion    Hip extension    Hip abduction    Hip adduction    Hip internal rotation    Hip external rotation    Knee flexion  3  Knee extension  3-   Ankle dorsiflexion    Ankle plantarflexion    Ankle inversion    Ankle eversion     (Blank rows = not tested)  FUNCTIONAL TESTS:  30s chair stand 1 rep  GAIT: Distance walked: 76ft x2 Assistive device utilized: Environmental consultant - 2 wheeled Level of assistance: Modified independence Comments: slow cadence with step through pattern   TODAY'S TREATMENT:  [facility] Adult PT Treatment:                                                DATE: 03/16/23 Therapeutic Exercise: Nustep L2 8 min focus on ROM - moving forward halfway through SLR partial range 2 x 5 SAQ 15x2 Quad sets 3s 15x Heel slides w/strap 15x 92d TKE in standing GTB 15x STS - LLE back RLE fwd x10 Modalities: Vasopneumatic (Game Ready)   Location:  left knee Time:  10 minutes Pressure:  low Temperature:  34 degrees     [facility] Adult PT Treatment:                                                DATE: 03/12/23 Therapeutic Exercise: Nustep L2 8 min focus on ROM SAQ 15x2 Quad sets 3s 15x Heel slides w/strap 15x 92d TKE in standing GTB 15x Ice over bolster 10 min  Self Care: Reviewed brace donning/doffing emphasizing need to apply brace superior to account for inferior migration.   [facility] Adult PT Treatment:                                                DATE: 03/10/23 Therapeutic Exercise: NuStep L4 x 5 min with UE/LE to work on knee motion  SLR partial range 2 x 5 SAQ with 3# x 10 Supine heel slide with strap 2 x 5 with 5 sec hold LAQ with 3# x 10 Standing TKE with red x 15 STS - focus on weight shifting to LLE Manual Therapy: Seated and supine knee mobs to improve knee motion Supine patellar mobs all directions  Seated and supine passive knee flexion and extension Modalities: Vasopneumatic (Game Ready)   Location:  left knee Time:  10 minutes Pressure:  low Temperature:  34 degrees       PATIENT EDUCATION:  Education details: HEP Person educated: Patient Education method: Explanation Education comprehension:  verbalized understanding and needs further education  HOME EXERCISE PROGRAM: Access Code: [email or web address] URL: [email or web address] Date: 02/26/2023 Prepared by: Gustavus Bryant  Exercises - Seated Heel Slide  - 5 x daily - 5 x weekly - 1 sets - 10 reps - Long Sitting Quad Set with Towel Roll Under Heel  - 5 x daily - 5 x weekly - 1 sets - 10 reps - 3s hold - Supine Heel Slide with Strap  - 5 x daily - 5 x weekly - 1 sets - 10 reps - Small Range Straight Leg Raise  - 5 x daily - 5 x weekly - 1 sets - 10 reps   ASSESSMENT:  CLINICAL IMPRESSION:  Patient presents to PT reporting that he hit her knee on her bed yesterday and the pain has been higher since then. Session today continued to focused on knee ROM and quadriceps strengthening. Also incorporated STS with LLE farther back to facilitate use of LLE when stating <-> sitting. Patient continues to benefit from skilled PT services and should be progressed as able to improve functional independence.     OBJECTIVE IMPAIRMENTS: Abnormal gait, decreased activity tolerance, decreased coordination, decreased endurance, decreased knowledge of condition, decreased knowledge of use of DME, decreased mobility, difficulty walking, decreased ROM, decreased strength, improper body mechanics, and pain.   ACTIVITY LIMITATIONS: carrying, lifting, standing, squatting, and stairs  PERSONAL FACTORS: Age, Fitness, Past/current experiences, and 1 comorbidity: DM  are also affecting patient's functional outcome.    GOALS: Goals reviewed with patient? No  SHORT TERM GOALS: Target date: 03/26/2023   Patient to demonstrate independence in HEP  Baseline: [email or web address] Goal status: INITIAL  2.  Increase L knee AROM to 90d flexion and -10d extension Baseline:  A/PROM Right eval Left eval  Hip flexion    Hip extension    Hip abduction    Hip adduction    Hip internal rotation    Hip external rotation    Knee flexion  75/80d  Knee extension   -25/-10d   Goal status: INITIAL  3.  Decrease pain to 4/10 Baseline: 8/10 Goal status: INITIAL   LONG TERM GOALS: Target date: 04/23/2023    Able to ascend 16 steps with LRAD and most appropriate pattern Baseline: 4 steps with B rails and step to pattern Goal status: INITIAL  2.  Increases L knee ROM to -5d extension and 120d flexion Baseline:  A/PROM Right eval Left eval  Hip flexion    Hip extension    Hip abduction    Hip adduction    Hip internal rotation    Hip external rotation    Knee flexion  75/80d  Knee extension  -25/-10d   Goal status: INITIAL  3.  Increase L knee strength to 4/5 Baseline:  MMT Right eval Left eval  Hip flexion    Hip extension  Hip abduction    Hip adduction    Hip internal rotation    Hip external rotation    Knee flexion  3  Knee extension  3-   Goal status: INITIAL  4.  576ft ambulation with LRAD Baseline: 1ft with RW Goal status: INITIAL  5.  Increase FOTO score to 61 Baseline: 55 Goal status: INITIAL  6.  Increase reps to 6 on 30s chair stand test Baseline: 1 rep Goal status: INITIAL   PLAN:  PT FREQUENCY: 1-2x/week  PT DURATION: 8 weeks  PLANNED INTERVENTIONS: Therapeutic exercises, Therapeutic activity, Neuromuscular re-education, Balance training, Gait training, Patient/Family education, Self Care, Joint mobilization, Stair training, DME instructions, Dry Needling, Electrical stimulation, Cryotherapy, Moist heat, scar mobilization, Manual therapy, and Re-evaluation  PLAN FOR NEXT SESSION: HEP review and update, manual techniques as appropriate, aerobic tasks, ROM and flexibility activities, strengthening and PREs, TPDN, gait and balance training as needed    Berta Minor PTA 03/16/23  9:11 AM Phone: [phone] Fax: [phone]

## 2023-03-16 ENCOUNTER — Ambulatory Visit: Payer: Medicaid Other

## 2023-03-16 DIAGNOSIS — M25562 Pain in left knee: Secondary | ICD-10-CM | POA: Diagnosis not present

## 2023-03-16 DIAGNOSIS — M25662 Stiffness of left knee, not elsewhere classified: Secondary | ICD-10-CM

## 2023-03-16 DIAGNOSIS — R2689 Other abnormalities of gait and mobility: Secondary | ICD-10-CM

## 2023-03-16 DIAGNOSIS — R600 Localized edema: Secondary | ICD-10-CM

## 2023-03-16 DIAGNOSIS — M6281 Muscle weakness (generalized): Secondary | ICD-10-CM

## 2023-03-17 NOTE — Therapy (Signed)
OUTPATIENT PHYSICAL THERAPY TREATMENT   Patient Name: Christine Barrett MRN: [id number] DOB:02-21-1961, 62 y.o., female Today's Date: 03/18/2023  END OF SESSION:  PT End of Session - 03/18/23 0828     Visit Number 6    Number of Visits 16    Date for PT Re-Evaluation 04/28/23    Authorization Type [employer]    PT Start Time 0830    PT Stop Time 0920    PT Time Calculation (min) 50 min    Activity Tolerance Patient tolerated treatment well    Behavior During Therapy [facility] for tasks assessed/performed               Past Medical History:  Diagnosis Date   Allergy    SEASONAL   Anemia    Arthritis    CAD S/P percutaneous coronary angioplasty 09/2013   a) Ostial AV G Cx - 2.5 mm Angiosculpt; mid LAD 40-60%; b) Myoview 07/2014: LOW RISK, small-severe fixed inferior defect c/w infarct w/o peri-infarct ischemia.   Chronic bronchitis (HCC)    "frequently; not q yr" (10/03/2013)   Diabetes mellitus without complication (HCC)    Type 2-diet controlled.    Diverticulosis    Essential hypertension    with prior Accelerated HTN   GERD (gastroesophageal reflux disease)    Hiatal hernia    Hx of non-ST elevation myocardial infarction (NSTEMI) 10/01/2013   Due to Accelerated HTN with existing CAD   Hyperlipemia    Migraine    "@ least once/month" (10/03/2013)   Myocardial infarction (HCC)    OSA (obstructive sleep apnea) 06/10/2016   Schatzki's ring    Seasonal allergies    Seizure (HCC)    1 in elementary school, never knew what caused it   Sinusitis    Spinal headache    during C-section of second child.    Past Surgical History:  Procedure Laterality Date   ABDOMINAL HYSTERECTOMY  1994   "partial"   CAPSULAR RELEASE Right 06/06/2020   Procedure: RIGHT SHOULDER MANIPULATION UNDER ANESTHESIA, ROTATOR CUFF REPAIR;  Surgeon: Cammy Copa, MD;  Location: [facility] OR;  Service: Orthopedics;  Laterality: Right;   CARDIAC CATHETERIZATION N/A 08/05/2015   Procedure: Left Heart Cath and  Coronary Angiography;  Surgeon: Marykay Lex, MD;  Location: [facility] INVASIVE CV LAB;  Service: Cardiovascular;  Laterality: N/A;   CESAREAN SECTION  1989   COLONOSCOPY     CORONARY ANGIOPLASTY  10/03/2013   95% ostial AV G Cx - 2.5 mm AngioSculpt Balloon PTCA; mid LAD 40-60%   LEFT HEART CATHETERIZATION WITH CORONARY ANGIOGRAM N/A 10/02/2013   Procedure: LEFT HEART CATHETERIZATION WITH CORONARY ANGIOGRAM;  Surgeon: Lennette Bihari, MD;  Location: [facility] CATH LAB;  Service: Cardiovascular;  Laterality: N/A;   NASAL SEPTOPLASTY W/ TURBINOPLASTY  ~ 2007   NM MYOVIEW LTD  08/22/2014    Low risk stress nuclear study with a small, severe, fixed defect in the distal inferior wall/apex suggestive of small prior infarct; no ischemia.  LV Wall Motion:  NL LV Function; NL Wall Motion   PERCUTANEOUS CORONARY STENT INTERVENTION (PCI-S) N/A 10/03/2013   cutting balloon angioplasty only no stent.    SINOSCOPY     TOTAL KNEE ARTHROPLASTY Left 02/02/2023   Procedure: LEFT TOTAL KNEE ARTHROPLASTY;  Surgeon: Cammy Copa, MD;  Location: [facility] OR;  Service: Orthopedics;  Laterality: Left;   TRANSTHORACIC ECHOCARDIOGRAM  10/02/2013   EF 55-60%; mild LVH, no RWMA,    TUBAL LIGATION  1989   Patient  Active Problem List   Diagnosis Date Noted   Arthritis of left knee 02/14/2023   OA (osteoarthritis) of knee 02/02/2023   Status post total knee replacement, left 02/02/2023   Coronary artery disease involving native heart with angina pectoris, unspecified vessel or lesion type (HCC) 10/29/2021   Perineal irritation in female 08/15/2021   Dysuria 08/15/2021   Plantar fasciitis of right foot 07/04/2021   Trochanteric bursitis of left hip 04/08/2021   Pain of left middle finger 04/08/2021   Other allergic rhinitis 01/01/2021   Frequent episodes of sinusitis 01/01/2021   History of bronchitis 01/01/2021   Allergic conjunctivitis of both eyes 01/01/2021   Family history of breast cancer in sister 09/20/2020   Type 2  diabetes mellitus with other circulatory complications, HTN, CVD (HCC) 05/17/2020   Morbid obesity (HCC) 05/17/2020   BMI 40.0-44.9, adult (HCC) 05/17/2020   Acute non-recurrent maxillary sinusitis 11/04/2016   Acute pain of both knees 08/28/2016   OSA (obstructive sleep apnea) 06/10/2016   Costochondritis 06/09/2016   Bilateral leg cramps 02/07/2016   Peripheral edema 01/29/2016   Hypertension associated with diabetes (HCC)    GERD (gastroesophageal reflux disease) 08/10/2014   Vaginal dryness, menopausal 08/10/2014   CAD S/P percutaneous coronary angioplasty - Ostial AVG Cx - Scoring balloon PTCA 10/03/2013   Hyperlipidemia with target LDL less than 70 10/02/2013   H/O non-ST elevation myocardial infarction (NSTEMI) 10/02/2013   Normocytic anemia, not due to blood loss    Allergic sinusitis 03/17/2007   MIGRAINE, COMMON W/O INTRACTABLE MIGRAINE 03/16/2007    PCP: Excell Seltzer, MD   REFERRING PROVIDER: Julieanne Cotton, PA-C  REFERRING DIAG: [phone] (ICD-10-CM) - Status post total knee replacement, left  THERAPY DIAG:  Acute pain of left knee  Muscle weakness (generalized)  Other abnormalities of gait and mobility  Localized edema  Stiffness of left knee, not elsewhere classified  Rationale for Evaluation and Treatment: Rehabilitation  ONSET DATE: 02/02/23 DOS  SUBJECTIVE:   SUBJECTIVE STATEMENT: Patient reports continued high levels of pain in her Lt knee, will mention to surgeon at her appointment tomorrow. She has not been utilizing knee brace due to it constantly falling down and not staying in place.   PERTINENT HISTORY: 1. Status post total knee replacement, left   2. Complex tear of lateral meniscus of left knee as current injury, subsequent encounter       Plan: Christine Barrett is a 62 y.o. female who presents s/p left total knee arthroplasty on 02/02/23.  Doing well overall.  Using CPM machine up to 70 degrees.  They deny any calf pain, shortness of breath,  chest pain, abdominal pain.  Pain is overall controlled and taking dilaudid for pain control 1-2 times per day.  Taking aspirin for DVT prophylaxis.  Ambulating with walker. No knee buckling.  She is doing laps around her apartment complex hallway.  Also doing stairs at her apartment complex stairs.   On exam, patient has range of motion 10 degrees extension to 70 degrees of knee flexion.  Incision is healing well without evidence of infection or dehiscence.  2+ DP pulse of the operative extremity.  No calf tenderness, negative Homans' sign.  Able to perform straight leg raise.  Intact ankle dorsiflexion.  MCL with good endpoint.   Plan is continue with home health physical therapy.  Start outpatient physical therapy upstairs.  Needs to focus on extension and flexion but primarily extension.  Also needs quad strengthening and gait training.  Main restriction  is needs to be ambulating in knee immobilizer at all times for the first 6 weeks after surgery.  Follow-up in 4 weeks for clinical recheck with Dr. August Saucer.    PAIN:  Are you having pain? Yes:  NPRS scale: 7-8/10 Pain location: Left knee Pain description: Sore, stiff Aggravating factors: ROM, activity Relieving factors: Rest, ice, heat  PRECAUTIONS: Knee  PATIENT GOALS: To regain the function in my L knee   OBJECTIVE:   PATIENT SURVEYS:   FOTO: 55(61 predicted)  MUSCLE LENGTH: deferred  LOWER EXTREMITY ROM:  A/PROM Right eval Left eval Left 03/05/2023 Left 03/09/23 Left 03/18/23  Hip flexion       Hip extension       Hip abduction       Hip adduction       Hip internal rotation       Hip external rotation       Knee flexion  75/80d 85 AROM / 90 PROM 87 PROM 94 AAROM strap  Knee extension  -25/-10d -6 AROM -5 AROM -5 AROM  Ankle dorsiflexion       Ankle plantarflexion       Ankle inversion       Ankle eversion        (Blank rows = not tested)  LOWER EXTREMITY MMT:  MMT Right eval Left eval  Hip flexion    Hip  extension    Hip abduction    Hip adduction    Hip internal rotation    Hip external rotation    Knee flexion  3  Knee extension  3-  Ankle dorsiflexion    Ankle plantarflexion    Ankle inversion    Ankle eversion     (Blank rows = not tested)  FUNCTIONAL TESTS:  30s chair stand 1 rep  GAIT: Distance walked: 22ft x2 Assistive device utilized: Environmental consultant - 2 wheeled Level of assistance: Modified independence Comments: slow cadence with step through pattern   TODAY'S TREATMENT:  [facility] Adult PT Treatment:                                                DATE: 03/17/23 Therapeutic Exercise: Nustep L2 8 min focus on ROM - moving forward halfway through Standing knee flexion stretch on 6" step x20 SLR partial range x 5 SAQ 15x Heel slides w/strap over 1/2 foam roll 15x 94d TKE in standing GTB 15x Modalities: Vasopneumatic (Game Ready)   Location:  left knee Time:  10 minutes Pressure:  low Temperature:  34 degrees   [facility] Adult PT Treatment:                                                DATE: 03/16/23 Therapeutic Exercise: Nustep L2 8 min focus on ROM - moving forward halfway through SLR partial range 2 x 5 SAQ 15x2 Quad sets 3s 15x Heel slides w/strap 15x 92d TKE in standing GTB 15x STS - LLE back RLE fwd x10 Modalities: Vasopneumatic (Game Ready)   Location:  left knee Time:  10 minutes Pressure:  low Temperature:  34 degrees     [facility] Adult PT Treatment:  DATE: 03/12/23 Therapeutic Exercise: Nustep L2 8 min focus on ROM SAQ 15x2 Quad sets 3s 15x Heel slides w/strap 15x 92d TKE in standing GTB 15x Ice over bolster 10 min  Self Care: Reviewed brace donning/doffing emphasizing need to apply brace superior to account for inferior migration.     PATIENT EDUCATION:  Education details: HEP Person educated: Patient Education method: Explanation Education comprehension: verbalized understanding and needs further  education  HOME EXERCISE PROGRAM: Access Code: [email or web address] URL: [email or web address] Date: 02/26/2023 Prepared by: Gustavus Bryant  Exercises - Seated Heel Slide  - 5 x daily - 5 x weekly - 1 sets - 10 reps - Long Sitting Quad Set with Towel Roll Under Heel  - 5 x daily - 5 x weekly - 1 sets - 10 reps - 3s hold - Supine Heel Slide with Strap  - 5 x daily - 5 x weekly - 1 sets - 10 reps - Small Range Straight Leg Raise  - 5 x daily - 5 x weekly - 1 sets - 10 reps   ASSESSMENT:  CLINICAL IMPRESSION:  Patient presents to PT reporting continued high levels of pain in her Lt knee that she attributes to hitting her knee on her bed the other day as well as the recent rainy weather. Session today continued to focus on knee ROM and quadriceps activation. ROM measured at 5-94 today. Patient continues to benefit from skilled PT services and should be progressed as able to improve functional independence.     OBJECTIVE IMPAIRMENTS: Abnormal gait, decreased activity tolerance, decreased coordination, decreased endurance, decreased knowledge of condition, decreased knowledge of use of DME, decreased mobility, difficulty walking, decreased ROM, decreased strength, improper body mechanics, and pain.   ACTIVITY LIMITATIONS: carrying, lifting, standing, squatting, and stairs  PERSONAL FACTORS: Age, Fitness, Past/current experiences, and 1 comorbidity: DM  are also affecting patient's functional outcome.    GOALS: Goals reviewed with patient? No  SHORT TERM GOALS: Target date: 03/26/2023   Patient to demonstrate independence in HEP  Baseline: [email or web address] Goal status: Ongoing  2.  Increase L knee AROM to 90d flexion and -10d extension Baseline:  A/PROM Right eval Left eval  Hip flexion    Hip extension    Hip abduction    Hip adduction    Hip internal rotation    Hip external rotation    Knee flexion  75/80d  Knee extension  -25/-10d   Goal status: Progressing  3.  Decrease  pain to 4/10 Baseline: 8/10 Goal status: Ongoing   LONG TERM GOALS: Target date: 04/23/2023    Able to ascend 16 steps with LRAD and most appropriate pattern Baseline: 4 steps with B rails and step to pattern Goal status: INITIAL  2.  Increases L knee ROM to -5d extension and 120d flexion Baseline:  A/PROM Right eval Left eval  Hip flexion    Hip extension    Hip abduction    Hip adduction    Hip internal rotation    Hip external rotation    Knee flexion  75/80d  Knee extension  -25/-10d   Goal status: INITIAL  3.  Increase L knee strength to 4/5 Baseline:  MMT Right eval Left eval  Hip flexion    Hip extension    Hip abduction    Hip adduction    Hip internal rotation    Hip external rotation    Knee flexion  3  Knee extension  3-   Goal status: INITIAL  4.  572ft  ambulation with LRAD Baseline: 101ft with RW Goal status: INITIAL  5.  Increase FOTO score to 61 Baseline: 55 Goal status: INITIAL  6.  Increase reps to 6 on 30s chair stand test Baseline: 1 rep Goal status: INITIAL   PLAN:  PT FREQUENCY: 1-2x/week  PT DURATION: 8 weeks  PLANNED INTERVENTIONS: Therapeutic exercises, Therapeutic activity, Neuromuscular re-education, Balance training, Gait training, Patient/Family education, Self Care, Joint mobilization, Stair training, DME instructions, Dry Needling, Electrical stimulation, Cryotherapy, Moist heat, scar mobilization, Manual therapy, and Re-evaluation  PLAN FOR NEXT SESSION: HEP review and update, manual techniques as appropriate, aerobic tasks, ROM and flexibility activities, strengthening and PREs, TPDN, gait and balance training as needed    Berta Minor PTA 03/18/23  9:10 AM Phone: [phone] Fax: [phone]

## 2023-03-18 ENCOUNTER — Ambulatory Visit: Payer: Medicaid Other

## 2023-03-18 DIAGNOSIS — M25662 Stiffness of left knee, not elsewhere classified: Secondary | ICD-10-CM

## 2023-03-18 DIAGNOSIS — R2689 Other abnormalities of gait and mobility: Secondary | ICD-10-CM

## 2023-03-18 DIAGNOSIS — R600 Localized edema: Secondary | ICD-10-CM

## 2023-03-18 DIAGNOSIS — M25562 Pain in left knee: Secondary | ICD-10-CM

## 2023-03-18 DIAGNOSIS — M6281 Muscle weakness (generalized): Secondary | ICD-10-CM

## 2023-03-19 ENCOUNTER — Ambulatory Visit (INDEPENDENT_AMBULATORY_CARE_PROVIDER_SITE_OTHER): Payer: Medicaid Other | Admitting: Orthopedic Surgery

## 2023-03-19 ENCOUNTER — Encounter: Payer: Self-pay | Admitting: Orthopedic Surgery

## 2023-03-19 DIAGNOSIS — Z96652 Presence of left artificial knee joint: Secondary | ICD-10-CM

## 2023-03-19 NOTE — Progress Notes (Signed)
Post-Op Visit Note   Patient: Christine Barrett           Date of Birth: [birth year]           MRN: [id number] Visit Date: 03/19/2023 PCP: Excell Seltzer, MD   Assessment & Plan:  Chief Complaint:  Chief Complaint  Patient presents with   Left Knee - Routine Post Op   Visit Diagnoses:  1. Status post total knee replacement, left     Plan: Christine Barrett is a 62 year old patient who is now 6 weeks out left total knee replacement.  Ambulating with a cane.  Doing reasonably well going to physical therapy.  She states physical therapy is painful but it is helping.  She feels like the left knee has been easier than the right knee.  She is doing biking as well as quad strengthening.  PT reported range of motion 0-94.  Not taking any pain meds.  Does take occasional Robaxin.  It is still hard for her to get a good night sleep.  On examination she does have no effusion in the knee with good patella mobility and range of motion fairly easily to 90 but is little tough to get it past that.  Plan at this time is to really focus most of her energy on working on achieving flexion to get it past to 100 degrees.  I think that would make it easier for her to go up and down stairs.  Overall she is progressing well and putting in a very good effort in therapy as well as a home exercise program.  Follow-up in 6 weeks final check.  Follow-Up Instructions: No follow-ups on file.   Orders:  No orders of the defined types were placed in this encounter.  No orders of the defined types were placed in this encounter.   Imaging: No results found.  PMFS History: Patient Active Problem List   Diagnosis Date Noted   Arthritis of left knee 02/14/2023   OA (osteoarthritis) of knee 02/02/2023   Status post total knee replacement, left 02/02/2023   Coronary artery disease involving native heart with angina pectoris, unspecified vessel or lesion type (HCC) 10/29/2021   Perineal irritation in female 08/15/2021   Dysuria  08/15/2021   Plantar fasciitis of right foot 07/04/2021   Trochanteric bursitis of left hip 04/08/2021   Pain of left middle finger 04/08/2021   Other allergic rhinitis 01/01/2021   Frequent episodes of sinusitis 01/01/2021   History of bronchitis 01/01/2021   Allergic conjunctivitis of both eyes 01/01/2021   Family history of breast cancer in sister 09/20/2020   Type 2 diabetes mellitus with other circulatory complications, HTN, CVD (HCC) 05/17/2020   Morbid obesity (HCC) 05/17/2020   BMI 40.0-44.9, adult (HCC) 05/17/2020   Acute non-recurrent maxillary sinusitis 11/04/2016   Acute pain of both knees 08/28/2016   OSA (obstructive sleep apnea) 06/10/2016   Costochondritis 06/09/2016   Bilateral leg cramps 02/07/2016   Peripheral edema 01/29/2016   Hypertension associated with diabetes (HCC)    GERD (gastroesophageal reflux disease) 08/10/2014   Vaginal dryness, menopausal 08/10/2014   CAD S/P percutaneous coronary angioplasty - Ostial AVG Cx - Scoring balloon PTCA 10/03/2013   Hyperlipidemia with target LDL less than 70 10/02/2013   H/O non-ST elevation myocardial infarction (NSTEMI) 10/02/2013   Normocytic anemia, not due to blood loss    Allergic sinusitis 03/17/2007   MIGRAINE, COMMON W/O INTRACTABLE MIGRAINE 03/16/2007   Past Medical History:  Diagnosis Date   Allergy  SEASONAL   Anemia    Arthritis    CAD S/P percutaneous coronary angioplasty 09/2013   a) Ostial AV G Cx - 2.5 mm Angiosculpt; mid LAD 40-60%; b) Myoview 07/2014: LOW RISK, small-severe fixed inferior defect c/w infarct w/o peri-infarct ischemia.   Chronic bronchitis (HCC)    "frequently; not q yr" (10/03/2013)   Diabetes mellitus without complication (HCC)    Type 2-diet controlled.    Diverticulosis    Essential hypertension    with prior Accelerated HTN   GERD (gastroesophageal reflux disease)    Hiatal hernia    Hx of non-ST elevation myocardial infarction (NSTEMI) 10/01/2013   Due to Accelerated HTN  with existing CAD   Hyperlipemia    Migraine    "@ least once/month" (10/03/2013)   Myocardial infarction (HCC)    OSA (obstructive sleep apnea) 06/10/2016   Schatzki's ring    Seasonal allergies    Seizure (HCC)    1 in elementary school, never knew what caused it   Sinusitis    Spinal headache    during C-section of second child.     Family History  Adopted: Yes  Problem Relation Age of Onset   Allergic rhinitis Mother    Diabetes Mother    Sinusitis Mother    Breast cancer Sister    Hypertension Sister    Colon cancer Neg Hx    Heart attack Neg Hx    Stroke Neg Hx     Past Surgical History:  Procedure Laterality Date   ABDOMINAL HYSTERECTOMY  1994   "partial"   CAPSULAR RELEASE Right 06/06/2020   Procedure: RIGHT SHOULDER MANIPULATION UNDER ANESTHESIA, ROTATOR CUFF REPAIR;  Surgeon: Cammy Copa, MD;  Location: [facility] OR;  Service: Orthopedics;  Laterality: Right;   CARDIAC CATHETERIZATION N/A 08/05/2015   Procedure: Left Heart Cath and Coronary Angiography;  Surgeon: Marykay Lex, MD;  Location: [facility] INVASIVE CV LAB;  Service: Cardiovascular;  Laterality: N/A;   CESAREAN SECTION  1989   COLONOSCOPY     CORONARY ANGIOPLASTY  10/03/2013   95% ostial AV G Cx - 2.5 mm AngioSculpt Balloon PTCA; mid LAD 40-60%   LEFT HEART CATHETERIZATION WITH CORONARY ANGIOGRAM N/A 10/02/2013   Procedure: LEFT HEART CATHETERIZATION WITH CORONARY ANGIOGRAM;  Surgeon: Lennette Bihari, MD;  Location: [facility] CATH LAB;  Service: Cardiovascular;  Laterality: N/A;   NASAL SEPTOPLASTY W/ TURBINOPLASTY  ~ 2007   NM MYOVIEW LTD  08/22/2014    Low risk stress nuclear study with a small, severe, fixed defect in the distal inferior wall/apex suggestive of small prior infarct; no ischemia.  LV Wall Motion:  NL LV Function; NL Wall Motion   PERCUTANEOUS CORONARY STENT INTERVENTION (PCI-S) N/A 10/03/2013   cutting balloon angioplasty only no stent.    SINOSCOPY     TOTAL KNEE ARTHROPLASTY Left 02/02/2023    Procedure: LEFT TOTAL KNEE ARTHROPLASTY;  Surgeon: Cammy Copa, MD;  Location: [facility] OR;  Service: Orthopedics;  Laterality: Left;   TRANSTHORACIC ECHOCARDIOGRAM  10/02/2013   EF 55-60%; mild LVH, no RWMA,    TUBAL LIGATION  1989   Social History   Occupational History   Occupation: Biomedical engineer  Tobacco Use   Smoking status: Never    Passive exposure: Never   Smokeless tobacco: Never  Vaping Use   Vaping status: Never Used  Substance and Sexual Activity   Alcohol use: Not Currently    Comment: occ   Drug use: No   Sexual activity: Yes  Birth control/protection: Surgical

## 2023-03-22 ENCOUNTER — Telehealth: Payer: Self-pay

## 2023-03-22 NOTE — Telephone Encounter (Signed)
-----   Message from Burnard Bunting sent at 03/19/2023  6:26 PM EDT ----- Cresenciano Lick please follow-up Franky Macho 6 weeks thanks

## 2023-03-22 NOTE — Telephone Encounter (Signed)
Please schedule 6wk f/u with Franky Macho, per Dr August Saucer

## 2023-03-23 ENCOUNTER — Encounter: Payer: Self-pay | Admitting: Physical Therapy

## 2023-03-23 ENCOUNTER — Other Ambulatory Visit: Payer: Self-pay

## 2023-03-23 ENCOUNTER — Ambulatory Visit: Payer: Medicaid Other | Admitting: Physical Therapy

## 2023-03-23 DIAGNOSIS — M6281 Muscle weakness (generalized): Secondary | ICD-10-CM

## 2023-03-23 DIAGNOSIS — R600 Localized edema: Secondary | ICD-10-CM

## 2023-03-23 DIAGNOSIS — R2689 Other abnormalities of gait and mobility: Secondary | ICD-10-CM

## 2023-03-23 DIAGNOSIS — M25562 Pain in left knee: Secondary | ICD-10-CM

## 2023-03-23 NOTE — Therapy (Signed)
OUTPATIENT PHYSICAL THERAPY TREATMENT   Patient Name: Christine Barrett MRN: [id number] DOB:31-Jan-1961, 62 y.o., female Today's Date: 03/23/2023   END OF SESSION:  PT End of Session - 03/23/23 0930     Visit Number 7    Number of Visits 16    Date for PT Re-Evaluation 04/28/23    Authorization Type [employer]    PT Start Time 0845    PT Stop Time 0940    PT Time Calculation (min) 55 min    Activity Tolerance Patient tolerated treatment well    Behavior During Therapy [facility] for tasks assessed/performed                Past Medical History:  Diagnosis Date   Allergy    SEASONAL   Anemia    Arthritis    CAD S/P percutaneous coronary angioplasty 09/2013   a) Ostial AV G Cx - 2.5 mm Angiosculpt; mid LAD 40-60%; b) Myoview 07/2014: LOW RISK, small-severe fixed inferior defect c/w infarct w/o peri-infarct ischemia.   Chronic bronchitis (HCC)    "frequently; not q yr" (10/03/2013)   Diabetes mellitus without complication (HCC)    Type 2-diet controlled.    Diverticulosis    Essential hypertension    with prior Accelerated HTN   GERD (gastroesophageal reflux disease)    Hiatal hernia    Hx of non-ST elevation myocardial infarction (NSTEMI) 10/01/2013   Due to Accelerated HTN with existing CAD   Hyperlipemia    Migraine    "@ least once/month" (10/03/2013)   Myocardial infarction (HCC)    OSA (obstructive sleep apnea) 06/10/2016   Schatzki's ring    Seasonal allergies    Seizure (HCC)    1 in elementary school, never knew what caused it   Sinusitis    Spinal headache    during C-section of second child.    Past Surgical History:  Procedure Laterality Date   ABDOMINAL HYSTERECTOMY  1994   "partial"   CAPSULAR RELEASE Right 06/06/2020   Procedure: RIGHT SHOULDER MANIPULATION UNDER ANESTHESIA, ROTATOR CUFF REPAIR;  Surgeon: Cammy Copa, MD;  Location: [facility] OR;  Service: Orthopedics;  Laterality: Right;   CARDIAC CATHETERIZATION N/A 08/05/2015   Procedure: Left Heart Cath  and Coronary Angiography;  Surgeon: Marykay Lex, MD;  Location: [facility] INVASIVE CV LAB;  Service: Cardiovascular;  Laterality: N/A;   CESAREAN SECTION  1989   COLONOSCOPY     CORONARY ANGIOPLASTY  10/03/2013   95% ostial AV G Cx - 2.5 mm AngioSculpt Balloon PTCA; mid LAD 40-60%   LEFT HEART CATHETERIZATION WITH CORONARY ANGIOGRAM N/A 10/02/2013   Procedure: LEFT HEART CATHETERIZATION WITH CORONARY ANGIOGRAM;  Surgeon: Lennette Bihari, MD;  Location: [facility] CATH LAB;  Service: Cardiovascular;  Laterality: N/A;   NASAL SEPTOPLASTY W/ TURBINOPLASTY  ~ 2007   NM MYOVIEW LTD  08/22/2014    Low risk stress nuclear study with a small, severe, fixed defect in the distal inferior wall/apex suggestive of small prior infarct; no ischemia.  LV Wall Motion:  NL LV Function; NL Wall Motion   PERCUTANEOUS CORONARY STENT INTERVENTION (PCI-S) N/A 10/03/2013   cutting balloon angioplasty only no stent.    SINOSCOPY     TOTAL KNEE ARTHROPLASTY Left 02/02/2023   Procedure: LEFT TOTAL KNEE ARTHROPLASTY;  Surgeon: Cammy Copa, MD;  Location: [facility] OR;  Service: Orthopedics;  Laterality: Left;   TRANSTHORACIC ECHOCARDIOGRAM  10/02/2013   EF 55-60%; mild LVH, no RWMA,    TUBAL LIGATION  1989  Patient Active Problem List   Diagnosis Date Noted   Arthritis of left knee 02/14/2023   OA (osteoarthritis) of knee 02/02/2023   Status post total knee replacement, left 02/02/2023   Coronary artery disease involving native heart with angina pectoris, unspecified vessel or lesion type (HCC) 10/29/2021   Perineal irritation in female 08/15/2021   Dysuria 08/15/2021   Plantar fasciitis of right foot 07/04/2021   Trochanteric bursitis of left hip 04/08/2021   Pain of left middle finger 04/08/2021   Other allergic rhinitis 01/01/2021   Frequent episodes of sinusitis 01/01/2021   History of bronchitis 01/01/2021   Allergic conjunctivitis of both eyes 01/01/2021   Family history of breast cancer in sister 09/20/2020   Type  2 diabetes mellitus with other circulatory complications, HTN, CVD (HCC) 05/17/2020   Morbid obesity (HCC) 05/17/2020   BMI 40.0-44.9, adult (HCC) 05/17/2020   Acute non-recurrent maxillary sinusitis 11/04/2016   Acute pain of both knees 08/28/2016   OSA (obstructive sleep apnea) 06/10/2016   Costochondritis 06/09/2016   Bilateral leg cramps 02/07/2016   Peripheral edema 01/29/2016   Hypertension associated with diabetes (HCC)    GERD (gastroesophageal reflux disease) 08/10/2014   Vaginal dryness, menopausal 08/10/2014   CAD S/P percutaneous coronary angioplasty - Ostial AVG Cx - Scoring balloon PTCA 10/03/2013   Hyperlipidemia with target LDL less than 70 10/02/2013   H/O non-ST elevation myocardial infarction (NSTEMI) 10/02/2013   Normocytic anemia, not due to blood loss    Allergic sinusitis 03/17/2007   MIGRAINE, COMMON W/O INTRACTABLE MIGRAINE 03/16/2007    PCP: Excell Seltzer, MD   REFERRING PROVIDER: Julieanne Cotton, PA-C  REFERRING DIAG: [phone] (ICD-10-CM) - Status post total knee replacement, left  THERAPY DIAG:  Acute pain of left knee  Muscle weakness (generalized)  Other abnormalities of gait and mobility  Localized edema  Rationale for Evaluation and Treatment: Rehabilitation  ONSET DATE: 02/02/23 DOS   SUBJECTIVE:  SUBJECTIVE STATEMENT:  Patient reports continued left knee pain.   PERTINENT HISTORY: See PMH above  PAIN:  Are you having pain? Yes:  NPRS scale: 7/10 Pain location: Left knee Pain description: Sore, stiff Aggravating factors: ROM, activity Relieving factors: Rest, ice, heat  PRECAUTIONS: Knee  PATIENT GOALS: To regain the function in my L knee   OBJECTIVE:  PATIENT SURVEYS:   FOTO: 55 (61 predicted)   03/23/2023: 47% functional status  MUSCLE LENGTH: deferred  LOWER EXTREMITY ROM:  A/PROM Right eval Left eval Left 03/05/2023 Left 03/09/23 Left 03/18/23  Hip flexion       Hip extension       Hip abduction       Hip  adduction       Hip internal rotation       Hip external rotation       Knee flexion  75/80d 85 AROM / 90 PROM 87 PROM 94 AAROM strap  Knee extension  -25/-10d -6 AROM -5 AROM -5 AROM  Ankle dorsiflexion       Ankle plantarflexion       Ankle inversion       Ankle eversion        (Blank rows = not tested)  LOWER EXTREMITY MMT:  MMT Right eval Left eval  Hip flexion    Hip extension    Hip abduction    Hip adduction    Hip internal rotation    Hip external rotation    Knee flexion  3  Knee extension  3-  Ankle  dorsiflexion    Ankle plantarflexion    Ankle inversion    Ankle eversion     (Blank rows = not tested)  FUNCTIONAL TESTS:  30s chair stand 1 rep  GAIT: Distance walked: 74ft x2 Assistive device utilized: Environmental consultant - 2 wheeled Level of assistance: Modified independence Comments: slow cadence with step through pattern   TODAY'S TREATMENT:  [facility] Adult PT Treatment:                                                DATE: 03/23/23 Therapeutic Exercise: Nustep L5 x 5 min with UE/LE to work on knee motion Seated hamstring stretch 3 x 20 sec Slant board calf stretch 3 x 20 sec Calf stretch at counter 2 x 20 sec Knee flexion stretch with foot on 8" step 10 x 10 sec Manual: Seated knee joint mobs and knee flexion PROM Supine patellar mobs all directions Supine knee extension and flexion PROM Supine passive hamstring stretch with manual pressure over knee Modalities: Vasopneumatic (Game Ready)   Location:  Left knee Time:  15 minutes Pressure:  Medium Temperature:  34 degrees   [facility] Adult PT Treatment:                                                DATE: 03/17/23 Therapeutic Exercise: Nustep L2 8 min focus on ROM - moving forward halfway through Standing knee flexion stretch on 6" step x20 SLR partial range x 5 SAQ 15x Heel slides w/strap over 1/2 foam roll 15x 94d TKE in standing GTB 15x Modalities: Vasopneumatic (Game Ready)   Location:  left knee Time:   10 minutes Pressure:  low Temperature:  34 degrees  [facility] Adult PT Treatment:                                                DATE: 03/16/23 Therapeutic Exercise: Nustep L2 8 min focus on ROM - moving forward halfway through SLR partial range 2 x 5 SAQ 15x2 Quad sets 3s 15x Heel slides w/strap 15x 92d TKE in standing GTB 15x STS - LLE back RLE fwd x10 Modalities: Vasopneumatic (Game Ready)   Location:  left knee Time:  10 minutes Pressure:  low Temperature:  34 degrees  PATIENT EDUCATION:  Education details: HEP update Person educated: Patient Education method: Explanation, Handout Education comprehension: verbalized understanding and needs further education  HOME EXERCISE PROGRAM: Access Code: [email or web address]   ASSESSMENT: CLINICAL IMPRESSION:  Patient tolerated therapy well with no adverse effects. Therapy focused primarily on progressing left knee motion with fair tolerance. She did become tearful during left knee PROM stating she was just ready for this to be over because her knee was painful. A this time discontinued manual therapy and focused on gentle stretching for the knee and encouraged patient to perform stretching through comfortable range. Updated her HEP with new stretches and concluded therapy with vaso for the left knee for pain management. Patient would benefit from continued skilled PT to progress her mobility and strength in order to maximize functional ability.     OBJECTIVE IMPAIRMENTS: Abnormal gait,  decreased activity tolerance, decreased coordination, decreased endurance, decreased knowledge of condition, decreased knowledge of use of DME, decreased mobility, difficulty walking, decreased ROM, decreased strength, improper body mechanics, and pain.   ACTIVITY LIMITATIONS: carrying, lifting, standing, squatting, and stairs  PERSONAL FACTORS: Age, Fitness, Past/current experiences, and 1 comorbidity: DM  are also affecting patient's functional outcome.     GOALS: Goals reviewed with patient? No  SHORT TERM GOALS: Target date: 03/26/2023   Patient to demonstrate independence in HEP  Baseline: [email or web address] Goal status: Ongoing  2.  Increase L knee AROM to 90d flexion and -10d extension Baseline:  A/PROM Right eval Left eval  Hip flexion    Hip extension    Hip abduction    Hip adduction    Hip internal rotation    Hip external rotation    Knee flexion  75/80d  Knee extension  -25/-10d   Goal status: Progressing  3.  Decrease pain to 4/10 Baseline: 8/10 Goal status: Ongoing  LONG TERM GOALS: Target date: 04/23/2023   Able to ascend 16 steps with LRAD and most appropriate pattern Baseline: 4 steps with B rails and step to pattern Goal status: INITIAL  2.  Increases L knee ROM to -5d extension and 120d flexion Baseline:  A/PROM Right eval Left eval  Hip flexion    Hip extension    Hip abduction    Hip adduction    Hip internal rotation    Hip external rotation    Knee flexion  75/80d  Knee extension  -25/-10d   Goal status: INITIAL  3.  Increase L knee strength to 4/5 Baseline:  MMT Right eval Left eval  Hip flexion    Hip extension    Hip abduction    Hip adduction    Hip internal rotation    Hip external rotation    Knee flexion  3  Knee extension  3-   Goal status: INITIAL  4.  515ft ambulation with LRAD Baseline: 50ft with RW Goal status: INITIAL  5.  Increase FOTO score to 61 Baseline: 55 03/23/2023: 47% functional status Goal status: ONGOING  6.  Increase reps to 6 on 30s chair stand test Baseline: 1 rep Goal status: INITIAL   PLAN: PT FREQUENCY: 1-2x/week  PT DURATION: 8 weeks  PLANNED INTERVENTIONS: Therapeutic exercises, Therapeutic activity, Neuromuscular re-education, Balance training, Gait training, Patient/Family education, Self Care, Joint mobilization, Stair training, DME instructions, Dry Needling, Electrical stimulation, Cryotherapy, Moist heat, scar mobilization, Manual  therapy, and Re-evaluation  PLAN FOR NEXT SESSION: HEP review and update, manual techniques as appropriate, aerobic tasks, ROM and flexibility activities, strengthening and PREs, TPDN, gait and balance training as needed    Rosana Hoes, PT, DPT, LAT, ATC 03/23/23  10:16 AM Phone: [phone] Fax: [phone]

## 2023-03-23 NOTE — Patient Instructions (Signed)
Access Code: [email or web address] URL: [email or web address] Date: 03/23/2023 Prepared by: Rosana Hoes  Exercises - Seated Heel Slide  - 5 x daily - 5 x weekly - 1 sets - 10 reps - Long Sitting Quad Set with Towel Roll Under Heel  - 5 x daily - 5 x weekly - 1 sets - 10 reps - 3s hold - Supine Heel Slide with Strap  - 5 x daily - 5 x weekly - 1 sets - 10 reps - Small Range Straight Leg Raise  - 5 x daily - 5 x weekly - 1 sets - 10 reps - Seated Hamstring Stretch  - 2-3 x daily - 3 reps - 20 seconds hold - Standing Gastroc Stretch at Counter  - 2-3 x daily - 3 reps - 20 seconds hold - Standing Knee Flexion Stretch on Step  - 2-3 x daily - 10 reps - 10 seconds hold

## 2023-03-26 ENCOUNTER — Encounter: Payer: Self-pay | Admitting: Physical Therapy

## 2023-03-26 ENCOUNTER — Other Ambulatory Visit: Payer: Self-pay

## 2023-03-26 ENCOUNTER — Ambulatory Visit: Payer: Medicaid Other | Admitting: Physical Therapy

## 2023-03-26 DIAGNOSIS — M25562 Pain in left knee: Secondary | ICD-10-CM | POA: Diagnosis not present

## 2023-03-26 DIAGNOSIS — R2689 Other abnormalities of gait and mobility: Secondary | ICD-10-CM

## 2023-03-26 DIAGNOSIS — M6281 Muscle weakness (generalized): Secondary | ICD-10-CM

## 2023-03-26 DIAGNOSIS — R600 Localized edema: Secondary | ICD-10-CM

## 2023-03-26 NOTE — Therapy (Signed)
OUTPATIENT PHYSICAL THERAPY TREATMENT   Patient Name: Christine Barrett MRN: [id number] DOB:[birth year], 62 y.o., female Today's Date: 03/26/2023   END OF SESSION:  PT End of Session - 03/26/23 0828     Visit Number 8    Number of Visits 16    Date for PT Re-Evaluation 04/28/23    Authorization Type [employer]    PT Start Time 0800    PT Stop Time 0900    PT Time Calculation (min) 60 min    Activity Tolerance Patient tolerated treatment well    Behavior During Therapy [facility] for tasks assessed/performed                 Past Medical History:  Diagnosis Date   Allergy    SEASONAL   Anemia    Arthritis    CAD S/P percutaneous coronary angioplasty 09/2013   a) Ostial AV G Cx - 2.5 mm Angiosculpt; mid LAD 40-60%; b) Myoview 07/2014: LOW RISK, small-severe fixed inferior defect c/w infarct w/o peri-infarct ischemia.   Chronic bronchitis (HCC)    "frequently; not q yr" (10/03/2013)   Diabetes mellitus without complication (HCC)    Type 2-diet controlled.    Diverticulosis    Essential hypertension    with prior Accelerated HTN   GERD (gastroesophageal reflux disease)    Hiatal hernia    Hx of non-ST elevation myocardial infarction (NSTEMI) 10/01/2013   Due to Accelerated HTN with existing CAD   Hyperlipemia    Migraine    "@ least once/month" (10/03/2013)   Myocardial infarction (HCC)    OSA (obstructive sleep apnea) 06/10/2016   Schatzki's ring    Seasonal allergies    Seizure (HCC)    1 in elementary school, never knew what caused it   Sinusitis    Spinal headache    during C-section of second child.    Past Surgical History:  Procedure Laterality Date   ABDOMINAL HYSTERECTOMY  1994   "partial"   CAPSULAR RELEASE Right 06/06/2020   Procedure: RIGHT SHOULDER MANIPULATION UNDER ANESTHESIA, ROTATOR CUFF REPAIR;  Surgeon: Cammy Copa, MD;  Location: [facility] OR;  Service: Orthopedics;  Laterality: Right;   CARDIAC CATHETERIZATION N/A 08/05/2015   Procedure: Left Heart  Cath and Coronary Angiography;  Surgeon: Marykay Lex, MD;  Location: [facility] INVASIVE CV LAB;  Service: Cardiovascular;  Laterality: N/A;   CESAREAN SECTION  1989   COLONOSCOPY     CORONARY ANGIOPLASTY  10/03/2013   95% ostial AV G Cx - 2.5 mm AngioSculpt Balloon PTCA; mid LAD 40-60%   LEFT HEART CATHETERIZATION WITH CORONARY ANGIOGRAM N/A 10/02/2013   Procedure: LEFT HEART CATHETERIZATION WITH CORONARY ANGIOGRAM;  Surgeon: Lennette Bihari, MD;  Location: [facility] CATH LAB;  Service: Cardiovascular;  Laterality: N/A;   NASAL SEPTOPLASTY W/ TURBINOPLASTY  ~ 2007   NM MYOVIEW LTD  08/22/2014    Low risk stress nuclear study with a small, severe, fixed defect in the distal inferior wall/apex suggestive of small prior infarct; no ischemia.  LV Wall Motion:  NL LV Function; NL Wall Motion   PERCUTANEOUS CORONARY STENT INTERVENTION (PCI-S) N/A 10/03/2013   cutting balloon angioplasty only no stent.    SINOSCOPY     TOTAL KNEE ARTHROPLASTY Left 02/02/2023   Procedure: LEFT TOTAL KNEE ARTHROPLASTY;  Surgeon: Cammy Copa, MD;  Location: [facility] OR;  Service: Orthopedics;  Laterality: Left;   TRANSTHORACIC ECHOCARDIOGRAM  10/02/2013   EF 55-60%; mild LVH, no RWMA,    TUBAL LIGATION  1989  Patient Active Problem List   Diagnosis Date Noted   Arthritis of left knee 02/14/2023   OA (osteoarthritis) of knee 02/02/2023   Status post total knee replacement, left 02/02/2023   Coronary artery disease involving native heart with angina pectoris, unspecified vessel or lesion type (HCC) 10/29/2021   Perineal irritation in female 08/15/2021   Dysuria 08/15/2021   Plantar fasciitis of right foot 07/04/2021   Trochanteric bursitis of left hip 04/08/2021   Pain of left middle finger 04/08/2021   Other allergic rhinitis 01/01/2021   Frequent episodes of sinusitis 01/01/2021   History of bronchitis 01/01/2021   Allergic conjunctivitis of both eyes 01/01/2021   Family history of breast cancer in sister 09/20/2020    Type 2 diabetes mellitus with other circulatory complications, HTN, CVD (HCC) 05/17/2020   Morbid obesity (HCC) 05/17/2020   BMI 40.0-44.9, adult (HCC) 05/17/2020   Acute non-recurrent maxillary sinusitis 11/04/2016   Acute pain of both knees 08/28/2016   OSA (obstructive sleep apnea) 06/10/2016   Costochondritis 06/09/2016   Bilateral leg cramps 02/07/2016   Peripheral edema 01/29/2016   Hypertension associated with diabetes (HCC)    GERD (gastroesophageal reflux disease) 08/10/2014   Vaginal dryness, menopausal 08/10/2014   CAD S/P percutaneous coronary angioplasty - Ostial AVG Cx - Scoring balloon PTCA 10/03/2013   Hyperlipidemia with target LDL less than 70 10/02/2013   H/O non-ST elevation myocardial infarction (NSTEMI) 10/02/2013   Normocytic anemia, not due to blood loss    Allergic sinusitis 03/17/2007   MIGRAINE, COMMON W/O INTRACTABLE MIGRAINE 03/16/2007    PCP: Excell Seltzer, MD   REFERRING PROVIDER: Julieanne Cotton, PA-C  REFERRING DIAG: [phone] (ICD-10-CM) - Status post total knee replacement, left  THERAPY DIAG:  Acute pain of left knee  Muscle weakness (generalized)  Other abnormalities of gait and mobility  Localized edema  Rationale for Evaluation and Treatment: Rehabilitation  ONSET DATE: 02/02/23 DOS   SUBJECTIVE:  SUBJECTIVE STATEMENT:  Patient reports continued left knee pain.   PERTINENT HISTORY: See PMH above  PAIN:  Are you having pain? Yes:  NPRS scale: 7/10 Pain location: Left knee Pain description: Sore, stiff Aggravating factors: ROM, activity Relieving factors: Rest, ice, heat  PRECAUTIONS: Knee  PATIENT GOALS: To regain the function in my L knee   OBJECTIVE:  PATIENT SURVEYS:   FOTO: 55 (61 predicted)   03/23/2023: 47% functional status  MUSCLE LENGTH: deferred  LOWER EXTREMITY ROM:  A/PROM Right eval Left eval Left 03/05/2023 Left 03/09/23 Left 03/18/23 Left 03/26/2023  Hip flexion        Hip extension         Hip abduction        Hip adduction        Hip internal rotation        Hip external rotation        Knee flexion  75/80d 85 AROM / 90 PROM 87 PROM 94 AAROM strap 97 AAROM  Knee extension  -25/-10d -6 AROM -5 AROM -5 AROM Lacking 3  Ankle dorsiflexion        Ankle plantarflexion        Ankle inversion        Ankle eversion         (Blank rows = not tested)  LOWER EXTREMITY MMT:  MMT Right eval Left eval  Hip flexion    Hip extension    Hip abduction    Hip adduction    Hip internal rotation    Hip  external rotation    Knee flexion  3  Knee extension  3-  Ankle dorsiflexion    Ankle plantarflexion    Ankle inversion    Ankle eversion     (Blank rows = not tested)  FUNCTIONAL TESTS:  30s chair stand 1 rep  GAIT: Distance walked: 17ft x2 Assistive device utilized: Environmental consultant - 2 wheeled Level of assistance: Modified independence Comments: slow cadence with step through pattern   TODAY'S TREATMENT:  [facility] Adult PT Treatment:                                                DATE: 03/26/23 Therapeutic Exercise: Recumbent bike partial revolutions x 6 min to work on knee flexion Modified thomas stretch with passive knee flexion 3 x 30 sec Supine heel slide with strap 10 x 5 sec Seated hamstring stretch 3 x 20 sec Slant board calf stretch 3 x 20 sec Knee flexion stretch with foot on 10" step 5 x 10 sec Squat with BUE support at FM bar x 10 Manual: Seated knee joint mobs and knee flexion PROM Supine patellar mobs all directions Supine knee extension and flexion PROM Modalities: Vasopneumatic (Game Ready)   Location:  Left knee Time:  15 minutes Pressure:  Medium Temperature:  34 degrees   [facility] Adult PT Treatment:                                                DATE: 03/23/23 Therapeutic Exercise: Nustep L5 x 5 min with UE/LE to work on knee motion Seated hamstring stretch 3 x 20 sec Slant board calf stretch 3 x 20 sec Calf stretch at counter 2 x 20 sec Knee flexion  stretch with foot on 8" step 10 x 10 sec Manual: Seated knee joint mobs and knee flexion PROM Supine patellar mobs all directions Supine knee extension and flexion PROM Supine passive hamstring stretch with manual pressure over knee Modalities: Vasopneumatic (Game Ready)   Location:  Left knee Time:  15 minutes Pressure:  Medium Temperature:  34 degrees  [facility] Adult PT Treatment:                                                DATE: 03/17/23 Therapeutic Exercise: Nustep L2 8 min focus on ROM - moving forward halfway through Standing knee flexion stretch on 6" step x20 SLR partial range x 5 SAQ 15x Heel slides w/strap over 1/2 foam roll 15x 94d TKE in standing GTB 15x Modalities: Vasopneumatic (Game Ready)   Location:  left knee Time:  10 minutes Pressure:  low Temperature:  34 degrees  [facility] Adult PT Treatment:                                                DATE: 03/16/23 Therapeutic Exercise: Nustep L2 8 min focus on ROM - moving forward halfway through SLR partial range 2 x 5 SAQ 15x2 Quad sets 3s 15x Heel slides w/strap  15x 92d TKE in standing GTB 15x STS - LLE back RLE fwd x10 Modalities: Vasopneumatic (Game Ready)   Location:  left knee Time:  10 minutes Pressure:  low Temperature:  34 degrees  PATIENT EDUCATION:  Education details: HEP Person educated: Patient Education method: Explanation Education comprehension: verbalized understanding and needs further education  HOME EXERCISE PROGRAM: Access Code: [email or web address]   ASSESSMENT: CLINICAL IMPRESSION:  Patient tolerated therapy well with no adverse effects. Therapy focused on improving knee motion and gradual progression of strengthening with better tolerance this visit. She does exhibit slight improvement in her knee motion this visit. No changes made to her HEP. Concluded therapy with vaso for the left knee for pain management. Patient would benefit from continued skilled PT to progress her mobility and strength  in order to maximize functional ability.     OBJECTIVE IMPAIRMENTS: Abnormal gait, decreased activity tolerance, decreased coordination, decreased endurance, decreased knowledge of condition, decreased knowledge of use of DME, decreased mobility, difficulty walking, decreased ROM, decreased strength, improper body mechanics, and pain.   ACTIVITY LIMITATIONS: carrying, lifting, standing, squatting, and stairs  PERSONAL FACTORS: Age, Fitness, Past/current experiences, and 1 comorbidity: DM  are also affecting patient's functional outcome.    GOALS: Goals reviewed with patient? No  SHORT TERM GOALS: Target date: 03/26/2023   Patient to demonstrate independence in HEP  Baseline: [email or web address] Goal status: Ongoing  2.  Increase L knee AROM to 90d flexion and -10d extension Baseline:  A/PROM Right eval Left eval  Hip flexion    Hip extension    Hip abduction    Hip adduction    Hip internal rotation    Hip external rotation    Knee flexion  75/80d  Knee extension  -25/-10d   03/26/2023: see above Goal status: MET  3.  Decrease pain to 4/10 Baseline: 8/10 Goal status: Ongoing  LONG TERM GOALS: Target date: 04/23/2023   Able to ascend 16 steps with LRAD and most appropriate pattern Baseline: 4 steps with B rails and step to pattern Goal status: INITIAL  2.  Increases L knee ROM to -5d extension and 120d flexion Baseline:  A/PROM Right eval Left eval  Hip flexion    Hip extension    Hip abduction    Hip adduction    Hip internal rotation    Hip external rotation    Knee flexion  75/80d  Knee extension  -25/-10d   Goal status: INITIAL  3.  Increase L knee strength to 4/5 Baseline:  MMT Right eval Left eval  Hip flexion    Hip extension    Hip abduction    Hip adduction    Hip internal rotation    Hip external rotation    Knee flexion  3  Knee extension  3-   Goal status: INITIAL  4.  573ft ambulation with LRAD Baseline: 15ft with RW Goal status:  INITIAL  5.  Increase FOTO score to 61 Baseline: 55 03/23/2023: 47% functional status Goal status: ONGOING  6.  Increase reps to 6 on 30s chair stand test Baseline: 1 rep Goal status: INITIAL   PLAN: PT FREQUENCY: 1-2x/week  PT DURATION: 8 weeks  PLANNED INTERVENTIONS: Therapeutic exercises, Therapeutic activity, Neuromuscular re-education, Balance training, Gait training, Patient/Family education, Self Care, Joint mobilization, Stair training, DME instructions, Dry Needling, Electrical stimulation, Cryotherapy, Moist heat, scar mobilization, Manual therapy, and Re-evaluation  PLAN FOR NEXT SESSION: HEP review and update, manual techniques as appropriate, aerobic tasks, ROM and flexibility activities, strengthening and  PREs, TPDN, gait and balance training as needed    Rosana Hoes, PT, DPT, LAT, ATC 03/26/23  8:46 AM Phone: [phone] Fax: [phone]

## 2023-03-30 ENCOUNTER — Encounter: Payer: Self-pay | Admitting: Family Medicine

## 2023-03-30 ENCOUNTER — Ambulatory Visit: Payer: Self-pay

## 2023-03-30 NOTE — Telephone Encounter (Signed)
PA request sent for [facility]

## 2023-03-30 NOTE — Patient Outreach (Signed)
Care Coordination   03/30/2023 Name: Kanessa Mova MRN: [id number] DOB: [birth year]   Care Coordination Outreach Attempts:  An unsuccessful telephone outreach was attempted for a scheduled appointment today. HIPAA compliant message left with return call phone number.   Follow Up Plan:  Additional outreach attempts will be made to offer the patient care coordination information and services.   Encounter Outcome:  No Answer   Care Coordination Interventions:  No, not indicated    [facility] Care Coordination [phone] direct line

## 2023-03-30 NOTE — Therapy (Signed)
OUTPATIENT PHYSICAL THERAPY TREATMENT   Patient Name: Christine Barrett MRN: [id number] DOB:09-04-1960, 62 y.o., female Today's Date: 03/31/2023   END OF SESSION:  PT End of Session - 03/31/23 0830     Visit Number 9    Number of Visits 16    Date for PT Re-Evaluation 04/28/23    Authorization Type [employer]    PT Start Time 0830    PT Stop Time 0925    PT Time Calculation (min) 55 min    Activity Tolerance Patient tolerated treatment well    Behavior During Therapy [facility] for tasks assessed/performed                  Past Medical History:  Diagnosis Date   Allergy    SEASONAL   Anemia    Arthritis    CAD S/P percutaneous coronary angioplasty 09/2013   a) Ostial AV G Cx - 2.5 mm Angiosculpt; mid LAD 40-60%; b) Myoview 07/2014: LOW RISK, small-severe fixed inferior defect c/w infarct w/o peri-infarct ischemia.   Chronic bronchitis (HCC)    "frequently; not q yr" (10/03/2013)   Diabetes mellitus without complication (HCC)    Type 2-diet controlled.    Diverticulosis    Essential hypertension    with prior Accelerated HTN   GERD (gastroesophageal reflux disease)    Hiatal hernia    Hx of non-ST elevation myocardial infarction (NSTEMI) 10/01/2013   Due to Accelerated HTN with existing CAD   Hyperlipemia    Migraine    "@ least once/month" (10/03/2013)   Myocardial infarction (HCC)    OSA (obstructive sleep apnea) 06/10/2016   Schatzki's ring    Seasonal allergies    Seizure (HCC)    1 in elementary school, never knew what caused it   Sinusitis    Spinal headache    during C-section of second child.    Past Surgical History:  Procedure Laterality Date   ABDOMINAL HYSTERECTOMY  1994   "partial"   CAPSULAR RELEASE Right 06/06/2020   Procedure: RIGHT SHOULDER MANIPULATION UNDER ANESTHESIA, ROTATOR CUFF REPAIR;  Surgeon: Cammy Copa, MD;  Location: [facility] OR;  Service: Orthopedics;  Laterality: Right;   CARDIAC CATHETERIZATION N/A 08/05/2015   Procedure: Left Heart  Cath and Coronary Angiography;  Surgeon: Marykay Lex, MD;  Location: [facility] INVASIVE CV LAB;  Service: Cardiovascular;  Laterality: N/A;   CESAREAN SECTION  1989   COLONOSCOPY     CORONARY ANGIOPLASTY  10/03/2013   95% ostial AV G Cx - 2.5 mm AngioSculpt Balloon PTCA; mid LAD 40-60%   LEFT HEART CATHETERIZATION WITH CORONARY ANGIOGRAM N/A 10/02/2013   Procedure: LEFT HEART CATHETERIZATION WITH CORONARY ANGIOGRAM;  Surgeon: Lennette Bihari, MD;  Location: [facility] CATH LAB;  Service: Cardiovascular;  Laterality: N/A;   NASAL SEPTOPLASTY W/ TURBINOPLASTY  ~ 2007   NM MYOVIEW LTD  08/22/2014    Low risk stress nuclear study with a small, severe, fixed defect in the distal inferior wall/apex suggestive of small prior infarct; no ischemia.  LV Wall Motion:  NL LV Function; NL Wall Motion   PERCUTANEOUS CORONARY STENT INTERVENTION (PCI-S) N/A 10/03/2013   cutting balloon angioplasty only no stent.    SINOSCOPY     TOTAL KNEE ARTHROPLASTY Left 02/02/2023   Procedure: LEFT TOTAL KNEE ARTHROPLASTY;  Surgeon: Cammy Copa, MD;  Location: [facility] OR;  Service: Orthopedics;  Laterality: Left;   TRANSTHORACIC ECHOCARDIOGRAM  10/02/2013   EF 55-60%; mild LVH, no RWMA,    TUBAL LIGATION  1989   Patient Active Problem List   Diagnosis Date Noted   Arthritis of left knee 02/14/2023   OA (osteoarthritis) of knee 02/02/2023   Status post total knee replacement, left 02/02/2023   Coronary artery disease involving native heart with angina pectoris, unspecified vessel or lesion type (HCC) 10/29/2021   Perineal irritation in female 08/15/2021   Dysuria 08/15/2021   Plantar fasciitis of right foot 07/04/2021   Trochanteric bursitis of left hip 04/08/2021   Pain of left middle finger 04/08/2021   Other allergic rhinitis 01/01/2021   Frequent episodes of sinusitis 01/01/2021   History of bronchitis 01/01/2021   Allergic conjunctivitis of both eyes 01/01/2021   Family history of breast cancer in sister 09/20/2020    Type 2 diabetes mellitus with other circulatory complications, HTN, CVD (HCC) 05/17/2020   Morbid obesity (HCC) 05/17/2020   BMI 40.0-44.9, adult (HCC) 05/17/2020   Acute non-recurrent maxillary sinusitis 11/04/2016   Acute pain of both knees 08/28/2016   OSA (obstructive sleep apnea) 06/10/2016   Costochondritis 06/09/2016   Bilateral leg cramps 02/07/2016   Peripheral edema 01/29/2016   Hypertension associated with diabetes (HCC)    GERD (gastroesophageal reflux disease) 08/10/2014   Vaginal dryness, menopausal 08/10/2014   CAD S/P percutaneous coronary angioplasty - Ostial AVG Cx - Scoring balloon PTCA 10/03/2013   Hyperlipidemia with target LDL less than 70 10/02/2013   H/O non-ST elevation myocardial infarction (NSTEMI) 10/02/2013   Normocytic anemia, not due to blood loss    Allergic sinusitis 03/17/2007   Migraine without aura 03/16/2007    PCP: Excell Seltzer, MD   REFERRING PROVIDER: Julieanne Cotton, PA-C  REFERRING DIAG: [phone] (ICD-10-CM) - Status post total knee replacement, left  THERAPY DIAG:  Acute pain of left knee  Muscle weakness (generalized)  Other abnormalities of gait and mobility  Localized edema  Stiffness of left knee, not elsewhere classified  Rationale for Evaluation and Treatment: Rehabilitation  ONSET DATE: 02/02/23 DOS   SUBJECTIVE:  SUBJECTIVE STATEMENT:  Patient reports that her knee is sore and that she has had some nausea since yesterday, she has not reached out to her MD regarding this.   PERTINENT HISTORY: See PMH above  PAIN:  Are you having pain? Yes:  NPRS scale: 7/10 Pain location: Left knee Pain description: Sore, stiff Aggravating factors: ROM, activity Relieving factors: Rest, ice, heat  PRECAUTIONS: Knee  PATIENT GOALS: To regain the function in my L knee   OBJECTIVE:  PATIENT SURVEYS:   FOTO: 55 (61 predicted)   03/23/2023: 47% functional status  MUSCLE LENGTH: deferred  LOWER EXTREMITY ROM:  A/PROM  Right eval Left eval Left 03/05/2023 Left 03/09/23 Left 03/18/23 Left 03/26/2023 Left 03/31/23  Hip flexion         Hip extension         Hip abduction         Hip adduction         Hip internal rotation         Hip external rotation         Knee flexion  75/80d 85 AROM / 90 PROM 87 PROM 94 AAROM strap 97 AAROM 98 AAROM  Knee extension  -25/-10d -6 AROM -5 AROM -5 AROM Lacking 3   Ankle dorsiflexion         Ankle plantarflexion         Ankle inversion         Ankle eversion          (  Blank rows = not tested)  LOWER EXTREMITY MMT:  MMT Right eval Left eval  Hip flexion    Hip extension    Hip abduction    Hip adduction    Hip internal rotation    Hip external rotation    Knee flexion  3  Knee extension  3-  Ankle dorsiflexion    Ankle plantarflexion    Ankle inversion    Ankle eversion     (Blank rows = not tested)  FUNCTIONAL TESTS:  30s chair stand 1 rep  GAIT: Distance walked: 80ft x2 Assistive device utilized: Environmental consultant - 2 wheeled Level of assistance: Modified independence Comments: slow cadence with step through pattern   TODAY'S TREATMENT:  [facility] Adult PT Treatment:                                                DATE: 03/31/23 Therapeutic Exercise: Recumbent bike partial revolutions x 5 min to work on knee flexion Slant board calf stretch 2 x 30 sec Knee flexion stretch with foot on 10" step 5 x 10 sec Squat with BUE support at FM bar x 10 Seated hamstring stretch 2x30 sec Lt Modified thomas stretch with passive knee flexion 3 x 30 sec Supine heel slide with strap 10 x 5 sec Manual: Seated knee joint mobs and knee flexion PROM Supine patellar mobs all directions Supine knee extension and flexion PROM Modalities: Vasopneumatic (Game Ready)   Location:  Left knee Time:  15 minutes Pressure:  Medium Temperature:  34 degrees   [facility] Adult PT Treatment:                                                DATE: 03/26/23 Therapeutic Exercise: Recumbent bike  partial revolutions x 6 min to work on knee flexion Modified thomas stretch with passive knee flexion 3 x 30 sec Supine heel slide with strap 10 x 5 sec Seated hamstring stretch 3 x 20 sec Slant board calf stretch 3 x 20 sec Knee flexion stretch with foot on 10" step 5 x 10 sec Squat with BUE support at FM bar x 10 Manual: Seated knee joint mobs and knee flexion PROM Supine patellar mobs all directions Supine knee extension and flexion PROM Modalities: Vasopneumatic (Game Ready)   Location:  Left knee Time:  15 minutes Pressure:  Medium Temperature:  34 degrees   [facility] Adult PT Treatment:                                                DATE: 03/23/23 Therapeutic Exercise: Nustep L5 x 5 min with UE/LE to work on knee motion Seated hamstring stretch 3 x 20 sec Slant board calf stretch 3 x 20 sec Calf stretch at counter 2 x 20 sec Knee flexion stretch with foot on 8" step 10 x 10 sec Manual: Seated knee joint mobs and knee flexion PROM Supine patellar mobs all directions Supine knee extension and flexion PROM Supine passive hamstring stretch with manual pressure over knee Modalities: Vasopneumatic (Game Ready)   Location:  Left knee Time:  15 minutes  Pressure:  Medium Temperature:  34 degrees    PATIENT EDUCATION:  Education details: HEP Person educated: Patient Education method: Explanation Education comprehension: verbalized understanding and needs further education  HOME EXERCISE PROGRAM: Access Code: [email or web address]   ASSESSMENT: CLINICAL IMPRESSION:  Patient presents to PT reporting continued Lt knee pain and stiffness and that she is also experiencing some nausea today. Advised her to follow up with MD if her nausea worsens or doesn't get better. Session today continued to focus on knee ROM and strengthening, she was able to achieve 98 flexion today. Patient continues to benefit from skilled PT services and should be progressed as able to improve functional independence.      OBJECTIVE IMPAIRMENTS: Abnormal gait, decreased activity tolerance, decreased coordination, decreased endurance, decreased knowledge of condition, decreased knowledge of use of DME, decreased mobility, difficulty walking, decreased ROM, decreased strength, improper body mechanics, and pain.   ACTIVITY LIMITATIONS: carrying, lifting, standing, squatting, and stairs  PERSONAL FACTORS: Age, Fitness, Past/current experiences, and 1 comorbidity: DM  are also affecting patient's functional outcome.    GOALS: Goals reviewed with patient? No  SHORT TERM GOALS: Target date: 03/26/2023   Patient to demonstrate independence in HEP  Baseline: [email or web address] Goal status: Ongoing  2.  Increase L knee AROM to 90d flexion and -10d extension Baseline:  A/PROM Right eval Left eval  Hip flexion    Hip extension    Hip abduction    Hip adduction    Hip internal rotation    Hip external rotation    Knee flexion  75/80d  Knee extension  -25/-10d   03/26/2023: see above Goal status: MET  3.  Decrease pain to 4/10 Baseline: 8/10 Goal status: Ongoing  LONG TERM GOALS: Target date: 04/23/2023   Able to ascend 16 steps with LRAD and most appropriate pattern Baseline: 4 steps with B rails and step to pattern Goal status: INITIAL  2.  Increases L knee ROM to -5d extension and 120d flexion Baseline:  A/PROM Right eval Left eval  Hip flexion    Hip extension    Hip abduction    Hip adduction    Hip internal rotation    Hip external rotation    Knee flexion  75/80d  Knee extension  -25/-10d   Goal status: INITIAL  3.  Increase L knee strength to 4/5 Baseline:  MMT Right eval Left eval  Hip flexion    Hip extension    Hip abduction    Hip adduction    Hip internal rotation    Hip external rotation    Knee flexion  3  Knee extension  3-   Goal status: INITIAL  4.  529ft ambulation with LRAD Baseline: 36ft with RW Goal status: INITIAL  5.  Increase FOTO score to  61 Baseline: 55 03/23/2023: 47% functional status Goal status: ONGOING  6.  Increase reps to 6 on 30s chair stand test Baseline: 1 rep Goal status: INITIAL   PLAN: PT FREQUENCY: 1-2x/week  PT DURATION: 8 weeks  PLANNED INTERVENTIONS: Therapeutic exercises, Therapeutic activity, Neuromuscular re-education, Balance training, Gait training, Patient/Family education, Self Care, Joint mobilization, Stair training, DME instructions, Dry Needling, Electrical stimulation, Cryotherapy, Moist heat, scar mobilization, Manual therapy, and Re-evaluation  PLAN FOR NEXT SESSION: HEP review and update, manual techniques as appropriate, aerobic tasks, ROM and flexibility activities, strengthening and PREs, TPDN, gait and balance training as needed    Berta Minor PTA 03/31/23  9:12 AM Phone: [phone] Fax: [phone]

## 2023-03-31 ENCOUNTER — Other Ambulatory Visit (HOSPITAL_COMMUNITY): Payer: Self-pay

## 2023-03-31 ENCOUNTER — Ambulatory Visit: Payer: Medicaid Other | Attending: Surgical

## 2023-03-31 ENCOUNTER — Telehealth: Payer: Self-pay

## 2023-03-31 DIAGNOSIS — R2689 Other abnormalities of gait and mobility: Secondary | ICD-10-CM | POA: Diagnosis present

## 2023-03-31 DIAGNOSIS — M6281 Muscle weakness (generalized): Secondary | ICD-10-CM | POA: Diagnosis present

## 2023-03-31 DIAGNOSIS — M25662 Stiffness of left knee, not elsewhere classified: Secondary | ICD-10-CM | POA: Insufficient documentation

## 2023-03-31 DIAGNOSIS — M25562 Pain in left knee: Secondary | ICD-10-CM | POA: Diagnosis present

## 2023-03-31 DIAGNOSIS — R600 Localized edema: Secondary | ICD-10-CM | POA: Diagnosis present

## 2023-03-31 NOTE — Telephone Encounter (Signed)
Pharmacy Patient Advocate Encounter   Received notification from Patient Advice Request messages that prior authorization for Mounjaro 2.5MG /0.5ML auto-injectors is required/requested.   Insurance verification completed.   The patient is insured through [facility] .   Per test claim: PA required; PA submitted to [facility] via CoverMyMeds Key/confirmation #/EOC [id number] Status is pending

## 2023-04-01 ENCOUNTER — Encounter: Payer: Self-pay | Admitting: Radiology

## 2023-04-01 NOTE — Therapy (Signed)
OUTPATIENT PHYSICAL THERAPY TREATMENT   Patient Name: Christine Barrett MRN: [id number] DOB:[birth year], 62 y.o., female Today's Date: 04/02/2023   END OF SESSION:  PT End of Session - 04/02/23 0832     Visit Number 10    Number of Visits 16    Date for PT Re-Evaluation 04/28/23    Authorization Type [employer]    PT Start Time 0830    PT Stop Time 0915    PT Time Calculation (min) 45 min    Activity Tolerance Patient tolerated treatment well    Behavior During Therapy [facility] for tasks assessed/performed                   Past Medical History:  Diagnosis Date   Allergy    SEASONAL   Anemia    Arthritis    CAD S/P percutaneous coronary angioplasty 09/2013   a) Ostial AV G Cx - 2.5 mm Angiosculpt; mid LAD 40-60%; b) Myoview 07/2014: LOW RISK, small-severe fixed inferior defect c/w infarct w/o peri-infarct ischemia.   Chronic bronchitis (HCC)    "frequently; not q yr" (10/03/2013)   Diabetes mellitus without complication (HCC)    Type 2-diet controlled.    Diverticulosis    Essential hypertension    with prior Accelerated HTN   GERD (gastroesophageal reflux disease)    Hiatal hernia    Hx of non-ST elevation myocardial infarction (NSTEMI) 10/01/2013   Due to Accelerated HTN with existing CAD   Hyperlipemia    Migraine    "@ least once/month" (10/03/2013)   Myocardial infarction (HCC)    OSA (obstructive sleep apnea) 06/10/2016   Schatzki's ring    Seasonal allergies    Seizure (HCC)    1 in elementary school, never knew what caused it   Sinusitis    Spinal headache    during C-section of second child.    Past Surgical History:  Procedure Laterality Date   ABDOMINAL HYSTERECTOMY  1994   "partial"   CAPSULAR RELEASE Right 06/06/2020   Procedure: RIGHT SHOULDER MANIPULATION UNDER ANESTHESIA, ROTATOR CUFF REPAIR;  Surgeon: Cammy Copa, MD;  Location: [facility] OR;  Service: Orthopedics;  Laterality: Right;   CARDIAC CATHETERIZATION N/A 08/05/2015   Procedure: Left  Heart Cath and Coronary Angiography;  Surgeon: Marykay Lex, MD;  Location: [facility] INVASIVE CV LAB;  Service: Cardiovascular;  Laterality: N/A;   CESAREAN SECTION  1989   COLONOSCOPY     CORONARY ANGIOPLASTY  10/03/2013   95% ostial AV G Cx - 2.5 mm AngioSculpt Balloon PTCA; mid LAD 40-60%   LEFT HEART CATHETERIZATION WITH CORONARY ANGIOGRAM N/A 10/02/2013   Procedure: LEFT HEART CATHETERIZATION WITH CORONARY ANGIOGRAM;  Surgeon: Lennette Bihari, MD;  Location: [facility] CATH LAB;  Service: Cardiovascular;  Laterality: N/A;   NASAL SEPTOPLASTY W/ TURBINOPLASTY  ~ 2007   NM MYOVIEW LTD  08/22/2014    Low risk stress nuclear study with a small, severe, fixed defect in the distal inferior wall/apex suggestive of small prior infarct; no ischemia.  LV Wall Motion:  NL LV Function; NL Wall Motion   PERCUTANEOUS CORONARY STENT INTERVENTION (PCI-S) N/A 10/03/2013   cutting balloon angioplasty only no stent.    SINOSCOPY     TOTAL KNEE ARTHROPLASTY Left 02/02/2023   Procedure: LEFT TOTAL KNEE ARTHROPLASTY;  Surgeon: Cammy Copa, MD;  Location: [facility] OR;  Service: Orthopedics;  Laterality: Left;   TRANSTHORACIC ECHOCARDIOGRAM  10/02/2013   EF 55-60%; mild LVH, no RWMA,    TUBAL LIGATION  1989   Patient Active Problem List   Diagnosis Date Noted   Arthritis of left knee 02/14/2023   OA (osteoarthritis) of knee 02/02/2023   Status post total knee replacement, left 02/02/2023   Coronary artery disease involving native heart with angina pectoris, unspecified vessel or lesion type (HCC) 10/29/2021   Perineal irritation in female 08/15/2021   Dysuria 08/15/2021   Plantar fasciitis of right foot 07/04/2021   Trochanteric bursitis of left hip 04/08/2021   Pain of left middle finger 04/08/2021   Other allergic rhinitis 01/01/2021   Frequent episodes of sinusitis 01/01/2021   History of bronchitis 01/01/2021   Allergic conjunctivitis of both eyes 01/01/2021   Family history of breast cancer in sister  09/20/2020   Type 2 diabetes mellitus with other circulatory complications, HTN, CVD (HCC) 05/17/2020   Morbid obesity (HCC) 05/17/2020   BMI 40.0-44.9, adult (HCC) 05/17/2020   Acute non-recurrent maxillary sinusitis 11/04/2016   Acute pain of both knees 08/28/2016   OSA (obstructive sleep apnea) 06/10/2016   Costochondritis 06/09/2016   Bilateral leg cramps 02/07/2016   Peripheral edema 01/29/2016   Hypertension associated with diabetes (HCC)    GERD (gastroesophageal reflux disease) 08/10/2014   Vaginal dryness, menopausal 08/10/2014   CAD S/P percutaneous coronary angioplasty - Ostial AVG Cx - Scoring balloon PTCA 10/03/2013   Hyperlipidemia with target LDL less than 70 10/02/2013   H/O non-ST elevation myocardial infarction (NSTEMI) 10/02/2013   Normocytic anemia, not due to blood loss    Allergic sinusitis 03/17/2007   Migraine without aura 03/16/2007    PCP: Excell Seltzer, MD   REFERRING PROVIDER: Julieanne Cotton, PA-C  REFERRING DIAG: [phone] (ICD-10-CM) - Status post total knee replacement, left  THERAPY DIAG:  Muscle weakness (generalized)  Other abnormalities of gait and mobility  Acute pain of left knee  Rationale for Evaluation and Treatment: Rehabilitation  ONSET DATE: 02/02/23 DOS   SUBJECTIVE:  SUBJECTIVE STATEMENT:  Does not relate any sharp pain just soreness and stiffness through knee. Most noted getting in/out of car  PERTINENT HISTORY: See PMH above  PAIN:  Are you having pain? Yes:  NPRS scale: 7/10 Pain location: Left knee Pain description: Sore, stiff Aggravating factors: ROM, activity Relieving factors: Rest, ice, heat  PRECAUTIONS: Knee  PATIENT GOALS: To regain the function in my L knee   OBJECTIVE:  PATIENT SURVEYS:   FOTO: 55 (61 predicted)   03/23/2023: 47% functional status  MUSCLE LENGTH: deferred  LOWER EXTREMITY ROM:  A/PROM Right eval Left eval Left 03/05/2023 Left 03/09/23 Left 03/18/23 Left 03/26/2023 Left   03/31/23  Hip flexion         Hip extension         Hip abduction         Hip adduction         Hip internal rotation         Hip external rotation         Knee flexion  75/80d 85 AROM / 90 PROM 87 PROM 94 AAROM strap 97 AAROM 98 AAROM  Knee extension  -25/-10d -6 AROM -5 AROM -5 AROM Lacking 3   Ankle dorsiflexion         Ankle plantarflexion         Ankle inversion         Ankle eversion          (Blank rows = not tested)  LOWER EXTREMITY MMT:  MMT Right eval Left eval L 04/02/23  Hip flexion     Hip extension     Hip abduction     Hip adduction     Hip internal rotation     Hip external rotation     Knee flexion  3 3+  Knee extension  3- 3  Ankle dorsiflexion     Ankle plantarflexion     Ankle inversion     Ankle eversion      (Blank rows = not tested)  FUNCTIONAL TESTS:  30s chair stand 1 rep  GAIT: Distance walked: 43ft x2 Assistive device utilized: Environmental consultant - 2 wheeled Level of assistance: Modified independence Comments: slow cadence with step through pattern   TODAY'S TREATMENT:  [facility] Adult PT Treatment:                                                DATE: 04/02/23 Therapeutic Exercise: SLR L with QS 15x SAQs 3# 15x2  Heel slides with strap over slide board 15x 95d initial, 104 final FAQs with adduction 15x TKEs GTB 15x Heel raises over 4 in step Modalities: Cold pack 10 min(held vaso to quantify post treatment soreness)  [facility] Adult PT Treatment:                                                DATE: 03/31/23 Therapeutic Exercise: Recumbent bike partial revolutions x 5 min to work on knee flexion Slant board calf stretch 2 x 30 sec Knee flexion stretch with foot on 10" step 5 x 10 sec Squat with BUE support at FM bar x 10 Seated hamstring stretch 2x30 sec Lt Modified thomas stretch with passive knee flexion 3 x 30 sec Supine heel slide with strap 10 x 5 sec Manual: Seated knee joint mobs and knee flexion PROM Supine patellar mobs all  directions Supine knee extension and flexion PROM Modalities: Vasopneumatic (Game Ready)   Location:  Left knee Time:  15 minutes Pressure:  Medium Temperature:  34 degrees   [facility] Adult PT Treatment:                                                DATE: 03/26/23 Therapeutic Exercise: Recumbent bike partial revolutions x 6 min to work on knee flexion Modified thomas stretch with passive knee flexion 3 x 30 sec Supine heel slide with strap 10 x 5 sec Seated hamstring stretch 3 x 20 sec Slant board calf stretch 3 x 20 sec Knee flexion stretch with foot on 10" step 5 x 10 sec Squat with BUE support at FM bar x 10 Manual: Seated knee joint mobs and knee flexion PROM Supine patellar mobs all directions Supine knee extension and flexion PROM Modalities: Vasopneumatic (Game Ready)   Location:  Left knee Time:  15 minutes Pressure:  Medium Temperature:  34 degrees   [facility] Adult PT Treatment:  DATE: 03/23/23 Therapeutic Exercise: Nustep L5 x 5 min with UE/LE to work on knee motion Seated hamstring stretch 3 x 20 sec Slant board calf stretch 3 x 20 sec Calf stretch at counter 2 x 20 sec Knee flexion stretch with foot on 8" step 10 x 10 sec Manual: Seated knee joint mobs and knee flexion PROM Supine patellar mobs all directions Supine knee extension and flexion PROM Supine passive hamstring stretch with manual pressure over knee Modalities: Vasopneumatic (Game Ready)   Location:  Left knee Time:  15 minutes Pressure:  Medium Temperature:  34 degrees    PATIENT EDUCATION:  Education details: HEP Person educated: Patient Education method: Explanation Education comprehension: verbalized understanding and needs further education  HOME EXERCISE PROGRAM: Access Code: [email or web address]   ASSESSMENT: CLINICAL IMPRESSION: Pain has essentially resolved but stiffness and soreness persists.  Struggles getting into a car.  Focus of today was  resolving quad weakness and facilitation of a more productive contraction.  Limited WB tasks because of soreness and lack of quad control.  Able to attain 104d flexion following heel slides with strap.  Added TKEs to further facilitate quad contraction.  Emphasized TKE during gait phase.  Held vaso to quantify post session soreness.    OBJECTIVE IMPAIRMENTS: Abnormal gait, decreased activity tolerance, decreased coordination, decreased endurance, decreased knowledge of condition, decreased knowledge of use of DME, decreased mobility, difficulty walking, decreased ROM, decreased strength, improper body mechanics, and pain.   ACTIVITY LIMITATIONS: carrying, lifting, standing, squatting, and stairs  PERSONAL FACTORS: Age, Fitness, Past/current experiences, and 1 comorbidity: DM  are also affecting patient's functional outcome.    GOALS: Goals reviewed with patient? No  SHORT TERM GOALS: Target date: 03/26/2023   Patient to demonstrate independence in HEP  Baseline: [email or web address] Goal status: Met  2.  Increase L knee AROM to 90d flexion and -10d extension Baseline:  A/PROM Right eval Left eval  Hip flexion    Hip extension    Hip abduction    Hip adduction    Hip internal rotation    Hip external rotation    Knee flexion  75/80d  Knee extension  -25/-10d   03/26/2023: see above Goal status: MET  3.  Decrease pain to 4/10 Baseline: 8/10 Goal status: Ongoing  LONG TERM GOALS: Target date: 04/23/2023   Able to ascend 16 steps with LRAD and most appropriate pattern Baseline: 4 steps with B rails and step to pattern Goal status: INITIAL  2.  Increases L knee ROM to -5d extension and 120d flexion Baseline:  A/PROM Right eval Left eval  Hip flexion    Hip extension    Hip abduction    Hip adduction    Hip internal rotation    Hip external rotation    Knee flexion  75/80d  Knee extension  -25/-10d   Goal status: INITIAL  3.  Increase L knee strength to 4/5 Baseline:  MMT  Right eval Left eval  Hip flexion    Hip extension    Hip abduction    Hip adduction    Hip internal rotation    Hip external rotation    Knee flexion  3  Knee extension  3-   Goal status: INITIAL  4.  525ft ambulation with LRAD Baseline: 26ft with RW Goal status: INITIAL  5.  Increase FOTO score to 61 Baseline: 55 03/23/2023: 47% functional status Goal status: ONGOING  6.  Increase reps to 6 on 30s chair stand test Baseline: 1 rep Goal  status: INITIAL   PLAN: PT FREQUENCY: 1-2x/week  PT DURATION: 8 weeks  PLANNED INTERVENTIONS: Therapeutic exercises, Therapeutic activity, Neuromuscular re-education, Balance training, Gait training, Patient/Family education, Self Care, Joint mobilization, Stair training, DME instructions, Dry Needling, Electrical stimulation, Cryotherapy, Moist heat, scar mobilization, Manual therapy, and Re-evaluation  PLAN FOR NEXT SESSION: HEP review and update, manual techniques as appropriate, aerobic tasks, ROM and flexibility activities, strengthening and PREs, TPDN, gait and balance training as needed    Hildred Laser PT 04/02/23  10:59 AM Phone: [phone] Fax: [phone]

## 2023-04-01 NOTE — Telephone Encounter (Signed)
Pharmacy Patient Advocate Encounter  Received notification from [facility] that Prior Authorization for [facility] 2.5MG /0.5ML auto-injectors  has been APPROVED from 03/31/23 to 03/30/24   PA #/Case ID/Reference #: [id number]

## 2023-04-02 ENCOUNTER — Ambulatory Visit: Payer: Medicaid Other

## 2023-04-02 DIAGNOSIS — M6281 Muscle weakness (generalized): Secondary | ICD-10-CM

## 2023-04-02 DIAGNOSIS — R2689 Other abnormalities of gait and mobility: Secondary | ICD-10-CM

## 2023-04-02 DIAGNOSIS — M25562 Pain in left knee: Secondary | ICD-10-CM

## 2023-04-06 NOTE — Therapy (Signed)
OUTPATIENT PHYSICAL THERAPY TREATMENT   Patient Name: Christine Barrett MRN: [id number] DOB:25-Feb-1961, 62 y.o., female Today's Date: 04/07/2023   END OF SESSION:  PT End of Session - 04/07/23 0829     Visit Number 11    Number of Visits 16    Date for PT Re-Evaluation 04/28/23    Authorization Type [employer]    PT Start Time 0830    PT Stop Time 0920    PT Time Calculation (min) 50 min    Activity Tolerance Patient tolerated treatment well    Behavior During Therapy [facility] for tasks assessed/performed             Past Medical History:  Diagnosis Date   Allergy    SEASONAL   Anemia    Arthritis    CAD S/P percutaneous coronary angioplasty 09/2013   a) Ostial AV G Cx - 2.5 mm Angiosculpt; mid LAD 40-60%; b) Myoview 07/2014: LOW RISK, small-severe fixed inferior defect c/w infarct w/o peri-infarct ischemia.   Chronic bronchitis (HCC)    "frequently; not q yr" (10/03/2013)   Diabetes mellitus without complication (HCC)    Type 2-diet controlled.    Diverticulosis    Essential hypertension    with prior Accelerated HTN   GERD (gastroesophageal reflux disease)    Hiatal hernia    Hx of non-ST elevation myocardial infarction (NSTEMI) 10/01/2013   Due to Accelerated HTN with existing CAD   Hyperlipemia    Migraine    "@ least once/month" (10/03/2013)   Myocardial infarction (HCC)    OSA (obstructive sleep apnea) 06/10/2016   Schatzki's ring    Seasonal allergies    Seizure (HCC)    1 in elementary school, never knew what caused it   Sinusitis    Spinal headache    during C-section of second child.    Past Surgical History:  Procedure Laterality Date   ABDOMINAL HYSTERECTOMY  1994   "partial"   CAPSULAR RELEASE Right 06/06/2020   Procedure: RIGHT SHOULDER MANIPULATION UNDER ANESTHESIA, ROTATOR CUFF REPAIR;  Surgeon: Cammy Copa, MD;  Location: [facility] OR;  Service: Orthopedics;  Laterality: Right;   CARDIAC CATHETERIZATION N/A 08/05/2015   Procedure: Left Heart Cath and  Coronary Angiography;  Surgeon: Marykay Lex, MD;  Location: [facility] INVASIVE CV LAB;  Service: Cardiovascular;  Laterality: N/A;   CESAREAN SECTION  1989   COLONOSCOPY     CORONARY ANGIOPLASTY  10/03/2013   95% ostial AV G Cx - 2.5 mm AngioSculpt Balloon PTCA; mid LAD 40-60%   LEFT HEART CATHETERIZATION WITH CORONARY ANGIOGRAM N/A 10/02/2013   Procedure: LEFT HEART CATHETERIZATION WITH CORONARY ANGIOGRAM;  Surgeon: Lennette Bihari, MD;  Location: [facility] CATH LAB;  Service: Cardiovascular;  Laterality: N/A;   NASAL SEPTOPLASTY W/ TURBINOPLASTY  ~ 2007   NM MYOVIEW LTD  08/22/2014    Low risk stress nuclear study with a small, severe, fixed defect in the distal inferior wall/apex suggestive of small prior infarct; no ischemia.  LV Wall Motion:  NL LV Function; NL Wall Motion   PERCUTANEOUS CORONARY STENT INTERVENTION (PCI-S) N/A 10/03/2013   cutting balloon angioplasty only no stent.    SINOSCOPY     TOTAL KNEE ARTHROPLASTY Left 02/02/2023   Procedure: LEFT TOTAL KNEE ARTHROPLASTY;  Surgeon: Cammy Copa, MD;  Location: [facility] OR;  Service: Orthopedics;  Laterality: Left;   TRANSTHORACIC ECHOCARDIOGRAM  10/02/2013   EF 55-60%; mild LVH, no RWMA,    TUBAL LIGATION  1989   Patient Active  Problem List   Diagnosis Date Noted   Arthritis of left knee 02/14/2023   OA (osteoarthritis) of knee 02/02/2023   Status post total knee replacement, left 02/02/2023   Coronary artery disease involving native heart with angina pectoris, unspecified vessel or lesion type (HCC) 10/29/2021   Perineal irritation in female 08/15/2021   Dysuria 08/15/2021   Plantar fasciitis of right foot 07/04/2021   Trochanteric bursitis of left hip 04/08/2021   Pain of left middle finger 04/08/2021   Other allergic rhinitis 01/01/2021   Frequent episodes of sinusitis 01/01/2021   History of bronchitis 01/01/2021   Allergic conjunctivitis of both eyes 01/01/2021   Family history of breast cancer in sister 09/20/2020   Type 2  diabetes mellitus with other circulatory complications, HTN, CVD (HCC) 05/17/2020   Morbid obesity (HCC) 05/17/2020   BMI 40.0-44.9, adult (HCC) 05/17/2020   Acute non-recurrent maxillary sinusitis 11/04/2016   Acute pain of both knees 08/28/2016   OSA (obstructive sleep apnea) 06/10/2016   Costochondritis 06/09/2016   Bilateral leg cramps 02/07/2016   Peripheral edema 01/29/2016   Hypertension associated with diabetes (HCC)    GERD (gastroesophageal reflux disease) 08/10/2014   Vaginal dryness, menopausal 08/10/2014   CAD S/P percutaneous coronary angioplasty - Ostial AVG Cx - Scoring balloon PTCA 10/03/2013   Hyperlipidemia with target LDL less than 70 10/02/2013   H/O non-ST elevation myocardial infarction (NSTEMI) 10/02/2013   Normocytic anemia, not due to blood loss    Allergic sinusitis 03/17/2007   Migraine without aura 03/16/2007    PCP: Excell Seltzer, MD   REFERRING PROVIDER: Julieanne Cotton, PA-C  REFERRING DIAG: [phone] (ICD-10-CM) - Status post total knee replacement, left  THERAPY DIAG:  Muscle weakness (generalized)  Other abnormalities of gait and mobility  Acute pain of left knee  Localized edema  Rationale for Evaluation and Treatment: Rehabilitation  ONSET DATE: 02/02/23 DOS   SUBJECTIVE:  SUBJECTIVE STATEMENT:  Patient reports stiffness in her knee today and states that previous session she had increased soreness afterwards.   PERTINENT HISTORY: See PMH above  PAIN:  Are you having pain? Yes:  NPRS scale: 7/10 Pain location: Left knee Pain description: Sore, stiff Aggravating factors: ROM, activity Relieving factors: Rest, ice, heat  PRECAUTIONS: Knee  PATIENT GOALS: To regain the function in my L knee   OBJECTIVE:  PATIENT SURVEYS:   FOTO: 55 (61 predicted)   03/23/2023: 47% functional status  MUSCLE LENGTH: deferred  LOWER EXTREMITY ROM:  A/PROM Right eval Left eval Left 03/05/2023 Left 03/09/23 Left 03/18/23 Left 03/26/2023  Left  03/31/23  Hip flexion         Hip extension         Hip abduction         Hip adduction         Hip internal rotation         Hip external rotation         Knee flexion  75/80d 85 AROM / 90 PROM 87 PROM 94 AAROM strap 97 AAROM 98 AAROM  Knee extension  -25/-10d -6 AROM -5 AROM -5 AROM Lacking 3   Ankle dorsiflexion         Ankle plantarflexion         Ankle inversion         Ankle eversion          (Blank rows = not tested)  LOWER EXTREMITY MMT:  MMT Right eval Left eval L 04/02/23  Hip flexion  Hip extension     Hip abduction     Hip adduction     Hip internal rotation     Hip external rotation     Knee flexion  3 3+  Knee extension  3- 3  Ankle dorsiflexion     Ankle plantarflexion     Ankle inversion     Ankle eversion      (Blank rows = not tested)  FUNCTIONAL TESTS:  30s chair stand 1 rep  GAIT: Distance walked: 38ft x2 Assistive device utilized: Environmental consultant - 2 wheeled Level of assistance: Modified independence Comments: slow cadence with step through pattern   TODAY'S TREATMENT:  [facility] Adult PT Treatment:                                                DATE: 04/07/23 Therapeutic Exercise: Recumbent bike partial revolutions x 5 min to work on knee flexion SLR L with QS 15x SAQs 3# 15x2  Heel slides with strap over slide board 15x 85d initial, 100 final FAQs with adduction 15x TKEs GTB x10 Heel raises over 4 in step 2x10 Modalities: Cold pack 10 min post session (held vaso to quantify post treatment soreness)   [facility] Adult PT Treatment:                                                DATE: 04/02/23 Therapeutic Exercise: SLR L with QS 15x SAQs 3# 15x2  Heel slides with strap over slide board 15x 95d initial, 104 final FAQs with adduction 15x TKEs GTB 15x Heel raises over 4 in step Modalities: Cold pack 10 min(held vaso to quantify post treatment soreness)  [facility] Adult PT Treatment:                                                DATE:  03/31/23 Therapeutic Exercise: Recumbent bike partial revolutions x 5 min to work on knee flexion Slant board calf stretch 2 x 30 sec Knee flexion stretch with foot on 10" step 5 x 10 sec Squat with BUE support at FM bar x 10 Seated hamstring stretch 2x30 sec Lt Modified thomas stretch with passive knee flexion 3 x 30 sec Supine heel slide with strap 10 x 5 sec Manual: Seated knee joint mobs and knee flexion PROM Supine patellar mobs all directions Supine knee extension and flexion PROM Modalities: Vasopneumatic (Game Ready)   Location:  Left knee Time:  15 minutes Pressure:  Medium Temperature:  34 degrees    PATIENT EDUCATION:  Education details: HEP Person educated: Patient Education method: Explanation Education comprehension: verbalized understanding and needs further education  HOME EXERCISE PROGRAM: Access Code: [email or web address]   ASSESSMENT: CLINICAL IMPRESSION:  Patient presents to PT reporting increased stiffness and soreness in her Lt knee. Session today continued to focus on quad contraction and knee ROM. Able to obtain 100 flexion today. Patient continues to benefit from skilled PT services and should be progressed as able to improve functional independence.     OBJECTIVE IMPAIRMENTS: Abnormal gait, decreased activity tolerance, decreased coordination, decreased endurance, decreased knowledge of condition, decreased  knowledge of use of DME, decreased mobility, difficulty walking, decreased ROM, decreased strength, improper body mechanics, and pain.   ACTIVITY LIMITATIONS: carrying, lifting, standing, squatting, and stairs  PERSONAL FACTORS: Age, Fitness, Past/current experiences, and 1 comorbidity: DM  are also affecting patient's functional outcome.    GOALS: Goals reviewed with patient? No  SHORT TERM GOALS: Target date: 03/26/2023   Patient to demonstrate independence in HEP  Baseline: [email or web address] Goal status: Met  2.  Increase L knee AROM to 90d flexion and  -10d extension Baseline:  A/PROM Right eval Left eval  Hip flexion    Hip extension    Hip abduction    Hip adduction    Hip internal rotation    Hip external rotation    Knee flexion  75/80d  Knee extension  -25/-10d   03/26/2023: see above Goal status: MET  3.  Decrease pain to 4/10 Baseline: 8/10 Goal status: Ongoing  LONG TERM GOALS: Target date: 04/23/2023   Able to ascend 16 steps with LRAD and most appropriate pattern Baseline: 4 steps with B rails and step to pattern Goal status: INITIAL  2.  Increases L knee ROM to -5d extension and 120d flexion Baseline:  A/PROM Right eval Left eval  Hip flexion    Hip extension    Hip abduction    Hip adduction    Hip internal rotation    Hip external rotation    Knee flexion  75/80d  Knee extension  -25/-10d   Goal status: INITIAL  3.  Increase L knee strength to 4/5 Baseline:  MMT Right eval Left eval  Hip flexion    Hip extension    Hip abduction    Hip adduction    Hip internal rotation    Hip external rotation    Knee flexion  3  Knee extension  3-   Goal status: INITIAL  4.  511ft ambulation with LRAD Baseline: 12ft with RW Goal status: INITIAL  5.  Increase FOTO score to 61 Baseline: 55 03/23/2023: 47% functional status Goal status: ONGOING  6.  Increase reps to 6 on 30s chair stand test Baseline: 1 rep Goal status: INITIAL   PLAN: PT FREQUENCY: 1-2x/week  PT DURATION: 8 weeks  PLANNED INTERVENTIONS: Therapeutic exercises, Therapeutic activity, Neuromuscular re-education, Balance training, Gait training, Patient/Family education, Self Care, Joint mobilization, Stair training, DME instructions, Dry Needling, Electrical stimulation, Cryotherapy, Moist heat, scar mobilization, Manual therapy, and Re-evaluation  PLAN FOR NEXT SESSION: HEP review and update, manual techniques as appropriate, aerobic tasks, ROM and flexibility activities, strengthening and PREs, TPDN, gait and balance training  as needed    Berta Minor PTA 04/07/23  9:11 AM Phone: [phone] Fax: [phone]

## 2023-04-06 NOTE — Therapy (Unsigned)
OUTPATIENT PHYSICAL THERAPY TREATMENT   Patient Name: Christine Barrett MRN: [id number] DOB:1961/03/08, 62 y.o., female Today's Date: 04/06/2023   END OF SESSION:          Past Medical History:  Diagnosis Date   Allergy    SEASONAL   Anemia    Arthritis    CAD S/P percutaneous coronary angioplasty 09/2013   a) Ostial AV G Cx - 2.5 mm Angiosculpt; mid LAD 40-60%; b) Myoview 07/2014: LOW RISK, small-severe fixed inferior defect c/w infarct w/o peri-infarct ischemia.   Chronic bronchitis (HCC)    "frequently; not q yr" (10/03/2013)   Diabetes mellitus without complication (HCC)    Type 2-diet controlled.    Diverticulosis    Essential hypertension    with prior Accelerated HTN   GERD (gastroesophageal reflux disease)    Hiatal hernia    Hx of non-ST elevation myocardial infarction (NSTEMI) 10/01/2013   Due to Accelerated HTN with existing CAD   Hyperlipemia    Migraine    "@ least once/month" (10/03/2013)   Myocardial infarction (HCC)    OSA (obstructive sleep apnea) 06/10/2016   Schatzki's ring    Seasonal allergies    Seizure (HCC)    1 in elementary school, never knew what caused it   Sinusitis    Spinal headache    during C-section of second child.    Past Surgical History:  Procedure Laterality Date   ABDOMINAL HYSTERECTOMY  1994   "partial"   CAPSULAR RELEASE Right 06/06/2020   Procedure: RIGHT SHOULDER MANIPULATION UNDER ANESTHESIA, ROTATOR CUFF REPAIR;  Surgeon: Cammy Copa, MD;  Location: [facility] OR;  Service: Orthopedics;  Laterality: Right;   CARDIAC CATHETERIZATION N/A 08/05/2015   Procedure: Left Heart Cath and Coronary Angiography;  Surgeon: Marykay Lex, MD;  Location: [facility] INVASIVE CV LAB;  Service: Cardiovascular;  Laterality: N/A;   CESAREAN SECTION  1989   COLONOSCOPY     CORONARY ANGIOPLASTY  10/03/2013   95% ostial AV G Cx - 2.5 mm AngioSculpt Balloon PTCA; mid LAD 40-60%   LEFT HEART CATHETERIZATION WITH CORONARY ANGIOGRAM N/A 10/02/2013    Procedure: LEFT HEART CATHETERIZATION WITH CORONARY ANGIOGRAM;  Surgeon: Lennette Bihari, MD;  Location: [facility] CATH LAB;  Service: Cardiovascular;  Laterality: N/A;   NASAL SEPTOPLASTY W/ TURBINOPLASTY  ~ 2007   NM MYOVIEW LTD  08/22/2014    Low risk stress nuclear study with a small, severe, fixed defect in the distal inferior wall/apex suggestive of small prior infarct; no ischemia.  LV Wall Motion:  NL LV Function; NL Wall Motion   PERCUTANEOUS CORONARY STENT INTERVENTION (PCI-S) N/A 10/03/2013   cutting balloon angioplasty only no stent.    SINOSCOPY     TOTAL KNEE ARTHROPLASTY Left 02/02/2023   Procedure: LEFT TOTAL KNEE ARTHROPLASTY;  Surgeon: Cammy Copa, MD;  Location: [facility] OR;  Service: Orthopedics;  Laterality: Left;   TRANSTHORACIC ECHOCARDIOGRAM  10/02/2013   EF 55-60%; mild LVH, no RWMA,    TUBAL LIGATION  1989   Patient Active Problem List   Diagnosis Date Noted   Arthritis of left knee 02/14/2023   OA (osteoarthritis) of knee 02/02/2023   Status post total knee replacement, left 02/02/2023   Coronary artery disease involving native heart with angina pectoris, unspecified vessel or lesion type (HCC) 10/29/2021   Perineal irritation in female 08/15/2021   Dysuria 08/15/2021   Plantar fasciitis of right foot 07/04/2021   Trochanteric bursitis of left hip 04/08/2021   Pain of left middle finger  04/08/2021   Other allergic rhinitis 01/01/2021   Frequent episodes of sinusitis 01/01/2021   History of bronchitis 01/01/2021   Allergic conjunctivitis of both eyes 01/01/2021   Family history of breast cancer in sister 09/20/2020   Type 2 diabetes mellitus with other circulatory complications, HTN, CVD (HCC) 05/17/2020   Morbid obesity (HCC) 05/17/2020   BMI 40.0-44.9, adult (HCC) 05/17/2020   Acute non-recurrent maxillary sinusitis 11/04/2016   Acute pain of both knees 08/28/2016   OSA (obstructive sleep apnea) 06/10/2016   Costochondritis 06/09/2016   Bilateral leg cramps  02/07/2016   Peripheral edema 01/29/2016   Hypertension associated with diabetes (HCC)    GERD (gastroesophageal reflux disease) 08/10/2014   Vaginal dryness, menopausal 08/10/2014   CAD S/P percutaneous coronary angioplasty - Ostial AVG Cx - Scoring balloon PTCA 10/03/2013   Hyperlipidemia with target LDL less than 70 10/02/2013   H/O non-ST elevation myocardial infarction (NSTEMI) 10/02/2013   Normocytic anemia, not due to blood loss    Allergic sinusitis 03/17/2007   Migraine without aura 03/16/2007    PCP: Excell Seltzer, MD   REFERRING PROVIDER: Julieanne Cotton, PA-C  REFERRING DIAG: [id number] (ICD-10-CM) - Status post total knee replacement, left  THERAPY DIAG:  No diagnosis found.  Rationale for Evaluation and Treatment: Rehabilitation  ONSET DATE: 02/02/23 DOS   SUBJECTIVE:  SUBJECTIVE STATEMENT:  Does not relate any sharp pain just soreness and stiffness through knee. Most noted getting in/out of car  PERTINENT HISTORY: See PMH above  PAIN:  Are you having pain? Yes:  NPRS scale: 7/10 Pain location: Left knee Pain description: Sore, stiff Aggravating factors: ROM, activity Relieving factors: Rest, ice, heat  PRECAUTIONS: Knee  PATIENT GOALS: To regain the function in my L knee   OBJECTIVE:  PATIENT SURVEYS:   FOTO: 55 (61 predicted)   03/23/2023: 47% functional status  MUSCLE LENGTH: deferred  LOWER EXTREMITY ROM:  A/PROM Right eval Left eval Left 03/05/2023 Left 03/09/23 Left 03/18/23 Left 03/26/2023 Left  03/31/23  Hip flexion         Hip extension         Hip abduction         Hip adduction         Hip internal rotation         Hip external rotation         Knee flexion  75/80d 85 AROM / 90 PROM 87 PROM 94 AAROM strap 97 AAROM 98 AAROM  Knee extension  -25/-10d -6 AROM -5 AROM -5 AROM Lacking 3   Ankle dorsiflexion         Ankle plantarflexion         Ankle inversion         Ankle eversion          (Blank rows = not tested)  LOWER  EXTREMITY MMT:  MMT Right eval Left eval L 04/02/23  Hip flexion     Hip extension     Hip abduction     Hip adduction     Hip internal rotation     Hip external rotation     Knee flexion  3 3+  Knee extension  3- 3  Ankle dorsiflexion     Ankle plantarflexion     Ankle inversion     Ankle eversion      (Blank rows = not tested)  FUNCTIONAL TESTS:  30s chair stand 1 rep  GAIT: Distance walked: 11ft x2 Assistive device utilized: Environmental consultant -  2 wheeled Level of assistance: Modified independence Comments: slow cadence with step through pattern   TODAY'S TREATMENT:  [facility] Adult PT Treatment:                                                DATE: 04/02/23 Therapeutic Exercise: SLR L with QS 15x SAQs 3# 15x2  Heel slides with strap over slide board 15x 95d initial, 104 final FAQs with adduction 15x TKEs GTB 15x Heel raises over 4 in step Modalities: Cold pack 10 min(held vaso to quantify post treatment soreness)  [facility] Adult PT Treatment:                                                DATE: 03/31/23 Therapeutic Exercise: Recumbent bike partial revolutions x 5 min to work on knee flexion Slant board calf stretch 2 x 30 sec Knee flexion stretch with foot on 10" step 5 x 10 sec Squat with BUE support at FM bar x 10 Seated hamstring stretch 2x30 sec Lt Modified thomas stretch with passive knee flexion 3 x 30 sec Supine heel slide with strap 10 x 5 sec Manual: Seated knee joint mobs and knee flexion PROM Supine patellar mobs all directions Supine knee extension and flexion PROM Modalities: Vasopneumatic (Game Ready)   Location:  Left knee Time:  15 minutes Pressure:  Medium Temperature:  34 degrees   [facility] Adult PT Treatment:                                                DATE: 03/26/23 Therapeutic Exercise: Recumbent bike partial revolutions x 6 min to work on knee flexion Modified thomas stretch with passive knee flexion 3 x 30 sec Supine heel slide with strap 10 x 5  sec Seated hamstring stretch 3 x 20 sec Slant board calf stretch 3 x 20 sec Knee flexion stretch with foot on 10" step 5 x 10 sec Squat with BUE support at FM bar x 10 Manual: Seated knee joint mobs and knee flexion PROM Supine patellar mobs all directions Supine knee extension and flexion PROM Modalities: Vasopneumatic (Game Ready)   Location:  Left knee Time:  15 minutes Pressure:  Medium Temperature:  34 degrees   [facility] Adult PT Treatment:                                                DATE: 03/23/23 Therapeutic Exercise: Nustep L5 x 5 min with UE/LE to work on knee motion Seated hamstring stretch 3 x 20 sec Slant board calf stretch 3 x 20 sec Calf stretch at counter 2 x 20 sec Knee flexion stretch with foot on 8" step 10 x 10 sec Manual: Seated knee joint mobs and knee flexion PROM Supine patellar mobs all directions Supine knee extension and flexion PROM Supine passive hamstring stretch with manual pressure over knee Modalities: Vasopneumatic (Game Ready)   Location:  Left knee Time:  15 minutes Pressure:  Medium  Temperature:  34 degrees    PATIENT EDUCATION:  Education details: HEP Person educated: Patient Education method: Explanation Education comprehension: verbalized understanding and needs further education  HOME EXERCISE PROGRAM: Access Code: [email or web address]   ASSESSMENT: CLINICAL IMPRESSION: Pain has essentially resolved but stiffness and soreness persists.  Struggles getting into a car.  Focus of today was resolving quad weakness and facilitation of a more productive contraction.  Limited WB tasks because of soreness and lack of quad control.  Able to attain 104d flexion following heel slides with strap.  Added TKEs to further facilitate quad contraction.  Emphasized TKE during gait phase.  Held vaso to quantify post session soreness.    OBJECTIVE IMPAIRMENTS: Abnormal gait, decreased activity tolerance, decreased coordination, decreased endurance, decreased  knowledge of condition, decreased knowledge of use of DME, decreased mobility, difficulty walking, decreased ROM, decreased strength, improper body mechanics, and pain.   ACTIVITY LIMITATIONS: carrying, lifting, standing, squatting, and stairs  PERSONAL FACTORS: Age, Fitness, Past/current experiences, and 1 comorbidity: DM  are also affecting patient's functional outcome.    GOALS: Goals reviewed with patient? No  SHORT TERM GOALS: Target date: 03/26/2023   Patient to demonstrate independence in HEP  Baseline: [email or web address] Goal status: Met  2.  Increase L knee AROM to 90d flexion and -10d extension Baseline:  A/PROM Right eval Left eval  Hip flexion    Hip extension    Hip abduction    Hip adduction    Hip internal rotation    Hip external rotation    Knee flexion  75/80d  Knee extension  -25/-10d   03/26/2023: see above Goal status: MET  3.  Decrease pain to 4/10 Baseline: 8/10 Goal status: Ongoing  LONG TERM GOALS: Target date: 04/23/2023   Able to ascend 16 steps with LRAD and most appropriate pattern Baseline: 4 steps with B rails and step to pattern Goal status: INITIAL  2.  Increases L knee ROM to -5d extension and 120d flexion Baseline:  A/PROM Right eval Left eval  Hip flexion    Hip extension    Hip abduction    Hip adduction    Hip internal rotation    Hip external rotation    Knee flexion  75/80d  Knee extension  -25/-10d   Goal status: INITIAL  3.  Increase L knee strength to 4/5 Baseline:  MMT Right eval Left eval  Hip flexion    Hip extension    Hip abduction    Hip adduction    Hip internal rotation    Hip external rotation    Knee flexion  3  Knee extension  3-   Goal status: INITIAL  4.  579ft ambulation with LRAD Baseline: 61ft with RW Goal status: INITIAL  5.  Increase FOTO score to 61 Baseline: 55 03/23/2023: 47% functional status Goal status: ONGOING  6.  Increase reps to 6 on 30s chair stand test Baseline: 1 rep Goal  status: INITIAL   PLAN: PT FREQUENCY: 1-2x/week  PT DURATION: 8 weeks  PLANNED INTERVENTIONS: Therapeutic exercises, Therapeutic activity, Neuromuscular re-education, Balance training, Gait training, Patient/Family education, Self Care, Joint mobilization, Stair training, DME instructions, Dry Needling, Electrical stimulation, Cryotherapy, Moist heat, scar mobilization, Manual therapy, and Re-evaluation  PLAN FOR NEXT SESSION: HEP review and update, manual techniques as appropriate, aerobic tasks, ROM and flexibility activities, strengthening and PREs, TPDN, gait and balance training as needed    Hildred Laser PT 04/06/23  12:54 PM Phone: [phone] Fax: [phone]

## 2023-04-07 ENCOUNTER — Encounter: Payer: Self-pay | Admitting: Family Medicine

## 2023-04-07 ENCOUNTER — Ambulatory Visit: Payer: Medicaid Other

## 2023-04-07 DIAGNOSIS — M25562 Pain in left knee: Secondary | ICD-10-CM

## 2023-04-07 DIAGNOSIS — M6281 Muscle weakness (generalized): Secondary | ICD-10-CM

## 2023-04-07 DIAGNOSIS — R600 Localized edema: Secondary | ICD-10-CM

## 2023-04-07 DIAGNOSIS — R2689 Other abnormalities of gait and mobility: Secondary | ICD-10-CM

## 2023-04-08 MED ORDER — MOUNJARO 2.5 MG/0.5ML ~~LOC~~ SOAJ: SUBCUTANEOUS | 0 refills | Status: DC

## 2023-04-08 NOTE — Therapy (Deleted)
OUTPATIENT PHYSICAL THERAPY TREATMENT   Patient Name: Christine Barrett MRN: [id number] DOB:1961-04-21, 62 y.o., female Today's Date: 04/08/2023   END OF SESSION:    Past Medical History:  Diagnosis Date   Allergy    SEASONAL   Anemia    Arthritis    CAD S/P percutaneous coronary angioplasty 09/2013   a) Ostial AV G Cx - 2.5 mm Angiosculpt; mid LAD 40-60%; b) Myoview 07/2014: LOW RISK, small-severe fixed inferior defect c/w infarct w/o peri-infarct ischemia.   Chronic bronchitis (HCC)    "frequently; not q yr" (10/03/2013)   Diabetes mellitus without complication (HCC)    Type 2-diet controlled.    Diverticulosis    Essential hypertension    with prior Accelerated HTN   GERD (gastroesophageal reflux disease)    Hiatal hernia    Hx of non-ST elevation myocardial infarction (NSTEMI) 10/01/2013   Due to Accelerated HTN with existing CAD   Hyperlipemia    Migraine    "@ least once/month" (10/03/2013)   Myocardial infarction (HCC)    OSA (obstructive sleep apnea) 06/10/2016   Schatzki's ring    Seasonal allergies    Seizure (HCC)    1 in elementary school, never knew what caused it   Sinusitis    Spinal headache    during C-section of second child.    Past Surgical History:  Procedure Laterality Date   ABDOMINAL HYSTERECTOMY  1994   "partial"   CAPSULAR RELEASE Right 06/06/2020   Procedure: RIGHT SHOULDER MANIPULATION UNDER ANESTHESIA, ROTATOR CUFF REPAIR;  Surgeon: Cammy Copa, MD;  Location: [facility] OR;  Service: Orthopedics;  Laterality: Right;   CARDIAC CATHETERIZATION N/A 08/05/2015   Procedure: Left Heart Cath and Coronary Angiography;  Surgeon: Marykay Lex, MD;  Location: [facility] INVASIVE CV LAB;  Service: Cardiovascular;  Laterality: N/A;   CESAREAN SECTION  1989   COLONOSCOPY     CORONARY ANGIOPLASTY  10/03/2013   95% ostial AV G Cx - 2.5 mm AngioSculpt Balloon PTCA; mid LAD 40-60%   LEFT HEART CATHETERIZATION WITH CORONARY ANGIOGRAM N/A 10/02/2013   Procedure:  LEFT HEART CATHETERIZATION WITH CORONARY ANGIOGRAM;  Surgeon: Lennette Bihari, MD;  Location: [facility] CATH LAB;  Service: Cardiovascular;  Laterality: N/A;   NASAL SEPTOPLASTY W/ TURBINOPLASTY  ~ 2007   NM MYOVIEW LTD  08/22/2014    Low risk stress nuclear study with a small, severe, fixed defect in the distal inferior wall/apex suggestive of small prior infarct; no ischemia.  LV Wall Motion:  NL LV Function; NL Wall Motion   PERCUTANEOUS CORONARY STENT INTERVENTION (PCI-S) N/A 10/03/2013   cutting balloon angioplasty only no stent.    SINOSCOPY     TOTAL KNEE ARTHROPLASTY Left 02/02/2023   Procedure: LEFT TOTAL KNEE ARTHROPLASTY;  Surgeon: Cammy Copa, MD;  Location: [facility] OR;  Service: Orthopedics;  Laterality: Left;   TRANSTHORACIC ECHOCARDIOGRAM  10/02/2013   EF 55-60%; mild LVH, no RWMA,    TUBAL LIGATION  1989   Patient Active Problem List   Diagnosis Date Noted   Arthritis of left knee 02/14/2023   OA (osteoarthritis) of knee 02/02/2023   Status post total knee replacement, left 02/02/2023   Coronary artery disease involving native heart with angina pectoris, unspecified vessel or lesion type (HCC) 10/29/2021   Perineal irritation in female 08/15/2021   Dysuria 08/15/2021   Plantar fasciitis of right foot 07/04/2021   Trochanteric bursitis of left hip 04/08/2021   Pain of left middle finger 04/08/2021   Other allergic rhinitis  01/01/2021   Frequent episodes of sinusitis 01/01/2021   History of bronchitis 01/01/2021   Allergic conjunctivitis of both eyes 01/01/2021   Family history of breast cancer in sister 09/20/2020   Type 2 diabetes mellitus with other circulatory complications, HTN, CVD (HCC) 05/17/2020   Morbid obesity (HCC) 05/17/2020   BMI 40.0-44.9, adult (HCC) 05/17/2020   Acute non-recurrent maxillary sinusitis 11/04/2016   Acute pain of both knees 08/28/2016   OSA (obstructive sleep apnea) 06/10/2016   Costochondritis 06/09/2016   Bilateral leg cramps 02/07/2016    Peripheral edema 01/29/2016   Hypertension associated with diabetes (HCC)    GERD (gastroesophageal reflux disease) 08/10/2014   Vaginal dryness, menopausal 08/10/2014   CAD S/P percutaneous coronary angioplasty - Ostial AVG Cx - Scoring balloon PTCA 10/03/2013   Hyperlipidemia with target LDL less than 70 10/02/2013   H/O non-ST elevation myocardial infarction (NSTEMI) 10/02/2013   Normocytic anemia, not due to blood loss    Allergic sinusitis 03/17/2007   Migraine without aura 03/16/2007    PCP: Excell Seltzer, MD   REFERRING PROVIDER: Julieanne Cotton, PA-C  REFERRING DIAG: [phone] (ICD-10-CM) - Status post total knee replacement, left  THERAPY DIAG:  No diagnosis found.  Rationale for Evaluation and Treatment: Rehabilitation  ONSET DATE: 02/02/23 DOS   SUBJECTIVE:  SUBJECTIVE STATEMENT:  Patient reports stiffness in her knee today and states that previous session she had increased soreness afterwards.   PERTINENT HISTORY: See PMH above  PAIN:  Are you having pain? Yes:  NPRS scale: 7/10 Pain location: Left knee Pain description: Sore, stiff Aggravating factors: ROM, activity Relieving factors: Rest, ice, heat  PRECAUTIONS: Knee  PATIENT GOALS: To regain the function in my L knee   OBJECTIVE:  PATIENT SURVEYS:   FOTO: 55 (61 predicted)   03/23/2023: 47% functional status  MUSCLE LENGTH: deferred  LOWER EXTREMITY ROM:  A/PROM Right eval Left eval Left 03/05/2023 Left 03/09/23 Left 03/18/23 Left 03/26/2023 Left  03/31/23  Hip flexion         Hip extension         Hip abduction         Hip adduction         Hip internal rotation         Hip external rotation         Knee flexion  75/80d 85 AROM / 90 PROM 87 PROM 94 AAROM strap 97 AAROM 98 AAROM  Knee extension  -25/-10d -6 AROM -5 AROM -5 AROM Lacking 3   Ankle dorsiflexion         Ankle plantarflexion         Ankle inversion         Ankle eversion          (Blank rows = not tested)  LOWER  EXTREMITY MMT:  MMT Right eval Left eval L 04/02/23  Hip flexion     Hip extension     Hip abduction     Hip adduction     Hip internal rotation     Hip external rotation     Knee flexion  3 3+  Knee extension  3- 3  Ankle dorsiflexion     Ankle plantarflexion     Ankle inversion     Ankle eversion      (Blank rows = not tested)  FUNCTIONAL TESTS:  30s chair stand 1 rep  GAIT: Distance walked: 43ft x2 Assistive device utilized: Walker - 2 wheeled Level of assistance: Modified  independence Comments: slow cadence with step through pattern   TODAY'S TREATMENT:  [facility] Adult PT Treatment:                                                DATE: 04/07/23 Therapeutic Exercise: Recumbent bike partial revolutions x 5 min to work on knee flexion SLR L with QS 15x SAQs 3# 15x2  Heel slides with strap over slide board 15x 85d initial, 100 final FAQs with adduction 15x TKEs GTB x10 Heel raises over 4 in step 2x10 Modalities: Cold pack 10 min post session (held vaso to quantify post treatment soreness)   [facility] Adult PT Treatment:                                                DATE: 04/02/23 Therapeutic Exercise: SLR L with QS 15x SAQs 3# 15x2  Heel slides with strap over slide board 15x 95d initial, 104 final FAQs with adduction 15x TKEs GTB 15x Heel raises over 4 in step Modalities: Cold pack 10 min(held vaso to quantify post treatment soreness)  [facility] Adult PT Treatment:                                                DATE: 03/31/23 Therapeutic Exercise: Recumbent bike partial revolutions x 5 min to work on knee flexion Slant board calf stretch 2 x 30 sec Knee flexion stretch with foot on 10" step 5 x 10 sec Squat with BUE support at FM bar x 10 Seated hamstring stretch 2x30 sec Lt Modified thomas stretch with passive knee flexion 3 x 30 sec Supine heel slide with strap 10 x 5 sec Manual: Seated knee joint mobs and knee flexion PROM Supine patellar mobs all  directions Supine knee extension and flexion PROM Modalities: Vasopneumatic (Game Ready)   Location:  Left knee Time:  15 minutes Pressure:  Medium Temperature:  34 degrees    PATIENT EDUCATION:  Education details: HEP Person educated: Patient Education method: Explanation Education comprehension: verbalized understanding and needs further education  HOME EXERCISE PROGRAM: Access Code: [email or web address]   ASSESSMENT: CLINICAL IMPRESSION:  Patient presents to PT reporting increased stiffness and soreness in her Lt knee. Session today continued to focus on quad contraction and knee ROM. Able to obtain 100 flexion today. Patient continues to benefit from skilled PT services and should be progressed as able to improve functional independence.     OBJECTIVE IMPAIRMENTS: Abnormal gait, decreased activity tolerance, decreased coordination, decreased endurance, decreased knowledge of condition, decreased knowledge of use of DME, decreased mobility, difficulty walking, decreased ROM, decreased strength, improper body mechanics, and pain.   ACTIVITY LIMITATIONS: carrying, lifting, standing, squatting, and stairs  PERSONAL FACTORS: Age, Fitness, Past/current experiences, and 1 comorbidity: DM  are also affecting patient's functional outcome.    GOALS: Goals reviewed with patient? No  SHORT TERM GOALS: Target date: 03/26/2023   Patient to demonstrate independence in HEP  Baseline: [email or web address] Goal status: Met  2.  Increase L knee AROM to 90d flexion and -10d extension Baseline:  A/PROM Right eval Left eval  Hip flexion  Hip extension    Hip abduction    Hip adduction    Hip internal rotation    Hip external rotation    Knee flexion  75/80d  Knee extension  -25/-10d   03/26/2023: see above Goal status: MET  3.  Decrease pain to 4/10 Baseline: 8/10 Goal status: Ongoing  LONG TERM GOALS: Target date: 04/23/2023   Able to ascend 16 steps with LRAD and most appropriate  pattern Baseline: 4 steps with B rails and step to pattern Goal status: INITIAL  2.  Increases L knee ROM to -5d extension and 120d flexion Baseline:  A/PROM Right eval Left eval  Hip flexion    Hip extension    Hip abduction    Hip adduction    Hip internal rotation    Hip external rotation    Knee flexion  75/80d  Knee extension  -25/-10d   Goal status: INITIAL  3.  Increase L knee strength to 4/5 Baseline:  MMT Right eval Left eval  Hip flexion    Hip extension    Hip abduction    Hip adduction    Hip internal rotation    Hip external rotation    Knee flexion  3  Knee extension  3-   Goal status: INITIAL  4.  571ft ambulation with LRAD Baseline: 86ft with RW Goal status: INITIAL  5.  Increase FOTO score to 61 Baseline: 55 03/23/2023: 47% functional status Goal status: ONGOING  6.  Increase reps to 6 on 30s chair stand test Baseline: 1 rep Goal status: INITIAL   PLAN: PT FREQUENCY: 1-2x/week  PT DURATION: 8 weeks  PLANNED INTERVENTIONS: Therapeutic exercises, Therapeutic activity, Neuromuscular re-education, Balance training, Gait training, Patient/Family education, Self Care, Joint mobilization, Stair training, DME instructions, Dry Needling, Electrical stimulation, Cryotherapy, Moist heat, scar mobilization, Manual therapy, and Re-evaluation  PLAN FOR NEXT SESSION: HEP review and update, manual techniques as appropriate, aerobic tasks, ROM and flexibility activities, strengthening and PREs, TPDN, gait and balance training as needed    Hildred Laser PT 04/08/23  11:57 AM Phone: [phone] Fax: [phone]

## 2023-04-09 ENCOUNTER — Encounter: Payer: Self-pay | Admitting: Nurse Practitioner

## 2023-04-09 ENCOUNTER — Ambulatory Visit: Payer: Medicaid Other | Admitting: Nurse Practitioner

## 2023-04-09 ENCOUNTER — Ambulatory Visit: Payer: Medicaid Other

## 2023-04-09 VITALS — BP 118/70 | HR 90 | Temp 98.3°F | Ht 68.5 in | Wt 267.0 lb

## 2023-04-09 DIAGNOSIS — J069 Acute upper respiratory infection, unspecified: Secondary | ICD-10-CM | POA: Diagnosis not present

## 2023-04-09 MED ORDER — DOXYCYCLINE HYCLATE 100 MG PO TABS: 100.0000 mg | ORAL_TABLET | Freq: Two times a day (BID) | ORAL | 0 refills | Status: AC

## 2023-04-09 NOTE — Progress Notes (Signed)
Established Patient Office Visit  Subjective:  Patient ID: Christine Barrett, female    DOB: June 17, 1961  Age: 62 y.o. MRN: [id number]  CC:  Chief Complaint  Patient presents with   Sinus Problem    Started Wednesday, coughing up yellow phlegm.  Has been taking sudafed and it has helped .     HPI  Christine Barrett presents for:  Cough This is a new problem. The current episode started in the past 7 days. The problem has been unchanged. The problem occurs hourly. The cough is Productive of sputum. Associated symptoms include nasal congestion and postnasal drip. Pertinent negatives include no ear congestion, fever, headaches or sore throat. Associated symptoms comments: Voice horasen. The symptoms are aggravated by lying down. She has tried OTC cough suppressant (Sudafed) for the symptoms. The treatment provided mild relief.     Past Medical History:  Diagnosis Date   Allergy    SEASONAL   Anemia    Arthritis    CAD S/P percutaneous coronary angioplasty 09/2013   a) Ostial AV G Cx - 2.5 mm Angiosculpt; mid LAD 40-60%; b) Myoview 07/2014: LOW RISK, small-severe fixed inferior defect c/w infarct w/o peri-infarct ischemia.   Chronic bronchitis (HCC)    "frequently; not q yr" (10/03/2013)   Diabetes mellitus without complication (HCC)    Type 2-diet controlled.    Diverticulosis    Essential hypertension    with prior Accelerated HTN   GERD (gastroesophageal reflux disease)    Hiatal hernia    Hx of non-ST elevation myocardial infarction (NSTEMI) 10/01/2013   Due to Accelerated HTN with existing CAD   Hyperlipemia    Migraine    "@ least once/month" (10/03/2013)   Myocardial infarction (HCC)    OSA (obstructive sleep apnea) 06/10/2016   Schatzki's ring    Seasonal allergies    Seizure (HCC)    1 in elementary school, never knew what caused it   Sinusitis    Spinal headache    during C-section of second child.     Past Surgical History:  Procedure Laterality Date   ABDOMINAL  HYSTERECTOMY  1994   "partial"   CAPSULAR RELEASE Right 06/06/2020   Procedure: RIGHT SHOULDER MANIPULATION UNDER ANESTHESIA, ROTATOR CUFF REPAIR;  Surgeon: Cammy Copa, MD;  Location: [facility] OR;  Service: Orthopedics;  Laterality: Right;   CARDIAC CATHETERIZATION N/A 08/05/2015   Procedure: Left Heart Cath and Coronary Angiography;  Surgeon: Marykay Lex, MD;  Location: [facility] INVASIVE CV LAB;  Service: Cardiovascular;  Laterality: N/A;   CESAREAN SECTION  1989   COLONOSCOPY     CORONARY ANGIOPLASTY  10/03/2013   95% ostial AV G Cx - 2.5 mm AngioSculpt Balloon PTCA; mid LAD 40-60%   LEFT HEART CATHETERIZATION WITH CORONARY ANGIOGRAM N/A 10/02/2013   Procedure: LEFT HEART CATHETERIZATION WITH CORONARY ANGIOGRAM;  Surgeon: Lennette Bihari, MD;  Location: [facility] CATH LAB;  Service: Cardiovascular;  Laterality: N/A;   NASAL SEPTOPLASTY W/ TURBINOPLASTY  ~ 2007   NM MYOVIEW LTD  08/22/2014    Low risk stress nuclear study with a small, severe, fixed defect in the distal inferior wall/apex suggestive of small prior infarct; no ischemia.  LV Wall Motion:  NL LV Function; NL Wall Motion   PERCUTANEOUS CORONARY STENT INTERVENTION (PCI-S) N/A 10/03/2013   cutting balloon angioplasty only no stent.    SINOSCOPY     TOTAL KNEE ARTHROPLASTY Left 02/02/2023   Procedure: LEFT TOTAL KNEE ARTHROPLASTY;  Surgeon: Cammy Copa, MD;  Location:  [facility] OR;  Service: Orthopedics;  Laterality: Left;   TRANSTHORACIC ECHOCARDIOGRAM  10/02/2013   EF 55-60%; mild LVH, no RWMA,    TUBAL LIGATION  1989    Family History  Adopted: Yes  Problem Relation Age of Onset   Allergic rhinitis Mother    Diabetes Mother    Sinusitis Mother    Breast cancer Sister    Hypertension Sister    Colon cancer Neg Hx    Heart attack Neg Hx    Stroke Neg Hx     Social History   Socioeconomic History   Marital status: Divorced    Spouse name: Molly Maduro   Number of children: 2   Years of education: Not on file   Highest  education level: Not on file  Occupational History   Occupation: Biomedical engineer  Tobacco Use   Smoking status: Never    Passive exposure: Never   Smokeless tobacco: Never  Vaping Use   Vaping status: Never Used  Substance and Sexual Activity   Alcohol use: Not Currently    Comment: occ   Drug use: No   Sexual activity: Yes    Birth control/protection: Surgical  Other Topics Concern   Not on file  Social History Narrative   She is a married mother of 2, grandmother 2. Her current marriage is than a pack years.    She work as an Systems analyst -  For [employer]   She has completed 3 years of college. She never smoked and does not drink All. She does do time and balancing exercises but does not do routine of exercise.   She herself is adopted.   Social Determinants of Health   Financial Resource Strain: Not on file  Food Insecurity: No Food Insecurity (02/15/2023)   Hunger Vital Sign    Worried About Running Out of Food in the Last Year: Never true    Ran Out of Food in the Last Year: Never true  Transportation Needs: No Transportation Needs (02/15/2023)   PRAPARE - Administrator, Civil Service (Medical): No    Lack of Transportation (Non-Medical): No  Physical Activity: Not on file  Stress: Not on file  Social Connections: Not on file  Intimate Partner Violence: Not on file     Outpatient Medications Prior to Visit  Medication Sig Dispense Refill   acetaminophen (TYLENOL) 325 MG tablet Take 1-2 tablets (325-650 mg total) by mouth every 6 (six) hours as needed for mild pain (pain score 1-3 or temp > 100.5). 60 tablet 0   albuterol (VENTOLIN HFA) 108 (90 Base) MCG/ACT inhaler INHALE 2 PUFFS BY MOUTH EVERY 6 HOURS AS NEEDED FOR WHEEZING OR  SHORTNESS  OF  BREATH 18 g 0   amLODipine (NORVASC) 5 MG tablet Take 1 tablet (5 mg total) by mouth daily. 180 tablet 1   aspirin 81 MG chewable tablet Chew 1 tablet (81 mg total) by mouth 2 (two)  times daily. 60 tablet 0   BLACK ELDERBERRY PO Take 2 each by mouth daily.     Blood Glucose Monitoring Suppl (ONETOUCH VERIO) w/Device KIT Use to check blood sugar 2 times a day 1 kit 0   carvedilol (COREG) 25 MG tablet TAKE 1 TABLET BY MOUTH TWICE DAILY WITH A MEAL 180 tablet 1   celecoxib (CELEBREX) 100 MG capsule Take 1 capsule (100 mg total) by mouth 2 (two) times daily. 40 capsule 0   Cholecalciferol (VITAMIN D3 PO) Take 1 tablet  by mouth daily.     ezetimibe (ZETIA) 10 MG tablet Take 1 tablet (10 mg total) by mouth daily. 90 tablet 3   famotidine (PEPCID) 20 MG tablet Take 1 tablet (20 mg total) by mouth daily. 90 tablet 1   fluticasone (FLONASE) 50 MCG/ACT nasal spray Place 2 sprays into both nostrils daily as needed for allergies or rhinitis.     glucose blood (ONETOUCH VERIO) test strip Use to check blood sugar 2 times a day 200 strip 3   hydrochlorothiazide (HYDRODIURIL) 25 MG tablet TAKE 1 TABLET DAILY 90 tablet 3   isosorbide mononitrate (IMDUR) 60 MG 24 hr tablet Take 2 tablets (120 mg total) by mouth daily. 180 tablet 0   Lancet Devices (ONE TOUCH DELICA LANCING DEV) MISC Use to check blood sugar 2 times a day 1 each 0   Lancets (ONETOUCH DELICA PLUS LANCET30G) MISC USE TO CHECK BLOOD SUGAR 2 TIMES A DAY 200 each 3   latanoprost (XALATAN) 0.005 % ophthalmic solution Place 1 drop into both eyes at bedtime.      levocetirizine (XYZAL) 5 MG tablet Take 5 mg by mouth daily.     losartan (COZAAR) 100 MG tablet Take 1 tablet (100 mg total) by mouth daily. 90 tablet 1   methocarbamol (ROBAXIN) 500 MG tablet Take 1 tablet (500 mg total) by mouth every 8 (eight) hours as needed for muscle spasms. 30 tablet 1   Multiple Vitamin (MULTIVITAMIN WITH MINERALS) TABS tablet Take 1 tablet by mouth daily.     rosuvastatin (CRESTOR) 20 MG tablet Take 1 tablet (20 mg total) by mouth daily. 90 tablet 3   timolol (TIMOPTIC) 0.5 % ophthalmic solution Place 1 drop into both eyes every morning.      tirzepatide (MOUNJARO) 2.5 MG/0.5ML Pen INJECT 2.5 MG SUBCUTANEOUSLY WEEKLY 6 mL 0   zinc gluconate 50 MG tablet Take 50 mg by mouth daily.     docusate sodium (COLACE) 100 MG capsule Take 1 capsule (100 mg total) by mouth 2 (two) times daily. (Patient not taking: Reported on 02/15/2023) 10 capsule 0   HYDROmorphone (DILAUDID) 2 MG tablet Take 0.5-1 tablets (1-2 mg total) by mouth every 6 (six) hours as needed for severe pain. (Patient not taking: Reported on 03/04/2023) 30 tablet 0   montelukast (SINGULAIR) 10 MG tablet Take 1 tablet (10 mg total) by mouth at bedtime. 90 tablet 3   Multiple Vitamins-Minerals (EMERGEN-C IMMUNE) PACK Take 1 packet by mouth daily as needed (during cold season). (Patient not taking: Reported on 02/15/2023)     ondansetron (ZOFRAN) 4 MG tablet Take 1 tablet (4 mg total) by mouth every 6 (six) hours as needed for nausea. (Patient not taking: Reported on 04/09/2023) 20 tablet 0   pantoprazole (PROTONIX) 40 MG tablet TAKE 1 TABLET DAILY (Patient not taking: Reported on 04/09/2023) 90 tablet 3   No facility-administered medications prior to visit.    Allergies  Allergen Reactions   Lactose Intolerance (Gi)    Nickel Rash    Nickel jewlery causes bumps and rash on skin   Penicillins Rash    Pt states she has taken since intial  reaction without recurrence.  Has patient had a PCN reaction causing immediate rash, facial/tongue/throat swelling, SOB or lightheadedness with hypotension:  no Has patient had a PCN reaction causing severe rash involving mucus membranes or skin necrosis: no Has patient had a PCN reaction that required hospitalization no Has patient had a PCN reaction occurring within the last 10 years:  no If all of the above answers are "NO", then may proceed with Cephalospori    ROS Review of Systems  Constitutional:  Negative for fever.  HENT:  Positive for postnasal drip. Negative for sore throat.   Respiratory:  Positive for cough.   Neurological:   Negative for headaches.   Negative unless indicated in HPI.    Objective:    Physical Exam HENT:     Right Ear: A middle ear effusion is present.     Left Ear: A middle ear effusion is present.     Nose: Congestion present.     Right Turbinates: Not enlarged.     Left Turbinates: Not enlarged.     Right Sinus: No maxillary sinus tenderness or frontal sinus tenderness.     Left Sinus: No maxillary sinus tenderness or frontal sinus tenderness.     Mouth/Throat:     Mouth: Mucous membranes are moist.     Pharynx: Posterior oropharyngeal erythema (slight) and postnasal drip present. No pharyngeal swelling or oropharyngeal exudate.     Tonsils: No tonsillar exudate.  Cardiovascular:     Rate and Rhythm: Normal rate and regular rhythm.  Pulmonary:     Effort: Pulmonary effort is normal.     Breath sounds: Normal breath sounds. No stridor. No wheezing.  Neurological:     General: No focal deficit present.     Mental Status: She is oriented to person, place, and time. Mental status is at baseline.  Psychiatric:        Mood and Affect: Mood normal.        Behavior: Behavior normal.        Thought Content: Thought content normal.        Judgment: Judgment normal.     BP 118/70 (BP Location: Left Arm, Patient Position: Sitting, Cuff Size: Normal)   Pulse 90   Temp 98.3 F (36.8 C) (Oral)   Ht 5' 8.5" (1.74 m)   Wt 267 lb (121.1 kg)   SpO2 92%   BMI 40.01 kg/m  Wt Readings from Last 3 Encounters:  04/09/23 267 lb (121.1 kg)  02/10/23 272 lb (123.4 kg)  02/02/23 270 lb (122.5 kg)     Health Maintenance  Topic Date Due   Zoster Vaccines- Shingrix (1 of 2) Never done   COVID-19 Vaccine (4 - 2023-24 season) 04/25/2023 (Originally 02/28/2023)   INFLUENZA VACCINE  09/27/2023 (Originally 01/28/2023)   HEMOGLOBIN A1C  07/25/2023   OPHTHALMOLOGY EXAM  10/22/2023   Diabetic kidney evaluation - Urine ACR  11/13/2023   FOOT EXAM  11/20/2023   Diabetic kidney evaluation - eGFR  measurement  01/22/2024   MAMMOGRAM  09/30/2024   Colonoscopy  11/04/2024   DTaP/Tdap/Td (3 - Td or Tdap) 01/08/2028   Hepatitis C Screening  Completed   HIV Screening  Completed   HPV VACCINES  Aged Out    There are no preventive care reminders to display for this patient.  Lab Results  Component Value Date   TSH 1.67 01/09/2020   Lab Results  Component Value Date   WBC 7.6 01/22/2023   HGB 12.3 01/22/2023   HCT 39.5 01/22/2023   MCV 84.2 01/22/2023   PLT 231 01/22/2023   Lab Results  Component Value Date   NA 143 01/22/2023   K 3.9 01/22/2023   CO2 26 01/22/2023   GLUCOSE 135 (H) 01/22/2023   BUN 10 01/22/2023   CREATININE 0.77 01/22/2023   BILITOT 0.7 11/13/2022   ALKPHOS 82  11/13/2022   AST 17 11/13/2022   ALT 16 11/13/2022   PROT 7.7 11/13/2022   ALBUMIN 4.3 11/13/2022   CALCIUM 9.3 01/22/2023   ANIONGAP 10 01/22/2023   GFR 75.19 11/13/2022   Lab Results  Component Value Date   CHOL 248 (H) 11/13/2022   Lab Results  Component Value Date   HDL 48.30 11/13/2022   Lab Results  Component Value Date   LDLCALC 165 (H) 11/13/2022   Lab Results  Component Value Date   TRIG 173.0 (H) 11/13/2022   Lab Results  Component Value Date   CHOLHDL 5 11/13/2022   Lab Results  Component Value Date   HGBA1C 6.7 (H) 01/22/2023      Assessment & Plan:  Upper respiratory tract infection, unspecified type Assessment & Plan: Nasal congestion and hoarsen of voice.  No adventitious sounds to auscultation.  Prescribed doxycycline if the pt is not feeling better she will start the medication in 2-3 days.  Medication side effect discussed. Continue sudafed and use salt water gargles.   Other orders -     Doxycycline Hyclate; Take 1 tablet (100 mg total) by mouth 2 (two) times daily for 7 days.  Dispense: 14 tablet; Refill: 0    Follow-up: Return if symptoms worsen or fail to improve.   Kara Dies, NP

## 2023-04-09 NOTE — Assessment & Plan Note (Addendum)
Nasal congestion and hoarsen of voice.  No adventitious sounds to auscultation.  Prescribed doxycycline if the pt is not feeling better she will start the medication in 2-3 days.  Medication side effect discussed. Continue sudafed and use salt water gargles.

## 2023-04-09 NOTE — Patient Instructions (Addendum)
Advised to perform salt water gargles and continue sudafed. If the symptoms are not improving in 2 days start doxycycline. Doxycycline should be taken with food to prevent nausea. Should not lie down for 30 minutes after taking the medication. Be cautious with sun exposure     Upper Respiratory Infection, Adult An upper respiratory infection (URI) affects the nose, throat, and upper airways that lead to the lungs. The most common type of URI is often called the common cold. URIs usually get better on their own, without medical treatment. What are the causes? A URI is caused by a germ (virus). You may catch these germs by: Breathing in droplets from an infected person's cough or sneeze. Touching something that has the germ on it (is contaminated) and then touching your mouth, nose, or eyes. What increases the risk? You are more likely to get a URI if: You are very young or very old. You have close contact with others, such as at work, school, or a health care facility. You smoke. You have long-term (chronic) heart or lung disease. You have a weakened disease-fighting system (immune system). You have nasal allergies or asthma. You have a lot of stress. You have poor nutrition. What are the signs or symptoms? Runny or stuffy (congested) nose. Cough. Sneezing. Sore throat. Headache. Feeling tired (fatigue). Fever. Not wanting to eat as much as usual. Pain in your forehead, behind your eyes, and over your cheekbones (sinus pain). Muscle aches. Redness or irritation of the eyes. Pressure in the ears or face. How is this treated? URIs usually get better on their own within 7-10 days. Medicines cannot cure URIs, but your doctor may recommend certain medicines to help relieve symptoms, such as: Over-the-counter cold medicines. Medicines to reduce coughing (cough suppressants). Coughing is a type of defense against infection that helps to clear the nose, throat, windpipe, and lungs  (respiratory system). Take these medicines only as told by your doctor. Medicines to lower your fever. Follow these instructions at home: Activity Rest as needed. If you have a fever, stay home from work or school until your fever is gone, or until your doctor says you may return to work or school. You should stay home until you cannot spread the infection anymore (you are not contagious). Your doctor may have you wear a face mask so you have less risk of spreading the infection. Relieving symptoms Rinse your mouth often with salt water. To make salt water, dissolve -1 tsp (3-6 g) of salt in 1 cup (237 mL) of warm water. Use a cool-mist humidifier to add moisture to the air. This can help you breathe more easily. Eating and drinking  Drink enough fluid to keep your pee (urine) pale yellow. Eat soups and other clear broths. General instructions  Take over-the-counter and prescription medicines only as told by your doctor. Do not smoke or use any products that contain nicotine or tobacco. If you need help quitting, ask your doctor. Avoid being where people are smoking (avoid secondhand smoke). Stay up to date on all your shots (immunizations), and get the flu shot every year. Keep all follow-up visits. How to prevent the spread of infection to others  Wash your hands with soap and water for at least 20 seconds. If you cannot use soap and water, use hand sanitizer. Avoid touching your mouth, face, eyes, or nose. Cough or sneeze into a tissue or your sleeve or elbow. Do not cough or sneeze into your hand or into the air. Contact  a doctor if: You are getting worse, not better. You have any of these: A fever or chills. Brown or red mucus in your nose. Yellow or brown fluid (discharge)coming from your nose. Pain in your face, especially when you bend forward. Swollen neck glands. Pain when you swallow. White areas in the back of your throat. Get help right away if: You have shortness  of breath that gets worse. You have very bad or constant: Headache. Ear pain. Pain in your forehead, behind your eyes, and over your cheekbones (sinus pain). Chest pain. You have long-lasting (chronic) lung disease along with any of these: Making high-pitched whistling sounds when you breathe, most often when you breathe out (wheezing). Long-lasting cough (more than 14 days). Coughing up blood. A change in your usual mucus. You have a stiff neck. You have changes in your: Vision. Hearing. Thinking. Mood. These symptoms may be an emergency. Get help right away. Call 911. Do not wait to see if the symptoms will go away. Do not drive yourself to the hospital. Summary An upper respiratory infection (URI) is caused by a germ (virus). The most common type of URI is often called the common cold. URIs usually get better within 7-10 days. Take over-the-counter and prescription medicines only as told by your doctor. This information is not intended to replace advice given to you by your health care provider. Make sure you discuss any questions you have with your health care provider. Document Revised: 01/15/2021 Document Reviewed: 01/15/2021 Elsevier Patient Education  2024 [employer].

## 2023-04-15 ENCOUNTER — Ambulatory Visit: Payer: Medicaid Other

## 2023-04-15 DIAGNOSIS — R600 Localized edema: Secondary | ICD-10-CM

## 2023-04-15 DIAGNOSIS — M25662 Stiffness of left knee, not elsewhere classified: Secondary | ICD-10-CM

## 2023-04-15 DIAGNOSIS — M6281 Muscle weakness (generalized): Secondary | ICD-10-CM

## 2023-04-15 DIAGNOSIS — R2689 Other abnormalities of gait and mobility: Secondary | ICD-10-CM

## 2023-04-15 DIAGNOSIS — M25562 Pain in left knee: Secondary | ICD-10-CM

## 2023-04-15 NOTE — Therapy (Signed)
OUTPATIENT PHYSICAL THERAPY TREATMENT   Patient Name: Christine Barrett MRN: [id number] DOB:Nov 06, 1960, 62 y.o., female Today's Date: 04/15/2023   END OF SESSION:  PT End of Session - 04/15/23 0912     Visit Number 12    Number of Visits 16    Date for PT Re-Evaluation 04/28/23    Authorization Type [employer]    PT Start Time 0915    PT Stop Time 1005    PT Time Calculation (min) 50 min    Activity Tolerance Patient tolerated treatment well    Behavior During Therapy [facility] for tasks assessed/performed             Past Medical History:  Diagnosis Date   Allergy    SEASONAL   Anemia    Arthritis    CAD S/P percutaneous coronary angioplasty 09/2013   a) Ostial AV G Cx - 2.5 mm Angiosculpt; mid LAD 40-60%; b) Myoview 07/2014: LOW RISK, small-severe fixed inferior defect c/w infarct w/o peri-infarct ischemia.   Chronic bronchitis (HCC)    "frequently; not q yr" (10/03/2013)   Diabetes mellitus without complication (HCC)    Type 2-diet controlled.    Diverticulosis    Essential hypertension    with prior Accelerated HTN   GERD (gastroesophageal reflux disease)    Hiatal hernia    Hx of non-ST elevation myocardial infarction (NSTEMI) 10/01/2013   Due to Accelerated HTN with existing CAD   Hyperlipemia    Migraine    "@ least once/month" (10/03/2013)   Myocardial infarction (HCC)    OSA (obstructive sleep apnea) 06/10/2016   Schatzki's ring    Seasonal allergies    Seizure (HCC)    1 in elementary school, never knew what caused it   Sinusitis    Spinal headache    during C-section of second child.    Past Surgical History:  Procedure Laterality Date   ABDOMINAL HYSTERECTOMY  1994   "partial"   CAPSULAR RELEASE Right 06/06/2020   Procedure: RIGHT SHOULDER MANIPULATION UNDER ANESTHESIA, ROTATOR CUFF REPAIR;  Surgeon: Cammy Copa, MD;  Location: [facility] OR;  Service: Orthopedics;  Laterality: Right;   CARDIAC CATHETERIZATION N/A 08/05/2015   Procedure: Left Heart Cath and  Coronary Angiography;  Surgeon: Marykay Lex, MD;  Location: [facility] INVASIVE CV LAB;  Service: Cardiovascular;  Laterality: N/A;   CESAREAN SECTION  1989   COLONOSCOPY     CORONARY ANGIOPLASTY  10/03/2013   95% ostial AV G Cx - 2.5 mm AngioSculpt Balloon PTCA; mid LAD 40-60%   LEFT HEART CATHETERIZATION WITH CORONARY ANGIOGRAM N/A 10/02/2013   Procedure: LEFT HEART CATHETERIZATION WITH CORONARY ANGIOGRAM;  Surgeon: Lennette Bihari, MD;  Location: [facility] CATH LAB;  Service: Cardiovascular;  Laterality: N/A;   NASAL SEPTOPLASTY W/ TURBINOPLASTY  ~ 2007   NM MYOVIEW LTD  08/22/2014    Low risk stress nuclear study with a small, severe, fixed defect in the distal inferior wall/apex suggestive of small prior infarct; no ischemia.  LV Wall Motion:  NL LV Function; NL Wall Motion   PERCUTANEOUS CORONARY STENT INTERVENTION (PCI-S) N/A 10/03/2013   cutting balloon angioplasty only no stent.    SINOSCOPY     TOTAL KNEE ARTHROPLASTY Left 02/02/2023   Procedure: LEFT TOTAL KNEE ARTHROPLASTY;  Surgeon: Cammy Copa, MD;  Location: [facility] OR;  Service: Orthopedics;  Laterality: Left;   TRANSTHORACIC ECHOCARDIOGRAM  10/02/2013   EF 55-60%; mild LVH, no RWMA,    TUBAL LIGATION  1989   Patient Active  Problem List   Diagnosis Date Noted   Arthritis of left knee 02/14/2023   OA (osteoarthritis) of knee 02/02/2023   Status post total knee replacement, left 02/02/2023   Coronary artery disease involving native heart with angina pectoris, unspecified vessel or lesion type (HCC) 10/29/2021   Perineal irritation in female 08/15/2021   Dysuria 08/15/2021   Plantar fasciitis of right foot 07/04/2021   Trochanteric bursitis of left hip 04/08/2021   Pain of left middle finger 04/08/2021   Other allergic rhinitis 01/01/2021   Frequent episodes of sinusitis 01/01/2021   History of bronchitis 01/01/2021   Allergic conjunctivitis of both eyes 01/01/2021   Family history of breast cancer in sister 09/20/2020   Type 2  diabetes mellitus with other circulatory complications, HTN, CVD (HCC) 05/17/2020   Morbid obesity (HCC) 05/17/2020   BMI 40.0-44.9, adult (HCC) 05/17/2020   Acute non-recurrent maxillary sinusitis 11/04/2016   Acute pain of both knees 08/28/2016   OSA (obstructive sleep apnea) 06/10/2016   Costochondritis 06/09/2016   Bilateral leg cramps 02/07/2016   Peripheral edema 01/29/2016   Hypertension associated with diabetes (HCC)    GERD (gastroesophageal reflux disease) 08/10/2014   Vaginal dryness, menopausal 08/10/2014   CAD S/P percutaneous coronary angioplasty - Ostial AVG Cx - Scoring balloon PTCA 10/03/2013   Hyperlipidemia with target LDL less than 70 10/02/2013   H/O non-ST elevation myocardial infarction (NSTEMI) 10/02/2013   Normocytic anemia, not due to blood loss    Upper respiratory tract infection 03/26/2009   Allergic sinusitis 03/17/2007   Migraine without aura 03/16/2007    PCP: Excell Seltzer, MD   REFERRING PROVIDER: Julieanne Cotton, PA-C  REFERRING DIAG: [phone] (ICD-10-CM) - Status post total knee replacement, left  THERAPY DIAG:  Muscle weakness (generalized)  Other abnormalities of gait and mobility  Acute pain of left knee  Localized edema  Stiffness of left knee, not elsewhere classified  Rationale for Evaluation and Treatment: Rehabilitation  ONSET DATE: 02/02/23 DOS   SUBJECTIVE:  SUBJECTIVE STATEMENT:  Patient reports continued soreness in her knee, she has been dealing with an URI lately and has not been able to complete her exercises as much as she would like.   PERTINENT HISTORY: See PMH above  PAIN:  Are you having pain? Yes:  NPRS scale: 7/10 Pain location: Left knee Pain description: Sore, stiff Aggravating factors: ROM, activity Relieving factors: Rest, ice, heat  PRECAUTIONS: Knee  PATIENT GOALS: To regain the function in my L knee   OBJECTIVE:  PATIENT SURVEYS:   FOTO: 55 (61 predicted)   03/23/2023: 47% functional  status  MUSCLE LENGTH: deferred  LOWER EXTREMITY ROM:  A/PROM Right eval Left eval Left 03/05/2023 Left 03/09/23 Left 03/18/23 Left 03/26/2023 Left  03/31/23 Left 04/15/23  Hip flexion          Hip extension          Hip abduction          Hip adduction          Hip internal rotation          Hip external rotation          Knee flexion  75/80d 85 AROM / 90 PROM 87 PROM 94 AAROM strap 97 AAROM 98 AAROM 100 AAROM  Knee extension  -25/-10d -6 AROM -5 AROM -5 AROM Lacking 3    Ankle dorsiflexion          Ankle plantarflexion  Ankle inversion          Ankle eversion           (Blank rows = not tested)  LOWER EXTREMITY MMT:  MMT Right eval Left eval L 04/02/23  Hip flexion     Hip extension     Hip abduction     Hip adduction     Hip internal rotation     Hip external rotation     Knee flexion  3 3+  Knee extension  3- 3  Ankle dorsiflexion     Ankle plantarflexion     Ankle inversion     Ankle eversion      (Blank rows = not tested)  FUNCTIONAL TESTS:  30s chair stand 1 rep  GAIT: Distance walked: 86ft x2 Assistive device utilized: Environmental consultant - 2 wheeled Level of assistance: Modified independence Comments: slow cadence with step through pattern   TODAY'S TREATMENT:  [facility] Adult PT Treatment:                                                DATE: 04/15/23 Therapeutic Exercise: Recumbent bike partial revolutions x 5 min to work on knee flexion SLR L with QS to fatigue 20 reps SAQs 3# x15 Heel slides with strap over slide board 15x 92d initial, 100 final FAQs with adduction 15x TKEs GTB x10 Heel raises over 4 in step 2x10 Slant board gastroc stretch 2x30" Modalities: Pt req MHP at [facility] today, positioned in supine with bolster x10 mins post session     [facility] Adult PT Treatment:                                                DATE: 04/07/23 Therapeutic Exercise: Recumbent bike partial revolutions x 5 min to work on knee flexion SLR L with QS 15x SAQs 3#  15x2  Heel slides with strap over slide board 15x 85d initial, 100 final FAQs with adduction 15x TKEs GTB x10 Heel raises over 4 in step 2x10 Modalities: Cold pack 10 min post session (held vaso to quantify post treatment soreness)   [facility] Adult PT Treatment:                                                DATE: 04/02/23 Therapeutic Exercise: SLR L with QS 15x SAQs 3# 15x2  Heel slides with strap over slide board 15x 95d initial, 104 final FAQs with adduction 15x TKEs GTB 15x Heel raises over 4 in step Modalities: Cold pack 10 min(held vaso to quantify post treatment soreness)     PATIENT EDUCATION:  Education details: HEP Person educated: Patient Education method: Explanation Education comprehension: verbalized understanding and needs further education  HOME EXERCISE PROGRAM: Access Code: [email or web address]   ASSESSMENT: CLINICAL IMPRESSION:  Patient presents to PT reporting continued soreness in her knee and that she has not been compliant with her HEP lately due to dealing with an URI. Session today continued to focus on flexion ROM and quadriceps strengthening. She requested a MHP for her knee at [facility] today. Patient continues to benefit from skilled PT  services and should be progressed as able to improve functional independence.     OBJECTIVE IMPAIRMENTS: Abnormal gait, decreased activity tolerance, decreased coordination, decreased endurance, decreased knowledge of condition, decreased knowledge of use of DME, decreased mobility, difficulty walking, decreased ROM, decreased strength, improper body mechanics, and pain.   ACTIVITY LIMITATIONS: carrying, lifting, standing, squatting, and stairs  PERSONAL FACTORS: Age, Fitness, Past/current experiences, and 1 comorbidity: DM  are also affecting patient's functional outcome.    GOALS: Goals reviewed with patient? No  SHORT TERM GOALS: Target date: 03/26/2023   Patient to demonstrate independence in HEP  Baseline: [id number] Goal  status: Met  2.  Increase L knee AROM to 90d flexion and -10d extension Baseline:  A/PROM Right eval Left eval  Hip flexion    Hip extension    Hip abduction    Hip adduction    Hip internal rotation    Hip external rotation    Knee flexion  75/80d  Knee extension  -25/-10d   03/26/2023: see above Goal status: MET  3.  Decrease pain to 4/10 Baseline: 8/10 Goal status: Ongoing  LONG TERM GOALS: Target date: 04/23/2023   Able to ascend 16 steps with LRAD and most appropriate pattern Baseline: 4 steps with B rails and step to pattern Goal status: INITIAL  2.  Increases L knee ROM to -5d extension and 120d flexion Baseline:  A/PROM Right eval Left eval  Hip flexion    Hip extension    Hip abduction    Hip adduction    Hip internal rotation    Hip external rotation    Knee flexion  75/80d  Knee extension  -25/-10d   Goal status: INITIAL  3.  Increase L knee strength to 4/5 Baseline:  MMT Right eval Left eval  Hip flexion    Hip extension    Hip abduction    Hip adduction    Hip internal rotation    Hip external rotation    Knee flexion  3  Knee extension  3-   Goal status: INITIAL  4.  542ft ambulation with LRAD Baseline: 12ft with RW Goal status: INITIAL  5.  Increase FOTO score to 61 Baseline: 55 03/23/2023: 47% functional status Goal status: ONGOING  6.  Increase reps to 6 on 30s chair stand test Baseline: 1 rep Goal status: INITIAL   PLAN: PT FREQUENCY: 1-2x/week  PT DURATION: 8 weeks  PLANNED INTERVENTIONS: Therapeutic exercises, Therapeutic activity, Neuromuscular re-education, Balance training, Gait training, Patient/Family education, Self Care, Joint mobilization, Stair training, DME instructions, Dry Needling, Electrical stimulation, Cryotherapy, Moist heat, scar mobilization, Manual therapy, and Re-evaluation  PLAN FOR NEXT SESSION: HEP review and update, manual techniques as appropriate, aerobic tasks, ROM and flexibility  activities, strengthening and PREs, TPDN, gait and balance training as needed    Berta Minor PTA 04/15/23  9:56 AM Phone: [phone] Fax: [phone]

## 2023-04-21 ENCOUNTER — Ambulatory Visit: Payer: Medicaid Other

## 2023-04-22 ENCOUNTER — Ambulatory Visit: Payer: Medicaid Other

## 2023-04-22 DIAGNOSIS — M6281 Muscle weakness (generalized): Secondary | ICD-10-CM

## 2023-04-22 DIAGNOSIS — M25562 Pain in left knee: Secondary | ICD-10-CM | POA: Diagnosis not present

## 2023-04-22 DIAGNOSIS — R600 Localized edema: Secondary | ICD-10-CM

## 2023-04-22 DIAGNOSIS — R2689 Other abnormalities of gait and mobility: Secondary | ICD-10-CM

## 2023-04-22 NOTE — Therapy (Signed)
OUTPATIENT PHYSICAL THERAPY TREATMENT   Patient Name: Christine Barrett MRN: [id number] DOB:[birth year], 62 y.o., female Today's Date: 04/22/2023   END OF SESSION:  PT End of Session - 04/22/23 1658     Visit Number 13    Number of Visits 16    Date for PT Re-Evaluation 04/28/23    Authorization Type [employer]    PT Start Time 1700    PT Stop Time 1750    PT Time Calculation (min) 50 min    Activity Tolerance Patient tolerated treatment well    Behavior During Therapy [facility] for tasks assessed/performed              Past Medical History:  Diagnosis Date   Allergy    SEASONAL   Anemia    Arthritis    CAD S/P percutaneous coronary angioplasty 09/2013   a) Ostial AV G Cx - 2.5 mm Angiosculpt; mid LAD 40-60%; b) Myoview 07/2014: LOW RISK, small-severe fixed inferior defect c/w infarct w/o peri-infarct ischemia.   Chronic bronchitis (HCC)    "frequently; not q yr" (10/03/2013)   Diabetes mellitus without complication (HCC)    Type 2-diet controlled.    Diverticulosis    Essential hypertension    with prior Accelerated HTN   GERD (gastroesophageal reflux disease)    Hiatal hernia    Hx of non-ST elevation myocardial infarction (NSTEMI) 10/01/2013   Due to Accelerated HTN with existing CAD   Hyperlipemia    Migraine    "@ least once/month" (10/03/2013)   Myocardial infarction (HCC)    OSA (obstructive sleep apnea) 06/10/2016   Schatzki's ring    Seasonal allergies    Seizure (HCC)    1 in elementary school, never knew what caused it   Sinusitis    Spinal headache    during C-section of second child.    Past Surgical History:  Procedure Laterality Date   ABDOMINAL HYSTERECTOMY  1994   "partial"   CAPSULAR RELEASE Right 06/06/2020   Procedure: RIGHT SHOULDER MANIPULATION UNDER ANESTHESIA, ROTATOR CUFF REPAIR;  Surgeon: Cammy Copa, MD;  Location: [facility] OR;  Service: Orthopedics;  Laterality: Right;   CARDIAC CATHETERIZATION N/A 08/05/2015   Procedure: Left Heart Cath  and Coronary Angiography;  Surgeon: Marykay Lex, MD;  Location[facility] INVASIVE CV LAB;  Service: Cardiovascular;  Laterality: N/A;   CESAREAN SECTION  1989   COLONOSCOPY     CORONARY ANGIOPLASTY  10/03/2013   95% ostial AV G Cx - 2.5 mm AngioSculpt Balloon PTCA; mid LAD 40-60%   LEFT HEART CATHETERIZATION WITH CORONARY ANGIOGRAM N/A 10/02/2013   Procedure: LEFT HEART CATHETERIZATION WITH CORONARY ANGIOGRAM;  Surgeon: Lennette Bihari, MD;  Location: [facility] CATH LAB;  Service: Cardiovascular;  Laterality: N/A;   NASAL SEPTOPLASTY W/ TURBINOPLASTY  ~ 2007   NM MYOVIEW LTD  08/22/2014    Low risk stress nuclear study with a small, severe, fixed defect in the distal inferior wall/apex suggestive of small prior infarct; no ischemia.  LV Wall Motion:  NL LV Function; NL Wall Motion   PERCUTANEOUS CORONARY STENT INTERVENTION (PCI-S) N/A 10/03/2013   cutting balloon angioplasty only no stent.    SINOSCOPY     TOTAL KNEE ARTHROPLASTY Left 02/02/2023   Procedure: LEFT TOTAL KNEE ARTHROPLASTY;  Surgeon: Cammy Copa, MD;  Location: [facility] OR;  Service: Orthopedics;  Laterality: Left;   TRANSTHORACIC ECHOCARDIOGRAM  10/02/2013   EF 55-60%; mild LVH, no RWMA,    TUBAL LIGATION  1989   Patient  Active Problem List   Diagnosis Date Noted   Arthritis of left knee 02/14/2023   OA (osteoarthritis) of knee 02/02/2023   Status post total knee replacement, left 02/02/2023   Coronary artery disease involving native heart with angina pectoris, unspecified vessel or lesion type (HCC) 10/29/2021   Perineal irritation in female 08/15/2021   Dysuria 08/15/2021   Plantar fasciitis of right foot 07/04/2021   Trochanteric bursitis of left hip 04/08/2021   Pain of left middle finger 04/08/2021   Other allergic rhinitis 01/01/2021   Frequent episodes of sinusitis 01/01/2021   History of bronchitis 01/01/2021   Allergic conjunctivitis of both eyes 01/01/2021   Family history of breast cancer in sister 09/20/2020   Type  2 diabetes mellitus with other circulatory complications, HTN, CVD (HCC) 05/17/2020   Morbid obesity (HCC) 05/17/2020   BMI 40.0-44.9, adult (HCC) 05/17/2020   Acute non-recurrent maxillary sinusitis 11/04/2016   Acute pain of both knees 08/28/2016   OSA (obstructive sleep apnea) 06/10/2016   Costochondritis 06/09/2016   Bilateral leg cramps 02/07/2016   Peripheral edema 01/29/2016   Hypertension associated with diabetes (HCC)    GERD (gastroesophageal reflux disease) 08/10/2014   Vaginal dryness, menopausal 08/10/2014   CAD S/P percutaneous coronary angioplasty - Ostial AVG Cx - Scoring balloon PTCA 10/03/2013   Hyperlipidemia with target LDL less than 70 10/02/2013   H/O non-ST elevation myocardial infarction (NSTEMI) 10/02/2013   Normocytic anemia, not due to blood loss    Upper respiratory tract infection 03/26/2009   Allergic sinusitis 03/17/2007   Migraine without aura 03/16/2007    PCP: Excell Seltzer, MD   REFERRING PROVIDER: Julieanne Cotton, PA-C  REFERRING DIAG: [phone] (ICD-10-CM) - Status post total knee replacement, left  THERAPY DIAG:  Muscle weakness (generalized)  Other abnormalities of gait and mobility  Acute pain of left knee  Localized edema  Rationale for Evaluation and Treatment: Rehabilitation  ONSET DATE: 02/02/23 DOS   SUBJECTIVE:  SUBJECTIVE STATEMENT:  Patient reports continued soreness.   PERTINENT HISTORY: See PMH above  PAIN:  Are you having pain? Yes:  NPRS scale: 7/10 Pain location: Left knee Pain description: Sore, stiff Aggravating factors: ROM, activity Relieving factors: Rest, ice, heat  PRECAUTIONS: Knee  PATIENT GOALS: To regain the function in my L knee   OBJECTIVE:  PATIENT SURVEYS:   FOTO: 55 (61 predicted)   03/23/2023: 47% functional status  MUSCLE LENGTH: deferred  LOWER EXTREMITY ROM:  A/PROM Right eval Left eval Left 03/05/2023 Left 03/09/23 Left 03/18/23 Left 03/26/2023 Left  03/31/23 Left 04/15/23   Hip flexion          Hip extension          Hip abduction          Hip adduction          Hip internal rotation          Hip external rotation          Knee flexion  75/80d 85 AROM / 90 PROM 87 PROM 94 AAROM strap 97 AAROM 98 AAROM 100 AAROM  Knee extension  -25/-10d -6 AROM -5 AROM -5 AROM Lacking 3    Ankle dorsiflexion          Ankle plantarflexion          Ankle inversion          Ankle eversion           (Blank rows = not tested)  LOWER EXTREMITY MMT:  MMT Right eval Left eval L 04/02/23  Hip flexion     Hip extension     Hip abduction     Hip adduction     Hip internal rotation     Hip external rotation     Knee flexion  3 3+  Knee extension  3- 3  Ankle dorsiflexion     Ankle plantarflexion     Ankle inversion     Ankle eversion      (Blank rows = not tested)  FUNCTIONAL TESTS:  30s chair stand 1 rep  GAIT: Distance walked: 85ft x2 Assistive device utilized: Environmental consultant - 2 wheeled Level of assistance: Modified independence Comments: slow cadence with step through pattern   TODAY'S TREATMENT:  [facility] Adult PT Treatment:                                                DATE: 04/22/23 Therapeutic Exercise: Recumbent bike partial revolutions x 5 min to work on knee flexion (after 2 minutes able to make full revolutions, slight hip compensation) SLR L with QS to fatigue 15 reps SAQs 3# x15 Heel slides with strap over slide board 10x 94d initial, 100 final FAQs with adduction 15x TKEs GTB x15 Heel raises over 4 in step 2x10 Slant board gastroc stretch 2x30" Modalities: Vasopneumatic (Game Ready)   Location:  left knee Time:  10 minutes Pressure:  low Temperature:  34 degrees   [facility] Adult PT Treatment:                                                DATE: 04/15/23 Therapeutic Exercise: Recumbent bike partial revolutions x 5 min to work on knee flexion SLR L with QS to fatigue 20 reps SAQs 3# x15 Heel slides with strap over slide board 15x 92d initial, 100  final FAQs with adduction 15x TKEs GTB x10 Heel raises over 4 in step 2x10 Slant board gastroc stretch 2x30" Modalities: Pt req MHP at [facility] today, positioned in supine with bolster x10 mins post session     [facility] Adult PT Treatment:                                                DATE: 04/07/23 Therapeutic Exercise: Recumbent bike partial revolutions x 5 min to work on knee flexion SLR L with QS 15x SAQs 3# 15x2  Heel slides with strap over slide board 15x 85d initial, 100 final FAQs with adduction 15x TKEs GTB x10 Heel raises over 4 in step 2x10 Modalities: Cold pack 10 min post session (held vaso to quantify post treatment soreness)    PATIENT EDUCATION:  Education details: HEP Person educated: Patient Education method: Explanation Education comprehension: verbalized understanding and needs further education  HOME EXERCISE PROGRAM: Access Code: [email or web address]   ASSESSMENT: CLINICAL IMPRESSION:  Patient presents to PT reporting continued soreness in her Lt knee. She was able to achieve full revolutions on the bike today, with slight compensation in the hip. Session today continued to focus on LE strengthening and knee ROM. Able to achieve 100 AAROM flexion today. Patient was  able to tolerate all prescribed exercises with no adverse effects. Patient continues to benefit from skilled PT services and should be progressed as able to improve functional independence.     OBJECTIVE IMPAIRMENTS: Abnormal gait, decreased activity tolerance, decreased coordination, decreased endurance, decreased knowledge of condition, decreased knowledge of use of DME, decreased mobility, difficulty walking, decreased ROM, decreased strength, improper body mechanics, and pain.   ACTIVITY LIMITATIONS: carrying, lifting, standing, squatting, and stairs  PERSONAL FACTORS: Age, Fitness, Past/current experiences, and 1 comorbidity: DM  are also affecting patient's functional outcome.    GOALS: Goals reviewed  with patient? No  SHORT TERM GOALS: Target date: 03/26/2023   Patient to demonstrate independence in HEP  Baseline: [id number] Goal status: Met  2.  Increase L knee AROM to 90d flexion and -10d extension Baseline:  A/PROM Right eval Left eval  Hip flexion    Hip extension    Hip abduction    Hip adduction    Hip internal rotation    Hip external rotation    Knee flexion  75/80d  Knee extension  -25/-10d   03/26/2023: see above Goal status: MET  3.  Decrease pain to 4/10 Baseline: 8/10 Goal status: Ongoing  LONG TERM GOALS: Target date: 04/23/2023   Able to ascend 16 steps with LRAD and most appropriate pattern Baseline: 4 steps with B rails and step to pattern Goal status: INITIAL  2.  Increases L knee ROM to -5d extension and 120d flexion Baseline:  A/PROM Right eval Left eval  Hip flexion    Hip extension    Hip abduction    Hip adduction    Hip internal rotation    Hip external rotation    Knee flexion  75/80d  Knee extension  -25/-10d   Goal status: INITIAL  3.  Increase L knee strength to 4/5 Baseline:  MMT Right eval Left eval  Hip flexion    Hip extension    Hip abduction    Hip adduction    Hip internal rotation    Hip external rotation    Knee flexion  3  Knee extension  3-   Goal status: INITIAL  4.  559ft ambulation with LRAD Baseline: 66ft with RW Goal status: INITIAL  5.  Increase FOTO score to 61 Baseline: 55 03/23/2023: 47% functional status Goal status: ONGOING  6.  Increase reps to 6 on 30s chair stand test Baseline: 1 rep Goal status: INITIAL   PLAN: PT FREQUENCY: 1-2x/week  PT DURATION: 8 weeks  PLANNED INTERVENTIONS: Therapeutic exercises, Therapeutic activity, Neuromuscular re-education, Balance training, Gait training, Patient/Family education, Self Care, Joint mobilization, Stair training, DME instructions, Dry Needling, Electrical stimulation, Cryotherapy, Moist heat, scar mobilization, Manual therapy, and  Re-evaluation  PLAN FOR NEXT SESSION: HEP review and update, manual techniques as appropriate, aerobic tasks, ROM and flexibility activities, strengthening and PREs, TPDN, gait and balance training as needed    Berta Minor PTA 04/22/23  5:41 PM Phone: [phone] Fax: [phone]

## 2023-04-25 NOTE — Therapy (Unsigned)
OUTPATIENT PHYSICAL THERAPY TREATMENT   Patient Name: Christine Barrett MRN: [id number] DOB:1961-04-30, 62 y.o., female Today's Date: 04/26/2023   END OF SESSION:  PT End of Session - 04/26/23 0920     Visit Number 14    Number of Visits 16    Date for PT Re-Evaluation 04/28/23    Authorization Type [employer]    PT Start Time 0915    PT Stop Time 1000    PT Time Calculation (min) 45 min    Activity Tolerance Patient tolerated treatment well    Behavior During Therapy [facility] for tasks assessed/performed               Past Medical History:  Diagnosis Date   Allergy    SEASONAL   Anemia    Arthritis    CAD S/P percutaneous coronary angioplasty 09/2013   a) Ostial AV G Cx - 2.5 mm Angiosculpt; mid LAD 40-60%; b) Myoview 07/2014: LOW RISK, small-severe fixed inferior defect c/w infarct w/o peri-infarct ischemia.   Chronic bronchitis (HCC)    "frequently; not q yr" (10/03/2013)   Diabetes mellitus without complication (HCC)    Type 2-diet controlled.    Diverticulosis    Essential hypertension    with prior Accelerated HTN   GERD (gastroesophageal reflux disease)    Hiatal hernia    Hx of non-ST elevation myocardial infarction (NSTEMI) 10/01/2013   Due to Accelerated HTN with existing CAD   Hyperlipemia    Migraine    "@ least once/month" (10/03/2013)   Myocardial infarction (HCC)    OSA (obstructive sleep apnea) 06/10/2016   Schatzki's ring    Seasonal allergies    Seizure (HCC)    1 in elementary school, never knew what caused it   Sinusitis    Spinal headache    during C-section of second child.    Past Surgical History:  Procedure Laterality Date   ABDOMINAL HYSTERECTOMY  1994   "partial"   CAPSULAR RELEASE Right 06/06/2020   Procedure: RIGHT SHOULDER MANIPULATION UNDER ANESTHESIA, ROTATOR CUFF REPAIR;  Surgeon: Cammy Copa, MD;  Location: [facility] OR;  Service: Orthopedics;  Laterality: Right;   CARDIAC CATHETERIZATION N/A 08/05/2015   Procedure: Left Heart Cath  and Coronary Angiography;  Surgeon: Marykay Lex, MD;  Location: [facility] INVASIVE CV LAB;  Service: Cardiovascular;  Laterality: N/A;   CESAREAN SECTION  1989   COLONOSCOPY     CORONARY ANGIOPLASTY  10/03/2013   95% ostial AV G Cx - 2.5 mm AngioSculpt Balloon PTCA; mid LAD 40-60%   LEFT HEART CATHETERIZATION WITH CORONARY ANGIOGRAM N/A 10/02/2013   Procedure: LEFT HEART CATHETERIZATION WITH CORONARY ANGIOGRAM;  Surgeon: Lennette Bihari, MD;  Location: [facility] CATH LAB;  Service: Cardiovascular;  Laterality: N/A;   NASAL SEPTOPLASTY W/ TURBINOPLASTY  ~ 2007   NM MYOVIEW LTD  08/22/2014    Low risk stress nuclear study with a small, severe, fixed defect in the distal inferior wall/apex suggestive of small prior infarct; no ischemia.  LV Wall Motion:  NL LV Function; NL Wall Motion   PERCUTANEOUS CORONARY STENT INTERVENTION (PCI-S) N/A 10/03/2013   cutting balloon angioplasty only no stent.    SINOSCOPY     TOTAL KNEE ARTHROPLASTY Left 02/02/2023   Procedure: LEFT TOTAL KNEE ARTHROPLASTY;  Surgeon: Cammy Copa, MD;  Location: [facility] OR;  Service: Orthopedics;  Laterality: Left;   TRANSTHORACIC ECHOCARDIOGRAM  10/02/2013   EF 55-60%; mild LVH, no RWMA,    TUBAL LIGATION  1989  Patient Active Problem List   Diagnosis Date Noted   Arthritis of left knee 02/14/2023   OA (osteoarthritis) of knee 02/02/2023   Status post total knee replacement, left 02/02/2023   Coronary artery disease involving native heart with angina pectoris, unspecified vessel or lesion type (HCC) 10/29/2021   Perineal irritation in female 08/15/2021   Dysuria 08/15/2021   Plantar fasciitis of right foot 07/04/2021   Trochanteric bursitis of left hip 04/08/2021   Pain of left middle finger 04/08/2021   Other allergic rhinitis 01/01/2021   Frequent episodes of sinusitis 01/01/2021   History of bronchitis 01/01/2021   Allergic conjunctivitis of both eyes 01/01/2021   Family history of breast cancer in sister 09/20/2020   Type  2 diabetes mellitus with other circulatory complications, HTN, CVD (HCC) 05/17/2020   Morbid obesity (HCC) 05/17/2020   BMI 40.0-44.9, adult (HCC) 05/17/2020   Acute non-recurrent maxillary sinusitis 11/04/2016   Acute pain of both knees 08/28/2016   OSA (obstructive sleep apnea) 06/10/2016   Costochondritis 06/09/2016   Bilateral leg cramps 02/07/2016   Peripheral edema 01/29/2016   Hypertension associated with diabetes (HCC)    GERD (gastroesophageal reflux disease) 08/10/2014   Vaginal dryness, menopausal 08/10/2014   CAD S/P percutaneous coronary angioplasty - Ostial AVG Cx - Scoring balloon PTCA 10/03/2013   Hyperlipidemia with target LDL less than 70 10/02/2013   H/O non-ST elevation myocardial infarction (NSTEMI) 10/02/2013   Normocytic anemia, not due to blood loss    Upper respiratory tract infection 03/26/2009   Allergic sinusitis 03/17/2007   Migraine without aura 03/16/2007    PCP: Excell Seltzer, MD   REFERRING PROVIDER: Julieanne Cotton, PA-C  REFERRING DIAG: [phone] (ICD-10-CM) - Status post total knee replacement, left  THERAPY DIAG:  Muscle weakness (generalized)  Other abnormalities of gait and mobility  Acute pain of left knee  Rationale for Evaluation and Treatment: Rehabilitation  ONSET DATE: 02/02/23 DOS   SUBJECTIVE:  SUBJECTIVE STATEMENT:  Patient reports continued soreness.   PERTINENT HISTORY: See PMH above  PAIN:  Are you having pain? Yes:  NPRS scale: 7/10 Pain location: Left knee Pain description: Sore, stiff Aggravating factors: ROM, activity Relieving factors: Rest, ice, heat  PRECAUTIONS: Knee  PATIENT GOALS: To regain the function in my L knee   OBJECTIVE:  PATIENT SURVEYS:   FOTO: 55 (61 predicted)   03/23/2023: 47% functional status  MUSCLE LENGTH: deferred  LOWER EXTREMITY ROM:  A/PROM Right eval Left eval Left 03/05/2023 Left 03/09/23 Left 03/18/23 Left 03/26/2023 Left  03/31/23 Left 04/15/23  Hip flexion           Hip extension          Hip abduction          Hip adduction          Hip internal rotation          Hip external rotation          Knee flexion  75/80d 85 AROM / 90 PROM 87 PROM 94 AAROM strap 97 AAROM 98 AAROM 100 AAROM  Knee extension  -25/-10d -6 AROM -5 AROM -5 AROM Lacking 3    Ankle dorsiflexion          Ankle plantarflexion          Ankle inversion          Ankle eversion           (Blank rows = not tested)  LOWER EXTREMITY MMT:  MMT Right  eval Left eval L 04/02/23  Hip flexion     Hip extension     Hip abduction     Hip adduction     Hip internal rotation     Hip external rotation     Knee flexion  3 3+  Knee extension  3- 3  Ankle dorsiflexion     Ankle plantarflexion     Ankle inversion     Ankle eversion      (Blank rows = not tested)  FUNCTIONAL TESTS:  30s chair stand 1 rep  GAIT: Distance walked: 7ft x2 Assistive device utilized: Environmental consultant - 2 wheeled Level of assistance: Modified independence Comments: slow cadence with step through pattern   TODAY'S TREATMENT:  [facility] Adult PT Treatment:                                                DATE: 04/26/23 Therapeutic Exercise: Nustep L4 6 min(patient monitored as she tends to pause during tasks which also pauses time) SLR with QS 15x Heel slides 15x 115d  SAQs 3# 15x2 2s hold TKE BluTB 15x 10 min ice pack (knee extended)   [facility] Adult PT Treatment:                                                DATE: 04/22/23 Therapeutic Exercise: Recumbent bike partial revolutions x 5 min to work on knee flexion (after 2 minutes able to make full revolutions, slight hip compensation) SLR L with QS to fatigue 15 reps SAQs 3# x15 Heel slides with strap over slide board 10x 94d initial, 100 final FAQs with adduction 15x TKEs GTB x15 Heel raises over 4 in step 2x10 Slant board gastroc stretch 2x30" Modalities: Vasopneumatic (Game Ready)   Location:  left knee Time:  10 minutes Pressure:  low Temperature:  34  degrees   [facility] Adult PT Treatment:                                                DATE: 04/15/23 Therapeutic Exercise: Recumbent bike partial revolutions x 5 min to work on knee flexion SLR L with QS to fatigue 20 reps SAQs 3# x15 Heel slides with strap over slide board 15x 92d initial, 100 final FAQs with adduction 15x TKEs GTB x10 Heel raises over 4 in step 2x10 Slant board gastroc stretch 2x30" Modalities: Pt req MHP at EOS today, positioned in supine with bolster x10 mins post session     [facility] Adult PT Treatment:                                                DATE: 04/07/23 Therapeutic Exercise: Recumbent bike partial revolutions x 5 min to work on knee flexion SLR L with QS 15x SAQs 3# 15x2  Heel slides with strap over slide board 15x 85d initial, 100 final FAQs with adduction 15x TKEs GTB x10 Heel raises over 4 in step 2x10 Modalities: Cold pack 10  min post session (held vaso to quantify post treatment soreness)    PATIENT EDUCATION:  Education details: HEP Person educated: Patient Education method: Explanation Education comprehension: verbalized understanding and needs further education  HOME EXERCISE PROGRAM: Access Code: [email or web address]   ASSESSMENT: CLINICAL IMPRESSION: Patient now close to 3 months post-op with c/o stiffness and swelling vs sharp pain.  Today's session continued to emphasize ROM and regaining quad strength.  Advanced resistance as noted, discontinued vaso in favor of CP as needed.  Discussed need to focus on knee strength to help stabilize joint structures as patient lacks TKE with gait and observed with functional tasks.    OBJECTIVE IMPAIRMENTS: Abnormal gait, decreased activity tolerance, decreased coordination, decreased endurance, decreased knowledge of condition, decreased knowledge of use of DME, decreased mobility, difficulty walking, decreased ROM, decreased strength, improper body mechanics, and pain.   ACTIVITY LIMITATIONS: carrying,  lifting, standing, squatting, and stairs  PERSONAL FACTORS: Age, Fitness, Past/current experiences, and 1 comorbidity: DM  are also affecting patient's functional outcome.    GOALS: Goals reviewed with patient? No  SHORT TERM GOALS: Target date: 03/26/2023   Patient to demonstrate independence in HEP  Baseline: [email or web address] Goal status: Met  2.  Increase L knee AROM to 90d flexion and -10d extension Baseline:  A/PROM Right eval Left eval  Hip flexion    Hip extension    Hip abduction    Hip adduction    Hip internal rotation    Hip external rotation    Knee flexion  75/80d  Knee extension  -25/-10d   03/26/2023: see above Goal status: MET  3.  Decrease pain to 4/10 Baseline: 8/10; 4-5/10 pain vs soreness Goal status: Met  LONG TERM GOALS: Target date: 04/23/2023   Able to ascend 16 steps with LRAD and most appropriate pattern Baseline: 4 steps with B rails and step to pattern Goal status: INITIAL  2.  Increases L knee ROM to -5d extension and 120d flexion Baseline:  A/PROM Right eval Left eval  Hip flexion    Hip extension    Hip abduction    Hip adduction    Hip internal rotation    Hip external rotation    Knee flexion  75/80d  Knee extension  -25/-10d   Goal status: INITIAL  3.  Increase L knee strength to 4/5 Baseline:  MMT Right eval Left eval  Hip flexion    Hip extension    Hip abduction    Hip adduction    Hip internal rotation    Hip external rotation    Knee flexion  3  Knee extension  3-   Goal status: INITIAL  4.  537ft ambulation with LRAD Baseline: 28ft with RW Goal status: INITIAL  5.  Increase FOTO score to 61 Baseline: 55 03/23/2023: 47% functional status Goal status: ONGOING  6.  Increase reps to 6 on 30s chair stand test Baseline: 1 rep Goal status: INITIAL   PLAN: PT FREQUENCY: 1-2x/week  PT DURATION: 8 weeks  PLANNED INTERVENTIONS: Therapeutic exercises, Therapeutic activity, Neuromuscular re-education, Balance  training, Gait training, Patient/Family education, Self Care, Joint mobilization, Stair training, DME instructions, Dry Needling, Electrical stimulation, Cryotherapy, Moist heat, scar mobilization, Manual therapy, and Re-evaluation  PLAN FOR NEXT SESSION: HEP review and update, manual techniques as appropriate, aerobic tasks, ROM and flexibility activities, strengthening and PREs, TPDN, gait and balance training as needed    Hildred Laser PT 04/26/23  10:55 AM Phone: [phone] Fax: [phone]

## 2023-04-26 ENCOUNTER — Ambulatory Visit: Payer: Medicaid Other

## 2023-04-26 DIAGNOSIS — M6281 Muscle weakness (generalized): Secondary | ICD-10-CM

## 2023-04-26 DIAGNOSIS — M25562 Pain in left knee: Secondary | ICD-10-CM

## 2023-04-26 DIAGNOSIS — R2689 Other abnormalities of gait and mobility: Secondary | ICD-10-CM

## 2023-04-28 ENCOUNTER — Ambulatory Visit (INDEPENDENT_AMBULATORY_CARE_PROVIDER_SITE_OTHER): Payer: Medicaid Other | Admitting: Surgical

## 2023-04-28 ENCOUNTER — Encounter: Payer: Self-pay | Admitting: Surgical

## 2023-04-28 DIAGNOSIS — Z96652 Presence of left artificial knee joint: Secondary | ICD-10-CM

## 2023-04-28 NOTE — Progress Notes (Signed)
Post-Op Visit Note   Patient: Christine Barrett           Date of Birth: 09/16/1960           MRN: [id number] Visit Date: 04/28/2023 PCP: Excell Seltzer, MD   Assessment & Plan:  Chief Complaint:  Chief Complaint  Patient presents with   Left Knee - Follow-up    Left total knee arthroplasty 02/02/2023   Visit Diagnoses:  1. Status post total knee replacement, left     Plan: Patient is a 62 year old female who presents s/p left total knee arthroplasty on 02/02/2023.  She is overall doing well.  Has reached 114 degrees of flexion in physical therapy.  Does have some continued soreness and stiffness at times but overall she feels that she is in a very good place.  Not taking any medications for pain.  She went back to work on 10/15 which involves a lot of driving.  Go to physical therapy 2 times per week and is planning on continuing this for another month.  On exam, patient has excellent quad strength rated 5/5.  No knee effusion noted.  Incision is well-healed.  No calf tenderness.  Negative Homans' sign.  She has range of motion from about 3 degrees extension to 115 degrees of knee flexion.  Plan is continue with therapy exercises and follow-up with the office as needed.  Did discuss dental antibiotic prophylaxis.  She has done very well and the knee is functioning very well for her not even 3 months out from surgery.  Follow-Up Instructions: No follow-ups on file.   Orders:  No orders of the defined types were placed in this encounter.  No orders of the defined types were placed in this encounter.   Imaging: No results found.  PMFS History: Patient Active Problem List   Diagnosis Date Noted   Arthritis of left knee 02/14/2023   OA (osteoarthritis) of knee 02/02/2023   Status post total knee replacement, left 02/02/2023   Coronary artery disease involving native heart with angina pectoris, unspecified vessel or lesion type (HCC) 10/29/2021   Perineal irritation in female 08/15/2021    Dysuria 08/15/2021   Plantar fasciitis of right foot 07/04/2021   Trochanteric bursitis of left hip 04/08/2021   Pain of left middle finger 04/08/2021   Other allergic rhinitis 01/01/2021   Frequent episodes of sinusitis 01/01/2021   History of bronchitis 01/01/2021   Allergic conjunctivitis of both eyes 01/01/2021   Family history of breast cancer in sister 09/20/2020   Type 2 diabetes mellitus with other circulatory complications, HTN, CVD (HCC) 05/17/2020   Morbid obesity (HCC) 05/17/2020   BMI 40.0-44.9, adult (HCC) 05/17/2020   Acute non-recurrent maxillary sinusitis 11/04/2016   Acute pain of both knees 08/28/2016   OSA (obstructive sleep apnea) 06/10/2016   Costochondritis 06/09/2016   Bilateral leg cramps 02/07/2016   Peripheral edema 01/29/2016   Hypertension associated with diabetes (HCC)    GERD (gastroesophageal reflux disease) 08/10/2014   Vaginal dryness, menopausal 08/10/2014   CAD S/P percutaneous coronary angioplasty - Ostial AVG Cx - Scoring balloon PTCA 10/03/2013   Hyperlipidemia with target LDL less than 70 10/02/2013   H/O non-ST elevation myocardial infarction (NSTEMI) 10/02/2013   Normocytic anemia, not due to blood loss    Upper respiratory tract infection 03/26/2009   Allergic sinusitis 03/17/2007   Migraine without aura 03/16/2007   Past Medical History:  Diagnosis Date   Allergy    SEASONAL   Anemia  Arthritis    CAD S/P percutaneous coronary angioplasty 09/2013   a) Ostial AV G Cx - 2.5 mm Angiosculpt; mid LAD 40-60%; b) Myoview 07/2014: LOW RISK, small-severe fixed inferior defect c/w infarct w/o peri-infarct ischemia.   Chronic bronchitis (HCC)    "frequently; not q yr" (10/03/2013)   Diabetes mellitus without complication (HCC)    Type 2-diet controlled.    Diverticulosis    Essential hypertension    with prior Accelerated HTN   GERD (gastroesophageal reflux disease)    Hiatal hernia    Hx of non-ST elevation myocardial infarction  (NSTEMI) 10/01/2013   Due to Accelerated HTN with existing CAD   Hyperlipemia    Migraine    "@ least once/month" (10/03/2013)   Myocardial infarction (HCC)    OSA (obstructive sleep apnea) 06/10/2016   Schatzki's ring    Seasonal allergies    Seizure (HCC)    1 in elementary school, never knew what caused it   Sinusitis    Spinal headache    during C-section of second child.     Family History  Adopted: Yes  Problem Relation Age of Onset   Allergic rhinitis Mother    Diabetes Mother    Sinusitis Mother    Breast cancer Sister    Hypertension Sister    Colon cancer Neg Hx    Heart attack Neg Hx    Stroke Neg Hx     Past Surgical History:  Procedure Laterality Date   ABDOMINAL HYSTERECTOMY  1994   "partial"   CAPSULAR RELEASE Right 06/06/2020   Procedure: RIGHT SHOULDER MANIPULATION UNDER ANESTHESIA, ROTATOR CUFF REPAIR;  Surgeon: Cammy Copa, MD;  Location: [facility] OR;  Service: Orthopedics;  Laterality: Right;   CARDIAC CATHETERIZATION N/A 08/05/2015   Procedure: Left Heart Cath and Coronary Angiography;  Surgeon: Marykay Lex, MD;  Location: [facility] INVASIVE CV LAB;  Service: Cardiovascular;  Laterality: N/A;   CESAREAN SECTION  1989   COLONOSCOPY     CORONARY ANGIOPLASTY  10/03/2013   95% ostial AV G Cx - 2.5 mm AngioSculpt Balloon PTCA; mid LAD 40-60%   LEFT HEART CATHETERIZATION WITH CORONARY ANGIOGRAM N/A 10/02/2013   Procedure: LEFT HEART CATHETERIZATION WITH CORONARY ANGIOGRAM;  Surgeon: Lennette Bihari, MD;  Location: [facility] CATH LAB;  Service: Cardiovascular;  Laterality: N/A;   NASAL SEPTOPLASTY W/ TURBINOPLASTY  ~ 2007   NM MYOVIEW LTD  08/22/2014    Low risk stress nuclear study with a small, severe, fixed defect in the distal inferior wall/apex suggestive of small prior infarct; no ischemia.  LV Wall Motion:  NL LV Function; NL Wall Motion   PERCUTANEOUS CORONARY STENT INTERVENTION (PCI-S) N/A 10/03/2013   cutting balloon angioplasty only no stent.    SINOSCOPY      TOTAL KNEE ARTHROPLASTY Left 02/02/2023   Procedure: LEFT TOTAL KNEE ARTHROPLASTY;  Surgeon: Cammy Copa, MD;  Location: [facility] OR;  Service: Orthopedics;  Laterality: Left;   TRANSTHORACIC ECHOCARDIOGRAM  10/02/2013   EF 55-60%; mild LVH, no RWMA,    TUBAL LIGATION  1989   Social History   Occupational History   Occupation: Biomedical engineer  Tobacco Use   Smoking status: Never    Passive exposure: Never   Smokeless tobacco: Never  Vaping Use   Vaping status: Never Used  Substance and Sexual Activity   Alcohol use: Not Currently    Comment: occ   Drug use: No   Sexual activity: Yes    Birth control/protection: Surgical

## 2023-05-02 ENCOUNTER — Other Ambulatory Visit: Payer: Self-pay | Admitting: Surgical

## 2023-05-04 ENCOUNTER — Telehealth: Payer: Self-pay | Admitting: *Deleted

## 2023-05-04 DIAGNOSIS — E785 Hyperlipidemia, unspecified: Secondary | ICD-10-CM

## 2023-05-04 DIAGNOSIS — E1159 Type 2 diabetes mellitus with other circulatory complications: Secondary | ICD-10-CM

## 2023-05-04 NOTE — Telephone Encounter (Signed)
-----   Message from Alvina Chou sent at 05/04/2023  9:13 AM EST ----- Regarding: Lab orders for Tue, 11.19.24 Lab orders, thanks

## 2023-05-07 ENCOUNTER — Encounter: Payer: Self-pay | Admitting: Family Medicine

## 2023-05-07 DIAGNOSIS — R1310 Dysphagia, unspecified: Secondary | ICD-10-CM

## 2023-05-07 MED ORDER — PANTOPRAZOLE SODIUM 40 MG PO TBEC
40.0000 mg | DELAYED_RELEASE_TABLET | Freq: Every day | ORAL | 0 refills | Status: DC
Start: 2023-05-07 — End: 2023-08-04

## 2023-05-10 ENCOUNTER — Ambulatory Visit: Payer: Self-pay

## 2023-05-10 NOTE — Patient Outreach (Signed)
  Care Coordination   Follow Up Visit Note   05/10/2023 Name: Christine Barrett MRN: [id number] DOB: 03/07/1961  Christine Barrett is a 61 y.o. year old female who sees Excell Seltzer, MD for primary care. I spoke with  Coy Saunas by phone today.  What matters to the patients health and wellness today?  Patient states she is walking slow but continues to progress. Patient states she was told by her physical therapist that she would have and additional 4 weeks but has not been scheduled.  Patient reports having follow up visit with orthopedic on 04/28/23.  Patient denies having any further needs or concerns and is agreeable that care coordination services have been met. Patient states she is back to work.    Goals Addressed             This Visit's Progress    Ongoing improvement post TKR       Interventions Today    Flowsheet Row Most Recent Value  Chronic Disease   Chronic disease during today's visit Other  [left TKR]  General Interventions   General Interventions Discussed/Reviewed General Interventions Reviewed, Doctor Visits  [evaluation of current treatment plan for status post Left TKR and patients adherence to plan as established by provider.  Assessed for new/ ongoing post TKR symptoms.]  Doctor Visits Discussed/Reviewed Doctor Visits Reviewed  Exercise Interventions   Exercise Discussed/Reviewed Physical Activity  [Encouraged patient to call outpatient rehab center regarding her additional PT visits.  Encouraged patient to continue instructed rehab exercises daily]  Education Interventions   Education Provided Provided Education  [Advised to notify orthopedic provider for any new concerns. Advised patient to call RNCM if care coordination services needed in the future.]  Pharmacy Interventions   Pharmacy Dicussed/Reviewed Pharmacy Topics Reviewed              SDOH assessments and interventions completed:  No     Care Coordination Interventions:  Yes, provided   Follow up  plan: No further intervention required.   Encounter Outcome:  Patient Visit Completed   George Ina RN,BSN,CCM [facility]  [facility], [facility] coordinator / Case Manager Phone: [phone]

## 2023-05-10 NOTE — Patient Instructions (Signed)
Visit Information  Thank you for taking time to visit with me today.Your care coordination goals have been met.   What we discussed today:   Goals Addressed             This Visit's Progress    Ongoing improvement post TKR       Interventions Today    Flowsheet Row Most Recent Value  Chronic Disease   Chronic disease during today's visit Other  [left TKR]  General Interventions   General Interventions Discussed/Reviewed General Interventions Reviewed, Doctor Visits  [evaluation of current treatment plan for status post Left TKR and patients adherence to plan as established by provider.  Assessed for new/ ongoing post TKR symptoms.]  Doctor Visits Discussed/Reviewed Doctor Visits Reviewed  Exercise Interventions   Exercise Discussed/Reviewed Physical Activity  [Encouraged patient to call outpatient rehab center regarding her additional PT visits.  Encouraged patient to continue instructed rehab exercises daily]  Education Interventions   Education Provided Provided Education  [Advised to notify orthopedic provider for any new concerns. Advised patient to call RNCM if care coordination services needed in the future.]  Pharmacy Interventions   Pharmacy Dicussed/Reviewed Pharmacy Topics Reviewed              Please contact your primary care provider if care coordination services needed in the future.   If you are experiencing a Mental Health or Behavioral Health Crisis or need someone to talk to, please call the Suicide and Crisis Lifeline: 988 call 1-800-273-TALK (toll free, 24 hour hotline)  Patient verbalizes understanding of instructions and care plan provided today and agrees to view in MyChart. Active MyChart status and patient understanding of how to access instructions and care plan via MyChart confirmed with patient.     George Ina RN,BSN,CCM [place]  [facility], [facility] coordinator / Case Manager Phone: [phone]

## 2023-05-17 ENCOUNTER — Other Ambulatory Visit (INDEPENDENT_AMBULATORY_CARE_PROVIDER_SITE_OTHER): Payer: Medicaid Other

## 2023-05-17 DIAGNOSIS — E1159 Type 2 diabetes mellitus with other circulatory complications: Secondary | ICD-10-CM | POA: Diagnosis not present

## 2023-05-17 DIAGNOSIS — E785 Hyperlipidemia, unspecified: Secondary | ICD-10-CM

## 2023-05-17 LAB — COMPREHENSIVE METABOLIC PANEL
ALT: 13 U/L (ref 0–35)
AST: 13 U/L (ref 0–37)
Albumin: 4.2 g/dL (ref 3.5–5.2)
Alkaline Phosphatase: 95 U/L (ref 39–117)
BUN: 11 mg/dL (ref 6–23)
CO2: 28 meq/L (ref 19–32)
Calcium: 9.6 mg/dL (ref 8.4–10.5)
Chloride: 103 meq/L (ref 96–112)
Creatinine, Ser: 0.73 mg/dL (ref 0.40–1.20)
GFR: 88.67 mL/min (ref >60.00)
Glucose, Bld: 123 mg/dL — ABNORMAL HIGH (ref 70–99)
Potassium: 3.9 meq/L (ref 3.5–5.1)
Sodium: 140 meq/L (ref 135–145)
Total Bilirubin: 0.8 mg/dL (ref 0.2–1.2)
Total Protein: 7.2 g/dL (ref 6.0–8.3)

## 2023-05-17 LAB — HEMOGLOBIN A1C: Hgb A1c MFr Bld: 6.5 % (ref 4.6–6.5)

## 2023-05-17 LAB — LIPID PANEL
Cholesterol: 235 mg/dL — ABNORMAL HIGH (ref 0–200)
HDL: 45.2 mg/dL (ref >39.00)
LDL Cholesterol: 162 mg/dL — ABNORMAL HIGH (ref 0–99)
NonHDL: 189.88
Total CHOL/HDL Ratio: 5
Triglycerides: 139 mg/dL (ref 0.0–149.0)
VLDL: 27.8 mg/dL (ref 0.0–40.0)

## 2023-05-18 ENCOUNTER — Other Ambulatory Visit: Payer: Medicaid Other

## 2023-05-18 NOTE — Progress Notes (Signed)
No critical labs need to be addressed urgently. We will discuss labs in detail at upcoming office visit.   

## 2023-05-21 ENCOUNTER — Encounter: Payer: Self-pay | Admitting: Family Medicine

## 2023-05-21 ENCOUNTER — Ambulatory Visit (INDEPENDENT_AMBULATORY_CARE_PROVIDER_SITE_OTHER): Payer: Medicaid Other | Admitting: Family Medicine

## 2023-05-21 VITALS — BP 90/60 | HR 75 | Temp 98.7°F | Ht 68.5 in | Wt 265.0 lb

## 2023-05-21 DIAGNOSIS — E785 Hyperlipidemia, unspecified: Secondary | ICD-10-CM | POA: Diagnosis not present

## 2023-05-21 DIAGNOSIS — I152 Hypertension secondary to endocrine disorders: Secondary | ICD-10-CM | POA: Diagnosis not present

## 2023-05-21 DIAGNOSIS — E1159 Type 2 diabetes mellitus with other circulatory complications: Secondary | ICD-10-CM

## 2023-05-21 DIAGNOSIS — Z7985 Long-term (current) use of injectable non-insulin antidiabetic drugs: Secondary | ICD-10-CM | POA: Diagnosis not present

## 2023-05-21 DIAGNOSIS — R0981 Nasal congestion: Secondary | ICD-10-CM | POA: Diagnosis not present

## 2023-05-21 DIAGNOSIS — J301 Allergic rhinitis due to pollen: Secondary | ICD-10-CM | POA: Diagnosis not present

## 2023-05-21 MED ORDER — TIRZEPATIDE 5 MG/0.5ML ~~LOC~~ SOAJ: 5.0000 mg | SUBCUTANEOUS | 11 refills | Status: DC

## 2023-05-21 MED ORDER — MONTELUKAST SODIUM 10 MG PO TABS
10.0000 mg | ORAL_TABLET | Freq: Every day | ORAL | 3 refills | Status: DC
Start: 2023-05-21 — End: 2024-05-05

## 2023-05-21 MED ORDER — LOSARTAN POTASSIUM 100 MG PO TABS: 100.0000 mg | ORAL_TABLET | Freq: Every day | ORAL | 3 refills | Status: DC

## 2023-05-21 MED ORDER — FAMOTIDINE 20 MG PO TABS: 20.0000 mg | ORAL_TABLET | Freq: Every day | ORAL | 3 refills | Status: DC

## 2023-05-21 NOTE — Patient Instructions (Addendum)
Increase to  Mounjaro 5 mg weekly.  Stop amlodipine... get BP cuff follow BP at home.. Call if > 2140/90 off amlodipine.  Continue other BP meds.

## 2023-05-21 NOTE — Addendum Note (Signed)
Addended by: Kerby Nora E on: 05/21/2023 10:32 AM   Modules accepted: Level of Service

## 2023-05-21 NOTE — Assessment & Plan Note (Signed)
Chronic,  LDL   far from  goal LD < 70 with CAD history Despite low cholesterol diet, zetia and crestor 20 mg daily.   She will discuss possible addition of PCSK2 inhibitor  (Repatha or Praluent) with cardiologist.

## 2023-05-21 NOTE — Assessment & Plan Note (Signed)
Chronic, lower BPs with weight loss of 35 lbs.  Will D/C amlodipine.  Follow BP at home.   Continue  Coreg 25 mg p.o. twice daily, hydrochlorothiazide 25 mg daily and losartan 100 mg p.o. daily

## 2023-05-21 NOTE — Assessment & Plan Note (Signed)
Chronic, good control on Mounjaro 2.5 mg weekly, we will proceed with increasing this to 5 mg weekly for more significant weight loss and continued diabetes control.

## 2023-05-21 NOTE — Assessment & Plan Note (Signed)
Mounjarno.. increase to 5 mg weekly  DM and CAD history  Encouraged exercise, weight loss, healthy eating habits.

## 2023-05-21 NOTE — Progress Notes (Signed)
Patient ID: Christine Barrett, female    DOB: [birth year], 62 y.o.   MRN: [id number]  This visit was conducted in person.  BP 90/60 (BP Location: Left Arm, Patient Position: Sitting, Cuff Size: Large)   Pulse 75   Temp 98.7 F (37.1 C) (Temporal)   Ht 5' 8.5" (1.74 m)   Wt 265 lb (120.2 kg)   SpO2 98%   BMI 39.71 kg/m    CC:  Chief Complaint  Patient presents with   Diabetes    Subjective:   HPI: Christine Barrett is a 62 y.o. female presenting on 05/21/2023 for Diabetes  Reviewed recent operative notes from February 02, 2023 for left total knee arthroplasty, Dr. August Saucer    She has a history of type 2 diabetes Lab Results  Component Value Date   HGBA1C 6.5 05/17/2023  On Mounjaro 2.5 mg weekly... started back this week.   BP well controlled.  No CP, no SOB.   She has lost 35 lbs. Wt Readings from Last 3 Encounters:  05/21/23 265 lb (120.2 kg)  04/09/23 267 lb (121.1 kg)  02/10/23 272 lb (123.4 kg)   Elevated Cholesterol:  LDL not at goal on  zetia and crestor 20 mg daily. Lab Results  Component Value Date   CHOL 235 (H) 05/17/2023   HDL 45.20 05/17/2023   LDLCALC 162 (H) 05/17/2023   LDLDIRECT 158.0 03/16/2022   TRIG 139.0 05/17/2023   CHOLHDL 5 05/17/2023  Using medications without problems: Muscle aches:  Diet compliance: Exercise: Other complaints:  Hypertension:  Low normal on amlodipine 5 mg p.o. daily, Coreg 25 mg twice daily, hydrochlorothiazide 25 mg p.o. daily, isosorbide 60 mg 2 tablets p.o. daily, Cozaar 100 mg p.o. daily BP Readings from Last 3 Encounters:  05/21/23 90/60  04/09/23 118/70  02/10/23 126/80  Using medication without problems or lightheadedness:  none Chest pain with exertion: Edema: none none Short of breath:none Average home BPs: Other issues:    Relevant past medical, surgical, family and social history reviewed and updated as indicated. Interim medical history since our last visit reviewed. Allergies and medications reviewed and  updated. Outpatient Medications Prior to Visit  Medication Sig Dispense Refill   acetaminophen (TYLENOL) 325 MG tablet Take 1-2 tablets (325-650 mg total) by mouth every 6 (six) hours as needed for mild pain (pain score 1-3 or temp > 100.5). 60 tablet 0   albuterol (VENTOLIN HFA) 108 (90 Base) MCG/ACT inhaler INHALE 2 PUFFS BY MOUTH EVERY 6 HOURS AS NEEDED FOR WHEEZING OR  SHORTNESS  OF  BREATH 18 g 0   amLODipine (NORVASC) 5 MG tablet Take 1 tablet (5 mg total) by mouth daily. 180 tablet 1   ASPIRIN LOW DOSE 81 MG chewable tablet CHEW 1 TABLET (81 MG TOTAL) BY MOUTH 2 (TWO) TIMES DAILY. TO PREVENT BLOOD CLOTS 180 tablet 1   BLACK ELDERBERRY PO Take 2 each by mouth daily.     Blood Glucose Monitoring Suppl (ONETOUCH VERIO) w/Device KIT Use to check blood sugar 2 times a day 1 kit 0   carvedilol (COREG) 25 MG tablet TAKE 1 TABLET BY MOUTH TWICE DAILY WITH A MEAL 180 tablet 1   Cholecalciferol (VITAMIN D3 PO) Take 1 tablet by mouth daily.     ezetimibe (ZETIA) 10 MG tablet Take 1 tablet (10 mg total) by mouth daily. 90 tablet 3   fluticasone (FLONASE) 50 MCG/ACT nasal spray Place 2 sprays into both nostrils daily as needed for allergies or rhinitis.  glucose blood (ONETOUCH VERIO) test strip Use to check blood sugar 2 times a day 200 strip 3   hydrochlorothiazide (HYDRODIURIL) 25 MG tablet TAKE 1 TABLET DAILY 90 tablet 3   isosorbide mononitrate (IMDUR) 60 MG 24 hr tablet Take 2 tablets (120 mg total) by mouth daily. 180 tablet 0   Lancet Devices (ONE TOUCH DELICA LANCING DEV) MISC Use to check blood sugar 2 times a day 1 each 0   Lancets (ONETOUCH DELICA PLUS LANCET30G) MISC USE TO CHECK BLOOD SUGAR 2 TIMES A DAY 200 each 3   latanoprost (XALATAN) 0.005 % ophthalmic solution Place 1 drop into both eyes at bedtime.      levocetirizine (XYZAL) 5 MG tablet Take 5 mg by mouth daily.     Multiple Vitamin (MULTIVITAMIN WITH MINERALS) TABS tablet Take 1 tablet by mouth daily.     Multiple  Vitamins-Minerals (EMERGEN-C IMMUNE) PACK Take 1 packet by mouth daily as needed (during cold season).     pantoprazole (PROTONIX) 40 MG tablet Take 1 tablet (40 mg total) by mouth daily. 90 tablet 0   timolol (TIMOPTIC) 0.5 % ophthalmic solution Place 1 drop into both eyes every morning.     zinc gluconate 50 MG tablet Take 50 mg by mouth daily.     famotidine (PEPCID) 20 MG tablet Take 1 tablet (20 mg total) by mouth daily. 90 tablet 1   losartan (COZAAR) 100 MG tablet Take 1 tablet (100 mg total) by mouth daily. 90 tablet 1   montelukast (SINGULAIR) 10 MG tablet Take 1 tablet (10 mg total) by mouth at bedtime. 90 tablet 3   tirzepatide (MOUNJARO) 2.5 MG/0.5ML Pen INJECT 2.5 MG SUBCUTANEOUSLY WEEKLY 6 mL 0   rosuvastatin (CRESTOR) 20 MG tablet Take 1 tablet (20 mg total) by mouth daily. 90 tablet 3   celecoxib (CELEBREX) 100 MG capsule Take 1 capsule (100 mg total) by mouth 2 (two) times daily. 40 capsule 0   docusate sodium (COLACE) 100 MG capsule Take 1 capsule (100 mg total) by mouth 2 (two) times daily. 10 capsule 0   HYDROmorphone (DILAUDID) 2 MG tablet Take 0.5-1 tablets (1-2 mg total) by mouth every 6 (six) hours as needed for severe pain. 30 tablet 0   methocarbamol (ROBAXIN) 500 MG tablet Take 1 tablet (500 mg total) by mouth every 8 (eight) hours as needed for muscle spasms. 30 tablet 1   ondansetron (ZOFRAN) 4 MG tablet Take 1 tablet (4 mg total) by mouth every 6 (six) hours as needed for nausea. 20 tablet 0   No facility-administered medications prior to visit.     Per HPI unless specifically indicated in ROS section below Review of Systems  Constitutional:  Negative for fatigue and fever.  HENT:  Negative for congestion.   Eyes:  Negative for pain.  Respiratory:  Negative for cough and shortness of breath.   Cardiovascular:  Negative for chest pain, palpitations and leg swelling.  Gastrointestinal:  Negative for abdominal pain.  Genitourinary:  Negative for dysuria and  vaginal bleeding.  Musculoskeletal:  Negative for back pain.  Neurological:  Negative for syncope, light-headedness and headaches.  Psychiatric/Behavioral:  Negative for dysphoric mood.    Objective:  BP 90/60 (BP Location: Left Arm, Patient Position: Sitting, Cuff Size: Large)   Pulse 75   Temp 98.7 F (37.1 C) (Temporal)   Ht 5' 8.5" (1.74 m)   Wt 265 lb (120.2 kg)   SpO2 98%   BMI 39.71 kg/m   Wt Readings  from Last 3 Encounters:  05/21/23 265 lb (120.2 kg)  04/09/23 267 lb (121.1 kg)  02/10/23 272 lb (123.4 kg)      Physical Exam Constitutional:      General: She is not in acute distress.    Appearance: Normal appearance. She is well-developed. She is not ill-appearing or toxic-appearing.  HENT:     Head: Normocephalic.     Right Ear: Hearing, tympanic membrane, ear canal and external ear normal. Tympanic membrane is not erythematous, retracted or bulging.     Left Ear: Hearing, tympanic membrane, ear canal and external ear normal. Tympanic membrane is not erythematous, retracted or bulging.     Nose: No mucosal edema or rhinorrhea.     Right Sinus: No maxillary sinus tenderness or frontal sinus tenderness.     Left Sinus: No maxillary sinus tenderness or frontal sinus tenderness.     Mouth/Throat:     Pharynx: Uvula midline.  Eyes:     General: Lids are normal. Lids are everted, no foreign bodies appreciated.     Conjunctiva/sclera: Conjunctivae normal.     Pupils: Pupils are equal, round, and reactive to light.  Neck:     Thyroid: No thyroid mass or thyromegaly.     Vascular: No carotid bruit.     Trachea: Trachea normal.  Cardiovascular:     Rate and Rhythm: Normal rate and regular rhythm.     Pulses: Normal pulses.     Heart sounds: Normal heart sounds, S1 normal and S2 normal. No murmur heard.    No friction rub. No gallop.  Pulmonary:     Effort: Pulmonary effort is normal. No tachypnea or respiratory distress.     Breath sounds: Normal breath sounds. No  decreased breath sounds, wheezing, rhonchi or rales.  Abdominal:     General: Bowel sounds are normal.     Palpations: Abdomen is soft.     Tenderness: There is no abdominal tenderness.  Musculoskeletal:     Cervical back: Normal range of motion and neck supple.     Comments: Well-healing surgical site left anterior knee with bandage in place, moderate swelling, no redness.  Skin:    General: Skin is warm and dry.     Findings: No rash.  Neurological:     Mental Status: She is alert.  Psychiatric:        Mood and Affect: Mood is not anxious or depressed.        Speech: Speech normal.        Behavior: Behavior normal. Behavior is cooperative.        Thought Content: Thought content normal.        Judgment: Judgment normal.       Results for orders placed or performed in visit on 05/17/23  Lipid panel  Result Value Ref Range   Cholesterol 235 (H) 0 - 200 mg/dL   Triglycerides 161.0 0.0 - 149.0 mg/dL   HDL 96.04 >54.09 mg/dL   VLDL 81.1 0.0 - 91.4 mg/dL   LDL Cholesterol 782 (H) 0 - 99 mg/dL   Total CHOL/HDL Ratio 5    NonHDL 189.88   Hemoglobin A1c  Result Value Ref Range   Hgb A1c MFr Bld 6.5 4.6 - 6.5 %  Comprehensive metabolic panel  Result Value Ref Range   Sodium 140 135 - 145 mEq/L   Potassium 3.9 3.5 - 5.1 mEq/L   Chloride 103 96 - 112 mEq/L   CO2 28 19 - 32 mEq/L   Glucose,  Bld 123 (H) 70 - 99 mg/dL   BUN 11 6 - 23 mg/dL   Creatinine, Ser 1.61 0.40 - 1.20 mg/dL   Total Bilirubin 0.8 0.2 - 1.2 mg/dL   Alkaline Phosphatase 95 39 - 117 U/L   AST 13 0 - 37 U/L   ALT 13 0 - 35 U/L   Total Protein 7.2 6.0 - 8.3 g/dL   Albumin 4.2 3.5 - 5.2 g/dL   GFR 09.60 >45.40 mL/min   Calcium 9.6 8.4 - 10.5 mg/dL    Assessment and Plan  Hypertension associated with diabetes (HCC) Assessment & Plan:  Chronic, lower BPs with weight loss of 35 lbs.  Will D/C amlodipine.  Follow BP at home.   Continue  Coreg 25 mg p.o. twice daily, hydrochlorothiazide 25 mg daily and  losartan 100 mg p.o. daily   Seasonal allergic rhinitis due to pollen -     Montelukast Sodium; Take 1 tablet (10 mg total) by mouth at bedtime.  Dispense: 90 tablet; Refill: 3  Congestion of paranasal sinus -     Montelukast Sodium; Take 1 tablet (10 mg total) by mouth at bedtime.  Dispense: 90 tablet; Refill: 3  Hyperlipidemia with target LDL less than 70 Assessment & Plan: Chronic,  LDL   far from  goal LD < 70 with CAD history Despite low cholesterol diet, zetia and crestor 20 mg daily.   She will discuss possible addition of PCSK2 inhibitor  (Repatha or Praluent) with cardiologist.   Type 2 diabetes mellitus with other circulatory complications, HTN, CVD (HCC) Assessment & Plan: Chronic, good control on Mounjaro 2.5 mg weekly, we will proceed with increasing this to 5 mg weekly for more significant weight loss and continued diabetes control.     Other orders -     Famotidine; Take 1 tablet (20 mg total) by mouth daily.  Dispense: 90 tablet; Refill: 3 -     Losartan Potassium; Take 1 tablet (100 mg total) by mouth daily.  Dispense: 90 tablet; Refill: 3 -     Tirzepatide; Inject 5 mg into the skin once a week.  Dispense: 2 mL; Refill: 11    Return in about 6 months (around 11/20/2023) for annual physical with fasting labs prior.   Kerby Nora, MD

## 2023-05-24 NOTE — Therapy (Unsigned)
OUTPATIENT PHYSICAL THERAPY TREATMENT   Patient Name: Christine Barrett MRN: [id number] DOB:20-May-1961, 62 y.o., female Today's Date: 05/25/2023   END OF SESSION:  PT End of Session - 05/25/23 0835     Visit Number 15    Number of Visits 24    Date for PT Re-Evaluation 07/25/23    Authorization Type [employer]    PT Start Time 0830    PT Stop Time 0910    PT Time Calculation (min) 40 min    Activity Tolerance Patient tolerated treatment well    Behavior During Therapy [facility] for tasks assessed/performed                Past Medical History:  Diagnosis Date   Allergy    SEASONAL   Anemia    Arthritis    CAD S/P percutaneous coronary angioplasty 09/2013   a) Ostial AV G Cx - 2.5 mm Angiosculpt; mid LAD 40-60%; b) Myoview 07/2014: LOW RISK, small-severe fixed inferior defect c/w infarct w/o peri-infarct ischemia.   Chronic bronchitis (HCC)    "frequently; not q yr" (10/03/2013)   Diabetes mellitus without complication (HCC)    Type 2-diet controlled.    Diverticulosis    Essential hypertension    with prior Accelerated HTN   GERD (gastroesophageal reflux disease)    Hiatal hernia    Hx of non-ST elevation myocardial infarction (NSTEMI) 10/01/2013   Due to Accelerated HTN with existing CAD   Hyperlipemia    Migraine    "@ least once/month" (10/03/2013)   Myocardial infarction (HCC)    OSA (obstructive sleep apnea) 06/10/2016   Schatzki's ring    Seasonal allergies    Seizure (HCC)    1 in elementary school, never knew what caused it   Sinusitis    Spinal headache    during C-section of second child.    Past Surgical History:  Procedure Laterality Date   ABDOMINAL HYSTERECTOMY  1994   "partial"   CAPSULAR RELEASE Right 06/06/2020   Procedure: RIGHT SHOULDER MANIPULATION UNDER ANESTHESIA, ROTATOR CUFF REPAIR;  Surgeon: Cammy Copa, MD;  Location: [facility] OR;  Service: Orthopedics;  Laterality: Right;   CARDIAC CATHETERIZATION N/A 08/05/2015   Procedure: Left Heart  Cath and Coronary Angiography;  Surgeon: Marykay Lex, MD;  Location: [facility] INVASIVE CV LAB;  Service: Cardiovascular;  Laterality: N/A;   CESAREAN SECTION  1989   COLONOSCOPY     CORONARY ANGIOPLASTY  10/03/2013   95% ostial AV G Cx - 2.5 mm AngioSculpt Balloon PTCA; mid LAD 40-60%   LEFT HEART CATHETERIZATION WITH CORONARY ANGIOGRAM N/A 10/02/2013   Procedure: LEFT HEART CATHETERIZATION WITH CORONARY ANGIOGRAM;  Surgeon: Lennette Bihari, MD;  Location: [facility] CATH LAB;  Service: Cardiovascular;  Laterality: N/A;   NASAL SEPTOPLASTY W/ TURBINOPLASTY  ~ 2007   NM MYOVIEW LTD  08/22/2014    Low risk stress nuclear study with a small, severe, fixed defect in the distal inferior wall/apex suggestive of small prior infarct; no ischemia.  LV Wall Motion:  NL LV Function; NL Wall Motion   PERCUTANEOUS CORONARY STENT INTERVENTION (PCI-S) N/A 10/03/2013   cutting balloon angioplasty only no stent.    SINOSCOPY     TOTAL KNEE ARTHROPLASTY Left 02/02/2023   Procedure: LEFT TOTAL KNEE ARTHROPLASTY;  Surgeon: Cammy Copa, MD;  Location: [facility] OR;  Service: Orthopedics;  Laterality: Left;   TRANSTHORACIC ECHOCARDIOGRAM  10/02/2013   EF 55-60%; mild LVH, no RWMA,    TUBAL LIGATION  1989  Patient Active Problem List   Diagnosis Date Noted   OA (osteoarthritis) of knee 02/02/2023   Status post total knee replacement, left 02/02/2023   Coronary artery disease involving native heart with angina pectoris, unspecified vessel or lesion type (HCC) 10/29/2021   Trochanteric bursitis of left hip 04/08/2021   Other allergic rhinitis 01/01/2021   Frequent episodes of sinusitis 01/01/2021   History of bronchitis 01/01/2021   Allergic conjunctivitis of both eyes 01/01/2021   Family history of breast cancer in sister 09/20/2020   Type 2 diabetes mellitus with other circulatory complications, HTN, CVD (HCC) 05/17/2020   Morbid obesity (HCC) 05/17/2020   Class 2 severe obesity with serious comorbidity and body mass  index (BMI) of 39.0 to 39.9 in adult (HCC) 05/17/2020   Acute pain of both knees 08/28/2016   OSA (obstructive sleep apnea) 06/10/2016   Costochondritis 06/09/2016   Bilateral leg cramps 02/07/2016   Hypertension associated with diabetes (HCC)    GERD (gastroesophageal reflux disease) 08/10/2014   Vaginal dryness, menopausal 08/10/2014   CAD S/P percutaneous coronary angioplasty - Ostial AVG Cx - Scoring balloon PTCA 10/03/2013   Hyperlipidemia with target LDL less than 70 10/02/2013   H/O non-ST elevation myocardial infarction (NSTEMI) 10/02/2013   Normocytic anemia, not due to blood loss    Allergic sinusitis 03/17/2007   Migraine without aura 03/16/2007    PCP: Excell Seltzer, MD   REFERRING PROVIDER: Julieanne Cotton, PA-C  REFERRING DIAG: [phone] (ICD-10-CM) - Status post total knee replacement, left  THERAPY DIAG:  Muscle weakness (generalized)  Other abnormalities of gait and mobility  Acute pain of left knee  Rationale for Evaluation and Treatment: Rehabilitation  ONSET DATE: 02/02/23 DOS   SUBJECTIVE:  SUBJECTIVE STATEMENT: Minimal symptoms to note.  Rates herself at 90%  PERTINENT HISTORY: See PMH above  PAIN:  Are you having pain? Yes:  NPRS scale: 7/10 Pain location: Left knee Pain description: Sore, stiff Aggravating factors: ROM, activity Relieving factors: Rest, ice, heat  PRECAUTIONS: Knee  PATIENT GOALS: To regain the function in my L knee   OBJECTIVE:  PATIENT SURVEYS:   FOTO: 55 (61 predicted)   03/23/2023: 47% functional status  MUSCLE LENGTH: deferred  LOWER EXTREMITY ROM:  A/PROM Right eval Left eval Left 03/05/2023 Left 03/09/23 Left 03/18/23 Left 03/26/2023 Left  03/31/23 Left 04/15/23 L 05/25/23  Hip flexion           Hip extension           Hip abduction           Hip adduction           Hip internal rotation           Hip external rotation           Knee flexion  75/80d 85 AROM / 90 PROM 87 PROM 94 AAROM strap 97 AAROM  98 AAROM 100 AAROM 118d  Knee extension  -25/-10d -6 AROM -5 AROM -5 AROM Lacking 3     Ankle dorsiflexion           Ankle plantarflexion           Ankle inversion           Ankle eversion            (Blank rows = not tested)  LOWER EXTREMITY MMT:  MMT Right eval Left eval L 04/02/23 L 05/25/23  Hip flexion      Hip extension  Hip abduction      Hip adduction      Hip internal rotation      Hip external rotation      Knee flexion  3 3+ 4-  Knee extension  3- 3 4-  Ankle dorsiflexion      Ankle plantarflexion      Ankle inversion      Ankle eversion       (Blank rows = not tested)  FUNCTIONAL TESTS:  30s chair stand 1 rep  GAIT: Distance walked: 48ft x2 Assistive device utilized: Environmental consultant - 2 wheeled Level of assistance: Modified independence Comments: slow cadence with step through pattern   TODAY'S TREATMENT:  [facility] Adult PT Treatment:                                                DATE: 05/25/23 Therapeutic Exercise: Nustep L4 8 min SLR L with QS to fatigue 15 reps SAQs 4# x15 Heel slides with strap over slide board 15x 118d flexion FAQs with towel roll 15x 4# TKEs BluTB x15 Supine hip fallouts with BluTB 15x Heel raises over 4 in step 2x10 Slant board gastroc stretch 2x30"   [facility] Adult PT Treatment:                                                DATE: 04/26/23 Therapeutic Exercise: Nustep L4 6 min(patient monitored as she tends to pause during tasks which also pauses time) SLR with QS 15x Heel slides 15x 115d  SAQs 3# 15x2 2s hold TKE BluTB 15x 10 min ice pack (knee extended)   [facility] Adult PT Treatment:                                                DATE: 04/22/23 Therapeutic Exercise: Recumbent bike partial revolutions x 5 min to work on knee flexion (after 2 minutes able to make full revolutions, slight hip compensation) SLR L with QS to fatigue 15 reps SAQs 3# x15 Heel slides with strap over slide board 10x 94d initial, 100 final FAQs with  adduction 15x TKEs GTB x15 Heel raises over 4 in step 2x10 Slant board gastroc stretch 2x30" Modalities: Vasopneumatic (Game Ready)   Location:  left knee Time:  10 minutes Pressure:  low Temperature:  34 degrees   [facility] Adult PT Treatment:                                                DATE: 04/15/23 Therapeutic Exercise: Recumbent bike partial revolutions x 5 min to work on knee flexion SLR L with QS to fatigue 20 reps SAQs 3# x15 Heel slides with strap over slide board 15x 92d initial, 100 final FAQs with adduction 15x TKEs GTB x10 Heel raises over 4 in step 2x10 Slant board gastroc stretch 2x30" Modalities: Pt req MHP at EOS today, positioned in supine with bolster x10 mins post session     [facility] Adult PT Treatment:  DATE: 04/07/23 Therapeutic Exercise: Recumbent bike partial revolutions x 5 min to work on knee flexion SLR L with QS 15x SAQs 3# 15x2  Heel slides with strap over slide board 15x 85d initial, 100 final FAQs with adduction 15x TKEs GTB x10 Heel raises over 4 in step 2x10 Modalities: Cold pack 10 min post session (held vaso to quantify post treatment soreness)    PATIENT EDUCATION:  Education details: HEP Person educated: Patient Education method: Explanation Education comprehension: verbalized understanding and needs further education  HOME EXERCISE PROGRAM: Access Code: [email or web address]   ASSESSMENT: CLINICAL IMPRESSION: Patient returns to [facility] following prolonged illness.  Has been released by ortho office.  All STGs met, now ambulating w/o AD 51ft +, 118d knee flexion.  Still lacks <5d TKE due to VMO weakness.  Needs to address stair negotiation and FOTO next session as well as 30s stand test.    OBJECTIVE IMPAIRMENTS: Abnormal gait, decreased activity tolerance, decreased coordination, decreased endurance, decreased knowledge of condition, decreased knowledge of use of DME, decreased mobility, difficulty  walking, decreased ROM, decreased strength, improper body mechanics, and pain.   ACTIVITY LIMITATIONS: carrying, lifting, standing, squatting, and stairs  PERSONAL FACTORS: Age, Fitness, Past/current experiences, and 1 comorbidity: DM  are also affecting patient's functional outcome.    GOALS: Goals reviewed with patient? No  SHORT TERM GOALS: Target date: 03/26/2023   Patient to demonstrate independence in HEP  Baseline: [id number] Goal status: Met  2.  Increase L knee AROM to 90d flexion and -10d extension Baseline:  A/PROM Right eval Left eval Left 03/05/2023 Left 03/09/23 Left 03/18/23 Left 03/26/2023 Left  03/31/23 Left 04/15/23 L 05/25/23  Hip flexion           Hip extension           Hip abduction           Hip adduction           Hip internal rotation           Hip external rotation           Knee flexion  75/80d 85 AROM / 90 PROM 87 PROM 94 AAROM strap 97 AAROM 98 AAROM 100 AAROM 118d  Knee extension  -25/-10d -6 AROM -5 AROM -5 AROM Lacking 3     Ankle dorsiflexion           Ankle plantarflexion           Ankle inversion           Ankle eversion            03/26/2023: see above Goal status: MET  3.  Decrease pain to 4/10 Baseline: 8/10; 4-5/10 pain vs soreness Goal status: Met  LONG TERM GOALS: Target date: 06/23/2023   Able to ascend 16 steps with LRAD and most appropriate pattern Baseline: 4 steps with B rails and step to pattern; 05/25/23  Goal status: INITIAL  2.  Increases L knee ROM to -5d extension and 120d flexion Baseline:  A/PROM Right eval Left eval  Hip flexion    Hip extension    Hip abduction    Hip adduction    Hip internal rotation    Hip external rotation    Knee flexion  75/80d  Knee extension  -25/-10d   Goal status: INITIAL  3.  Increase L knee strength to 4/5 Baseline:  MMT Right eval Left eval L 04/02/23 L 05/25/23  Hip flexion      Hip extension  Hip abduction      Hip adduction      Hip internal rotation       Hip external rotation      Knee flexion  3 3+ 4-  Knee extension  3- 3 4-  Ankle dorsiflexion      Ankle plantarflexion      Ankle inversion      Ankle eversion       Goal status: Progressing  4.  567ft ambulation with LRAD Baseline: 36ft with RW; 05/25/23 515ft + w/o AD Goal status: Met  5.  Increase FOTO score to 61 Baseline: 55 03/23/2023: 47% functional status Goal status: ONGOING  6.  Increase reps to 6 on 30s chair stand test Baseline: 1 rep Goal status: INITIAL   PLAN: PT FREQUENCY: 1-2x/week  PT DURATION: 4 weeks  PLANNED INTERVENTIONS: Therapeutic exercises, Therapeutic activity, Neuromuscular re-education, Balance training, Gait training, Patient/Family education, Self Care, Joint mobilization, Stair training, DME instructions, Dry Needling, Electrical stimulation, Cryotherapy, Moist heat, scar mobilization, Manual therapy, and Re-evaluation  PLAN FOR NEXT SESSION: HEP review and update, manual techniques as appropriate, aerobic tasks, ROM and flexibility activities, strengthening and PREs, TPDN, gait and balance training as needed    Hildred Laser PT 05/25/23  4:00 PM Phone: [phone] Fax: [phone]

## 2023-05-25 ENCOUNTER — Ambulatory Visit: Payer: Medicaid Other | Attending: Surgical

## 2023-05-25 ENCOUNTER — Ambulatory Visit: Payer: BC Managed Care – PPO | Admitting: Family Medicine

## 2023-05-25 DIAGNOSIS — M25562 Pain in left knee: Secondary | ICD-10-CM | POA: Diagnosis present

## 2023-05-25 DIAGNOSIS — R2689 Other abnormalities of gait and mobility: Secondary | ICD-10-CM | POA: Insufficient documentation

## 2023-05-25 DIAGNOSIS — M6281 Muscle weakness (generalized): Secondary | ICD-10-CM | POA: Insufficient documentation

## 2023-05-25 NOTE — Addendum Note (Signed)
Addended by: Hildred Laser on: 05/25/2023 04:01 PM   Modules accepted: Orders

## 2023-06-01 ENCOUNTER — Ambulatory Visit: Payer: Medicaid Other | Attending: Surgical

## 2023-06-01 DIAGNOSIS — M6281 Muscle weakness (generalized): Secondary | ICD-10-CM | POA: Diagnosis present

## 2023-06-01 DIAGNOSIS — M25562 Pain in left knee: Secondary | ICD-10-CM | POA: Diagnosis present

## 2023-06-01 DIAGNOSIS — R600 Localized edema: Secondary | ICD-10-CM | POA: Diagnosis present

## 2023-06-01 DIAGNOSIS — R2689 Other abnormalities of gait and mobility: Secondary | ICD-10-CM | POA: Insufficient documentation

## 2023-06-01 NOTE — Therapy (Signed)
OUTPATIENT PHYSICAL THERAPY TREATMENT   Patient Name: Christine Barrett MRN: [id number] DOB:[birth year], 62 y.o., female Today's Date: 06/01/2023   END OF SESSION:  PT End of Session - 06/01/23 0831     Visit Number 16    Number of Visits 24    Date for PT Re-Evaluation 07/25/23    Authorization Type [employer]    PT Start Time 0830    PT Stop Time 0910    PT Time Calculation (min) 40 min    Activity Tolerance Patient tolerated treatment well    Behavior During Therapy [facility] for tasks assessed/performed             Past Medical History:  Diagnosis Date   Allergy    SEASONAL   Anemia    Arthritis    CAD S/P percutaneous coronary angioplasty 09/2013   a) Ostial AV G Cx - 2.5 mm Angiosculpt; mid LAD 40-60%; b) Myoview 07/2014: LOW RISK, small-severe fixed inferior defect c/w infarct w/o peri-infarct ischemia.   Chronic bronchitis (HCC)    "frequently; not q yr" (10/03/2013)   Diabetes mellitus without complication (HCC)    Type 2-diet controlled.    Diverticulosis    Essential hypertension    with prior Accelerated HTN   GERD (gastroesophageal reflux disease)    Hiatal hernia    Hx of non-ST elevation myocardial infarction (NSTEMI) 10/01/2013   Due to Accelerated HTN with existing CAD   Hyperlipemia    Migraine    "@ least once/month" (10/03/2013)   Myocardial infarction (HCC)    OSA (obstructive sleep apnea) 06/10/2016   Schatzki's ring    Seasonal allergies    Seizure (HCC)    1 in elementary school, never knew what caused it   Sinusitis    Spinal headache    during C-section of second child.    Past Surgical History:  Procedure Laterality Date   ABDOMINAL HYSTERECTOMY  1994   "partial"   CAPSULAR RELEASE Right 06/06/2020   Procedure: RIGHT SHOULDER MANIPULATION UNDER ANESTHESIA, ROTATOR CUFF REPAIR;  Surgeon: Cammy Copa, MD;  Location: [facility] OR;  Service: Orthopedics;  Laterality: Right;   CARDIAC CATHETERIZATION N/A 08/05/2015   Procedure: Left Heart Cath and  Coronary Angiography;  Surgeon: Marykay Lex, MD;  Location: [facility] INVASIVE CV LAB;  Service: Cardiovascular;  Laterality: N/A;   CESAREAN SECTION  1989   COLONOSCOPY     CORONARY ANGIOPLASTY  10/03/2013   95% ostial AV G Cx - 2.5 mm AngioSculpt Balloon PTCA; mid LAD 40-60%   LEFT HEART CATHETERIZATION WITH CORONARY ANGIOGRAM N/A 10/02/2013   Procedure: LEFT HEART CATHETERIZATION WITH CORONARY ANGIOGRAM;  Surgeon: Lennette Bihari, MD;  Location: [facility] CATH LAB;  Service: Cardiovascular;  Laterality: N/A;   NASAL SEPTOPLASTY W/ TURBINOPLASTY  ~ 2007   NM MYOVIEW LTD  08/22/2014    Low risk stress nuclear study with a small, severe, fixed defect in the distal inferior wall/apex suggestive of small prior infarct; no ischemia.  LV Wall Motion:  NL LV Function; NL Wall Motion   PERCUTANEOUS CORONARY STENT INTERVENTION (PCI-S) N/A 10/03/2013   cutting balloon angioplasty only no stent.    SINOSCOPY     TOTAL KNEE ARTHROPLASTY Left 02/02/2023   Procedure: LEFT TOTAL KNEE ARTHROPLASTY;  Surgeon: Cammy Copa, MD;  Location: [facility] OR;  Service: Orthopedics;  Laterality: Left;   TRANSTHORACIC ECHOCARDIOGRAM  10/02/2013   EF 55-60%; mild LVH, no RWMA,    TUBAL LIGATION  1989   Patient Active  Problem List   Diagnosis Date Noted   OA (osteoarthritis) of knee 02/02/2023   Status post total knee replacement, left 02/02/2023   Coronary artery disease involving native heart with angina pectoris, unspecified vessel or lesion type (HCC) 10/29/2021   Trochanteric bursitis of left hip 04/08/2021   Other allergic rhinitis 01/01/2021   Frequent episodes of sinusitis 01/01/2021   History of bronchitis 01/01/2021   Allergic conjunctivitis of both eyes 01/01/2021   Family history of breast cancer in sister 09/20/2020   Type 2 diabetes mellitus with other circulatory complications, HTN, CVD (HCC) 05/17/2020   Morbid obesity (HCC) 05/17/2020   Class 2 severe obesity with serious comorbidity and body mass index  (BMI) of 39.0 to 39.9 in adult (HCC) 05/17/2020   Acute pain of both knees 08/28/2016   OSA (obstructive sleep apnea) 06/10/2016   Costochondritis 06/09/2016   Bilateral leg cramps 02/07/2016   Hypertension associated with diabetes (HCC)    GERD (gastroesophageal reflux disease) 08/10/2014   Vaginal dryness, menopausal 08/10/2014   CAD S/P percutaneous coronary angioplasty - Ostial AVG Cx - Scoring balloon PTCA 10/03/2013   Hyperlipidemia with target LDL less than 70 10/02/2013   H/O non-ST elevation myocardial infarction (NSTEMI) 10/02/2013   Normocytic anemia, not due to blood loss    Allergic sinusitis 03/17/2007   Migraine without aura 03/16/2007    PCP: Excell Seltzer, MD   REFERRING PROVIDER: Julieanne Cotton, PA-C  REFERRING DIAG: [phone] (ICD-10-CM) - Status post total knee replacement, left  THERAPY DIAG:  Muscle weakness (generalized)  Other abnormalities of gait and mobility  Acute pain of left knee  Localized edema  Rationale for Evaluation and Treatment: Rehabilitation  ONSET DATE: 02/02/23 DOS   SUBJECTIVE:  SUBJECTIVE STATEMENT: Patient reports continued stiffness on lateral side of her Lt knee.   PERTINENT HISTORY: See PMH above  PAIN:  Are you having pain? Yes:  NPRS scale: 7/10 Pain location: Left knee Pain description: Sore, stiff Aggravating factors: ROM, activity Relieving factors: Rest, ice, heat  PRECAUTIONS: Knee  PATIENT GOALS: To regain the function in my L knee   OBJECTIVE:  PATIENT SURVEYS:   FOTO: 55 (61 predicted)   03/23/2023: 47% functional status 06/01/23: 59%   MUSCLE LENGTH: deferred  LOWER EXTREMITY ROM:  A/PROM Right eval Left eval Left 03/05/2023 Left 03/09/23 Left 03/18/23 Left 03/26/2023 Left  03/31/23 Left 04/15/23 L 05/25/23  Hip flexion           Hip extension           Hip abduction           Hip adduction           Hip internal rotation           Hip external rotation           Knee flexion  75/80d  85 AROM / 90 PROM 87 PROM 94 AAROM strap 97 AAROM 98 AAROM 100 AAROM 118d  Knee extension  -25/-10d -6 AROM -5 AROM -5 AROM Lacking 3     Ankle dorsiflexion           Ankle plantarflexion           Ankle inversion           Ankle eversion            (Blank rows = not tested)  LOWER EXTREMITY MMT:  MMT Right eval Left eval L 04/02/23 L 05/25/23  Hip flexion  Hip extension      Hip abduction      Hip adduction      Hip internal rotation      Hip external rotation      Knee flexion  3 3+ 4-  Knee extension  3- 3 4-  Ankle dorsiflexion      Ankle plantarflexion      Ankle inversion      Ankle eversion       (Blank rows = not tested)  FUNCTIONAL TESTS:  30s chair stand 1 rep 06/01/23: 9 reps  GAIT: Distance walked: 19ft x2 Assistive device utilized: Environmental consultant - 2 wheeled Level of assistance: Modified independence Comments: slow cadence with step through pattern   TODAY'S TREATMENT:  [facility] Adult PT Treatment:                                                DATE: 06/01/23 Therapeutic Exercise: Nustep L5 8 min Step ups 8" fwd/lat x10 ea BIL SLR L with QS to fatigue 15 reps SAQs 4# x15 Heel slides with strap over slide board 15x 118d flexion FAQs with towel roll 15x 4# TKEs BluTB x15 Supine hip fallouts with BluTB 15x Heel raises over 4 in step 2x10 Slant board gastroc stretch 2x30" Therapeutic Activity: 30" STS 9 reps FOTO 59%   [facility] Adult PT Treatment:                                                DATE: 05/25/23 Therapeutic Exercise: Nustep L4 8 min SLR L with QS to fatigue 15 reps SAQs 4# x15 Heel slides with strap over slide board 15x 118d flexion FAQs with towel roll 15x 4# TKEs BluTB x15 Supine hip fallouts with BluTB 15x Heel raises over 4 in step 2x10 Slant board gastroc stretch 2x30"   [facility] Adult PT Treatment:                                                DATE: 04/26/23 Therapeutic Exercise: Nustep L4 6 min(patient monitored as she tends to pause  during tasks which also pauses time) SLR with QS 15x Heel slides 15x 115d  SAQs 3# 15x2 2s hold TKE BluTB 15x 10 min ice pack (knee extended)    PATIENT EDUCATION:  Education details: HEP Person educated: Patient Education method: Explanation Education comprehension: verbalized understanding and needs further education  HOME EXERCISE PROGRAM: Access Code: [email or web address]   ASSESSMENT: CLINICAL IMPRESSION:  Patient presents to PT reporting continued stiffness on lateral portion of her Lt knee. Re-administered FOTO this session with patient improving score to 59% as well as 30" STS with patient exceeding LTG with 9 reps performed. Worked on stair training today with patient endorsing mild pain and displaying some difficulty initially clearing toes, improves with verbal cuing. Patient was able to tolerate all prescribed exercises with no adverse effects. Patient continues to benefit from skilled PT services and should be progressed as able to improve functional independence.     OBJECTIVE IMPAIRMENTS: Abnormal gait, decreased activity tolerance, decreased coordination, decreased endurance, decreased knowledge of condition, decreased knowledge  of use of DME, decreased mobility, difficulty walking, decreased ROM, decreased strength, improper body mechanics, and pain.   ACTIVITY LIMITATIONS: carrying, lifting, standing, squatting, and stairs  PERSONAL FACTORS: Age, Fitness, Past/current experiences, and 1 comorbidity: DM  are also affecting patient's functional outcome.    GOALS: Goals reviewed with patient? No  SHORT TERM GOALS: Target date: 03/26/2023   Patient to demonstrate independence in HEP  Baseline: [email or web address] Goal status: Met  2.  Increase L knee AROM to 90d flexion and -10d extension Baseline:  A/PROM Right eval Left eval Left 03/05/2023 Left 03/09/23 Left 03/18/23 Left 03/26/2023 Left  03/31/23 Left 04/15/23 L 05/25/23  Hip flexion           Hip extension           Hip  abduction           Hip adduction           Hip internal rotation           Hip external rotation           Knee flexion  75/80d 85 AROM / 90 PROM 87 PROM 94 AAROM strap 97 AAROM 98 AAROM 100 AAROM 118d  Knee extension  -25/-10d -6 AROM -5 AROM -5 AROM Lacking 3     Ankle dorsiflexion           Ankle plantarflexion           Ankle inversion           Ankle eversion            03/26/2023: see above Goal status: MET  3.  Decrease pain to 4/10 Baseline: 8/10; 4-5/10 pain vs soreness Goal status: Met  LONG TERM GOALS: Target date: 06/23/2023   Able to ascend 16 steps with LRAD and most appropriate pattern Baseline: 4 steps with B rails and step to pattern; 05/25/23  Goal status: INITIAL  2.  Increases L knee ROM to -5d extension and 120d flexion Baseline:  A/PROM Right eval Left eval  Hip flexion    Hip extension    Hip abduction    Hip adduction    Hip internal rotation    Hip external rotation    Knee flexion  75/80d  Knee extension  -25/-10d   Goal status: INITIAL  3.  Increase L knee strength to 4/5 Baseline:  MMT Right eval Left eval L 04/02/23 L 05/25/23  Hip flexion      Hip extension      Hip abduction      Hip adduction      Hip internal rotation      Hip external rotation      Knee flexion  3 3+ 4-  Knee extension  3- 3 4-  Ankle dorsiflexion      Ankle plantarflexion      Ankle inversion      Ankle eversion       Goal status: Progressing  4.  553ft ambulation with LRAD Baseline: 78ft with RW; 05/25/23 579ft + w/o AD Goal status: Met  5.  Increase FOTO score to 61 Baseline: 55 03/23/2023: 47% functional status Goal status: Progressing 06/01/23: 59%  6.  Increase reps to 6 on 30s chair stand test Baseline: 1 rep Goal status: MET 06/01/23: 9 reps   PLAN: PT FREQUENCY: 1-2x/week  PT DURATION: 4 weeks  PLANNED INTERVENTIONS: Therapeutic exercises, Therapeutic activity, Neuromuscular re-education, Balance training, Gait training,  Patient/Family education, Self Care, Joint mobilization, Stair training,  DME instructions, Dry Needling, Electrical stimulation, Cryotherapy, Moist heat, scar mobilization, Manual therapy, and Re-evaluation  PLAN FOR NEXT SESSION: HEP review and update, manual techniques as appropriate, aerobic tasks, ROM and flexibility activities, strengthening and PREs, TPDN, gait and balance training as needed    Berta Minor PTA 06/01/23  9:07 AM Phone: [phone] Fax: [phone]

## 2023-06-03 ENCOUNTER — Ambulatory Visit: Payer: Medicaid Other

## 2023-06-03 DIAGNOSIS — M6281 Muscle weakness (generalized): Secondary | ICD-10-CM | POA: Diagnosis not present

## 2023-06-03 DIAGNOSIS — R600 Localized edema: Secondary | ICD-10-CM

## 2023-06-03 DIAGNOSIS — R2689 Other abnormalities of gait and mobility: Secondary | ICD-10-CM

## 2023-06-03 DIAGNOSIS — M25562 Pain in left knee: Secondary | ICD-10-CM

## 2023-06-03 NOTE — Therapy (Signed)
OUTPATIENT PHYSICAL THERAPY TREATMENT   Patient Name: Christine Barrett MRN: [id number] DOB:August 12, 1960, 62 y.o., female Today's Date: 06/03/2023   END OF SESSION:  PT End of Session - 06/03/23 0915     Visit Number 17    Number of Visits 24    Date for PT Re-Evaluation 07/25/23    Authorization Type [employer]    PT Start Time 0915    PT Stop Time 1000    PT Time Calculation (min) 45 min    Activity Tolerance Patient tolerated treatment well    Behavior During Therapy [facility] for tasks assessed/performed              Past Medical History:  Diagnosis Date   Allergy    SEASONAL   Anemia    Arthritis    CAD S/P percutaneous coronary angioplasty 09/2013   a) Ostial AV G Cx - 2.5 mm Angiosculpt; mid LAD 40-60%; b) Myoview 07/2014: LOW RISK, small-severe fixed inferior defect c/w infarct w/o peri-infarct ischemia.   Chronic bronchitis (HCC)    "frequently; not q yr" (10/03/2013)   Diabetes mellitus without complication (HCC)    Type 2-diet controlled.    Diverticulosis    Essential hypertension    with prior Accelerated HTN   GERD (gastroesophageal reflux disease)    Hiatal hernia    Hx of non-ST elevation myocardial infarction (NSTEMI) 10/01/2013   Due to Accelerated HTN with existing CAD   Hyperlipemia    Migraine    "@ least once/month" (10/03/2013)   Myocardial infarction (HCC)    OSA (obstructive sleep apnea) 06/10/2016   Schatzki's ring    Seasonal allergies    Seizure (HCC)    1 in elementary school, never knew what caused it   Sinusitis    Spinal headache    during C-section of second child.    Past Surgical History:  Procedure Laterality Date   ABDOMINAL HYSTERECTOMY  1994   "partial"   CAPSULAR RELEASE Right 06/06/2020   Procedure: RIGHT SHOULDER MANIPULATION UNDER ANESTHESIA, ROTATOR CUFF REPAIR;  Surgeon: Cammy Copa, MD;  Location: [facility] OR;  Service: Orthopedics;  Laterality: Right;   CARDIAC CATHETERIZATION N/A 08/05/2015   Procedure: Left Heart Cath and  Coronary Angiography;  Surgeon: Marykay Lex, MD;  Location: [facility] INVASIVE CV LAB;  Service: Cardiovascular;  Laterality: N/A;   CESAREAN SECTION  1989   COLONOSCOPY     CORONARY ANGIOPLASTY  10/03/2013   95% ostial AV G Cx - 2.5 mm AngioSculpt Balloon PTCA; mid LAD 40-60%   LEFT HEART CATHETERIZATION WITH CORONARY ANGIOGRAM N/A 10/02/2013   Procedure: LEFT HEART CATHETERIZATION WITH CORONARY ANGIOGRAM;  Surgeon: Lennette Bihari, MD;  Location: [facility] CATH LAB;  Service: Cardiovascular;  Laterality: N/A;   NASAL SEPTOPLASTY W/ TURBINOPLASTY  ~ 2007   NM MYOVIEW LTD  08/22/2014    Low risk stress nuclear study with a small, severe, fixed defect in the distal inferior wall/apex suggestive of small prior infarct; no ischemia.  LV Wall Motion:  NL LV Function; NL Wall Motion   PERCUTANEOUS CORONARY STENT INTERVENTION (PCI-S) N/A 10/03/2013   cutting balloon angioplasty only no stent.    SINOSCOPY     TOTAL KNEE ARTHROPLASTY Left 02/02/2023   Procedure: LEFT TOTAL KNEE ARTHROPLASTY;  Surgeon: Cammy Copa, MD;  Location: [facility] OR;  Service: Orthopedics;  Laterality: Left;   TRANSTHORACIC ECHOCARDIOGRAM  10/02/2013   EF 55-60%; mild LVH, no RWMA,    TUBAL LIGATION  1989   Patient  Active Problem List   Diagnosis Date Noted   OA (osteoarthritis) of knee 02/02/2023   Status post total knee replacement, left 02/02/2023   Coronary artery disease involving native heart with angina pectoris, unspecified vessel or lesion type (HCC) 10/29/2021   Trochanteric bursitis of left hip 04/08/2021   Other allergic rhinitis 01/01/2021   Frequent episodes of sinusitis 01/01/2021   History of bronchitis 01/01/2021   Allergic conjunctivitis of both eyes 01/01/2021   Family history of breast cancer in sister 09/20/2020   Type 2 diabetes mellitus with other circulatory complications, HTN, CVD (HCC) 05/17/2020   Morbid obesity (HCC) 05/17/2020   Class 2 severe obesity with serious comorbidity and body mass index  (BMI) of 39.0 to 39.9 in adult (HCC) 05/17/2020   Acute pain of both knees 08/28/2016   OSA (obstructive sleep apnea) 06/10/2016   Costochondritis 06/09/2016   Bilateral leg cramps 02/07/2016   Hypertension associated with diabetes (HCC)    GERD (gastroesophageal reflux disease) 08/10/2014   Vaginal dryness, menopausal 08/10/2014   CAD S/P percutaneous coronary angioplasty - Ostial AVG Cx - Scoring balloon PTCA 10/03/2013   Hyperlipidemia with target LDL less than 70 10/02/2013   H/O non-ST elevation myocardial infarction (NSTEMI) 10/02/2013   Normocytic anemia, not due to blood loss    Allergic sinusitis 03/17/2007   Migraine without aura 03/16/2007    PCP: Excell Seltzer, MD   REFERRING PROVIDER: Julieanne Cotton, PA-C  REFERRING DIAG: [phone] (ICD-10-CM) - Status post total knee replacement, left  THERAPY DIAG:  Muscle weakness (generalized)  Other abnormalities of gait and mobility  Acute pain of left knee  Localized edema  Rationale for Evaluation and Treatment: Rehabilitation  ONSET DATE: 02/02/23 DOS   SUBJECTIVE:  SUBJECTIVE STATEMENT: Patient reports continued stiffness on lateral side of her Lt knee.   PERTINENT HISTORY: See PMH above  PAIN:  Are you having pain? Yes:  NPRS scale: 7/10 Pain location: Left knee Pain description: Sore, stiff Aggravating factors: ROM, activity Relieving factors: Rest, ice, heat  PRECAUTIONS: Knee  PATIENT GOALS: To regain the function in my L knee   OBJECTIVE:  PATIENT SURVEYS:   FOTO: 55 (61 predicted)   03/23/2023: 47% functional status 06/01/23: 59%   MUSCLE LENGTH: deferred  LOWER EXTREMITY ROM:  A/PROM Right eval Left eval Left 03/05/2023 Left 03/09/23 Left 03/18/23 Left 03/26/2023 Left  03/31/23 Left 04/15/23 L 05/25/23  Hip flexion           Hip extension           Hip abduction           Hip adduction           Hip internal rotation           Hip external rotation           Knee flexion  75/80d  85 AROM / 90 PROM 87 PROM 94 AAROM strap 97 AAROM 98 AAROM 100 AAROM 118d  Knee extension  -25/-10d -6 AROM -5 AROM -5 AROM Lacking 3     Ankle dorsiflexion           Ankle plantarflexion           Ankle inversion           Ankle eversion            (Blank rows = not tested)  LOWER EXTREMITY MMT:  MMT Right eval Left eval L 04/02/23 L 05/25/23  Hip flexion  Hip extension      Hip abduction      Hip adduction      Hip internal rotation      Hip external rotation      Knee flexion  3 3+ 4-  Knee extension  3- 3 4-  Ankle dorsiflexion      Ankle plantarflexion      Ankle inversion      Ankle eversion       (Blank rows = not tested)  FUNCTIONAL TESTS:  30s chair stand 1 rep 06/01/23: 9 reps  GAIT: Distance walked: 58ft x2 Assistive device utilized: Environmental consultant - 2 wheeled Level of assistance: Modified independence Comments: slow cadence with step through pattern   TODAY'S TREATMENT:  [facility] Adult PT Treatment:                                                DATE: 06/03/23 Therapeutic Exercise: Nustep L5 8 min Heel taps on 2" steps x10, x5 - cues for form Step ups step with 2 risers fwd/lat x10 ea Lt leading TKEs ball against wall - cues to decrease hip compensation x10 Heel raises over 4 in step 2x10 Slant board gastroc stretch 2x30" Stairs - step to and step over step pattern - difficulty on descent Modalities: Vasopneumatic (Game Ready)   Location:  left knee Time:  10 minutes Pressure:  low Temperature:  34 degrees    [facility] Adult PT Treatment:                                                DATE: 06/01/23 Therapeutic Exercise: Nustep L5 8 min Step ups 8" fwd/lat x10 ea BIL TKEs BluTB x15 Heel raises over 4 in step 2x10 Slant board gastroc stretch 2x30" Therapeutic Activity: 30" STS 9 reps FOTO 59%   [facility] Adult PT Treatment:                                                DATE: 05/25/23 Therapeutic Exercise: Nustep L4 8 min SLR L with QS to fatigue 15  reps SAQs 4# x15 Heel slides with strap over slide board 15x 118d flexion FAQs with towel roll 15x 4# TKEs BluTB x15 Supine hip fallouts with BluTB 15x Heel raises over 4 in step 2x10 Slant board gastroc stretch 2x30"    PATIENT EDUCATION:  Education details: HEP Person educated: Patient Education method: Explanation Education comprehension: verbalized understanding and needs further education  HOME EXERCISE PROGRAM: Access Code: [email or web address]   ASSESSMENT: CLINICAL IMPRESSION:  Patient presents to PT reporting continued pain on the lateral portion of her Lt knee, especially when she tries to sleep on the Lt side and when getting out of the car. Session today focused on quad strengthening with particular focus on stair training. She has difficulty with descent partly due to fear and weakness in the quads. Vaso utilized end of session for increased soreness. Patient was able to tolerate all prescribed exercises with no adverse effects. Patient continues to benefit from skilled PT services and should be progressed as able to improve functional independence.  OBJECTIVE IMPAIRMENTS: Abnormal gait, decreased activity tolerance, decreased coordination, decreased endurance, decreased knowledge of condition, decreased knowledge of use of DME, decreased mobility, difficulty walking, decreased ROM, decreased strength, improper body mechanics, and pain.   ACTIVITY LIMITATIONS: carrying, lifting, standing, squatting, and stairs  PERSONAL FACTORS: Age, Fitness, Past/current experiences, and 1 comorbidity: DM  are also affecting patient's functional outcome.    GOALS: Goals reviewed with patient? No  SHORT TERM GOALS: Target date: 03/26/2023   Patient to demonstrate independence in HEP  Baseline: [email or web address] Goal status: Met  2.  Increase L knee AROM to 90d flexion and -10d extension Baseline:  A/PROM Right eval Left eval Left 03/05/2023 Left 03/09/23 Left 03/18/23 Left 03/26/2023 Left   03/31/23 Left 04/15/23 L 05/25/23  Hip flexion           Hip extension           Hip abduction           Hip adduction           Hip internal rotation           Hip external rotation           Knee flexion  75/80d 85 AROM / 90 PROM 87 PROM 94 AAROM strap 97 AAROM 98 AAROM 100 AAROM 118d  Knee extension  -25/-10d -6 AROM -5 AROM -5 AROM Lacking 3     Ankle dorsiflexion           Ankle plantarflexion           Ankle inversion           Ankle eversion            03/26/2023: see above Goal status: MET  3.  Decrease pain to 4/10 Baseline: 8/10; 4-5/10 pain vs soreness Goal status: Met  LONG TERM GOALS: Target date: 06/23/2023   Able to ascend 16 steps with LRAD and most appropriate pattern Baseline: 4 steps with B rails and step to pattern; 05/25/23 06/03/23: 4 steps with single rail and step over step pattern with mild pain Goal status: Progressing  2.  Increases L knee ROM to -5d extension and 120d flexion Baseline:  A/PROM Right eval Left eval  Hip flexion    Hip extension    Hip abduction    Hip adduction    Hip internal rotation    Hip external rotation    Knee flexion  75/80d  Knee extension  -25/-10d   Goal status: Progressing  3.  Increase L knee strength to 4/5 Baseline:  MMT Right eval Left eval L 04/02/23 L 05/25/23  Hip flexion      Hip extension      Hip abduction      Hip adduction      Hip internal rotation      Hip external rotation      Knee flexion  3 3+ 4-  Knee extension  3- 3 4-  Ankle dorsiflexion      Ankle plantarflexion      Ankle inversion      Ankle eversion       Goal status: Progressing  4.  533ft ambulation with LRAD Baseline: 74ft with RW; 05/25/23 54ft + w/o AD Goal status: Met  5.  Increase FOTO score to 61 Baseline: 55 03/23/2023: 47% functional status Goal status: Progressing 06/01/23: 59%  6.  Increase reps to 6 on 30s chair stand test Baseline: 1 rep Goal status: MET 06/01/23: 9 reps   PLAN:  PT FREQUENCY:  1-2x/week  PT DURATION: 4 weeks  PLANNED INTERVENTIONS: Therapeutic exercises, Therapeutic activity, Neuromuscular re-education, Balance training, Gait training, Patient/Family education, Self Care, Joint mobilization, Stair training, DME instructions, Dry Needling, Electrical stimulation, Cryotherapy, Moist heat, scar mobilization, Manual therapy, and Re-evaluation  PLAN FOR NEXT SESSION: HEP review and update, manual techniques as appropriate, aerobic tasks, ROM and flexibility activities, strengthening and PREs, TPDN, gait and balance training as needed    Berta Minor PTA 06/03/23  9:52 AM Phone: [phone] Fax: [phone]

## 2023-06-07 NOTE — Therapy (Signed)
OUTPATIENT PHYSICAL THERAPY TREATMENT   Patient Name: Christine Barrett MRN: [id number] DOB:17-Aug-1960, 62 y.o., female Today's Date: 06/07/2023   END OF SESSION:     Past Medical History:  Diagnosis Date   Allergy    SEASONAL   Anemia    Arthritis    CAD S/P percutaneous coronary angioplasty 09/2013   a) Ostial AV G Cx - 2.5 mm Angiosculpt; mid LAD 40-60%; b) Myoview 07/2014: LOW RISK, small-severe fixed inferior defect c/w infarct w/o peri-infarct ischemia.   Chronic bronchitis (HCC)    "frequently; not q yr" (10/03/2013)   Diabetes mellitus without complication (HCC)    Type 2-diet controlled.    Diverticulosis    Essential hypertension    with prior Accelerated HTN   GERD (gastroesophageal reflux disease)    Hiatal hernia    Hx of non-ST elevation myocardial infarction (NSTEMI) 10/01/2013   Due to Accelerated HTN with existing CAD   Hyperlipemia    Migraine    "@ least once/month" (10/03/2013)   Myocardial infarction (HCC)    OSA (obstructive sleep apnea) 06/10/2016   Schatzki's ring    Seasonal allergies    Seizure (HCC)    1 in elementary school, never knew what caused it   Sinusitis    Spinal headache    during C-section of second child.    Past Surgical History:  Procedure Laterality Date   ABDOMINAL HYSTERECTOMY  1994   "partial"   CAPSULAR RELEASE Right 06/06/2020   Procedure: RIGHT SHOULDER MANIPULATION UNDER ANESTHESIA, ROTATOR CUFF REPAIR;  Surgeon: Cammy Copa, MD;  Location: [facility] OR;  Service: Orthopedics;  Laterality: Right;   CARDIAC CATHETERIZATION N/A 08/05/2015   Procedure: Left Heart Cath and Coronary Angiography;  Surgeon: Marykay Lex, MD;  Location: [facility] INVASIVE CV LAB;  Service: Cardiovascular;  Laterality: N/A;   CESAREAN SECTION  1989   COLONOSCOPY     CORONARY ANGIOPLASTY  10/03/2013   95% ostial AV G Cx - 2.5 mm AngioSculpt Balloon PTCA; mid LAD 40-60%   LEFT HEART CATHETERIZATION WITH CORONARY ANGIOGRAM N/A 10/02/2013   Procedure:  LEFT HEART CATHETERIZATION WITH CORONARY ANGIOGRAM;  Surgeon: Lennette Bihari, MD;  Location: [facility] CATH LAB;  Service: Cardiovascular;  Laterality: N/A;   NASAL SEPTOPLASTY W/ TURBINOPLASTY  ~ 2007   NM MYOVIEW LTD  08/22/2014    Low risk stress nuclear study with a small, severe, fixed defect in the distal inferior wall/apex suggestive of small prior infarct; no ischemia.  LV Wall Motion:  NL LV Function; NL Wall Motion   PERCUTANEOUS CORONARY STENT INTERVENTION (PCI-S) N/A 10/03/2013   cutting balloon angioplasty only no stent.    SINOSCOPY     TOTAL KNEE ARTHROPLASTY Left 02/02/2023   Procedure: LEFT TOTAL KNEE ARTHROPLASTY;  Surgeon: Cammy Copa, MD;  Location: [facility] OR;  Service: Orthopedics;  Laterality: Left;   TRANSTHORACIC ECHOCARDIOGRAM  10/02/2013   EF 55-60%; mild LVH, no RWMA,    TUBAL LIGATION  1989   Patient Active Problem List   Diagnosis Date Noted   OA (osteoarthritis) of knee 02/02/2023   Status post total knee replacement, left 02/02/2023   Coronary artery disease involving native heart with angina pectoris, unspecified vessel or lesion type (HCC) 10/29/2021   Trochanteric bursitis of left hip 04/08/2021   Other allergic rhinitis 01/01/2021   Frequent episodes of sinusitis 01/01/2021   History of bronchitis 01/01/2021   Allergic conjunctivitis of both eyes 01/01/2021   Family history of breast cancer in sister 09/20/2020  Type 2 diabetes mellitus with other circulatory complications, HTN, CVD (HCC) 05/17/2020   Morbid obesity (HCC) 05/17/2020   Class 2 severe obesity with serious comorbidity and body mass index (BMI) of 39.0 to 39.9 in adult (HCC) 05/17/2020   Acute pain of both knees 08/28/2016   OSA (obstructive sleep apnea) 06/10/2016   Costochondritis 06/09/2016   Bilateral leg cramps 02/07/2016   Hypertension associated with diabetes (HCC)    GERD (gastroesophageal reflux disease) 08/10/2014   Vaginal dryness, menopausal 08/10/2014   CAD S/P percutaneous  coronary angioplasty - Ostial AVG Cx - Scoring balloon PTCA 10/03/2013   Hyperlipidemia with target LDL less than 70 10/02/2013   H/O non-ST elevation myocardial infarction (NSTEMI) 10/02/2013   Normocytic anemia, not due to blood loss    Allergic sinusitis 03/17/2007   Migraine without aura 03/16/2007    PCP: Excell Seltzer, MD   REFERRING PROVIDER: Julieanne Cotton, PA-C  REFERRING DIAG: [phone] (ICD-10-CM) - Status post total knee replacement, left  THERAPY DIAG:  No diagnosis found.  Rationale for Evaluation and Treatment: Rehabilitation  ONSET DATE: 02/02/23 DOS   SUBJECTIVE:  SUBJECTIVE STATEMENT: *** Patient reports continued stiffness on lateral side of her Lt knee.   PERTINENT HISTORY: See PMH above  PAIN:  Are you having pain? Yes:  NPRS scale: 7/10 Pain location: Left knee Pain description: Sore, stiff Aggravating factors: ROM, activity Relieving factors: Rest, ice, heat  PRECAUTIONS: Knee  PATIENT GOALS: To regain the function in my L knee   OBJECTIVE:  PATIENT SURVEYS:   FOTO: 55 (61 predicted)   03/23/2023: 47% functional status 06/01/23: 59%   MUSCLE LENGTH: deferred  LOWER EXTREMITY ROM:  A/PROM Right eval Left eval Left 03/05/2023 Left 03/09/23 Left 03/18/23 Left 03/26/2023 Left  03/31/23 Left 04/15/23 L 05/25/23  Hip flexion           Hip extension           Hip abduction           Hip adduction           Hip internal rotation           Hip external rotation           Knee flexion  75/80d 85 AROM / 90 PROM 87 PROM 94 AAROM strap 97 AAROM 98 AAROM 100 AAROM 118d  Knee extension  -25/-10d -6 AROM -5 AROM -5 AROM Lacking 3     Ankle dorsiflexion           Ankle plantarflexion           Ankle inversion           Ankle eversion            (Blank rows = not tested)  LOWER EXTREMITY MMT:  MMT Right eval Left eval L 04/02/23 L 05/25/23  Hip flexion      Hip extension      Hip abduction      Hip adduction      Hip internal  rotation      Hip external rotation      Knee flexion  3 3+ 4-  Knee extension  3- 3 4-  Ankle dorsiflexion      Ankle plantarflexion      Ankle inversion      Ankle eversion       (Blank rows = not tested)  FUNCTIONAL TESTS:  30s chair stand 1 rep 06/01/23: 9 reps  GAIT: Distance walked: 68ft  x2 Assistive device utilized: Environmental consultant - 2 wheeled Level of assistance: Modified independence Comments: slow cadence with step through pattern   TODAY'S TREATMENT:  [facility] Adult PT Treatment:                                                DATE: 06/07/23 Therapeutic Exercise: Nustep L5 5 min Heel taps on 2" steps x10, x5 - cues for form Step ups step with 2 risers fwd/lat x10 ea Lt leading TKEs ball against wall - cues to decrease hip compensation x10 Heel raises over 4 in step 2x10 Slant board gastroc stretch 2x30" Stairs - step to and step over step pattern - difficulty on descent Modalities: Vasopneumatic (Game Ready)   Location:  left knee Time:  10 minutes Pressure:  low Temperature:  34 degrees   [facility] Adult PT Treatment:                                                DATE: 06/03/23 Therapeutic Exercise: Nustep L5 8 min Heel taps on 2" steps x10, x5 - cues for form Step ups step with 2 risers fwd/lat x10 ea Lt leading TKEs ball against wall - cues to decrease hip compensation x10 Heel raises over 4 in step 2x10 Slant board gastroc stretch 2x30" Stairs - step to and step over step pattern - difficulty on descent Modalities: Vasopneumatic (Game Ready)   Location:  left knee Time:  10 minutes Pressure:  low Temperature:  34 degrees    [facility] Adult PT Treatment:                                                DATE: 06/01/23 Therapeutic Exercise: Nustep L5 8 min Step ups 8" fwd/lat x10 ea BIL TKEs BluTB x15 Heel raises over 4 in step 2x10 Slant board gastroc stretch 2x30" Therapeutic Activity: 30" STS 9 reps FOTO 59%   PATIENT EDUCATION:  Education details: HEP Person  educated: Patient Education method: Explanation Education comprehension: verbalized understanding and needs further education  HOME EXERCISE PROGRAM: Access Code: [email or web address] URL: [email or web address] Date: 06/07/2023 Prepared by: Berta Minor  Exercises - Seated Heel Slide  - 5 x daily - 5 x weekly - 1 sets - 10 reps - Long Sitting Quad Set with Towel Roll Under Heel  - 5 x daily - 5 x weekly - 1 sets - 10 reps - 3s hold - Supine Heel Slide with Strap  - 5 x daily - 5 x weekly - 1 sets - 10 reps - Small Range Straight Leg Raise  - 5 x daily - 5 x weekly - 1 sets - 10 reps - Seated Hamstring Stretch  - 2-3 x daily - 3 reps - 20 seconds hold - Standing Gastroc Stretch at Counter  - 2-3 x daily - 3 reps - 20 seconds hold - Standing Knee Flexion Stretch on Step  - 2-3 x daily - 10 reps - 10 seconds hold - Standing Heel Raise with Support  - 1 x daily - 7 x weekly - 3 sets - 10 reps - Step  Up  - 1 x daily - 7 x weekly - 2 sets - 10 reps LLE LEADING - Forward Step Down  - 1 x daily - 7 x weekly - 2 sets - 10 reps LLE ON STEP   ASSESSMENT: CLINICAL IMPRESSION:  *** Updated HEP this session to incorporate more stair training as this continues to be difficult for patient, particularly with descent.   Patient presents to PT reporting continued pain on the lateral portion of her Lt knee, especially when she tries to sleep on the Lt side and when getting out of the car. Session today focused on quad strengthening with particular focus on stair training. She has difficulty with descent partly due to fear and weakness in the quads. Vaso utilized end of session for increased soreness. Patient was able to tolerate all prescribed exercises with no adverse effects. Patient continues to benefit from skilled PT services and should be progressed as able to improve functional independence.     OBJECTIVE IMPAIRMENTS: Abnormal gait, decreased activity tolerance, decreased coordination,  decreased endurance, decreased knowledge of condition, decreased knowledge of use of DME, decreased mobility, difficulty walking, decreased ROM, decreased strength, improper body mechanics, and pain.   ACTIVITY LIMITATIONS: carrying, lifting, standing, squatting, and stairs  PERSONAL FACTORS: Age, Fitness, Past/current experiences, and 1 comorbidity: DM  are also affecting patient's functional outcome.    GOALS: Goals reviewed with patient? No  SHORT TERM GOALS: Target date: 03/26/2023   Patient to demonstrate independence in HEP  Baseline: [email or web address] Goal status: Met  2.  Increase L knee AROM to 90d flexion and -10d extension Baseline:  A/PROM Right eval Left eval Left 03/05/2023 Left 03/09/23 Left 03/18/23 Left 03/26/2023 Left  03/31/23 Left 04/15/23 L 05/25/23  Hip flexion           Hip extension           Hip abduction           Hip adduction           Hip internal rotation           Hip external rotation           Knee flexion  75/80d 85 AROM / 90 PROM 87 PROM 94 AAROM strap 97 AAROM 98 AAROM 100 AAROM 118d  Knee extension  -25/-10d -6 AROM -5 AROM -5 AROM Lacking 3     Ankle dorsiflexion           Ankle plantarflexion           Ankle inversion           Ankle eversion            03/26/2023: see above Goal status: MET  3.  Decrease pain to 4/10 Baseline: 8/10; 4-5/10 pain vs soreness Goal status: Met  LONG TERM GOALS: Target date: 06/23/2023   Able to ascend 16 steps with LRAD and most appropriate pattern Baseline: 4 steps with B rails and step to pattern; 05/25/23 06/03/23: 4 steps with single rail and step over step pattern with mild pain Goal status: Progressing  2.  Increases L knee ROM to -5d extension and 120d flexion Baseline:  A/PROM Right eval Left eval  Hip flexion    Hip extension    Hip abduction    Hip adduction    Hip internal rotation    Hip external rotation    Knee flexion  75/80d  Knee extension  -25/-10d   Goal status:  Progressing  3.  Increase L knee strength to 4/5 Baseline:  MMT Right eval Left eval L 04/02/23 L 05/25/23  Hip flexion      Hip extension      Hip abduction      Hip adduction      Hip internal rotation      Hip external rotation      Knee flexion  3 3+ 4-  Knee extension  3- 3 4-  Ankle dorsiflexion      Ankle plantarflexion      Ankle inversion      Ankle eversion       Goal status: Progressing  4.  568ft ambulation with LRAD Baseline: 68ft with RW; 05/25/23 547ft + w/o AD Goal status: Met  5.  Increase FOTO score to 61 Baseline: 55 03/23/2023: 47% functional status Goal status: Progressing 06/01/23: 59%  6.  Increase reps to 6 on 30s chair stand test Baseline: 1 rep Goal status: MET 06/01/23: 9 reps   PLAN: PT FREQUENCY: 1-2x/week  PT DURATION: 4 weeks  PLANNED INTERVENTIONS: Therapeutic exercises, Therapeutic activity, Neuromuscular re-education, Balance training, Gait training, Patient/Family education, Self Care, Joint mobilization, Stair training, DME instructions, Dry Needling, Electrical stimulation, Cryotherapy, Moist heat, scar mobilization, Manual therapy, and Re-evaluation  PLAN FOR NEXT SESSION: HEP review and update, manual techniques as appropriate, aerobic tasks, ROM and flexibility activities, strengthening and PREs, TPDN, gait and balance training as needed    Berta Minor PTA 06/07/23  3:38 PM Phone: [phone] Fax: [phone]

## 2023-06-08 ENCOUNTER — Ambulatory Visit: Payer: Medicaid Other

## 2023-06-08 DIAGNOSIS — R2689 Other abnormalities of gait and mobility: Secondary | ICD-10-CM

## 2023-06-08 DIAGNOSIS — R600 Localized edema: Secondary | ICD-10-CM

## 2023-06-08 DIAGNOSIS — M6281 Muscle weakness (generalized): Secondary | ICD-10-CM | POA: Diagnosis not present

## 2023-06-08 DIAGNOSIS — M25562 Pain in left knee: Secondary | ICD-10-CM

## 2023-06-17 ENCOUNTER — Ambulatory Visit: Payer: Medicaid Other

## 2023-06-17 DIAGNOSIS — R2689 Other abnormalities of gait and mobility: Secondary | ICD-10-CM

## 2023-06-17 DIAGNOSIS — M6281 Muscle weakness (generalized): Secondary | ICD-10-CM

## 2023-06-17 DIAGNOSIS — M25562 Pain in left knee: Secondary | ICD-10-CM

## 2023-06-17 NOTE — Therapy (Signed)
OUTPATIENT PHYSICAL THERAPY TREATMENT   Patient Name: Christine Barrett MRN: [id number] DOB:1960/09/11, 62 y.o., female Today's Date: 06/17/2023   END OF SESSION:  PT End of Session - 06/17/23 0911     Visit Number 19    Number of Visits 24    Date for PT Re-Evaluation 07/25/23    Authorization Type [employer]    PT Start Time 0915    PT Stop Time 1003    PT Time Calculation (min) 48 min    Activity Tolerance Patient tolerated treatment well;Patient limited by pain    Behavior During Therapy [facility] for tasks assessed/performed              Past Medical History:  Diagnosis Date   Allergy    SEASONAL   Anemia    Arthritis    CAD S/P percutaneous coronary angioplasty 09/2013   a) Ostial AV G Cx - 2.5 mm Angiosculpt; mid LAD 40-60%; b) Myoview 07/2014: LOW RISK, small-severe fixed inferior defect c/w infarct w/o peri-infarct ischemia.   Chronic bronchitis (HCC)    "frequently; not q yr" (10/03/2013)   Diabetes mellitus without complication (HCC)    Type 2-diet controlled.    Diverticulosis    Essential hypertension    with prior Accelerated HTN   GERD (gastroesophageal reflux disease)    Hiatal hernia    Hx of non-ST elevation myocardial infarction (NSTEMI) 10/01/2013   Due to Accelerated HTN with existing CAD   Hyperlipemia    Migraine    "@ least once/month" (10/03/2013)   Myocardial infarction (HCC)    OSA (obstructive sleep apnea) 06/10/2016   Schatzki's ring    Seasonal allergies    Seizure (HCC)    1 in elementary school, never knew what caused it   Sinusitis    Spinal headache    during C-section of second child.    Past Surgical History:  Procedure Laterality Date   ABDOMINAL HYSTERECTOMY  1994   "partial"   CAPSULAR RELEASE Right 06/06/2020   Procedure: RIGHT SHOULDER MANIPULATION UNDER ANESTHESIA, ROTATOR CUFF REPAIR;  Surgeon: Cammy Copa, MD;  Location: [facility] OR;  Service: Orthopedics;  Laterality: Right;   CARDIAC CATHETERIZATION N/A 08/05/2015    Procedure: Left Heart Cath and Coronary Angiography;  Surgeon: Marykay Lex, MD;  Location: [facility] INVASIVE CV LAB;  Service: Cardiovascular;  Laterality: N/A;   CESAREAN SECTION  1989   COLONOSCOPY     CORONARY ANGIOPLASTY  10/03/2013   95% ostial AV G Cx - 2.5 mm AngioSculpt Balloon PTCA; mid LAD 40-60%   LEFT HEART CATHETERIZATION WITH CORONARY ANGIOGRAM N/A 10/02/2013   Procedure: LEFT HEART CATHETERIZATION WITH CORONARY ANGIOGRAM;  Surgeon: Lennette Bihari, MD;  Location: [facility] CATH LAB;  Service: Cardiovascular;  Laterality: N/A;   NASAL SEPTOPLASTY W/ TURBINOPLASTY  ~ 2007   NM MYOVIEW LTD  08/22/2014    Low risk stress nuclear study with a small, severe, fixed defect in the distal inferior wall/apex suggestive of small prior infarct; no ischemia.  LV Wall Motion:  NL LV Function; NL Wall Motion   PERCUTANEOUS CORONARY STENT INTERVENTION (PCI-S) N/A 10/03/2013   cutting balloon angioplasty only no stent.    SINOSCOPY     TOTAL KNEE ARTHROPLASTY Left 02/02/2023   Procedure: LEFT TOTAL KNEE ARTHROPLASTY;  Surgeon: Cammy Copa, MD;  Location: [facility] OR;  Service: Orthopedics;  Laterality: Left;   TRANSTHORACIC ECHOCARDIOGRAM  10/02/2013   EF 55-60%; mild LVH, no RWMA,    TUBAL LIGATION  1989  Patient Active Problem List   Diagnosis Date Noted   OA (osteoarthritis) of knee 02/02/2023   Status post total knee replacement, left 02/02/2023   Coronary artery disease involving native heart with angina pectoris, unspecified vessel or lesion type (HCC) 10/29/2021   Trochanteric bursitis of left hip 04/08/2021   Other allergic rhinitis 01/01/2021   Frequent episodes of sinusitis 01/01/2021   History of bronchitis 01/01/2021   Allergic conjunctivitis of both eyes 01/01/2021   Family history of breast cancer in sister 09/20/2020   Type 2 diabetes mellitus with other circulatory complications, HTN, CVD (HCC) 05/17/2020   Morbid obesity (HCC) 05/17/2020   Class 2 severe obesity with serious  comorbidity and body mass index (BMI) of 39.0 to 39.9 in adult (HCC) 05/17/2020   Acute pain of both knees 08/28/2016   OSA (obstructive sleep apnea) 06/10/2016   Costochondritis 06/09/2016   Bilateral leg cramps 02/07/2016   Hypertension associated with diabetes (HCC)    GERD (gastroesophageal reflux disease) 08/10/2014   Vaginal dryness, menopausal 08/10/2014   CAD S/P percutaneous coronary angioplasty - Ostial AVG Cx - Scoring balloon PTCA 10/03/2013   Hyperlipidemia with target LDL less than 70 10/02/2013   H/O non-ST elevation myocardial infarction (NSTEMI) 10/02/2013   Normocytic anemia, not due to blood loss    Allergic sinusitis 03/17/2007   Migraine without aura 03/16/2007    PCP: Excell Seltzer, MD   REFERRING PROVIDER: Julieanne Cotton, PA-C  REFERRING DIAG: [phone] (ICD-10-CM) - Status post total knee replacement, left  THERAPY DIAG:  Muscle weakness (generalized)  Other abnormalities of gait and mobility  Acute pain of left knee  Rationale for Evaluation and Treatment: Rehabilitation  ONSET DATE: 02/02/23 DOS   SUBJECTIVE:  SUBJECTIVE STATEMENT: Patient reports continued stiffness on lateral side of her Lt knee.   PERTINENT HISTORY: See PMH above  PAIN:  Are you having pain? Yes:  NPRS scale: 7/10 Pain location: Left knee Pain description: Sore, stiff Aggravating factors: ROM, activity Relieving factors: Rest, ice, heat  PRECAUTIONS: Knee  PATIENT GOALS: To regain the function in my L knee   OBJECTIVE:  PATIENT SURVEYS:   FOTO: 55 (61 predicted)   03/23/2023: 47% functional status 06/01/23: 59%   MUSCLE LENGTH: deferred  LOWER EXTREMITY ROM:  A/PROM Right eval Left eval Left 03/05/2023 Left 03/09/23 Left 03/18/23 Left 03/26/2023 Left  03/31/23 Left 04/15/23 L 05/25/23  Hip flexion           Hip extension           Hip abduction           Hip adduction           Hip internal rotation           Hip external rotation           Knee  flexion  75/80d 85 AROM / 90 PROM 87 PROM 94 AAROM strap 97 AAROM 98 AAROM 100 AAROM 118d  Knee extension  -25/-10d -6 AROM -5 AROM -5 AROM Lacking 3     Ankle dorsiflexion           Ankle plantarflexion           Ankle inversion           Ankle eversion            (Blank rows = not tested)  LOWER EXTREMITY MMT:  MMT Right eval Left eval L 04/02/23 L 05/25/23  Hip flexion  Hip extension      Hip abduction      Hip adduction      Hip internal rotation      Hip external rotation      Knee flexion  3 3+ 4-  Knee extension  3- 3 4-  Ankle dorsiflexion      Ankle plantarflexion      Ankle inversion      Ankle eversion       (Blank rows = not tested)  FUNCTIONAL TESTS:  30s chair stand 1 rep 06/01/23: 9 reps  GAIT: Distance walked: 64ft x2 Assistive device utilized: Environmental consultant - 2 wheeled Level of assistance: Modified independence Comments: slow cadence with step through pattern   TODAY'S TREATMENT:  [facility] Adult PT Treatment:                                                DATE: 06/17/23 Therapeutic Exercise: Nustep L5 5 min Heel taps on 2" steps 2x10 - cues for form Step ups 6" fwd/lat x10 ea Lt leading TKEs ball against wall - cues to decrease hip compensation 2x10 Heel raises over 6 in step 2x10 Slant board gastroc stretch 2x45" Stairs - step over step pattern on small  and big steps- difficulty on descent Modalities: Vasopneumatic (Game Ready)   Location:  left knee Time:  10 minutes Pressure:  low Temperature:  34 degrees   [facility] Adult PT Treatment:                                                DATE: 06/07/23 Therapeutic Exercise: Nustep L5 5 min Heel taps on 2" steps 2x10 - cues for form Step ups 6" fwd/lat x10 ea Lt leading TKEs ball against wall - cues to decrease hip compensation x10 Heel raises over 6 in step 2x10 Slant board gastroc stretch 2x45" Stairs - step over step pattern on small steps- difficulty on descent Modalities: Vasopneumatic (Game  Ready)   Location:  left knee Time:  10 minutes Pressure:  low Temperature:  34 degrees   [facility] Adult PT Treatment:                                                DATE: 06/03/23 Therapeutic Exercise: Nustep L5 8 min Heel taps on 2" steps x10, x5 - cues for form Step ups step with 2 risers fwd/lat x10 ea Lt leading TKEs ball against wall - cues to decrease hip compensation x10 Heel raises over 4 in step 2x10 Slant board gastroc stretch 2x30" Stairs - step to and step over step pattern - difficulty on descent Modalities: Vasopneumatic (Game Ready)   Location:  left knee Time:  10 minutes Pressure:  low Temperature:  34 degrees    PATIENT EDUCATION:  Education details: HEP Person educated: Patient Education method: Explanation Education comprehension: verbalized understanding and needs further education  HOME EXERCISE PROGRAM: Access Code: [email or web address] URL: [email or web address] Date: 06/07/2023 Prepared by: Berta Minor  Exercises - Seated Heel Slide  - 5 x daily - 5 x weekly - 1 sets - 10 reps - Long  Sitting Quad Set with Towel Roll Under Heel  - 5 x daily - 5 x weekly - 1 sets - 10 reps - 3s hold - Supine Heel Slide with Strap  - 5 x daily - 5 x weekly - 1 sets - 10 reps - Small Range Straight Leg Raise  - 5 x daily - 5 x weekly - 1 sets - 10 reps - Seated Hamstring Stretch  - 2-3 x daily - 3 reps - 20 seconds hold - Standing Gastroc Stretch at Counter  - 2-3 x daily - 3 reps - 20 seconds hold - Standing Knee Flexion Stretch on Step  - 2-3 x daily - 10 reps - 10 seconds hold - Standing Heel Raise with Support  - 1 x daily - 7 x weekly - 3 sets - 10 reps - Step Up  - 1 x daily - 7 x weekly - 2 sets - 10 reps LLE LEADING - Forward Step Down  - 1 x daily - 7 x weekly - 2 sets - 10 reps LLE ON STEP   ASSESSMENT: CLINICAL IMPRESSION:  Patient presents to PT reporting continued stiffness in her knee and points to posterior and lateral portions. Continued to  focus session today on quad strengthening and stair training. She displays improved descent with stairs, able to complete step over step pattern with no compensations. Patient was able to tolerate all prescribed exercises with no adverse effects. Patient continues to benefit from skilled PT services and should be progressed as able to improve functional independence.     OBJECTIVE IMPAIRMENTS: Abnormal gait, decreased activity tolerance, decreased coordination, decreased endurance, decreased knowledge of condition, decreased knowledge of use of DME, decreased mobility, difficulty walking, decreased ROM, decreased strength, improper body mechanics, and pain.   ACTIVITY LIMITATIONS: carrying, lifting, standing, squatting, and stairs  PERSONAL FACTORS: Age, Fitness, Past/current experiences, and 1 comorbidity: DM  are also affecting patient's functional outcome.    GOALS: Goals reviewed with patient? No  SHORT TERM GOALS: Target date: 03/26/2023   Patient to demonstrate independence in HEP  Baseline: [email or web address] Goal status: Met  2.  Increase L knee AROM to 90d flexion and -10d extension Baseline:  A/PROM Right eval Left eval Left 03/05/2023 Left 03/09/23 Left 03/18/23 Left 03/26/2023 Left  03/31/23 Left 04/15/23 L 05/25/23  Hip flexion           Hip extension           Hip abduction           Hip adduction           Hip internal rotation           Hip external rotation           Knee flexion  75/80d 85 AROM / 90 PROM 87 PROM 94 AAROM strap 97 AAROM 98 AAROM 100 AAROM 118d  Knee extension  -25/-10d -6 AROM -5 AROM -5 AROM Lacking 3     Ankle dorsiflexion           Ankle plantarflexion           Ankle inversion           Ankle eversion            03/26/2023: see above Goal status: MET  3.  Decrease pain to 4/10 Baseline: 8/10; 4-5/10 pain vs soreness Goal status: Met  LONG TERM GOALS: Target date: 06/23/2023   Able to ascend 16 steps with LRAD and most appropriate  pattern Baseline: 4 steps with B rails and step to pattern; 05/25/23 06/03/23: 4 steps with single rail and step over step pattern with mild pain Goal status: Progressing  2.  Increases L knee ROM to -5d extension and 120d flexion Baseline:  A/PROM Right eval Left eval  Hip flexion    Hip extension    Hip abduction    Hip adduction    Hip internal rotation    Hip external rotation    Knee flexion  75/80d  Knee extension  -25/-10d   Goal status: Progressing  3.  Increase L knee strength to 4/5 Baseline:  MMT Right eval Left eval L 04/02/23 L 05/25/23  Hip flexion      Hip extension      Hip abduction      Hip adduction      Hip internal rotation      Hip external rotation      Knee flexion  3 3+ 4-  Knee extension  3- 3 4-  Ankle dorsiflexion      Ankle plantarflexion      Ankle inversion      Ankle eversion       Goal status: Progressing  4.  521ft ambulation with LRAD Baseline: 23ft with RW; 05/25/23 585ft + w/o AD Goal status: Met  5.  Increase FOTO score to 61 Baseline: 55 03/23/2023: 47% functional status Goal status: Progressing 06/01/23: 59%  6.  Increase reps to 6 on 30s chair stand test Baseline: 1 rep Goal status: MET 06/01/23: 9 reps   PLAN: PT FREQUENCY: 1-2x/week  PT DURATION: 4 weeks  PLANNED INTERVENTIONS: Therapeutic exercises, Therapeutic activity, Neuromuscular re-education, Balance training, Gait training, Patient/Family education, Self Care, Joint mobilization, Stair training, DME instructions, Dry Needling, Electrical stimulation, Cryotherapy, Moist heat, scar mobilization, Manual therapy, and Re-evaluation  PLAN FOR NEXT SESSION: HEP review and update, manual techniques as appropriate, aerobic tasks, ROM and flexibility activities, strengthening and PREs, TPDN, gait and balance training as needed    Berta Minor PTA 06/17/23  9:54 AM Phone: [phone] Fax: [phone]

## 2023-06-18 NOTE — Therapy (Unsigned)
OUTPATIENT PHYSICAL THERAPY TREATMENT/20th VISIT PROGRESS NOTE   Patient Name: Christine Barrett MRN: [id number] DOB:Dec 21, 1960, 62 y.o., female Physical Therapy Progress Note   Dates of Reporting Period:8/24-12/24/24  See Note below for Objective Data and Assessment of Progress/Goals.   END OF SESSION:  PT End of Session - 06/22/23 0911     Visit Number 20    Number of Visits 24    Date for PT Re-Evaluation 07/25/23    Authorization Type [employer]    PT Start Time 0915    PT Stop Time 0955    PT Time Calculation (min) 40 min    Activity Tolerance Patient tolerated treatment well;Patient limited by pain    Behavior During Therapy [facility] for tasks assessed/performed               Past Medical History:  Diagnosis Date   Allergy    SEASONAL   Anemia    Arthritis    CAD S/P percutaneous coronary angioplasty 09/2013   a) Ostial AV G Cx - 2.5 mm Angiosculpt; mid LAD 40-60%; b) Myoview 07/2014: LOW RISK, small-severe fixed inferior defect c/w infarct w/o peri-infarct ischemia.   Chronic bronchitis (HCC)    "frequently; not q yr" (10/03/2013)   Diabetes mellitus without complication (HCC)    Type 2-diet controlled.    Diverticulosis    Essential hypertension    with prior Accelerated HTN   GERD (gastroesophageal reflux disease)    Hiatal hernia    Hx of non-ST elevation myocardial infarction (NSTEMI) 10/01/2013   Due to Accelerated HTN with existing CAD   Hyperlipemia    Migraine    "@ least once/month" (10/03/2013)   Myocardial infarction (HCC)    OSA (obstructive sleep apnea) 06/10/2016   Schatzki's ring    Seasonal allergies    Seizure (HCC)    1 in elementary school, never knew what caused it   Sinusitis    Spinal headache    during C-section of second child.    Past Surgical History:  Procedure Laterality Date   ABDOMINAL HYSTERECTOMY  1994   "partial"   CAPSULAR RELEASE Right 06/06/2020   Procedure: RIGHT SHOULDER MANIPULATION UNDER ANESTHESIA, ROTATOR CUFF  REPAIR;  Surgeon: Cammy Copa, MD;  Location: [facility] OR;  Service: Orthopedics;  Laterality: Right;   CARDIAC CATHETERIZATION N/A 08/05/2015   Procedure: Left Heart Cath and Coronary Angiography;  Surgeon: Marykay Lex, MD;  Location: [facility] INVASIVE CV LAB;  Service: Cardiovascular;  Laterality: N/A;   CESAREAN SECTION  1989   COLONOSCOPY     CORONARY ANGIOPLASTY  10/03/2013   95% ostial AV G Cx - 2.5 mm AngioSculpt Balloon PTCA; mid LAD 40-60%   LEFT HEART CATHETERIZATION WITH CORONARY ANGIOGRAM N/A 10/02/2013   Procedure: LEFT HEART CATHETERIZATION WITH CORONARY ANGIOGRAM;  Surgeon: Lennette Bihari, MD;  Location: [facility] CATH LAB;  Service: Cardiovascular;  Laterality: N/A;   NASAL SEPTOPLASTY W/ TURBINOPLASTY  ~ 2007   NM MYOVIEW LTD  08/22/2014    Low risk stress nuclear study with a small, severe, fixed defect in the distal inferior wall/apex suggestive of small prior infarct; no ischemia.  LV Wall Motion:  NL LV Function; NL Wall Motion   PERCUTANEOUS CORONARY STENT INTERVENTION (PCI-S) N/A 10/03/2013   cutting balloon angioplasty only no stent.    SINOSCOPY     TOTAL KNEE ARTHROPLASTY Left 02/02/2023   Procedure: LEFT TOTAL KNEE ARTHROPLASTY;  Surgeon: Cammy Copa, MD;  Location: [facility] OR;  Service: Orthopedics;  Laterality:  Left;   TRANSTHORACIC ECHOCARDIOGRAM  10/02/2013   EF 55-60%; mild LVH, no RWMA,    TUBAL LIGATION  1989   Patient Active Problem List   Diagnosis Date Noted   OA (osteoarthritis) of knee 02/02/2023   Status post total knee replacement, left 02/02/2023   Coronary artery disease involving native heart with angina pectoris, unspecified vessel or lesion type (HCC) 10/29/2021   Trochanteric bursitis of left hip 04/08/2021   Other allergic rhinitis 01/01/2021   Frequent episodes of sinusitis 01/01/2021   History of bronchitis 01/01/2021   Allergic conjunctivitis of both eyes 01/01/2021   Family history of breast cancer in sister 09/20/2020   Type 2 diabetes  mellitus with other circulatory complications, HTN, CVD (HCC) 05/17/2020   Morbid obesity (HCC) 05/17/2020   Class 2 severe obesity with serious comorbidity and body mass index (BMI) of 39.0 to 39.9 in adult (HCC) 05/17/2020   Acute pain of both knees 08/28/2016   OSA (obstructive sleep apnea) 06/10/2016   Costochondritis 06/09/2016   Bilateral leg cramps 02/07/2016   Hypertension associated with diabetes (HCC)    GERD (gastroesophageal reflux disease) 08/10/2014   Vaginal dryness, menopausal 08/10/2014   CAD S/P percutaneous coronary angioplasty - Ostial AVG Cx - Scoring balloon PTCA 10/03/2013   Hyperlipidemia with target LDL less than 70 10/02/2013   H/O non-ST elevation myocardial infarction (NSTEMI) 10/02/2013   Normocytic anemia, not due to blood loss    Allergic sinusitis 03/17/2007   Migraine without aura 03/16/2007    PCP: Excell Seltzer, MD   REFERRING PROVIDER: Julieanne Cotton, PA-C  REFERRING DIAG: [phone] (ICD-10-CM) - Status post total knee replacement, left  THERAPY DIAG:  Muscle weakness (generalized)  Other abnormalities of gait and mobility  Rationale for Evaluation and Treatment: Rehabilitation  ONSET DATE: 02/02/23 DOS   SUBJECTIVE:  SUBJECTIVE STATEMENT: Symptoms localized to lateral PF region. Does not limit her tasks.  PERTINENT HISTORY: See PMH above  PAIN:  Are you having pain? Yes:  NPRS scale: 7/10 Pain location: Left knee Pain description: Sore, stiff Aggravating factors: ROM, activity Relieving factors: Rest, ice, heat  PRECAUTIONS: Knee  PATIENT GOALS: To regain the function in my L knee   OBJECTIVE:  PATIENT SURVEYS:   FOTO: 55 (61 predicted)   03/23/2023: 47% functional status 06/01/23: 59%   MUSCLE LENGTH: deferred  LOWER EXTREMITY ROM:  A/PROM Right eval Left eval Left 03/05/2023 Left 03/09/23 Left 03/18/23 Left 03/26/2023 Left  03/31/23 Left 04/15/23 L 05/25/23  Hip flexion           Hip extension           Hip  abduction           Hip adduction           Hip internal rotation           Hip external rotation           Knee flexion  75/80d 85 AROM / 90 PROM 87 PROM 94 AAROM strap 97 AAROM 98 AAROM 100 AAROM 118d  Knee extension  -25/-10d -6 AROM -5 AROM -5 AROM Lacking 3     Ankle dorsiflexion           Ankle plantarflexion           Ankle inversion           Ankle eversion            (Blank rows = not tested)  LOWER EXTREMITY MMT:  MMT Right eval Left eval L 04/02/23 L 05/25/23  Hip flexion      Hip extension      Hip abduction      Hip adduction      Hip internal rotation      Hip external rotation      Knee flexion  3 3+ 4-  Knee extension  3- 3 4-  Ankle dorsiflexion      Ankle plantarflexion      Ankle inversion      Ankle eversion       (Blank rows = not tested)  FUNCTIONAL TESTS:  30s chair stand 1 rep 06/01/23: 9 reps  GAIT: Distance walked: 68ft x2 Assistive device utilized: Environmental consultant - 2 wheeled Level of assistance: Modified independence Comments: slow cadence with step through pattern   TODAY'S TREATMENT:  [facility] Adult PT Treatment:                                                DATE: 06/22/23 Therapeutic Exercise: B QS 3 s hold 15x SAQ 5# 2x15 SLR 2# 15x FAQ with adduction 4 in lateral step downs 15x TKE BluTB 15x  [facility] Adult PT Treatment:                                                DATE: 06/17/23 Therapeutic Exercise: Nustep L5 5 min Heel taps on 2" steps 2x10 - cues for form Step ups 6" fwd/lat x10 ea Lt leading TKEs ball against wall - cues to decrease hip compensation 2x10 Heel raises over 6 in step 2x10 Slant board gastroc stretch 2x45" Stairs - step over step pattern on small  and big steps- difficulty on descent Modalities: Vasopneumatic (Game Ready)   Location:  left knee Time:  10 minutes Pressure:  low Temperature:  34 degrees   [facility] Adult PT Treatment:                                                DATE: 06/07/23 Therapeutic  Exercise: Nustep L5 5 min Heel taps on 2" steps 2x10 - cues for form Step ups 6" fwd/lat x10 ea Lt leading TKEs ball against wall - cues to decrease hip compensation x10 Heel raises over 6 in step 2x10 Slant board gastroc stretch 2x45" Stairs - step over step pattern on small steps- difficulty on descent Modalities: Vasopneumatic (Game Ready)   Location:  left knee Time:  10 minutes Pressure:  low Temperature:  34 degrees   [facility] Adult PT Treatment:                                                DATE: 06/03/23 Therapeutic Exercise: Nustep L5 8 min Heel taps on 2" steps x10, x5 - cues for form Step ups step with 2 risers fwd/lat x10 ea Lt leading TKEs ball against wall - cues to decrease hip compensation x10 Heel raises over 4 in step 2x10 Slant board gastroc  stretch 2x30" Stairs - step to and step over step pattern - difficulty on descent Modalities: Vasopneumatic (Game Ready)   Location:  left knee Time:  10 minutes Pressure:  low Temperature:  34 degrees    PATIENT EDUCATION:  Education details: HEP Person educated: Patient Education method: Explanation Education comprehension: verbalized understanding and needs further education  HOME EXERCISE PROGRAM: Access Code: [email or web address] URL: [email or web address] Date: 06/22/2023 Prepared by: Gustavus Bryant  Exercises - Small Range Straight Leg Raise  - 2 x daily - 5 x weekly - 2 sets - 15 reps - Supine Knee Extension Strengthening  - 2 x daily - 5 x weekly - 2 sets - 15 reps - 2s hold - Supine Short Arc Quad  - 2 x daily - 5 x weekly - 2 sets - 15 reps - 2s hold - Standing Terminal Knee Extension with Resistance  - 2 x daily - 5 x weekly - 2 sets - 15 reps   ASSESSMENT: CLINICAL IMPRESSION: Treatment focus was weaning from modalities and focusing on TKE.  Patient unable to generate a functional QS and VMO activation markedly insufficient.  Improved motor patterning noted following exercise session.  R abductor  weakness identified as well and addressed with HEP.  Patient presents to PT reporting continued stiffness in her knee and points to posterior and lateral portions. Continued to focus session today on quad strengthening and stair training. She displays improved descent with stairs, able to complete step over step pattern with no compensations. Patient was able to tolerate all prescribed exercises with no adverse effects. Patient continues to benefit from skilled PT services and should be progressed as able to improve functional independence.     OBJECTIVE IMPAIRMENTS: Abnormal gait, decreased activity tolerance, decreased coordination, decreased endurance, decreased knowledge of condition, decreased knowledge of use of DME, decreased mobility, difficulty walking, decreased ROM, decreased strength, improper body mechanics, and pain.   ACTIVITY LIMITATIONS: carrying, lifting, standing, squatting, and stairs  PERSONAL FACTORS: Age, Fitness, Past/current experiences, and 1 comorbidity: DM  are also affecting patient's functional outcome.    GOALS: Goals reviewed with patient? No  SHORT TERM GOALS: Target date: 03/26/2023   Patient to demonstrate independence in HEP  Baseline: [email or web address] Goal status: Met  2.  Increase L knee AROM to 90d flexion and -10d extension Baseline:  A/PROM Right eval Left eval Left 03/05/2023 Left 03/09/23 Left 03/18/23 Left 03/26/2023 Left  03/31/23 Left 04/15/23 L 05/25/23  Hip flexion           Hip extension           Hip abduction           Hip adduction           Hip internal rotation           Hip external rotation           Knee flexion  75/80d 85 AROM / 90 PROM 87 PROM 94 AAROM strap 97 AAROM 98 AAROM 100 AAROM 118d  Knee extension  -25/-10d -6 AROM -5 AROM -5 AROM Lacking 3     Ankle dorsiflexion           Ankle plantarflexion           Ankle inversion           Ankle eversion            03/26/2023: see above Goal status: MET  3.  Decrease pain to  4/10 Baseline: 8/10; 4-5/10 pain vs  soreness Goal status: Met  LONG TERM GOALS: Target date: 06/23/2023   Able to ascend 16 steps with LRAD and most appropriate pattern Baseline: 4 steps with B rails and step to pattern; 05/25/23 06/03/23: 4 steps with single rail and step over step pattern with mild pain Goal status: Progressing  2.  Increases L knee ROM to -5d extension and 120d flexion Baseline:  A/PROM Right eval Left eval  Hip flexion    Hip extension    Hip abduction    Hip adduction    Hip internal rotation    Hip external rotation    Knee flexion  75/80d  Knee extension  -25/-10d  06/03/23 118d flexion Goal status: Progressing  3.  Increase L knee strength to 4/5 Baseline:  MMT Right eval Left eval L 04/02/23 L 05/25/23  Hip flexion      Hip extension      Hip abduction      Hip adduction      Hip internal rotation      Hip external rotation      Knee flexion  3 3+ 4-  Knee extension  3- 3 4-  Ankle dorsiflexion      Ankle plantarflexion      Ankle inversion      Ankle eversion       Goal status: Progressing  4.  567ft ambulation with LRAD Baseline: 40ft with RW; 05/25/23 524ft + w/o AD Goal status: Met  5.  Increase FOTO score to 61 Baseline: 55 03/23/2023: 47% functional status Goal status: Progressing 06/01/23: 59%  6.  Increase reps to 6 on 30s chair stand test Baseline: 1 rep Goal status: MET 06/01/23: 9 reps   PLAN: PT FREQUENCY: 1-2x/week  PT DURATION: 4 weeks  PLANNED INTERVENTIONS: Therapeutic exercises, Therapeutic activity, Neuromuscular re-education, Balance training, Gait training, Patient/Family education, Self Care, Joint mobilization, Stair training, DME instructions, Dry Needling, Electrical stimulation, Cryotherapy, Moist heat, scar mobilization, Manual therapy, and Re-evaluation  PLAN FOR NEXT SESSION: HEP review and update, manual techniques as appropriate, aerobic tasks, ROM and flexibility activities, strengthening and  PREs, TPDN, gait and balance training as needed    Hildred Laser PT 06/22/23  10:22 AM Phone: [phone] Fax: [phone]

## 2023-06-22 ENCOUNTER — Ambulatory Visit: Payer: Medicaid Other

## 2023-06-22 ENCOUNTER — Telehealth: Payer: Self-pay | Admitting: *Deleted

## 2023-06-22 DIAGNOSIS — M6281 Muscle weakness (generalized): Secondary | ICD-10-CM

## 2023-06-22 DIAGNOSIS — R2689 Other abnormalities of gait and mobility: Secondary | ICD-10-CM

## 2023-06-22 NOTE — Telephone Encounter (Signed)
Copied from CRM [id number]. Topic: Clinical - Medication Question >> Jun 22, 2023 11:55 AM Christine Barrett wrote: Reason for CRM: Patient states she's experiencing dizziness as Barrett side effect from the [facility], she isn't sure if its due to the changed dosage.

## 2023-06-22 NOTE — Telephone Encounter (Signed)
I spoke with pt; pt said for 2 wks having lightheadedness on and off; pt has been taking mounjaro 2.5 mg for about 1 year and last month increased to 5 mg. Pt also having some nausea,diarrhea, and belching from the mounjaro. Pt taking Losartan 100 mg daily, Imdur 60 mg taking 2 tabs daily and carvedilol 25 mg bid. Pt has not missed meds. Pt was at [facility] on 06/21/23 and BP 110/60; pt does not have BP cuff at home. Pt does not feel the need to go to UC today. Pt scheduled appt with Dr Alphonsus Sias on 06/24/23 at 9 AM. Dr Ermalene Searing not back in office until 06/29/23.UC & ED precautions given and pt voiced understanding.,sending note to Dr Epimenio Foot pool and Lorain Childes to Dr Ermalene Searing as PCP.

## 2023-06-22 NOTE — Telephone Encounter (Signed)
Please triage

## 2023-06-22 NOTE — Telephone Encounter (Signed)
 Okay---I will assess her at the OV

## 2023-06-24 ENCOUNTER — Ambulatory Visit: Payer: Medicaid Other | Admitting: Internal Medicine

## 2023-06-24 ENCOUNTER — Encounter: Payer: Self-pay | Admitting: Internal Medicine

## 2023-06-24 VITALS — BP 124/84 | HR 78 | Temp 98.2°F | Ht 68.5 in | Wt 262.0 lb

## 2023-06-24 DIAGNOSIS — E1159 Type 2 diabetes mellitus with other circulatory complications: Secondary | ICD-10-CM | POA: Diagnosis not present

## 2023-06-24 DIAGNOSIS — R42 Dizziness and giddiness: Secondary | ICD-10-CM | POA: Insufficient documentation

## 2023-06-24 DIAGNOSIS — Z7985 Long-term (current) use of injectable non-insulin antidiabetic drugs: Secondary | ICD-10-CM | POA: Diagnosis not present

## 2023-06-24 DIAGNOSIS — I25119 Atherosclerotic heart disease of native coronary artery with unspecified angina pectoris: Secondary | ICD-10-CM | POA: Diagnosis not present

## 2023-06-24 MED ORDER — ISOSORBIDE MONONITRATE ER 60 MG PO TB24: 60.0000 mg | ORAL_TABLET | Freq: Every day | ORAL | 0 refills | Status: DC

## 2023-06-24 NOTE — Assessment & Plan Note (Signed)
No clear angina--so will try to reduce the isosorbide in hopes that it clears the dizziness

## 2023-06-24 NOTE — Progress Notes (Signed)
Subjective:    Patient ID: Christine Barrett, female    DOB: [birth year], 62 y.o.   MRN: [id number]  HPI Here due to dizziness  Was trying to deal with it---but is concerned Wondered if it was related to increased mounjaro (5mg  weekly) But no clear low sugar reactions---does have as low as 100 (and will shake some after)  Started about 2 weeks ago Comes and goes Feeling across her upper head---lightheaded feeling Will lose balance if she stands up after bending over Not in bed---but sometimes when sitting or driving No real vestibular component No pre syncopal sensation  No chest pain--but gets sensation from apparent costochondritis No SOB --other than stable DOE with walking since COVID No palpitations  Current Outpatient Medications on File Prior to Visit  Medication Sig Dispense Refill   acetaminophen (TYLENOL) 325 MG tablet Take 1-2 tablets (325-650 mg total) by mouth every 6 (six) hours as needed for mild pain (pain score 1-3 or temp > 100.5). 60 tablet 0   albuterol (VENTOLIN HFA) 108 (90 Base) MCG/ACT inhaler INHALE 2 PUFFS BY MOUTH EVERY 6 HOURS AS NEEDED FOR WHEEZING OR  SHORTNESS  OF  BREATH 18 g 0   ASPIRIN LOW DOSE 81 MG chewable tablet CHEW 1 TABLET (81 MG TOTAL) BY MOUTH 2 (TWO) TIMES DAILY. TO PREVENT BLOOD CLOTS 180 tablet 1   BLACK ELDERBERRY PO Take 2 each by mouth daily.     Blood Glucose Monitoring Suppl (ONETOUCH VERIO) w/Device KIT Use to check blood sugar 2 times a day 1 kit 0   carvedilol (COREG) 25 MG tablet TAKE 1 TABLET BY MOUTH TWICE DAILY WITH A MEAL 180 tablet 1   Cholecalciferol (VITAMIN D3 PO) Take 1 tablet by mouth daily.     ezetimibe (ZETIA) 10 MG tablet Take 1 tablet (10 mg total) by mouth daily. 90 tablet 3   famotidine (PEPCID) 20 MG tablet Take 1 tablet (20 mg total) by mouth daily. 90 tablet 3   fluticasone (FLONASE) 50 MCG/ACT nasal spray Place 2 sprays into both nostrils daily as needed for allergies or rhinitis.     glucose blood (ONETOUCH  VERIO) test strip Use to check blood sugar 2 times a day 200 strip 3   hydrochlorothiazide (HYDRODIURIL) 25 MG tablet TAKE 1 TABLET DAILY 90 tablet 3   isosorbide mononitrate (IMDUR) 60 MG 24 hr tablet Take 2 tablets (120 mg total) by mouth daily. 180 tablet 0   Lancet Devices (ONE TOUCH DELICA LANCING DEV) MISC Use to check blood sugar 2 times a day 1 each 0   Lancets (ONETOUCH DELICA PLUS LANCET30G) MISC USE TO CHECK BLOOD SUGAR 2 TIMES A DAY 200 each 3   latanoprost (XALATAN) 0.005 % ophthalmic solution Place 1 drop into both eyes at bedtime.      levocetirizine (XYZAL) 5 MG tablet Take 5 mg by mouth daily.     losartan (COZAAR) 100 MG tablet Take 1 tablet (100 mg total) by mouth daily. 90 tablet 3   montelukast (SINGULAIR) 10 MG tablet Take 1 tablet (10 mg total) by mouth at bedtime. 90 tablet 3   Multiple Vitamin (MULTIVITAMIN WITH MINERALS) TABS tablet Take 1 tablet by mouth daily.     pantoprazole (PROTONIX) 40 MG tablet Take 1 tablet (40 mg total) by mouth daily. 90 tablet 0   timolol (TIMOPTIC) 0.5 % ophthalmic solution Place 1 drop into both eyes every morning.     tirzepatide [facility]) 5 MG/0.5ML Pen Inject 5 mg into the  skin once a week. 2 mL 11   amLODipine (NORVASC) 5 MG tablet Take 1 tablet (5 mg total) by mouth daily. (Patient not taking: Reported on 06/24/2023) 180 tablet 1   Multiple Vitamins-Minerals (EMERGEN-C IMMUNE) PACK Take 1 packet by mouth daily as needed (during cold season). (Patient not taking: Reported on 06/24/2023)     rosuvastatin (CRESTOR) 20 MG tablet Take 1 tablet (20 mg total) by mouth daily. 90 tablet 3   zinc gluconate 50 MG tablet Take 50 mg by mouth daily.     No current facility-administered medications on file prior to visit.    Allergies  Allergen Reactions   Lactose Intolerance (Gi)    Nickel Rash    Nickel jewlery causes bumps and rash on skin   Penicillins Rash    Pt states she has taken since intial  reaction without recurrence.  Has  patient had a PCN reaction causing immediate rash, facial/tongue/throat swelling, SOB or lightheadedness with hypotension:  no Has patient had a PCN reaction causing severe rash involving mucus membranes or skin necrosis: no Has patient had a PCN reaction that required hospitalization no Has patient had a PCN reaction occurring within the last 10 years: no If all of the above answers are "NO", then may proceed with Cephalospori    Past Medical History:  Diagnosis Date   Allergy    SEASONAL   Anemia    Arthritis    CAD S/P percutaneous coronary angioplasty 09/2013   a) Ostial AV G Cx - 2.5 mm Angiosculpt; mid LAD 40-60%; b) Myoview 07/2014: LOW RISK, small-severe fixed inferior defect c/w infarct w/o peri-infarct ischemia.   Chronic bronchitis (HCC)    "frequently; not q yr" (10/03/2013)   Diabetes mellitus without complication (HCC)    Type 2-diet controlled.    Diverticulosis    Essential hypertension    with prior Accelerated HTN   GERD (gastroesophageal reflux disease)    Hiatal hernia    Hx of non-ST elevation myocardial infarction (NSTEMI) 10/01/2013   Due to Accelerated HTN with existing CAD   Hyperlipemia    Migraine    "@ least once/month" (10/03/2013)   Myocardial infarction (HCC)    OSA (obstructive sleep apnea) 06/10/2016   Schatzki's ring    Seasonal allergies    Seizure (HCC)    1 in elementary school, never knew what caused it   Sinusitis    Spinal headache    during C-section of second child.     Past Surgical History:  Procedure Laterality Date   ABDOMINAL HYSTERECTOMY  1994   "partial"   CAPSULAR RELEASE Right 06/06/2020   Procedure: RIGHT SHOULDER MANIPULATION UNDER ANESTHESIA, ROTATOR CUFF REPAIR;  Surgeon: Cammy Copa, MD;  Location: [facility] OR;  Service: Orthopedics;  Laterality: Right;   CARDIAC CATHETERIZATION N/A 08/05/2015   Procedure: Left Heart Cath and Coronary Angiography;  Surgeon: Marykay Lex, MD;  Location: [facility] INVASIVE CV LAB;  Service:  Cardiovascular;  Laterality: N/A;   CESAREAN SECTION  1989   COLONOSCOPY     CORONARY ANGIOPLASTY  10/03/2013   95% ostial AV G Cx - 2.5 mm AngioSculpt Balloon PTCA; mid LAD 40-60%   LEFT HEART CATHETERIZATION WITH CORONARY ANGIOGRAM N/A 10/02/2013   Procedure: LEFT HEART CATHETERIZATION WITH CORONARY ANGIOGRAM;  Surgeon: Lennette Bihari, MD;  Location: [facility] CATH LAB;  Service: Cardiovascular;  Laterality: N/A;   NASAL SEPTOPLASTY W/ TURBINOPLASTY  ~ 2007   NM MYOVIEW LTD  08/22/2014    Low risk  stress nuclear study with a small, severe, fixed defect in the distal inferior wall/apex suggestive of small prior infarct; no ischemia.  LV Wall Motion:  NL LV Function; NL Wall Motion   PERCUTANEOUS CORONARY STENT INTERVENTION (PCI-S) N/A 10/03/2013   cutting balloon angioplasty only no stent.    SINOSCOPY     TOTAL KNEE ARTHROPLASTY Left 02/02/2023   Procedure: LEFT TOTAL KNEE ARTHROPLASTY;  Surgeon: Cammy Copa, MD;  Location: [facility] OR;  Service: Orthopedics;  Laterality: Left;   TRANSTHORACIC ECHOCARDIOGRAM  10/02/2013   EF 55-60%; mild LVH, no RWMA,    TUBAL LIGATION  1989    Family History  Adopted: Yes  Problem Relation Age of Onset   Allergic rhinitis Mother    Diabetes Mother    Sinusitis Mother    Breast cancer Sister    Hypertension Sister    Colon cancer Neg Hx    Heart attack Neg Hx    Stroke Neg Hx     Social History   Socioeconomic History   Marital status: Divorced    Spouse name: Molly Maduro   Number of children: 2   Years of education: Not on file   Highest education level: Not on file  Occupational History   Occupation: Biomedical engineer  Tobacco Use   Smoking status: Never    Passive exposure: Never   Smokeless tobacco: Never  Vaping Use   Vaping status: Never Used  Substance and Sexual Activity   Alcohol use: Not Currently    Comment: occ   Drug use: No   Sexual activity: Yes    Birth control/protection: Surgical  Other Topics Concern   Not on file   Social History Narrative   She is a married mother of 2, grandmother 2. Her current marriage is than a pack years.    She work as an Systems analyst -  For [employer]   She has completed 3 years of college. She never smoked and does not drink All. She does do time and balancing exercises but does not do routine of exercise.   She herself is adopted.   Social Drivers of Corporate investment banker Strain: Not on file  Food Insecurity: No Food Insecurity (02/15/2023)   Hunger Vital Sign    Worried About Running Out of Food in the Last Year: Never true    Ran Out of Food in the Last Year: Never true  Transportation Needs: No Transportation Needs (02/15/2023)   PRAPARE - Administrator, Civil Service (Medical): No    Lack of Transportation (Non-Medical): No  Physical Activity: Not on file  Stress: Not on file  Social Connections: Not on file  Intimate Partner Violence: Not on file   Review of Systems Usually only eats once a day---due to [facility] Has lost a lot of weight --did have one BP medication stopped (amlodipine)    Objective:   Physical Exam Constitutional:      Appearance: Normal appearance.  Cardiovascular:     Rate and Rhythm: Normal rate and regular rhythm.     Heart sounds: No murmur heard.    No gallop.  Pulmonary:     Effort: Pulmonary effort is normal.     Breath sounds: Normal breath sounds. No wheezing or rales.  Musculoskeletal:     Cervical back: Neck supple.     Right lower leg: No edema.     Left lower leg: No edema.  Lymphadenopathy:     Cervical: No  cervical adenopathy.  Neurological:     Mental Status: She is alert.            Assessment & Plan:

## 2023-06-24 NOTE — Assessment & Plan Note (Signed)
Lab Results  Component Value Date   HGBA1C 6.5 05/17/2023   Control is good Hopefully can tolerate the 5mg  moujaro

## 2023-06-24 NOTE — Assessment & Plan Note (Signed)
Not vestibular and doesn't seem to be neurologic No clear hypoglycemia that would cause this--and only on the mounjaro Likely med related Now off amlodipine --BP still fairly good Will cut the isosorbide to 60mg  daily (from 120) to see if that helps She will monitor chest and breathing status

## 2023-06-28 NOTE — Telephone Encounter (Signed)
Thank you for seeing her

## 2023-07-01 ENCOUNTER — Ambulatory Visit: Payer: Medicaid Other | Attending: Surgical

## 2023-07-01 DIAGNOSIS — M25562 Pain in left knee: Secondary | ICD-10-CM | POA: Diagnosis present

## 2023-07-01 DIAGNOSIS — M6281 Muscle weakness (generalized): Secondary | ICD-10-CM | POA: Diagnosis present

## 2023-07-01 DIAGNOSIS — R2689 Other abnormalities of gait and mobility: Secondary | ICD-10-CM | POA: Insufficient documentation

## 2023-07-01 NOTE — Therapy (Signed)
 OUTPATIENT PHYSICAL THERAPY TREATMENT   Patient Name: Christine Barrett MRN: [id number] DOB:1961/02/22, 63 y.o., female   END OF SESSION:  PT End of Session - 07/01/23 1132     Visit Number 21    Number of Visits 24    Authorization Type [employer]    PT Start Time 1132    PT Stop Time 1212    PT Time Calculation (min) 40 min    Activity Tolerance Patient tolerated treatment well;Patient limited by pain    Behavior During Therapy [facility] for tasks assessed/performed             Past Medical History:  Diagnosis Date   Allergy    SEASONAL   Anemia    Arthritis    CAD S/P percutaneous coronary angioplasty 09/2013   a) Ostial AV G Cx - 2.5 mm Angiosculpt; mid LAD 40-60%; b) Myoview  07/2014: LOW RISK, small-severe fixed inferior defect c/w infarct w/o peri-infarct ischemia.   Chronic bronchitis (HCC)    frequently; not q yr (10/03/2013)   Diabetes mellitus without complication (HCC)    Type 2-diet controlled.    Diverticulosis    Essential hypertension    with prior Accelerated HTN   GERD (gastroesophageal reflux disease)    Hiatal hernia    Hx of non-ST elevation myocardial infarction (NSTEMI) 10/01/2013   Due to Accelerated HTN with existing CAD   Hyperlipemia    Migraine    @ least once/month (10/03/2013)   Myocardial infarction (HCC)    OSA (obstructive sleep apnea) 06/10/2016   Schatzki's ring    Seasonal allergies    Seizure (HCC)    1 in elementary school, never knew what caused it   Sinusitis    Spinal headache    during C-section of second child.    Past Surgical History:  Procedure Laterality Date   ABDOMINAL HYSTERECTOMY  1994   partial   CAPSULAR RELEASE Right 06/06/2020   Procedure: RIGHT SHOULDER MANIPULATION UNDER ANESTHESIA, ROTATOR CUFF REPAIR;  Surgeon: Addie Cordella Hamilton, MD;  Location: [facility] OR;  Service: Orthopedics;  Laterality: Right;   CARDIAC CATHETERIZATION N/A 08/05/2015   Procedure: Left Heart Cath and Coronary Angiography;  Surgeon: Alm LELON Clay, MD;  Location: [facility] INVASIVE CV LAB;  Service: Cardiovascular;  Laterality: N/A;   CESAREAN SECTION  1989   COLONOSCOPY     CORONARY ANGIOPLASTY  10/03/2013   95% ostial AV G Cx - 2.5 mm AngioSculpt Balloon PTCA; mid LAD 40-60%   LEFT HEART CATHETERIZATION WITH CORONARY ANGIOGRAM N/A 10/02/2013   Procedure: LEFT HEART CATHETERIZATION WITH CORONARY ANGIOGRAM;  Surgeon: Debby DELENA Sor, MD;  Location: [facility] CATH LAB;  Service: Cardiovascular;  Laterality: N/A;   NASAL SEPTOPLASTY W/ TURBINOPLASTY  ~ 2007   NM MYOVIEW  LTD  08/22/2014    Low risk stress nuclear study with a small, severe, fixed defect in the distal inferior wall/apex suggestive of small prior infarct; no ischemia.  LV Wall Motion:  NL LV Function; NL Wall Motion   PERCUTANEOUS CORONARY STENT INTERVENTION (PCI-S) N/A 10/03/2013   cutting balloon angioplasty only no stent.    SINOSCOPY     TOTAL KNEE ARTHROPLASTY Left 02/02/2023   Procedure: LEFT TOTAL KNEE ARTHROPLASTY;  Surgeon: Addie Cordella Hamilton, MD;  Location: [facility] OR;  Service: Orthopedics;  Laterality: Left;   TRANSTHORACIC ECHOCARDIOGRAM  10/02/2013   EF 55-60%; mild LVH, no RWMA,    TUBAL LIGATION  1989   Patient Active Problem List   Diagnosis Date Noted  Dizziness 06/24/2023   OA (osteoarthritis) of knee 02/02/2023   Status post total knee replacement, left 02/02/2023   Coronary artery disease involving native heart with angina pectoris, unspecified vessel or lesion type (HCC) 10/29/2021   Trochanteric bursitis of left hip 04/08/2021   Other allergic rhinitis 01/01/2021   Frequent episodes of sinusitis 01/01/2021   History of bronchitis 01/01/2021   Allergic conjunctivitis of both eyes 01/01/2021   Family history of breast cancer in sister 09/20/2020   Type 2 diabetes mellitus with other circulatory complications, HTN, CVD (HCC) 05/17/2020   Morbid obesity (HCC) 05/17/2020   Class 2 severe obesity with serious comorbidity and body mass index (BMI) of 39.0 to  39.9 in adult (HCC) 05/17/2020   Acute pain of both knees 08/28/2016   OSA (obstructive sleep apnea) 06/10/2016   Costochondritis 06/09/2016   Bilateral leg cramps 02/07/2016   Hypertension associated with diabetes (HCC)    GERD (gastroesophageal reflux disease) 08/10/2014   Vaginal dryness, menopausal 08/10/2014   CAD S/P percutaneous coronary angioplasty - Ostial AVG Cx - Scoring balloon PTCA 10/03/2013   Hyperlipidemia with target LDL less than 70 10/02/2013   H/O non-ST elevation myocardial infarction (NSTEMI) 10/02/2013   Normocytic anemia, not due to blood loss    Allergic sinusitis 03/17/2007   Migraine without aura 03/16/2007    PCP: Avelina Greig BRAVO, MD   REFERRING PROVIDER: Shirly Carlin CROME, PA-C  REFERRING DIAG: [phone] (ICD-10-CM) - Status post total knee replacement, left  THERAPY DIAG:  Muscle weakness (generalized)  Acute pain of left knee  Other abnormalities of gait and mobility  Rationale for Evaluation and Treatment: Rehabilitation  ONSET DATE: 02/02/23 DOS   SUBJECTIVE:  SUBJECTIVE STATEMENT:  Patient reports continued pain in Lt knee. She has been dealing with some dizziness lately and her MD has adjusted some of her BP medications. She has noticed a small improvement so far.  PERTINENT HISTORY: See PMH above  PAIN:  Are you having pain? Yes:  NPRS scale: 7/10 Pain location: Left knee Pain description: Sore, stiff Aggravating factors: ROM, activity Relieving factors: Rest, ice, heat  PRECAUTIONS: Knee  PATIENT GOALS: To regain the function in my L knee   OBJECTIVE:  PATIENT SURVEYS:   FOTO: 55 (61 predicted)   03/23/2023: 47% functional status 06/01/23: 59%   MUSCLE LENGTH: deferred  LOWER EXTREMITY ROM:  A/PROM Right eval Left eval Left 03/05/2023 Left 03/09/23 Left 03/18/23 Left 03/26/2023 Left  03/31/23 Left 04/15/23 L 05/25/23  Hip flexion           Hip extension           Hip abduction           Hip adduction           Hip  internal rotation           Hip external rotation           Knee flexion  75/80d 85 AROM / 90 PROM 87 PROM 94 AAROM strap 97 AAROM 98 AAROM 100 AAROM 118d  Knee extension  -25/-10d -6 AROM -5 AROM -5 AROM Lacking 3     Ankle dorsiflexion           Ankle plantarflexion           Ankle inversion           Ankle eversion            (Blank rows = not tested)  LOWER EXTREMITY MMT:  MMT Right  eval Left eval L 04/02/23 L 05/25/23  Hip flexion      Hip extension      Hip abduction      Hip adduction      Hip internal rotation      Hip external rotation      Knee flexion  3 3+ 4-  Knee extension  3- 3 4-  Ankle dorsiflexion      Ankle plantarflexion      Ankle inversion      Ankle eversion       (Blank rows = not tested)  FUNCTIONAL TESTS:  30s chair stand 1 rep 06/01/23: 9 reps  GAIT: Distance walked: 86ft x2 Assistive device utilized: Environmental Consultant - 2 wheeled Level of assistance: Modified independence Comments: slow cadence with step through pattern   TODAY'S TREATMENT:  [facility] Adult PT Treatment:                                                DATE: 07/01/23 Therapeutic Exercise: Nustep L5 5 min Heel taps on 2 steps 2x10 - cues for form Step ups 6 fwd/lat 2x10 ea Lt leading TKEs BlueTB x15 B QS 3 s hold 15x SAQ 5# 2x15 SLR 2# 10x FAQ with adduction x15   [facility] Adult PT Treatment:                                                DATE: 06/22/23 Therapeutic Exercise: B QS 3 s hold 15x SAQ 5# 2x15 SLR 2# 15x FAQ with adduction 4 in lateral step downs 15x TKE BluTB 15x  [facility] Adult PT Treatment:                                                DATE: 06/17/23 Therapeutic Exercise: Nustep L5 5 min Heel taps on 2 steps 2x10 - cues for form Step ups 6 fwd/lat x10 ea Lt leading TKEs ball against wall - cues to decrease hip compensation 2x10 Heel raises over 6 in step 2x10 Slant board gastroc stretch 2x45 Stairs - step over step pattern on small  and big steps- difficulty on  descent Modalities: Vasopneumatic (Game Ready)   Location:  left knee Time:  10 minutes Pressure:  low Temperature:  34 degrees    PATIENT EDUCATION:  Education details: HEP Person educated: Patient Education method: Explanation Education comprehension: verbalized understanding and needs further education  HOME EXERCISE PROGRAM: Access Code: [email or web address] URL: [email or web address] Date: 06/22/2023 Prepared by: Reyes Kohut  Exercises - Small Range Straight Leg Raise  - 2 x daily - 5 x weekly - 2 sets - 15 reps - Supine Knee Extension Strengthening  - 2 x daily - 5 x weekly - 2 sets - 15 reps - 2s hold - Supine Short Arc Quad  - 2 x daily - 5 x weekly - 2 sets - 15 reps - 2s hold - Standing Terminal Knee Extension with Resistance  - 2 x daily - 5 x weekly - 2 sets - 15 reps   ASSESSMENT: CLINICAL IMPRESSION:  Patient presents to PT reporting some pain in  her Lt knee today. She still has pain on the lateral portion of her knee with quad activation activities. Session today continued to focus on quad activation and strengthening. Will plan to DC at next session. Patient continues to benefit from skilled PT services and should be progressed as able to improve functional independence.      OBJECTIVE IMPAIRMENTS: Abnormal gait, decreased activity tolerance, decreased coordination, decreased endurance, decreased knowledge of condition, decreased knowledge of use of DME, decreased mobility, difficulty walking, decreased ROM, decreased strength, improper body mechanics, and pain.   ACTIVITY LIMITATIONS: carrying, lifting, standing, squatting, and stairs  PERSONAL FACTORS: Age, Fitness, Past/current experiences, and 1 comorbidity: DM  are also affecting patient's functional outcome.    GOALS: Goals reviewed with patient? No  SHORT TERM GOALS: Target date: 03/26/2023   Patient to demonstrate independence in HEP  Baseline: [email or web address] Goal status: Met  2.  Increase L  knee AROM to 90d flexion and -10d extension Baseline:  A/PROM Right eval Left eval Left 03/05/2023 Left 03/09/23 Left 03/18/23 Left 03/26/2023 Left  03/31/23 Left 04/15/23 L 05/25/23  Hip flexion           Hip extension           Hip abduction           Hip adduction           Hip internal rotation           Hip external rotation           Knee flexion  75/80d 85 AROM / 90 PROM 87 PROM 94 AAROM strap 97 AAROM 98 AAROM 100 AAROM 118d  Knee extension  -25/-10d -6 AROM -5 AROM -5 AROM Lacking 3     Ankle dorsiflexion           Ankle plantarflexion           Ankle inversion           Ankle eversion            03/26/2023: see above Goal status: MET  3.  Decrease pain to 4/10 Baseline: 8/10; 4-5/10 pain vs soreness Goal status: Met  LONG TERM GOALS: Target date: 06/23/2023   Able to ascend 16 steps with LRAD and most appropriate pattern Baseline: 4 steps with B rails and step to pattern; 05/25/23 06/03/23: 4 steps with single rail and step over step pattern with mild pain Goal status: Progressing  2.  Increases L knee ROM to -5d extension and 120d flexion Baseline:  A/PROM Right eval Left eval  Hip flexion    Hip extension    Hip abduction    Hip adduction    Hip internal rotation    Hip external rotation    Knee flexion  75/80d  Knee extension  -25/-10d  06/03/23 118d flexion Goal status: Progressing  3.  Increase L knee strength to 4/5 Baseline:  MMT Right eval Left eval L 04/02/23 L 05/25/23  Hip flexion      Hip extension      Hip abduction      Hip adduction      Hip internal rotation      Hip external rotation      Knee flexion  3 3+ 4-  Knee extension  3- 3 4-  Ankle dorsiflexion      Ankle plantarflexion      Ankle inversion      Ankle eversion       Goal status: Progressing  4.  546ft ambulation with LRAD Baseline: 86ft with RW; 05/25/23 547ft + w/o AD Goal status: Met  5.  Increase FOTO score to 61 Baseline: 55 03/23/2023: 47% functional  status Goal status: Progressing 06/01/23: 59%  6.  Increase reps to 6 on 30s chair stand test Baseline: 1 rep Goal status: MET 06/01/23: 9 reps   PLAN: PT FREQUENCY: 1-2x/week  PT DURATION: 4 weeks  PLANNED INTERVENTIONS: Therapeutic exercises, Therapeutic activity, Neuromuscular re-education, Balance training, Gait training, Patient/Family education, Self Care, Joint mobilization, Stair training, DME instructions, Dry Needling, Electrical stimulation, Cryotherapy, Moist heat, scar mobilization, Manual therapy, and Re-evaluation  PLAN FOR NEXT SESSION: HEP review and update, manual techniques as appropriate, aerobic tasks, ROM and flexibility activities, strengthening and PREs, TPDN, gait and balance training as needed    Corean Pouch PTA 07/01/23  12:13 PM Phone: [phone] Fax: [phone]

## 2023-07-08 ENCOUNTER — Ambulatory Visit: Payer: Medicaid Other

## 2023-07-08 DIAGNOSIS — R2689 Other abnormalities of gait and mobility: Secondary | ICD-10-CM

## 2023-07-08 DIAGNOSIS — M6281 Muscle weakness (generalized): Secondary | ICD-10-CM | POA: Diagnosis not present

## 2023-07-08 DIAGNOSIS — M25562 Pain in left knee: Secondary | ICD-10-CM

## 2023-07-08 NOTE — Therapy (Addendum)
 OUTPATIENT PHYSICAL THERAPY TREATMENT/DISCHARGE   Patient Name: Christine Barrett MRN: [id number] DOB:07/09/1960, 63 y.o., female  PHYSICAL THERAPY DISCHARGE SUMMARY  Visits from Start of Care: 22  Current functional level related to goals / functional outcomes: Goals met   Remaining deficits: Weakness   Education / Equipment: HEP   Patient agrees to discharge. Patient goals were partially met. Patient is being discharged due to being pleased with the current functional level.  END OF SESSION:  PT End of Session - 07/08/23 0908     Visit Number 22    Number of Visits 24    Date for PT Re-Evaluation 07/25/23    Authorization Type [employer]    PT Start Time 0915    PT Stop Time 0955    PT Time Calculation (min) 40 min    Activity Tolerance Patient tolerated treatment well;Patient limited by pain    Behavior During Therapy [facility] for tasks assessed/performed            Past Medical History:  Diagnosis Date   Allergy    SEASONAL   Anemia    Arthritis    CAD S/P percutaneous coronary angioplasty 09/2013   a) Ostial AV G Cx - 2.5 mm Angiosculpt; mid LAD 40-60%; b) Myoview  07/2014: LOW RISK, small-severe fixed inferior defect c/w infarct w/o peri-infarct ischemia.   Chronic bronchitis (HCC)    frequently; not q yr (10/03/2013)   Diabetes mellitus without complication (HCC)    Type 2-diet controlled.    Diverticulosis    Essential hypertension    with prior Accelerated HTN   GERD (gastroesophageal reflux disease)    Hiatal hernia    Hx of non-ST elevation myocardial infarction (NSTEMI) 10/01/2013   Due to Accelerated HTN with existing CAD   Hyperlipemia    Migraine    @ least once/month (10/03/2013)   Myocardial infarction (HCC)    OSA (obstructive sleep apnea) 06/10/2016   Schatzki's ring    Seasonal allergies    Seizure (HCC)    1 in elementary school, never knew what caused it   Sinusitis    Spinal headache    during C-section of second child.    Past Surgical  History:  Procedure Laterality Date   ABDOMINAL HYSTERECTOMY  1994   partial   CAPSULAR RELEASE Right 06/06/2020   Procedure: RIGHT SHOULDER MANIPULATION UNDER ANESTHESIA, ROTATOR CUFF REPAIR;  Surgeon: Addie Cordella Hamilton, MD;  Location: [facility] OR;  Service: Orthopedics;  Laterality: Right;   CARDIAC CATHETERIZATION N/A 08/05/2015   Procedure: Left Heart Cath and Coronary Angiography;  Surgeon: Alm LELON Clay, MD;  Location: [facility] INVASIVE CV LAB;  Service: Cardiovascular;  Laterality: N/A;   CESAREAN SECTION  1989   COLONOSCOPY     CORONARY ANGIOPLASTY  10/03/2013   95% ostial AV G Cx - 2.5 mm AngioSculpt Balloon PTCA; mid LAD 40-60%   LEFT HEART CATHETERIZATION WITH CORONARY ANGIOGRAM N/A 10/02/2013   Procedure: LEFT HEART CATHETERIZATION WITH CORONARY ANGIOGRAM;  Surgeon: Debby DELENA Sor, MD;  Location: [facility] CATH LAB;  Service: Cardiovascular;  Laterality: N/A;   NASAL SEPTOPLASTY W/ TURBINOPLASTY  ~ 2007   NM MYOVIEW  LTD  08/22/2014    Low risk stress nuclear study with a small, severe, fixed defect in the distal inferior wall/apex suggestive of small prior infarct; no ischemia.  LV Wall Motion:  NL LV Function; NL Wall Motion   PERCUTANEOUS CORONARY STENT INTERVENTION (PCI-S) N/A 10/03/2013   cutting balloon angioplasty only no stent.    SINOSCOPY  TOTAL KNEE ARTHROPLASTY Left 02/02/2023   Procedure: LEFT TOTAL KNEE ARTHROPLASTY;  Surgeon: Addie Cordella Hamilton, MD;  Location: [facility] OR;  Service: Orthopedics;  Laterality: Left;   TRANSTHORACIC ECHOCARDIOGRAM  10/02/2013   EF 55-60%; mild LVH, no RWMA,    TUBAL LIGATION  1989   Patient Active Problem List   Diagnosis Date Noted   Dizziness 06/24/2023   OA (osteoarthritis) of knee 02/02/2023   Status post total knee replacement, left 02/02/2023   Coronary artery disease involving native heart with angina pectoris, unspecified vessel or lesion type (HCC) 10/29/2021   Trochanteric bursitis of left hip 04/08/2021   Other allergic rhinitis  01/01/2021   Frequent episodes of sinusitis 01/01/2021   History of bronchitis 01/01/2021   Allergic conjunctivitis of both eyes 01/01/2021   Family history of breast cancer in sister 09/20/2020   Type 2 diabetes mellitus with other circulatory complications, HTN, CVD (HCC) 05/17/2020   Morbid obesity (HCC) 05/17/2020   Class 2 severe obesity with serious comorbidity and body mass index (BMI) of 39.0 to 39.9 in adult (HCC) 05/17/2020   Acute pain of both knees 08/28/2016   OSA (obstructive sleep apnea) 06/10/2016   Costochondritis 06/09/2016   Bilateral leg cramps 02/07/2016   Hypertension associated with diabetes (HCC)    GERD (gastroesophageal reflux disease) 08/10/2014   Vaginal dryness, menopausal 08/10/2014   CAD S/P percutaneous coronary angioplasty - Ostial AVG Cx - Scoring balloon PTCA 10/03/2013   Hyperlipidemia with target LDL less than 70 10/02/2013   H/O non-ST elevation myocardial infarction (NSTEMI) 10/02/2013   Normocytic anemia, not due to blood loss    Allergic sinusitis 03/17/2007   Migraine without aura 03/16/2007    PCP: Avelina Greig BRAVO, MD   REFERRING PROVIDER: Shirly Carlin CROME, PA-C  REFERRING DIAG: [phone] (ICD-10-CM) - Status post total knee replacement, left  THERAPY DIAG:  Muscle weakness (generalized)  Acute pain of left knee  Other abnormalities of gait and mobility  Rationale for Evaluation and Treatment: Rehabilitation  ONSET DATE: 02/02/23 DOS   SUBJECTIVE:  SUBJECTIVE STATEMENT:  Patient reports continued lateral knee pain.   PERTINENT HISTORY: See PMH above  PAIN:  Are you having pain? Yes:  NPRS scale: 7/10 Pain location: Left knee Pain description: Sore, stiff Aggravating factors: ROM, activity Relieving factors: Rest, ice, heat  PRECAUTIONS: Knee  PATIENT GOALS: To regain the function in my L knee   OBJECTIVE:  PATIENT SURVEYS:   FOTO: 55 (61 predicted)   03/23/2023: 47% functional status 06/01/23: 59%  07/08/23:  59%  MUSCLE LENGTH: deferred  LOWER EXTREMITY ROM:  A/PROM Right eval Left eval Left 03/05/2023 Left 03/09/23 Left 03/18/23 Left 03/26/2023 Left  03/31/23 Left 04/15/23 L 05/25/23 L 07/08/23  Hip flexion            Hip extension            Hip abduction            Hip adduction            Hip internal rotation            Hip external rotation            Knee flexion  75/80d 85 AROM / 90 PROM 87 PROM 94 AAROM strap 97 AAROM 98 AAROM 100 AAROM 118d 101d  Knee extension  -25/-10d -6 AROM -5 AROM -5 AROM Lacking 3    Lacking 2d  Ankle dorsiflexion  Ankle plantarflexion            Ankle inversion            Ankle eversion             (Blank rows = not tested)  LOWER EXTREMITY MMT:  MMT Right eval Left eval L 04/02/23 L 05/25/23 L 07/08/23  Hip flexion       Hip extension       Hip abduction       Hip adduction       Hip internal rotation       Hip external rotation       Knee flexion  3 3+ 4- 4+  Knee extension  3- 3 4- 4p!  Ankle dorsiflexion       Ankle plantarflexion       Ankle inversion       Ankle eversion        (Blank rows = not tested)  FUNCTIONAL TESTS:  30s chair stand 1 rep 06/01/23: 9 reps  GAIT: Distance walked: 46ft x2 Assistive device utilized: Environmental Consultant - 2 wheeled Level of assistance: Modified independence Comments: slow cadence with step through pattern   TODAY'S TREATMENT:  [facility] Adult PT Treatment:                                                DATE: 07/08/23 Therapeutic Exercise: Nustep L5 5 min Omega knee extension 5x for demo Omega knee flexion 5x for demo Therapeutic Activity:  Discussion of goals, readministration of FOTO Discussion of HEP and return to gym   [facility] Adult PT Treatment:                                                DATE: 07/01/23 Therapeutic Exercise: Nustep L5 5 min Heel taps on 2 steps 2x10 - cues for form Step ups 6 fwd/lat 2x10 ea Lt leading TKEs BlueTB x15 B QS 3 s hold 15x SAQ 5# 2x15 SLR 2# 10x FAQ  with adduction x15   [facility] Adult PT Treatment:                                                DATE: 06/22/23 Therapeutic Exercise: B QS 3 s hold 15x SAQ 5# 2x15 SLR 2# 15x FAQ with adduction 4 in lateral step downs 15x TKE BluTB 15x    PATIENT EDUCATION:  Education details: HEP Person educated: Patient Education method: Explanation Education comprehension: verbalized understanding and needs further education  HOME EXERCISE PROGRAM: Access Code: [email or web address] URL: [email or web address] Date: 06/22/2023 Prepared by: Jeffrey Ziemba  Exercises - Small Range Straight Leg Raise  - 2 x daily - 5 x weekly - 2 sets - 15 reps - Supine Knee Extension Strengthening  - 2 x daily - 5 x weekly - 2 sets - 15 reps - 2s hold - Supine Short Arc Quad  - 2 x daily - 5 x weekly - 2 sets - 15 reps - 2s hold - Standing Terminal Knee Extension with Resistance  - 2 x daily - 5 x weekly - 2  sets - 15 reps   ASSESSMENT: CLINICAL IMPRESSION:  Patient presents to PT reporting some continued knee pain on the lateral portion of her knee. Patient has progressed in her ROM and MMT since beginning therapy and has reached a plateau at this point of physical therapy. She continues to endorse lateral knee pain particularly with quadriceps contraction. She intends to return to surgeon to obtain additional imaging if necessary. Patient is appropriate for DC from PT at this.   OBJECTIVE IMPAIRMENTS: Abnormal gait, decreased activity tolerance, decreased coordination, decreased endurance, decreased knowledge of condition, decreased knowledge of use of DME, decreased mobility, difficulty walking, decreased ROM, decreased strength, improper body mechanics, and pain.   ACTIVITY LIMITATIONS: carrying, lifting, standing, squatting, and stairs  PERSONAL FACTORS: Age, Fitness, Past/current experiences, and 1 comorbidity: DM  are also affecting patient's functional outcome.    GOALS: Goals reviewed with patient?  No  SHORT TERM GOALS: Target date: 03/26/2023   Patient to demonstrate independence in HEP  Baseline: [email or web address] Goal status: Met  2.  Increase L knee AROM to 90d flexion and -10d extension Baseline:  A/PROM Right eval Left eval Left 03/05/2023 Left 03/09/23 Left 03/18/23 Left 03/26/2023 Left  03/31/23 Left 04/15/23 L 05/25/23  Hip flexion           Hip extension           Hip abduction           Hip adduction           Hip internal rotation           Hip external rotation           Knee flexion  75/80d 85 AROM / 90 PROM 87 PROM 94 AAROM strap 97 AAROM 98 AAROM 100 AAROM 118d  Knee extension  -25/-10d -6 AROM -5 AROM -5 AROM Lacking 3     Ankle dorsiflexion           Ankle plantarflexion           Ankle inversion           Ankle eversion            03/26/2023: see above Goal status: MET  3.  Decrease pain to 4/10 Baseline: 8/10; 4-5/10 pain vs soreness Goal status: Met  LONG TERM GOALS: Target date: 06/23/2023   Able to ascend 16 steps with LRAD and most appropriate pattern Baseline: 4 steps with B rails and step to pattern; 05/25/23 06/03/23: 4 steps with single rail and step over step pattern with mild pain Goal status: MET 07/08/23: No AD, step over step pattern   2.  Increases L knee ROM to -5d extension and 120d flexion Baseline:  A/PROM Right eval Left eval  Hip flexion    Hip extension    Hip abduction    Hip adduction    Hip internal rotation    Hip external rotation    Knee flexion  75/80d  Knee extension  -25/-10d  06/03/23 118d flexion Goal status: Partially met 07/08/23: See above chart  3.  Increase L knee strength to 4/5 Baseline:  MMT Right eval Left eval L 04/02/23 L 05/25/23  Hip flexion      Hip extension      Hip abduction      Hip adduction      Hip internal rotation      Hip external rotation      Knee flexion  3 3+ 4-  Knee extension  3-  3 4-  Ankle dorsiflexion      Ankle plantarflexion      Ankle inversion      Ankle eversion        Goal status: MET 07/08/23: See above chart  4.  539ft ambulation with LRAD Baseline: 98ft with RW; 05/25/23 526ft + w/o AD Goal status: Met  5.  Increase FOTO score to 61 Baseline: 55 03/23/2023: 47% functional status Goal status: Partially met 06/01/23: 59% 07/08/23: 59%  6.  Increase reps to 6 on 30s chair stand test Baseline: 1 rep Goal status: MET 06/01/23: 9 reps   PLAN: PT FREQUENCY: 1-2x/week  PT DURATION: 4 weeks  PLANNED INTERVENTIONS: Therapeutic exercises, Therapeutic activity, Neuromuscular re-education, Balance training, Gait training, Patient/Family education, Self Care, Joint mobilization, Stair training, DME instructions, Dry Needling, Electrical stimulation, Cryotherapy, Moist heat, scar mobilization, Manual therapy, and Re-evaluation  PLAN FOR NEXT SESSION: HEP review and update, manual techniques as appropriate, aerobic tasks, ROM and flexibility activities, strengthening and PREs, TPDN, gait and balance training as needed    Corean Pouch PTA 07/08/23  9:59 AM Phone: [phone] Fax: [phone]

## 2023-07-21 ENCOUNTER — Encounter (HOSPITAL_BASED_OUTPATIENT_CLINIC_OR_DEPARTMENT_OTHER): Payer: Self-pay

## 2023-07-21 ENCOUNTER — Ambulatory Visit: Payer: Medicaid Other | Admitting: Orthopedic Surgery

## 2023-07-21 ENCOUNTER — Other Ambulatory Visit: Payer: Self-pay | Admitting: Family Medicine

## 2023-07-22 ENCOUNTER — Ambulatory Visit (HOSPITAL_BASED_OUTPATIENT_CLINIC_OR_DEPARTMENT_OTHER): Payer: Medicaid Other | Admitting: Cardiology

## 2023-08-04 ENCOUNTER — Other Ambulatory Visit: Payer: Self-pay | Admitting: Family Medicine

## 2023-08-04 DIAGNOSIS — R1310 Dysphagia, unspecified: Secondary | ICD-10-CM

## 2023-09-14 LAB — HM DIABETES EYE EXAM

## 2023-09-16 ENCOUNTER — Encounter: Payer: Self-pay | Admitting: Family Medicine

## 2023-09-27 ENCOUNTER — Encounter: Payer: Self-pay | Admitting: Orthopedic Surgery

## 2023-09-28 ENCOUNTER — Other Ambulatory Visit: Payer: Self-pay | Admitting: Family Medicine

## 2023-09-28 DIAGNOSIS — I10 Essential (primary) hypertension: Secondary | ICD-10-CM

## 2023-09-28 NOTE — Telephone Encounter (Signed)
 Okay for both thanks

## 2023-09-28 NOTE — Telephone Encounter (Signed)
Patient picked up from front desk.

## 2023-10-07 ENCOUNTER — Encounter: Payer: Self-pay | Admitting: Family Medicine

## 2023-10-07 LAB — HM MAMMOGRAPHY

## 2023-10-08 ENCOUNTER — Encounter: Payer: Self-pay | Admitting: Family Medicine

## 2023-10-26 ENCOUNTER — Other Ambulatory Visit: Payer: Self-pay | Admitting: Family Medicine

## 2023-10-26 MED ORDER — TIRZEPATIDE 2.5 MG/0.5ML ~~LOC~~ SOAJ: 2.5000 mg | SUBCUTANEOUS | 11 refills | Status: DC

## 2023-11-02 ENCOUNTER — Telehealth: Payer: Self-pay | Admitting: *Deleted

## 2023-11-02 DIAGNOSIS — E1159 Type 2 diabetes mellitus with other circulatory complications: Secondary | ICD-10-CM

## 2023-11-02 DIAGNOSIS — E785 Hyperlipidemia, unspecified: Secondary | ICD-10-CM

## 2023-11-02 NOTE — Telephone Encounter (Signed)
-----   Message from Gerry Krone sent at 11/02/2023  2:57 PM EDT ----- Regarding: Lab orders for Tue, 5.20.25 Patient is scheduled for CPX labs, please order future labs, Thanks , Anselmo Kings

## 2023-11-09 ENCOUNTER — Ambulatory Visit (INDEPENDENT_AMBULATORY_CARE_PROVIDER_SITE_OTHER): Admitting: Internal Medicine

## 2023-11-09 ENCOUNTER — Encounter: Payer: Self-pay | Admitting: Internal Medicine

## 2023-11-09 ENCOUNTER — Ambulatory Visit: Payer: Self-pay | Admitting: *Deleted

## 2023-11-09 VITALS — BP 176/94 | HR 95 | Temp 99.4°F | Ht 68.5 in | Wt 263.0 lb

## 2023-11-09 DIAGNOSIS — I152 Hypertension secondary to endocrine disorders: Secondary | ICD-10-CM

## 2023-11-09 DIAGNOSIS — J0140 Acute pansinusitis, unspecified: Secondary | ICD-10-CM

## 2023-11-09 DIAGNOSIS — E1159 Type 2 diabetes mellitus with other circulatory complications: Secondary | ICD-10-CM | POA: Diagnosis not present

## 2023-11-09 DIAGNOSIS — R0602 Shortness of breath: Secondary | ICD-10-CM

## 2023-11-09 LAB — POC COVID19 BINAXNOW: SARS Coronavirus 2 Ag: NEGATIVE

## 2023-11-09 MED ORDER — DOXYCYCLINE HYCLATE 100 MG PO TABS: 100.0000 mg | ORAL_TABLET | Freq: Two times a day (BID) | ORAL | 0 refills | Status: DC

## 2023-11-09 NOTE — Assessment & Plan Note (Signed)
 BP Readings from Last 3 Encounters:  11/09/23 (!) 176/94  06/24/23 124/84  05/21/23 90/60   Told her to stop the decongestant

## 2023-11-09 NOTE — Progress Notes (Signed)
 Subjective:    Patient ID: Christine Barrett, female    DOB: [birth year], 62 y.o.   MRN: [id number]  HPI Here due to respiratory illness  Started 3 days ago--with sinus drainage Gets it every year Vitamin C, zinc, elderberry Pollen is bad--xyzal and singulair  regularly Eyes watery and itchy Bad post nasal drip Woke with nausea from PND and some nausea Some cough today No clear fever--but felt warm this morning Felt cold/hot---no shakes or sweats Some SOB  Did try sudafed congestion around the clock--may help some Saline spray on nose---is able to get yellow stuff out  Current Outpatient Medications on File Prior to Visit  Medication Sig Dispense Refill   acetaminophen  (TYLENOL ) 325 MG tablet Take 1-2 tablets (325-650 mg total) by mouth every 6 (six) hours as needed for mild pain (pain score 1-3 or temp > 100.5). 60 tablet 0   albuterol  (VENTOLIN  HFA) 108 (90 Base) MCG/ACT inhaler INHALE 2 PUFFS BY MOUTH EVERY 6 HOURS AS NEEDED FOR WHEEZING OR  SHORTNESS  OF  BREATH 18 g 0   ascorbic acid (VITAMIN C) 500 MG tablet Take 500 mg by mouth daily.     ASPIRIN  LOW DOSE 81 MG chewable tablet CHEW 1 TABLET (81 MG TOTAL) BY MOUTH 2 (TWO) TIMES DAILY. TO PREVENT BLOOD CLOTS 180 tablet 1   BLACK ELDERBERRY PO Take 2 each by mouth daily.     Blood Glucose Monitoring Suppl (ONETOUCH VERIO) w/Device KIT Use to check blood sugar 2 times a day 1 kit 0   carvedilol  (COREG ) 25 MG tablet TAKE 1 TABLET BY MOUTH TWICE A DAY WITH FOOD 60 tablet 11   Cholecalciferol (VITAMIN D3 PO) Take 1 tablet by mouth daily.     ezetimibe  (ZETIA ) 10 MG tablet Take 1 tablet (10 mg total) by mouth daily. 90 tablet 3   famotidine  (PEPCID ) 20 MG tablet Take 1 tablet (20 mg total) by mouth daily. 90 tablet 3   fluticasone  (FLONASE ) 50 MCG/ACT nasal spray Place 2 sprays into both nostrils daily as needed for allergies or rhinitis.     glucose blood (ONETOUCH VERIO) test strip Use to check blood sugar 2 times a day 200 strip 3    hydrochlorothiazide  (HYDRODIURIL ) 25 MG tablet TAKE 1 TABLET DAILY 90 tablet 3   isosorbide  mononitrate (IMDUR ) 60 MG 24 hr tablet Take 1 tablet (60 mg total) by mouth daily. 90 tablet 1   Lancet Devices (ONE TOUCH DELICA LANCING DEV) MISC Use to check blood sugar 2 times a day 1 each 0   Lancets (ONETOUCH DELICA PLUS LANCET30G) MISC USE TO CHECK BLOOD SUGAR 2 TIMES A DAY 200 each 3   latanoprost (XALATAN) 0.005 % ophthalmic solution Place 1 drop into both eyes at bedtime.      levocetirizine (XYZAL) 5 MG tablet Take 5 mg by mouth daily.     losartan  (COZAAR ) 100 MG tablet Take 1 tablet (100 mg total) by mouth daily. 90 tablet 3   montelukast  (SINGULAIR ) 10 MG tablet Take 1 tablet (10 mg total) by mouth at bedtime. 90 tablet 3   Multiple Vitamin (MULTIVITAMIN WITH MINERALS) TABS tablet Take 1 tablet by mouth daily.     pantoprazole  (PROTONIX ) 40 MG tablet TAKE 1 TABLET BY MOUTH EVERY DAY 90 tablet 0   timolol (TIMOPTIC) 0.5 % ophthalmic solution Place 1 drop into both eyes every morning.     zinc gluconate 50 MG tablet Take 50 mg by mouth daily.     rosuvastatin  (CRESTOR )  20 MG tablet Take 1 tablet (20 mg total) by mouth daily. 90 tablet 3   tirzepatide  (MOUNJARO ) 2.5 MG/0.5ML Pen Inject 2.5 mg into the skin once a week. (Patient not taking: Reported on 11/09/2023) 2 mL 11   No current facility-administered medications on file prior to visit.    Allergies  Allergen Reactions   Lactose Intolerance (Gi)    Nickel Rash    Nickel jewlery causes bumps and rash on skin   Penicillins Rash    Pt states she has taken since intial  reaction without recurrence.  Has patient had a PCN reaction causing immediate rash, facial/tongue/throat swelling, SOB or lightheadedness with hypotension:  no Has patient had a PCN reaction causing severe rash involving mucus membranes or skin necrosis: no Has patient had a PCN reaction that required hospitalization no Has patient had a PCN reaction occurring within the  last 10 years: no If all of the above answers are "NO", then may proceed with Cephalospori    Past Medical History:  Diagnosis Date   Allergy    SEASONAL   Anemia    Arthritis    CAD S/P percutaneous coronary angioplasty 09/2013   a) Ostial AV G Cx - 2.5 mm Angiosculpt; mid LAD 40-60%; b) Myoview  07/2014: LOW RISK, small-severe fixed inferior defect c/w infarct w/o peri-infarct ischemia.   Chronic bronchitis (HCC)    "frequently; not q yr" (10/03/2013)   Diabetes mellitus without complication (HCC)    Type 2-diet controlled.    Diverticulosis    Essential hypertension    with prior Accelerated HTN   GERD (gastroesophageal reflux disease)    Hiatal hernia    Hx of non-ST elevation myocardial infarction (NSTEMI) 10/01/2013   Due to Accelerated HTN with existing CAD   Hyperlipemia    Migraine    "@ least once/month" (10/03/2013)   Myocardial infarction (HCC)    OSA (obstructive sleep apnea) 06/10/2016   Schatzki's ring    Seasonal allergies    Seizure (HCC)    1 in elementary school, never knew what caused it   Sinusitis    Spinal headache    during C-section of second child.     Past Surgical History:  Procedure Laterality Date   ABDOMINAL HYSTERECTOMY  1994   "partial"   CAPSULAR RELEASE Right 06/06/2020   Procedure: RIGHT SHOULDER MANIPULATION UNDER ANESTHESIA, ROTATOR CUFF REPAIR;  Surgeon: Jasmine Mesi, MD;  Location: [facility] OR;  Service: Orthopedics;  Laterality: Right;   CARDIAC CATHETERIZATION N/A 08/05/2015   Procedure: Left Heart Cath and Coronary Angiography;  Surgeon: Arleen Lacer, MD;  Location: [facility] INVASIVE CV LAB;  Service: Cardiovascular;  Laterality: N/A;   CESAREAN SECTION  1989   COLONOSCOPY     CORONARY ANGIOPLASTY  10/03/2013   95% ostial AV G Cx - 2.5 mm AngioSculpt Balloon PTCA; mid LAD 40-60%   LEFT HEART CATHETERIZATION WITH CORONARY ANGIOGRAM N/A 10/02/2013   Procedure: LEFT HEART CATHETERIZATION WITH CORONARY ANGIOGRAM;  Surgeon: Millicent Ally, MD;  Location: [facility] CATH LAB;  Service: Cardiovascular;  Laterality: N/A;   NASAL SEPTOPLASTY W/ TURBINOPLASTY  ~ 2007   NM MYOVIEW  LTD  08/22/2014    Low risk stress nuclear study with a small, severe, fixed defect in the distal inferior wall/apex suggestive of small prior infarct; no ischemia.  LV Wall Motion:  NL LV Function; NL Wall Motion   PERCUTANEOUS CORONARY STENT INTERVENTION (PCI-S) N/A 10/03/2013   cutting balloon angioplasty only no stent.  SINOSCOPY     TOTAL KNEE ARTHROPLASTY Left 02/02/2023   Procedure: LEFT TOTAL KNEE ARTHROPLASTY;  Surgeon: Jasmine Mesi, MD;  Location: [facility] OR;  Service: Orthopedics;  Laterality: Left;   TRANSTHORACIC ECHOCARDIOGRAM  10/02/2013   EF 55-60%; mild LVH, no RWMA,    TUBAL LIGATION  1989    Family History  Adopted: Yes  Problem Relation Age of Onset   Allergic rhinitis Mother    Diabetes Mother    Sinusitis Mother    Breast cancer Sister    Hypertension Sister    Colon cancer Neg Hx    Heart attack Neg Hx    Stroke Neg Hx     Social History   Socioeconomic History   Marital status: Divorced    Spouse name: Porfirio Bristol   Number of children: 2   Years of education: Not on file   Highest education level: Not on file  Occupational History   Occupation: Biomedical engineer  Tobacco Use   Smoking status: Never    Passive exposure: Never   Smokeless tobacco: Never  Vaping Use   Vaping status: Never Used  Substance and Sexual Activity   Alcohol use: Not Currently    Comment: occ   Drug use: No   Sexual activity: Yes    Birth control/protection: Surgical  Other Topics Concern   Not on file  Social History Narrative   She is a married mother of 2, grandmother 2. Her current marriage is than a pack years.    She work as an Systems analyst -  For [employer]   She has completed 3 years of college. She never smoked and does not drink All. She does do time and balancing exercises but does not do routine  of exercise.   She herself is adopted.   Social Drivers of Corporate investment banker Strain: Not on file  Food Insecurity: No Food Insecurity (02/15/2023)   Hunger Vital Sign    Worried About Running Out of Food in the Last Year: Never true    Ran Out of Food in the Last Year: Never true  Transportation Needs: No Transportation Needs (02/15/2023)   PRAPARE - Administrator, Civil Service (Medical): No    Lack of Transportation (Non-Medical): No  Physical Activity: Not on file  Stress: Not on file  Social Connections: Not on file  Intimate Partner Violence: Not on file   Review of Systems No loss of smell or taste No vomiting Appetite is off     Objective:   Physical Exam Constitutional:      General: She is not in acute distress. HENT:     Head:     Comments: Frontal > maxillary tenderness    Right Ear: Tympanic membrane and ear canal normal.     Left Ear: Tympanic membrane and ear canal normal.     Mouth/Throat:     Pharynx: No oropharyngeal exudate or posterior oropharyngeal erythema.  Neck:     Comments: ? Slightly tender anterior cervical nodes Pulmonary:     Effort: Pulmonary effort is normal.     Breath sounds: Normal breath sounds. No wheezing or rales.  Musculoskeletal:     Cervical back: Neck supple.  Neurological:     Mental Status: She is alert.            Assessment & Plan:

## 2023-11-09 NOTE — Telephone Encounter (Signed)
  Chief Complaint: sinusitis Symptoms: chest congestion/drainage causing SOB , patient has not checked for fever- but feels "warm" Frequency: Symptoms started Saturday night Pertinent Negatives: Patient denies dizziness Disposition: [] ED /[] Urgent Care (no appt availability in office) / [x] Appointment(In office/virtual)/ []  [facility]/ [] Home Care/ [] Refused Recommended Disposition /[] [facility]/ []  Follow-up with PCP Additional Notes: Patient states she is having severe allergy reaction to pollen with SOB  Copied from CRM #[id number]. Topic: Clinical - Red Word Triage >> Nov 09, 2023  8:57 AM Kita Perish H wrote: [facility] that prompted transfer to Nurse Triage: Congestion, nausea, shortness of breath Reason for Disposition  [1] MILD difficulty breathing (e.g., minimal/no SOB at rest, SOB with walking, pulse <100) AND [2] NEW-onset or WORSE than normal  Answer Assessment - Initial Assessment Questions 1. RESPIRATORY STATUS: "Describe your breathing?" (e.g., wheezing, shortness of breath, unable to speak, severe coughing)      Chest congestion- drainage, nausea 2. ONSET: "When did this breathing problem begin?"      Saturday night 3. PATTERN "Does the difficult breathing come and go, or has it been constant since it started?"      Sinus pressure 4. SEVERITY: "How bad is your breathing?" (e.g., mild, moderate, severe)    - MILD: No SOB at rest, mild SOB with walking, speaks normally in sentences, can lie down, no retractions, pulse < 100.    - MODERATE: SOB at rest, SOB with minimal exertion and prefers to sit, cannot lie down flat, speaks in phrases, mild retractions, audible wheezing, pulse 100-120.    - SEVERE: Very SOB at rest, speaks in single words, struggling to breathe, sitting hunched forward, retractions, pulse > 120      Exertion, sinus congestion 5. RECURRENT SYMPTOM: "Have you had difficulty breathing before?" If Yes, ask: "When was the last time?" and "What  happened that time?"      Yes- allergy/sinus hx 6. CARDIAC HISTORY: "Do you have any history of heart disease?" (e.g., heart attack, angina, bypass surgery, angioplasty)      2015- heart attack 7. LUNG HISTORY: "Do you have any history of lung disease?"  (e.g., pulmonary embolus, asthma, emphysema)     2021- COVID 8. CAUSE: "What do you think is causing the breathing problem?"      Sinusitis  9. OTHER SYMPTOMS: "Do you have any other symptoms? (e.g., dizziness, runny nose, cough, chest pain, fever)     Feels warm  Protocols used: Breathing Difficulty-A-AH

## 2023-11-09 NOTE — Assessment & Plan Note (Addendum)
 COVID negative Seems to be secondary to allergies Continue allergy regimen Doxy 100  bid x 7 days

## 2023-11-16 ENCOUNTER — Other Ambulatory Visit (INDEPENDENT_AMBULATORY_CARE_PROVIDER_SITE_OTHER): Payer: Medicaid Other

## 2023-11-16 ENCOUNTER — Ambulatory Visit: Payer: Self-pay | Admitting: Family Medicine

## 2023-11-16 DIAGNOSIS — E1159 Type 2 diabetes mellitus with other circulatory complications: Secondary | ICD-10-CM

## 2023-11-16 DIAGNOSIS — E785 Hyperlipidemia, unspecified: Secondary | ICD-10-CM | POA: Diagnosis not present

## 2023-11-16 LAB — LIPID PANEL
Cholesterol: 229 mg/dL — ABNORMAL HIGH (ref 0–200)
HDL: 36.2 mg/dL — ABNORMAL LOW (ref >39.00)
LDL Cholesterol: 130 mg/dL — ABNORMAL HIGH (ref 0–99)
NonHDL: 192.85
Total CHOL/HDL Ratio: 6
Triglycerides: 316 mg/dL — ABNORMAL HIGH (ref 0.0–149.0)
VLDL: 63.2 mg/dL — ABNORMAL HIGH (ref 0.0–40.0)

## 2023-11-16 LAB — HEMOGLOBIN A1C: Hgb A1c MFr Bld: 6.2 % (ref 4.6–6.5)

## 2023-11-16 LAB — COMPREHENSIVE METABOLIC PANEL WITH GFR
ALT: 15 U/L (ref 0–35)
AST: 15 U/L (ref 0–37)
Albumin: 4.1 g/dL (ref 3.5–5.2)
Alkaline Phosphatase: 80 U/L (ref 39–117)
BUN: 12 mg/dL (ref 6–23)
CO2: 25 meq/L (ref 19–32)
Calcium: 9.4 mg/dL (ref 8.4–10.5)
Chloride: 106 meq/L (ref 96–112)
Creatinine, Ser: 0.8 mg/dL (ref 0.40–1.20)
GFR: 79.16 mL/min (ref >60.00)
Glucose, Bld: 118 mg/dL — ABNORMAL HIGH (ref 70–99)
Potassium: 3.9 meq/L (ref 3.5–5.1)
Sodium: 140 meq/L (ref 135–145)
Total Bilirubin: 0.5 mg/dL (ref 0.2–1.2)
Total Protein: 7.5 g/dL (ref 6.0–8.3)

## 2023-11-16 LAB — MICROALBUMIN / CREATININE URINE RATIO
Creatinine,U: 227.3 mg/dL
Microalb Creat Ratio: 5.1 mg/g (ref 0.0–30.0)
Microalb, Ur: 1.1 mg/dL (ref 0.0–1.9)

## 2023-11-16 NOTE — Progress Notes (Signed)
 No critical labs need to be addressed urgently. We will discuss labs in detail at upcoming office visit.

## 2023-11-23 ENCOUNTER — Ambulatory Visit (INDEPENDENT_AMBULATORY_CARE_PROVIDER_SITE_OTHER): Payer: Medicaid Other | Admitting: Family Medicine

## 2023-11-23 ENCOUNTER — Encounter: Payer: Self-pay | Admitting: Family Medicine

## 2023-11-23 VITALS — BP 140/84 | HR 88 | Temp 99.4°F | Ht 68.75 in | Wt 266.0 lb

## 2023-11-23 DIAGNOSIS — I25119 Atherosclerotic heart disease of native coronary artery with unspecified angina pectoris: Secondary | ICD-10-CM

## 2023-11-23 DIAGNOSIS — E1159 Type 2 diabetes mellitus with other circulatory complications: Secondary | ICD-10-CM

## 2023-11-23 DIAGNOSIS — E66812 Obesity, class 2: Secondary | ICD-10-CM

## 2023-11-23 DIAGNOSIS — Z6839 Body mass index (BMI) 39.0-39.9, adult: Secondary | ICD-10-CM

## 2023-11-23 DIAGNOSIS — Z7985 Long-term (current) use of injectable non-insulin antidiabetic drugs: Secondary | ICD-10-CM

## 2023-11-23 DIAGNOSIS — G4733 Obstructive sleep apnea (adult) (pediatric): Secondary | ICD-10-CM | POA: Diagnosis not present

## 2023-11-23 DIAGNOSIS — I152 Hypertension secondary to endocrine disorders: Secondary | ICD-10-CM

## 2023-11-23 DIAGNOSIS — Z0000 Encounter for general adult medical examination without abnormal findings: Secondary | ICD-10-CM | POA: Diagnosis not present

## 2023-11-23 LAB — HM DIABETES FOOT EXAM

## 2023-11-23 MED ORDER — HYDROCHLOROTHIAZIDE 25 MG PO TABS
25.0000 mg | ORAL_TABLET | Freq: Every day | ORAL | 3 refills | Status: DC
  Filled 2024-03-07 – 2024-03-20 (×2): qty 30, 30d supply, fill #0
  Filled 2024-05-04: qty 30, 30d supply, fill #1

## 2023-11-23 MED ORDER — ROSUVASTATIN CALCIUM 20 MG PO TABS
20.0000 mg | ORAL_TABLET | Freq: Every day | ORAL | 3 refills | Status: DC
  Filled 2024-03-07 – 2024-03-20 (×2): qty 30, 30d supply, fill #0
  Filled 2024-05-04: qty 30, 30d supply, fill #1

## 2023-11-23 NOTE — Assessment & Plan Note (Addendum)
 Chronic,inadequate control in office today   Coreg  25 mg p.o. twice daily,  and losartan  100 mg p.o. daily restart hydrochlorothiazide  25 mg daily

## 2023-11-23 NOTE — Assessment & Plan Note (Signed)
Followed by cardiology. ?GLP 1 we will also provide cardiac protection. ?

## 2023-11-23 NOTE — Patient Instructions (Addendum)
 Restart hydrochlorothiazide .Restart checking BP at home.. Call is persistently elevated > 140/90.  Restart  crestor .

## 2023-11-23 NOTE — Progress Notes (Signed)
 Patient ID: Christine Barrett, female    DOB: [birth year], 63 y.o.   MRN: [id number]  This visit was conducted in person.  BP (!) 160/90   Pulse 88   Temp 99.4 F (37.4 C) (Temporal)   Ht 5' 8.75" (1.746 m)   Wt 266 lb (120.7 kg)   SpO2 97%   BMI 39.57 kg/m    CC:  Chief Complaint  Patient presents with   Annual Exam    Subjective:   HPI: Christine Barrett is a 63 y.o. female presenting on 11/23/2023 for Annual Exam  The patient presents for  complete physical and review of chronic health problems. He/She also has the following acute concerns today:  She has been having issues with allergies and sinus pressure.  Has completed antibiotics, doxycycline ... improved but still some sneeze, watery eyes. Has  Xyzal and singulair ... still issue.  Has been using pseudophedrine off and on... last took this 1 week.     DM: Excellent control on current regimen. .. Had stopped 5 mg ( could not tolerate after 90 days)... restart 2.5 mg daily.Aaron Aas tolerated so far. Lab Results  Component Value Date   HGBA1C 6.2 11/16/2023  FBS 110-120 2 hour postprandial  not checking, no numbers > 200     Wt Readings from Last 3 Encounters:  11/23/23 266 lb (120.7 kg)  11/09/23 263 lb (119.3 kg)  06/24/23 262 lb (118.8 kg)   Elevated Cholesterol: No longer at goal < 70 has been compliant with meds until last month because lost her medicine, also diet not great.  On crestor  20 mg daily... she may have bee out in the last month... refilled.  Zetia  10 mg daily Lab Results  Component Value Date   CHOL 229 (H) 11/16/2023   HDL 36.20 (L) 11/16/2023   LDLCALC 130 (H) 11/16/2023   LDLDIRECT 158.0 03/16/2022   TRIG 316.0 (H) 11/16/2023   CHOLHDL 6 11/16/2023  Using medications without problems: Muscle aches:  Diet compliance: working on low carb diet. Exercise: walking more as cramps are better. Other complaints:   HTN   Inadequate control on coreg  25 mg daily , hydrochlorothiazide  and losartan ... D/Cd  amlodipine  in past... likely need to restart.   Not checking... she may be out og hydrochlorothiazide ... will restart.  BP Readings from Last 3 Encounters:  11/23/23 (!) 160/90  11/09/23 (!) 176/94  06/24/23 124/84   Body mass index is 39.57 kg/m.    Relevant past medical, surgical, family and social history reviewed and updated as indicated. Interim medical history since our last visit reviewed. Allergies and medications reviewed and updated. Outpatient Medications Prior to Visit  Medication Sig Dispense Refill   albuterol  (VENTOLIN  HFA) 108 (90 Base) MCG/ACT inhaler INHALE 2 PUFFS BY MOUTH EVERY 6 HOURS AS NEEDED FOR WHEEZING OR  SHORTNESS  OF  BREATH 18 g 0   ascorbic acid (VITAMIN C) 500 MG tablet Take 500 mg by mouth daily.     ASPIRIN  LOW DOSE 81 MG chewable tablet CHEW 1 TABLET (81 MG TOTAL) BY MOUTH 2 (TWO) TIMES DAILY. TO PREVENT BLOOD CLOTS 180 tablet 1   BLACK ELDERBERRY PO Take 2 each by mouth daily.     Blood Glucose Monitoring Suppl (ONETOUCH VERIO) w/Device KIT Use to check blood sugar 2 times a day 1 kit 0   carvedilol  (COREG ) 25 MG tablet TAKE 1 TABLET BY MOUTH TWICE A DAY WITH FOOD 60 tablet 11   Cholecalciferol (VITAMIN D3 PO) Take 1 tablet  by mouth daily.     ezetimibe  (ZETIA ) 10 MG tablet Take 1 tablet (10 mg total) by mouth daily. 90 tablet 3   famotidine  (PEPCID ) 20 MG tablet Take 1 tablet (20 mg total) by mouth daily. 90 tablet 3   fluticasone  (FLONASE ) 50 MCG/ACT nasal spray Place 2 sprays into both nostrils daily as needed for allergies or rhinitis.     glucose blood (ONETOUCH VERIO) test strip Use to check blood sugar 2 times a day 200 strip 3   isosorbide  mononitrate (IMDUR ) 60 MG 24 hr tablet Take 1 tablet (60 mg total) by mouth daily. 90 tablet 1   Lancet Devices (ONE TOUCH DELICA LANCING DEV) MISC Use to check blood sugar 2 times a day 1 each 0   Lancets (ONETOUCH DELICA PLUS LANCET30G) MISC USE TO CHECK BLOOD SUGAR 2 TIMES A DAY 200 each 3   latanoprost  (XALATAN) 0.005 % ophthalmic solution Place 1 drop into both eyes at bedtime.      levocetirizine (XYZAL) 5 MG tablet Take 5 mg by mouth daily.     losartan  (COZAAR ) 100 MG tablet Take 1 tablet (100 mg total) by mouth daily. 90 tablet 3   montelukast  (SINGULAIR ) 10 MG tablet Take 1 tablet (10 mg total) by mouth at bedtime. 90 tablet 3   Multiple Vitamin (MULTIVITAMIN WITH MINERALS) TABS tablet Take 1 tablet by mouth daily.     pantoprazole  (PROTONIX ) 40 MG tablet TAKE 1 TABLET BY MOUTH EVERY DAY 90 tablet 0   timolol (TIMOPTIC) 0.5 % ophthalmic solution Place 1 drop into both eyes every morning.     tirzepatide  (MOUNJARO ) 2.5 MG/0.5ML Pen Inject 2.5 mg into the skin once a week. 2 mL 11   zinc gluconate 50 MG tablet Take 50 mg by mouth daily.     hydrochlorothiazide  (HYDRODIURIL ) 25 MG tablet TAKE 1 TABLET DAILY 90 tablet 3   acetaminophen  (TYLENOL ) 325 MG tablet Take 1-2 tablets (325-650 mg total) by mouth every 6 (six) hours as needed for mild pain (pain score 1-3 or temp > 100.5). 60 tablet 0   doxycycline  (VIBRA -TABS) 100 MG tablet Take 1 tablet (100 mg total) by mouth 2 (two) times daily. 14 tablet 0   rosuvastatin  (CRESTOR ) 20 MG tablet Take 1 tablet (20 mg total) by mouth daily. 90 tablet 3   No facility-administered medications prior to visit.     Per HPI unless specifically indicated in ROS section below Review of Systems  Constitutional:  Negative for fatigue and fever.  HENT:  Negative for congestion.   Eyes:  Negative for pain.  Respiratory:  Negative for cough and shortness of breath.   Cardiovascular:  Negative for chest pain, palpitations and leg swelling.  Gastrointestinal:  Negative for abdominal pain.  Genitourinary:  Negative for dysuria and vaginal bleeding.  Musculoskeletal:  Negative for back pain.  Neurological:  Negative for syncope, light-headedness and headaches.  Psychiatric/Behavioral:  Negative for dysphoric mood.    Objective:  BP (!) 160/90   Pulse 88    Temp 99.4 F (37.4 C) (Temporal)   Ht 5' 8.75" (1.746 m)   Wt 266 lb (120.7 kg)   SpO2 97%   BMI 39.57 kg/m   Wt Readings from Last 3 Encounters:  11/23/23 266 lb (120.7 kg)  11/09/23 263 lb (119.3 kg)  06/24/23 262 lb (118.8 kg)      Physical Exam Vitals and nursing note reviewed.  Constitutional:      General: She is not in acute  distress.    Appearance: Normal appearance. She is well-developed. She is not ill-appearing or toxic-appearing.  HENT:     Head: Normocephalic.     Right Ear: Hearing, tympanic membrane, ear canal and external ear normal.     Left Ear: Hearing, tympanic membrane, ear canal and external ear normal.     Nose: Nose normal.  Eyes:     General: Lids are normal. Lids are everted, no foreign bodies appreciated.     Conjunctiva/sclera: Conjunctivae normal.     Pupils: Pupils are equal, round, and reactive to light.  Neck:     Thyroid: No thyroid mass or thyromegaly.     Vascular: No carotid bruit.     Trachea: Trachea normal.  Cardiovascular:     Rate and Rhythm: Normal rate and regular rhythm.     Heart sounds: Normal heart sounds, S1 normal and S2 normal. No murmur heard.    No gallop.  Pulmonary:     Effort: Pulmonary effort is normal. No respiratory distress.     Breath sounds: Normal breath sounds. No wheezing, rhonchi or rales.  Abdominal:     General: Bowel sounds are normal. There is no distension or abdominal bruit.     Palpations: Abdomen is soft. There is no fluid wave or mass.     Tenderness: There is no abdominal tenderness. There is no guarding or rebound.     Hernia: No hernia is present.  Musculoskeletal:     Cervical back: Normal range of motion and neck supple.  Lymphadenopathy:     Cervical: No cervical adenopathy.  Skin:    General: Skin is warm and dry.     Findings: No rash.  Neurological:     Mental Status: She is alert.     Cranial Nerves: No cranial nerve deficit.     Sensory: No sensory deficit.  Psychiatric:         Mood and Affect: Mood is not anxious or depressed.        Speech: Speech normal.        Behavior: Behavior normal. Behavior is cooperative.        Judgment: Judgment normal.   Diabetic foot exam: Normal inspection No skin breakdown No calluses  Normal DP pulses Normal sensation to light touch and monofilament Nails normal     Results for orders placed or performed in visit on 11/23/23  HM DIABETES FOOT EXAM   Collection Time: 11/23/23 12:00 AM  Result Value Ref Range   HM Diabetic Foot Exam done     This visit occurred during the SARS-CoV-2 public health emergency.  Safety protocols were in place, including screening questions prior to the visit, additional usage of staff PPE, and extensive cleaning of exam room while observing appropriate contact time as indicated for disinfecting solutions.   COVID 19 screen:  No recent travel or known exposure to COVID19 The patient denies respiratory symptoms of COVID 19 at this time. The importance of social distancing was discussed today.   Assessment and Plan   The patient's preventative maintenance and recommended screening tests for an annual wellness exam were reviewed in full today. Brought up to date unless services declined.  Counselled on the importance of diet, exercise, and its role in overall health and mortality. The patient's FH and SH was reviewed, including their home life, tobacco status, and drug and alcohol status.   Hep C screening neg 2020  Uptodate with Tdap  flu and encouraged COVID vaccine.  Consider Shingrix  vaccine and PNA... [place].  Last pap 2015 neg PAP and HPV neg... Partial hysterectomy, no family history of ovarian cancer.  Mammogram 09/2023,  Sister with breast cancer.Aaron AasAaron AasCalculated pt at lifetime risk of 21% on mammo repeot in 2025... MRI indicated for breast cancer screening > 20% but BRCA negative sister.... will look for risk calc that takes this into account.  colon: 10/2014  repeat in 10 years Dr. Bridgett Camps  Problem List Items Addressed This Visit     Class 2 severe obesity with serious comorbidity and body mass index (BMI) of 39.0 to 39.9 in adult (HCC)   Mounjarno..  DM and CAD history  Encouraged exercise, weight loss, healthy eating habits.        Coronary artery disease involving native heart with angina pectoris, unspecified vessel or lesion type (HCC)   Followed by cardiology. GLP 1 we will also provide cardiac protection.      Relevant Medications   hydrochlorothiazide  (HYDRODIURIL ) 25 MG tablet   rosuvastatin  (CRESTOR ) 20 MG tablet   Hypertension associated with diabetes (HCC)    Chronic,inadequate control in office today   Coreg  25 mg p.o. twice daily,  and losartan  100 mg p.o. daily restart hydrochlorothiazide  25 mg daily       Relevant Medications   hydrochlorothiazide  (HYDRODIURIL ) 25 MG tablet   rosuvastatin  (CRESTOR ) 20 MG tablet   OSA (obstructive sleep apnea)   On CPAP per pulmonary.      Type 2 diabetes mellitus with other circulatory complications, HTN, CVD (HCC)   Chronic, good control on Mounjaro  2.5 mg weekly,  Did not tolerate 5 mg.. gave SE. She lost a lot of her weight with just the 2.5 mg daily... makes her eat less.   Her insurance is changing and she may nee referral to Pharmacy.Aaron Aas she will let me know.         Relevant Medications   hydrochlorothiazide  (HYDRODIURIL ) 25 MG tablet   rosuvastatin  (CRESTOR ) 20 MG tablet   Other Visit Diagnoses       Routine general medical examination at a health care facility    -  Primary         Herby Lolling, MD

## 2023-11-23 NOTE — Assessment & Plan Note (Addendum)
 Chronic, good control on Mounjaro  2.5 mg weekly,  Did not tolerate 5 mg.. gave SE. She lost a lot of her weight with just the 2.5 mg daily... makes her eat less.   Her insurance is changing and she may nee referral to Pharmacy.Aaron Aas she will let me know.

## 2023-11-23 NOTE — Assessment & Plan Note (Addendum)
 Mounjarno..  DM and CAD history  Encouraged exercise, weight loss, healthy eating habits.

## 2023-11-23 NOTE — Assessment & Plan Note (Signed)
 On CPAP per pulmonary.

## 2023-12-20 ENCOUNTER — Telehealth: Payer: Self-pay

## 2023-12-20 ENCOUNTER — Other Ambulatory Visit (HOSPITAL_COMMUNITY): Payer: Self-pay

## 2023-12-20 NOTE — Telephone Encounter (Signed)
 Pharmacy Patient Advocate Encounter   Received notification from Pt Calls Messages that prior authorization for MOUNJARO  is required/requested.   Insurance verification completed.   The patient is insured through [employer] .   Per test claim: PA required; PA submitted to above mentioned insurance via CoverMyMeds Key/confirmation #/EOC [id number] Status is pending

## 2023-12-20 NOTE — Telephone Encounter (Signed)
 Pharmacy Patient Advocate Encounter  Insurance verification completed.   The patient is insured through [facility]   Ran test claim for Mounjaro  2.5MG /0.5ML auto-injectors. Currently a quantity of 2ml is a 28 day supply and the co-pay is COVERAGE EXPIRED 11/27/2023 .PLEASE SEE TEST BILLING BELOW PLEASE ADVISE PT NEED NEW INSURANCE.  This test claim was processed through [facility]- copay amounts may vary at other pharmacies due to pharmacy/plan contracts, or as the patient moves through the different stages of their insurance plan.

## 2023-12-20 NOTE — Telephone Encounter (Signed)
 [employer]

## 2023-12-21 ENCOUNTER — Other Ambulatory Visit (HOSPITAL_COMMUNITY): Payer: Self-pay

## 2023-12-21 NOTE — Telephone Encounter (Signed)
 Pharmacy Patient Advocate Encounter  Received notification from [facility] that Prior Authorization for Mounjaro  2.5MG /0.5ML auto-injectors  has been APPROVED from 12/21/2023 to 12/20/2024 SEE OUTCOME BELOW Approved. MOUNJARO  2.5MG /0.5ML Soln Auto-inj is approved from 12/21/2023 to 12/20/2024. All strengths of the drug are approved.. Authorization Expiration Date: December 20, 2024.

## 2023-12-22 ENCOUNTER — Other Ambulatory Visit: Payer: Self-pay | Admitting: Family Medicine

## 2023-12-22 DIAGNOSIS — Z803 Family history of malignant neoplasm of breast: Secondary | ICD-10-CM

## 2023-12-22 DIAGNOSIS — Z9189 Other specified personal risk factors, not elsewhere classified: Secondary | ICD-10-CM

## 2023-12-25 ENCOUNTER — Other Ambulatory Visit: Payer: Self-pay | Admitting: Family Medicine

## 2023-12-25 DIAGNOSIS — R1310 Dysphagia, unspecified: Secondary | ICD-10-CM

## 2024-01-05 ENCOUNTER — Encounter: Payer: Self-pay | Admitting: Family Medicine

## 2024-01-11 ENCOUNTER — Telehealth: Payer: Self-pay | Admitting: *Deleted

## 2024-01-11 NOTE — Telephone Encounter (Signed)
 Copied from CRM [id number]. Topic: Referral - Status >> Jan 11, 2024  9:43 AM Christine Barrett wrote: Reason for CRM: Pt wanted Dr. Avelina to know that she received letter from insurance company approving the MRI but the approval is only good until 02/01/2024.  Patient callback is [phone]

## 2024-01-11 NOTE — Telephone Encounter (Signed)
 PA faxed to [facility] as requested.

## 2024-01-11 NOTE — Telephone Encounter (Signed)
 Copied from CRM [id number]. Topic: General - Call Back - No Documentation >> Jan 11, 2024 12:50 PM Leah C wrote: Reason for CRM: Latoya from [facility] stated that patient called in to schedule MRI but they need pre-authorization to be faxed.   The fax number is [phone] for the MRI for breat w/w.o contrast.

## 2024-01-24 ENCOUNTER — Telehealth: Payer: Self-pay | Admitting: *Deleted

## 2024-01-24 NOTE — Telephone Encounter (Signed)
 Results:  Impression: Indeterminate 4mm enhancing mass in  the lower inner right breast. Indeterminate focal non mass enhancement in the upper outer left breast measuring 1.4 cm   Recommendation: MRI guided core needle biopsy x 1 of the right breast MRI guided core needed biopsy x 1 if the left breast  These will likely need to be scheduled on separate days for medial approach of the right breast and lateral approach of the left breast.   BI-RADS CATEGORY 4: Suspicious

## 2024-01-24 NOTE — Telephone Encounter (Signed)
 Copied from CRM [id number]. Topic: Clinical - Lab/Test Results >> Jan 24, 2024 11:59 AM Suzen RAMAN wrote: Reason for CRM: Randine from [facility] would like a call back to provide MRI breast results and obtain recommendations.  CB#[phone]

## 2024-01-24 NOTE — Telephone Encounter (Signed)
 Called [facility].  They are going to fax over copy of MRI.  Awaiting fax.

## 2024-01-25 ENCOUNTER — Ambulatory Visit: Payer: Self-pay | Admitting: Family Medicine

## 2024-01-25 ENCOUNTER — Encounter: Payer: Self-pay | Admitting: Family Medicine

## 2024-01-26 ENCOUNTER — Other Ambulatory Visit: Payer: Self-pay | Admitting: Family Medicine

## 2024-01-26 NOTE — Progress Notes (Signed)
 Christine Barrett/Christine Barrett Can you contact [facility] at [address]. [address], [place] [address] Phone: [phone].... how do I move forward with a referral for  both a right and left breast biopsy at this location? How to I order?

## 2024-01-26 NOTE — Telephone Encounter (Signed)
 See MyChart message patient sent today about location she needs biopsies scheduled.

## 2024-01-28 ENCOUNTER — Other Ambulatory Visit: Payer: Self-pay | Admitting: Family Medicine

## 2024-01-28 MED ORDER — ALPRAZOLAM 0.25 MG PO TABS: 0.2500 mg | ORAL_TABLET | Freq: Every day | ORAL | 0 refills | Status: DC | PRN

## 2024-02-01 ENCOUNTER — Other Ambulatory Visit

## 2024-02-04 ENCOUNTER — Other Ambulatory Visit: Payer: Self-pay | Admitting: Family Medicine

## 2024-02-08 ENCOUNTER — Telehealth: Payer: Self-pay | Admitting: *Deleted

## 2024-02-08 DIAGNOSIS — E785 Hyperlipidemia, unspecified: Secondary | ICD-10-CM

## 2024-02-08 NOTE — Telephone Encounter (Signed)
 Call patient as I am not sure what this is in regards to.

## 2024-02-08 NOTE — Telephone Encounter (Signed)
-----   Message from Veva JINNY Ferrari sent at 02/08/2024  3:00 PM EDT ----- Regarding: Lab orders for [facility]. 8.27.25 Lab orders, thanks

## 2024-02-08 NOTE — Telephone Encounter (Signed)
 Lab appointment states [facility].  Do you know what this is in regards to as far as lab orders?

## 2024-02-09 NOTE — Addendum Note (Signed)
 Addended by: WENDELL ARLAND RAMAN on: 02/09/2024 09:15 AM   Modules accepted: Orders

## 2024-02-09 NOTE — Telephone Encounter (Signed)
 Spoke with patient.  She states that is just her insurance.  AVS states Lipid panel only in 3 months.  Do you want to CMET as well?

## 2024-02-09 NOTE — Addendum Note (Signed)
 Addended byBETHA AVELINA NO E on: 02/09/2024 01:22 PM   Modules accepted: Orders

## 2024-02-17 ENCOUNTER — Encounter: Payer: Self-pay | Admitting: Family Medicine

## 2024-02-18 ENCOUNTER — Ambulatory Visit: Payer: Self-pay | Admitting: Family Medicine

## 2024-02-21 NOTE — Telephone Encounter (Signed)
 Copied from CRM #[id number]. Topic: General - Call Back - No Documentation >> Feb 21, 2024  9:29 AM Avram MATSU wrote: Reason for CRM: Cena is calling from [facility] and stated Pt recently had a breast MRI and still is needing a insurance authorization.  Callback-[phone] [phone]

## 2024-02-22 ENCOUNTER — Telehealth: Payer: Self-pay | Admitting: *Deleted

## 2024-02-22 ENCOUNTER — Other Ambulatory Visit: Payer: Self-pay | Admitting: Family Medicine

## 2024-02-22 DIAGNOSIS — R928 Other abnormal and inconclusive findings on diagnostic imaging of breast: Secondary | ICD-10-CM

## 2024-02-22 NOTE — Telephone Encounter (Signed)
 See Scanned Order under Media Tab on 02/02/24

## 2024-02-22 NOTE — Addendum Note (Signed)
 Addended by: AVELINA NO E on: 02/22/2024 05:09 PM   Modules accepted: Orders

## 2024-02-22 NOTE — Telephone Encounter (Signed)
 Yes an order for the Biopsy will have to be placed.   Per Care Everywhere, the order was MRI BREAST RIGHT W WO GUIDED BX  I called [facility] to verify with Cena the order, the order should be CPT [phone]

## 2024-02-22 NOTE — Telephone Encounter (Signed)
 I called Miranda ([facility]) because this message was very confusing. The Auth on File which expired on 02/02/24 was for the MRI Breast Bil W WO scan that was completed on 01/17/24.   Miranda states that this Shara that is needed was for the MRI Breast Bx order that was sent over by Dr Avelina.   I do not see any orders in Epic that were placed by Dr Avelina so therefor there was no auth ever completed on this order request. I see notes in Epic where Dr Avelina is discussing with the patient the need for the biopsy but no physical order was ever placed.   [facility] completed the Biopsy without an Auth on file on 02/18/24. They stated again that our office needed to complete the Auth because Dr Avelina ordered the Biopsy -- I do not see any orders in Epic for this Biopsy.  I cannot do any Authorization without a physical order. We will have to place an order and submit this for Retro Approval to insurance.  Please advise Dr Avelina. Thanks.    Information for Auth:  Retro Auth: MRI Breast Bx Location: [facility]) [place]: [phone] Tax ID: [id number]

## 2024-02-22 NOTE — Telephone Encounter (Signed)
 Copied from CRM #[id number]. Topic: General - Call Back - No Documentation >> Feb 21, 2024  9:29 AM Avram MATSU wrote: Reason for CRM: Cena is calling from [facility] and stated Pt recently had a breast MRI and still is needing a insurance authorization.  [phone] Fax:[phone] >> Feb 22, 2024  8:25 AM Rosina BIRCH wrote: Cena called from [facility] wanting an update on a fax request. The one that was faxed on yesterday, the authorization expired on 8/6 and the appointment is 8/22 and so she need another authorization. Miranda stated the fax that was faxed stated the MRI was done at [facility] but the scan was done at [place]  [facility] CB [phone]

## 2024-02-22 NOTE — Addendum Note (Signed)
 Addended by: AVELINA NO E on: 02/22/2024 05:19 PM   Modules accepted: Orders

## 2024-02-22 NOTE — Telephone Encounter (Signed)
 Okay, thanks you for calling her when I put in CPT [phone]... It orders just a right breast MRI, no biopsy mentioned:    MR BREAST RIGHT W WO CONTRAST INC CAD   No order appears with: MRI BREAST RIGHT W WO GUIDED BX    I can find the below  order.  Finally it was actually 2 breast biopsy she had to have 1 in each breast. So I probably need to do the right and the left.  I placed both... let me know if these are wrong.

## 2024-02-22 NOTE — Telephone Encounter (Signed)
 Please submit PA for Breast MRI that's being requested.

## 2024-02-23 ENCOUNTER — Other Ambulatory Visit (INDEPENDENT_AMBULATORY_CARE_PROVIDER_SITE_OTHER)

## 2024-02-23 ENCOUNTER — Ambulatory Visit: Payer: Self-pay | Admitting: Family Medicine

## 2024-02-23 DIAGNOSIS — E785 Hyperlipidemia, unspecified: Secondary | ICD-10-CM

## 2024-02-23 LAB — COMPREHENSIVE METABOLIC PANEL WITH GFR
ALT: 19 U/L (ref 0–35)
AST: 16 U/L (ref 0–37)
Albumin: 4.1 g/dL (ref 3.5–5.2)
Alkaline Phosphatase: 82 U/L (ref 39–117)
BUN: 11 mg/dL (ref 6–23)
CO2: 26 meq/L (ref 19–32)
Calcium: 9 mg/dL (ref 8.4–10.5)
Chloride: 104 meq/L (ref 96–112)
Creatinine, Ser: 0.75 mg/dL (ref 0.40–1.20)
GFR: 85.37 mL/min (ref >60.00)
Glucose, Bld: 184 mg/dL — ABNORMAL HIGH (ref 70–99)
Potassium: 3.7 meq/L (ref 3.5–5.1)
Sodium: 141 meq/L (ref 135–145)
Total Bilirubin: 0.8 mg/dL (ref 0.2–1.2)
Total Protein: 7.3 g/dL (ref 6.0–8.3)

## 2024-02-23 LAB — LIPID PANEL
Cholesterol: 158 mg/dL (ref 0–200)
HDL: 44.8 mg/dL (ref >39.00)
LDL Cholesterol: 83 mg/dL (ref 0–99)
NonHDL: 113.53
Total CHOL/HDL Ratio: 4
Triglycerides: 152 mg/dL — ABNORMAL HIGH (ref 0.0–149.0)
VLDL: 30.4 mg/dL (ref 0.0–40.0)

## 2024-02-23 NOTE — Telephone Encounter (Signed)
 I have cancelled the MRI guided biopsy orders.  I only put these in because it was what makes sense as she had 2  separate MRI guided breast biopsies.  I went ahead and put in as requested simply the CPT [phone], I chose right since that is what you have specified.  If this is all they need good, but it makes no sense.

## 2024-02-23 NOTE — Telephone Encounter (Signed)
 The two Bx orders you placed are not the correct CPT code.   They requested the CPT [phone] which would be the MR BREAST RIGHT W WO CONTRAST INC CAD - They tacked on a Bx to this order - stated that the Bx should not require Auth but the type of MRI they performed (CPT [phone]) does.  In the media section of the chart the results are scanned in on 02/18/24 and the MRI done was the MR BREAST RIGHT W WO CONTRAST INC CAD and it states the procedure performed was MRI with Biopsy.   Per Miranda with [facility], we need to precert [phone]

## 2024-02-23 NOTE — Telephone Encounter (Signed)
 I will submit the auth for CPT [phone] as they requested. When I spoke with Christine Barrett yesterday she was very adamant that all they needed was that one CPT code precerted and nothing else. I specifically asked about Bx orders and she stated no, those additional orders were not needed bc the Bx part would not require Auth.   It makes no sense to me either why they dont want us  to precert additional biopsy orders but with them being a different Health System than us  maybe they do things differently? I am not sure.  I will let you know if anything else comes up.   Thanks!

## 2024-02-23 NOTE — Telephone Encounter (Signed)
 Per Note from Ashtyn this morning:  Connell Mania. I think Dr Avelina is getting confused and putting in random orders.  Its causing confusion. All I need is the order for CPT [phone] not all those others

## 2024-02-25 NOTE — Telephone Encounter (Signed)
 Called Miranda with [facility] and gave Authorization information for Retro MRI Bx CPT 22951  Request ID: [id number] Tracking: [id number] Request Date: 02/23/2024 11:58 AM  Status:Approved (Retrospective) Entry Method:  RadMD             Validity Dates: 02/16/2024-03/17/2024  Sending to Dr Avelina to make aware that this has been approved.   Thank you for all your help!  -Gaylon Seip, [facility] Referral Coordinator

## 2024-03-02 ENCOUNTER — Encounter (HOSPITAL_BASED_OUTPATIENT_CLINIC_OR_DEPARTMENT_OTHER): Payer: Self-pay

## 2024-03-03 ENCOUNTER — Ambulatory Visit (HOSPITAL_BASED_OUTPATIENT_CLINIC_OR_DEPARTMENT_OTHER): Admitting: Cardiology

## 2024-03-03 ENCOUNTER — Encounter (HOSPITAL_BASED_OUTPATIENT_CLINIC_OR_DEPARTMENT_OTHER): Payer: Self-pay

## 2024-03-07 ENCOUNTER — Other Ambulatory Visit (HOSPITAL_COMMUNITY): Payer: Self-pay

## 2024-03-07 ENCOUNTER — Other Ambulatory Visit: Payer: Self-pay

## 2024-03-07 MED FILL — Pantoprazole Sodium EC Tab 40 MG (Base Equiv): ORAL | 30 days supply | Qty: 30 | Fill #0 | Status: CN

## 2024-03-09 ENCOUNTER — Other Ambulatory Visit (HOSPITAL_COMMUNITY): Payer: Self-pay

## 2024-03-16 ENCOUNTER — Other Ambulatory Visit (HOSPITAL_COMMUNITY): Payer: Self-pay

## 2024-03-20 ENCOUNTER — Other Ambulatory Visit (HOSPITAL_COMMUNITY): Payer: Self-pay

## 2024-03-20 MED FILL — Pantoprazole Sodium EC Tab 40 MG (Base Equiv): ORAL | 30 days supply | Qty: 30 | Fill #0 | Status: AC

## 2024-03-24 ENCOUNTER — Other Ambulatory Visit: Payer: Self-pay | Admitting: Medical Genetics

## 2024-03-28 ENCOUNTER — Telehealth: Payer: Self-pay

## 2024-03-28 ENCOUNTER — Other Ambulatory Visit (HOSPITAL_COMMUNITY): Payer: Self-pay

## 2024-03-28 NOTE — Telephone Encounter (Signed)
 Pharmacy Patient Advocate Encounter   Received notification from Onbase that prior authorization for Mounjaro  2.5 is due for renewal.   Insurance verification completed.   The patient is insured through [employer].  Action: Medication is now available without a prior authorization.

## 2024-04-23 ENCOUNTER — Ambulatory Visit (HOSPITAL_COMMUNITY)
Admission: EM | Admit: 2024-04-23 | Discharge: 2024-04-23 | Disposition: A | Discharge status: Home / Self Care | Payer: Self-pay | Attending: Nurse Practitioner | Admitting: Nurse Practitioner

## 2024-04-23 ENCOUNTER — Other Ambulatory Visit: Payer: Self-pay

## 2024-04-23 ENCOUNTER — Encounter (HOSPITAL_COMMUNITY): Payer: Self-pay | Admitting: *Deleted

## 2024-04-23 DIAGNOSIS — R0981 Nasal congestion: Secondary | ICD-10-CM

## 2024-04-23 LAB — POC SOFIA SARS ANTIGEN FIA: SARS Coronavirus 2 Ag: NEGATIVE

## 2024-04-23 MED ORDER — CETIRIZINE-PSEUDOEPHEDRINE ER 5-120 MG PO TB12: 1.0000 | ORAL_TABLET | Freq: Every day | ORAL | 0 refills | Status: AC

## 2024-04-23 MED ORDER — FLUTICASONE PROPIONATE 50 MCG/ACT NA SUSP: 2.0000 | Freq: Every day | NASAL | 0 refills | Status: AC

## 2024-04-23 MED ORDER — PROMETHAZINE-DM 6.25-15 MG/5ML PO SYRP: 10.0000 mL | ORAL_SOLUTION | Freq: Four times a day (QID) | ORAL | 0 refills | Status: AC | PRN

## 2024-04-23 MED ORDER — AZITHROMYCIN 250 MG PO TABS: 250.0000 mg | ORAL_TABLET | Freq: Every day | ORAL | 0 refills | Status: AC

## 2024-04-23 MED ORDER — PREDNISONE 10 MG PO TABS: ORAL_TABLET | ORAL | 0 refills | Status: AC

## 2024-04-23 MED ORDER — IPRATROPIUM-ALBUTEROL 0.5-2.5 (3) MG/3ML IN SOLN
3.0000 mL | Freq: Once | RESPIRATORY_TRACT | Status: AC
  Administered 2024-04-23: 3 mL via RESPIRATORY_TRACT

## 2024-04-23 MED ORDER — ALBUTEROL SULFATE HFA 108 (90 BASE) MCG/ACT IN AERS: 2.0000 | INHALATION_SPRAY | RESPIRATORY_TRACT | 0 refills | Status: AC | PRN

## 2024-04-23 MED ORDER — IPRATROPIUM-ALBUTEROL 0.5-2.5 (3) MG/3ML IN SOLN
RESPIRATORY_TRACT | Status: AC
  Filled 2024-04-23: qty 3

## 2024-04-23 NOTE — ED Triage Notes (Signed)
 PT reports SHOB and sinus problems since Thursday the patient took OTC with out relief.

## 2024-04-23 NOTE — ED Provider Notes (Signed)
 [facility]    CSN: [id number] Arrival date & time: 04/23/24  1341      History   Chief Complaint Chief Complaint  Patient presents with   Facial Pain   Shortness of Breath    HPI Christine Barrett is a 63 y.o. female.   Discussed the use of AI scribe software for clinical note transcription with the patient, who gave verbal consent to proceed.   Patient presents with a 4-day history of sinus symptoms and shortness of breath. She reports nasal congestion with post-nasal drainage that has progressed into her chest, leading to chest tightness and a sensation of shortness of breath. Throat pain began today. She also notes sinus pressure and headache. Nasal discharge was initially clear but became yellow last night. She denies nausea, vomiting, or diarrhea. She has been using a nasal mist spray for symptom relief and had been taking Sudafed previously but ran out. No other medications taken.   The following sections of the patient's history were reviewed and updated as appropriate: allergies, current medications, past family history, past medical history, past social history, past surgical history, and problem list.      Past Medical History:  Diagnosis Date   Allergy    SEASONAL   Anemia    Arthritis    CAD S/P percutaneous coronary angioplasty 09/2013   a) Ostial AV G Cx - 2.5 mm Angiosculpt; mid LAD 40-60%; b) Myoview  07/2014: LOW RISK, small-severe fixed inferior defect c/w infarct w/o peri-infarct ischemia.   Chronic bronchitis (HCC)    frequently; not q yr (10/03/2013)   Diabetes mellitus without complication (HCC)    Type 2-diet controlled.    Diverticulosis    Essential hypertension    with prior Accelerated HTN   GERD (gastroesophageal reflux disease)    Hiatal hernia    Hx of non-ST elevation myocardial infarction (NSTEMI) 10/01/2013   Due to Accelerated HTN with existing CAD   Hyperlipemia    Migraine    @ least once/month (10/03/2013)   Myocardial  infarction (HCC)    OSA (obstructive sleep apnea) 06/10/2016   Schatzki's ring    Seasonal allergies    Seizure (HCC)    1 in elementary school, never knew what caused it   Sinusitis    Spinal headache    during C-section of second child.     Patient Active Problem List   Diagnosis Date Noted   OA (osteoarthritis) of knee 02/02/2023   Status post total knee replacement, left 02/02/2023   Coronary artery disease involving native heart with angina pectoris, unspecified vessel or lesion type 10/29/2021   Trochanteric bursitis of left hip 04/08/2021   Other allergic rhinitis 01/01/2021   Frequent episodes of sinusitis 01/01/2021   History of bronchitis 01/01/2021   Allergic conjunctivitis of both eyes 01/01/2021   Family history of breast cancer in sister 09/20/2020   Type 2 diabetes mellitus with other circulatory complications, HTN, CVD (HCC) 05/17/2020   Class 2 severe obesity with serious comorbidity and body mass index (BMI) of 39.0 to 39.9 in adult 05/17/2020   Acute pain of both knees 08/28/2016   OSA (obstructive sleep apnea) 06/10/2016   Costochondritis 06/09/2016   Bilateral leg cramps 02/07/2016   Hypertension associated with diabetes (HCC)    GERD (gastroesophageal reflux disease) 08/10/2014   Vaginal dryness, menopausal 08/10/2014   CAD S/P percutaneous coronary angioplasty - Ostial AVG Cx - Scoring balloon PTCA 10/03/2013   Hyperlipidemia with target LDL less than 70 10/02/2013  H/O non-ST elevation myocardial infarction (NSTEMI) 10/02/2013   Normocytic anemia, not due to blood loss    Allergic sinusitis 03/17/2007   Migraine without aura 03/16/2007    Past Surgical History:  Procedure Laterality Date   ABDOMINAL HYSTERECTOMY  1994   partial   CAPSULAR RELEASE Right 06/06/2020   Procedure: RIGHT SHOULDER MANIPULATION UNDER ANESTHESIA, ROTATOR CUFF REPAIR;  Surgeon: Addie Cordella Hamilton, MD;  Location: [facility] OR;  Service: Orthopedics;  Laterality: Right;   CARDIAC  CATHETERIZATION N/A 08/05/2015   Procedure: Left Heart Cath and Coronary Angiography;  Surgeon: Alm LELON Clay, MD;  [facility] INVAS[facility] CV LAB;  Service: Cardiovascular;  Laterality: N/A;   CESAREAN SECTION  1989   COLONOSCOPY     CORONARY ANGIOPLASTY  10/03/2013   95% ostial AV G Cx - 2.5 mm AngioSculpt Balloon PTCA; mid LAD 40-60%   LEFT HEART CATHETERIZATION WITH CORONARY ANGIOGRAM N/A 10/02/2013   Procedure: LEFT HEART CATHETERIZATION WITH CORONARY ANGIOGRAM;  Surgeon: Debby DELENA Sor, MD;  Location: [facility] CATH LAB;  Service: Cardiovascular;  Laterality: N/A;   NASAL SEPTOPLASTY W/ TURBINOPLASTY  ~ 2007   NM MYOVIEW  LTD  08/22/2014    Low risk stress nuclear study with a small, severe, fixed defect in the distal inferior wall/apex suggestive of small prior infarct; no ischemia.  LV Wall Motion:  NL LV Function; NL Wall Motion   PERCUTANEOUS CORONARY STENT INTERVENTION (PCI-S) N/A 10/03/2013   cutting balloon angioplasty only no stent.    SINOSCOPY     TOTAL KNEE ARTHROPLASTY Left 02/02/2023   Procedure: LEFT TOTAL KNEE ARTHROPLASTY;  Surgeon: Addie Cordella Hamilton, MD;  Location: [facility] OR;  Service: Orthopedics;  Laterality: Left;   TRANSTHORACIC ECHOCARDIOGRAM  10/02/2013   EF 55-60%; mild LVH, no RWMA,    TUBAL LIGATION  1989    OB History   No obstetric history on file.      Home Medications    Prior to Admission medications   Medication Sig Start Date End Date Taking? Authorizing Provider  albuterol  (VENTOLIN  HFA) 108 (90 Base) MCG/ACT inhaler Inhale 2 puffs into the lungs every 4 (four) hours as needed for wheezing or shortness of breath (bronchospasms). 04/23/24  Yes Iola Lukes, FNP  ascorbic acid (VITAMIN C) 500 MG tablet Take 500 mg by mouth daily.   Yes [provider]  ASPIRIN  LOW DOSE 81 MG chewable tablet CHEW 1 TABLET (81 MG TOTAL) BY MOUTH 2 (TWO) TIMES DAILY. TO PREVENT BLOOD CLOTS 05/02/23  Yes Magnant, Carlin CROME, PA-C  azithromycin  (ZITHROMAX ) 250 MG  tablet Take 1 tablet (250 mg total) by mouth daily. Take first 2 tablets together, then 1 every day until finished. 04/23/24  Yes Yotam Rhine, Lukes, FNP  BLACK ELDERBERRY PO Take 2 each by mouth daily.   Yes [provider]  Blood Glucose Monitoring Suppl (ONETOUCH VERIO) w/Device KIT Use to check blood sugar 2 times a day 02/10/23  Yes [name], [name], MD  carvedilol  (COREG ) 25 MG tablet TAKE 1 TABLET BY MOUTH TWICE A DAY WITH FOOD 09/29/23  Yes [name], [name], MD  cetirizine -pseudoephedrine  (ZYRTEC -D) 5-120 MG tablet Take 1 tablet by mouth daily with breakfast for 10 days. 04/23/24 05/03/24 Yes Iola Lukes, FNP  ezetimibe  (ZETIA ) 10 MG tablet Take 1 tablet (10 mg total) by mouth daily. 01/29/23  Yes [name], [name], MD  famotidine  (PEPCID ) 20 MG tablet Take 1 tablet (20 mg total) by mouth daily. 05/21/23  Yes [name], [name], MD  fluticasone  (FLONASE ) 50  MCG/ACT nasal spray Place 2 sprays into both nostrils daily. Shake well before use. Gently blow nose before spraying. Do not blow nose immediately after use. You should not taste the medication or feel it going down your throat; if you do, adjust your technique. 04/23/24  Yes Iola Lukes, FNP  glucose blood (ONETOUCH VERIO) test strip Use to check blood sugar 2 times a day 11/09/22  Yes [name], [name], MD  hydrochlorothiazide  (HYDRODIURIL ) 25 MG tablet Take 1 tablet (25 mg total) by mouth daily. 11/23/23  Yes [name], [name], MD  isosorbide  mononitrate (IMDUR ) 60 MG 24 hr tablet TAKE 1 TABLET BY MOUTH EVERY DAY 02/04/24  Yes [name], [name], MD  Lancet Devices (ONE TOUCH DELICA LANCING DEV) MISC Use to check blood sugar 2 times a day 08/29/21  Yes [name], [name], MD  Lancets (ONETOUCH DELICA PLUS LANCET30G) MISC USE TO CHECK BLOOD SUGAR 2 TIMES A DAY 11/09/22  Yes [name], [name], MD  latanoprost (XALATAN) 0.005 % ophthalmic solution Place 1 drop into both eyes at bedtime.  05/28/20  Yes [provider]  losartan  (COZAAR ) 100 MG tablet  Take 1 tablet (100 mg total) by mouth daily. 05/21/23  Yes [name], [name], MD  montelukast  (SINGULAIR ) 10 MG tablet Take 1 tablet (10 mg total) by mouth at bedtime. 05/21/23  Yes [name], [name], MD  Multiple Vitamin (MULTIVITAMIN WITH MINERALS) TABS tablet Take 1 tablet by mouth daily.   Yes [provider]  pantoprazole  (PROTONIX ) 40 MG tablet Take 1 tablet (40 mg total) by mouth daily. 12/27/23  Yes [name], [name], MD  predniSONE  (DELTASONE ) 10 MG tablet Take 4 tablets (40 mg total) by mouth daily with breakfast for 1 day, THEN 3 tablets (30 mg total) daily with breakfast for 1 day, THEN 2 tablets (20 mg total) daily with breakfast for 1 day, THEN 1 tablet (10 mg total) daily with breakfast for 2 days. 04/23/24 04/28/24 Yes Iola Lukes, FNP  promethazine -dextromethorphan (PROMETHAZINE -DM) 6.25-15 MG/5ML syrup Take 10 mLs by mouth every 6 (six) hours as needed for cough. 04/23/24  Yes Iola Lukes, FNP  rosuvastatin  (CRESTOR ) 20 MG tablet Take 1 tablet (20 mg total) by mouth daily. 11/23/23 11/17/24 Yes [name], [name], MD  timolol (TIMOPTIC) 0.5 % ophthalmic solution Place 1 drop into both eyes every morning. 12/10/22  Yes [provider]  zinc gluconate 50 MG tablet Take 50 mg by mouth daily.   Yes [provider]  ALPRAZolam  (XANAX ) 0.25 MG tablet Take 1-2 tablets (0.25-0.5 mg total) by mouth daily as needed for anxiety (prior to procedure). 01/28/24   [name], [name], MD  Cholecalciferol (VITAMIN D3 PO) Take 1 tablet by mouth daily.    [provider]  tirzepatide  (MOUNJARO ) 2.5 MG/0.5ML Pen Inject 2.5 mg into the skin once a week. 10/26/23   Avelina Greig BRAVO, MD    Family History Family History  Adopted: Yes  Problem Relation Age of Onset   Allergic rhinitis Mother    Diabetes Mother    Sinusitis Mother    Breast cancer Sister    Hypertension Sister    Colon cancer Neg Hx    Heart attack Neg Hx    Stroke Neg Hx     Social History Social History    Tobacco Use   Smoking status: Never    Passive exposure: Never   Smokeless tobacco: Never  Vaping Use   Vaping status: Never Used  Substance Use Topics   Alcohol  use: Not Currently    Comment: occ   Drug use: No     Allergies   Lactose intolerance (gi), Nickel, and Penicillins   Review of Systems Review of Systems  Constitutional:  Negative for fever.  HENT:  Positive for congestion, postnasal drip, sinus pressure and sore throat (some).   Respiratory:  Positive for cough (productive), chest tightness (esp with coughing and deep breathing) and shortness of breath.   Gastrointestinal:  Negative for diarrhea, nausea and vomiting.  Neurological:  Positive for headaches.  All other systems reviewed and are negative.    Physical Exam Triage Vital Signs ED Triage Vitals [04/23/24 1427]  Encounter Vitals Group     BP (!) 159/89     Girls Systolic BP Percentile      Girls Diastolic BP Percentile      Boys Systolic BP Percentile      Boys Diastolic BP Percentile      Pulse Rate 84     Resp 20     Temp 99 F (37.2 C)     Temp src      SpO2 95 %     Weight      Height      Head Circumference      Peak Flow      Pain Score      Pain Loc      Pain Education      Exclude from Growth Chart    No data found.  Updated Vital Signs BP (!) 159/89   Pulse 84   Temp 99 F (37.2 C)   Resp 20   SpO2 95%   Visual Acuity Right Eye Distance:   Left Eye Distance:   Bilateral Distance:    Right Eye Near:   Left Eye Near:    Bilateral Near:     Physical Exam Vitals reviewed.  Constitutional:      General: She is awake. She is not in acute distress.    Appearance: Normal appearance. She is well-developed. She is not ill-appearing, toxic-appearing or diaphoretic.  HENT:     Head: Normocephalic.     Right Ear: Hearing, tympanic membrane, ear canal and external ear normal. No drainage, swelling or tenderness. No middle ear effusion. Tympanic membrane is not  erythematous.     Left Ear: Hearing, tympanic membrane, ear canal and external ear normal. No drainage, swelling or tenderness.  No middle ear effusion. Tympanic membrane is not erythematous.     Nose: Congestion present. No rhinorrhea.     Right Sinus: No maxillary sinus tenderness or frontal sinus tenderness.     Left Sinus: No maxillary sinus tenderness or frontal sinus tenderness.     Mouth/Throat:     Lips: Pink.     Mouth: Mucous membranes are moist.     Pharynx: Oropharynx is clear. Uvula midline. No pharyngeal swelling, oropharyngeal exudate, posterior oropharyngeal erythema or uvula swelling.     Tonsils: No tonsillar exudate or tonsillar abscesses.  Eyes:     General: Vision grossly intact.     Conjunctiva/sclera: Conjunctivae normal.  Cardiovascular:     Rate and Rhythm: Normal rate and regular rhythm.     Heart sounds: Normal heart sounds.  Pulmonary:     Effort: Pulmonary effort is normal.     Breath sounds: Normal breath sounds and air entry.  Musculoskeletal:        General: Normal range of motion.     Cervical back: Full passive range of motion without  pain, normal range of motion and neck supple.  Lymphadenopathy:     Cervical: No cervical adenopathy.  Skin:    General: Skin is warm and dry.  Neurological:     General: No focal deficit present.     Mental Status: She is alert and oriented to person, place, and time.  Psychiatric:        Mood and Affect: Mood normal.        Behavior: Behavior normal. Behavior is cooperative.      UC Treatments / Results  Labs (all labs ordered are listed, but only abnormal results are displayed) Labs Reviewed  POC SOFIA SARS ANTIGEN FIA    EKG   Radiology No results found.  Procedures Procedures (including critical care time)  Medications Ordered in UC Medications  ipratropium-albuterol  (DUONEB) 0.5-2.5 (3) MG/3ML nebulizer solution 3 mL (3 mLs Nebulization Given 04/23/24 1530)    Initial Impression /  Assessment and Plan / UC Course  I have reviewed the triage vital signs and the nursing notes.  Pertinent labs & imaging results that were available during my care of the patient were reviewed by me and considered in my medical decision making (see chart for details).     The patient presents with symptoms consistent with acute sinusitis. COVID negative. Treatment plan includes supportive care measures such as hydration, rest, and symptomatic relief. The importance of adherence to prescribed therapy was emphasized. Patient was advised to follow up with their primary care provider as needed and to seek emergency care for worsening or concerning symptoms.  Today's evaluation has revealed no signs of a dangerous process. Discussed diagnosis with patient and/or guardian. Patient and/or guardian aware of their diagnosis, possible red flag symptoms to watch out for and need for close follow up. Patient and/or guardian understands verbal and written discharge instructions. Patient and/or guardian comfortable with plan and disposition.  Patient and/or guardian has a clear mental status at this time, good insight into illness (after discussion and teaching) and has clear judgment to make decisions regarding their care  Documentation was completed with the aid of voice recognition software. Transcription may contain typographical errors.   Final Clinical Impressions(s) / UC Diagnoses   Final diagnoses:  Sinus congestion     Discharge Instructions      You have been diagnosed with a sinus infection. A sinus infection (sinusitis) happens when the sinuses become swollen and irritated. Take any medications prescribed to you as directed. You may use Tylenol  and/or ibuprofen  as needed for pain, headache, or fever, as long as these are safe for you. Use Flonase  nasal spray daily to help decrease inflammation in your sinuses. Drink plenty of fluids to keep your urine a light yellow color. Staying well hydrated  helps thin mucus and makes it easier to breathe. Using a cool mist humidifier at home can help keep the air from getting too dry. You may also inhale warm steam for 10-15 minutes several times a day. Sitting in the bathroom with the shower running or using vapor shower tablets can provide relief. Try to avoid cold or very dry air when possible. Sleeping with your head elevated can help reduce post-nasal drainage and nighttime coughing. Make sure to get plenty of rest each night. Once your symptoms improve and you have completed your medications, replace your toothbrush to help prevent re-infection. If your symptoms are not improving after you finish your treatment, please follow up with your primary care provider. Seek medical care right away if you  experience difficulty breathing, persistent high fever, severe headache, vision changes, confusion, or swelling around the eyes.     ED Prescriptions     Medication Sig Dispense Auth. Provider   cetirizine -pseudoephedrine  (ZYRTEC -D) 5-120 MG tablet Take 1 tablet by mouth daily with breakfast for 10 days. 10 tablet Iola Lukes, FNP   fluticasone  (FLONASE ) 50 MCG/ACT nasal spray Place 2 sprays into both nostrils daily. Shake well before use. Gently blow nose before spraying. Do not blow nose immediately after use. You should not taste the medication or feel it going down your throat; if you do, adjust your technique. 16 g Iola Lukes, FNP   promethazine -dextromethorphan (PROMETHAZINE -DM) 6.25-15 MG/5ML syrup Take 10 mLs by mouth every 6 (six) hours as needed for cough. 118 mL Iola Lukes, FNP   albuterol  (VENTOLIN  HFA) 108 (90 Base) MCG/ACT inhaler Inhale 2 puffs into the lungs every 4 (four) hours as needed for wheezing or shortness of breath (bronchospasms). 18 g Iola Lukes, FNP   azithromycin  (ZITHROMAX ) 250 MG tablet Take 1 tablet (250 mg total) by mouth daily. Take first 2 tablets together, then 1 every day until finished. 6 tablet  Iola Lukes, FNP   predniSONE  (DELTASONE ) 10 MG tablet Take 4 tablets (40 mg total) by mouth daily with breakfast for 1 day, THEN 3 tablets (30 mg total) daily with breakfast for 1 day, THEN 2 tablets (20 mg total) daily with breakfast for 1 day, THEN 1 tablet (10 mg total) daily with breakfast for 2 days. 11 tablet Iola Lukes, FNP      PDMP not reviewed this encounter.   Iola Lukes, [place] 04/23/24 [phone]

## 2024-04-23 NOTE — Discharge Instructions (Signed)
 You have been diagnosed with a sinus infection. A sinus infection (sinusitis) happens when the sinuses become swollen and irritated. Take any medications prescribed to you as directed. You may use Tylenol  and/or ibuprofen  as needed for pain, headache, or fever, as long as these are safe for you. Use Flonase  nasal spray daily to help decrease inflammation in your sinuses. Drink plenty of fluids to keep your urine a light yellow color. Staying well hydrated helps thin mucus and makes it easier to breathe. Using a cool mist humidifier at home can help keep the air from getting too dry. You may also inhale warm steam for 10-15 minutes several times a day. Sitting in the bathroom with the shower running or using vapor shower tablets can provide relief. Try to avoid cold or very dry air when possible. Sleeping with your head elevated can help reduce post-nasal drainage and nighttime coughing. Make sure to get plenty of rest each night. Once your symptoms improve and you have completed your medications, replace your toothbrush to help prevent re-infection. If your symptoms are not improving after you finish your treatment, please follow up with your primary care provider. Seek medical care right away if you experience difficulty breathing, persistent high fever, severe headache, vision changes, confusion, or swelling around the eyes.

## 2024-04-24 ENCOUNTER — Other Ambulatory Visit: Payer: Self-pay

## 2024-05-01 ENCOUNTER — Encounter: Payer: Self-pay | Admitting: Radiology

## 2024-05-04 MED FILL — Pantoprazole Sodium EC Tab 40 MG (Base Equiv): ORAL | 30 days supply | Qty: 30 | Fill #1 | Status: CN

## 2024-05-05 ENCOUNTER — Encounter: Payer: Self-pay | Admitting: Family Medicine

## 2024-05-05 ENCOUNTER — Other Ambulatory Visit: Payer: Self-pay

## 2024-05-05 DIAGNOSIS — J301 Allergic rhinitis due to pollen: Secondary | ICD-10-CM

## 2024-05-05 DIAGNOSIS — E785 Hyperlipidemia, unspecified: Secondary | ICD-10-CM

## 2024-05-05 DIAGNOSIS — R1310 Dysphagia, unspecified: Secondary | ICD-10-CM

## 2024-05-05 DIAGNOSIS — R0981 Nasal congestion: Secondary | ICD-10-CM

## 2024-05-05 DIAGNOSIS — I10 Essential (primary) hypertension: Secondary | ICD-10-CM

## 2024-05-05 MED ORDER — LOSARTAN POTASSIUM 100 MG PO TABS: 100.0000 mg | ORAL_TABLET | Freq: Every day | ORAL | 3 refills | Status: AC

## 2024-05-05 MED ORDER — CARVEDILOL 25 MG PO TABS: 25.0000 mg | ORAL_TABLET | Freq: Two times a day (BID) | ORAL | 11 refills | Status: AC

## 2024-05-05 MED ORDER — MONTELUKAST SODIUM 10 MG PO TABS: 10.0000 mg | ORAL_TABLET | Freq: Every day | ORAL | 3 refills | Status: AC

## 2024-05-05 MED ORDER — PANTOPRAZOLE SODIUM 40 MG PO TBEC: 40.0000 mg | DELAYED_RELEASE_TABLET | Freq: Every day | ORAL | 3 refills | Status: AC

## 2024-05-05 MED ORDER — ISOSORBIDE MONONITRATE ER 60 MG PO TB24: 60.0000 mg | ORAL_TABLET | Freq: Every day | ORAL | 1 refills | Status: AC

## 2024-05-05 MED ORDER — FAMOTIDINE 20 MG PO TABS
20.0000 mg | ORAL_TABLET | Freq: Every day | ORAL | 3 refills | Status: AC
Start: 2024-05-05 — End: ?

## 2024-05-05 MED ORDER — HYDROCHLOROTHIAZIDE 25 MG PO TABS: 25.0000 mg | ORAL_TABLET | Freq: Every day | ORAL | 3 refills | Status: AC

## 2024-05-05 MED ORDER — ROSUVASTATIN CALCIUM 20 MG PO TABS: 20.0000 mg | ORAL_TABLET | Freq: Every day | ORAL | 3 refills | Status: AC

## 2024-05-05 MED ORDER — EZETIMIBE 10 MG PO TABS: 10.0000 mg | ORAL_TABLET | Freq: Every day | ORAL | 3 refills | Status: AC

## 2024-05-05 NOTE — Telephone Encounter (Signed)
 Completed. 05/05/2024

## 2024-05-12 ENCOUNTER — Other Ambulatory Visit: Payer: Self-pay

## 2024-05-12 ENCOUNTER — Other Ambulatory Visit (HOSPITAL_COMMUNITY): Payer: Self-pay

## 2024-05-12 ENCOUNTER — Telehealth: Payer: Self-pay | Admitting: Family Medicine

## 2024-05-12 MED ORDER — TRULICITY 1.5 MG/0.5ML ~~LOC~~ SOAJ
1.5000 mg | SUBCUTANEOUS | 5 refills | Status: DC
  Filled 2024-05-12 (×2): qty 2, 28d supply, fill #0
  Filled 2024-05-30: qty 2, 28d supply, fill #1

## 2024-05-12 NOTE — Telephone Encounter (Signed)
 Called from [facility] outpatient pharmacy patient with trouble affording medication.  She has no current diabetes medication.  Pharmacist suggesting changing Mounjaro  to Trulicity which they have available through a patient assistance program.  Prescription sent to pharmacy.

## 2024-05-24 ENCOUNTER — Other Ambulatory Visit (HOSPITAL_COMMUNITY): Payer: Self-pay

## 2024-05-24 ENCOUNTER — Other Ambulatory Visit: Payer: Self-pay

## 2024-05-30 ENCOUNTER — Other Ambulatory Visit: Payer: Self-pay

## 2024-06-12 ENCOUNTER — Encounter: Payer: Self-pay | Admitting: Family Medicine

## 2024-06-13 ENCOUNTER — Other Ambulatory Visit (HOSPITAL_COMMUNITY): Payer: Self-pay

## 2024-06-13 ENCOUNTER — Other Ambulatory Visit: Payer: Self-pay | Admitting: Family Medicine

## 2024-06-13 MED ORDER — TRULICITY 3 MG/0.5ML ~~LOC~~ SOAJ: 3.0000 mg | SUBCUTANEOUS | 1 refills | Status: DC

## 2024-06-13 MED ORDER — TRULICITY 3 MG/0.5ML ~~LOC~~ SOAJ
3.0000 mg | SUBCUTANEOUS | 1 refills | Status: DC
  Filled 2024-06-13: qty 6, 84d supply, fill #0
  Filled 2024-06-30: qty 2, 28d supply, fill #0

## 2024-06-15 ENCOUNTER — Other Ambulatory Visit: Payer: Self-pay

## 2024-06-15 ENCOUNTER — Other Ambulatory Visit (HOSPITAL_COMMUNITY): Payer: Self-pay

## 2024-06-30 ENCOUNTER — Other Ambulatory Visit: Payer: Self-pay

## 2024-07-04 ENCOUNTER — Other Ambulatory Visit: Payer: Self-pay | Admitting: Medical Genetics

## 2024-07-04 ENCOUNTER — Other Ambulatory Visit: Payer: Self-pay

## 2024-07-04 DIAGNOSIS — Z006 Encounter for examination for normal comparison and control in clinical research program: Secondary | ICD-10-CM

## 2024-07-05 ENCOUNTER — Other Ambulatory Visit: Payer: Self-pay

## 2024-07-05 ENCOUNTER — Telehealth: Payer: Self-pay | Admitting: Family Medicine

## 2024-07-05 NOTE — Telephone Encounter (Signed)
"  °  Message on EPIC chat from Outpatient Pharmacy  Please forward to prior auth team itf appropriate.  Goodmorning, this patients TRULICITY  3MG  requires a PRIOR AUTH via COVERMYMEDS ([id number]) . The patient is aware it will take 3/4 work days to process. Thanks! what do I need to do? Go to covermymeds with that key number, fill out the doctors portion / extra information/ paperwork, and send to the insurance.   "

## 2024-07-06 ENCOUNTER — Other Ambulatory Visit (HOSPITAL_COMMUNITY): Payer: Self-pay

## 2024-07-06 ENCOUNTER — Other Ambulatory Visit: Payer: Self-pay

## 2024-07-06 ENCOUNTER — Telehealth: Payer: Self-pay

## 2024-07-06 NOTE — Telephone Encounter (Signed)
 Pharmacy Patient Advocate Encounter   Received notification from Physician's Office that prior authorization for Trulicity  3MG /0.5ML auto-injectors  is required/requested.   Insurance verification completed.   The patient is insured through [facility] ACA 2017.   Per test claim: PA required; PA started via CoverMyMeds. KEY [id number] . Waiting for clinical questions to populate.

## 2024-07-12 ENCOUNTER — Other Ambulatory Visit: Payer: Self-pay

## 2024-07-13 ENCOUNTER — Other Ambulatory Visit (HOSPITAL_COMMUNITY): Payer: Self-pay

## 2024-07-13 ENCOUNTER — Encounter (HOSPITAL_BASED_OUTPATIENT_CLINIC_OR_DEPARTMENT_OTHER): Payer: Self-pay | Admitting: Cardiology

## 2024-07-13 ENCOUNTER — Encounter: Payer: Self-pay | Admitting: Family Medicine

## 2024-07-13 DIAGNOSIS — Z9189 Other specified personal risk factors, not elsewhere classified: Secondary | ICD-10-CM

## 2024-07-13 NOTE — Telephone Encounter (Signed)
 Pharmacy Patient Advocate Encounter  Received notification from [facility] [employer] 2017 that Prior Authorization for Trulicity  3MG /0.5ML auto-injectors has been APPROVED from 07/11/2024 to 07/11/2025. Unable to obtain price due to refill too soon rejection, last fill date 07/12/2024 next available fill date 08/03/2024   PA #/Case ID/Reference #: [id number]

## 2024-07-14 ENCOUNTER — Encounter (HOSPITAL_BASED_OUTPATIENT_CLINIC_OR_DEPARTMENT_OTHER): Payer: Self-pay

## 2024-07-14 ENCOUNTER — Other Ambulatory Visit: Payer: Self-pay | Admitting: Family Medicine

## 2024-07-14 NOTE — Telephone Encounter (Signed)
 Pt has been scheduled for April Dr. Lonni.

## 2024-07-28 ENCOUNTER — Ambulatory Visit: Payer: Self-pay | Admitting: Family Medicine

## 2024-08-02 ENCOUNTER — Ambulatory Visit: Admitting: Family Medicine

## 2024-08-02 ENCOUNTER — Encounter: Payer: Self-pay | Admitting: Family Medicine

## 2024-08-02 ENCOUNTER — Other Ambulatory Visit: Payer: Self-pay

## 2024-08-02 ENCOUNTER — Ambulatory Visit
Admission: RE | Admit: 2024-08-02 | Discharge: 2024-08-02 | Disposition: A | Source: Ambulatory Visit | Attending: Family Medicine | Admitting: Family Medicine

## 2024-08-02 VITALS — BP 120/78 | HR 96 | Temp 98.5°F | Ht 68.75 in | Wt 281.0 lb

## 2024-08-02 DIAGNOSIS — M79645 Pain in left finger(s): Secondary | ICD-10-CM

## 2024-08-02 DIAGNOSIS — Z7985 Long-term (current) use of injectable non-insulin antidiabetic drugs: Secondary | ICD-10-CM | POA: Diagnosis not present

## 2024-08-02 DIAGNOSIS — E1159 Type 2 diabetes mellitus with other circulatory complications: Secondary | ICD-10-CM | POA: Diagnosis not present

## 2024-08-02 DIAGNOSIS — Z6841 Body Mass Index (BMI) 40.0 and over, adult: Secondary | ICD-10-CM

## 2024-08-02 LAB — MICROALBUMIN / CREATININE URINE RATIO
Creatinine,U: 232.3 mg/dL
Microalb Creat Ratio: 5.5 mg/g (ref 0.0–30.0)
Microalb, Ur: 1.3 mg/dL (ref 0.7–1.9)

## 2024-08-02 LAB — COMPREHENSIVE METABOLIC PANEL WITH GFR
ALT: 16 U/L (ref 3–35)
AST: 16 U/L (ref 5–37)
Albumin: 4.1 g/dL (ref 3.5–5.2)
Alkaline Phosphatase: 73 U/L (ref 39–117)
BUN: 14 mg/dL (ref 6–23)
CO2: 28 meq/L (ref 19–32)
Calcium: 9.7 mg/dL (ref 8.4–10.5)
Chloride: 105 meq/L (ref 96–112)
Creatinine, Ser: 0.8 mg/dL (ref 0.40–1.20)
GFR: 78.77 mL/min
Glucose, Bld: 116 mg/dL — ABNORMAL HIGH (ref 70–99)
Potassium: 3.6 meq/L (ref 3.5–5.1)
Sodium: 141 meq/L (ref 135–145)
Total Bilirubin: 0.9 mg/dL (ref 0.2–1.2)
Total Protein: 7.4 g/dL (ref 6.0–8.3)

## 2024-08-02 LAB — HEMOGLOBIN A1C: Hgb A1c MFr Bld: 6.6 % — ABNORMAL HIGH (ref 4.6–6.5)

## 2024-08-02 LAB — LIPID PANEL
Cholesterol: 123 mg/dL (ref 28–200)
HDL: 43.7 mg/dL
LDL Cholesterol: 50 mg/dL (ref 10–99)
NonHDL: 79.21
Total CHOL/HDL Ratio: 3
Triglycerides: 148 mg/dL (ref 10.0–149.0)
VLDL: 29.6 mg/dL (ref 0.0–40.0)

## 2024-08-02 MED ORDER — TRULICITY 4.5 MG/0.5ML ~~LOC~~ SOAJ
4.5000 mg | SUBCUTANEOUS | 0 refills | Status: AC
  Filled 2024-08-02: qty 2, 28d supply, fill #0

## 2024-08-02 NOTE — Progress Notes (Signed)
 "   Patient ID: Christine Barrett, female    DOB: 02-Mar-1961, 64 y.o.   MRN: [id number]  This visit was conducted in person.  BP 120/78   Pulse 96   Temp 98.5 F (36.9 C) (Oral)   Ht 5' 8.75 (1.746 m)   Wt 281 lb (127.5 kg)   SpO2 97%   BMI 41.80 kg/m    CC:  Chief Complaint  Patient presents with   Medical Management of Chronic Issues    Pt CC is L Pinky finger pain x . Pain feels like a sharpe /nerve pain. Pain is rated a  5/10. Not due to an injury.  Pain comes and goes. No swelling.  No other symptoms.    Subjective:   HPI: Christine Barrett is a 64 y.o. female presenting on 08/02/2024 for Medical Management of Chronic Issues (Pt CC is L Pinky finger pain x . Pain feels like a sharpe /nerve pain. Pain is rated a  5/10. Not due to an injury.  Pain comes and goes. No swelling.  No other symptoms.)  Left pinky finger pain x 6 months   Sore over  lateral DIP joint.   5/10 on pain scale  No known injury.  No swelling.   Pain at distal finger.. radiates  to down finger.    Right handed.   Sharp pain like hitting funny bone if she hits finger.     Lab Results  Component Value Date   HGBA1C 6.2 11/16/2023  She is not having any weight loss with Trulicity  on 3 mg in last month... Mounjaro  was helping with appetite more.   Now has  different insurance. Wt Readings from Last 3 Encounters:  08/02/24 281 lb (127.5 kg)  11/23/23 266 lb (120.7 kg)  11/09/23 263 lb (119.3 kg)      Relevant past medical, surgical, family and social history reviewed and updated as indicated. Interim medical history since our last visit reviewed. Allergies and medications reviewed and updated. Outpatient Medications Prior to Visit  Medication Sig Dispense Refill   albuterol  (VENTOLIN  HFA) 108 (90 Base) MCG/ACT inhaler Inhale 2 puffs into the lungs every 4 (four) hours as needed for wheezing or shortness of breath (bronchospasms). 18 g 0   ascorbic acid (VITAMIN C) 500 MG tablet Take 500 mg by  mouth daily.     ASPIRIN  LOW DOSE 81 MG chewable tablet CHEW 1 TABLET (81 MG TOTAL) BY MOUTH 2 (TWO) TIMES DAILY. TO PREVENT BLOOD CLOTS 180 tablet 1   BLACK ELDERBERRY PO Take 2 each by mouth daily.     Blood Glucose Monitoring Suppl (ONETOUCH VERIO) w/Device KIT Use to check blood sugar 2 times a day 1 kit 0   carvedilol  (COREG ) 25 MG tablet Take 1 tablet (25 mg total) by mouth 2 (two) times daily with a meal. 60 tablet 11   Cholecalciferol (VITAMIN D3 PO) Take 1 tablet by mouth daily.     ezetimibe  (ZETIA ) 10 MG tablet Take 1 tablet (10 mg total) by mouth daily. 90 tablet 3   famotidine  (PEPCID ) 20 MG tablet Take 1 tablet (20 mg total) by mouth daily. 90 tablet 3   fluticasone  (FLONASE ) 50 MCG/ACT nasal spray Place 2 sprays into both nostrils daily. Shake well before use. Gently blow nose before spraying. Do not blow nose immediately after use. You should not taste the medication or feel it going down your throat; if you do, adjust your technique. 16 g 0   glucose blood (  ONETOUCH VERIO) test strip Use to check blood sugar 2 times a day 200 strip 3   hydrochlorothiazide  (HYDRODIURIL ) 25 MG tablet Take 1 tablet (25 mg total) by mouth daily. 90 tablet 3   isosorbide  mononitrate (IMDUR ) 60 MG 24 hr tablet Take 1 tablet (60 mg total) by mouth daily. 90 tablet 1   Lancet Devices (ONE TOUCH DELICA LANCING DEV) MISC Use to check blood sugar 2 times a day 1 each 0   Lancets (ONETOUCH DELICA PLUS LANCET30G) MISC USE TO CHECK BLOOD SUGAR 2 TIMES A DAY 200 each 3   latanoprost (XALATAN) 0.005 % ophthalmic solution Place 1 drop into both eyes at bedtime.      losartan  (COZAAR ) 100 MG tablet Take 1 tablet (100 mg total) by mouth daily. 90 tablet 3   montelukast  (SINGULAIR ) 10 MG tablet Take 1 tablet (10 mg total) by mouth at bedtime. 90 tablet 3   Multiple Vitamin (MULTIVITAMIN WITH MINERALS) TABS tablet Take 1 tablet by mouth daily.     pantoprazole  (PROTONIX ) 40 MG tablet Take 1 tablet (40 mg total) by  mouth daily. 90 tablet 3   promethazine -dextromethorphan (PROMETHAZINE -DM) 6.25-15 MG/5ML syrup Take 10 mLs by mouth every 6 (six) hours as needed for cough. 118 mL 0   rosuvastatin  (CRESTOR ) 20 MG tablet Take 1 tablet (20 mg total) by mouth daily. 90 tablet 3   timolol (TIMOPTIC) 0.5 % ophthalmic solution Place 1 drop into both eyes every morning.     zinc gluconate 50 MG tablet Take 50 mg by mouth daily.     Dulaglutide  (TRULICITY ) 3 MG/0.5ML SOAJ Inject 3 mg as directed once a week. 6 mL 1   azithromycin  (ZITHROMAX ) 250 MG tablet Take 1 tablet (250 mg total) by mouth daily. Take first 2 tablets together, then 1 every day until finished. (Patient not taking: Reported on 08/02/2024) 6 tablet 0   No facility-administered medications prior to visit.     Per HPI unless specifically indicated in ROS section below Review of Systems  Constitutional:  Negative for fatigue and fever.  HENT:  Negative for congestion.   Eyes:  Negative for pain.  Respiratory:  Negative for cough and shortness of breath.   Cardiovascular:  Negative for chest pain, palpitations and leg swelling.  Gastrointestinal:  Negative for abdominal pain.  Genitourinary:  Negative for dysuria and vaginal bleeding.  Musculoskeletal:  Negative for back pain.  Neurological:  Negative for syncope, light-headedness and headaches.  Psychiatric/Behavioral:  Negative for dysphoric mood.    Objective:  BP 120/78   Pulse 96   Temp 98.5 F (36.9 C) (Oral)   Ht 5' 8.75 (1.746 m)   Wt 281 lb (127.5 kg)   SpO2 97%   BMI 41.80 kg/m   Wt Readings from Last 3 Encounters:  08/02/24 281 lb (127.5 kg)  11/23/23 266 lb (120.7 kg)  11/09/23 263 lb (119.3 kg)      Physical Exam Constitutional:      General: She is not in acute distress.    Appearance: Normal appearance. She is well-developed. She is not ill-appearing or toxic-appearing.  HENT:     Head: Normocephalic.     Right Ear: Hearing normal. Tympanic membrane is not  erythematous, retracted or bulging.     Left Ear: Hearing and ear canal normal. Tympanic membrane is not erythematous, retracted or bulging.     Nose: No mucosal edema.     Right Sinus: No maxillary sinus tenderness or frontal sinus tenderness.  Left Sinus: No maxillary sinus tenderness or frontal sinus tenderness.     Mouth/Throat:     Pharynx: Uvula midline.  Eyes:     General: Lids are normal. Lids are everted, no foreign bodies appreciated.     Conjunctiva/sclera: Conjunctivae normal.     Pupils: Pupils are equal, round, and reactive to light.  Neck:     Thyroid: No thyroid mass or thyromegaly.     Vascular: No carotid bruit.     Trachea: Trachea normal.  Cardiovascular:     Rate and Rhythm: Normal rate and regular rhythm.     Pulses: Normal pulses.     Heart sounds: Normal heart sounds, S1 normal and S2 normal. No murmur heard.    No friction rub. No gallop.  Pulmonary:     Effort: Pulmonary effort is normal. No tachypnea or respiratory distress.     Breath sounds: Normal breath sounds. No decreased breath sounds, wheezing, rhonchi or rales.  Musculoskeletal:     Right hand: Normal.     Left hand: Tenderness present. No swelling, deformity, lacerations or bony tenderness. Normal range of motion. Normal strength. Normal sensation. There is no disruption of two-point discrimination. Normal capillary refill. Normal pulse.     Cervical back: Normal range of motion and neck supple.     Comments:  Ttp on squeeze of little finger DIP and PIP joint  Mildly positive ulnar neuropathy compression  Skin:    General: Skin is warm and dry.     Findings: No rash.  Neurological:     Mental Status: She is alert.  Psychiatric:        Mood and Affect: Mood is not anxious or depressed.        Speech: Speech normal.        Behavior: Behavior normal. Behavior is cooperative.        Thought Content: Thought content normal.        Judgment: Judgment normal.       Results for orders placed  or performed during the hospital encounter of 04/23/24  POC SARS Coronavirus Ag   Collection Time: 04/23/24  3:31 PM  Result Value Ref Range   SARS Coronavirus 2 Ag Negative Negative    Assessment and Plan  Finger pain, left Assessment & Plan: Chronic is now going on 6 months or more.  She is sore over the joints itself but has some symptoms consistent with ulnar neuropathy.  She is reproduced numbness on the left and not the right with compression of the ulnar nerve. Will evaluate finger with x-rays to determine if there is osteoarthritis going on in the joints. I instructed her to watch her day-to-day routine and determine if she is putting any pressure on her elbow.  She can consider wrapping her left elbow with a towel and tape to limit compression of ulnar nerve at night.  Orders: -     DG Finger Little Left; Future  Morbid obesity (HCC) Assessment & Plan: Chronic, she has had no decrease in appetite and 20 pound weight gain despite Trulicity .  There was no decrease in appetite or any weight loss with increasing to 3 mg.  We will max the Trulicity  out to 4.5 mg weekly.  If there is no weight loss after a month we will change back to Mounjaro  which was helping with appetite and weight loss as well as diabetes. She is willing to tolerate any side effect of diarrhea.   Trulicity  4.5 mg weekly  Type 2 diabetes mellitus with other circulatory complications, HTN, CVD (HCC) Assessment & Plan:  Due for re-eval.  Due for microalbumin.  Orders: -     Hemoglobin A1c -     Lipid panel -     Comprehensive metabolic panel with GFR -     Microalbumin / creatinine urine ratio  Other orders -     Trulicity ; Inject 4.5 mg into the skin once a week.  Dispense: 2 mL; Refill: 0    No follow-ups on file.   Greig Ring, MD  "

## 2024-08-02 NOTE — Assessment & Plan Note (Signed)
 Chronic is now going on 6 months or more.  She is sore over the joints itself but has some symptoms consistent with ulnar neuropathy.  She is reproduced numbness on the left and not the right with compression of the ulnar nerve. Will evaluate finger with x-rays to determine if there is osteoarthritis going on in the joints. I instructed her to watch her day-to-day routine and determine if she is putting any pressure on her elbow.  She can consider wrapping her left elbow with a towel and tape to limit compression of ulnar nerve at night.

## 2024-08-02 NOTE — Assessment & Plan Note (Signed)
"   Due for re-eval.  Due for microalbumin. "

## 2024-08-02 NOTE — Assessment & Plan Note (Signed)
 Chronic, she has had no decrease in appetite and 20 pound weight gain despite Trulicity .  There was no decrease in appetite or any weight loss with increasing to 3 mg.  We will max the Trulicity  out to 4.5 mg weekly.  If there is no weight loss after a month we will change back to Mounjaro  which was helping with appetite and weight loss as well as diabetes. She is willing to tolerate any side effect of diarrhea.   Trulicity  4.5 mg weekly

## 2024-08-03 ENCOUNTER — Other Ambulatory Visit: Payer: Self-pay

## 2024-08-03 ENCOUNTER — Ambulatory Visit: Payer: Self-pay | Admitting: Family Medicine

## 2024-09-02 ENCOUNTER — Other Ambulatory Visit

## 2024-10-10 ENCOUNTER — Ambulatory Visit (HOSPITAL_BASED_OUTPATIENT_CLINIC_OR_DEPARTMENT_OTHER): Payer: Self-pay | Admitting: Cardiology

## 2024-11-23 ENCOUNTER — Encounter: Payer: Self-pay | Admitting: Family Medicine
